# Patient Record
Sex: Female | Born: 1960
Health system: Southern US, Community
[De-identification: ages and names within clinical notes are randomized; demographics above are authoritative.]

## PROBLEM LIST (undated history)

## (undated) DIAGNOSIS — K219 Gastro-esophageal reflux disease without esophagitis: Secondary | ICD-10-CM

## (undated) DIAGNOSIS — F1410 Cocaine abuse, uncomplicated: Secondary | ICD-10-CM

## (undated) DIAGNOSIS — Z7289 Other problems related to lifestyle: Secondary | ICD-10-CM

## (undated) DIAGNOSIS — G629 Polyneuropathy, unspecified: Secondary | ICD-10-CM

## (undated) DIAGNOSIS — F32A Depression, unspecified: Secondary | ICD-10-CM

## (undated) DIAGNOSIS — Z720 Tobacco use: Secondary | ICD-10-CM

## (undated) DIAGNOSIS — F410 Panic disorder [episodic paroxysmal anxiety] without agoraphobia: Secondary | ICD-10-CM

## (undated) DIAGNOSIS — I499 Cardiac arrhythmia, unspecified: Secondary | ICD-10-CM

## (undated) DIAGNOSIS — M1990 Unspecified osteoarthritis, unspecified site: Secondary | ICD-10-CM

## (undated) DIAGNOSIS — F419 Anxiety disorder, unspecified: Secondary | ICD-10-CM

## (undated) DIAGNOSIS — J449 Chronic obstructive pulmonary disease, unspecified: Secondary | ICD-10-CM

## (undated) DIAGNOSIS — I480 Paroxysmal atrial fibrillation: Secondary | ICD-10-CM

## (undated) DIAGNOSIS — F1090 Alcohol use, unspecified, uncomplicated: Secondary | ICD-10-CM

## (undated) DIAGNOSIS — K5900 Constipation, unspecified: Secondary | ICD-10-CM

## (undated) DIAGNOSIS — E559 Vitamin D deficiency, unspecified: Secondary | ICD-10-CM

## (undated) DIAGNOSIS — F329 Major depressive disorder, single episode, unspecified: Secondary | ICD-10-CM

## (undated) DIAGNOSIS — Z789 Other specified health status: Secondary | ICD-10-CM

## (undated) DIAGNOSIS — I10 Essential (primary) hypertension: Secondary | ICD-10-CM

## (undated) DIAGNOSIS — J189 Pneumonia, unspecified organism: Secondary | ICD-10-CM

## (undated) DIAGNOSIS — E119 Type 2 diabetes mellitus without complications: Secondary | ICD-10-CM

## (undated) HISTORY — DX: Vitamin D deficiency, unspecified: E55.9

## (undated) HISTORY — DX: Cardiac arrhythmia, unspecified: I49.9

## (undated) HISTORY — PX: NO PAST SURGERIES: SHX2092

---

## 2004-05-19 ENCOUNTER — Emergency Department: Payer: Self-pay | Admitting: Emergency Medicine

## 2004-10-17 ENCOUNTER — Emergency Department: Payer: Self-pay | Admitting: Unknown Physician Specialty

## 2004-10-21 ENCOUNTER — Emergency Department: Payer: Self-pay | Admitting: Emergency Medicine

## 2004-10-23 ENCOUNTER — Emergency Department: Payer: Self-pay | Admitting: Emergency Medicine

## 2005-08-05 ENCOUNTER — Emergency Department: Payer: Self-pay | Admitting: Internal Medicine

## 2006-10-05 ENCOUNTER — Ambulatory Visit: Payer: Self-pay | Admitting: Nurse Practitioner

## 2006-10-07 ENCOUNTER — Ambulatory Visit: Payer: Self-pay | Admitting: *Deleted

## 2008-03-16 ENCOUNTER — Inpatient Hospital Stay: Payer: Self-pay | Admitting: Internal Medicine

## 2008-03-16 ENCOUNTER — Other Ambulatory Visit: Payer: Self-pay

## 2010-03-18 ENCOUNTER — Inpatient Hospital Stay: Payer: Self-pay | Admitting: Internal Medicine

## 2011-10-28 ENCOUNTER — Ambulatory Visit: Payer: Self-pay | Admitting: Primary Care

## 2012-09-21 ENCOUNTER — Emergency Department: Payer: Self-pay | Admitting: Emergency Medicine

## 2015-06-21 LAB — HM PAP SMEAR

## 2015-08-15 ENCOUNTER — Ambulatory Visit: Payer: Self-pay | Attending: Oncology

## 2016-04-07 ENCOUNTER — Ambulatory Visit
Admission: RE | Admit: 2016-04-07 | Discharge: 2016-04-07 | Disposition: A | Discharge status: Home / Self Care | Payer: Self-pay | Source: Ambulatory Visit | Attending: Oncology | Admitting: Oncology

## 2016-04-07 ENCOUNTER — Encounter (INDEPENDENT_AMBULATORY_CARE_PROVIDER_SITE_OTHER): Payer: Self-pay

## 2016-04-07 ENCOUNTER — Ambulatory Visit: Payer: Self-pay | Attending: Oncology

## 2016-04-07 VITALS — BP 138/77 | HR 97 | Temp 98.2°F | Ht 62.6 in | Wt 126.8 lb

## 2016-04-07 DIAGNOSIS — Z0000 Encounter for general adult medical examination without abnormal findings: Secondary | ICD-10-CM

## 2016-04-07 NOTE — Progress Notes (Signed)
Letter mailed from [facility] to notify of normal mammogram results.  Patient to return in one year for annual screening.  Copy to [facility]. 

## 2016-04-07 NOTE — Progress Notes (Signed)
Subjective:     Patient ID: Michaela Johnson, female   DOB: 1960/09/08, 55 y.o.   MRN: [id number]  HPI   Review of Systems     Objective:   Physical Exam  Pulmonary/Chest: Right breast exhibits no inverted nipple, no mass, no nipple discharge, no skin change and no tenderness. Left breast exhibits no inverted nipple, no mass, no nipple discharge, no skin change and no tenderness. Breasts are symmetrical.    Bilateral areolar indentation 6 o'clock when arms raised       Assessment:     55 year old patient with no previous mammogram. Patient screened, and meets BCCCP eligibility.  Patient does not have insurance, Medicare or Medicaid.  Handout given on Affordable Care Act.  Instructed patient on breast self-exam using teach back method.  CBE unremarkable.  No mass or lump palpated.  Noted lower areolar inversion bilateral when arms raised.  Patient states "has always done that."    Plan:     Sent for bilateral screening mammogram.

## 2016-05-05 DIAGNOSIS — I152 Hypertension secondary to endocrine disorders: Secondary | ICD-10-CM | POA: Diagnosis present

## 2016-05-05 DIAGNOSIS — I10 Essential (primary) hypertension: Secondary | ICD-10-CM | POA: Diagnosis present

## 2016-05-05 DIAGNOSIS — E1159 Type 2 diabetes mellitus with other circulatory complications: Secondary | ICD-10-CM | POA: Diagnosis present

## 2016-05-14 ENCOUNTER — Other Ambulatory Visit: Payer: Self-pay | Admitting: Internal Medicine

## 2016-05-14 DIAGNOSIS — R748 Abnormal levels of other serum enzymes: Secondary | ICD-10-CM

## 2016-06-07 ENCOUNTER — Encounter: Payer: Self-pay | Admitting: *Deleted

## 2016-06-07 ENCOUNTER — Inpatient Hospital Stay: Payer: Self-pay

## 2016-06-07 ENCOUNTER — Inpatient Hospital Stay
Admission: EM | Admit: 2016-06-07 | Discharge: 2016-06-10 | DRG: 637 | Disposition: A | Discharge status: Home / Self Care | Payer: Self-pay | Attending: Internal Medicine | Admitting: Internal Medicine

## 2016-06-07 DIAGNOSIS — Z794 Long term (current) use of insulin: Secondary | ICD-10-CM

## 2016-06-07 DIAGNOSIS — I10 Essential (primary) hypertension: Secondary | ICD-10-CM | POA: Diagnosis present

## 2016-06-07 DIAGNOSIS — K852 Alcohol induced acute pancreatitis without necrosis or infection: Secondary | ICD-10-CM | POA: Diagnosis present

## 2016-06-07 DIAGNOSIS — Z91128 Patient's intentional underdosing of medication regimen for other reason: Secondary | ICD-10-CM

## 2016-06-07 DIAGNOSIS — N179 Acute kidney failure, unspecified: Secondary | ICD-10-CM | POA: Diagnosis present

## 2016-06-07 DIAGNOSIS — R000 Tachycardia, unspecified: Secondary | ICD-10-CM | POA: Diagnosis not present

## 2016-06-07 DIAGNOSIS — Y92019 Unspecified place in single-family (private) house as the place of occurrence of the external cause: Secondary | ICD-10-CM

## 2016-06-07 DIAGNOSIS — F10288 Alcohol dependence with other alcohol-induced disorder: Secondary | ICD-10-CM | POA: Diagnosis present

## 2016-06-07 DIAGNOSIS — Z8249 Family history of ischemic heart disease and other diseases of the circulatory system: Secondary | ICD-10-CM

## 2016-06-07 DIAGNOSIS — I447 Left bundle-branch block, unspecified: Secondary | ICD-10-CM | POA: Diagnosis present

## 2016-06-07 DIAGNOSIS — E875 Hyperkalemia: Secondary | ICD-10-CM | POA: Diagnosis present

## 2016-06-07 DIAGNOSIS — F329 Major depressive disorder, single episode, unspecified: Secondary | ICD-10-CM | POA: Diagnosis present

## 2016-06-07 DIAGNOSIS — Z79899 Other long term (current) drug therapy: Secondary | ICD-10-CM

## 2016-06-07 DIAGNOSIS — R110 Nausea: Secondary | ICD-10-CM

## 2016-06-07 DIAGNOSIS — Z23 Encounter for immunization: Secondary | ICD-10-CM

## 2016-06-07 DIAGNOSIS — Z716 Tobacco abuse counseling: Secondary | ICD-10-CM

## 2016-06-07 DIAGNOSIS — F17200 Nicotine dependence, unspecified, uncomplicated: Secondary | ICD-10-CM | POA: Diagnosis present

## 2016-06-07 DIAGNOSIS — R1011 Right upper quadrant pain: Secondary | ICD-10-CM

## 2016-06-07 DIAGNOSIS — E1110 Type 2 diabetes mellitus with ketoacidosis without coma: Secondary | ICD-10-CM | POA: Diagnosis present

## 2016-06-07 DIAGNOSIS — K219 Gastro-esophageal reflux disease without esophagitis: Secondary | ICD-10-CM | POA: Diagnosis present

## 2016-06-07 DIAGNOSIS — E1010 Type 1 diabetes mellitus with ketoacidosis without coma: Principal | ICD-10-CM | POA: Diagnosis present

## 2016-06-07 DIAGNOSIS — R109 Unspecified abdominal pain: Secondary | ICD-10-CM

## 2016-06-07 DIAGNOSIS — T383X6A Underdosing of insulin and oral hypoglycemic [antidiabetic] drugs, initial encounter: Secondary | ICD-10-CM | POA: Diagnosis present

## 2016-06-07 HISTORY — DX: Type 2 diabetes mellitus without complications: E11.9

## 2016-06-07 HISTORY — DX: Essential (primary) hypertension: I10

## 2016-06-07 LAB — CBC
HCT: 36.6 % (ref 35.0–47.0)
HEMATOCRIT: 37.7 % (ref 35.0–47.0)
HEMOGLOBIN: 11.2 g/dL — AB (ref 12.0–16.0)
Hemoglobin: 10.8 g/dL — ABNORMAL LOW (ref 12.0–16.0)
MCH: 35.7 pg — AB (ref 26.0–34.0)
MCH: 35.3 pg — ABNORMAL HIGH (ref 26.0–34.0)
MCHC: 29.8 g/dL — AB (ref 32.0–36.0)
MCHC: 29.5 g/dL — AB (ref 32.0–36.0)
MCV: 119.7 fL — ABNORMAL HIGH (ref 80.0–100.0)
MCV: 119.9 fL — ABNORMAL HIGH (ref 80.0–100.0)
PLATELETS: 233 10*3/uL (ref 150–440)
Platelets: 247 10*3/uL (ref 150–440)
RBC: 3.15 MIL/uL — ABNORMAL LOW (ref 3.80–5.20)
RBC: 3.06 MIL/uL — ABNORMAL LOW (ref 3.80–5.20)
RDW: 14.7 % — ABNORMAL HIGH (ref 11.5–14.5)
RDW: 14.7 % — AB (ref 11.5–14.5)
WBC: 14.8 10*3/uL — ABNORMAL HIGH (ref 3.6–11.0)
WBC: 14.3 10*3/uL — AB (ref 3.6–11.0)

## 2016-06-07 LAB — TROPONIN I: Troponin I: <0.03 ng/mL (ref <0.03)

## 2016-06-07 LAB — COMPREHENSIVE METABOLIC PANEL
ALK PHOS: 122 U/L (ref 38–126)
ALT: 34 U/L (ref 14–54)
ANION GAP: UNDETERMINED (ref 5–15)
AST: 48 U/L — ABNORMAL HIGH (ref 15–41)
Albumin: 3.9 g/dL (ref 3.5–5.0)
BILIRUBIN TOTAL: 2.1 mg/dL — AB (ref 0.3–1.2)
BUN: 28 mg/dL — ABNORMAL HIGH (ref 6–20)
CALCIUM: 8.6 mg/dL — AB (ref 8.9–10.3)
CO2: <7 mmol/L — ABNORMAL LOW (ref 22–32)
Chloride: 82 mmol/L — ABNORMAL LOW (ref 101–111)
Creatinine, Ser: 1.5 mg/dL — ABNORMAL HIGH (ref 0.44–1.00)
GFR, EST AFRICAN AMERICAN: 44 mL/min — AB (ref >60)
GFR, EST NON AFRICAN AMERICAN: 38 mL/min — AB (ref >60)
GLUCOSE: 893 mg/dL — AB (ref 65–99)
Potassium: 6.1 mmol/L — ABNORMAL HIGH (ref 3.5–5.1)
Sodium: 118 mmol/L — CL (ref 135–145)
TOTAL PROTEIN: 7.9 g/dL (ref 6.5–8.1)

## 2016-06-07 LAB — BASIC METABOLIC PANEL
ANION GAP: 23 — AB (ref 5–15)
BUN: 28 mg/dL — ABNORMAL HIGH (ref 6–20)
BUN: 29 mg/dL — ABNORMAL HIGH (ref 6–20)
CALCIUM: 7.7 mg/dL — AB (ref 8.9–10.3)
CALCIUM: 7.3 mg/dL — AB (ref 8.9–10.3)
CO2: 8 mmol/L — AB (ref 22–32)
CREATININE: 1.41 mg/dL — AB (ref 0.44–1.00)
CREATININE: 1.57 mg/dL — AB (ref 0.44–1.00)
Chloride: 89 mmol/L — ABNORMAL LOW (ref 101–111)
Chloride: 96 mmol/L — ABNORMAL LOW (ref 101–111)
GFR, EST AFRICAN AMERICAN: 47 mL/min — AB (ref >60)
GFR, EST AFRICAN AMERICAN: 41 mL/min — AB (ref >60)
GFR, EST NON AFRICAN AMERICAN: 40 mL/min — AB (ref >60)
GFR, EST NON AFRICAN AMERICAN: 36 mL/min — AB (ref >60)
Glucose, Bld: 882 mg/dL (ref 65–99)
Glucose, Bld: 351 mg/dL — ABNORMAL HIGH (ref 65–99)
Potassium: 5.9 mmol/L — ABNORMAL HIGH (ref 3.5–5.1)
Potassium: 3.3 mmol/L — ABNORMAL LOW (ref 3.5–5.1)
SODIUM: 122 mmol/L — AB (ref 135–145)
SODIUM: 127 mmol/L — AB (ref 135–145)

## 2016-06-07 LAB — GLUCOSE, CAPILLARY
GLUCOSE-CAPILLARY: 259 mg/dL — AB (ref 65–99)
Glucose-Capillary: 195 mg/dL — ABNORMAL HIGH (ref 65–99)
Glucose-Capillary: 405 mg/dL — ABNORMAL HIGH (ref 65–99)
Glucose-Capillary: 480 mg/dL — ABNORMAL HIGH (ref 65–99)
Glucose-Capillary: 508 mg/dL (ref 65–99)
Glucose-Capillary: >600 mg/dL (ref 65–99)
Glucose-Capillary: >600 mg/dL (ref 65–99)
Glucose-Capillary: >600 mg/dL (ref 65–99)
Glucose-Capillary: >600 mg/dL (ref 65–99)

## 2016-06-07 LAB — LIPASE, BLOOD: LIPASE: 639 U/L — AB (ref 11–51)

## 2016-06-07 LAB — MRSA PCR SCREENING: MRSA by PCR: NEGATIVE

## 2016-06-07 MED ORDER — PANTOPRAZOLE SODIUM 40 MG PO TBEC
40.0000 mg | DELAYED_RELEASE_TABLET | Freq: Every day | ORAL | Status: DC
  Administered 2016-06-08 – 2016-06-10 (×3): 40 mg via ORAL
  Filled 2016-06-07 (×3): qty 1

## 2016-06-07 MED ORDER — MORPHINE SULFATE (PF) 4 MG/ML IV SOLN: 1.0000 mg | INTRAVENOUS | Status: DC | PRN

## 2016-06-07 MED ORDER — CITALOPRAM HYDROBROMIDE 20 MG PO TABS
20.0000 mg | ORAL_TABLET | Freq: Every day | ORAL | Status: DC
  Administered 2016-06-08 – 2016-06-10 (×3): 20 mg via ORAL
  Filled 2016-06-07 (×3): qty 1

## 2016-06-07 MED ORDER — DILTIAZEM LOAD VIA INFUSION
10.0000 mg | Freq: Once | INTRAVENOUS | Status: AC
  Administered 2016-06-07: 10 mg via INTRAVENOUS
  Filled 2016-06-07: qty 10

## 2016-06-07 MED ORDER — LORAZEPAM 0.5 MG PO TABS: 1.0000 mg | ORAL_TABLET | Freq: Four times a day (QID) | ORAL | Status: DC | PRN

## 2016-06-07 MED ORDER — SODIUM CHLORIDE 0.9% FLUSH
3.0000 mL | Freq: Two times a day (BID) | INTRAVENOUS | Status: DC
  Administered 2016-06-07 – 2016-06-10 (×6): 3 mL via INTRAVENOUS

## 2016-06-07 MED ORDER — LORAZEPAM 2 MG PO TABS: 0.0000 mg | ORAL_TABLET | Freq: Two times a day (BID) | ORAL | Status: DC

## 2016-06-07 MED ORDER — DILTIAZEM HCL 100 MG IV SOLR
5.0000 mg/h | INTRAVENOUS | Status: DC
  Administered 2016-06-07 – 2016-06-09 (×2): 5 mg/h via INTRAVENOUS
  Filled 2016-06-07 (×3): qty 100

## 2016-06-07 MED ORDER — ADULT MULTIVITAMIN W/MINERALS CH
1.0000 | ORAL_TABLET | Freq: Every day | ORAL | Status: DC
  Administered 2016-06-08 – 2016-06-10 (×3): 1 via ORAL
  Filled 2016-06-07 (×3): qty 1

## 2016-06-07 MED ORDER — SODIUM CHLORIDE 0.9 % IV SOLN
INTRAVENOUS | Status: DC
Start: 2016-06-07 — End: 2016-06-08
  Administered 2016-06-07 (×2): via INTRAVENOUS

## 2016-06-07 MED ORDER — SODIUM CHLORIDE 0.9 % IV BOLUS (SEPSIS)
1000.0000 mL | Freq: Once | INTRAVENOUS | Status: AC
  Administered 2016-06-07: 1000 mL via INTRAVENOUS

## 2016-06-07 MED ORDER — ENOXAPARIN SODIUM 40 MG/0.4ML ~~LOC~~ SOLN
40.0000 mg | SUBCUTANEOUS | Status: DC
  Administered 2016-06-07 – 2016-06-09 (×3): 40 mg via SUBCUTANEOUS
  Filled 2016-06-07 (×3): qty 0.4

## 2016-06-07 MED ORDER — IOPAMIDOL (ISOVUE-300) INJECTION 61%
75.0000 mL | Freq: Once | INTRAVENOUS | Status: AC | PRN
  Administered 2016-06-07: 75 mL via INTRAVENOUS

## 2016-06-07 MED ORDER — SODIUM CHLORIDE 0.9 % IV SOLN: INTRAVENOUS | Status: AC

## 2016-06-07 MED ORDER — ONDANSETRON HCL 4 MG/2ML IJ SOLN
4.0000 mg | Freq: Four times a day (QID) | INTRAMUSCULAR | Status: DC | PRN
  Administered 2016-06-07: 4 mg via INTRAVENOUS
  Filled 2016-06-07: qty 2

## 2016-06-07 MED ORDER — STERILE WATER FOR INJECTION IV SOLN
INTRAVENOUS | Status: DC
  Administered 2016-06-07 (×2): via INTRAVENOUS
  Filled 2016-06-07 (×6): qty 850

## 2016-06-07 MED ORDER — LORAZEPAM 2 MG/ML IJ SOLN
1.0000 mg | Freq: Four times a day (QID) | INTRAMUSCULAR | Status: DC | PRN
  Administered 2016-06-08: 1 mg via INTRAVENOUS
  Filled 2016-06-07: qty 1

## 2016-06-07 MED ORDER — SODIUM CHLORIDE 0.9 % IV SOLN
INTRAVENOUS | Status: DC
  Administered 2016-06-07: 5.4 [IU]/h via INTRAVENOUS
  Filled 2016-06-07: qty 2.5

## 2016-06-07 MED ORDER — SODIUM CHLORIDE 0.9 % IV SOLN
INTRAVENOUS | Status: DC
  Administered 2016-06-07: 16.2 [IU]/h via INTRAVENOUS
  Administered 2016-06-08: 3.9 [IU]/h via INTRAVENOUS
  Filled 2016-06-07: qty 2.5

## 2016-06-07 MED ORDER — VITAMIN B-1 100 MG PO TABS
100.0000 mg | ORAL_TABLET | Freq: Every day | ORAL | Status: DC
  Administered 2016-06-08 – 2016-06-10 (×3): 100 mg via ORAL
  Filled 2016-06-07 (×3): qty 1

## 2016-06-07 MED ORDER — ACETAMINOPHEN 325 MG PO TABS: 650.0000 mg | ORAL_TABLET | Freq: Four times a day (QID) | ORAL | Status: DC | PRN

## 2016-06-07 MED ORDER — ACETAMINOPHEN 650 MG RE SUPP: 650.0000 mg | Freq: Four times a day (QID) | RECTAL | Status: DC | PRN

## 2016-06-07 MED ORDER — THIAMINE HCL 100 MG/ML IJ SOLN
100.0000 mg | Freq: Every day | INTRAMUSCULAR | Status: DC
  Filled 2016-06-07 (×3): qty 2

## 2016-06-07 MED ORDER — PIPERACILLIN-TAZOBACTAM 3.375 G IVPB
3.3750 g | Freq: Three times a day (TID) | INTRAVENOUS | Status: DC
  Administered 2016-06-07 – 2016-06-09 (×6): 3.375 g via INTRAVENOUS
  Filled 2016-06-07 (×6): qty 50

## 2016-06-07 MED ORDER — SODIUM BICARBONATE 8.4 % IV SOLN
INTRAVENOUS | Status: AC
  Administered 2016-06-07: 50 meq via INTRAVENOUS
  Filled 2016-06-07: qty 50

## 2016-06-07 MED ORDER — SODIUM CHLORIDE 0.9 % IV BOLUS (SEPSIS)
1000.0000 mL | Freq: Once | INTRAVENOUS | Status: AC
Start: 2016-06-07 — End: 2016-06-07
  Administered 2016-06-07: 1000 mL via INTRAVENOUS

## 2016-06-07 MED ORDER — DIGOXIN 0.25 MG/ML IJ SOLN
0.2500 mg | Freq: Once | INTRAMUSCULAR | Status: AC
Start: 2016-06-07 — End: 2016-06-07
  Administered 2016-06-07: 0.25 mg via INTRAVENOUS
  Filled 2016-06-07: qty 2

## 2016-06-07 MED ORDER — ENOXAPARIN SODIUM 40 MG/0.4ML ~~LOC~~ SOLN: 40.0000 mg | SUBCUTANEOUS | Status: DC

## 2016-06-07 MED ORDER — FOLIC ACID 1 MG PO TABS
1.0000 mg | ORAL_TABLET | Freq: Every day | ORAL | Status: DC
  Administered 2016-06-08 – 2016-06-10 (×3): 1 mg via ORAL
  Filled 2016-06-07 (×3): qty 1

## 2016-06-07 MED ORDER — DEXTROSE-NACL 5-0.45 % IV SOLN
INTRAVENOUS | Status: DC
Start: 2016-06-07 — End: 2016-06-07

## 2016-06-07 MED ORDER — DEXTROSE-NACL 5-0.45 % IV SOLN
INTRAVENOUS | Status: DC
  Administered 2016-06-08: 03:00:00 via INTRAVENOUS

## 2016-06-07 MED ORDER — TRAMADOL HCL 50 MG PO TABS: 50.0000 mg | ORAL_TABLET | Freq: Four times a day (QID) | ORAL | Status: DC | PRN

## 2016-06-07 MED ORDER — LORAZEPAM 2 MG PO TABS
0.0000 mg | ORAL_TABLET | Freq: Four times a day (QID) | ORAL | Status: AC
  Administered 2016-06-07: 2 mg via ORAL
  Filled 2016-06-07: qty 1

## 2016-06-07 MED ORDER — SODIUM BICARBONATE 8.4 % IV SOLN
50.0000 meq | Freq: Once | INTRAVENOUS | Status: AC
  Administered 2016-06-07: 50 meq via INTRAVENOUS

## 2016-06-07 MED ORDER — SODIUM CHLORIDE 0.9 % IV BOLUS (SEPSIS)
500.0000 mL | Freq: Once | INTRAVENOUS | Status: AC
  Administered 2016-06-07: 500 mL via INTRAVENOUS

## 2016-06-07 NOTE — Progress Notes (Signed)
CT SHOWS probable pancreatitis ???acute choley.  Will order RUQ u/s Empiric tx for acute choley until RUQ ABD [place] results back

## 2016-06-07 NOTE — ED Triage Notes (Signed)
Pt arrived to ED via EMS from home reporting NVD x 3 days. Pt is diabetic who repots not having taken insulin because she has not been eating due to vomiting. CBG per EMS was 589. 300 mL NS given in route. PT also given 4 mg Zofran. Pt presents to ED not following commands and confused.  Hx of HTN and diabetes.

## 2016-06-07 NOTE — Progress Notes (Addendum)
Inpatient Diabetes Program Recommendations  AACE/ADA: New Consensus Statement on Inpatient Glycemic Control (2015)  Target Ranges:  Prepandial:   less than 140 mg/dL      Peak postprandial:   less than 180 mg/dL (1-2 hours)      Critically ill patients:  140 - 180 mg/dL   Results for Michaela Johnson, Michaela Johnson (MRN [id number]) as of 06/07/2016 14:03  Ref. Range 06/07/2016 11:39  Sodium Latest Ref Range: 135 - 145 mmol/L 118 (LL)  Potassium Latest Ref Range: 3.5 - 5.1 mmol/L 6.1 (H)  Chloride Latest Ref Range: 101 - 111 mmol/L 82 (L)  CO2 Latest Ref Range: 22 - 32 mmol/L <7 (L)  BUN Latest Ref Range: 6 - 20 mg/dL 28 (H)  Creatinine Latest Ref Range: 0.44 - 1.00 mg/dL 1.50 (H)  Calcium Latest Ref Range: 8.9 - 10.3 mg/dL 8.6 (L)  EGFR (Non-African Amer.) Latest Ref Range: >60 mL/min 38 (L)  EGFR (African American) Latest Ref Range: >60 mL/min 44 (L)  Glucose Latest Ref Range: 65 - 99 mg/dL 893 (HH)  Anion gap Latest Ref Range: 5 - 15  UNABLE TO CALCULATE.   Review of Glycemic Control  Diabetes history: DM 2 Outpatient Diabetes medications: Levemir 40 units per chart review, Novolog 5 units TID meal coverage Current orders for Inpatient glycemic control: IV insulin  Inpatient Diabetes Program Recommendations:   Patient in DKA, experienced sickness and did not take insulin. Sick day guidelines attached to discharge paperwork for patient review.   When acidosis clears, please consider placing patient on at least Levemir 20 units Q 24hours (half of patients home dose) and Novolog Sensitive Correction (0-9 units) TID + Novolog HS coverage (0-5 units).  Thanks,  Tama Headings RN, MSN, [facility] Inpatient Diabetes Coordinator Team Pager [phone] (8a-5p)

## 2016-06-07 NOTE — Progress Notes (Signed)
Pharmacy Antibiotic Note  Michaela Johnson is a 54 y.o. female admitted on 06/07/2016 with DKA and intra-abdominal infection.  Pharmacy has been consulted for piperacillin/tazobactam dosing. Per MD note, CT shows possible pancreatitis ?acute cholecystitis   Plan: Piperacillin/tazobactam 3.375 IV q 8 hours  Weight: 128 lb (58.1 kg)  Temp (24hrs), Avg:97.5 F (36.4 C), Min:97.5 F (36.4 C), Max:97.5 F (36.4 C)   Recent Labs Lab 06/07/16 1139 06/07/16 1436  WBC 14.8* 14.3*  CREATININE 1.50* 1.41*    Estimated Creatinine Clearance: 35.8 mL/min (by C-G formula based on SCr of 1.41 mg/dL (H)).    No Known Allergies  Antimicrobials this admission: Piperacillin/tazobactam 11/18 >>   Dose adjustments this admission:   Microbiology results:  BCx:   UCx:    Sputum:    MRSA PCR:   Thank you for allowing pharmacy to be a part of this patient's care.  Darrow Bussing, PharmD Pharmacy Resident 06/07/2016 5:02 PM

## 2016-06-07 NOTE — Progress Notes (Signed)
Cardizem 10 mg IVP given as ordered

## 2016-06-07 NOTE — Progress Notes (Signed)
Cardizem Drip started

## 2016-06-07 NOTE — Progress Notes (Signed)
Pt's heart rate maintaining in upper 130's and low 140's.  Dr. Doy Hutching called, and informed of patients rate and a-Fib.  Order for 500cc NS, will revaluate, after bolus.

## 2016-06-07 NOTE — ED Provider Notes (Signed)
[facility] Emergency Department Provider Note  Time seen: 1:35 PM  I have reviewed the triage vital signs and the nursing notes.   HISTORY  Chief Complaint Hyperglycemia and Emesis    HPI Michaela Johnson is a 55 y.o. female with a past medical history of diabetes and hypertension who presents to the emergency department with nausea, vomiting, altered mental status. According to the patient over the past 3 days she has been nauseated and vomiting, has not been taking her insulin because she is not eating anything. Patient appears somewhat confused, drowsy, difficult to answer questions but she is oriented 3. Patient states mild diffuse abdominal cramping, significant vomiting. Patient looks extremely dry on examination with very dry mucous membranes.  Past Medical History:  Diagnosis Date  . Diabetes mellitus without complication ([place])   . Hypertension     There are no active problems to display for this patient.   No past surgical history on file.  Prior to Admission medications   Medication Sig Start Date End Date Taking? Authorizing Provider  citalopram (CELEXA) 20 MG tablet Take 1 tablet by mouth daily. 05/13/16 05/13/17 Yes Historical Provider, MD  enalapril (VASOTEC) 20 MG tablet Take 1 tablet by mouth daily.   Yes Historical Provider, MD  insulin aspart (NOVOLOG) 100 UNIT/ML FlexPen Inject 5 Units into the skin 3 (three) times daily with meals.   Yes Historical Provider, MD  Insulin Detemir (LEVEMIR FLEXPEN [place]) Inject into the skin.   Yes Historical Provider, MD  Multiple Vitamin (MULTI-VITAMINS) TABS Take 1 tablet by mouth daily.   Yes Historical Provider, MD  Omega-3 Fatty Acids (FISH OIL PO) Take 1 capsule by mouth daily.   Yes Historical Provider, MD  omeprazole (PRILOSEC) 20 MG capsule Take 1 capsule by mouth daily.   Yes Historical Provider, MD  Vitamin D, Ergocalciferol, (DRISDOL) 50000 units CAPS capsule Take 1 capsule by mouth once a week.  05/13/16 06/12/16 Yes Historical Provider, MD    Not on File  No family history on file.  Social History Social History  Substance Use Topics  . Smoking status: Not on file  . Smokeless tobacco: Not on file  . Alcohol use Not on file    Review of Systems Constitutional: Negative for fever. Cardiovascular: Negative for chest pain. Respiratory: Negative for shortness of breath. Gastrointestinal: Mild diffuse abdominal pain. Other for nausea and vomiting. Genitourinary: Negative for dysuria. Neurological: Negative for headache 10-point ROS otherwise negative.  ____________________________________________   PHYSICAL EXAM:  VITAL SIGNS: ED Triage Vitals  Enc Vitals Group     BP 06/07/16 1137 121/67     Pulse Rate 06/07/16 1137 (!) 107     Resp 06/07/16 1137 (!) 22     Temp --      Temp src --      SpO2 06/07/16 1137 100 %     Weight 06/07/16 1141 128 lb (58.1 kg)     Height --      Head Circumference --      Peak Flow --      Pain Score --      Pain Loc --      Pain Edu? --      Excl. in [place]? --     Constitutional: Alert and oriented. Somewhat somnolent, confused at times. Eyes: Normal exam ENT   Head: Normocephalic and atraumatic.   Mouth/Throat: Very dry mucous membranes. Cardiovascular: Normal rate, regular rhythm. No murmur Respiratory: Normal respiratory effort without tachypnea nor retractions.  Breath sounds are clear  Gastrointestinal: Soft and nontender. No distention.   Musculoskeletal: Nontender with normal range of motion in all extremities.  Neurologic:  Normal speech and language. No gross focal neurologic deficits  Skin:  Skin is warm, dry and intact.  Psychiatric: Patient is confused acting, somewhat somnolent.  ____________________________________________    EKG  EKG reviewed and interpreted by myself shows sinus tachycardia at 107 bpm, widened QRS, normal axis, prolonged QTC, nonspecific ST changes, most consistent left bundle branch  block.  EKG #2 reviewed and interpreted by myself 12:02:02 shows sinus tachycardia 106 bpm, one QRS, normal axis, continued prolonged QTC nonspecific ST changes.  ____________________________________________   INITIAL IMPRESSION / ASSESSMENT AND PLAN / ED COURSE  Pertinent labs & imaging results that were available during my care of the patient were reviewed by me and considered in my medical decision making (see chart for details).  Patient presents the emergency department with significant dry mucous membranes, nausea, vomiting, altered at times but is oriented 3. Patient's labs are consistent with severe DKA with a pH less than 6.9 glucose of 893. Potassium 6.1. Patient will be started on an insulin drip. Patient received 1 amp of bicarbonate. Patient receiving IV fluids. Patient will likely require ICU/stepdown admission.   CRITICAL CARE Performed by: Harvest Dark   Total critical care time: 60 minutes  Critical care time was exclusive of separately billable procedures and treating other patients.  Critical care was necessary to treat or prevent imminent or life-threatening deterioration.  Critical care was time spent personally by me on the following activities: development of treatment plan with patient and/or surrogate as well as nursing, discussions with consultants, evaluation of patient's response to treatment, examination of patient, obtaining history from patient or surrogate, ordering and performing treatments and interventions, ordering and review of laboratory studies, ordering and review of radiographic studies, pulse oximetry and re-evaluation of patient's condition.   ____________________________________________   FINAL CLINICAL IMPRESSION(S) / ED DIAGNOSES  DKA    Harvest Dark, MD 06/07/16 [phone]

## 2016-06-07 NOTE — H&P (Addendum)
[name] [place] NAME: Michaela Johnson    MR#:  [id number]  DATE OF BIRTH:  12/17/1960  DATE OF ADMISSION:  06/07/2016  PRIMARY CARE PHYSICIAN: [name], NP   REQUESTING/REFERRING PHYSICIAN:  Dr Kerman Passey  CHIEF COMPLAINT:   Abdominal pain with nausea and vomiting  HISTORY OF PRESENT ILLNESS:  Michaela Johnson  is a 55 y.o. female with a known history of Insulin requiring diabetes and hypertension who presents with above complaint. Over the past several days patient has had diarrhea, nausea and vomiting. She is unable to take her insulin. She is somewhat confused and drowsy. She is complaining of abdominal pain. She has not had any vomiting in the emergency room. Laboratory findings show severe DKA with a pH less than 7 and bicarbonate less than 7.  PAST MEDICAL HISTORY:   Past Medical History:  Diagnosis Date  . Diabetes mellitus without complication ([place])   . Hypertension     PAST SURGICAL HISTORY:  None reported  SOCIAL HISTORY:   She reports she smokes one pack a day and drinks 4-5 cans of beer a day  FAMILY HISTORY:  Father with heart failure mother with no known problems  DRUG ALLERGIES:  No known drug allergies  REVIEW OF SYSTEMS:   Review of Systems  Constitutional: Positive for malaise/fatigue. Negative for chills and fever.  HENT: Negative.  Negative for ear discharge, ear pain, hearing loss, nosebleeds and sore throat.   Eyes: Negative.  Negative for blurred vision and pain.  Respiratory: Negative.  Negative for cough, hemoptysis, shortness of breath and wheezing.   Cardiovascular: Negative.  Negative for chest pain, palpitations and leg swelling.  Gastrointestinal: Positive for abdominal pain, diarrhea, nausea and vomiting. Negative for blood in stool.  Genitourinary: Negative.  Negative for dysuria.  Musculoskeletal: Negative.  Negative for back pain.  Skin: Negative.   Neurological: Positive for  weakness. Negative for dizziness, tremors, speech change, focal weakness, seizures and headaches.  Endo/Heme/Allergies: Negative.  Does not bruise/bleed easily.  Psychiatric/Behavioral: Positive for depression, memory loss and substance abuse. Negative for hallucinations and suicidal ideas.    MEDICATIONS AT HOME:   Prior to Admission medications   Medication Sig Start Date End Date Taking? Authorizing Provider  citalopram (CELEXA) 20 MG tablet Take 1 tablet by mouth daily. 05/13/16 05/13/17 Yes Historical Provider, MD  enalapril (VASOTEC) 20 MG tablet Take 1 tablet by mouth daily.   Yes Historical Provider, MD  insulin aspart (NOVOLOG) 100 UNIT/ML FlexPen Inject 5 Units into the skin 3 (three) times daily with meals.   Yes Historical Provider, MD  Insulin Detemir (LEVEMIR FLEXPEN [place]) Inject into the skin.   Yes Historical Provider, MD  Multiple Vitamin (MULTI-VITAMINS) TABS Take 1 tablet by mouth daily.   Yes Historical Provider, MD  Omega-3 Fatty Acids (FISH OIL PO) Take 1 capsule by mouth daily.   Yes Historical Provider, MD  omeprazole (PRILOSEC) 20 MG capsule Take 1 capsule by mouth daily.   Yes Historical Provider, MD  Vitamin D, Ergocalciferol, (DRISDOL) 50000 units CAPS capsule Take 1 capsule by mouth once a week. 05/13/16 06/12/16 Yes Historical Provider, MD      VITAL SIGNS:  Blood pressure 121/67, pulse (!) 107, resp. rate (!) 22, weight 58.1 kg (128 lb), SpO2 100 %.  PHYSICAL EXAMINATION:   Physical Exam  Constitutional: She is oriented to person, place, and time. She appears distressed.  uncomfortable  HENT:  Head: Normocephalic.  Dry mucus membranes  Eyes: No scleral icterus.  Neck: Normal range of motion. Neck supple. No JVD present. No tracheal deviation present.  Cardiovascular: Regular rhythm and normal heart sounds.  Exam reveals no gallop and no friction rub.   No murmur heard. tachy  Pulmonary/Chest: Effort normal and breath sounds normal. No respiratory  distress. She has no wheezes. She has no rales. She exhibits no tenderness.  Abdominal: Soft. Bowel sounds are normal. She exhibits no distension and no mass. There is tenderness. There is no rebound and no guarding.  Musculoskeletal: Normal range of motion. She exhibits tenderness and deformity. She exhibits no edema.  Lymphadenopathy:    She has no cervical adenopathy.  Neurological: She is alert and oriented to person, place, and time.  Skin: Skin is warm. No rash noted. No erythema.  Psychiatric:  Drowsy but answers all questions      LABORATORY PANEL:   CBC  Recent Labs Lab 06/07/16 1139  WBC 14.8*  HGB 11.2*  HCT 37.7  PLT 247   ------------------------------------------------------------------------------------------------------------------  Chemistries   Recent Labs Lab 06/07/16 1139  NA 118*  K 6.1*  CL 82*  CO2 <7*  GLUCOSE 893*  BUN 28*  CREATININE 1.50*  CALCIUM 8.6*  AST 48*  ALT 34  ALKPHOS 122  BILITOT 2.1*   ------------------------------------------------------------------------------------------------------------------  Cardiac Enzymes  Recent Labs Lab 06/07/16 1139  TROPONINI <0.03   ------------------------------------------------------------------------------------------------------------------  RADIOLOGY:  No results found.  EKG:  Sinus tachycardia LBBB  IMPRESSION AND PLAN:   55 y/o female with Insulin-dependent diabetes and essential hypertension who presents in DKA.  1. Severe DKA with bicarbonate less than 7 and pH is less than 7.0 Patient will need HCO3 drip in addition to DKA protocol due to low pH and bicarbonate Patient will need aggressive IV fluid and monitoring of blood sugars. BMP every 6 hours  2. Abdominal pain with nausea and vomiting: KUB ordered and lipase. CT scan to evaluate for pancreatitis.   3. Diarrhea: This has resolved  4. Nausea and vomiting most likely due to DKA  5. EtOH abuse: CIWA  protocol  6. Hyperkalemia: Treated in the emergency room with insulin, dextrose Repeat potassium in 2 hours 7. Acute kidney injury in the setting of DKA: Continue IV fluids and recheck in a.m.  8. Tobacco depends: Patient is highly encouraged to stop smoking. Patient counseled for 3 minutes. Patient reports she will not quit smoking this time.  All the records are reviewed and case discussed with ED provider. Management plans discussed with the patient and she is in agreement  CODE STATUS: FULL  Critical care TOTAL TIME TAKING CARE OF THIS PATIENT: 55 minutes.    Patient is critically ill with severe acidosis due to severe DKA and hyperkalemia  Kyrstin Campillo M.D on 06/07/2016 at 1:49 PM  Between 7am to 6pm - Pager - [phone]  After 6pm go to [email or web address] - password [email or web address] Michaela Johnson  Office  [phone]  CC: Primary care physician; [name], NP

## 2016-06-08 ENCOUNTER — Inpatient Hospital Stay: Payer: Self-pay

## 2016-06-08 LAB — URINALYSIS COMPLETE WITH MICROSCOPIC (ARMC ONLY)
BACTERIA UA: NONE SEEN
Bilirubin Urine: NEGATIVE
Glucose, UA: >500 mg/dL — AB
LEUKOCYTES UA: NEGATIVE
NITRITE: NEGATIVE
PH: 5 (ref 5.0–8.0)
PROTEIN: 100 mg/dL — AB
SPECIFIC GRAVITY, URINE: 1.022 (ref 1.005–1.030)

## 2016-06-08 LAB — BASIC METABOLIC PANEL
ANION GAP: 9 (ref 5–15)
Anion gap: 13 (ref 5–15)
Anion gap: 7 (ref 5–15)
BUN: 27 mg/dL — AB (ref 6–20)
BUN: 22 mg/dL — ABNORMAL HIGH (ref 6–20)
BUN: 18 mg/dL (ref 6–20)
CALCIUM: 7.4 mg/dL — AB (ref 8.9–10.3)
CALCIUM: 7.6 mg/dL — AB (ref 8.9–10.3)
CO2: 22 mmol/L (ref 22–32)
CO2: 27 mmol/L (ref 22–32)
CO2: 29 mmol/L (ref 22–32)
CREATININE: 1.26 mg/dL — AB (ref 0.44–1.00)
Calcium: 7.4 mg/dL — ABNORMAL LOW (ref 8.9–10.3)
Chloride: 96 mmol/L — ABNORMAL LOW (ref 101–111)
Chloride: 97 mmol/L — ABNORMAL LOW (ref 101–111)
Chloride: 99 mmol/L — ABNORMAL LOW (ref 101–111)
Creatinine, Ser: 0.93 mg/dL (ref 0.44–1.00)
Creatinine, Ser: 0.83 mg/dL (ref 0.44–1.00)
GFR calc Af Amer: 54 mL/min — ABNORMAL LOW (ref >60)
GFR calc Af Amer: >60 mL/min (ref >60)
GFR calc non Af Amer: >60 mL/min (ref >60)
GFR, EST NON AFRICAN AMERICAN: 46 mL/min — AB (ref >60)
GLUCOSE: 127 mg/dL — AB (ref 65–99)
Glucose, Bld: 128 mg/dL — ABNORMAL HIGH (ref 65–99)
Glucose, Bld: 94 mg/dL (ref 65–99)
POTASSIUM: 2.9 mmol/L — AB (ref 3.5–5.1)
Potassium: 3.4 mmol/L — ABNORMAL LOW (ref 3.5–5.1)
Potassium: 3.1 mmol/L — ABNORMAL LOW (ref 3.5–5.1)
SODIUM: 131 mmol/L — AB (ref 135–145)
SODIUM: 133 mmol/L — AB (ref 135–145)
Sodium: 135 mmol/L (ref 135–145)

## 2016-06-08 LAB — HEMOGLOBIN A1C
HEMOGLOBIN A1C: 9.4 % — AB (ref 4.8–5.6)
Mean Plasma Glucose: 223 mg/dL

## 2016-06-08 LAB — BASIC METABOLIC PANEL WITH GFR
Anion gap: 11 (ref 5–15)
BUN: 24 mg/dL — ABNORMAL HIGH (ref 6–20)
CO2: 24 mmol/L (ref 22–32)
Calcium: 7.6 mg/dL — ABNORMAL LOW (ref 8.9–10.3)
Chloride: 97 mmol/L — ABNORMAL LOW (ref 101–111)
Creatinine, Ser: 1.08 mg/dL — ABNORMAL HIGH (ref 0.44–1.00)
GFR calc Af Amer: >60 mL/min
GFR calc non Af Amer: 56 mL/min — ABNORMAL LOW
Glucose, Bld: 135 mg/dL — ABNORMAL HIGH (ref 65–99)
Potassium: 3.4 mmol/L — ABNORMAL LOW (ref 3.5–5.1)
Sodium: 132 mmol/L — ABNORMAL LOW (ref 135–145)

## 2016-06-08 LAB — CBC
HCT: 29.1 % — ABNORMAL LOW (ref 35.0–47.0)
Hemoglobin: 10.8 g/dL — ABNORMAL LOW (ref 12.0–16.0)
MCH: 36.9 pg — ABNORMAL HIGH (ref 26.0–34.0)
MCHC: 36.9 g/dL — ABNORMAL HIGH (ref 32.0–36.0)
MCV: 99.9 fL (ref 80.0–100.0)
Platelets: 187 10*3/uL (ref 150–440)
RBC: 2.91 MIL/uL — ABNORMAL LOW (ref 3.80–5.20)
RDW: 11.9 % (ref 11.5–14.5)
WBC: 15.9 10*3/uL — ABNORMAL HIGH (ref 3.6–11.0)

## 2016-06-08 LAB — GLUCOSE, CAPILLARY
GLUCOSE-CAPILLARY: 148 mg/dL — AB (ref 65–99)
GLUCOSE-CAPILLARY: 127 mg/dL — AB (ref 65–99)
GLUCOSE-CAPILLARY: 140 mg/dL — AB (ref 65–99)
GLUCOSE-CAPILLARY: 136 mg/dL — AB (ref 65–99)
Glucose-Capillary: 134 mg/dL — ABNORMAL HIGH (ref 65–99)
Glucose-Capillary: 143 mg/dL — ABNORMAL HIGH (ref 65–99)
Glucose-Capillary: 160 mg/dL — ABNORMAL HIGH (ref 65–99)
Glucose-Capillary: 146 mg/dL — ABNORMAL HIGH (ref 65–99)
Glucose-Capillary: 125 mg/dL — ABNORMAL HIGH (ref 65–99)
Glucose-Capillary: 117 mg/dL — ABNORMAL HIGH (ref 65–99)
Glucose-Capillary: 133 mg/dL — ABNORMAL HIGH (ref 65–99)
Glucose-Capillary: 110 mg/dL — ABNORMAL HIGH (ref 65–99)
Glucose-Capillary: 74 mg/dL (ref 65–99)
Glucose-Capillary: 82 mg/dL (ref 65–99)
Glucose-Capillary: 144 mg/dL — ABNORMAL HIGH (ref 65–99)

## 2016-06-08 LAB — BLOOD GAS, VENOUS
PCO2 VEN: 19 mmHg — AB (ref 44.0–60.0)
Patient temperature: 37

## 2016-06-08 LAB — TRIGLYCERIDES: Triglycerides: 144 mg/dL (ref <150)

## 2016-06-08 MED ORDER — SODIUM CHLORIDE 0.9 % IV SOLN
30.0000 meq | Freq: Once | INTRAVENOUS | Status: AC
  Administered 2016-06-08: 30 meq via INTRAVENOUS
  Filled 2016-06-08: qty 15

## 2016-06-08 MED ORDER — INSULIN ASPART 100 UNIT/ML ~~LOC~~ SOLN: 0.0000 [IU] | Freq: Every day | SUBCUTANEOUS | Status: DC

## 2016-06-08 MED ORDER — PNEUMOCOCCAL VAC POLYVALENT 25 MCG/0.5ML IJ INJ: 0.5000 mL | INJECTION | INTRAMUSCULAR | Status: DC

## 2016-06-08 MED ORDER — SODIUM CHLORIDE 0.9 % IV SOLN
1.0000 g | Freq: Once | INTRAVENOUS | Status: AC
  Administered 2016-06-08: 1 g via INTRAVENOUS
  Filled 2016-06-08: qty 10

## 2016-06-08 MED ORDER — KCL IN DEXTROSE-NACL 20-5-0.9 MEQ/L-%-% IV SOLN
INTRAVENOUS | Status: DC
  Administered 2016-06-08: 100 mL/h via INTRAVENOUS
  Filled 2016-06-08 (×2): qty 1000

## 2016-06-08 MED ORDER — INSULIN ASPART 100 UNIT/ML ~~LOC~~ SOLN
0.0000 [IU] | Freq: Three times a day (TID) | SUBCUTANEOUS | Status: DC
  Administered 2016-06-09: 5 [IU] via SUBCUTANEOUS
  Administered 2016-06-09: 9 [IU] via SUBCUTANEOUS
  Administered 2016-06-09: 2 [IU] via SUBCUTANEOUS
  Administered 2016-06-10: 5 [IU] via SUBCUTANEOUS
  Filled 2016-06-08: qty 2
  Filled 2016-06-08: qty 5
  Filled 2016-06-08: qty 9
  Filled 2016-06-08: qty 5

## 2016-06-08 MED ORDER — INSULIN DETEMIR 100 UNIT/ML ~~LOC~~ SOLN
10.0000 [IU] | Freq: Every day | SUBCUTANEOUS | Status: DC
  Administered 2016-06-08: 10 [IU] via SUBCUTANEOUS
  Filled 2016-06-08 (×2): qty 0.1

## 2016-06-08 MED ORDER — INFLUENZA VAC SPLIT QUAD 0.5 ML IM SUSY: 0.5000 mL | PREFILLED_SYRINGE | INTRAMUSCULAR | Status: DC

## 2016-06-08 MED ORDER — POTASSIUM CHLORIDE IN NACL 20-0.9 MEQ/L-% IV SOLN
INTRAVENOUS | Status: DC
  Administered 2016-06-08: 75 mL/h via INTRAVENOUS
  Administered 2016-06-08 – 2016-06-10 (×3): via INTRAVENOUS
  Filled 2016-06-08 (×6): qty 1000

## 2016-06-08 MED ORDER — ORAL CARE MOUTH RINSE
15.0000 mL | Freq: Two times a day (BID) | OROMUCOSAL | Status: DC
  Administered 2016-06-08 – 2016-06-09 (×3): 15 mL via OROMUCOSAL

## 2016-06-08 NOTE — Progress Notes (Signed)
Pharmacy Antibiotic Note  Michaela Johnson is a 55 y.o. female admitted on 06/07/2016 with DKA and intra-abdominal infection.  Pharmacy has been consulted for piperacillin/tazobactam dosing. Per MD note, CT shows possible pancreatitis ?acute cholecystitis   Plan: Piperacillin/tazobactam 3.375 IV q 8 hours  Weight: 128 lb (58.1 kg)  Temp (24hrs), Avg:97.9 F (36.6 C), Min:93.6 F (34.2 C), Max:99.3 F (37.4 C)   Recent Labs Lab 06/07/16 1139 06/07/16 1436 06/07/16 2130 06/08/16 0213 06/08/16 0557 06/08/16 1020  WBC 14.8* 14.3*  --   --  15.9*  --   CREATININE 1.50* 1.41* 1.57* 1.26* 1.08* 0.93    Estimated Creatinine Clearance: 54.3 mL/min (by C-G formula based on SCr of 0.93 mg/dL).    No Known Allergies  Antimicrobials this admission: Piperacillin/tazobactam 11/18 >>   Dose adjustments this admission:   Microbiology results:  BCx:   UCx:    Sputum:    MRSA PCR:   Thank you for allowing pharmacy to be a part of this patient's care.  Larene Beach, PharmD  06/08/2016 11:17 AM

## 2016-06-08 NOTE — Progress Notes (Signed)
Patient ID: Michaela Johnson, female   DOB: [birth year], 55 y.o.   MRN: [id number]  [facility] PROGRESS NOTE  Michaela Johnson [id number] DOB: [birth year] DOA: 06/07/2016 PCP: [name], NP  HPI/Subjective: Patient asking for something to drink. She states she drinks a sixpack a day. Some abdominal soreness. Nursing staff states that she is having trouble urinating. Placed on Cardizem drip last night for tachycardia.  Objective: Vitals:   06/08/16 1200 06/08/16 1300  BP: 130/69 123/64  Pulse: 95 89  Resp:  19  Temp:      Filed Weights   06/07/16 1141  Weight: 58.1 kg (128 lb)    ROS: Review of Systems  Constitutional: Negative for chills and fever.  Eyes: Negative for blurred vision.  Respiratory: Negative for cough and shortness of breath.   Cardiovascular: Negative for chest pain.  Gastrointestinal: Positive for abdominal pain, nausea and vomiting. Negative for constipation and diarrhea.  Genitourinary: Negative for dysuria.  Musculoskeletal: Negative for joint pain.  Neurological: Negative for dizziness and headaches.   Exam: Physical Exam  HENT:  Nose: No mucosal edema.  Mouth/Throat: No oropharyngeal exudate or posterior oropharyngeal edema.  Brownish residue around the lips.  Eyes: Conjunctivae, EOM and lids are normal. Pupils are equal, round, and reactive to light.  Neck: No JVD present. Carotid bruit is not present. No edema present. No thyroid mass and no thyromegaly present.  Cardiovascular: S1 normal and S2 normal.  Exam reveals no gallop.   Murmur heard.  Systolic murmur is present with a grade of 2/6  Pulses:      Dorsalis pedis pulses are 2+ on the right side, and 2+ on the left side.  Respiratory: No respiratory distress. She has decreased breath sounds in the right lower field and the left lower field. She has no wheezes. She has no rhonchi. She has no rales.  GI: Soft. Bowel sounds are normal. There is tenderness in the right upper quadrant,  epigastric area and left upper quadrant.  Musculoskeletal:       Right ankle: She exhibits no swelling.       Left ankle: She exhibits no swelling.  Lymphadenopathy:    She has no cervical adenopathy.  Neurological: She is alert. No cranial nerve deficit.  Skin: Skin is warm. No rash noted. Nails show no clubbing.  Psychiatric: She has a normal mood and affect.      Data Reviewed: Basic Metabolic Panel:  Recent Labs Lab 06/07/16 1436 06/07/16 2130 06/08/16 0213 06/08/16 0557 06/08/16 1020  NA 122* 127* 131* 132* 133*  K 5.9* 3.3* 2.9* 3.4* 3.4*  CL 89* 96* 96* 97* 97*  CO2 <7* 8* 22 24 27   GLUCOSE 882* 351* 127* 135* 128*  BUN 28* 29* 27* 24* 22*  CREATININE 1.41* 1.57* 1.26* 1.08* 0.93  CALCIUM 7.7* 7.3* 7.4* 7.6* 7.6*   Liver Function Tests:  Recent Labs Lab 06/07/16 1139  AST 48*  ALT 34  ALKPHOS 122  BILITOT 2.1*  PROT 7.9  ALBUMIN 3.9    Recent Labs Lab 06/07/16 2130  LIPASE 639*   CBC:  Recent Labs Lab 06/07/16 1139 06/07/16 1436 06/08/16 0557  WBC 14.8* 14.3* 15.9*  HGB 11.2* 10.8* 10.8*  HCT 37.7 36.6 29.1*  MCV 119.7* 119.9* 99.9  PLT 247 233 187   Cardiac Enzymes:  Recent Labs Lab 06/07/16 1139  TROPONINI <0.03    CBG:  Recent Labs Lab 06/08/16 0822 06/08/16 0845 06/08/16 0932 06/08/16 1034 06/08/16 1155  GLUCAP 136* 125*  117* 133* 110*    Recent Results (from the past 240 hour(s))  MRSA PCR Screening     Status: None   Collection Time: 06/07/16  6:45 PM  Result Value Ref Range Status   MRSA by PCR NEGATIVE NEGATIVE Final    Comment:        The GeneXpert MRSA Assay (FDA approved for NASAL specimens only), is one component of a comprehensive MRSA colonization surveillance program. It is not intended to diagnose MRSA infection nor to guide or monitor treatment for MRSA infections.      Studies: Dg Abd 1 View  Result Date: 06/07/2016 CLINICAL DATA:  Abdominal pain for 4 days EXAM: ABDOMEN - 1 VIEW  COMPARISON:  None. FINDINGS: There is no bowel dilatation or air-fluid level suggesting bowel obstruction. No free air. There is a sclerotic focus in the inferior right iliac bone which is stable and most likely represents a bone island. IMPRESSION: No bowel obstruction or free air evident. Electronically Signed   By: Lowella Grip III M.D.   On: 06/07/2016 14:21   Ct Abdomen Pelvis W Contrast  Result Date: 06/07/2016 CLINICAL DATA:  55 year old female with abdominal pain, nausea, vomiting and diarrhea for 3 days. EXAM: CT ABDOMEN AND PELVIS WITH CONTRAST TECHNIQUE: Multidetector CT imaging of the abdomen and pelvis was performed using the standard protocol following bolus administration of intravenous contrast. CONTRAST:  22mL ISOVUE-300 IOPAMIDOL (ISOVUE-300) INJECTION 61% COMPARISON:  03/17/2010 FINDINGS: Lower chest: No acute abnormality. Hepatobiliary: The liver is unremarkable. Mild enhancement and possible thickening of the gallbladder wall noted. There is no evidence of pericholecystic inflammation or biliary dilatation. Pancreas: There is equivocal stranding along pancreatic head. Correlate with labs as mild pancreatitis is not excluded. No other pancreatic abnormalities are identified. Spleen: Unremarkable Adrenals/Urinary Tract: The kidneys, adrenal glands are unremarkable. Bladder distention noted. Stomach/Bowel: There is no evidence of bowel obstruction or definite bowel wall thickening. Vascular/Lymphatic: Abdominal aortic atherosclerotic calcifications noted without aneurysm. Reproductive: Uterus and bilateral adnexa are unremarkable. Other: No free fluid or abscess. Musculoskeletal: Mild compression of the L1 superior endplate is new since 624THL but age indeterminate. IMPRESSION: Equivocal peripancreatic inflammation. Pancreatitis not excluded and correlate with labs. Mild enhancement and possible gallbladder wall thickening. Consider ultrasound evaluation if there is clinical suspicion for  acute cholecystitis. Mild L1 superior endplate compression fracture -age indeterminate. Aortic atherosclerotic calcifications. Electronically Signed   By: Margarette Canada M.D.   On: 06/07/2016 15:24   US Abdomen Limited Ruq  Result Date: 06/08/2016 CLINICAL DATA:  Right upper quadrant abdominal pain. EXAM: US ABDOMEN LIMITED - RIGHT UPPER QUADRANT COMPARISON:  06/07/2016 CT FINDINGS: Gallbladder: Gallbladder wall thickening is is identified measuring up to 4 mm. There is no evidence of cholelithiasis, pericholecystic fluid or sonographic Murphy sign. Common bile duct: Diameter: 5.8 mm. There is no evidence of intrahepatic or extrahepatic biliary dilatation. Liver: No focal lesion identified. Within normal limits in parenchymal echogenicity. IMPRESSION: Gallbladder wall thickening without cholelithiasis or other evidence of acute cholecystitis. Although acute cholecystitis is felt to be much less likely, consider nuclear medicine study if there is strong clinical suspicion for acute cholecystitis. Electronically Signed   By: Margarette Canada M.D.   On: 06/08/2016 11:25    Scheduled Meds: . citalopram  20 mg Oral Daily  . enoxaparin (LOVENOX) injection  40 mg Subcutaneous Q24H  . folic acid  1 mg Oral Daily  . insulin aspart  0-5 Units Subcutaneous QHS  . insulin aspart  0-9 Units Subcutaneous TID WC  .  insulin detemir  10 Units Subcutaneous Daily  . LORazepam  0-4 mg Oral Q6H   Followed by  . [START ON 06/09/2016] LORazepam  0-4 mg Oral Q12H  . mouth rinse  15 mL Mouth Rinse BID  . multivitamin with minerals  1 tablet Oral Daily  . pantoprazole  40 mg Oral Daily  . piperacillin-tazobactam (ZOSYN)  IV  3.375 g Intravenous Q8H  . sodium chloride flush  3 mL Intravenous Q12H  . thiamine  100 mg Oral Daily   Or  . thiamine  100 mg Intravenous Daily   Continuous Infusions: . 0.9 % NaCl with KCl 20 mEq / L 75 mL/hr at 06/08/16 1200  . diltiazem (CARDIZEM) infusion 5 mg/hr (06/08/16 1200)     Assessment/Plan:  1. Acute severe pancreatitis. Empiric antibiotic. IV fluids. Nothing by mouth except for ice chips. Recheck a lipase tomorrow morning. Likely alcohol as the cause. When necessary pain medications. Triglycerides normal range. 2. Diabetic ketoacidosis. This has resolved. Half dose Levemir and sliding scale while nothing by mouth. 3. Alcohol abuse patient placed on Ciwa protocol. Patient must stop drinking alcohol. 4. Tachycardia. Patient placed on Cardizem drip which has controlled the heart rate. 5. Send urine analysis. Urine analysis was not sent on admission 6. Depression on Celexa 7. GERD on Protonix  Code Status:     Code Status Orders        Start     Ordered   06/07/16 1347  Full code  Continuous     06/07/16 1349    Code Status History    Date Active Date Inactive Code Status Order ID Comments User Context   06/07/2016  1:49 PM  Full Code [id number]  Bettey Costa, MD ED      Disposition Plan: To be determined  Antibiotics:  Empiric Zosyn  Time spent: 25 minutes  Loletha Grayer  [employer]

## 2016-06-08 NOTE — Progress Notes (Signed)
2000~ Patient having sustained elevated heart rate into 140's-150's. Vital signs WNL, patient tolerating rate at neurologically and hemodynamically. Paged hospitalist and notified of the continued elevated heart rate. New order received. See MAR, followed through with new order.   2030~ After intervention, no improvement with heart rate, patient tolerating the rate still with vital signs WNL. Paged hospitalist 2nd time, notified of the elevated heart rate. Received new orders, followed through with orders received. Will continue to monitor and assess patient.

## 2016-06-09 LAB — GLUCOSE, CAPILLARY
GLUCOSE-CAPILLARY: 194 mg/dL — AB (ref 65–99)
Glucose-Capillary: >600 mg/dL (ref 65–99)
Glucose-Capillary: 354 mg/dL — ABNORMAL HIGH (ref 65–99)
Glucose-Capillary: 286 mg/dL — ABNORMAL HIGH (ref 65–99)
Glucose-Capillary: 131 mg/dL — ABNORMAL HIGH (ref 65–99)

## 2016-06-09 LAB — COMPREHENSIVE METABOLIC PANEL
ALBUMIN: 3 g/dL — AB (ref 3.5–5.0)
ALK PHOS: 95 U/L (ref 38–126)
ALT: 85 U/L — AB (ref 14–54)
AST: 205 U/L — ABNORMAL HIGH (ref 15–41)
Anion gap: 17 — ABNORMAL HIGH (ref 5–15)
BUN: 13 mg/dL (ref 6–20)
CALCIUM: 7.5 mg/dL — AB (ref 8.9–10.3)
CO2: 19 mmol/L — AB (ref 22–32)
CREATININE: 0.75 mg/dL (ref 0.44–1.00)
Chloride: 101 mmol/L (ref 101–111)
GFR calc Af Amer: >60 mL/min (ref >60)
GFR calc non Af Amer: >60 mL/min (ref >60)
GLUCOSE: 364 mg/dL — AB (ref 65–99)
Potassium: 3.5 mmol/L (ref 3.5–5.1)
SODIUM: 137 mmol/L (ref 135–145)
Total Bilirubin: 1.4 mg/dL — ABNORMAL HIGH (ref 0.3–1.2)
Total Protein: 6.5 g/dL (ref 6.5–8.1)

## 2016-06-09 LAB — CBC
HCT: 32.9 % — ABNORMAL LOW (ref 35.0–47.0)
Hemoglobin: 11.1 g/dL — ABNORMAL LOW (ref 12.0–16.0)
MCH: 34.7 pg — AB (ref 26.0–34.0)
MCHC: 33.7 g/dL (ref 32.0–36.0)
MCV: 102.9 fL — AB (ref 80.0–100.0)
PLATELETS: 151 10*3/uL (ref 150–440)
RBC: 3.2 MIL/uL — ABNORMAL LOW (ref 3.80–5.20)
RDW: 12.8 % (ref 11.5–14.5)
WBC: 10.9 10*3/uL (ref 3.6–11.0)

## 2016-06-09 LAB — LIPASE, BLOOD: Lipase: 116 U/L — ABNORMAL HIGH (ref 11–51)

## 2016-06-09 MED ORDER — GUAIFENESIN-DM 100-10 MG/5ML PO SYRP
5.0000 mL | ORAL_SOLUTION | ORAL | Status: DC | PRN
  Administered 2016-06-09: 5 mL via ORAL
  Filled 2016-06-09: qty 5

## 2016-06-09 MED ORDER — DILTIAZEM HCL ER COATED BEADS 120 MG PO CP24
120.0000 mg | ORAL_CAPSULE | Freq: Every day | ORAL | Status: DC
  Administered 2016-06-09 – 2016-06-10 (×2): 120 mg via ORAL
  Filled 2016-06-09 (×2): qty 1

## 2016-06-09 MED ORDER — INSULIN DETEMIR 100 UNIT/ML ~~LOC~~ SOLN
15.0000 [IU] | Freq: Every day | SUBCUTANEOUS | Status: DC
Start: 2016-06-09 — End: 2016-06-10
  Administered 2016-06-09 – 2016-06-10 (×2): 15 [IU] via SUBCUTANEOUS
  Filled 2016-06-09 (×2): qty 0.15

## 2016-06-09 MED ORDER — INFLUENZA VAC SPLIT QUAD 0.5 ML IM SUSY
0.5000 mL | PREFILLED_SYRINGE | INTRAMUSCULAR | Status: AC
  Administered 2016-06-10: 0.5 mL via INTRAMUSCULAR
  Filled 2016-06-09: qty 0.5

## 2016-06-09 MED ORDER — PNEUMOCOCCAL VAC POLYVALENT 25 MCG/0.5ML IJ INJ
0.5000 mL | INJECTION | INTRAMUSCULAR | Status: AC
  Administered 2016-06-10: 0.5 mL via INTRAMUSCULAR
  Filled 2016-06-09: qty 0.5

## 2016-06-09 NOTE — Progress Notes (Signed)
Patient ID: Michaela Johnson, female   DOB: [birth year], 55 y.o.   MRN: [id number]  [facility] PROGRESS NOTE  Michaela Johnson [id number] DOB: [birth year] DOA: 06/07/2016 PCP: [name], NP  HPI/Subjective: Patient feeling better denies abdominal pain. In normal sinus rhythm now denies any chest pains no nausea vomiting once to eat  Objective: Vitals:   06/09/16 1200 06/09/16 1300  BP: 134/75 (!) 153/77  Pulse: 89 85  Resp: 18 (!) 27  Temp:      Filed Weights   06/07/16 1141 06/08/16 1500  Weight: 128 lb (58.1 kg) 147 lb 0.8 oz (66.7 kg)    ROS: Review of Systems  Constitutional: Negative for chills and fever.  Eyes: Negative for blurred vision.  Respiratory: Negative for cough and shortness of breath.   Cardiovascular: Negative for chest pain.  Gastrointestinal: Negative for abdominal pain, constipation, diarrhea, nausea and vomiting.  Genitourinary: Negative for dysuria.  Musculoskeletal: Negative for joint pain.  Neurological: Negative for dizziness and headaches.   Exam: Physical Exam  HENT:  Nose: No mucosal edema.  Mouth/Throat: No oropharyngeal exudate or posterior oropharyngeal edema.  Brownish residue around the lips.  Eyes: Conjunctivae, EOM and lids are normal. Pupils are equal, round, and reactive to light.  Neck: No JVD present. Carotid bruit is not present. No edema present. No thyroid mass and no thyromegaly present.  Cardiovascular: S1 normal and S2 normal.  Exam reveals no gallop.   Murmur heard.  Systolic murmur is present with a grade of 2/6  Pulses:      Dorsalis pedis pulses are 2+ on the right side, and 2+ on the left side.  Respiratory: No respiratory distress. She has decreased breath sounds in the right lower field and the left lower field. She has no wheezes. She has no rhonchi. She has no rales.  GI: Soft. Bowel sounds are normal. There is no tenderness.  Musculoskeletal:       Right ankle: She exhibits no swelling.       Left ankle:  She exhibits no swelling.  Lymphadenopathy:    She has no cervical adenopathy.  Neurological: She is alert. No cranial nerve deficit.  Skin: Skin is warm. No rash noted. Nails show no clubbing.  Psychiatric: She has a normal mood and affect.      Data Reviewed: Basic Metabolic Panel:  Recent Labs Lab 06/08/16 0213 06/08/16 0557 06/08/16 1020 06/08/16 1405 06/09/16 0553  NA 131* 132* 133* 135 137  K 2.9* 3.4* 3.4* 3.1* 3.5  CL 96* 97* 97* 99* 101  CO2 22 24 27 29  19*  GLUCOSE 127* 135* 128* 94 364*  BUN 27* 24* 22* 18 13  CREATININE 1.26* 1.08* 0.93 0.83 0.75  CALCIUM 7.4* 7.6* 7.6* 7.4* 7.5*   Liver Function Tests:  Recent Labs Lab 06/07/16 1139 06/09/16 0553  AST 48* 205*  ALT 34 85*  ALKPHOS 122 95  BILITOT 2.1* 1.4*  PROT 7.9 6.5  ALBUMIN 3.9 3.0*    Recent Labs Lab 06/07/16 2130 06/09/16 0553  LIPASE 639* 116*   CBC:  Recent Labs Lab 06/07/16 1139 06/07/16 1436 06/08/16 0557 06/09/16 0553  WBC 14.8* 14.3* 15.9* 10.9  HGB 11.2* 10.8* 10.8* 11.1*  HCT 37.7 36.6 29.1* 32.9*  MCV 119.7* 119.9* 99.9 102.9*  PLT 247 233 187 151   Cardiac Enzymes:  Recent Labs Lab 06/07/16 1139  TROPONINI <0.03    CBG:  Recent Labs Lab 06/08/16 1546 06/08/16 1625 06/08/16 2125 06/09/16 0729 06/09/16 [place]  3 74 144* 354* 286*    Recent Results (from the past 240 hour(s))  MRSA PCR Screening     Status: None   Collection Time: 06/07/16  6:45 PM  Result Value Ref Range Status   MRSA by PCR NEGATIVE NEGATIVE Final    Comment:        The GeneXpert MRSA Assay (FDA approved for NASAL specimens only), is one component of a comprehensive MRSA colonization surveillance program. It is not intended to diagnose MRSA infection nor to guide or monitor treatment for MRSA infections.      Studies: Ct Abdomen Pelvis W Contrast  Result Date: 06/07/2016 CLINICAL DATA:  55 year old female with abdominal pain, nausea, vomiting and diarrhea for 3  days. EXAM: CT ABDOMEN AND PELVIS WITH CONTRAST TECHNIQUE: Multidetector CT imaging of the abdomen and pelvis was performed using the standard protocol following bolus administration of intravenous contrast. CONTRAST:  36mL ISOVUE-300 IOPAMIDOL (ISOVUE-300) INJECTION 61% COMPARISON:  03/17/2010 FINDINGS: Lower chest: No acute abnormality. Hepatobiliary: The liver is unremarkable. Mild enhancement and possible thickening of the gallbladder wall noted. There is no evidence of pericholecystic inflammation or biliary dilatation. Pancreas: There is equivocal stranding along pancreatic head. Correlate with labs as mild pancreatitis is not excluded. No other pancreatic abnormalities are identified. Spleen: Unremarkable Adrenals/Urinary Tract: The kidneys, adrenal glands are unremarkable. Bladder distention noted. Stomach/Bowel: There is no evidence of bowel obstruction or definite bowel wall thickening. Vascular/Lymphatic: Abdominal aortic atherosclerotic calcifications noted without aneurysm. Reproductive: Uterus and bilateral adnexa are unremarkable. Other: No free fluid or abscess. Musculoskeletal: Mild compression of the L1 superior endplate is new since 624THL but age indeterminate. IMPRESSION: Equivocal peripancreatic inflammation. Pancreatitis not excluded and correlate with labs. Mild enhancement and possible gallbladder wall thickening. Consider ultrasound evaluation if there is clinical suspicion for acute cholecystitis. Mild L1 superior endplate compression fracture -age indeterminate. Aortic atherosclerotic calcifications. Electronically Signed   By: Margarette Canada M.D.   On: 06/07/2016 15:24   US Abdomen Limited Ruq  Result Date: 06/08/2016 CLINICAL DATA:  Right upper quadrant abdominal pain. EXAM: US ABDOMEN LIMITED - RIGHT UPPER QUADRANT COMPARISON:  06/07/2016 CT FINDINGS: Gallbladder: Gallbladder wall thickening is is identified measuring up to 4 mm. There is no evidence of cholelithiasis, pericholecystic  fluid or sonographic Murphy sign. Common bile duct: Diameter: 5.8 mm. There is no evidence of intrahepatic or extrahepatic biliary dilatation. Liver: No focal lesion identified. Within normal limits in parenchymal echogenicity. IMPRESSION: Gallbladder wall thickening without cholelithiasis or other evidence of acute cholecystitis. Although acute cholecystitis is felt to be much less likely, consider nuclear medicine study if there is strong clinical suspicion for acute cholecystitis. Electronically Signed   By: Margarette Canada M.D.   On: 06/08/2016 11:25    Scheduled Meds: . citalopram  20 mg Oral Daily  . diltiazem  120 mg Oral Daily  . enoxaparin (LOVENOX) injection  40 mg Subcutaneous Q24H  . folic acid  1 mg Oral Daily  . [START ON 06/10/2016] Influenza vac split quadrivalent PF  0.5 mL Intramuscular Tomorrow-1000  . insulin aspart  0-5 Units Subcutaneous QHS  . insulin aspart  0-9 Units Subcutaneous TID WC  . insulin detemir  15 Units Subcutaneous Daily  . LORazepam  0-4 mg Oral Q12H  . mouth rinse  15 mL Mouth Rinse BID  . multivitamin with minerals  1 tablet Oral Daily  . pantoprazole  40 mg Oral Daily  . [START ON 06/10/2016] pneumococcal 23 valent vaccine  0.5 mL Intramuscular Tomorrow-1000  .  sodium chloride flush  3 mL Intravenous Q12H  . thiamine  100 mg Oral Daily   Or  . thiamine  100 mg Intravenous Daily   Continuous Infusions: . 0.9 % NaCl with KCl 20 mEq / L 75 mL/hr at 06/09/16 1340    Assessment/Plan:  1. Acute pancreatitis.Discontinue antibiotic due to alcohol , no evidence of gallstone pancreatitis I will start her on clear liquid diet advance as tolerated 2. Diabetic ketoacidosis. This has resolved. Increase her Levemir  3. Alcohol abuse continue on Ciwa protocol. Patient must stop drinking alcohol. 4. Tachycardia. Stop IV Cardizem drip started on oral Cardizem 5. Depression on Celexa 6. GERD on Protonix  Code Status:     Code Status Orders        Start      Ordered   06/07/16 1347  Full code  Continuous     06/07/16 1349    Code Status History    Date Active Date Inactive Code Status Order ID Comments User Context   06/07/2016  1:49 PM  Full Code [id number]  Bettey Costa, MD ED      Disposition Plan: To be determined  Antibiotics:  Empiric Zosyn  Time spent35 minutes  Jaiquan Temme, [place]

## 2016-06-09 NOTE — Progress Notes (Signed)
Inpatient Diabetes Program Recommendations  AACE/ADA: New Consensus Statement on Inpatient Glycemic Control (2015)  Target Ranges:  Prepandial:   less than 140 mg/dL      Peak postprandial:   less than 180 mg/dL (1-2 hours)      Critically ill patients:  140 - 180 mg/dL   Lab Results  Component Value Date   GLUCAP 354 (H) 06/09/2016   HGBA1C 9.4 (H) 06/07/2016    Review of Glycemic ControlResults for ZAIDYN, BARTUNEK (MRN [id number]) as of 06/09/2016 08:33  Ref. Range 06/08/2016 08:45 06/08/2016 09:32 06/08/2016 10:34 06/08/2016 11:55 06/08/2016 15:46 06/08/2016 16:25 06/08/2016 21:25 06/09/2016 07:29  Glucose-Capillary Latest Ref Range: 65 - 99 mg/dL 125 (H) 117 (H) 133 (H) 110 (H) 82 74 144 (H) 354 (H)   Inpatient Diabetes Program Recommendations:    Please increase Levemir to 20 units daily.  Also consider increasing Novolog to q4 hours while patient is NPO.    Thanks, Adah Perl, RN, BC-ADM Inpatient Diabetes Coordinator Pager [phone] (8a-5p)

## 2016-06-09 NOTE — Progress Notes (Signed)
Chaplain rounded the unit to provide a compassionate presence and support to the patient. Patient appeared to be sleeping.  No visitors were at the bedside. Chaplain provided silent prayer. Minerva Fester [phone]

## 2016-06-10 LAB — GLUCOSE, CAPILLARY
Glucose-Capillary: 251 mg/dL — ABNORMAL HIGH (ref 65–99)
Glucose-Capillary: 142 mg/dL — ABNORMAL HIGH (ref 65–99)

## 2016-06-10 LAB — COMPREHENSIVE METABOLIC PANEL
ALBUMIN: 2.8 g/dL — AB (ref 3.5–5.0)
ALK PHOS: 127 U/L — AB (ref 38–126)
ALT: 108 U/L — AB (ref 14–54)
AST: 257 U/L — AB (ref 15–41)
Anion gap: 10 (ref 5–15)
BILIRUBIN TOTAL: 1 mg/dL (ref 0.3–1.2)
BUN: 6 mg/dL (ref 6–20)
CO2: 22 mmol/L (ref 22–32)
Calcium: 7.5 mg/dL — ABNORMAL LOW (ref 8.9–10.3)
Chloride: 102 mmol/L (ref 101–111)
Creatinine, Ser: 0.51 mg/dL (ref 0.44–1.00)
GFR calc Af Amer: >60 mL/min (ref >60)
GFR calc non Af Amer: >60 mL/min (ref >60)
GLUCOSE: 243 mg/dL — AB (ref 65–99)
POTASSIUM: 3.7 mmol/L (ref 3.5–5.1)
Sodium: 134 mmol/L — ABNORMAL LOW (ref 135–145)
TOTAL PROTEIN: 6 g/dL — AB (ref 6.5–8.1)

## 2016-06-10 LAB — LIPASE, BLOOD: Lipase: 54 U/L — ABNORMAL HIGH (ref 11–51)

## 2016-06-10 MED ORDER — ENALAPRIL MALEATE 20 MG PO TABS: 10.0000 mg | ORAL_TABLET | Freq: Every day | ORAL | Status: DC

## 2016-06-10 MED ORDER — DILTIAZEM HCL ER COATED BEADS 120 MG PO CP24: 120.0000 mg | ORAL_CAPSULE | Freq: Every day | ORAL | 0 refills | Status: DC

## 2016-06-10 NOTE — Progress Notes (Signed)
Inpatient Diabetes Program Recommendations  AACE/ADA: New Consensus Statement on Inpatient Glycemic Control (2015)  Target Ranges:  Prepandial:   less than 140 mg/dL      Peak postprandial:   less than 180 mg/dL (1-2 hours)      Critically ill patients:  140 - 180 mg/dL   Lab Results  Component Value Date   GLUCAP 251 (H) 06/10/2016   HGBA1C 9.4 (H) 06/07/2016    Review of Glycemic ControlResults for [name], [name] (MRN [id number]) as of 06/10/2016 10:49  Ref. Range 06/09/2016 12:08 06/09/2016 16:56 06/09/2016 21:33 06/10/2016 05:50 06/10/2016 07:24  Glucose-Capillary Latest Ref Range: 65 - 99 mg/dL 286 (H) 194 (H) 131 (H)  251 (H)    Diabetes history: Diabetes mellitus- insulin requiring Outpatient Diabetes medications: Levemir 40 units daily and Novolog 5 units tid with meals  Current orders for Inpatient glycemic control:  Novolog sensitve tid with meals and HS, Levemir 15 units daily  Inpatient Diabetes Program Recommendations:    Consider increasing Levemir to 25 units daily.  Spoke with patient regarding A1C and diabetes management.  She states that A1C of 9.4% is actually an improvement from last visit.  She see's Dr. Elsworth Soho at [facility].  Currently she states she has medication and no needs regarding her diabetes.  Gave her handout on normal blood sugar levels and goal A1C.  Thanks, Michaela Perl, RN, BC-ADM Inpatient Diabetes Coordinator Pager [phone] (8a-5p)

## 2016-06-10 NOTE — Discharge Summary (Signed)
[facility] [place], 55 y.o., DOB 1961/03/20, MRN [id number]. Admission date: 06/07/2016 Discharge Date 06/10/2016 Primary MD [name], NP Admitting Physician Bettey Costa, MD  Admission Diagnosis  Nausea [R11.0] Abdominal pain [R10.9] Diabetic ketoacidosis without coma associated with type 1 diabetes mellitus ([place]) [E10.10]  Discharge Diagnosis   Active Problems:   DKA (diabetic ketoacidoses) ([place])    Acute pancreatitis   Diabetes type 1  Alcohol abuse Tachycardia Depression GERD        Hospital CourseSusan Johnson  is a 55 y.o. female with a known history of Insulin requiring diabetes and hypertension who presents with above complaint. Over the past several days patient has had diarrhea, nausea and vomiting. She is unable to take her insulin. Patient was seen in the ER and was noted to be in diabetic ketoacidosis. She was started on therapy with IV insulin and IV fluids. Her anion gap closed she was restarted back on her Levemir. Patient also had significant abdominal pain therefore had a CT of abdomen which showed acute pancreatitis. She also had ultrasound of the abdomen which failed to show gallbladder as a cause of her acute pancreatitis. She does drink heavily and alcohol was thought to be the cause for acute pancreatitis. She was kept nothing by mouth and pain control. Now her symptoms have resolved she strongly recommended not to drink. Her diet is advanced. She is doing much better and stable for discharge.             Consults  GI  Significant Tests:  See full reports for all details     Dg Abd 1 View  Result Date: 06/07/2016 CLINICAL DATA:  Abdominal pain for 4 days EXAM: ABDOMEN - 1 VIEW COMPARISON:  None. FINDINGS: There is no bowel dilatation or air-fluid level suggesting bowel obstruction. No free air. There is a sclerotic focus in the inferior right iliac bone which is stable and most likely represents a  bone island. IMPRESSION: No bowel obstruction or free air evident. Electronically Signed   By: Lowella Grip III M.D.   On: 06/07/2016 14:21   Ct Abdomen Pelvis W Contrast  Result Date: 06/07/2016 CLINICAL DATA:  55 year old female with abdominal pain, nausea, vomiting and diarrhea for 3 days. EXAM: CT ABDOMEN AND PELVIS WITH CONTRAST TECHNIQUE: Multidetector CT imaging of the abdomen and pelvis was performed using the standard protocol following bolus administration of intravenous contrast. CONTRAST:  57mL ISOVUE-300 IOPAMIDOL (ISOVUE-300) INJECTION 61% COMPARISON:  03/17/2010 FINDINGS: Lower chest: No acute abnormality. Hepatobiliary: The liver is unremarkable. Mild enhancement and possible thickening of the gallbladder wall noted. There is no evidence of pericholecystic inflammation or biliary dilatation. Pancreas: There is equivocal stranding along pancreatic head. Correlate with labs as mild pancreatitis is not excluded. No other pancreatic abnormalities are identified. Spleen: Unremarkable Adrenals/Urinary Tract: The kidneys, adrenal glands are unremarkable. Bladder distention noted. Stomach/Bowel: There is no evidence of bowel obstruction or definite bowel wall thickening. Vascular/Lymphatic: Abdominal aortic atherosclerotic calcifications noted without aneurysm. Reproductive: Uterus and bilateral adnexa are unremarkable. Other: No free fluid or abscess. Musculoskeletal: Mild compression of the L1 superior endplate is new since 624THL but age indeterminate. IMPRESSION: Equivocal peripancreatic inflammation. Pancreatitis not excluded and correlate with labs. Mild enhancement and possible gallbladder wall thickening. Consider ultrasound evaluation if there is clinical suspicion for acute cholecystitis. Mild L1 superior endplate compression fracture -age indeterminate. Aortic atherosclerotic calcifications. Electronically Signed   By: Margarette Canada M.D.   On: 06/07/2016 15:24  US Abdomen Limited  Ruq  Result Date: 06/08/2016 CLINICAL DATA:  Right upper quadrant abdominal pain. EXAM: US ABDOMEN LIMITED - RIGHT UPPER QUADRANT COMPARISON:  06/07/2016 CT FINDINGS: Gallbladder: Gallbladder wall thickening is is identified measuring up to 4 mm. There is no evidence of cholelithiasis, pericholecystic fluid or sonographic Murphy sign. Common bile duct: Diameter: 5.8 mm. There is no evidence of intrahepatic or extrahepatic biliary dilatation. Liver: No focal lesion identified. Within normal limits in parenchymal echogenicity. IMPRESSION: Gallbladder wall thickening without cholelithiasis or other evidence of acute cholecystitis. Although acute cholecystitis is felt to be much less likely, consider nuclear medicine study if there is strong clinical suspicion for acute cholecystitis. Electronically Signed   By: Margarette Canada M.D.   On: 06/08/2016 11:25       Today   Subjective:   Michaela Johnson well denies any chest pain or shortness of breath  Objective:   Blood pressure (!) 166/85, pulse 79, temperature 98.7 F (37.1 C), temperature source Oral, resp. rate 18, height 5\' 2"  (1.575 m), weight 147 lb 0.8 oz (66.7 kg), SpO2 91 %.  .  Intake/Output Summary (Last 24 hours) at 06/10/16 1531 Last data filed at 06/10/16 0926  Gross per 24 hour  Intake          1509.25 ml  Output                0 ml  Net          1509.25 ml    Exam VITAL SIGNS: Blood pressure (!) 166/85, pulse 79, temperature 98.7 F (37.1 C), temperature source Oral, resp. rate 18, height 5\' 2"  (1.575 m), weight 147 lb 0.8 oz (66.7 kg), SpO2 91 %.  GENERAL:  55 y.o.-year-old patient lying in the bed with no acute distress.  EYES: Pupils equal, round, reactive to light and accommodation. No scleral icterus. Extraocular muscles intact.  HEENT: Head atraumatic, normocephalic. Oropharynx and nasopharynx clear.  NECK:  Supple, no jugular venous distention. No thyroid enlargement, no tenderness.  LUNGS: Normal breath sounds  bilaterally, no wheezing, rales,rhonchi or crepitation. No use of accessory muscles of respiration.  CARDIOVASCULAR: S1, S2 normal. No murmurs, rubs, or gallops.  ABDOMEN: Soft, nontender, nondistended. Bowel sounds present. No organomegaly or mass.  EXTREMITIES: No pedal edema, cyanosis, or clubbing.  NEUROLOGIC: Cranial nerves II through XII are intact. Muscle strength 5/5 in all extremities. Sensation intact. Gait not checked.  PSYCHIATRIC: The patient is alert and oriented x 3.  SKIN: No obvious rash, lesion, or ulcer.   Data Review     CBC w Diff: Lab Results  Component Value Date   WBC 10.9 06/09/2016   HGB 11.1 (L) 06/09/2016   HCT 32.9 (L) 06/09/2016   PLT 151 06/09/2016   CMP: Lab Results  Component Value Date   NA 134 (L) 06/10/2016   K 3.7 06/10/2016   CL 102 06/10/2016   CO2 22 06/10/2016   BUN 6 06/10/2016   CREATININE 0.51 06/10/2016   PROT 6.0 (L) 06/10/2016   ALBUMIN 2.8 (L) 06/10/2016   BILITOT 1.0 06/10/2016   ALKPHOS 127 (H) 06/10/2016   AST 257 (H) 06/10/2016   ALT 108 (H) 06/10/2016  .  Micro Results Recent Results (from the past 240 hour(s))  MRSA PCR Screening     Status: None   Collection Time: 06/07/16  6:45 PM  Result Value Ref Range Status   MRSA by PCR NEGATIVE NEGATIVE Final    Comment:  The GeneXpert MRSA Assay (FDA approved for NASAL specimens only), is one component of a comprehensive MRSA colonization surveillance program. It is not intended to diagnose MRSA infection nor to guide or monitor treatment for MRSA infections.      Code Status History    Date Active Date Inactive Code Status Order ID Comments User Context   06/07/2016  1:49 PM 06/07/2016  1:49 PM Full Code [id number]  Bettey Costa, MD ED   06/07/2016  1:49 PM 06/10/2016  3:16 PM Full Code [id number]  Bettey Costa, MD ED          Follow-up Information    [name], NP Follow up in 7 day(s).   Specialty:  Nurse Practitioner Contact information: [address]  [id number] [phone]           Discharge Medications     Medication List    STOP taking these medications   Vitamin D (Ergocalciferol) 50000 units Caps capsule Commonly known as:  DRISDOL     TAKE these medications   citalopram 20 MG tablet Commonly known as:  CELEXA Take 1 tablet by mouth daily.   diltiazem 120 MG 24 hr capsule Commonly known as:  CARDIZEM CD Take 1 capsule (120 mg total) by mouth daily. Start taking on:  06/11/2016   enalapril 20 MG tablet Commonly known as:  VASOTEC Take 0.5 tablets (10 mg total) by mouth daily. What changed:  how much to take   FISH OIL PO Take 1 capsule by mouth daily.   insulin aspart 100 UNIT/ML FlexPen Commonly known as:  NOVOLOG Inject 5 Units into the skin 3 (three) times daily with meals.   LEVEMIR FLEXPEN [place] Inject into the skin.   MULTI-VITAMINS Tabs Take 1 tablet by mouth daily.   omeprazole 20 MG capsule Commonly known as:  PRILOSEC Take 1 capsule by mouth daily.          Total Time in preparing paper work, data evaluation and todays exam - 35 minutes  Dustin Flock M.D on 06/10/2016 at 3:31 PM  [facility]   Office  [phone]

## 2016-06-10 NOTE — Care Management (Signed)
Patient is for discharge home today.  She has no insurance.  Says she is followed by Dr Elsworth Soho at [facility].  She denies having any problems paying for her medications- especially her insulin.  She lives at home with her husband who receives disability.  Her cell phone is at home so she says she does not have her husband's phone number.  There is only one phone number contact in the chart- which appears to be the residence.  Left a VM message. Patient does not have her cell phone so she does not have any phone numbers to call for transportation home. Contacted [facility] for other contact numbers and only has the same one that is in [place].  Patient says that her husband has not called her since admitted.

## 2016-07-24 ENCOUNTER — Ambulatory Visit: Payer: Self-pay

## 2017-04-15 ENCOUNTER — Inpatient Hospital Stay: Payer: Self-pay

## 2017-04-15 ENCOUNTER — Emergency Department: Payer: Self-pay

## 2017-04-15 ENCOUNTER — Inpatient Hospital Stay
Admission: EM | Admit: 2017-04-15 | Discharge: 2017-04-17 | DRG: 637 | Disposition: A | Discharge status: Home / Self Care | Payer: Self-pay | Attending: Internal Medicine | Admitting: Internal Medicine

## 2017-04-15 DIAGNOSIS — R000 Tachycardia, unspecified: Secondary | ICD-10-CM | POA: Diagnosis present

## 2017-04-15 DIAGNOSIS — E1310 Other specified diabetes mellitus with ketoacidosis without coma: Secondary | ICD-10-CM

## 2017-04-15 DIAGNOSIS — I447 Left bundle-branch block, unspecified: Secondary | ICD-10-CM | POA: Diagnosis present

## 2017-04-15 DIAGNOSIS — I959 Hypotension, unspecified: Secondary | ICD-10-CM | POA: Diagnosis present

## 2017-04-15 DIAGNOSIS — Z79899 Other long term (current) drug therapy: Secondary | ICD-10-CM

## 2017-04-15 DIAGNOSIS — I10 Essential (primary) hypertension: Secondary | ICD-10-CM | POA: Diagnosis present

## 2017-04-15 DIAGNOSIS — R740 Nonspecific elevation of levels of transaminase and lactic acid dehydrogenase [LDH]: Secondary | ICD-10-CM | POA: Diagnosis present

## 2017-04-15 DIAGNOSIS — G8929 Other chronic pain: Secondary | ICD-10-CM | POA: Diagnosis present

## 2017-04-15 DIAGNOSIS — K219 Gastro-esophageal reflux disease without esophagitis: Secondary | ICD-10-CM | POA: Diagnosis present

## 2017-04-15 DIAGNOSIS — R748 Abnormal levels of other serum enzymes: Secondary | ICD-10-CM | POA: Diagnosis present

## 2017-04-15 DIAGNOSIS — E861 Hypovolemia: Secondary | ICD-10-CM | POA: Diagnosis present

## 2017-04-15 DIAGNOSIS — F17200 Nicotine dependence, unspecified, uncomplicated: Secondary | ICD-10-CM | POA: Diagnosis present

## 2017-04-15 DIAGNOSIS — E871 Hypo-osmolality and hyponatremia: Secondary | ICD-10-CM | POA: Diagnosis present

## 2017-04-15 DIAGNOSIS — E860 Dehydration: Secondary | ICD-10-CM | POA: Diagnosis present

## 2017-04-15 DIAGNOSIS — A419 Sepsis, unspecified organism: Secondary | ICD-10-CM | POA: Diagnosis present

## 2017-04-15 DIAGNOSIS — R945 Abnormal results of liver function studies: Secondary | ICD-10-CM | POA: Diagnosis present

## 2017-04-15 DIAGNOSIS — E1140 Type 2 diabetes mellitus with diabetic neuropathy, unspecified: Secondary | ICD-10-CM | POA: Diagnosis present

## 2017-04-15 DIAGNOSIS — R569 Unspecified convulsions: Secondary | ICD-10-CM

## 2017-04-15 DIAGNOSIS — F1010 Alcohol abuse, uncomplicated: Secondary | ICD-10-CM | POA: Diagnosis present

## 2017-04-15 DIAGNOSIS — F329 Major depressive disorder, single episode, unspecified: Secondary | ICD-10-CM | POA: Diagnosis present

## 2017-04-15 DIAGNOSIS — M549 Dorsalgia, unspecified: Secondary | ICD-10-CM | POA: Diagnosis present

## 2017-04-15 DIAGNOSIS — E876 Hypokalemia: Secondary | ICD-10-CM | POA: Diagnosis present

## 2017-04-15 DIAGNOSIS — F419 Anxiety disorder, unspecified: Secondary | ICD-10-CM | POA: Diagnosis present

## 2017-04-15 DIAGNOSIS — E875 Hyperkalemia: Secondary | ICD-10-CM | POA: Diagnosis present

## 2017-04-15 DIAGNOSIS — E1110 Type 2 diabetes mellitus with ketoacidosis without coma: Principal | ICD-10-CM | POA: Diagnosis present

## 2017-04-15 DIAGNOSIS — N179 Acute kidney failure, unspecified: Secondary | ICD-10-CM | POA: Diagnosis present

## 2017-04-15 DIAGNOSIS — Z794 Long term (current) use of insulin: Secondary | ICD-10-CM

## 2017-04-15 DIAGNOSIS — E1311 Other specified diabetes mellitus with ketoacidosis with coma: Secondary | ICD-10-CM

## 2017-04-15 DIAGNOSIS — G319 Degenerative disease of nervous system, unspecified: Secondary | ICD-10-CM | POA: Diagnosis present

## 2017-04-15 DIAGNOSIS — Z716 Tobacco abuse counseling: Secondary | ICD-10-CM

## 2017-04-15 DIAGNOSIS — G4089 Other seizures: Secondary | ICD-10-CM | POA: Diagnosis present

## 2017-04-15 DIAGNOSIS — G9349 Other encephalopathy: Secondary | ICD-10-CM | POA: Diagnosis present

## 2017-04-15 DIAGNOSIS — Z7141 Alcohol abuse counseling and surveillance of alcoholic: Secondary | ICD-10-CM

## 2017-04-15 HISTORY — DX: Other problems related to lifestyle: Z72.89

## 2017-04-15 HISTORY — DX: Polyneuropathy, unspecified: G62.9

## 2017-04-15 HISTORY — DX: Major depressive disorder, single episode, unspecified: F32.9

## 2017-04-15 HISTORY — DX: Other specified health status: Z78.9

## 2017-04-15 HISTORY — DX: Alcohol use, unspecified, uncomplicated: F10.90

## 2017-04-15 HISTORY — DX: Tobacco use: Z72.0

## 2017-04-15 HISTORY — DX: Depression, unspecified: F32.A

## 2017-04-15 HISTORY — DX: Gastro-esophageal reflux disease without esophagitis: K21.9

## 2017-04-15 LAB — CBC WITH DIFFERENTIAL/PLATELET
BASOS ABS: 0.1 10*3/uL (ref 0–0.1)
Basophils Relative: 0 %
EOS ABS: 0 10*3/uL (ref 0–0.7)
EOS PCT: 0 %
HCT: 39.2 % (ref 35.0–47.0)
Hemoglobin: 11.2 g/dL — ABNORMAL LOW (ref 12.0–16.0)
Lymphocytes Relative: 6 %
Lymphs Abs: 1 10*3/uL (ref 1.0–3.6)
MCH: 35 pg — ABNORMAL HIGH (ref 26.0–34.0)
MCHC: 28.5 g/dL — ABNORMAL LOW (ref 32.0–36.0)
MCV: 123 fL — AB (ref 80.0–100.0)
MONO ABS: 0.5 10*3/uL (ref 0.2–0.9)
Monocytes Relative: 3 %
Neutro Abs: 16.1 10*3/uL — ABNORMAL HIGH (ref 1.4–6.5)
Neutrophils Relative %: 91 %
PLATELETS: 312 10*3/uL (ref 150–440)
RBC: 3.19 MIL/uL — AB (ref 3.80–5.20)
RDW: 15.6 % — AB (ref 11.5–14.5)
WBC: 17.7 10*3/uL — AB (ref 3.6–11.0)

## 2017-04-15 LAB — GLUCOSE, CAPILLARY
GLUCOSE-CAPILLARY: 599 mg/dL — AB (ref 65–99)
GLUCOSE-CAPILLARY: 524 mg/dL — AB (ref 65–99)
GLUCOSE-CAPILLARY: 403 mg/dL — AB (ref 65–99)
GLUCOSE-CAPILLARY: 207 mg/dL — AB (ref 65–99)
GLUCOSE-CAPILLARY: 211 mg/dL — AB (ref 65–99)
Glucose-Capillary: >600 mg/dL (ref 65–99)
Glucose-Capillary: >600 mg/dL (ref 65–99)
Glucose-Capillary: 320 mg/dL — ABNORMAL HIGH (ref 65–99)
Glucose-Capillary: 272 mg/dL — ABNORMAL HIGH (ref 65–99)

## 2017-04-15 LAB — BLOOD GAS, VENOUS
Acid-Base Excess: UNDETERMINED mmol/L (ref 0.0–2.0)
Bicarbonate: UNDETERMINED mmol/L (ref 20.0–28.0)
O2 Saturation: 80 %
PCO2 VEN: 20 mmHg — AB (ref 44.0–60.0)
PO2 VEN: 54 mmHg — AB (ref 32.0–45.0)
Patient temperature: 37
pH, Ven: <6.9 — CL (ref 7.250–7.430)

## 2017-04-15 LAB — URINE DRUG SCREEN, QUALITATIVE (ARMC ONLY)
Amphetamines, Ur Screen: NOT DETECTED
BARBITURATES, UR SCREEN: NOT DETECTED
Benzodiazepine, Ur Scrn: NOT DETECTED
CANNABINOID 50 NG, UR ~~LOC~~: NOT DETECTED
COCAINE METABOLITE, UR ~~LOC~~: NOT DETECTED
MDMA (ECSTASY) UR SCREEN: NOT DETECTED
Methadone Scn, Ur: NOT DETECTED
OPIATE, UR SCREEN: NOT DETECTED
Phencyclidine (PCP) Ur S: NOT DETECTED
Tricyclic, Ur Screen: NOT DETECTED

## 2017-04-15 LAB — COMPREHENSIVE METABOLIC PANEL
ALBUMIN: 3.9 g/dL (ref 3.5–5.0)
ALK PHOS: 175 U/L — AB (ref 38–126)
ALT: 84 U/L — AB (ref 14–54)
AST: 204 U/L — AB (ref 15–41)
BILIRUBIN TOTAL: 1 mg/dL (ref 0.3–1.2)
BUN: 34 mg/dL — AB (ref 6–20)
CALCIUM: 9.2 mg/dL (ref 8.9–10.3)
CO2: <7 mmol/L — ABNORMAL LOW (ref 22–32)
CREATININE: 1.64 mg/dL — AB (ref 0.44–1.00)
Chloride: 84 mmol/L — ABNORMAL LOW (ref 101–111)
GFR calc Af Amer: 39 mL/min — ABNORMAL LOW (ref >60)
GFR calc non Af Amer: 34 mL/min — ABNORMAL LOW (ref >60)
GLUCOSE: 1085 mg/dL — AB (ref 65–99)
Potassium: 6.3 mmol/L (ref 3.5–5.1)
Sodium: 124 mmol/L — ABNORMAL LOW (ref 135–145)
TOTAL PROTEIN: 7.7 g/dL (ref 6.5–8.1)

## 2017-04-15 LAB — PHOSPHORUS: Phosphorus: 5.2 mg/dL — ABNORMAL HIGH (ref 2.5–4.6)

## 2017-04-15 LAB — URINALYSIS, COMPLETE (UACMP) WITH MICROSCOPIC
BILIRUBIN URINE: NEGATIVE
KETONES UR: 80 mg/dL — AB
LEUKOCYTES UA: NEGATIVE
NITRITE: NEGATIVE
PH: 5 (ref 5.0–8.0)
Protein, ur: 30 mg/dL — AB
RBC / HPF: NONE SEEN RBC/hpf (ref 0–5)
Specific Gravity, Urine: 1.018 (ref 1.005–1.030)
Squamous Epithelial / LPF: NONE SEEN
WBC, UA: NONE SEEN WBC/hpf (ref 0–5)

## 2017-04-15 LAB — LIPASE, BLOOD: Lipase: 18 U/L (ref 11–51)

## 2017-04-15 LAB — LACTIC ACID, PLASMA
LACTIC ACID, VENOUS: 3.7 mmol/L — AB (ref 0.5–1.9)
Lactic Acid, Venous: 4.2 mmol/L (ref 0.5–1.9)
Lactic Acid, Venous: 5 mmol/L (ref 0.5–1.9)
Lactic Acid, Venous: 4.4 mmol/L (ref 0.5–1.9)

## 2017-04-15 LAB — BETA-HYDROXYBUTYRIC ACID

## 2017-04-15 LAB — ETHANOL: Alcohol, Ethyl (B): <10 mg/dL (ref <10)

## 2017-04-15 LAB — TROPONIN I: Troponin I: <0.03 ng/mL (ref <0.03)

## 2017-04-15 LAB — PROCALCITONIN: Procalcitonin: 1.49 ng/mL

## 2017-04-15 LAB — MRSA PCR SCREENING: MRSA BY PCR: NEGATIVE

## 2017-04-15 MED ORDER — INSULIN ASPART 100 UNIT/ML ~~LOC~~ SOLN
10.0000 [IU] | Freq: Once | SUBCUTANEOUS | Status: AC
Start: 2017-04-15 — End: 2017-04-15
  Administered 2017-04-15: 10 [IU] via INTRAVENOUS

## 2017-04-15 MED ORDER — LORAZEPAM 2 MG/ML IJ SOLN
1.0000 mg | Freq: Once | INTRAMUSCULAR | Status: AC
  Administered 2017-04-15: 1 mg via INTRAVENOUS

## 2017-04-15 MED ORDER — CALCIUM GLUCONATE 10 % IV SOLN
INTRAVENOUS | Status: AC
  Administered 2017-04-15: 1000 mg
  Filled 2017-04-15: qty 10

## 2017-04-15 MED ORDER — SODIUM CHLORIDE 0.9 % IV BOLUS (SEPSIS)
1000.0000 mL | Freq: Once | INTRAVENOUS | Status: AC
  Administered 2017-04-15: 1000 mL via INTRAVENOUS

## 2017-04-15 MED ORDER — ONDANSETRON HCL 4 MG/2ML IJ SOLN: 4.0000 mg | Freq: Four times a day (QID) | INTRAMUSCULAR | Status: DC | PRN

## 2017-04-15 MED ORDER — SODIUM BICARBONATE 8.4 % IV SOLN
100.0000 meq | Freq: Once | INTRAVENOUS | Status: AC
  Administered 2017-04-15: 100 meq via INTRAVENOUS

## 2017-04-15 MED ORDER — INSULIN REGULAR BOLUS VIA INFUSION
0.0000 [IU] | Freq: Three times a day (TID) | INTRAVENOUS | Status: DC
  Filled 2017-04-15: qty 10

## 2017-04-15 MED ORDER — ORAL CARE MOUTH RINSE: 15.0000 mL | Freq: Two times a day (BID) | OROMUCOSAL | Status: DC

## 2017-04-15 MED ORDER — ACETAMINOPHEN 325 MG PO TABS
650.0000 mg | ORAL_TABLET | Freq: Four times a day (QID) | ORAL | Status: DC | PRN
Start: 2017-04-15 — End: 2017-04-17

## 2017-04-15 MED ORDER — DEXTROSE-NACL 5-0.45 % IV SOLN: INTRAVENOUS | Status: DC

## 2017-04-15 MED ORDER — CHLORHEXIDINE GLUCONATE 0.12 % MT SOLN: 15.0000 mL | Freq: Two times a day (BID) | OROMUCOSAL | Status: DC

## 2017-04-15 MED ORDER — PIPERACILLIN-TAZOBACTAM 3.375 G IVPB 30 MIN
3.3750 g | Freq: Once | INTRAVENOUS | Status: AC
  Administered 2017-04-15: 3.375 g via INTRAVENOUS
  Filled 2017-04-15: qty 50

## 2017-04-15 MED ORDER — PIPERACILLIN-TAZOBACTAM 3.375 G IVPB
3.3750 g | Freq: Three times a day (TID) | INTRAVENOUS | Status: DC
  Administered 2017-04-15 – 2017-04-17 (×5): 3.375 g via INTRAVENOUS
  Filled 2017-04-15 (×6): qty 50

## 2017-04-15 MED ORDER — SODIUM CHLORIDE 0.9 % IV SOLN
INTRAVENOUS | Status: DC
  Administered 2017-04-15: 22:00:00 via INTRAVENOUS

## 2017-04-15 MED ORDER — LORAZEPAM 2 MG/ML IJ SOLN: 1.0000 mg | INTRAMUSCULAR | Status: DC | PRN

## 2017-04-15 MED ORDER — VANCOMYCIN HCL IN DEXTROSE 1-5 GM/200ML-% IV SOLN
1000.0000 mg | Freq: Once | INTRAVENOUS | Status: DC
  Administered 2017-04-15: 1000 mg via INTRAVENOUS
  Filled 2017-04-15: qty 200

## 2017-04-15 MED ORDER — SODIUM CHLORIDE 0.9 % IV SOLN: INTRAVENOUS | Status: DC

## 2017-04-15 MED ORDER — INSULIN REGULAR HUMAN 100 UNIT/ML IJ SOLN
INTRAMUSCULAR | Status: DC
  Administered 2017-04-15: 10.8 [IU]/h via INTRAVENOUS
  Administered 2017-04-15: 16.2 [IU]/h via INTRAVENOUS
  Administered 2017-04-15: 5.4 [IU]/h via INTRAVENOUS
  Filled 2017-04-15: qty 1

## 2017-04-15 MED ORDER — SODIUM CHLORIDE 0.9 % IV BOLUS (SEPSIS): 500.0000 mL | Freq: Once | INTRAVENOUS | Status: DC

## 2017-04-15 MED ORDER — DEXTROSE 50 % IV SOLN: 25.0000 mL | INTRAVENOUS | Status: DC | PRN

## 2017-04-15 MED ORDER — SODIUM BICARBONATE 8.4 % IV SOLN
INTRAVENOUS | Status: DC
  Filled 2017-04-15 (×2): qty 150

## 2017-04-15 MED ORDER — DEXTROSE-NACL 5-0.45 % IV SOLN
INTRAVENOUS | Status: DC
  Administered 2017-04-15 – 2017-04-16 (×3): via INTRAVENOUS

## 2017-04-15 MED ORDER — ACETAMINOPHEN 650 MG RE SUPP: 650.0000 mg | Freq: Four times a day (QID) | RECTAL | Status: DC | PRN

## 2017-04-15 MED ORDER — SODIUM BICARBONATE 8.4 % IV SOLN
INTRAVENOUS | Status: AC
  Administered 2017-04-15: 100 meq via INTRAVENOUS
  Filled 2017-04-15: qty 100

## 2017-04-15 MED ORDER — ONDANSETRON HCL 4 MG PO TABS: 4.0000 mg | ORAL_TABLET | Freq: Four times a day (QID) | ORAL | Status: DC | PRN

## 2017-04-15 MED ORDER — INSULIN REGULAR HUMAN 100 UNIT/ML IJ SOLN
INTRAMUSCULAR | Status: DC
  Administered 2017-04-15: 5.4 [IU]/h via INTRAVENOUS
  Administered 2017-04-15: 6.9 [IU]/h via INTRAVENOUS
  Filled 2017-04-15: qty 1

## 2017-04-15 MED ORDER — SODIUM CHLORIDE 0.9 % IV SOLN: INTRAVENOUS | Status: AC

## 2017-04-15 MED ORDER — SODIUM CHLORIDE 0.9 % IV SOLN
1.0000 g | Freq: Once | INTRAVENOUS | Status: AC
  Administered 2017-04-15: 1 g via INTRAVENOUS
  Filled 2017-04-15: qty 10

## 2017-04-15 MED ORDER — LORAZEPAM 2 MG/ML IJ SOLN
INTRAMUSCULAR | Status: AC
  Filled 2017-04-15: qty 1

## 2017-04-15 MED ORDER — LORAZEPAM 2 MG/ML IJ SOLN: 1.0000 mg | Freq: Once | INTRAMUSCULAR | Status: DC

## 2017-04-15 MED ORDER — PROMETHAZINE HCL 25 MG/ML IJ SOLN
12.5000 mg | Freq: Once | INTRAMUSCULAR | Status: AC
  Administered 2017-04-15: 12.5 mg via INTRAVENOUS
  Filled 2017-04-15: qty 1

## 2017-04-15 MED ORDER — ENOXAPARIN SODIUM 40 MG/0.4ML ~~LOC~~ SOLN
40.0000 mg | SUBCUTANEOUS | Status: DC
  Administered 2017-04-15 – 2017-04-16 (×2): 40 mg via SUBCUTANEOUS
  Filled 2017-04-15 (×2): qty 0.4

## 2017-04-15 NOTE — ED Notes (Signed)
Pt returned from CT °

## 2017-04-15 NOTE — Progress Notes (Signed)
New ICU patient eval from [facility] by eMD    Active Problems:   DKA (diabetic ketoacidoses) ([place])    Exam Vitals:   04/15/17 1530 04/15/17 1552 04/15/17 1553 04/15/17 1600  BP: 92/61 104/73 (!) 94/56 94/60  Pulse: (!) 107  88 (!) 109  Resp: 20 17 (!) 24 (!) 27  Temp: (!) 94.1 F (34.5 C)  (!) 94.6 F (34.8 C) (!) 95 F (35 C)  TempSrc:   Other (Comment)   SpO2: 100% 100% 100% 100%  Weight:  137 lb 5.6 oz (62.3 kg)    Height:  5\' 2"  (1.575 m)      Camera Exam Resting quietly Tachy MAP 71 HR 111   Important labs  Recent Labs Lab 04/15/17 1211  HGB 11.2*  HCT 39.2  WBC 17.7*  PLT 312     Recent Labs Lab 04/15/17 1211  NA 124*  K 6.3*  CL 84*  CO2 <7*  GLUCOSE 1,085*  BUN 34*  CREATININE 1.64*  CALCIUM 9.2       Recent Labs Lab 04/15/17 1211  TROPONINI <0.03      Recent Labs Lab 04/15/17 1154  LATICACIDVEN 4.2*     Recent Labs Lab 04/15/17 1211  AST 204*  ALT 84*  ALKPHOS 175*  BILITOT 1.0  PROT 7.7  ALBUMIN 3.9    No results for input(s): INR in the last 168 hours.   Recent Labs Lab 04/15/17 1211  HCO3 UNABLE TO CALCULATE.  O2SAT 80.0     Ct Head Wo Contrast  Result Date: 04/15/2017 CLINICAL DATA:  Seizure. EXAM: CT HEAD WITHOUT CONTRAST TECHNIQUE: Contiguous axial images were obtained from the base of the skull through the vertex without intravenous contrast. COMPARISON:  None. FINDINGS: Brain: Mild diffuse cortical atrophy is noted. No mass effect or midline shift is noted. Ventricular size is within normal limits. There is no evidence of mass lesion, hemorrhage or acute infarction. Vascular: No hyperdense vessel or unexpected calcification. Skull: Normal. Negative for fracture or focal lesion. Sinuses/Orbits: No acute finding. Other: None. IMPRESSION: Mild diffuse cortical atrophy. No acute intracranial abnormality seen. Electronically Signed   By: Marijo Conception, M.D.   On: 04/15/2017 15:28   Dg Chest Port 1  View  Result Date: 04/15/2017 CLINICAL DATA:  Initial evaluation for acute CTA, abdominal pain. EXAM: PORTABLE CHEST 1 VIEW COMPARISON:  Prior radiograph from 03/09/2010. FINDINGS: Mild cardiomegaly, stable. Mediastinal silhouette within normal limits. Aortic atherosclerosis. Lungs mildly hypoinflated with elevation of the left hemidiaphragm. No focal infiltrates. No pulmonary edema or pleural effusion. No pneumothorax. No acute osseus abnormality.  Bones mildly osteopenic. IMPRESSION: 1. No active cardiopulmonary disease. 2. Aortic atherosclerosis. Electronically Signed   By: Jeannine Boga M.D.   On: 04/15/2017 13:06       ASSESSMENT dka Tachy Lactic acid   PLAN Fluid bolus and recheck lactate and check mag and phos as elink intervention   Dr. Brand Males, M.D., [facility].C.P Pulmonary and Critical Care Medicine Staff Physician [facility] Pager: [phone], If no answer or between  15:00h - 7:00h: call [phone]  04/15/2017 4:43 PM

## 2017-04-15 NOTE — ED Triage Notes (Signed)
Per EMS pt comes from home and called out with a c/o of abd pain.  When EMS took her blood sugar it showed >600.  Pt said she didn't feel well enough to take her insulin this morning.  Pt is tachycardic at 105 and b/p 91/51.

## 2017-04-15 NOTE — ED Notes (Signed)
Bear hugger applied to pt due to temp 91.8.. EDP aware.Michaela Johnson

## 2017-04-15 NOTE — ED Provider Notes (Addendum)
[name] Emergency Department Provider Note  ____________________________________________   I have reviewed the triage vital signs and the nursing notes.   HISTORY  Chief Complaint Hyperglycemia    HPI Michaela Johnson is a 56 y.o. female Presents today complaining of vomiting and feeling weak. Patient does have a history of EtOH abuse as well as diabetes mellitus and DKA, last visit was for DKA she was significantly acidotic at that time. She states she has not been taking her insulin. She has not had anything to eat in 2 days. She denies fever or chills. She has chronic back pain, she states is been no change in that. No focal numbness or weakness. Denies chest pain or shortness of breath. Denies cough.  Pt states she feels generally weak and dehydrated. Symptoms for 2-3 days, worse since yesterday, nothing makes it better and nothing makes it worse, no other complaints   Past Medical History:  Diagnosis Date  . Diabetes mellitus without complication ([place])   . Hypertension     Patient Active Problem List   Diagnosis Date Noted  . DKA (diabetic ketoacidoses) ([place]) 06/07/2016    History reviewed. No pertinent surgical history.  Prior to Admission medications   Medication Sig Start Date End Date Taking? Authorizing Provider  fluticasone (FLONASE) 50 MCG/ACT nasal spray Place 2 sprays into both nostrils daily.   Yes [provider]  Multiple Vitamin (MULTI-VITAMINS) TABS Take 1 tablet by mouth daily.   Yes [provider]  Omega-3 Fatty Acids (FISH OIL PO) Take 1 capsule by mouth daily.   Yes [provider]  citalopram (CELEXA) 20 MG tablet Take 1 tablet by mouth daily. 05/13/16 05/13/17  [provider]  diltiazem (CARDIZEM CD) 120 MG 24 hr capsule Take 1 capsule (120 mg total) by mouth daily. Patient not taking: Reported on 04/15/2017 06/11/16   Dustin Flock, MD  enalapril (VASOTEC) 20 MG tablet Take 0.5 tablets  (10 mg total) by mouth daily. Patient not taking: Reported on 04/15/2017 06/10/16   Dustin Flock, MD  insulin aspart (NOVOLOG) 100 UNIT/ML FlexPen Inject 5 Units into the skin 3 (three) times daily with meals.    [provider]  Insulin Detemir (LEVEMIR FLEXPEN [place]) Inject into the skin.    [provider]  omeprazole (PRILOSEC) 20 MG capsule Take 1 capsule by mouth daily.    [provider]    Allergies Patient has no known allergies.  History reviewed. No pertinent family history.  Social History Social History  Substance Use Topics  . Smoking status: Current Some Day Smoker  . Smokeless tobacco: Never Used  . Alcohol use Not on file    Review of Systems Constitutional: No fever/chills Eyes: No visual changes. ENT: No sore throat. No stiff neck no neck pain Cardiovascular: Denies chest pain. Respiratory: Denies shortness of breath. Gastrointestinal:   positivevomiting.  No diarrhea.  No constipation. Genitourinary: Negative for dysuria. Musculoskeletal: Negative lower extremity swelling Skin: Negative for rash. Neurological: Negative for severe headaches, focal weakness or numbness.   ____________________________________________   PHYSICAL EXAM:  VITAL SIGNS: ED Triage Vitals  Enc Vitals Group     BP 04/15/17 1155 (!) 91/51     Pulse Rate 04/15/17 1155 (!) 105     Resp 04/15/17 1155 (!) 24     Temp --      Temp src --      SpO2 04/15/17 1155 100 %     Weight 04/15/17 1200 125 lb (56.7 kg)  Height 04/15/17 1200 5\' 2"  (1.575 m)     Head Circumference --      Peak Flow --      Pain Score 04/15/17 1150 8     Pain Loc --      Pain Edu? --      Excl. in [place]? --     Constitutional: patient alert and oriented initially, with some confusion she is angry that I'm not immediately giving her food and drink upon arrival however she also complains of nausea. Eyes: Conjunctivae are normal Head: Atraumatic HEENT: No congestion/rhinnorhea.  Mucous membranes are dry.  Oropharynx non-erythematous Neck:   Nontender with no meningismus, no masses, no stridor Cardiovascular: tachycardia,, regular rhythm. Grossly normal heart sounds.  Good peripheral circulation. Respiratory: Normal respiratory effort.  No retractions. Lungs CTAB. Abdominal: Soft and nontender. No distention. No guarding no rebound Back:  There is no focal tenderness or step off.  there is no midline tenderness there are no lesions noted. there is no CVA tenderness Musculoskeletal: No lower extremity tenderness, no upper extremity tenderness. No joint effusions, no DVT signs strong distal pulses no edema Neurologic:  Normal speech and language. No gross focal neurologic deficits are appreciated.  Skin:  Skin is warm, dry and intact. No rash noted. Psychiatric: Mood and affect are irritated. Speech and behavior are normal.  ____________________________________________   LABS (all labs ordered are listed, but only abnormal results are displayed)  Labs Reviewed  BLOOD GAS, VENOUS - Abnormal; Notable for the following:       Result Value   pCO2, Ven 20 (*)    All other components within normal limits  GLUCOSE, CAPILLARY - Abnormal; Notable for the following:    Glucose-Capillary >600 (*)    All other components within normal limits  URINE CULTURE  ETHANOL  COMPREHENSIVE METABOLIC PANEL  CBC WITH DIFFERENTIAL/PLATELET  BETA-HYDROXYBUTYRIC ACID  URINALYSIS, COMPLETE (UACMP) WITH MICROSCOPIC  TROPONIN I  URINE DRUG SCREEN, QUALITATIVE ([facility] ONLY)  LIPASE, BLOOD  LACTIC ACID, PLASMA  LACTIC ACID, PLASMA    Pertinent labs  results that were available during my care of the patient were reviewed by me and considered in my medical decision making (see chart for details). ____________________________________________  EKG  I personally interpreted any EKGs ordered by me or triage patient is a known left bundle branch block, rate 57  bpm ____________________________________________  RADIOLOGY  Pertinent labs & imaging results that were available during my care of the patient were reviewed by me and considered in my medical decision making (see chart for details). If possible, patient and/or family made aware of any abnormal findings. ____________________________________________    PROCEDURES  Procedure(s) performed: None  Procedures  Critical Care performed: CRITICAL CARE Performed by: Schuyler Amor   Total critical care time: 90 minutes  Critical care time was exclusive of separately billable procedures and treating other patients.  Critical care was necessary to treat or prevent imminent or life-threatening deterioration.  Critical care was time spent personally by me on the following activities: development of treatment plan with patient and/or surrogate as well as nursing, discussions with consultants, evaluation of patient's response to treatment, examination of patient, obtaining history from patient or surrogate, ordering and performing treatments and interventions, ordering and review of laboratory studies, ordering and review of radiographic studies, pulse oximetry and re-evaluation of patient's condition.   ____________________________________________   INITIAL IMPRESSION / ASSESSMENT AND PLAN / ED COURSE  Pertinent labs & imaging results that were available during my  care of the patient were reviewed by me and considered in my medical decision making (see chart for details).  patient here with what looks to be likely DKA again, blood glucose is over 6 in for EMS, focal for last just potassium 6.3. Difficult to tell but having an impact on her EKG given baseline left bundle branch block. We are giving her calcium gluconate, bicarbonate insulin, and obviously will avoid Lasix given significant dehydration. We have ordered several liters of fluid, insulin stabilizer, patient will require admission to  the hospital. Marya Amsler also looking for possible signs of infection that could've caused this. She will require rectal temp  ----------------------------------------- 1:40 PM on 04/15/2017 -----------------------------------------  Called to bedside patient apparently had nurse-witnessed seizure, I  was concerned that this may happen but iI was reluctant to give Ativan because the patient questionable pressure in the low 90s. We have given her Ativan. She was not tachycardic prior to this. She is appropriately postictal and don't think he needs to be intubated at this moment. Clinical Course as of Apr 15 2054  Wed Apr 15, 2017  1520 patient waking up now without painful stimuli, wakes up to voice, blood pressure trending up. I do not think she requires intubation or pressors at this time we are continuing to give her copious amount of IV fluid she is admitted to the ICU.  [JM]    Clinical Course User Index [JM] Schuyler Amor, MD   ____________________________________________   FINAL CLINICAL IMPRESSION(S) / ED DIAGNOSES  Final diagnoses:  DKA (diabetic ketoacidoses) ([place])      This chart was dictated using voice recognition software.  Despite best efforts to proofread,  errors can occur which can change meaning.       Schuyler Amor, MD 04/15/17 1308    Schuyler Amor, MD 04/15/17 1341    Schuyler Amor, MD 04/15/17 [phone]

## 2017-04-15 NOTE — Progress Notes (Signed)
Sepsis Note:   Code sepsis called at 90. Page received by pharmacy at 1528. Vancomycin infusing. Pharmacist called nurse at 1529 to start Zosyn immediately. Nurse informed pharmacist that she did have Zosyn in [place]. Zosyn sent to ED at 1535. As of 1603 Zosyn currently not documented as infusing.  MLS  [id number].

## 2017-04-15 NOTE — ED Notes (Signed)
The pt has been more responsive in the past 39min, pt responds to verbal stimili. Is not oriented at this time but can follow simple commands.

## 2017-04-15 NOTE — ED Notes (Addendum)
Pt noted having a seizure, stiffened eye gazed to the right. EDP called to the bedside and ativan order initiated. Pt placed on a NRB.

## 2017-04-15 NOTE — H&P (Signed)
[facility] NAME: Michaela Johnson    MR#:  [id number]  DATE OF BIRTH:  24-Mar-1961  DATE OF ADMISSION:  04/15/2017  PRIMARY CARE PHYSICIAN: [name], NP   REQUESTING/REFERRING PHYSICIAN: Dr. Charlotte Crumb  CHIEF COMPLAINT:   Chief Complaint  Patient presents with  . Hyperglycemia    HISTORY OF PRESENT ILLNESS:  Michaela Johnson  is a 56 y.o. female with a known history of Hypertension, type 2 diabetes mellitus, alcohol abuse, smoking, depression and anxiety, neuropathy presents to the hospital secondary to altered mental status and abdominal pain. Patient is obtunded at this time, unable to provide any history. Most of the history is obtained from ER physician and also old records. No other family is available at this time. Patient is supposed to be on Levemir for her diabetes along with NovoLog insulin, not sure if she has been compliant. She called EMS for abdominal pain and was noted to have elevated sugars. Here she has undetectably low bicarbonate level, severely acidotic with elevated potassium, blood sugar of greater than thousand. She was started on fluids and insulin drip. Patient was alert enough to communicate when she initially presented. She just had a tonic-clonic seizure and is in postictal state. Blood pressure dropped and she is receiving IV fluids.   PAST MEDICAL HISTORY:   Past Medical History:  Diagnosis Date  . Alcohol use   . Depression   . Diabetes mellitus without complication ([place])   . GERD (gastroesophageal reflux disease)   . Hypertension   . Neuropathy   . Tobacco use     PAST SURGICAL HISTORY:  History reviewed. No pertinent surgical history.  SOCIAL HISTORY:   Social History  Substance Use Topics  . Smoking status: Current Some Day Smoker  . Smokeless tobacco: Never Used  . Alcohol use Yes    FAMILY HISTORY:   Family History  Problem Relation Age of Onset  . Family history unknown:  Yes  As patient is obtunded  DRUG ALLERGIES:  No Known Allergies  REVIEW OF SYSTEMS:   Review of Systems  Unable to perform ROS: Critical illness    MEDICATIONS AT HOME:   Prior to Admission medications   Medication Sig Start Date End Date Taking? Authorizing Provider  citalopram (CELEXA) 20 MG tablet Take 1 tablet by mouth daily. 05/13/16 05/13/17  [provider]  diltiazem (CARDIZEM CD) 120 MG 24 hr capsule Take 1 capsule (120 mg total) by mouth daily. Patient not taking: Reported on 04/15/2017 06/11/16   Dustin Flock, MD  enalapril (VASOTEC) 20 MG tablet Take 0.5 tablets (10 mg total) by mouth daily. 06/10/16   Dustin Flock, MD  fluticasone (FLONASE) 50 MCG/ACT nasal spray Place 2 sprays into both nostrils daily.    [provider]  insulin aspart (NOVOLOG) 100 UNIT/ML FlexPen Inject 5 Units into the skin 3 (three) times daily with meals.    [provider]  Insulin Detemir (LEVEMIR FLEXPEN ) Inject 40 Units into the skin at bedtime.     [provider]  Multiple Vitamin (MULTI-VITAMINS) TABS Take 1 tablet by mouth daily.    [provider]  Omega-3 Fatty Acids (FISH OIL PO) Take 1 capsule by mouth daily.    [provider]  omeprazole (PRILOSEC) 20 MG capsule Take 1 capsule by mouth daily.    [provider]      VITAL SIGNS:  Blood pressure (!) 81/50, pulse 96, temperature (!) 92  F (33.3 C), resp. rate 17, height 5\' 2"  (1.575 m), weight 56.7 kg (125 lb), SpO2 100 %.  PHYSICAL EXAMINATION:   Physical Exam  GENERAL:  56 y.o.-year-old patient lying in the bed, confused, critically ill appearing.  EYES: Pupils equal, round, reactive to light and accommodation. No scleral icterus. Extraocular muscles intact.  HEENT: Head atraumatic, normocephalic. Oropharynx and nasopharynx clear.  NECK:  Supple, no jugular venous distention. No thyroid enlargement, no tenderness.  LUNGS: Normal breath sounds bilaterally,  no wheezing, rales,rhonchi or crepitation. No use of accessory muscles of respiration. Decreased bibasilar breath sounds CARDIOVASCULAR: S1, S2 normal. No murmurs, rubs, or gallops.  ABDOMEN: Soft, minimally tender in mid abdomen, nondistended. Bowel sounds present. No organomegaly or mass.  EXTREMITIES: No pedal edema, cyanosis, or clubbing.  NEUROLOGIC: post-ictal and lethargic. Not following commands. Able to protect airway  PSYCHIATRIC: The patient is lethargic.  SKIN: No obvious rash, lesion, or ulcer.   LABORATORY PANEL:   CBC  Recent Labs Lab 04/15/17 1211  WBC 17.7*  HGB 11.2*  HCT 39.2  PLT 312   ------------------------------------------------------------------------------------------------------------------  Chemistries   Recent Labs Lab 04/15/17 1211  NA 124*  K 6.3*  CL 84*  CO2 <7*  GLUCOSE 1,085*  BUN 34*  CREATININE 1.64*  CALCIUM 9.2  AST 204*  ALT 84*  ALKPHOS 175*  BILITOT 1.0   ------------------------------------------------------------------------------------------------------------------  Cardiac Enzymes  Recent Labs Lab 04/15/17 1211  TROPONINI <0.03   ------------------------------------------------------------------------------------------------------------------  RADIOLOGY:  Dg Chest Port 1 View  Result Date: 04/15/2017 CLINICAL DATA:  Initial evaluation for acute CTA, abdominal pain. EXAM: PORTABLE CHEST 1 VIEW COMPARISON:  Prior radiograph from 03/09/2010. FINDINGS: Mild cardiomegaly, stable. Mediastinal silhouette within normal limits. Aortic atherosclerosis. Lungs mildly hypoinflated with elevation of the left hemidiaphragm. No focal infiltrates. No pulmonary edema or pleural effusion. No pneumothorax. No acute osseus abnormality.  Bones mildly osteopenic. IMPRESSION: 1. No active cardiopulmonary disease. 2. Aortic atherosclerosis. Electronically Signed   By: Jeannine Boga M.D.   On: 04/15/2017 13:06    EKG:   Orders  placed or performed during the hospital encounter of 04/15/17  . ED EKG  . ED EKG    IMPRESSION AND PLAN:   Michaela Johnson  is a 56 y.o. female with a known history of Hypertension, type 2 diabetes mellitus, alcohol abuse, smoking, depression and anxiety, neuropathy presents to the hospital secondary to altered mental status and abdominal pain.  #1 DKA- uncontrolled type 2 DM- severely acidotic, sugars >1000 - Admit to ICU, started on insulin drip -On fluids, bicarbonate drip. Follow-up Y3K  #2 metabolic acidosis-on bicarbonate drip. -Hyperkalemia has been corrected. Repeat BMP in 2 hours  #3 sepsis-elevated lactic acid and leukocytosis. -Came in with abdominal pain, when stable will need CT abdomen. -Prior history of acute pancreatitis from alcohol abuse. Continue to keep nothing by mouth, IV fluids -Empirically on Zosyn to cover for possible aspiration during her seizure episode and also intra-abdominal coverage at this time  #4 seizure-could be alcohol withdrawal. Not sure how much she drinks at baseline. -Alcohol level is negative. Urine drug screen is pending -CT of the head ordered -Continue Ativan when necessary. Still protecting her airway at this time.  #5  hypertension-hold enalapril and Cardizem due to hypotension currently  #6 hyponatremia-pseudohyponatremia from elevated sugars. Also dehydration, hypovolemia -IV fluids and monitor  #7 acute renal failure-secondary to underlying sepsis. IV fluids and monitor -Avoid nephrotoxins  #8 alcohol abuse-alcohol level is negative, needs counseling. -Drug screen ordered. Currently on  Ativan  #9 tobacco use disorder- we'll place a nicotine patch  #10 DVT prophylaxis-on Lovenox   Patient is critically ill at this time Plan to admit to ICU. Discussed with Dr. Juanell Fairly   All the records are reviewed and case discussed with ED provider. Management plans discussed with the patient, family and they are in agreement.  CODE  STATUS: Full Code  TOTAL CRITICAL CARE  TIME SPENT IN TAKING CARE OF THIS PATIENT: 60 minutes.    Gladstone Lighter M.D on 04/15/2017 at 2:33 PM  Between 7am to 6pm - Pager - [phone]  After 6pm go to [email or web address] - password [email or web address] [facility]  Office  [phone]  CC: Primary care physician; [name], NP

## 2017-04-15 NOTE — ED Notes (Signed)
Attempted to call report, staffing issue they are to call back.

## 2017-04-15 NOTE — ED Notes (Signed)
Pt noted having visual and auditory hallucinations. Pt is lethargic, mumbling.Marland Kitchen

## 2017-04-15 NOTE — Consult Note (Signed)
Name: Michaela Johnson MRN: [id number] DOB: [birth year]    ADMISSION DATE:  04/15/2017 CONSULTATION DATE: 04/15/2017  REFERRING MD : Dr. Tressia Miners  CHIEF COMPLAINT: Hyperglycemia   BRIEF PATIENT DESCRIPTION:  56 yo female admitted 09/26 with acute encephalopathy, hypothermia DKA, lactic acidosis, acute renal failure, hyponatremia, seizure activity, and  SIGNIFICANT EVENTS  09/26-Pt admitted to stepdown unit  STUDIES:  CT Head 09/26>>Mild diffuse cortical atrophy. No acute intracranial abnormality seen.  HISTORY OF PRESENT ILLNESS:   This is a 56 yo female with a PMH of HTN, ETOH Abuse, Diabetes Mellitus, DKA, Tobacco Abuse, Neuropathy, GERD, HTN, and Depression.  She presented to [facility] ER 09/26 with c/o vomiting, feeling weak, abdominal pain, and dehydrated onset of symptoms 2-3 days prior to presentation to the ER.  She has not had anything to eat in 2 days and has not been taking her insulin. She notified EMS due to symptoms and was found to be hyperglycemic. In the ER she had a witnessed seizure and received ativan and placed on NRB, she was postictal post seizure.  Lab results revealed serum glucose 1,085, Na+ 124, K+ 6.3, CO2 <7, creatinine 1.64, lactic acid 4.2, wbc 17.7, and temp 91.8 F  ruling pt in for DKA and possible infection although source unknown.  She was subsequently started on an insulin gtt and admitted to the stepdown unit for further workup and treatment PCCM consulted.    PAST MEDICAL HISTORY :   has a past medical history of Diabetes mellitus without complication ([place]) and Hypertension.  has no past surgical history on file. Prior to Admission medications   Medication Sig Start Date End Date Taking? Authorizing Provider  citalopram (CELEXA) 20 MG tablet Take 1 tablet by mouth daily. 05/13/16 05/13/17  [provider]  diltiazem (CARDIZEM CD) 120 MG 24 hr capsule Take 1 capsule (120 mg total) by mouth daily. Patient not taking: Reported on 04/15/2017 06/11/16    Dustin Flock, MD  enalapril (VASOTEC) 20 MG tablet Take 0.5 tablets (10 mg total) by mouth daily. 06/10/16   Dustin Flock, MD  fluticasone (FLONASE) 50 MCG/ACT nasal spray Place 2 sprays into both nostrils daily.    [provider]  insulin aspart (NOVOLOG) 100 UNIT/ML FlexPen Inject 5 Units into the skin 3 (three) times daily with meals.    [provider]  Insulin Detemir (LEVEMIR FLEXPEN [place]) Inject 40 Units into the skin at bedtime.     [provider]  Multiple Vitamin (MULTI-VITAMINS) TABS Take 1 tablet by mouth daily.    [provider]  Omega-3 Fatty Acids (FISH OIL PO) Take 1 capsule by mouth daily.    [provider]  omeprazole (PRILOSEC) 20 MG capsule Take 1 capsule by mouth daily.    [provider]   No Known Allergies  FAMILY HISTORY:  family history is not on file. SOCIAL HISTORY:  reports that she has been smoking.  She has never used smokeless tobacco.  REVIEW OF SYSTEMS:  Unable to assess pt remains postictal.  SUBJECTIVE:  Pt remains postictal   VITAL SIGNS: Temp:  [91.6 F (33.1 C)-91.8 F (33.2 C)] 91.8 F (33.2 C) (09/26 1400) Pulse Rate:  [85-118] 118 (09/26 1400) Resp:  [16-32] 19 (09/26 1400) BP: (68-91)/(37-66) 68/46 (09/26 1354) SpO2:  [84 %-100 %] 100 % (09/26 1400) Weight:  [56.7 kg (125 lb)] 56.7 kg (125 lb) (09/26 1200)  PHYSICAL EXAMINATION: General: acutely ill appearing Caucasian female, NAD Neuro: lethargic, attempting to follow simple commands, withdraws  from painful stimulation, PERRL HEENT: supple, no JVD Cardiovascular: s1s2, RRR, no M/R/G Lungs: clear throughout, even, non labored Abdomen: hypoactive BS x4, soft, non tender, non distended  Musculoskeletal: normal bulk and tone, no edema  Skin: intact no rashes or lesions    Recent Labs Lab 04/15/17 1211  NA 124*  K 6.3*  CL 84*  CO2 <7*  BUN 34*  CREATININE 1.64*  GLUCOSE 1,085*    Recent Labs Lab 04/15/17 1211   HGB 11.2*  HCT 39.2  WBC 17.7*  PLT 312   Dg Chest Port 1 View  Result Date: 04/15/2017 CLINICAL DATA:  Initial evaluation for acute CTA, abdominal pain. EXAM: PORTABLE CHEST 1 VIEW COMPARISON:  Prior radiograph from 03/09/2010. FINDINGS: Mild cardiomegaly, stable. Mediastinal silhouette within normal limits. Aortic atherosclerosis. Lungs mildly hypoinflated with elevation of the left hemidiaphragm. No focal infiltrates. No pulmonary edema or pleural effusion. No pneumothorax. No acute osseus abnormality.  Bones mildly osteopenic. IMPRESSION: 1. No active cardiopulmonary disease. 2. Aortic atherosclerosis. Electronically Signed   By: Jeannine Boga M.D.   On: 04/15/2017 13:06    ASSESSMENT / PLAN: Diabetic Ketoacidosis with metabolic acidosis  Possible sepsis although source unclear  Pseudohyperkalemia and Pseudohyponatremia secondary to DKA Seizure Activity may be secondary to ETOH withdrawal Acute Encephalopathy secondary to seizure  Leukocytosis and Lactic Acidosis  Acute renal failure secondary to volume depletion Transaminitis with elevated alk phosphatase  Hx: ETOH Abuse  P: Continue insulin gtt until anion gap closed and serum CO2 >20 BMP's q4hrs and CBG's q1hr while on insulin gtt Continue NS '@125'  ml/hr will transition to D5W1/2NS '@75'  ml/hr once CBG's <250 mg/dL  Will discontinue Sodium Bicarb gtt  Diabetes Coordinator consulted appreciate input Follow hypoglycemic protocol  Prn ativan for seizure activity Seizure precautions  Continuous telemetry monitoring  Trend BMP Replace electrolytes as indicated  Monitor UOP Repeat CMP in am  Hepatic Panel pending  Continue abx  Follow cultures Trend WBC and monitor fever curve Trend PCT and lactic acid  Trend CBC Lovenox for VTE prophylaxis Monitor for s/sx of bleeding  Keep NPO for now  Urine drug screen pending  Will need ETOH and tobacco abuse cessation counseling once mentation improves  Marda Stalker, [place] Pager [phone] (please enter 7 digits) PCCM Consult Pager [phone] (please enter 7 digits)  STAFF NOTE: I. Dr. Ashby [facility], have personally reviewed the patient's available data including medical history , events of notes, physican examination and test results as part of my evaluation. I have discussed with the  Care with the NP and other care providers including  pharmacist, ICU RN, RRT, dietary.  Physical Exam Lungs - Decreased air entry bilaterally.   Pt presented with unresponsiveness, s/p seizure. Glucose found to be very elevated with AKI, severe dehydration, multiple electroyte abn c.w severe DKA. Will continue IVF, insulin infusion. Pt also has history of etoh abuse, will monitor for signs of withdrawal.   Marda Stalker, MD.   Board Certified in Internal Medicine, Pulmonary Medicine, [place], and Sleep Medicine.  [facility] Office Number: [phone] Pager: [phone]  Patricia Pesa, M.D.  Merton Border, M.D 04/15/2017

## 2017-04-15 NOTE — Progress Notes (Signed)
Pharmacy Antibiotic Note  Michaela Johnson is a 56 y.o. female admitted on 04/15/2017 with sepsis.  Pharmacy has been consulted for Zosyn dosing.  Plan: Zosyn 3.375g IV q8h (4 hour infusion). CrCL > 20 mL/min so no dose adjustment at this point  Height: 5\' 2"  (157.5 cm) Weight: 125 lb (56.7 kg) IBW/kg (Calculated) : 50.1  Temp (24hrs), Avg:91.8 F (33.2 C), Min:91.6 F (33.1 C), Max:92 F (33.3 C)   Recent Labs Lab 04/15/17 1154 04/15/17 1211  WBC  --  17.7*  CREATININE  --  1.64*  LATICACIDVEN 4.2*  --     Estimated Creatinine Clearance: 29.9 mL/min (A) (by C-G formula based on SCr of 1.64 mg/dL (H)).    No Known Allergies  Thank you for allowing pharmacy to be a part of this patient's care.  Laural Benes, Pharm.D., BCPS Clinical Pharmacist 04/15/2017 2:31 PM

## 2017-04-15 NOTE — ED Notes (Signed)
Placed warming blanket on patient d/t low body temp as measured from foley temperature

## 2017-04-16 LAB — BASIC METABOLIC PANEL
ANION GAP: 27 — AB (ref 5–15)
ANION GAP: 9 (ref 5–15)
BUN: 32 mg/dL — ABNORMAL HIGH (ref 6–20)
BUN: 27 mg/dL — ABNORMAL HIGH (ref 6–20)
CALCIUM: 8.4 mg/dL — AB (ref 8.9–10.3)
CHLORIDE: 97 mmol/L — AB (ref 101–111)
CO2: 8 mmol/L — AB (ref 22–32)
CO2: 25 mmol/L (ref 22–32)
Calcium: 8 mg/dL — ABNORMAL LOW (ref 8.9–10.3)
Chloride: 106 mmol/L (ref 101–111)
Creatinine, Ser: 1.5 mg/dL — ABNORMAL HIGH (ref 0.44–1.00)
Creatinine, Ser: 0.94 mg/dL (ref 0.44–1.00)
GFR calc non Af Amer: 38 mL/min — ABNORMAL LOW (ref >60)
GFR, EST AFRICAN AMERICAN: 44 mL/min — AB (ref >60)
GLUCOSE: 145 mg/dL — AB (ref 65–99)
Glucose, Bld: 728 mg/dL (ref 65–99)
POTASSIUM: 4.1 mmol/L (ref 3.5–5.1)
POTASSIUM: 3.6 mmol/L (ref 3.5–5.1)
SODIUM: 132 mmol/L — AB (ref 135–145)
SODIUM: 140 mmol/L (ref 135–145)

## 2017-04-16 LAB — CBC
HCT: 30.8 % — ABNORMAL LOW (ref 35.0–47.0)
HEMOGLOBIN: 11 g/dL — AB (ref 12.0–16.0)
MCH: 35.5 pg — ABNORMAL HIGH (ref 26.0–34.0)
MCHC: 35.6 g/dL (ref 32.0–36.0)
MCV: 99.8 fL (ref 80.0–100.0)
Platelets: 231 10*3/uL (ref 150–440)
RBC: 3.09 MIL/uL — ABNORMAL LOW (ref 3.80–5.20)
RDW: 13.6 % (ref 11.5–14.5)
WBC: 14.8 10*3/uL — AB (ref 3.6–11.0)

## 2017-04-16 LAB — GLUCOSE, CAPILLARY
GLUCOSE-CAPILLARY: 123 mg/dL — AB (ref 65–99)
GLUCOSE-CAPILLARY: 133 mg/dL — AB (ref 65–99)
GLUCOSE-CAPILLARY: 156 mg/dL — AB (ref 65–99)
GLUCOSE-CAPILLARY: 161 mg/dL — AB (ref 65–99)
GLUCOSE-CAPILLARY: 169 mg/dL — AB (ref 65–99)
Glucose-Capillary: 171 mg/dL — ABNORMAL HIGH (ref 65–99)
Glucose-Capillary: 135 mg/dL — ABNORMAL HIGH (ref 65–99)
Glucose-Capillary: 141 mg/dL — ABNORMAL HIGH (ref 65–99)
Glucose-Capillary: 168 mg/dL — ABNORMAL HIGH (ref 65–99)
Glucose-Capillary: 142 mg/dL — ABNORMAL HIGH (ref 65–99)
Glucose-Capillary: 156 mg/dL — ABNORMAL HIGH (ref 65–99)
Glucose-Capillary: 159 mg/dL — ABNORMAL HIGH (ref 65–99)
Glucose-Capillary: 126 mg/dL — ABNORMAL HIGH (ref 65–99)
Glucose-Capillary: 95 mg/dL (ref 65–99)
Glucose-Capillary: 110 mg/dL — ABNORMAL HIGH (ref 65–99)

## 2017-04-16 LAB — URINE CULTURE: CULTURE: NO GROWTH

## 2017-04-16 LAB — HEPATITIS PANEL, ACUTE
HEP A IGM: NEGATIVE
HEP B C IGM: NEGATIVE
Hepatitis B Surface Ag: NEGATIVE

## 2017-04-16 LAB — LACTIC ACID, PLASMA: LACTIC ACID, VENOUS: 1.9 mmol/L (ref 0.5–1.9)

## 2017-04-16 LAB — HIV ANTIBODY (ROUTINE TESTING W REFLEX): HIV SCREEN 4TH GENERATION: NONREACTIVE

## 2017-04-16 LAB — MAGNESIUM: MAGNESIUM: 2.4 mg/dL (ref 1.7–2.4)

## 2017-04-16 LAB — PROCALCITONIN: PROCALCITONIN: 1.88 ng/mL

## 2017-04-16 MED ORDER — CITALOPRAM HYDROBROMIDE 20 MG PO TABS
20.0000 mg | ORAL_TABLET | Freq: Every day | ORAL | Status: DC
  Administered 2017-04-16 – 2017-04-17 (×2): 20 mg via ORAL
  Filled 2017-04-16 (×2): qty 1

## 2017-04-16 MED ORDER — INSULIN ASPART 100 UNIT/ML ~~LOC~~ SOLN
0.0000 [IU] | Freq: Three times a day (TID) | SUBCUTANEOUS | Status: DC
  Administered 2017-04-16: 3 [IU] via SUBCUTANEOUS
  Administered 2017-04-17: 2 [IU] via SUBCUTANEOUS
  Filled 2017-04-16 (×2): qty 1

## 2017-04-16 MED ORDER — FLUTICASONE PROPIONATE 50 MCG/ACT NA SUSP
2.0000 | Freq: Every day | NASAL | Status: DC
  Administered 2017-04-16 – 2017-04-17 (×2): 2 via NASAL
  Filled 2017-04-16: qty 16

## 2017-04-16 MED ORDER — INSULIN DETEMIR 100 UNIT/ML ~~LOC~~ SOLN
40.0000 [IU] | Freq: Every day | SUBCUTANEOUS | Status: DC
  Administered 2017-04-16 – 2017-04-17 (×2): 40 [IU] via SUBCUTANEOUS
  Filled 2017-04-16 (×3): qty 0.4

## 2017-04-16 MED ORDER — PANTOPRAZOLE SODIUM 40 MG PO TBEC
40.0000 mg | DELAYED_RELEASE_TABLET | Freq: Every day | ORAL | Status: DC
  Administered 2017-04-16 – 2017-04-17 (×2): 40 mg via ORAL
  Filled 2017-04-16 (×2): qty 1

## 2017-04-16 MED ORDER — ENALAPRIL MALEATE 10 MG PO TABS
10.0000 mg | ORAL_TABLET | Freq: Every day | ORAL | Status: DC
  Administered 2017-04-16 – 2017-04-17 (×2): 10 mg via ORAL
  Filled 2017-04-16 (×2): qty 1

## 2017-04-16 MED ORDER — INSULIN ASPART 100 UNIT/ML ~~LOC~~ SOLN
5.0000 [IU] | Freq: Three times a day (TID) | SUBCUTANEOUS | Status: DC
  Administered 2017-04-17 (×2): 5 [IU] via SUBCUTANEOUS
  Filled 2017-04-16 (×2): qty 1

## 2017-04-16 NOTE — Progress Notes (Signed)
Patient transferred to room 214. Report called to Brock, RN.

## 2017-04-16 NOTE — Progress Notes (Signed)
AM labs completed.

## 2017-04-16 NOTE — Care Management (Signed)
Patient is followed by [facility] and they provide medications also. Patient is listed as Self-pay.

## 2017-04-16 NOTE — Progress Notes (Signed)
Notified [place], NP at 2330 of lactic acid level of 4.4.  Marguarite Arbour, RN

## 2017-04-16 NOTE — Plan of Care (Signed)
Problem: Physical Regulation: Goal: Will not experience complications related to bowel motility Outcome: Progressing Pt with incontinent loose stools overnight.  Comments: Pt with loose stools overnight.  Pt continues to be on insulin gtt with blood sugars every hour.

## 2017-04-16 NOTE — Progress Notes (Addendum)
Inpatient Diabetes Program Recommendations  AACE/ADA: New Consensus Statement on Inpatient Glycemic Control (2015)  Target Ranges:  Prepandial:   less than 140 mg/dL      Peak postprandial:   less than 180 mg/dL (1-2 hours)      Critically ill patients:  140 - 180 mg/dL   Lab Results  Component Value Date   GLUCAP 168 (H) 04/16/2017   HGBA1C 9.4 (H) 06/07/2016    Review of Glycemic Control  Results for HAJER, DWYER (MRN [id number]) as of 04/16/2017 07:34  Ref. Range 04/16/2017 02:38 04/16/2017 03:44 04/16/2017 04:42 04/16/2017 05:46 04/16/2017 06:44  Glucose-Capillary Latest Ref Range: 65 - 99 mg/dL 133 (H) 123 (H) 135 (H) 141 (H) 168 (H)    Diabetes history: Type 2 Outpatient Diabetes medications: Levemir 40 qhs, Novolog 5 units tid (orders) Current orders for Inpatient glycemic control: Levemir 40 units qday, Novolog 0-15 units tid  Inpatient Diabetes Program Recommendations:  Anion gap 9, CO2  25- with MD order, please give Levemir now and continue IV insulin for 2 additional hours.  Then when IV insulin is d/c'd, give correction Novolog insulin per orders.   Consider adding 3 units tid with meals and decrease Novolog correction to sensitive correction 0-9 units tid.  Met with patient- she tells me she takes her insulin at home, Levemir 40 units qam and Novolog 5 units tid with meals using an insulin pen.  Rarely misses it (sometimes takes it after supper when she forgets to take it before).  Rare low blood sugars 2x/month. None through the night- during the day if they happen.   Gentry Fitz, RN, BA, MHA, CDE Diabetes Coordinator Inpatient Diabetes Program  [phone] (Team Pager) [phone] ([place]) 04/16/2017 7:39 AM

## 2017-04-16 NOTE — Care Management Note (Signed)
Case Management Note  Patient Details  Name: Lorraine Cimmino MRN: [id number] Date of Birth: May 23, 1961  Subjective/Objective:                    Action/Plan: Met with patient to discuss discharge planning. She is followed at [facility] but medication management can assist with medication needs also. Applications to [facility] shared with patient. She is not on supplemental O2 at home. She lives with her husband and depends on him for transportation. She states she is independent at home at baseline. She has no DME. She has been made aware of her appointment Oct 3 at [facility] and agrees.     Expected Discharge Date:                  Expected Discharge Plan:     In-House Referral:     Discharge planning Services  CM Consult, Medication Assistance, [facility]  Post Acute Care Choice:  Durable Medical Equipment Choice offered to:  Patient  DME Arranged:    DME Agency:     HH Arranged:    [place] Agency:     Status of Service:  In process, will continue to follow  If discussed at Long Length of Stay Meetings, dates discussed:    Additional Comments:  Marshell Garfinkel, RN 04/16/2017, 11:51 AM

## 2017-04-16 NOTE — Progress Notes (Signed)
[facility] NAME: Michaela Johnson    MR#:  [id number]  DATE OF BIRTH:  Apr 30, 1961  SUBJECTIVE:  CHIEF COMPLAINT:   Chief Complaint  Patient presents with  . Hyperglycemia   Fatigue. No abdominal pain, nausea or vomiting. Off insulin drip. REVIEW OF SYSTEMS:  Review of Systems  Constitutional: Positive for malaise/fatigue. Negative for chills and fever.  HENT: Negative for sore throat.   Eyes: Negative for blurred vision and double vision.  Respiratory: Negative for cough, hemoptysis, shortness of breath, wheezing and stridor.   Cardiovascular: Negative for chest pain, palpitations, orthopnea and leg swelling.  Gastrointestinal: Negative for abdominal pain, blood in stool, diarrhea, melena, nausea and vomiting.  Genitourinary: Negative for dysuria, flank pain and hematuria.  Musculoskeletal: Negative for back pain and joint pain.  Skin: Negative for rash.  Neurological: Negative for dizziness, sensory change, focal weakness, seizures, loss of consciousness, weakness and headaches.  Endo/Heme/Allergies: Negative for polydipsia.  Psychiatric/Behavioral: Negative for depression. The patient is not nervous/anxious.     DRUG ALLERGIES:  No Known Allergies VITALS:  Blood pressure 134/73, pulse 89, temperature 99.9 F (37.7 C), resp. rate 18, height 5\' 2"  (1.575 m), weight 137 lb 5.6 oz (62.3 kg), SpO2 96 %. PHYSICAL EXAMINATION:  Physical Exam  Constitutional: She is oriented to person, place, and time and well-developed, well-nourished, and in no distress.  HENT:  Head: Normocephalic.  Mouth/Throat: Oropharynx is clear and moist.  Eyes: Pupils are equal, round, and reactive to light. Conjunctivae and EOM are normal. No scleral icterus.  Neck: Normal range of motion. Neck supple. No JVD present. No tracheal deviation present.  Cardiovascular: Normal rate, regular rhythm and normal heart sounds.  Exam reveals no gallop.   No  murmur heard. Pulmonary/Chest: Effort normal and breath sounds normal. No respiratory distress. She has no wheezes. She has no rales.  Abdominal: Soft. Bowel sounds are normal. She exhibits no distension. There is no tenderness. There is no rebound.  Musculoskeletal: Normal range of motion. She exhibits no edema or tenderness.  Neurological: She is alert and oriented to person, place, and time. No cranial nerve deficit.  Skin: No rash noted. No erythema.  Psychiatric: Affect normal.   LABORATORY PANEL:  Female CBC  Recent Labs Lab 04/16/17 0606  WBC 14.8*  HGB 11.0*  HCT 30.8*  PLT 231   ------------------------------------------------------------------------------------------------------------------ Chemistries   Recent Labs Lab 04/15/17 1211  04/16/17 0606  NA 124*  < > 140  K 6.3*  < > 3.6  CL 84*  < > 106  CO2 <7*  < > 25  GLUCOSE 1,085*  < > 145*  BUN 34*  < > 27*  CREATININE 1.64*  < > 0.94  CALCIUM 9.2  < > 8.4*  MG  --   --  2.4  AST 204*  --   --   ALT 84*  --   --   ALKPHOS 175*  --   --   BILITOT 1.0  --   --   < > = values in this interval not displayed. RADIOLOGY:  Ct Head Wo Contrast  Result Date: 04/15/2017 CLINICAL DATA:  Seizure. EXAM: CT HEAD WITHOUT CONTRAST TECHNIQUE: Contiguous axial images were obtained from the base of the skull through the vertex without intravenous contrast. COMPARISON:  None. FINDINGS: Brain: Mild diffuse cortical atrophy is noted. No mass effect or midline shift is noted. Ventricular size is within normal limits. There is no evidence  of mass lesion, hemorrhage or acute infarction. Vascular: No hyperdense vessel or unexpected calcification. Skull: Normal. Negative for fracture or focal lesion. Sinuses/Orbits: No acute finding. Other: None. IMPRESSION: Mild diffuse cortical atrophy. No acute intracranial abnormality seen. Electronically Signed   By: Marijo Conception, M.D.   On: 04/15/2017 15:28   ASSESSMENT AND PLAN:   Michaela Johnson  is a 56 y.o. female with a known history of Hypertension, type 2 diabetes mellitus, alcohol abuse, smoking, depression and anxiety, neuropathy presents to the hospital secondary to altered mental status and abdominal pain.  #1 DKA- uncontrolled type 2 DM- severely acidotic, sugars >1000 Improved with insulin drip, IV fluids, bicarbonate drip. Started levemir and sliding scale.  #2 Lactic acidosis-on bicarbonate drip. Improved. -Hyperkalemia. Improved.  #3 sepsis-elevated lactic acid and leukocytosis. Due to reaction to DKA. Improved. The patient was treated with Zosyn and vancomycin. She has no complaints of abdominal pain. Lipase is normal.  #4 seizure-could be alcohol withdrawal. Not sure how much she drinks at baseline. -Alcohol level is negative. Urine drug screen is unremarkable. -CT of the head is unremarkable -Continue Ativan when necessary.  #5  hypertension- enalapril and Cardizem are hold due to hypotension.  #6 hyponatremia-pseudohyponatremia from elevated sugars. Also dehydration,  Improved.  #7 acute renal failure-secondary to dehydration, improved with V fluid.  #8 alcohol abuse-alcohol level is negative, needs counseling. -Drug screen is normal.  on Ativan prn.  Abnormal liver function test. Looks like chronic, stable.  #9 tobacco use disorder-  Smoking cessation was counseled for 3-4 minutes, nicotine patch   All the records are reviewed and case discussed with Care Management/Social Worker. Management plans discussed with the patient, family and they are in agreement.  CODE STATUS: Full Code  TOTAL TIME TAKING CARE OF THIS PATIENT: 41 minutes.   More than 50% of the time was spent in counseling/coordination of care: YES  POSSIBLE D/C IN 2 DAYS, DEPENDING ON CLINICAL CONDITION.   Demetrios Loll M.D on 04/16/2017 at 1:44 PM  Between 7am to 6pm - Pager - [phone]  After 6pm go to [email or web address] - Research scientist (physical sciences) Hospitalists

## 2017-04-16 NOTE — Progress Notes (Signed)
Pharmacy Antibiotic Note  Michaela Johnson is a 57 y.o. female admitted on 04/15/2017 with sepsis secondary to HCAP.  Pharmacy has been consulted for Zosyn dosing.  Plan: Zosyn 3.375g IV q8h (4 hour infusion).  Height: 5\' 2"  (157.5 cm) Weight: 137 lb 5.6 oz (62.3 kg) IBW/kg (Calculated) : 50.1  Temp (24hrs), Avg:96.2 F (35.7 C), Min:91.6 F (33.1 C), Max:100 F (37.8 C)   Recent Labs Lab 04/15/17 1154 04/15/17 1211 04/15/17 1618 04/15/17 1628 04/15/17 1916 04/15/17 2229 04/16/17 0606  WBC  --  17.7*  --   --   --   --  14.8*  CREATININE  --  1.64* 1.50*  --   --   --  0.94  LATICACIDVEN 4.2*  --   --  3.7* 5.0* 4.4* 1.9    Estimated Creatinine Clearance: 57.3 mL/min (by C-G formula based on SCr of 0.94 mg/dL).    No Known Allergies  Antimicrobials this admission: 9/26 Zosyn >>  9/26 Vancomycin x 1   Dose adjustments this admission: N/A  Microbiology results: 9/26 BCx: no growth < 24 hours 9/26 MRSA PCR: negative  Thank you for allowing pharmacy to be a part of this patient's care.  Durwin Reges, PharmD Student  04/16/2017 11:51 AM

## 2017-04-17 ENCOUNTER — Inpatient Hospital Stay (HOSPITAL_COMMUNITY): Payer: Self-pay

## 2017-04-17 DIAGNOSIS — R569 Unspecified convulsions: Secondary | ICD-10-CM

## 2017-04-17 LAB — BASIC METABOLIC PANEL
Anion gap: 7 (ref 5–15)
BUN: 10 mg/dL (ref 6–20)
CO2: 26 mmol/L (ref 22–32)
CREATININE: 0.58 mg/dL (ref 0.44–1.00)
Calcium: 8.2 mg/dL — ABNORMAL LOW (ref 8.9–10.3)
Chloride: 109 mmol/L (ref 101–111)
Glucose, Bld: 108 mg/dL — ABNORMAL HIGH (ref 65–99)
Potassium: 3 mmol/L — ABNORMAL LOW (ref 3.5–5.1)
Sodium: 142 mmol/L (ref 135–145)

## 2017-04-17 LAB — GLUCOSE, CAPILLARY
Glucose-Capillary: 130 mg/dL — ABNORMAL HIGH (ref 65–99)
Glucose-Capillary: 107 mg/dL — ABNORMAL HIGH (ref 65–99)

## 2017-04-17 LAB — PROCALCITONIN: PROCALCITONIN: 0.74 ng/mL

## 2017-04-17 LAB — C DIFFICILE QUICK SCREEN W PCR REFLEX
C DIFFICILE (CDIFF) TOXIN: NEGATIVE
C DIFFICLE (CDIFF) ANTIGEN: NEGATIVE
C Diff interpretation: NOT DETECTED

## 2017-04-17 MED ORDER — INFLUENZA VAC SPLIT QUAD 0.5 ML IM SUSY: 0.5000 mL | PREFILLED_SYRINGE | INTRAMUSCULAR | Status: DC

## 2017-04-17 MED ORDER — POTASSIUM CHLORIDE CRYS ER 20 MEQ PO TBCR
60.0000 meq | EXTENDED_RELEASE_TABLET | Freq: Once | ORAL | Status: AC
Start: 2017-04-17 — End: 2017-04-17
  Administered 2017-04-17: 60 meq via ORAL
  Filled 2017-04-17: qty 3

## 2017-04-17 NOTE — Progress Notes (Signed)
Niota visited with patient per order requests. Patient was feeling isolated as she was on precautions. Patient stated she has been sick with some kind of stomach bug. CH provided prayer, emotional support, and spent time with the patient.    04/17/17 1100  Clinical Encounter Type  Visited With Patient  Visit Type Initial;Spiritual support  Referral From Nurse

## 2017-04-17 NOTE — Consult Note (Signed)
Reason for Consult:Seizure Referring Physician: Bridgett Larsson  CC: Seizure  HPI: Michaela Johnson is an 56 y.o. female with a history of DM and ETOH use who presented on 9/26 in DKA with vomiting, weakness.  Reported not taking her insulin in 2 days.  CBG>1000.  Patient acidotic and hyponatremic as well.  Noted to have a seizure in the ED.  Was admitted for further treatment of DKA.  Has had no further seizures.    Past Medical History:  Diagnosis Date  . Alcohol use   . Depression   . Diabetes mellitus without complication ([place])   . GERD (gastroesophageal reflux disease)   . Hypertension   . Neuropathy   . Tobacco use     History reviewed. No pertinent surgical history.  Family History  Problem Relation Age of Onset  . Family history unknown: Yes    Social History:  reports that she has been smoking.  She has never used smokeless tobacco. She reports that she drinks alcohol. Her drug history is not on file.  No Known Allergies  Medications:  I have reviewed the patient's current medications. Prior to Admission:  Prescriptions Prior to Admission  Medication Sig Dispense Refill Last Dose  . citalopram (CELEXA) 20 MG tablet Take 1 tablet by mouth daily.   unknown at unknown  . diltiazem (CARDIZEM CD) 120 MG 24 hr capsule Take 1 capsule (120 mg total) by mouth daily. (Patient not taking: Reported on 04/15/2017) 30 capsule 0 Not Taking at Unknown time  . enalapril (VASOTEC) 20 MG tablet Take 0.5 tablets (10 mg total) by mouth daily.   unknown at unknown  . fluticasone (FLONASE) 50 MCG/ACT nasal spray Place 2 sprays into both nostrils daily.   unknown at unknown  . insulin aspart (NOVOLOG) 100 UNIT/ML FlexPen Inject 5 Units into the skin 3 (three) times daily with meals.   unknown at unknown  . Insulin Detemir (LEVEMIR FLEXPEN [place]) Inject 40 Units into the skin at bedtime.    unknown at unknown  . Multiple Vitamin (MULTI-VITAMINS) TABS Take 1 tablet by mouth daily.   unknown at unknown  .  Omega-3 Fatty Acids (FISH OIL PO) Take 1 capsule by mouth daily.   unknown at unknown  . omeprazole (PRILOSEC) 20 MG capsule Take 1 capsule by mouth daily.   unknown at unknown   Scheduled: . citalopram  20 mg Oral Daily  . enalapril  10 mg Oral Daily  . enoxaparin (LOVENOX) injection  40 mg Subcutaneous Q24H  . fluticasone  2 spray Each Nare Daily  . [START ON 04/18/2017] Influenza vac split quadrivalent PF  0.5 mL Intramuscular Tomorrow-1000  . insulin aspart  0-15 Units Subcutaneous TID WC  . insulin aspart  5 Units Subcutaneous TID WC  . insulin detemir  40 Units Subcutaneous Daily  . pantoprazole  40 mg Oral Daily    ROS: History obtained from the patient  General ROS: negative for - chills, fatigue, fever, night sweats, weight gain or weight loss Psychological ROS: negative for - behavioral disorder, hallucinations, memory difficulties, mood swings or suicidal ideation Ophthalmic ROS: negative for - blurry vision, double vision, eye pain or loss of vision ENT ROS: negative for - epistaxis, nasal discharge, oral lesions, sore throat, tinnitus or vertigo Allergy and Immunology ROS: negative for - hives or itchy/watery eyes Hematological and Lymphatic ROS: negative for - bleeding problems, bruising or swollen lymph nodes Endocrine ROS: negative for - galactorrhea, hair pattern changes, polydipsia/polyuria or temperature intolerance Respiratory ROS: negative  for - cough, hemoptysis, shortness of breath or wheezing Cardiovascular ROS: negative for - chest pain, dyspnea on exertion, edema or irregular heartbeat Gastrointestinal ROS: abdominal pain, diarrhea, nausea/vomiting Genito-Urinary ROS: negative for - dysuria, hematuria, incontinence or urinary frequency/urgency Musculoskeletal ROS: generalized weakness Neurological ROS: as noted in HPI Dermatological ROS: negative for rash and skin lesion changes  Physical Examination: Blood pressure (!) 163/98, pulse 72, temperature 97.8 F  (36.6 C), temperature source Oral, resp. rate (!) 21, height 5\' 2"  (1.575 m), weight 62.3 kg (137 lb 5.6 oz), SpO2 94 %.  HEENT-  Normocephalic, no lesions, without obvious abnormality.  Normal external eye and conjunctiva.  Normal TM's bilaterally.  Normal auditory canals and external ears. Normal external nose, mucus membranes and septum.  Normal pharynx. Cardiovascular- S1, S2 normal, pulses palpable throughout   Lungs- chest clear, no wheezing, rales, normal symmetric air entry Abdomen- soft, non-tender; bowel sounds normal; no masses,  no organomegaly Extremities- no edema Lymph-no adenopathy palpable Musculoskeletal-no joint tenderness, deformity or swelling Skin-warm and dry, no hyperpigmentation, vitiligo, or suspicious lesions  Neurological Examination   Mental Status: Alert, oriented, thought content appropriate.  Speech fluent without evidence of aphasia.  Able to follow 3 step commands without difficulty. Cranial Nerves: II: Discs flat bilaterally; Visual fields grossly normal, pupils equal, round, reactive to light and accommodation III,IV, VI: ptosis not present, extra-ocular motions intact bilaterally V,VII: smile symmetric, facial light touch sensation normal bilaterally VIII: hearing normal bilaterally IX,X: gag reflex present XI: bilateral shoulder shrug XII: midline tongue extension Motor: Right : Upper extremity   5/5    Left:     Upper extremity   5/5  Lower extremity   5/5     Lower extremity   5/5 Tone and bulk:normal tone throughout; no atrophy noted Sensory: Pinprick and light touch intact throughout, bilaterally Deep Tendon Reflexes: 2+ in the upper extremities and absent in the lower extremities Plantars: Right: mute   Left: mute Cerebellar: Normal finger-to-nose and normal heel-to-shin testing bilaterally Gait: normal gait and station    Laboratory Studies:   Basic Metabolic Panel:  Recent Labs Lab 04/15/17 1211 04/15/17 1618 04/16/17 0606  04/17/17 0405  NA 124* 132* 140 142  K 6.3* 4.1 3.6 3.0*  CL 84* 97* 106 109  CO2 <7* 8* 25 26  GLUCOSE 1,085* 728* 145* 108*  BUN 34* 32* 27* 10  CREATININE 1.64* 1.50* 0.94 0.58  CALCIUM 9.2 8.0* 8.4* 8.2*  MG  --   --  2.4  --   PHOS  --  5.2*  --   --     Liver Function Tests:  Recent Labs Lab 04/15/17 1211  AST 204*  ALT 84*  ALKPHOS 175*  BILITOT 1.0  PROT 7.7  ALBUMIN 3.9    Recent Labs Lab 04/15/17 1211  LIPASE 18   No results for input(s): AMMONIA in the last 168 hours.  CBC:  Recent Labs Lab 04/15/17 1211 04/16/17 0606  WBC 17.7* 14.8*  NEUTROABS 16.1*  --   HGB 11.2* 11.0*  HCT 39.2 30.8*  MCV 123.0* 99.8  PLT 312 231    Cardiac Enzymes:  Recent Labs Lab 04/15/17 1211  TROPONINI <0.03    BNP: Invalid input(s): POCBNP  CBG:  Recent Labs Lab 04/16/17 1143 04/16/17 1542 04/16/17 1732 04/16/17 2259 04/17/17 0748  GLUCAP 156* 126* 95 110* 130*    Microbiology: Results for orders placed or performed during the hospital encounter of 04/15/17  Urine culture  Status: None   Collection Time: 04/15/17  1:45 PM  Result Value Ref Range Status   Specimen Description URINE, RANDOM  Final   Special Requests NONE  Final   Culture   Final    NO GROWTH Performed at [facility], [place] [address] [place], [place] [address]    Report Status 04/16/2017 FINAL  Final  Culture, blood (routine x 2)     Status: None (Preliminary result)   Collection Time: 04/15/17  2:17 PM  Result Value Ref Range Status   Specimen Description BLOOD BLOOD LEFT HAND  Final   Special Requests   Final    BOTTLES DRAWN AEROBIC AND ANAEROBIC Blood Culture adequate volume   Culture NO GROWTH 2 DAYS  Final   Report Status PENDING  Incomplete  Culture, blood (routine x 2)     Status: None (Preliminary result)   Collection Time: 04/15/17  2:17 PM  Result Value Ref Range Status   Specimen Description BLOOD A-LINE DRAW  Final   Special Requests   Final     BOTTLES DRAWN AEROBIC AND ANAEROBIC Blood Culture adequate volume   Culture NO GROWTH 2 DAYS  Final   Report Status PENDING  Incomplete  MRSA PCR Screening     Status: None   Collection Time: 04/15/17  4:01 PM  Result Value Ref Range Status   MRSA by PCR NEGATIVE NEGATIVE Final    Comment:        The GeneXpert MRSA Assay (FDA approved for NASAL specimens only), is one component of a comprehensive MRSA colonization surveillance program. It is not intended to diagnose MRSA infection nor to guide or monitor treatment for MRSA infections.     Coagulation Studies: No results for input(s): LABPROT, INR in the last 72 hours.  Urinalysis:  Recent Labs Lab 04/15/17 1345  COLORURINE STRAW*  LABSPEC 1.018  PHURINE 5.0  GLUCOSEU >=500*  HGBUR SMALL*  BILIRUBINUR NEGATIVE  KETONESUR 80*  PROTEINUR 30*  NITRITE NEGATIVE  LEUKOCYTESUR NEGATIVE    Lipid Panel:     Component Value Date/Time   TRIG 144 06/08/2016 0557    HgbA1C:  Lab Results  Component Value Date   HGBA1C 9.4 (H) 06/07/2016    Urine Drug Screen:     Component Value Date/Time   LABOPIA NONE DETECTED 04/15/2017 1345   COCAINSCRNUR NONE DETECTED 04/15/2017 1345   LABBENZ NONE DETECTED 04/15/2017 1345   AMPHETMU NONE DETECTED 04/15/2017 1345   THCU NONE DETECTED 04/15/2017 1345   LABBARB NONE DETECTED 04/15/2017 1345    Alcohol Level:  Recent Labs Lab 04/15/17 1211  ETH <10     Imaging: Ct Head Wo Contrast  Result Date: 04/15/2017 CLINICAL DATA:  Seizure. EXAM: CT HEAD WITHOUT CONTRAST TECHNIQUE: Contiguous axial images were obtained from the base of the skull through the vertex without intravenous contrast. COMPARISON:  None. FINDINGS: Brain: Mild diffuse cortical atrophy is noted. No mass effect or midline shift is noted. Ventricular size is within normal limits. There is no evidence of mass lesion, hemorrhage or acute infarction. Vascular: No hyperdense vessel or unexpected calcification. Skull:  Normal. Negative for fracture or focal lesion. Sinuses/Orbits: No acute finding. Other: None. IMPRESSION: Mild diffuse cortical atrophy. No acute intracranial abnormality seen. Electronically Signed   By: Marijo Conception, M.D.   On: 04/15/2017 15:28   Dg Chest Port 1 View  Result Date: 04/15/2017 CLINICAL DATA:  Initial evaluation for acute CTA, abdominal pain. EXAM: PORTABLE CHEST 1 VIEW COMPARISON:  Prior radiograph from 03/09/2010. FINDINGS: Mild cardiomegaly, stable. Mediastinal silhouette within normal limits. Aortic atherosclerosis. Lungs mildly hypoinflated with elevation of the left hemidiaphragm. No focal infiltrates. No pulmonary edema or pleural effusion. No pneumothorax. No acute osseus abnormality.  Bones mildly osteopenic. IMPRESSION: 1. No active cardiopulmonary disease. 2. Aortic atherosclerosis. Electronically Signed   By: Jeannine Boga M.D.   On: 04/15/2017 13:06     Assessment/Plan: 56 y.o. female with a history of DM and ETOH use who presented on 9/26 in DKA with vomiting, weakness.  Reported not taking her insulin in 2 days.  CBG>1000.  Patient acidotic and hyponatremic as well.  Noted to have a seizure in the ED.  Was admitted for further treatment of DKA.  Has had no further seizures.  Head CT reviewed and shows no acute changes.  Seizure likely provoked as a consequence of DKA and/or ETOH withdrawal.    Recommendations: 1. EEG  Alexis Goodell, MD Neurology [phone] 04/17/2017, 9:52 AM   Addendum: EEG shows no epileptiform activity.  With seizure likely being provoked anticonvulsant therapy not indicated at this time.   No further neurologic intervention is recommended at this time.  If further questions arise, please call or page at that time.  Thank you for allowing neurology to participate in the care of this patient.  Alexis Goodell, MD Neurology [phone] 04/17/2017  1:38 PM

## 2017-04-17 NOTE — Discharge Instructions (Signed)
Heart healthy and ADA diet. Smoking and alcohol cessation

## 2017-04-17 NOTE — Progress Notes (Signed)
Pharmacy Antibiotic Note  Michaela Johnson is a 56 y.o. female admitted on 04/15/2017 with sepsis. Pharmacy has been consulted for piperacillin/tazobactam dosing.  This is day #3 of antibiotics.  Plan: Continue piperacillin/tazobactam 3.375 g IV q8h EI  Height: 5\' 2"  (157.5 cm) Weight: 137 lb 5.6 oz (62.3 kg) IBW/kg (Calculated) : 50.1  Temp (24hrs), Avg:99.6 F (37.6 C), Min:97.8 F (36.6 C), Max:100.2 F (37.9 C)   Recent Labs Lab 04/15/17 1154 04/15/17 1211 04/15/17 1618 04/15/17 1628 04/15/17 1916 04/15/17 2229 04/16/17 0606 04/17/17 0405  WBC  --  17.7*  --   --   --   --  14.8*  --   CREATININE  --  1.64* 1.50*  --   --   --  0.94 0.58  LATICACIDVEN 4.2*  --   --  3.7* 5.0* 4.4* 1.9  --     Estimated Creatinine Clearance: 67.4 mL/min (by C-G formula based on SCr of 0.58 mg/dL).    No Known Allergies  Antimicrobials this admission: Piperacillin/tazobactam 9/26 >>  Microbiology results: 9/26 BCx: no growth 2 days 9/26 MRSA PCR: negative 9/26 Urine: negative, final  Thank you for allowing pharmacy to be a part of this patient's care.  Lenis Noon, PharmD, BCPS Clinical Pharmacist 04/17/2017 10:03 AM

## 2017-04-17 NOTE — Progress Notes (Signed)
Patient discharge teaching given, including activity, diet, follow-up appoints, and medications. Patient verbalized understanding of all discharge instructions. IV access was d/c'd. Vitals are stable. Skin is intact except as charted in most recent assessments. Pt to be escorted out by volunteer, to be driven home by family.  Michaela Johnson  
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
## 2017-04-17 NOTE — Care Management (Signed)
Patient not discharging on any medications.  Patient has follow up appointment in place.  RNCM signing off.

## 2017-04-17 NOTE — Discharge Summary (Signed)
[facility] NAME: Michaela Johnson    MR#:  [id number]  DATE OF BIRTH:  [birth year]  DATE OF ADMISSION:  04/15/2017   ADMITTING PHYSICIAN: Gladstone Lighter, MD  DATE OF DISCHARGE: 04/17/2017 PRIMARY CARE PHYSICIAN: [name], NP   ADMISSION DIAGNOSIS:  DKA (diabetic ketoacidoses) ([place]) [E13.10] Diabetic ketoacidosis without coma associated with other specified diabetes mellitus ([place]) [E13.10] DISCHARGE DIAGNOSIS:  Active Problems:   DKA (diabetic ketoacidoses) ([place])   Seizure ([place])  SECONDARY DIAGNOSIS:   Past Medical History:  Diagnosis Date  . Alcohol use   . Depression   . Diabetes mellitus without complication ([place])   . GERD (gastroesophageal reflux disease)   . Hypertension   . Neuropathy   . Tobacco use    HOSPITAL COURSE:   Michaela Johnson a 56 y.o. femalewith a known history of Hypertension, type 2 diabetes mellitus, alcohol abuse, smoking, depression and anxiety, neuropathy presents to the hospital secondary to altered mental status and abdominal pain.  #1 DKA- uncontrolled type 2 DM- severely acidotic, sugars >1000 Improved with insulin drip, IV fluids, bicarbonate drip. Started levemir and sliding scale. BS is well controlled.  #2 Lactic acidosis-on bicarbonate drip. Improved. -Hyperkalemia. Improved. Hypokalemia. Give potassium supplement. Follow-up level as outpatient.  #3 sepsis-elevated lactic acid and leukocytosis. Due to reaction to DKA. Improved. The patient was treated with Zosyn and vancomycin. She has no complaints of abdominal pain. Lipase is normal.  #4 seizure-could be alcohol withdrawal. Not sure how much she drinks at baseline.  -Alcohol level is negative. Urine drug screen is unremarkable. -CT of the head is unremarkable -Continue Ativan when necessary. EEG shows no epileptiform activity.  With seizure likely being provoked anticonvulsant therapy not indicated at this time.     No further neurologic intervention is recommended at this time per Dr. Doy Mince.  #5 hypertension- enalapril and Cardizem are hold due to hypotension.  #6 hyponatremia-pseudohyponatremia from elevated sugars. Also dehydration,  Improved.  #7 acute renal failure-secondary to dehydration, improved with V fluid.  #8 alcohol abuse-alcohol level is negative, counselled -Drug screen is normal.  on Ativan prn.  Abnormal liver function test. Looks like chronic, stable.  #9 tobacco use disorder-  Smoking cessation was counseled for 3-4 minutes, nicotine patch  DISCHARGE CONDITIONS:  Stable, discharge to home today. CONSULTS OBTAINED:  Treatment Team:  Alexis Goodell, MD DRUG ALLERGIES:  No Known Allergies DISCHARGE MEDICATIONS:   Allergies as of 04/17/2017   No Known Allergies     Medication List    TAKE these medications   citalopram 20 MG tablet Commonly known as:  CELEXA Take 1 tablet by mouth daily.   diltiazem 120 MG 24 hr capsule Commonly known as:  CARDIZEM CD Take 1 capsule (120 mg total) by mouth daily.   enalapril 20 MG tablet Commonly known as:  VASOTEC Take 0.5 tablets (10 mg total) by mouth daily.   FISH OIL PO Take 1 capsule by mouth daily.   fluticasone 50 MCG/ACT nasal spray Commonly known as:  FLONASE Place 2 sprays into both nostrils daily.   insulin aspart 100 UNIT/ML FlexPen Commonly known as:  NOVOLOG Inject 5 Units into the skin 3 (three) times daily with meals.   LEVEMIR FLEXPEN [place] Inject 40 Units into the skin at bedtime.   MULTI-VITAMINS Tabs Take 1 tablet by mouth daily.   omeprazole 20 MG capsule Commonly known as:  PRILOSEC Take 1 capsule by mouth daily.  Discharge Care Instructions        Start     Ordered   04/17/17 0000  Increase activity slowly     04/17/17 1100   04/17/17 0000  Diet - low sodium heart healthy     04/17/17 1100       DISCHARGE INSTRUCTIONS:  See AVS.  If you experience  worsening of your admission symptoms, develop shortness of breath, life threatening emergency, suicidal or homicidal thoughts you must seek medical attention immediately by calling 911 or calling your MD immediately  if symptoms less severe.  You Must read complete instructions/literature along with all the possible adverse reactions/side effects for all the Medicines you take and that have been prescribed to you. Take any new Medicines after you have completely understood and accpet all the possible adverse reactions/side effects.   Please note  You were cared for by a hospitalist during your hospital stay. If you have any questions about your discharge medications or the care you received while you were in the hospital after you are discharged, you can call the unit and asked to speak with the hospitalist on call if the hospitalist that took care of you is not available. Once you are discharged, your primary care physician will handle any further medical issues. Please note that NO REFILLS for any discharge medications will be authorized once you are discharged, as it is imperative that you return to your primary care physician (or establish a relationship with a primary care physician if you do not have one) for your aftercare needs so that they can reassess your need for medications and monitor your lab values.    On the day of Discharge:  VITAL SIGNS:  Blood pressure (!) 163/98, pulse 72, temperature 97.8 F (36.6 C), temperature source Oral, resp. rate (!) 21, height 5\' 2"  (1.575 m), weight 137 lb 5.6 oz (62.3 kg), SpO2 94 %. PHYSICAL EXAMINATION:  GENERAL:  56 y.o.-year-old patient lying in the bed with no acute distress.  EYES: Pupils equal, round, reactive to light and accommodation. No scleral icterus. Extraocular muscles intact.  HEENT: Head atraumatic, normocephalic. Oropharynx and nasopharynx clear.  NECK:  Supple, no jugular venous distention. No thyroid enlargement, no tenderness.    LUNGS: Normal breath sounds bilaterally, no wheezing, rales,rhonchi or crepitation. No use of accessory muscles of respiration.  CARDIOVASCULAR: S1, S2 normal. No murmurs, rubs, or gallops.  ABDOMEN: Soft, non-tender, non-distended. Bowel sounds present. No organomegaly or mass.  EXTREMITIES: No pedal edema, cyanosis, or clubbing.  NEUROLOGIC: Cranial nerves II through XII are intact. Muscle strength 5/5 in all extremities. Sensation intact. Gait not checked.  PSYCHIATRIC: The patient is alert and oriented x 3.  SKIN: No obvious rash, lesion, or ulcer.  DATA REVIEW:   CBC  Recent Labs Lab 04/16/17 0606  WBC 14.8*  HGB 11.0*  HCT 30.8*  PLT 231    Chemistries   Recent Labs Lab 04/15/17 1211  04/16/17 0606 04/17/17 0405  NA 124*  < > 140 142  K 6.3*  < > 3.6 3.0*  CL 84*  < > 106 109  CO2 <7*  < > 25 26  GLUCOSE 1,085*  < > 145* 108*  BUN 34*  < > 27* 10  CREATININE 1.64*  < > 0.94 0.58  CALCIUM 9.2  < > 8.4* 8.2*  MG  --   --  2.4  --   AST 204*  --   --   --   ALT 84*  --   --   --  ALKPHOS 175*  --   --   --   BILITOT 1.0  --   --   --   < > = values in this interval not displayed.   Microbiology Results  Results for orders placed or performed during the hospital encounter of 04/15/17  Urine culture     Status: None   Collection Time: 04/15/17  1:45 PM  Result Value Ref Range Status   Specimen Description URINE, RANDOM  Final   Special Requests NONE  Final   Culture   Final    NO GROWTH Performed at [facility], [place] [address] [place], [place] [address]    Report Status 04/16/2017 FINAL  Final  Culture, blood (routine x 2)     Status: None (Preliminary result)   Collection Time: 04/15/17  2:17 PM  Result Value Ref Range Status   Specimen Description BLOOD BLOOD LEFT HAND  Final   Special Requests   Final    BOTTLES DRAWN AEROBIC AND ANAEROBIC Blood Culture adequate volume   Culture NO GROWTH 2 DAYS  Final   Report Status PENDING  Incomplete   Culture, blood (routine x 2)     Status: None (Preliminary result)   Collection Time: 04/15/17  2:17 PM  Result Value Ref Range Status   Specimen Description BLOOD A-LINE DRAW  Final   Special Requests   Final    BOTTLES DRAWN AEROBIC AND ANAEROBIC Blood Culture adequate volume   Culture NO GROWTH 2 DAYS  Final   Report Status PENDING  Incomplete  MRSA PCR Screening     Status: None   Collection Time: 04/15/17  4:01 PM  Result Value Ref Range Status   MRSA by PCR NEGATIVE NEGATIVE Final    Comment:        The GeneXpert MRSA Assay (FDA approved for NASAL specimens only), is one component of a comprehensive MRSA colonization surveillance program. It is not intended to diagnose MRSA infection nor to guide or monitor treatment for MRSA infections.   C difficile quick scan w PCR reflex     Status: None   Collection Time: 04/17/17  9:45 AM  Result Value Ref Range Status   C Diff antigen NEGATIVE NEGATIVE Final   C Diff toxin NEGATIVE NEGATIVE Final   C Diff interpretation No C. difficile detected.  Final    RADIOLOGY:  No results found.   Management plans discussed with the patient, family and they are in agreement.  CODE STATUS: Full Code   TOTAL TIME TAKING CARE OF THIS PATIENT: 32 minutes.    Demetrios Loll M.D on 04/17/2017 at 3:19 PM  Between 7am to 6pm - Pager - [phone]  After 6pm go to [email or web address] - Proofreader  [facility]  Hospitalists  Office  [phone]  CC: Primary care physician; [name], NP   Note: This dictation was prepared with Dragon dictation along with smaller phrase technology. Any transcriptional errors that result from this process are unintentional.

## 2017-04-20 LAB — CULTURE, BLOOD (ROUTINE X 2)
CULTURE: NO GROWTH
Culture: NO GROWTH
SPECIAL REQUESTS: ADEQUATE
Special Requests: ADEQUATE

## 2018-01-29 ENCOUNTER — Inpatient Hospital Stay
Admission: EM | Admit: 2018-01-29 | Discharge: 2018-01-31 | DRG: 638 | Disposition: A | Discharge status: Home / Self Care | Payer: Self-pay | Attending: Internal Medicine | Admitting: Internal Medicine

## 2018-01-29 ENCOUNTER — Other Ambulatory Visit: Payer: Self-pay

## 2018-01-29 DIAGNOSIS — E872 Acidosis: Secondary | ICD-10-CM

## 2018-01-29 DIAGNOSIS — K219 Gastro-esophageal reflux disease without esophagitis: Secondary | ICD-10-CM | POA: Diagnosis present

## 2018-01-29 DIAGNOSIS — Z7951 Long term (current) use of inhaled steroids: Secondary | ICD-10-CM

## 2018-01-29 DIAGNOSIS — E1040 Type 1 diabetes mellitus with diabetic neuropathy, unspecified: Secondary | ICD-10-CM | POA: Diagnosis present

## 2018-01-29 DIAGNOSIS — E871 Hypo-osmolality and hyponatremia: Secondary | ICD-10-CM | POA: Diagnosis present

## 2018-01-29 DIAGNOSIS — E1010 Type 1 diabetes mellitus with ketoacidosis without coma: Principal | ICD-10-CM | POA: Diagnosis present

## 2018-01-29 DIAGNOSIS — E875 Hyperkalemia: Secondary | ICD-10-CM | POA: Diagnosis present

## 2018-01-29 DIAGNOSIS — F17200 Nicotine dependence, unspecified, uncomplicated: Secondary | ICD-10-CM | POA: Diagnosis present

## 2018-01-29 DIAGNOSIS — Z794 Long term (current) use of insulin: Secondary | ICD-10-CM

## 2018-01-29 DIAGNOSIS — E860 Dehydration: Secondary | ICD-10-CM | POA: Diagnosis present

## 2018-01-29 DIAGNOSIS — I4891 Unspecified atrial fibrillation: Secondary | ICD-10-CM | POA: Diagnosis present

## 2018-01-29 DIAGNOSIS — R112 Nausea with vomiting, unspecified: Secondary | ICD-10-CM

## 2018-01-29 DIAGNOSIS — N390 Urinary tract infection, site not specified: Secondary | ICD-10-CM | POA: Diagnosis present

## 2018-01-29 DIAGNOSIS — Z8249 Family history of ischemic heart disease and other diseases of the circulatory system: Secondary | ICD-10-CM

## 2018-01-29 DIAGNOSIS — I447 Left bundle-branch block, unspecified: Secondary | ICD-10-CM | POA: Diagnosis present

## 2018-01-29 DIAGNOSIS — E1169 Type 2 diabetes mellitus with other specified complication: Secondary | ICD-10-CM

## 2018-01-29 DIAGNOSIS — E876 Hypokalemia: Secondary | ICD-10-CM | POA: Diagnosis present

## 2018-01-29 DIAGNOSIS — Z833 Family history of diabetes mellitus: Secondary | ICD-10-CM

## 2018-01-29 DIAGNOSIS — I700 Atherosclerosis of aorta: Secondary | ICD-10-CM | POA: Diagnosis present

## 2018-01-29 DIAGNOSIS — N179 Acute kidney failure, unspecified: Secondary | ICD-10-CM | POA: Diagnosis present

## 2018-01-29 DIAGNOSIS — I10 Essential (primary) hypertension: Secondary | ICD-10-CM | POA: Diagnosis present

## 2018-01-29 DIAGNOSIS — I959 Hypotension, unspecified: Secondary | ICD-10-CM | POA: Diagnosis present

## 2018-01-29 DIAGNOSIS — F1290 Cannabis use, unspecified, uncomplicated: Secondary | ICD-10-CM | POA: Diagnosis present

## 2018-01-29 DIAGNOSIS — I480 Paroxysmal atrial fibrillation: Secondary | ICD-10-CM | POA: Diagnosis present

## 2018-01-29 DIAGNOSIS — E1059 Type 1 diabetes mellitus with other circulatory complications: Secondary | ICD-10-CM | POA: Diagnosis present

## 2018-01-29 DIAGNOSIS — F329 Major depressive disorder, single episode, unspecified: Secondary | ICD-10-CM | POA: Diagnosis present

## 2018-01-29 DIAGNOSIS — T25021A Burn of unspecified degree of right foot, initial encounter: Secondary | ICD-10-CM | POA: Diagnosis present

## 2018-01-29 DIAGNOSIS — Z79899 Other long term (current) drug therapy: Secondary | ICD-10-CM

## 2018-01-29 DIAGNOSIS — F17210 Nicotine dependence, cigarettes, uncomplicated: Secondary | ICD-10-CM | POA: Diagnosis present

## 2018-01-29 DIAGNOSIS — F1410 Cocaine abuse, uncomplicated: Secondary | ICD-10-CM | POA: Diagnosis present

## 2018-01-29 DIAGNOSIS — E1110 Type 2 diabetes mellitus with ketoacidosis without coma: Secondary | ICD-10-CM | POA: Diagnosis present

## 2018-01-29 DIAGNOSIS — T25022A Burn of unspecified degree of left foot, initial encounter: Secondary | ICD-10-CM | POA: Diagnosis present

## 2018-01-29 DIAGNOSIS — F1010 Alcohol abuse, uncomplicated: Secondary | ICD-10-CM | POA: Diagnosis present

## 2018-01-29 LAB — GLUCOSE, CAPILLARY
GLUCOSE-CAPILLARY: 165 mg/dL — AB (ref 70–99)
GLUCOSE-CAPILLARY: 265 mg/dL — AB (ref 70–99)
GLUCOSE-CAPILLARY: 91 mg/dL (ref 70–99)
Glucose-Capillary: >600 mg/dL (ref 70–99)
Glucose-Capillary: >600 mg/dL (ref 70–99)
Glucose-Capillary: >600 mg/dL (ref 70–99)
Glucose-Capillary: 591 mg/dL (ref 70–99)
Glucose-Capillary: 488 mg/dL — ABNORMAL HIGH (ref 70–99)
Glucose-Capillary: 471 mg/dL — ABNORMAL HIGH (ref 70–99)
Glucose-Capillary: 372 mg/dL — ABNORMAL HIGH (ref 70–99)
Glucose-Capillary: 340 mg/dL — ABNORMAL HIGH (ref 70–99)
Glucose-Capillary: 202 mg/dL — ABNORMAL HIGH (ref 70–99)
Glucose-Capillary: 194 mg/dL — ABNORMAL HIGH (ref 70–99)
Glucose-Capillary: 137 mg/dL — ABNORMAL HIGH (ref 70–99)
Glucose-Capillary: 111 mg/dL — ABNORMAL HIGH (ref 70–99)
Glucose-Capillary: 85 mg/dL (ref 70–99)
Glucose-Capillary: 76 mg/dL (ref 70–99)

## 2018-01-29 LAB — COMPREHENSIVE METABOLIC PANEL
ALK PHOS: 113 U/L (ref 38–126)
ALT: 36 U/L (ref 0–44)
AST: 37 U/L (ref 15–41)
Albumin: 3.5 g/dL (ref 3.5–5.0)
Anion gap: 28 — ABNORMAL HIGH (ref 5–15)
BUN: 46 mg/dL — ABNORMAL HIGH (ref 6–20)
CALCIUM: 9 mg/dL (ref 8.9–10.3)
CHLORIDE: 87 mmol/L — AB (ref 98–111)
CO2: 8 mmol/L — AB (ref 22–32)
CREATININE: 1.54 mg/dL — AB (ref 0.44–1.00)
GFR calc non Af Amer: 36 mL/min — ABNORMAL LOW (ref >60)
GFR, EST AFRICAN AMERICAN: 42 mL/min — AB (ref >60)
GLUCOSE: 835 mg/dL — AB (ref 70–99)
Potassium: 7.1 mmol/L (ref 3.5–5.1)
SODIUM: 123 mmol/L — AB (ref 135–145)
Total Bilirubin: 1.5 mg/dL — ABNORMAL HIGH (ref 0.3–1.2)
Total Protein: 7.5 g/dL (ref 6.5–8.1)

## 2018-01-29 LAB — URINALYSIS, COMPLETE (UACMP) WITH MICROSCOPIC
BILIRUBIN URINE: NEGATIVE
Glucose, UA: >=500 mg/dL — AB
KETONES UR: 80 mg/dL — AB
Leukocytes, UA: NEGATIVE
NITRITE: POSITIVE — AB
PH: 5 (ref 5.0–8.0)
PROTEIN: NEGATIVE mg/dL
Specific Gravity, Urine: 1.017 (ref 1.005–1.030)

## 2018-01-29 LAB — BASIC METABOLIC PANEL
ANION GAP: 28 — AB (ref 5–15)
Anion gap: 25 — ABNORMAL HIGH (ref 5–15)
Anion gap: 13 (ref 5–15)
Anion gap: 9 (ref 5–15)
BUN: 48 mg/dL — ABNORMAL HIGH (ref 6–20)
BUN: 46 mg/dL — ABNORMAL HIGH (ref 6–20)
BUN: 43 mg/dL — ABNORMAL HIGH (ref 6–20)
BUN: 43 mg/dL — AB (ref 6–20)
CALCIUM: 8.7 mg/dL — AB (ref 8.9–10.3)
CALCIUM: 8.3 mg/dL — AB (ref 8.9–10.3)
CALCIUM: 8.2 mg/dL — AB (ref 8.9–10.3)
CALCIUM: 8 mg/dL — AB (ref 8.9–10.3)
CO2: 8 mmol/L — AB (ref 22–32)
CO2: 10 mmol/L — AB (ref 22–32)
CO2: 20 mmol/L — AB (ref 22–32)
CO2: 21 mmol/L — AB (ref 22–32)
CREATININE: 1.56 mg/dL — AB (ref 0.44–1.00)
CREATININE: 1.56 mg/dL — AB (ref 0.44–1.00)
CREATININE: 1.18 mg/dL — AB (ref 0.44–1.00)
Chloride: 95 mmol/L — ABNORMAL LOW (ref 98–111)
Chloride: 95 mmol/L — ABNORMAL LOW (ref 98–111)
Chloride: 99 mmol/L (ref 98–111)
Chloride: 103 mmol/L (ref 98–111)
Creatinine, Ser: 1.67 mg/dL — ABNORMAL HIGH (ref 0.44–1.00)
GFR calc Af Amer: 58 mL/min — ABNORMAL LOW (ref >60)
GFR calc non Af Amer: 33 mL/min — ABNORMAL LOW (ref >60)
GFR calc non Af Amer: 36 mL/min — ABNORMAL LOW (ref >60)
GFR calc non Af Amer: 36 mL/min — ABNORMAL LOW (ref >60)
GFR calc non Af Amer: 50 mL/min — ABNORMAL LOW (ref >60)
GFR, EST AFRICAN AMERICAN: 38 mL/min — AB (ref >60)
GFR, EST AFRICAN AMERICAN: 41 mL/min — AB (ref >60)
GFR, EST AFRICAN AMERICAN: 41 mL/min — AB (ref >60)
GLUCOSE: 568 mg/dL — AB (ref 70–99)
GLUCOSE: 301 mg/dL — AB (ref 70–99)
GLUCOSE: 152 mg/dL — AB (ref 70–99)
Glucose, Bld: 679 mg/dL (ref 70–99)
POTASSIUM: 5 mmol/L (ref 3.5–5.1)
Potassium: 4 mmol/L (ref 3.5–5.1)
Potassium: 3.4 mmol/L — ABNORMAL LOW (ref 3.5–5.1)
Potassium: 4.2 mmol/L (ref 3.5–5.1)
Sodium: 131 mmol/L — ABNORMAL LOW (ref 135–145)
Sodium: 130 mmol/L — ABNORMAL LOW (ref 135–145)
Sodium: 132 mmol/L — ABNORMAL LOW (ref 135–145)
Sodium: 133 mmol/L — ABNORMAL LOW (ref 135–145)

## 2018-01-29 LAB — HEMOGLOBIN A1C
HEMOGLOBIN A1C: 7.3 % — AB (ref 4.8–5.6)
Mean Plasma Glucose: 162.81 mg/dL

## 2018-01-29 LAB — TSH: TSH: 0.646 u[IU]/mL (ref 0.350–4.500)

## 2018-01-29 LAB — CBC
HEMATOCRIT: 34.8 % — AB (ref 35.0–47.0)
Hemoglobin: 10.8 g/dL — ABNORMAL LOW (ref 12.0–16.0)
MCH: 34.6 pg — AB (ref 26.0–34.0)
MCHC: 31.1 g/dL — AB (ref 32.0–36.0)
MCV: 111.3 fL — ABNORMAL HIGH (ref 80.0–100.0)
Platelets: 319 10*3/uL (ref 150–440)
RBC: 3.13 MIL/uL — ABNORMAL LOW (ref 3.80–5.20)
RDW: 14.8 % — ABNORMAL HIGH (ref 11.5–14.5)
WBC: 14.1 10*3/uL — ABNORMAL HIGH (ref 3.6–11.0)

## 2018-01-29 LAB — MRSA PCR SCREENING: MRSA BY PCR: NEGATIVE

## 2018-01-29 LAB — MAGNESIUM: MAGNESIUM: 1.9 mg/dL (ref 1.7–2.4)

## 2018-01-29 LAB — BLOOD GAS, VENOUS
ACID-BASE DEFICIT: 23.9 mmol/L — AB (ref 0.0–2.0)
Bicarbonate: 5.3 mmol/L — ABNORMAL LOW (ref 20.0–28.0)
O2 Saturation: 79 %
PATIENT TEMPERATURE: 37
pCO2, Ven: 20 mmHg — ABNORMAL LOW (ref 44.0–60.0)
pH, Ven: 7.03 — CL (ref 7.250–7.430)
pO2, Ven: 65 mmHg — ABNORMAL HIGH (ref 32.0–45.0)

## 2018-01-29 LAB — BETA-HYDROXYBUTYRIC ACID: Beta-Hydroxybutyric Acid: >8 mmol/L — ABNORMAL HIGH (ref 0.05–0.27)

## 2018-01-29 LAB — TROPONIN I: Troponin I: <0.03 ng/mL (ref <0.03)

## 2018-01-29 LAB — LACTIC ACID, PLASMA
Lactic Acid, Venous: 3.7 mmol/L (ref 0.5–1.9)
Lactic Acid, Venous: 4.5 mmol/L (ref 0.5–1.9)

## 2018-01-29 LAB — ETHANOL: Alcohol, Ethyl (B): <10 mg/dL (ref <10)

## 2018-01-29 LAB — LIPASE, BLOOD: LIPASE: 24 U/L (ref 11–51)

## 2018-01-29 MED ORDER — OXYCODONE HCL 5 MG PO TABS
5.0000 mg | ORAL_TABLET | ORAL | Status: DC | PRN
  Administered 2018-01-29 – 2018-01-30 (×2): 5 mg via ORAL
  Filled 2018-01-29 (×2): qty 1

## 2018-01-29 MED ORDER — ONDANSETRON HCL 4 MG/2ML IJ SOLN
4.0000 mg | Freq: Four times a day (QID) | INTRAMUSCULAR | Status: DC | PRN
  Administered 2018-01-29 – 2018-01-30 (×3): 4 mg via INTRAVENOUS
  Filled 2018-01-29 (×3): qty 2

## 2018-01-29 MED ORDER — ENOXAPARIN SODIUM 40 MG/0.4ML ~~LOC~~ SOLN
40.0000 mg | SUBCUTANEOUS | Status: DC
  Administered 2018-01-29 – 2018-01-31 (×3): 40 mg via SUBCUTANEOUS
  Filled 2018-01-29 (×3): qty 0.4

## 2018-01-29 MED ORDER — SODIUM CHLORIDE 0.9 % IV SOLN
INTRAVENOUS | Status: DC
  Administered 2018-01-29: 08:00:00 via INTRAVENOUS

## 2018-01-29 MED ORDER — DILTIAZEM HCL-DEXTROSE 100-5 MG/100ML-% IV SOLN (PREMIX)
5.0000 mg/h | INTRAVENOUS | Status: DC
  Filled 2018-01-29: qty 100

## 2018-01-29 MED ORDER — FOLIC ACID 5 MG/ML IJ SOLN
1.0000 mg | Freq: Every day | INTRAMUSCULAR | Status: DC
  Filled 2018-01-29: qty 0.2

## 2018-01-29 MED ORDER — POTASSIUM CHLORIDE CRYS ER 20 MEQ PO TBCR
40.0000 meq | EXTENDED_RELEASE_TABLET | Freq: Once | ORAL | Status: AC
  Administered 2018-01-29: 40 meq via ORAL
  Filled 2018-01-29: qty 2

## 2018-01-29 MED ORDER — LORAZEPAM 2 MG/ML IJ SOLN: 0.0000 mg | Freq: Two times a day (BID) | INTRAMUSCULAR | Status: DC

## 2018-01-29 MED ORDER — SODIUM CHLORIDE 0.9 % IV SOLN
INTRAVENOUS | Status: DC
  Administered 2018-01-29: 18:00:00 via INTRAVENOUS

## 2018-01-29 MED ORDER — SODIUM CHLORIDE 0.9 % IV BOLUS
1000.0000 mL | Freq: Once | INTRAVENOUS | Status: AC
  Administered 2018-01-29: 1000 mL via INTRAVENOUS

## 2018-01-29 MED ORDER — ACETAMINOPHEN 650 MG RE SUPP: 650.0000 mg | Freq: Four times a day (QID) | RECTAL | Status: DC | PRN

## 2018-01-29 MED ORDER — THIAMINE HCL 100 MG/ML IJ SOLN: 100.0000 mg | Freq: Every day | INTRAMUSCULAR | Status: DC

## 2018-01-29 MED ORDER — SODIUM CHLORIDE 0.9 % IV SOLN
INTRAVENOUS | Status: DC
  Administered 2018-01-29: 15.6 [IU]/h via INTRAVENOUS
  Administered 2018-01-29: 5.4 [IU]/h via INTRAVENOUS
  Filled 2018-01-29 (×3): qty 1

## 2018-01-29 MED ORDER — SODIUM CHLORIDE 0.9 % IV BOLUS
500.0000 mL | Freq: Once | INTRAVENOUS | Status: AC
  Administered 2018-01-29: 500 mL via INTRAVENOUS

## 2018-01-29 MED ORDER — INSULIN ASPART 100 UNIT/ML ~~LOC~~ SOLN
0.0000 [IU] | SUBCUTANEOUS | Status: DC
  Administered 2018-01-30 (×2): 7 [IU] via SUBCUTANEOUS
  Filled 2018-01-29 (×2): qty 1

## 2018-01-29 MED ORDER — SODIUM CHLORIDE 0.9 % IV SOLN
INTRAVENOUS | Status: DC
  Administered 2018-01-29: 12:00:00 via INTRAVENOUS

## 2018-01-29 MED ORDER — ADULT MULTIVITAMIN W/MINERALS CH: 1.0000 | ORAL_TABLET | Freq: Every day | ORAL | Status: DC

## 2018-01-29 MED ORDER — PANTOPRAZOLE SODIUM 40 MG PO TBEC
40.0000 mg | DELAYED_RELEASE_TABLET | Freq: Every day | ORAL | Status: DC
  Administered 2018-01-29 – 2018-01-31 (×3): 40 mg via ORAL
  Filled 2018-01-29 (×3): qty 1

## 2018-01-29 MED ORDER — SODIUM CHLORIDE 0.9 % IV SOLN
1.0000 g | Freq: Once | INTRAVENOUS | Status: AC
  Administered 2018-01-29: 1 g via INTRAVENOUS
  Filled 2018-01-29: qty 10

## 2018-01-29 MED ORDER — METOPROLOL TARTRATE 5 MG/5ML IV SOLN
2.5000 mg | INTRAVENOUS | Status: DC | PRN
  Administered 2018-01-29: 2.5 mg via INTRAVENOUS
  Filled 2018-01-29: qty 5

## 2018-01-29 MED ORDER — DEXTROSE-NACL 5-0.45 % IV SOLN
INTRAVENOUS | Status: DC
  Administered 2018-01-29: 17:00:00 via INTRAVENOUS

## 2018-01-29 MED ORDER — SODIUM CHLORIDE 0.9 % IV SOLN: 1.0000 g | Freq: Once | INTRAVENOUS | Status: DC

## 2018-01-29 MED ORDER — SODIUM CHLORIDE 0.9 % IV SOLN
1.0000 g | INTRAVENOUS | Status: DC
  Administered 2018-01-30 – 2018-01-31 (×2): 1 g via INTRAVENOUS
  Filled 2018-01-29 (×2): qty 1

## 2018-01-29 MED ORDER — FAMOTIDINE IN NACL 20-0.9 MG/50ML-% IV SOLN
20.0000 mg | Freq: Once | INTRAVENOUS | Status: AC
  Administered 2018-01-29: 20 mg via INTRAVENOUS
  Filled 2018-01-29: qty 50

## 2018-01-29 MED ORDER — LORAZEPAM 2 MG/ML IJ SOLN: 2.0000 mg | INTRAMUSCULAR | Status: DC | PRN

## 2018-01-29 MED ORDER — LORAZEPAM 2 MG/ML IJ SOLN: 0.0000 mg | Freq: Four times a day (QID) | INTRAMUSCULAR | Status: DC

## 2018-01-29 MED ORDER — SODIUM POLYSTYRENE SULFONATE 15 GM/60ML PO SUSP
30.0000 g | Freq: Once | ORAL | Status: AC
  Administered 2018-01-29: 30 g via ORAL
  Filled 2018-01-29: qty 120

## 2018-01-29 MED ORDER — DILTIAZEM LOAD VIA INFUSION
10.0000 mg | Freq: Once | INTRAVENOUS | Status: DC | PRN
  Filled 2018-01-29: qty 10

## 2018-01-29 MED ORDER — SILVER SULFADIAZINE 1 % EX CREA
TOPICAL_CREAM | Freq: Two times a day (BID) | CUTANEOUS | Status: DC
Start: 2018-01-29 — End: 2018-01-31
  Administered 2018-01-29: 1 via TOPICAL
  Administered 2018-01-30 (×2): via TOPICAL
  Administered 2018-01-31: 1 via TOPICAL
  Filled 2018-01-29: qty 85

## 2018-01-29 MED ORDER — ONDANSETRON HCL 4 MG/2ML IJ SOLN: 4.0000 mg | Freq: Once | INTRAMUSCULAR | Status: DC

## 2018-01-29 MED ORDER — LORAZEPAM 2 MG/ML IJ SOLN: 1.0000 mg | Freq: Four times a day (QID) | INTRAMUSCULAR | Status: DC | PRN

## 2018-01-29 MED ORDER — ACETAMINOPHEN 325 MG PO TABS
650.0000 mg | ORAL_TABLET | Freq: Four times a day (QID) | ORAL | Status: DC | PRN
  Administered 2018-01-30: 650 mg via ORAL
  Filled 2018-01-29: qty 2

## 2018-01-29 MED ORDER — SODIUM BICARBONATE 8.4 % IV SOLN
50.0000 meq | Freq: Once | INTRAVENOUS | Status: AC
  Administered 2018-01-29: 50 meq via INTRAVENOUS
  Filled 2018-01-29: qty 50

## 2018-01-29 MED ORDER — SODIUM ZIRCONIUM CYCLOSILICATE 5 G PO PACK: 5.0000 g | PACK | Freq: Once | ORAL | Status: DC

## 2018-01-29 MED ORDER — LORAZEPAM 1 MG PO TABS: 1.0000 mg | ORAL_TABLET | Freq: Four times a day (QID) | ORAL | Status: DC | PRN

## 2018-01-29 MED ORDER — INSULIN GLARGINE 100 UNIT/ML ~~LOC~~ SOLN
20.0000 [IU] | Freq: Every day | SUBCUTANEOUS | Status: DC
  Administered 2018-01-29: 20 [IU] via SUBCUTANEOUS
  Filled 2018-01-29 (×2): qty 0.2

## 2018-01-29 MED ORDER — DILTIAZEM LOAD VIA INFUSION
10.0000 mg | Freq: Once | INTRAVENOUS | Status: DC
  Filled 2018-01-29: qty 10

## 2018-01-29 MED ORDER — FLUOXETINE HCL 20 MG PO CAPS
40.0000 mg | ORAL_CAPSULE | Freq: Every day | ORAL | Status: DC
  Administered 2018-01-30 – 2018-01-31 (×2): 40 mg via ORAL
  Filled 2018-01-29 (×3): qty 2

## 2018-01-29 MED ORDER — CEFTRIAXONE SODIUM 1 G IJ SOLR
1.0000 g | Freq: Once | INTRAMUSCULAR | Status: AC
  Administered 2018-01-29: 1 g via INTRAVENOUS
  Filled 2018-01-29: qty 1
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
## 2018-01-29 NOTE — Progress Notes (Signed)
Inpatient Diabetes Program Recommendations  AACE/ADA: New Consensus Statement on Inpatient Glycemic Control (2019)  Target Ranges:  Prepandial:   less than 140 mg/dL      Peak postprandial:   less than 180 mg/dL (1-2 hours)      Critically ill patients:  140 - 180 mg/dL   Results for Michaela Johnson, Michaela Johnson (MRN [id number]) as of 01/29/2018 14:17  Ref. Range 01/29/2018 05:31 01/29/2018 07:42 01/29/2018 09:05 01/29/2018 10:07 01/29/2018 11:04 01/29/2018 12:06 01/29/2018 13:02  Glucose-Capillary Latest Ref Range: 70 - 99 mg/dL >600 (HH) >600 (HH) >600 (HH) >600 (HH) 591 (HH) 488 (H) 471 (H)  Results for Michaela Johnson, Michaela Johnson (MRN [id number]) as of 01/29/2018 14:17  Ref. Range 01/29/2018 06:17  Hemoglobin A1C Latest Ref Range: 4.8 - 5.6 % 7.3 (H)   Review of Glycemic Control  Outpatient Diabetes medications: Lantus 50 units QHS Current orders for Inpatient glycemic control: IV insulin drip  NOTE: Patient is currently still on IV insulin drip and acidosis is not cleared yet according to labs. Spoke with patient about diabetes and home regimen for diabetes control. Patient reports that she is followed by [facility] for diabetes management and she gets insulin and testing supplies at the clinic pharmacy. Patient reports that she has everything at home she needs for DM management.  Patient reports that she is only taking Lantus 50 units QHS as an outpatient for diabetes control. Patient reports that when she went to the clinic about 3 weeks ago she was told she could stop taking the Novolog insulin since her glucose was doing so well. Patient does not recall A1C done 3 weeks ago but was told that it was "good". Patient reports that she checks glucose at home and it had been trending well until the last few days. Patient states that she was still taking insulin at home despite N/V.  Reviewed sick day rules and explained importance of still taking insulin even when sick but encouraged patient to talk with MD about any  adjustments that she needed to make when not able to eat due to nausea.  Discussed glucose and A1C goals. Discussed importance of checking CBGs several times a day and maintaining good CBG control to prevent long-term and short-term complications. Explained how hyperglycemia leads to damage within blood vessels which lead to the common complications seen with uncontrolled diabetes. Explained that if glucose is consistently staying over 180 mg/dl at home, she needs to talk with the doctor at the clinic about whether she needs to resume Novolog with meals. Discussed impact of nutrition, exercise, stress, sickness, and medications on diabetes control. Encouraged patient to check her glucose 3-4 times per day (before meals and at bedtime) and to keep a log book of glucose readings and insulin taken which she will need to take to doctor appointments. Patient verbalized understanding of information discussed and he states that he has no further questions at this time related to diabetes. MD, may want to consider having patient resume Novolog with meals as an outpatient.  Thanks, Barnie Alderman, RN, MSN, CDE Diabetes Coordinator Inpatient Diabetes Program [phone] (Team Pager)

## 2018-01-29 NOTE — ED Notes (Signed)
Cassie, RN attempting to draw blood cultures at this time.

## 2018-01-29 NOTE — ED Triage Notes (Signed)
Patient coming [facility] for N/V X 2 days. Patient has hx hypertension and diabetes. Patient's CBG for EMS 442. Patients HR 115, BP 142/58  Patient now also reporting upper abdominal pain.

## 2018-01-29 NOTE — ED Notes (Addendum)
Cassie, RN attempted to obtain blood cultures x2.   Lab called by this RN to request tech for draw when pt gets to ICU.

## 2018-01-29 NOTE — ED Provider Notes (Signed)
[facility] Emergency Department Provider Note   ____________________________________________   First MD Initiated Contact with Patient 01/29/18 0535     (approximate)  I have reviewed the triage vital signs and the nursing notes.   HISTORY  Chief Complaint Emesis    HPI Michaela Johnson is a 57 y.o. female brought to the ED from home via EMS with a chief complaint of hyperglycemia, nausea/vomiting, epigastric pain.  Patient has a history of type 1 diabetes with prior hospitalizations for DKA.  Reports a 1 day history of nausea/vomiting associated with abdominal pain.  Denies associated fever, chills, chest pain, shortness of breath, dysuria, diarrhea.  Denies recent travel or trauma.  She was given 8 mg Zofran per EMS during transport.   Past Medical History:  Diagnosis Date  . Alcohol use   . Depression   . Diabetes mellitus without complication ([place])   . GERD (gastroesophageal reflux disease)   . Hypertension   . Neuropathy   . Tobacco use     Patient Active Problem List   Diagnosis Date Noted  . Seizure ([place])   . DKA (diabetic ketoacidoses) ([place]) 06/07/2016    History reviewed. No pertinent surgical history.  Prior to Admission medications   Medication Sig Start Date End Date Taking? Authorizing Provider  citalopram (CELEXA) 20 MG tablet Take 1 tablet by mouth daily. 05/13/16 05/13/17  [provider]  diltiazem (CARDIZEM CD) 120 MG 24 hr capsule Take 1 capsule (120 mg total) by mouth daily. Patient not taking: Reported on 04/15/2017 06/11/16   Dustin Flock, MD  enalapril (VASOTEC) 20 MG tablet Take 0.5 tablets (10 mg total) by mouth daily. 06/10/16   Dustin Flock, MD  fluticasone (FLONASE) 50 MCG/ACT nasal spray Place 2 sprays into both nostrils daily.    [provider]  insulin aspart (NOVOLOG) 100 UNIT/ML FlexPen Inject 5 Units into the skin 3 (three) times daily with meals.    [provider]  Insulin  Detemir (LEVEMIR FLEXPEN [place]) Inject 40 Units into the skin at bedtime.     [provider]  Multiple Vitamin (MULTI-VITAMINS) TABS Take 1 tablet by mouth daily.    [provider]  Omega-3 Fatty Acids (FISH OIL PO) Take 1 capsule by mouth daily.    [provider]  omeprazole (PRILOSEC) 20 MG capsule Take 1 capsule by mouth daily.    [provider]    Allergies Patient has no known allergies.  Family History  Family history unknown: Yes    Social History Social History   Tobacco Use  . Smoking status: Current Some Day Smoker  . Smokeless tobacco: Never Used  Substance Use Topics  . Alcohol use: Yes  . Drug use: Not on file    Review of Systems  Constitutional: No fever/chills Eyes: No visual changes. ENT: No sore throat. Cardiovascular: Denies chest pain. Respiratory: Denies shortness of breath. Gastrointestinal: Positive for epigastric abdominal pain, nausea and vomiting.  No diarrhea.  No constipation. Genitourinary: Negative for dysuria. Musculoskeletal: Negative for back pain. Skin: Negative for rash. Neurological: Negative for headaches, focal weakness or numbness.   ____________________________________________   PHYSICAL EXAM:  VITAL SIGNS: ED Triage Vitals  Enc Vitals Group     BP 01/29/18 0532 127/62     Pulse Rate 01/29/18 0532 (!) 115     Resp 01/29/18 0532 20     Temp 01/29/18 0532 97.6 F (36.4 C)     Temp Source 01/29/18 0532 Oral  SpO2 01/29/18 0532 100 %     Weight 01/29/18 0529 134 lb (60.8 kg)     Height 01/29/18 0529 5\' 2"  (1.575 m)     Head Circumference --      Peak Flow --      Pain Score 01/29/18 0529 6     Pain Loc --      Pain Edu? --      Excl. in [place]? --     Constitutional: Alert and oriented.  Ill appearing and in moderate acute distress. Eyes: Conjunctivae are normal. PERRL. EOMI. Head: Atraumatic. Nose: No congestion/rhinnorhea. Mouth/Throat: Mucous membranes are moist.  Oropharynx  non-erythematous. Neck: No stridor.   Cardiovascular: Tachycardic rate, regular rhythm. Grossly normal heart sounds.  Good peripheral circulation. Respiratory: Normal respiratory effort.  No retractions. Lungs CTAB. Gastrointestinal: Soft and mildly tender to palpation epigastrium without rebound or guarding. No distention. No abdominal bruits. No CVA tenderness. Musculoskeletal: No lower extremity tenderness nor edema.  No joint effusions. Neurologic:  Normal speech and language. No gross focal neurologic deficits are appreciated. No gait instability. Skin:  Skin is warm, dry and intact. No rash noted. Psychiatric: Mood and affect are normal. Speech and behavior are normal.  ____________________________________________   LABS (all labs ordered are listed, but only abnormal results are displayed)  Labs Reviewed  COMPREHENSIVE METABOLIC PANEL - Abnormal; Notable for the following components:      Result Value   Sodium 123 (*)    Potassium 7.1 (*)    Chloride 87 (*)    CO2 8 (*)    Glucose, Bld 835 (*)    BUN 46 (*)    Creatinine, Ser 1.54 (*)    Total Bilirubin 1.5 (*)    GFR calc non Af Amer 36 (*)    GFR calc Af Amer 42 (*)    Anion gap 28 (*)    All other components within normal limits  CBC - Abnormal; Notable for the following components:   WBC 14.1 (*)    RBC 3.13 (*)    Hemoglobin 10.8 (*)    HCT 34.8 (*)    MCV 111.3 (*)    MCH 34.6 (*)    MCHC 31.1 (*)    RDW 14.8 (*)    All other components within normal limits  URINALYSIS, COMPLETE (UACMP) WITH MICROSCOPIC - Abnormal; Notable for the following components:   Color, Urine YELLOW (*)    APPearance HAZY (*)    Glucose, UA >=500 (*)    Hgb urine dipstick SMALL (*)    Ketones, ur 80 (*)    Nitrite POSITIVE (*)    Bacteria, UA MANY (*)    All other components within normal limits  GLUCOSE, CAPILLARY - Abnormal; Notable for the following components:   Glucose-Capillary >600 (*)    All other components within  normal limits  BLOOD GAS, VENOUS - Abnormal; Notable for the following components:   pH, Ven 7.03 (*)    pCO2, Ven 20 (*)    pO2, Ven 65.0 (*)    Bicarbonate 5.3 (*)    Acid-base deficit 23.9 (*)    All other components within normal limits  CULTURE, BLOOD (ROUTINE X 2)  CULTURE, BLOOD (ROUTINE X 2)  URINE CULTURE  LIPASE, BLOOD  TROPONIN I  ETHANOL  BETA-HYDROXYBUTYRIC ACID  LACTIC ACID, PLASMA  LACTIC ACID, PLASMA  BASIC METABOLIC PANEL   ____________________________________________  EKG  ED ECG REPORT I, SUNG,JADE J, the attending physician, personally viewed and interpreted this  ECG.   Date: 01/29/2018  EKG Time: 0529  Rate: 115  Rhythm: sinus tachycardia  Axis: LAD  Intervals:left bundle branch block  ST&T Change: Nonspecific No significant change from 05/2016 ____________________________________________  RADIOLOGY  ED MD interpretation: None  Official radiology report(s): No results found.  ____________________________________________   PROCEDURES  Procedure(s) performed: None  Procedures  Critical Care performed: Yes, see critical care note(s)   CRITICAL CARE Performed by: [name]   Total critical care time: 60 minutes  Critical care time was exclusive of separately billable procedures and treating other patients.  Critical care was necessary to treat or prevent imminent or life-threatening deterioration.  Critical care was time spent personally by me on the following activities: development of treatment plan with patient and/or surrogate as well as nursing, discussions with consultants, evaluation of patient's response to treatment, examination of patient, obtaining history from patient or surrogate, ordering and performing treatments and interventions, ordering and review of laboratory studies, ordering and review of radiographic studies, pulse oximetry and re-evaluation of patient's  condition.  ____________________________________________   INITIAL IMPRESSION / ASSESSMENT AND PLAN / ED COURSE  As part of my medical decision making, I reviewed the following data within the [place] notes reviewed and incorporated, Labs reviewed, EKG interpreted, Old chart reviewed, Radiograph reviewed, Discussed with admitting physician and Notes from prior ED visits   57 year old female with type 1 diabetes with a history of DKA who presents with epigastric pain, nausea and vomiting.  Differential diagnosis includes but is not limited to hyperglycemia, DKA, gastroenteritis, UTI, etc.  Will initiate IV fluid resuscitation with 2 L IV normal saline.  20 mg IV Pepcid was given with relief of epigastric discomfort.  Nausea improved after 8 mg Zofran given by EMS.  Awaiting lab work and urinalysis.  Anticipate hospitalization.  Clinical Course as of Jan 29 729  Fri Jan 29, 2018  0715 I have personally reviewed patient's laboratory and urinalysis results.  Most notable abnormalities include hyperglycemia, hyperkalemia, pseudohyponatremia, nitrate positive UTI.  Hyperkalemia treatment started, IV insulin drip as well as 1 g IV Rocephin ordered.  Discussed with hospitalist service to evaluate patient in the emergency department for admission to the ICU.   [JS]    Clinical Course User Index [JS] [name], MD     ____________________________________________   FINAL CLINICAL IMPRESSION(S) / ED DIAGNOSES  Final diagnoses:  Diabetic ketoacidosis without coma associated with type 1 diabetes mellitus (HCC)  Hyperkalemia  Metabolic acidosis due to diabetes mellitus (HCC)  Non-intractable vomiting with nausea, unspecified vomiting type     ED Discharge Orders    None       Note:  This document was prepared using Dragon voice recognition software and may include unintentional dictation errors.    [name], MD 01/29/18 [phone]

## 2018-01-29 NOTE — ED Notes (Signed)
Per lab, they will add-on urine culture to urine already sent

## 2018-01-29 NOTE — Progress Notes (Signed)
Pt.'s last 2 sets of lab work showed closed anion gap and CO2 >20 Current CBG: 137 for 2000, insulin gtt at 4.9  Alerted Ms. Patria Mane, NP to changes.  NP stated she will look over information and place transition orders.  Will continue to monitor pt. Closely.

## 2018-01-29 NOTE — ED Notes (Signed)
This RN made 2 unsuccessful attempts at PIV insertion. EMT student made 1 unsuccessful attempt at PIV insertion. Butch RN to bedside to attempt IV insertion

## 2018-01-29 NOTE — ED Notes (Signed)
This RN attempted to obtain blood cultures x2. Unsuccessful.

## 2018-01-29 NOTE — ED Notes (Signed)
Hiral, RN updated with new rate of insulin drip. Also aware pt is en route to ICU at this time. Adrian Blackwater, RN to transport pt to [place].

## 2018-01-29 NOTE — ED Triage Notes (Signed)
Patient given 8 mg Zofran during EMS transport

## 2018-01-29 NOTE — H&P (Signed)
[place] [facility] [place] NAME: Michaela Johnson    MR#:  [id number]  DATE OF BIRTH:  [birth year]  DATE OF ADMISSION:  01/29/2018  PRIMARY CARE PHYSICIAN: [name], NP   REQUESTING/REFERRING PHYSICIAN: Dr. Lurline Hare  CHIEF COMPLAINT:   Chief Complaint  Patient presents with  . Emesis    HISTORY OF PRESENT ILLNESS:  Michaela Johnson  is a 57 y.o. female with a known history of diabetes presents with nausea vomiting and unable to keep anything down for the past 2 days.  She has been having pain all over.  She has abdominal pain from the vomiting.  Not to severe in nature.  She has a sore throat from all the vomiting.  On July 4 she was walking on hot pavement and has blistering on the skin.  Her entire body is hurting.  In the ER, she was found to be in diabetic ketoacidosis with hyperkalemia and hyponatremia.  Hospitalist services were contacted for further evaluation.  Of note she drinks 6 beers a day and her last drink was 3 days ago.  PAST MEDICAL HISTORY:   Past Medical History:  Diagnosis Date  . Alcohol use   . Depression   . Diabetes mellitus without complication ([place])   . GERD (gastroesophageal reflux disease)   . Hypertension   . Neuropathy   . Tobacco use     PAST SURGICAL HISTORY:   Past Surgical History:  Procedure Laterality Date  . NO PAST SURGERIES      SOCIAL HISTORY:   Social History   Tobacco Use  . Smoking status: Current Some Day Smoker    Packs/day: 1.00    Years: 41.00    Pack years: 41.00  . Smokeless tobacco: Never Used  Substance Use Topics  . Alcohol use: Yes    Comment: 42 per week    FAMILY HISTORY:   Family History  Problem Relation Age of Onset  . Diabetes Mother   . CAD Father     DRUG ALLERGIES:  No Known Allergies  REVIEW OF SYSTEMS:  CONSTITUTIONAL: No fever, chills or sweats.  Positive for fatigue.  EYES: No blurred or double vision.  EARS, NOSE, AND THROAT: No  tinnitus or ear pain. No sore throat RESPIRATORY: No cough.  Some shortness of breath, no wheezing or hemoptysis.  CARDIOVASCULAR: No chest pain, orthopnea, edema.  GASTROINTESTINAL: Positive for nausea, vomiting, and abdominal pain. No blood in bowel movements GENITOURINARY: No dysuria, hematuria.  ENDOCRINE: No polyuria, nocturia,  HEMATOLOGY: No anemia, easy bruising or bleeding SKIN: No rash or lesion. MUSCULOSKELETAL: Positive for pain all over NEUROLOGIC: No tingling, numbness, weakness.  PSYCHIATRY: Positive for depression.   MEDICATIONS AT HOME:   Prior to Admission medications   Medication Sig Start Date End Date Taking? Authorizing Provider  citalopram (CELEXA) 20 MG tablet Take 1 tablet by mouth daily. 05/13/16 05/13/17  [provider]  omeprazole (PRILOSEC) 20 MG capsule Take 1 capsule by mouth daily.    [provider]   Med rec still undergoing.  Patient states she takes Lantus 50 units daily, lisinopril 40 mg daily, omeprazole 20 mg daily and fluoxetine 40 mg daily  VITAL SIGNS:  Blood pressure (!) 117/52, pulse (!) 111, temperature 97.6 F (36.4 C), temperature source Oral, resp. rate (!) 24, height 5\' 2"  (1.575 m), weight 60.8 kg (134 lb), SpO2 100 %.  PHYSICAL EXAMINATION:  GENERAL:  57 y.o.-year-old patient lying in the bed with no  acute distress.  EYES: Pupils equal, round, reactive to light and accommodation. No scleral icterus. Extraocular muscles intact.  HEENT: Head atraumatic, normocephalic. Oropharynx and nasopharynx clear.  NECK:  Supple, no jugular venous distention. No thyroid enlargement, no tenderness.  LUNGS: Normal breath sounds bilaterally, no wheezing, rales,rhonchi or crepitation. No use of accessory muscles of respiration.  CARDIOVASCULAR: S1, S2 tachycardic. No murmurs, rubs, or gallops.  ABDOMEN: Soft, nontender, distended. Bowel sounds present. No organomegaly or mass.  EXTREMITIES: No pedal edema, cyanosis, or clubbing.   NEUROLOGIC: Cranial nerves II through XII are intact. Muscle strength 5/5 in all extremities. Sensation intact. Gait not checked.  PSYCHIATRIC: The patient is alert and oriented x 3.  SKIN: Bilateral bottom of the feet blister that popped on the right foot and another spot that has some hardened skin on the right foot.  Another spot on the left foot that has some hardening of the skin likely from the hot pavement that she walked on.  LABORATORY PANEL:   CBC Recent Labs  Lab 01/29/18 0616  WBC 14.1*  HGB 10.8*  HCT 34.8*  PLT 319   ------------------------------------------------------------------------------------------------------------------  Chemistries  Recent Labs  Lab 01/29/18 0616  NA 123*  K 7.1*  CL 87*  CO2 8*  GLUCOSE 835*  BUN 46*  CREATININE 1.54*  CALCIUM 9.0  AST 37  ALT 36  ALKPHOS 113  BILITOT 1.5*   ------------------------------------------------------------------------------------------------------------------  Cardiac Enzymes Recent Labs  Lab 01/29/18 0616  TROPONINI <0.03   ------------------------------------------------------------------------------------------------------------------   EKG:   Sinus tachycardia with left bundle branch block.  Left bundle branch block present on prior EKGs back in 2017  IMPRESSION AND PLAN:   1.  Diabetic ketoacidosis.  Hyperglycemia protocol ordered with insulin drip by ER physician.  Check fingersticks q. one hour.  Add on hemoglobin A1c.  IV fluid hydration. 2.  Hyperkalemia.  This should going down with starting IV fluids and insulin drip.  ER physician already gave Kayexalate and bicarb and calcium. 3.  Hyponatremia secondary to diabetic ketoacidosis this should improve with IV fluid hydration. 4.  Acute kidney injury.  Hold ACE inhibitor.  IV fluid hydration 5.  Alcohol abuse put on alcohol withdrawal protocol.  Patient advised to cut back on her drinking alcohol.  Drinks 6 beers a day. 6.   Tobacco abuse.  Smoking cessation counseling done 4 minutes by me.  Nicotine patch refused. 7.  Marijuana use on a daily basis.  I mentioned that this could cause cannabis hyperemesis syndrome.  Advised to stop smoking marijuana. 8.  Burns on bilateral feet secondary to walking on hot pavement.  Will have wound care nurse evaluate.  Nonstick dressing daily. 9.  ER physician started Rocephin for possibility of UTI.  Follow-up cultures. 10.  GERD on PPI 11.  Essential hypertension hold lisinopril at this time 12.  Depression on fluoxetine 40 mg daily    All the records are reviewed and case discussed with ED provider. Management plans discussed with the patient, family and they are in agreement.  CODE STATUS: full code  TOTAL TIME TAKING CARE OF THIS PATIENT: 50 minutes.    Loletha Grayer M.D on 01/29/2018 at 7:45 AM  Between 7am to 6pm - Pager - [phone]  After 6pm call admission pager [phone]  [facility]  [phone]  CC: Primary care physician; [name], NP

## 2018-01-30 ENCOUNTER — Inpatient Hospital Stay
Admit: 2018-01-30 | Discharge: 2018-01-30 | Disposition: A | Discharge status: Home / Self Care | Payer: Self-pay | Attending: Adult Health | Admitting: Adult Health

## 2018-01-30 DIAGNOSIS — I4891 Unspecified atrial fibrillation: Secondary | ICD-10-CM | POA: Diagnosis present

## 2018-01-30 DIAGNOSIS — I480 Paroxysmal atrial fibrillation: Secondary | ICD-10-CM | POA: Diagnosis present

## 2018-01-30 DIAGNOSIS — E1310 Other specified diabetes mellitus with ketoacidosis without coma: Secondary | ICD-10-CM

## 2018-01-30 LAB — GLUCOSE, CAPILLARY
GLUCOSE-CAPILLARY: 209 mg/dL — AB (ref 70–99)
GLUCOSE-CAPILLARY: 233 mg/dL — AB (ref 70–99)
GLUCOSE-CAPILLARY: 177 mg/dL — AB (ref 70–99)
GLUCOSE-CAPILLARY: 216 mg/dL — AB (ref 70–99)
GLUCOSE-CAPILLARY: 275 mg/dL — AB (ref 70–99)
Glucose-Capillary: 239 mg/dL — ABNORMAL HIGH (ref 70–99)
Glucose-Capillary: 201 mg/dL — ABNORMAL HIGH (ref 70–99)

## 2018-01-30 LAB — CBC
HEMATOCRIT: 33 % — AB (ref 35.0–47.0)
HEMOGLOBIN: 11.3 g/dL — AB (ref 12.0–16.0)
MCH: 34.5 pg — ABNORMAL HIGH (ref 26.0–34.0)
MCHC: 34.4 g/dL (ref 32.0–36.0)
MCV: 100.5 fL — ABNORMAL HIGH (ref 80.0–100.0)
Platelets: 277 10*3/uL (ref 150–440)
RBC: 3.28 MIL/uL — ABNORMAL LOW (ref 3.80–5.20)
RDW: 13.6 % (ref 11.5–14.5)
WBC: 13.9 10*3/uL — ABNORMAL HIGH (ref 3.6–11.0)

## 2018-01-30 LAB — PHOSPHORUS: PHOSPHORUS: 2.1 mg/dL — AB (ref 2.5–4.6)

## 2018-01-30 LAB — BASIC METABOLIC PANEL
ANION GAP: 15 (ref 5–15)
BUN: 39 mg/dL — ABNORMAL HIGH (ref 6–20)
CO2: 16 mmol/L — ABNORMAL LOW (ref 22–32)
Calcium: 7.9 mg/dL — ABNORMAL LOW (ref 8.9–10.3)
Chloride: 104 mmol/L (ref 98–111)
Creatinine, Ser: 1.19 mg/dL — ABNORMAL HIGH (ref 0.44–1.00)
GFR, EST AFRICAN AMERICAN: 57 mL/min — AB (ref >60)
GFR, EST NON AFRICAN AMERICAN: 49 mL/min — AB (ref >60)
GLUCOSE: 253 mg/dL — AB (ref 70–99)
POTASSIUM: 4.6 mmol/L (ref 3.5–5.1)
SODIUM: 135 mmol/L (ref 135–145)

## 2018-01-30 LAB — LIPID PANEL
CHOL/HDL RATIO: 1.9 ratio
CHOLESTEROL: 136 mg/dL (ref 0–200)
HDL: 73 mg/dL (ref >40)
LDL CALC: 42 mg/dL (ref 0–99)
TRIGLYCERIDES: 106 mg/dL (ref <150)
VLDL: 21 mg/dL (ref 0–40)

## 2018-01-30 LAB — MAGNESIUM: MAGNESIUM: 2.3 mg/dL (ref 1.7–2.4)

## 2018-01-30 MED ORDER — FOLIC ACID 1 MG PO TABS
1.0000 mg | ORAL_TABLET | Freq: Every day | ORAL | Status: DC
  Administered 2018-01-30 – 2018-01-31 (×2): 1 mg via ORAL
  Filled 2018-01-30 (×2): qty 1

## 2018-01-30 MED ORDER — DILTIAZEM HCL 90 MG PO TABS
90.0000 mg | ORAL_TABLET | Freq: Two times a day (BID) | ORAL | Status: DC
  Administered 2018-01-30 – 2018-01-31 (×2): 90 mg via ORAL
  Filled 2018-01-30: qty 3
  Filled 2018-01-30 (×3): qty 1
  Filled 2018-01-30: qty 3

## 2018-01-30 MED ORDER — LORAZEPAM 1 MG PO TABS: 1.0000 mg | ORAL_TABLET | Freq: Four times a day (QID) | ORAL | Status: DC | PRN

## 2018-01-30 MED ORDER — VITAMIN B-1 100 MG PO TABS
100.0000 mg | ORAL_TABLET | Freq: Every day | ORAL | Status: DC
  Administered 2018-01-30 – 2018-01-31 (×2): 100 mg via ORAL
  Filled 2018-01-30 (×2): qty 1

## 2018-01-30 MED ORDER — SODIUM CHLORIDE 0.9 % IV SOLN
INTRAVENOUS | Status: DC
  Administered 2018-01-30: 04:00:00 via INTRAVENOUS

## 2018-01-30 MED ORDER — LORAZEPAM 2 MG/ML IJ SOLN: 1.0000 mg | Freq: Four times a day (QID) | INTRAMUSCULAR | Status: DC | PRN

## 2018-01-30 MED ORDER — SODIUM CHLORIDE 0.9 % IV BOLUS
500.0000 mL | Freq: Once | INTRAVENOUS | Status: AC
  Administered 2018-01-30: 500 mL via INTRAVENOUS

## 2018-01-30 MED ORDER — SODIUM CHLORIDE 0.9 % IV BOLUS: 500.0000 mL | Freq: Once | INTRAVENOUS | Status: DC

## 2018-01-30 MED ORDER — THIAMINE HCL 100 MG/ML IJ SOLN
100.0000 mg | Freq: Every day | INTRAMUSCULAR | Status: DC
  Filled 2018-01-30: qty 1

## 2018-01-30 MED ORDER — INSULIN ASPART 100 UNIT/ML ~~LOC~~ SOLN
0.0000 [IU] | Freq: Three times a day (TID) | SUBCUTANEOUS | Status: DC
  Administered 2018-01-30: 3 [IU] via SUBCUTANEOUS
  Administered 2018-01-30: 18:00:00 5 [IU] via SUBCUTANEOUS
  Administered 2018-01-31 (×2): 3 [IU] via SUBCUTANEOUS
  Filled 2018-01-30 (×4): qty 1

## 2018-01-30 MED ORDER — INSULIN GLARGINE 100 UNIT/ML ~~LOC~~ SOLN
40.0000 [IU] | Freq: Every day | SUBCUTANEOUS | Status: DC
  Administered 2018-01-30: 40 [IU] via SUBCUTANEOUS
  Filled 2018-01-30 (×2): qty 0.4

## 2018-01-30 MED ORDER — LISINOPRIL 20 MG PO TABS
40.0000 mg | ORAL_TABLET | Freq: Every day | ORAL | Status: DC
  Administered 2018-01-30 – 2018-01-31 (×2): 40 mg via ORAL
  Filled 2018-01-30 (×2): qty 2

## 2018-01-30 MED ORDER — ADULT MULTIVITAMIN W/MINERALS CH
1.0000 | ORAL_TABLET | Freq: Every day | ORAL | Status: DC
  Administered 2018-01-30 – 2018-01-31 (×2): 1 via ORAL
  Filled 2018-01-30 (×2): qty 1

## 2018-01-30 MED ORDER — INSULIN ASPART 100 UNIT/ML ~~LOC~~ SOLN
0.0000 [IU] | Freq: Every day | SUBCUTANEOUS | Status: DC
  Administered 2018-01-30: 4 [IU] via SUBCUTANEOUS
  Filled 2018-01-30: qty 1

## 2018-01-30 NOTE — Progress Notes (Signed)
Pt. UOP remains low: 127 mL total for shift Pt. Remains on IVF @ 100 mL/h Discussed with MS Tukov, NP NP ordered 500 mL bolus, will continue to monitor pt. Closely.

## 2018-01-30 NOTE — Progress Notes (Signed)
Patient admitted with DKA, nausea, vomiting, hypokalemia, and hyponatremia. She has a h/o ETOH abuse.  She was on an insulin gtte and now transitioned off. No acute issues overnight She denies chest pain, palpitations, nausea and vomiting  Exam Constitutional: NAD HENT: PERRLA, neck is supple; trachea in midline Cardiovascular: Normal rate and regular rhythm, no murmur, gallop and no friction rub.  Pulmonary/Chest: CTAB Abdominal: Soft, non-distended, epigastric tenderness on palpation. No no guarding, rebound or referred rebound tenderness.  Musculoskeletal: She exhibits no edema or tenderness.  Neurological: She is alert and oriented to person, place, and time.  Skin: Skin is warm and dry. She is not diaphoretic.   Assessment and plan -Continue blood glucose levels with SSI coverage -Monitor and correct electrolytes Patient is stable for transfer out of the ICU   Nahom Carfagno S. [facility] ANP-BC Pulmonary and Critical Care Medicine [facility] Pager [phone] or [phone]  NB: This document was prepared using Dragon voice recognition software and may include unintentional dictation errors.

## 2018-01-30 NOTE — Consult Note (Signed)
Progress Note  Patient Name: Michaela Johnson Date of Encounter: 01/30/2018  Primary Cardiologist: No primary care provider on file.   Subjective   Nausea /vomiting better. Converted from IV insulin drip to intermittent dosing. IV hydration and supplementation of electrolyte abnormalities. Transferred from ICU to telemetry room today.  Inpatient Medications    Scheduled Meds: . diltiazem  90 mg Oral Q12H  . enoxaparin (LOVENOX) injection  40 mg Subcutaneous Q24H  . FLUoxetine  40 mg Oral Daily  . folic acid  1 mg Oral Daily  . insulin aspart  0-15 Units Subcutaneous TID WC  . insulin aspart  0-5 Units Subcutaneous QHS  . insulin glargine  40 Units Subcutaneous QHS  . lisinopril  40 mg Oral Daily  . multivitamin with minerals  1 tablet Oral Daily  . pantoprazole  40 mg Oral Daily  . silver sulfADIAZINE   Topical BID  . thiamine  100 mg Oral Daily   Or  . thiamine  100 mg Intravenous Daily   Continuous Infusions: . sodium chloride 5 mL/hr at 01/30/18 1000  . cefTRIAXone (ROCEPHIN)  IV Stopped (01/30/18 1105)   PRN Meds: acetaminophen **OR** acetaminophen, LORazepam **OR** LORazepam, metoprolol tartrate, ondansetron (ZOFRAN) IV, oxyCODONE   Vital Signs    Vitals:   01/30/18 1000 01/30/18 1100 01/30/18 1200 01/30/18 1426  BP: (!) 158/72 (!) 146/75 (!) 151/74 139/66  Pulse: 83 82 88 83  Resp: (!) 22 14 19 20   Temp:   98.6 F (37 C) 98.3 F (36.8 C)  TempSrc:   Oral Oral  SpO2: 100% 100% 98% 94%  Weight:      Height:        Intake/Output Summary (Last 24 hours) at 01/30/2018 1511 Last data filed at 01/30/2018 1105 Gross per 24 hour  Intake 2967.05 ml  Output 1692 ml  Net 1275.05 ml   Filed Weights   01/29/18 0529 01/29/18 1010  Weight: 134 lb (60.8 kg) 153 lb 14.1 oz (69.8 kg)    Telemetry    Sinus rhythm- Personally Reviewed  ECG    Follow-up EKG after diltiazem drip shows missed restoration of sinus rhythm with left bundle branch block.- Personally  Reviewed  Physical Exam   GEN: No acute distress.  A&Ox3 Neck: No JVD Cardiac: RRR, normal S1 & S2 no murmurs, rubs, or gallops.  Respiratory: Clear to auscultation bilaterally.  Nonlabored, good air movement GI: Soft, nontender, non-distended; NABS MS: No edema; No deformity. Neuro:  Nonfocal  Psych: Normal affect   Labs    Chemistry Recent Labs  Lab 01/29/18 0616  01/29/18 1547 01/29/18 1927 01/30/18 0531  NA 123*   < > 132* 133* 135  K 7.1*   < > 3.4* 4.2 4.6  CL 87*   < > 99 103 104  CO2 8*   < > 20* 21* 16*  GLUCOSE 835*   < > 301* 152* 253*  BUN 46*   < > 43* 43* 39*  CREATININE 1.54*   < > 1.56* 1.18* 1.19*  CALCIUM 9.0   < > 8.2* 8.0* 7.9*  PROT 7.5  --   --   --   --   ALBUMIN 3.5  --   --   --   --   AST 37  --   --   --   --   ALT 36  --   --   --   --   ALKPHOS 113  --   --   --   --  BILITOT 1.5*  --   --   --   --   GFRNONAA 36*   < > 36* 50* 49*  GFRAA 42*   < > 41* 58* 57*  ANIONGAP 28*   < > 13 9 15    < > = values in this interval not displayed.     Hematology Recent Labs  Lab 01/29/18 0616 01/30/18 0722  WBC 14.1* 13.9*  RBC 3.13* 3.28*  HGB 10.8* 11.3*  HCT 34.8* 33.0*  MCV 111.3* 100.5*  MCH 34.6* 34.5*  MCHC 31.1* 34.4  RDW 14.8* 13.6  PLT 319 277    Cardiac Enzymes Recent Labs  Lab 01/29/18 0616  TROPONINI <0.03   No results for input(s): TROPIPOC in the last 168 hours.   BNPNo results for input(s): BNP, PROBNP in the last 168 hours.   DDimer No results for input(s): DDIMER in the last 168 hours.   Radiology    No results found.  Cardiac Studies   2 D Echo ordered, but not yet done  Patient Profile     57 y.o. female with reportedly long-standing history of proximal atrial fibrillation dating back 2011, ongoing alcohol and polysubstance abuse, hypertension  who has insulin-dependent diabetes and history of DKA who presented to the [facility] emergency room on 01/29/2018  with signs and symptoms concerning for DKA. She is  noted to be in A. fib with RVR on admission.  This converted to sinus rhythm with IV diltiazem.  Maintaining sinus rhythm now.  Questionable candidate for antiregulation given history of non-adherence/compliance.  Assessment & Plan     Active Problems:   Atrial fibrillation with rapid ventricular response (HCC)   PAF (paroxysmal atrial fibrillation) (HCC)   DKA (diabetic ketoacidoses) (HCC)  This patients CHA2DS2-VASc Score and unadjusted Ischemic Stroke Rate (% per year) is equal to 3.2 % stroke rate/year from a score of 3  Above score calculated as 1 point each if present [CHF, HTN, DM, Vascular=MI/PAD/Aortic Plaque, Age if 19-74, or Female]; 2 points each if present [Age > 75, or Stroke/TIA/TE] is  DKA being managed by primary team.  Converted to long-acting insulin etc.  On prophylactic antibiotics.  A. fib RVR is resolved after short-term diltiazem infusion.  Now in sinus rhythm.  Will start low-dose diltiazem for rate control.  (Blood pressure will now likely tolerate).  --Diltiazem chosen> 2 avoid potentially masking symptoms of hypo-or hyperglycemia with beta-blocker. ACE inhibitor initially held, but n restarted.    Initiating anticoagulation at this point is probably not warranted, as there has been a lot of question about medical adherence - would need some additional education in the outpatient setting to ensure that she will take it.     [facility] will sign off.  (but will be available for ?s) Medication Recommendations:  Started diltiazem; can discuss anticoagulation in OP f/u - ? Regarding compliance Other recommendations (labs, testing, etc):  n/a Follow up as an outpatient:  Will arrange OP f/u with Dr.Gollan.  For questions or updates, please contact [place] Please consult [email or web address] for contact info under Cardiology/STEMI.      Signed, Glenetta Hew, MD  01/30/2018, 3:11 PM

## 2018-01-30 NOTE — Progress Notes (Signed)
Pt.'s CBG @ 0333: 48 Alerted Ms. Patria Mane, NP suggesting changing IVF from D5 0.45 to NS @ 100 mL  Verbal order given to that effect, instructed to recheck CBG closer to 0500 before treating.
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
## 2018-01-30 NOTE — Progress Notes (Signed)
*  PRELIMINARY RESULTS* Echocardiogram 2D Echocardiogram has been performed.  Michaela Johnson 01/30/2018, 5:25 PM

## 2018-01-30 NOTE — Plan of Care (Signed)
Pt.'s VSS O/N, A&O x 4, CIWA: 0, 2 episodes of nausea controlled by zofran PRN's, no reports of pain.   UOP dropped to < 30 mL/h, pt. Received a 500 mL bolus. Insulin gtt off at 0000. Pt. Placed on 2L [place] while sleeping, RA when awake. Report given to Hiral

## 2018-01-31 LAB — CBC
HEMATOCRIT: 34.2 % — AB (ref 35.0–47.0)
HEMOGLOBIN: 12 g/dL (ref 12.0–16.0)
MCH: 34.8 pg — ABNORMAL HIGH (ref 26.0–34.0)
MCHC: 35.1 g/dL (ref 32.0–36.0)
MCV: 99.1 fL (ref 80.0–100.0)
PLATELETS: 254 10*3/uL (ref 150–440)
RBC: 3.45 MIL/uL — AB (ref 3.80–5.20)
RDW: 14 % (ref 11.5–14.5)
WBC: 6.9 10*3/uL (ref 3.6–11.0)

## 2018-01-31 LAB — GLUCOSE, CAPILLARY
GLUCOSE-CAPILLARY: 173 mg/dL — AB (ref 70–99)
Glucose-Capillary: 168 mg/dL — ABNORMAL HIGH (ref 70–99)

## 2018-01-31 LAB — BASIC METABOLIC PANEL
Anion gap: 7 (ref 5–15)
BUN: 13 mg/dL (ref 6–20)
CHLORIDE: 102 mmol/L (ref 98–111)
CO2: 29 mmol/L (ref 22–32)
CREATININE: 0.67 mg/dL (ref 0.44–1.00)
Calcium: 8.3 mg/dL — ABNORMAL LOW (ref 8.9–10.3)
GFR calc non Af Amer: >60 mL/min (ref >60)
Glucose, Bld: 192 mg/dL — ABNORMAL HIGH (ref 70–99)
Potassium: 3.5 mmol/L (ref 3.5–5.1)
SODIUM: 138 mmol/L (ref 135–145)

## 2018-01-31 LAB — ECHOCARDIOGRAM COMPLETE
Height: 62 in
Weight: 2462.1 oz

## 2018-01-31 LAB — URINE CULTURE: Culture: >=100000 — AB

## 2018-01-31 LAB — LACTIC ACID, PLASMA: Lactic Acid, Venous: 1 mmol/L (ref 0.5–1.9)

## 2018-01-31 MED ORDER — DILTIAZEM HCL 90 MG PO TABS: 90.0000 mg | ORAL_TABLET | Freq: Two times a day (BID) | ORAL | 0 refills | Status: DC

## 2018-01-31 MED ORDER — SODIUM BICARBONATE 8.4 % IV SOLN
50.0000 meq | Freq: Once | INTRAVENOUS | Status: AC
  Administered 2018-01-31: 01:00:00 50 meq via INTRAVENOUS
  Filled 2018-01-31: qty 50

## 2018-01-31 MED ORDER — CEPHALEXIN 500 MG PO CAPS: 500.0000 mg | ORAL_CAPSULE | Freq: Two times a day (BID) | ORAL | Status: DC

## 2018-01-31 MED ORDER — ASPIRIN 81 MG PO CHEW: 81.0000 mg | CHEWABLE_TABLET | Freq: Every day | ORAL | 1 refills | Status: DC

## 2018-01-31 MED ORDER — CEPHALEXIN 500 MG PO CAPS: 500.0000 mg | ORAL_CAPSULE | Freq: Two times a day (BID) | ORAL | 0 refills | Status: DC

## 2018-01-31 MED ORDER — ASPIRIN 81 MG PO CHEW
81.0000 mg | CHEWABLE_TABLET | Freq: Every day | ORAL | Status: DC
Start: 2018-01-31 — End: 2018-01-31
  Administered 2018-01-31: 81 mg via ORAL
  Filled 2018-01-31: qty 1

## 2018-01-31 NOTE — Progress Notes (Signed)
MD paged d/t run of SVT, then converted to a-fib with heart rate in the upper 130's to upper 140's. Pt converted back to SR. HR now in 80's. MD to put in orders.
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
## 2018-02-01 ENCOUNTER — Telehealth: Payer: Self-pay

## 2018-02-01 NOTE — Telephone Encounter (Signed)
Lmov to schedule TCM appointment

## 2018-02-01 NOTE — Telephone Encounter (Signed)
Attempted to call patient/cell phone lmov/home phone was busy/ Will try again at a later time

## 2018-02-01 NOTE — Telephone Encounter (Signed)
-----   Message from Leonie Man, MD sent at 01/31/2018  6:02 PM EDT ----- Regarding: f/u appt with Gollan Pt admitted with DKA - had Afib RVR -- needs cards f/u.  Glenetta Hew, MD

## 2018-02-02 NOTE — Telephone Encounter (Signed)
TCM....  Patient is being discharged   They saw Dr Ellyn Hack   They are scheduled to see Dr Rockey Situ on 02/10/18  They were seen for DKA   They need to be seen within 7-10 days     Please call

## 2018-02-02 NOTE — Telephone Encounter (Signed)
Patient contacted regarding discharge from [facility] on 01/31/18.   Patient understands to follow up with provider ? On 02/10/18 at 10:20 at [facility].  Patient understands discharge instructions? Yes  Patient understands medications and regiment? Yes  Patient understands to bring all medications to this visit? Yes

## 2018-02-03 ENCOUNTER — Encounter: Payer: Self-pay | Admitting: Emergency Medicine

## 2018-02-03 ENCOUNTER — Inpatient Hospital Stay
Admission: EM | Admit: 2018-02-03 | Discharge: 2018-02-14 | DRG: 637 | Disposition: A | Discharge status: Home / Self Care | Payer: Self-pay | Attending: Internal Medicine | Admitting: Internal Medicine

## 2018-02-03 DIAGNOSIS — E875 Hyperkalemia: Secondary | ICD-10-CM

## 2018-02-03 DIAGNOSIS — E871 Hypo-osmolality and hyponatremia: Secondary | ICD-10-CM | POA: Diagnosis not present

## 2018-02-03 DIAGNOSIS — J690 Pneumonitis due to inhalation of food and vomit: Secondary | ICD-10-CM

## 2018-02-03 DIAGNOSIS — Z9114 Patient's other noncompliance with medication regimen: Secondary | ICD-10-CM

## 2018-02-03 DIAGNOSIS — E1010 Type 1 diabetes mellitus with ketoacidosis without coma: Principal | ICD-10-CM

## 2018-02-03 DIAGNOSIS — F1010 Alcohol abuse, uncomplicated: Secondary | ICD-10-CM | POA: Diagnosis present

## 2018-02-03 DIAGNOSIS — Z833 Family history of diabetes mellitus: Secondary | ICD-10-CM

## 2018-02-03 DIAGNOSIS — N183 Chronic kidney disease, stage 3 (moderate): Secondary | ICD-10-CM | POA: Diagnosis present

## 2018-02-03 DIAGNOSIS — I130 Hypertensive heart and chronic kidney disease with heart failure and stage 1 through stage 4 chronic kidney disease, or unspecified chronic kidney disease: Secondary | ICD-10-CM | POA: Diagnosis present

## 2018-02-03 DIAGNOSIS — E860 Dehydration: Secondary | ICD-10-CM | POA: Diagnosis present

## 2018-02-03 DIAGNOSIS — I480 Paroxysmal atrial fibrillation: Secondary | ICD-10-CM | POA: Diagnosis present

## 2018-02-03 DIAGNOSIS — F17210 Nicotine dependence, cigarettes, uncomplicated: Secondary | ICD-10-CM | POA: Diagnosis present

## 2018-02-03 DIAGNOSIS — J9690 Respiratory failure, unspecified, unspecified whether with hypoxia or hypercapnia: Secondary | ICD-10-CM

## 2018-02-03 DIAGNOSIS — J9601 Acute respiratory failure with hypoxia: Secondary | ICD-10-CM | POA: Diagnosis not present

## 2018-02-03 DIAGNOSIS — R0602 Shortness of breath: Secondary | ICD-10-CM

## 2018-02-03 DIAGNOSIS — E1110 Type 2 diabetes mellitus with ketoacidosis without coma: Secondary | ICD-10-CM | POA: Diagnosis present

## 2018-02-03 DIAGNOSIS — N179 Acute kidney failure, unspecified: Secondary | ICD-10-CM | POA: Diagnosis present

## 2018-02-03 DIAGNOSIS — K219 Gastro-esophageal reflux disease without esophagitis: Secondary | ICD-10-CM | POA: Diagnosis present

## 2018-02-03 DIAGNOSIS — G9341 Metabolic encephalopathy: Secondary | ICD-10-CM | POA: Diagnosis present

## 2018-02-03 DIAGNOSIS — J9600 Acute respiratory failure, unspecified whether with hypoxia or hypercapnia: Secondary | ICD-10-CM

## 2018-02-03 DIAGNOSIS — I248 Other forms of acute ischemic heart disease: Secondary | ICD-10-CM | POA: Diagnosis present

## 2018-02-03 DIAGNOSIS — R110 Nausea: Secondary | ICD-10-CM

## 2018-02-03 DIAGNOSIS — D72829 Elevated white blood cell count, unspecified: Secondary | ICD-10-CM

## 2018-02-03 DIAGNOSIS — Z7982 Long term (current) use of aspirin: Secondary | ICD-10-CM

## 2018-02-03 DIAGNOSIS — E1042 Type 1 diabetes mellitus with diabetic polyneuropathy: Secondary | ICD-10-CM | POA: Diagnosis present

## 2018-02-03 DIAGNOSIS — Z8249 Family history of ischemic heart disease and other diseases of the circulatory system: Secondary | ICD-10-CM

## 2018-02-03 DIAGNOSIS — Z794 Long term (current) use of insulin: Secondary | ICD-10-CM

## 2018-02-03 DIAGNOSIS — I5030 Unspecified diastolic (congestive) heart failure: Secondary | ICD-10-CM | POA: Diagnosis present

## 2018-02-03 LAB — BASIC METABOLIC PANEL
BUN: 41 mg/dL — AB (ref 6–20)
CALCIUM: 8.4 mg/dL — AB (ref 8.9–10.3)
CO2: <7 mmol/L — ABNORMAL LOW (ref 22–32)
CREATININE: 1.96 mg/dL — AB (ref 0.44–1.00)
Chloride: 76 mmol/L — ABNORMAL LOW (ref 98–111)
GFR calc Af Amer: 31 mL/min — ABNORMAL LOW (ref >60)
GFR calc non Af Amer: 27 mL/min — ABNORMAL LOW (ref >60)
GLUCOSE: 958 mg/dL — AB (ref 70–99)
POTASSIUM: 7.2 mmol/L — AB (ref 3.5–5.1)
Sodium: 117 mmol/L — CL (ref 135–145)

## 2018-02-03 LAB — URINALYSIS, COMPLETE (UACMP) WITH MICROSCOPIC
BILIRUBIN URINE: NEGATIVE
Bacteria, UA: NONE SEEN
Glucose, UA: >=500 mg/dL — AB
Hgb urine dipstick: NEGATIVE
Ketones, ur: 80 mg/dL — AB
Leukocytes, UA: NEGATIVE
Nitrite: NEGATIVE
PH: 5 (ref 5.0–8.0)
Protein, ur: 30 mg/dL — AB
SPECIFIC GRAVITY, URINE: 1.015 (ref 1.005–1.030)

## 2018-02-03 LAB — CULTURE, BLOOD (ROUTINE X 2)
CULTURE: NO GROWTH
Culture: NO GROWTH
SPECIAL REQUESTS: ADEQUATE
Special Requests: ADEQUATE

## 2018-02-03 LAB — GLUCOSE, CAPILLARY: Glucose-Capillary: >600 mg/dL (ref 70–99)

## 2018-02-03 LAB — TROPONIN I: Troponin I: 0.07 ng/mL (ref <0.03)

## 2018-02-03 MED ORDER — IPRATROPIUM-ALBUTEROL 0.5-2.5 (3) MG/3ML IN SOLN
3.0000 mL | Freq: Once | RESPIRATORY_TRACT | Status: AC
  Administered 2018-02-03: 3 mL via RESPIRATORY_TRACT
  Filled 2018-02-03: qty 3

## 2018-02-03 MED ORDER — SODIUM CHLORIDE 0.9 % IV SOLN
INTRAVENOUS | Status: DC
  Administered 2018-02-03: 4.4 [IU]/h via INTRAVENOUS
  Filled 2018-02-03: qty 1

## 2018-02-03 MED ORDER — SODIUM CHLORIDE 0.9 % IV SOLN
1.0000 g | Freq: Once | INTRAVENOUS | Status: AC
  Administered 2018-02-03: 1 g via INTRAVENOUS
  Filled 2018-02-03: qty 10

## 2018-02-03 MED ORDER — SODIUM CHLORIDE 0.9 % IV BOLUS
1000.0000 mL | Freq: Once | INTRAVENOUS | Status: AC
  Administered 2018-02-03: 1000 mL via INTRAVENOUS

## 2018-02-03 NOTE — ED Provider Notes (Signed)
[facility] Emergency Department Provider Note  ____________________________________________   I have reviewed the triage vital signs and the nursing notes.   HISTORY  Chief Complaint Weakness and Emesis   History limited by: Not Limited   HPI Michaela Johnson is a 57 y.o. female who presents to the emergency department today because of concerns for nausea and vomiting.  She states she has been nauseous and has had vomiting for the past 3 days.  This has been accompanied by some abdominal discomfort.  Patient had a recent admission to the hospital.  She was admitted for DKA.  Is unclear she has been taking her medications.  She denies any fevers.    Per medical record review patient has a history of recent admission for diabetic ketoacidosis  Past Medical History:  Diagnosis Date  . Alcohol use   . Depression   . Diabetes mellitus without complication ([place])   . GERD (gastroesophageal reflux disease)   . Hypertension   . Neuropathy   . Tobacco use     Patient Active Problem List   Diagnosis Date Noted  . PAF (paroxysmal atrial fibrillation) ([place]) 01/30/2018  . Atrial fibrillation with rapid ventricular response ([place]) 01/30/2018  . Seizure ([place])   . DKA (diabetic ketoacidoses) ([place]) 06/07/2016    Past Surgical History:  Procedure Laterality Date  . NO PAST SURGERIES      Prior to Admission medications   Medication Sig Start Date End Date Taking? Authorizing Provider  aspirin 81 MG chewable tablet Chew 1 tablet (81 mg total) by mouth daily. 01/31/18   Demetrios Loll, MD  cephALEXin (KEFLEX) 500 MG capsule Take 1 capsule (500 mg total) by mouth every 12 (twelve) hours. 02/01/18   Demetrios Loll, MD  Cholecalciferol (VITAMIN D3) 400 units CAPS Take 1 capsule by mouth daily.    [provider]  diltiazem (CARDIZEM) 90 MG tablet Take 1 tablet (90 mg total) by mouth every 12 (twelve) hours. 01/31/18   Demetrios Loll, MD  FLUoxetine (PROZAC) 40 MG capsule  Take 40 mg by mouth daily.    [provider]  insulin glargine (LANTUS) 100 UNIT/ML injection Inject 50 Units into the skin daily.    [provider]  lisinopril (PRINIVIL,ZESTRIL) 40 MG tablet Take 40 mg by mouth daily.    [provider]  Multiple Vitamin (MULTI-VITAMINS) TABS Take 1 tablet by mouth daily.    [provider]  Omega-3 Fatty Acids (FISH OIL) 1000 MG CAPS Take 1 tablet by mouth daily.    [provider]  omeprazole (PRILOSEC) 20 MG capsule Take 1 capsule by mouth daily.    [provider]    Allergies Patient has no known allergies.  Family History  Problem Relation Age of Onset  . Diabetes Mother   . CAD Father     Social History Social History   Tobacco Use  . Smoking status: Current Some Day Smoker    Packs/day: 1.00    Years: 41.00    Pack years: 41.00  . Smokeless tobacco: Never Used  Substance Use Topics  . Alcohol use: Yes    Comment: 42 per week  . Drug use: Yes    Types: Marijuana    Review of Systems Constitutional: No fever/chills Eyes: No visual changes. ENT: No sore throat. Cardiovascular: Denies chest pain. Respiratory: Denies shortness of breath. Gastrointestinal: Positive for nausea vomiting Genitourinary: Negative for dysuria. Musculoskeletal: Negative for back pain. Skin: Negative for rash. Neurological: Negative for headaches, focal  weakness or numbness.  ____________________________________________   PHYSICAL EXAM:  VITAL SIGNS: ED Triage Vitals [02/03/18 2158]  Enc Vitals Group     BP 93/56     Pulse 90     Resp 23     Temp      Temp src      SpO2      Weight 155 lb (70.3 kg)    Constitutional: Awake and alert.  Eyes: Conjunctivae are normal.  ENT      Head: Normocephalic and atraumatic.      Nose: No congestion/rhinnorhea.      Mouth/Throat: Mucous membranes are moist.      Neck: No stridor. Hematological/Lymphatic/Immunilogical: No cervical  lymphadenopathy. Cardiovascular: Normal rate, regular rhythm.  No murmurs, rubs, or gallops.  Respiratory: Normal respiratory effort without tachypnea nor retractions. Breath sounds are clear and equal bilaterally. No wheezes/rales/rhonchi. Gastrointestinal: Soft and non tender. No rebound. No guarding.  Genitourinary: Deferred Musculoskeletal: Normal range of motion in all extremities. No lower extremity edema. Neurologic:  Normal speech and language. No gross focal neurologic deficits are appreciated.  Skin:  Ulcer noted to her right foot. No surrounding erythema.  ____________________________________________    LABS (pertinent positives/negatives)  UDS negative Trop 0.07 BMP na 117, k 7.2, glu 958, cr 1.96 CBC wbc 23.6, hgb 11.7, plt 367 UA not consistent with infection VBG pH <6.9 ____________________________________________   EKG  I, Nance Pear, attending physician, personally viewed and interpreted this EKG  EKG Time: 2157 Rate: 92 Rhythm: sinus rhythm Axis: normal Intervals: qtc 497 QRS: wide ST changes: peaked t waves Impression: abnormal ekg  ____________________________________________    RADIOLOGY  None  ____________________________________________   PROCEDURES  Procedures  CRITICAL CARE Performed by: Nance Pear   Total critical care time: 40 minutes  Critical care time was exclusive of separately billable procedures and treating other patients.  Critical care was necessary to treat or prevent imminent or life-threatening deterioration.  Critical care was time spent personally by me on the following activities: development of treatment plan with patient and/or surrogate as well as nursing, discussions with consultants, evaluation of patient's response to treatment, examination of patient, obtaining history from patient or surrogate, ordering and performing treatments and interventions, ordering and review of laboratory studies, ordering  and review of radiographic studies, pulse oximetry and re-evaluation of patient's condition.  ____________________________________________   INITIAL IMPRESSION / ASSESSMENT AND PLAN / ED COURSE  Pertinent labs & imaging results that were available during my care of the patient were reviewed by me and considered in my medical decision making (see chart for details).   Patient presented to the emergency department today because of concerns for nausea and vomiting.  Patient had recent admission for diabetic ketoacidosis.  The patient work-up is consistent with diabetic ketoacidosis again.  pH is less than 6.9.  Patient's potassium was also found to be elevated which be consistent with her EKG.  Looking back she had similar hyperkalemia and EKG changes on her previous admission.  Patient was written for insulin, calcium and albuterol.  Patient will be admitted to the hospital service.   ____________________________________________   FINAL CLINICAL IMPRESSION(S) / ED DIAGNOSES  Final diagnoses:  Diabetic ketoacidosis without coma associated with type 1 diabetes mellitus ([place])  Hyperkalemia     Note: This dictation was prepared with Dragon dictation. Any transcriptional errors that result from this process are unintentional     Nance Pear, MD 02/03/18 2345

## 2018-02-03 NOTE — ED Triage Notes (Signed)
Pt arrived to ED via EMS from home where husband called out dur to pt having N/V, weakness. EMS reports pt was lethargic on arrival, CBG 595 and c/o generalized pain and weakness. Pt is A&O x3 on arrival to ED. MD at bedside.

## 2018-02-03 NOTE — H&P (Signed)
[facility] [place] NAME: Michaela Johnson    MR#:  [id number]  DATE OF BIRTH:  July 07, 1961  DATE OF ADMISSION:  02/03/2018  PRIMARY CARE PHYSICIAN: [name], NP   REQUESTING/REFERRING PHYSICIAN:   CHIEF COMPLAINT:   Chief Complaint  Patient presents with  . Weakness  . Emesis    HISTORY OF PRESENT ILLNESS: Michaela Johnson  is a 57 y.o. female with a known history of tobacco and alcohol abuse, diabetes, hypertension, GERD, peripheral neuropathy, depression disorder. Patient is currently too lethargic, unable to provide reliable history.  Most of the information was taken from reviewing the medical chart and discussion with emergency room physician. Patient was brought to emergency room for nausea, vomiting and generalized weakness and lethargy going on for the past 3 days, gradually getting worse.  Blood sugar at home, per husband was elevated at 585.  When checked in the emergency room, glucose level was noted to be at 958.  It is uncertain if patient has been compliant with taking her insulin.  The reminder of the blood test is notable for elevated potassium level is 7.2 low sodium level 117, elevated creatinine level of 1.96 troponin level is 0.07.  pH is lower than 6.9 per venous blood gas.  WBC is 23.6.  UA is negative for infection. EKG shows wide QRS and peaked T waves, likely related to hyperkalemia.  Patient was treated with calcium gluconate and albuterol treatment in the emergency room. Patient is admitted to intensive care unit for further evaluation and treatment.  PAST MEDICAL HISTORY:   Past Medical History:  Diagnosis Date  . Alcohol use   . Depression   . Diabetes mellitus without complication ([place])   . GERD (gastroesophageal reflux disease)   . Hypertension   . Neuropathy   . Tobacco use     PAST SURGICAL HISTORY:  Past Surgical History:  Procedure Laterality Date  . NO PAST SURGERIES      SOCIAL HISTORY:   Social History   Tobacco Use  . Smoking status: Current Some Day Smoker    Packs/day: 1.00    Years: 41.00    Pack years: 41.00  . Smokeless tobacco: Never Used  Substance Use Topics  . Alcohol use: Yes    Comment: 42 per week    FAMILY HISTORY:  Family History  Problem Relation Age of Onset  . Diabetes Mother   . CAD Father     DRUG ALLERGIES: No Known Allergies  REVIEW OF SYSTEMS:   Unable to obtain, due to patient being lethargic.  MEDICATIONS AT HOME:  Prior to Admission medications   Medication Sig Start Date End Date Taking? Authorizing Provider  aspirin 81 MG chewable tablet Chew 1 tablet (81 mg total) by mouth daily. 01/31/18  Yes Demetrios Loll, MD  cephALEXin (KEFLEX) 500 MG capsule Take 1 capsule (500 mg total) by mouth every 12 (twelve) hours. 02/01/18  Yes Demetrios Loll, MD  Cholecalciferol (VITAMIN D3) 400 units CAPS Take 1 capsule by mouth daily.   Yes [provider]  diltiazem (CARDIZEM) 90 MG tablet Take 1 tablet (90 mg total) by mouth every 12 (twelve) hours. 01/31/18  Yes Demetrios Loll, MD  FLUoxetine (PROZAC) 40 MG capsule Take 40 mg by mouth daily.   Yes [provider]  insulin glargine (LANTUS) 100 UNIT/ML injection Inject 50 Units into the skin daily.   Yes [provider]  lisinopril (PRINIVIL,ZESTRIL) 40 MG tablet Take 40 mg  by mouth daily.   Yes [provider]  Multiple Vitamin (MULTI-VITAMINS) TABS Take 1 tablet by mouth daily.   Yes [provider]  Omega-3 Fatty Acids (FISH OIL) 1000 MG CAPS Take 1 tablet by mouth daily.   Yes [provider]  omeprazole (PRILOSEC) 20 MG capsule Take 1 capsule by mouth daily.   Yes [provider]      PHYSICAL EXAMINATION:   VITAL SIGNS: Blood pressure (!) 98/51, pulse 90, resp. rate (!) 24, weight 70.3 kg (155 lb), SpO2 100 %.  GENERAL:  57 y.o.-year-old patient lying in the bed, very uncomfortable, tachypneic.  She looks acutely ill. EYES: Pupils  equal, round, reactive to light and accommodation. No scleral icterus.   HEENT: Head atraumatic, normocephalic. Oropharynx and nasopharynx clear.  NECK:  Supple, no jugular venous distention. No thyroid enlargement, no tenderness.  LUNGS: Tachypneic.  Normal breath sounds bilaterally, no wheezing. No use of accessory muscles of respiration.  CARDIOVASCULAR: S1, S2 normal. No S3/S4.  ABDOMEN: Soft, nontender, nondistended. Bowel sounds present. No organomegaly or mass.  EXTREMITIES: No pedal edema, cyanosis, or clubbing.  NEUROLOGIC: No focal weakness PSYCHIATRIC: The patient is alert, but lethargic.  SKIN: Abrasions are noted at the bottom of both feet.  No bleeding or drainage noted.  LABORATORY PANEL:   CBC Recent Labs  Lab 01/29/18 0616 01/30/18 0722 01/31/18 0727 02/03/18 2219  WBC 14.1* 13.9* 6.9 23.6*  HGB 10.8* 11.3* 12.0 11.7*  HCT 34.8* 33.0* 34.2* 38.7  PLT 319 277 254 367  MCV 111.3* 100.5* 99.1 117.4*  MCH 34.6* 34.5* 34.8* 35.6*  MCHC 31.1* 34.4 35.1 30.4*  RDW 14.8* 13.6 14.0 15.1*  LYMPHSABS  --   --   --  PENDING  MONOABS  --   --   --  PENDING  EOSABS  --   --   --  PENDING  BASOSABS  --   --   --  PENDING   ------------------------------------------------------------------------------------------------------------------  Chemistries  Recent Labs  Lab 01/29/18 0616  01/29/18 1547 01/29/18 1927 01/30/18 0531 01/31/18 0600 02/03/18 2219  NA 123*   < > 132* 133* 135 138 117*  K 7.1*   < > 3.4* 4.2 4.6 3.5 7.2*  CL 87*   < > 99 103 104 102 76*  CO2 8*   < > 20* 21* 16* 29 <7*  GLUCOSE 835*   < > 301* 152* 253* 192* 958*  BUN 46*   < > 43* 43* 39* 13 41*  CREATININE 1.54*   < > 1.56* 1.18* 1.19* 0.67 1.96*  CALCIUM 9.0   < > 8.2* 8.0* 7.9* 8.3* 8.4*  MG  --   --   --  1.9 2.3  --   --   AST 37  --   --   --   --   --   --   ALT 36  --   --   --   --   --   --   ALKPHOS 113  --   --   --   --   --   --   BILITOT 1.5*  --   --   --   --   --   --     < > = values in this interval not displayed.   ------------------------------------------------------------------------------------------------------------------ estimated creatinine clearance is 28.7 mL/min (A) (by C-G formula based on SCr of 1.96 mg/dL (H)). ------------------------------------------------------------------------------------------------------------------ No results for input(s): TSH, T4TOTAL, T3FREE, THYROIDAB in the  last 72 hours.  Invalid input(s): FREET3   Coagulation profile No results for input(s): INR, PROTIME in the last 168 hours. ------------------------------------------------------------------------------------------------------------------- No results for input(s): DDIMER in the last 72 hours. -------------------------------------------------------------------------------------------------------------------  Cardiac Enzymes Recent Labs  Lab 01/29/18 0616 02/03/18 2219  TROPONINI <0.03 0.07*   ------------------------------------------------------------------------------------------------------------------ Invalid input(s): POCBNP  ---------------------------------------------------------------------------------------------------------------  Urinalysis    Component Value Date/Time   COLORURINE YELLOW (A) 02/03/2018 2219   APPEARANCEUR HAZY (A) 02/03/2018 2219   LABSPEC 1.015 02/03/2018 2219   PHURINE 5.0 02/03/2018 2219   GLUCOSEU >=500 (A) 02/03/2018 2219   HGBUR NEGATIVE 02/03/2018 2219   [place] NEGATIVE 02/03/2018 2219   KETONESUR 80 (A) 02/03/2018 2219   PROTEINUR 30 (A) 02/03/2018 2219   NITRITE NEGATIVE 02/03/2018 2219   LEUKOCYTESUR NEGATIVE 02/03/2018 2219     RADIOLOGY: No results found.  EKG: Orders placed or performed during the hospital encounter of 02/03/18  . EKG 12-Lead  . EKG 12-Lead    IMPRESSION AND PLAN:  1.  DKA, likely secondary to noncompliance with insulin treatment.  No signs of infection.  Will start  IV fluids at high rate and insulin drip. 2.  Acute metabolic encephalopathy, secondary to DKA.  We will continue to monitor clinically closely while treating for DKA., 3.  Hyperkalemia and pseudohyponatremia secondary to DKA.  Continue to monitor potassium and sodium level closely while treating for DKA.  Repeat EKG to re-eval for hyperkalemia changes. 4.  Elevated troponin level secondary to demand ischemia from DKA.  Continue to monitor on telemetry and follow troponin levels to rule out ACS. 5.  Acute renal failure.  We will start IV fluids and monitor kidney function closely.  Avoid nephrotoxic medications. 6.  Acute metabolic acidosis, will start treatment with bicarbonate.  Will repeat ABG. 7.  History of alcohol abuse.  Current alcohol level is within normal limits.  Will monitor for withdrawal symptoms.  Will place patient on CIWA protocol, which includes thiamine replacement.  All the records are reviewed and case discussed with ED and ICU providers.   CODE STATUS: Full Code Status History    Date Active Date Inactive Code Status Order ID Comments User Context   01/29/2018 0738 01/31/2018 2059 Full Code [id number]  Loletha Grayer, MD ED   04/15/2017 1552 04/17/2017 1859 Full Code [id number]  Gladstone Lighter, MD Inpatient   04/15/2017 1537 04/15/2017 1552 Full Code [id number]  Awilda Bill, NP ED   06/07/2016 1349 06/10/2016 1516 Full Code [id number]  Bettey Costa, MD ED   06/07/2016 1349 06/07/2016 1349 Full Code [id number]  Bettey Costa, MD ED       TOTAL TIME TAKING CARE OF THIS PATIENT: 50 minutes.    Amelia Jo M.D on 02/03/2018 at 11:27 PM  Between 7am to 6pm - Pager - [phone]  After 6pm go to [email or web address] - password [email or web address] [facility]  [place]  Office  [phone]  CC: Primary care physician; [name], NP

## 2018-02-04 ENCOUNTER — Inpatient Hospital Stay: Payer: Self-pay

## 2018-02-04 DIAGNOSIS — E875 Hyperkalemia: Secondary | ICD-10-CM

## 2018-02-04 DIAGNOSIS — E1010 Type 1 diabetes mellitus with ketoacidosis without coma: Principal | ICD-10-CM

## 2018-02-04 DIAGNOSIS — D72825 Bandemia: Secondary | ICD-10-CM

## 2018-02-04 LAB — BASIC METABOLIC PANEL
ANION GAP: 32 — AB (ref 5–15)
ANION GAP: 23 — AB (ref 5–15)
ANION GAP: 15 (ref 5–15)
ANION GAP: 12 (ref 5–15)
BUN: 35 mg/dL — AB (ref 6–20)
BUN: 37 mg/dL — ABNORMAL HIGH (ref 6–20)
BUN: 39 mg/dL — AB (ref 6–20)
BUN: 41 mg/dL — AB (ref 6–20)
BUN: 41 mg/dL — ABNORMAL HIGH (ref 6–20)
CALCIUM: 7.1 mg/dL — AB (ref 8.9–10.3)
CALCIUM: 7.3 mg/dL — AB (ref 8.9–10.3)
CALCIUM: 7.7 mg/dL — AB (ref 8.9–10.3)
CALCIUM: 8.2 mg/dL — AB (ref 8.9–10.3)
CO2: 7 mmol/L — ABNORMAL LOW (ref 22–32)
CO2: 13 mmol/L — ABNORMAL LOW (ref 22–32)
CO2: 20 mmol/L — AB (ref 22–32)
CO2: 23 mmol/L (ref 22–32)
CREATININE: 1.95 mg/dL — AB (ref 0.44–1.00)
CREATININE: 2.02 mg/dL — AB (ref 0.44–1.00)
CREATININE: 2.03 mg/dL — AB (ref 0.44–1.00)
Calcium: 7.5 mg/dL — ABNORMAL LOW (ref 8.9–10.3)
Chloride: 86 mmol/L — ABNORMAL LOW (ref 98–111)
Chloride: 93 mmol/L — ABNORMAL LOW (ref 98–111)
Chloride: 91 mmol/L — ABNORMAL LOW (ref 98–111)
Chloride: 99 mmol/L (ref 98–111)
Chloride: 99 mmol/L (ref 98–111)
Creatinine, Ser: 1.48 mg/dL — ABNORMAL HIGH (ref 0.44–1.00)
Creatinine, Ser: 1.29 mg/dL — ABNORMAL HIGH (ref 0.44–1.00)
GFR calc Af Amer: 30 mL/min — ABNORMAL LOW (ref >60)
GFR calc Af Amer: 31 mL/min — ABNORMAL LOW (ref >60)
GFR calc Af Amer: 30 mL/min — ABNORMAL LOW (ref >60)
GFR calc Af Amer: 52 mL/min — ABNORMAL LOW (ref >60)
GFR calc non Af Amer: 26 mL/min — ABNORMAL LOW (ref >60)
GFR calc non Af Amer: 27 mL/min — ABNORMAL LOW (ref >60)
GFR calc non Af Amer: 38 mL/min — ABNORMAL LOW (ref >60)
GFR, EST AFRICAN AMERICAN: 44 mL/min — AB (ref >60)
GFR, EST NON AFRICAN AMERICAN: 26 mL/min — AB (ref >60)
GFR, EST NON AFRICAN AMERICAN: 45 mL/min — AB (ref >60)
GLUCOSE: 189 mg/dL — AB (ref 70–99)
GLUCOSE: 303 mg/dL — AB (ref 70–99)
GLUCOSE: 463 mg/dL — AB (ref 70–99)
GLUCOSE: 678 mg/dL — AB (ref 70–99)
GLUCOSE: 909 mg/dL — AB (ref 70–99)
POTASSIUM: 3.8 mmol/L (ref 3.5–5.1)
Potassium: 6.5 mmol/L (ref 3.5–5.1)
Potassium: 3.9 mmol/L (ref 3.5–5.1)
Potassium: 4.2 mmol/L (ref 3.5–5.1)
Potassium: 3.7 mmol/L (ref 3.5–5.1)
Sodium: 123 mmol/L — ABNORMAL LOW (ref 135–145)
Sodium: 129 mmol/L — ABNORMAL LOW (ref 135–145)
Sodium: 130 mmol/L — ABNORMAL LOW (ref 135–145)
Sodium: 134 mmol/L — ABNORMAL LOW (ref 135–145)
Sodium: 134 mmol/L — ABNORMAL LOW (ref 135–145)

## 2018-02-04 LAB — URINE DRUG SCREEN, QUALITATIVE (ARMC ONLY)
AMPHETAMINES, UR SCREEN: NOT DETECTED
Benzodiazepine, Ur Scrn: NOT DETECTED
COCAINE METABOLITE, UR ~~LOC~~: NOT DETECTED
Cannabinoid 50 Ng, Ur ~~LOC~~: NOT DETECTED
MDMA (Ecstasy)Ur Screen: NOT DETECTED
METHADONE SCREEN, URINE: NOT DETECTED
OPIATE, UR SCREEN: NOT DETECTED
Phencyclidine (PCP) Ur S: NOT DETECTED
Tricyclic, Ur Screen: NOT DETECTED

## 2018-02-04 LAB — BLOOD GAS, ARTERIAL
ACID-BASE DEFICIT: 21.7 mmol/L — AB (ref 0.0–2.0)
ACID-BASE EXCESS: 1.2 mmol/L (ref 0.0–2.0)
BICARBONATE: 6.4 mmol/L — AB (ref 20.0–28.0)
Bicarbonate: 25 mmol/L (ref 20.0–28.0)
FIO2: 0.21
FIO2: 0.65
O2 SAT: 77.3 %
O2 Saturation: 92.5 %
PATIENT TEMPERATURE: 37
PH ART: 7.09 — AB (ref 7.350–7.450)
PH ART: 7.45 (ref 7.350–7.450)
Patient temperature: 37
pCO2 arterial: 21 mmHg — ABNORMAL LOW (ref 32.0–48.0)
pCO2 arterial: 36 mmHg (ref 32.0–48.0)
pO2, Arterial: 59 mmHg — ABNORMAL LOW (ref 83.0–108.0)
pO2, Arterial: 62 mmHg — ABNORMAL LOW (ref 83.0–108.0)

## 2018-02-04 LAB — GLUCOSE, CAPILLARY
GLUCOSE-CAPILLARY: 586 mg/dL — AB (ref 70–99)
GLUCOSE-CAPILLARY: 497 mg/dL — AB (ref 70–99)
GLUCOSE-CAPILLARY: 446 mg/dL — AB (ref 70–99)
GLUCOSE-CAPILLARY: 369 mg/dL — AB (ref 70–99)
GLUCOSE-CAPILLARY: 196 mg/dL — AB (ref 70–99)
GLUCOSE-CAPILLARY: 128 mg/dL — AB (ref 70–99)
GLUCOSE-CAPILLARY: 127 mg/dL — AB (ref 70–99)
GLUCOSE-CAPILLARY: 123 mg/dL — AB (ref 70–99)
Glucose-Capillary: >600 mg/dL (ref 70–99)
Glucose-Capillary: 458 mg/dL — ABNORMAL HIGH (ref 70–99)
Glucose-Capillary: 316 mg/dL — ABNORMAL HIGH (ref 70–99)
Glucose-Capillary: 279 mg/dL — ABNORMAL HIGH (ref 70–99)
Glucose-Capillary: 178 mg/dL — ABNORMAL HIGH (ref 70–99)
Glucose-Capillary: 158 mg/dL — ABNORMAL HIGH (ref 70–99)
Glucose-Capillary: 119 mg/dL — ABNORMAL HIGH (ref 70–99)

## 2018-02-04 LAB — ETHANOL: Alcohol, Ethyl (B): <10 mg/dL (ref <10)

## 2018-02-04 LAB — CBC WITH DIFFERENTIAL/PLATELET
BAND NEUTROPHILS: 1 %
BASOS ABS: 0 10*3/uL (ref 0–0.1)
BASOS PCT: 0 %
Blasts: 0 %
EOS ABS: 0 10*3/uL (ref 0–0.7)
EOS PCT: 0 %
HCT: 38.7 % (ref 35.0–47.0)
Hemoglobin: 11.7 g/dL — ABNORMAL LOW (ref 12.0–16.0)
LYMPHS ABS: 2.4 10*3/uL (ref 1.0–3.6)
LYMPHS PCT: 10 %
MCH: 35.6 pg — AB (ref 26.0–34.0)
MCHC: 30.4 g/dL — AB (ref 32.0–36.0)
MCV: 117.4 fL — AB (ref 80.0–100.0)
METAMYELOCYTES PCT: 0 %
MONO ABS: 1.2 10*3/uL — AB (ref 0.2–0.9)
MONOS PCT: 5 %
Myelocytes: 0 %
NEUTROS ABS: 20 10*3/uL — AB (ref 1.4–6.5)
Neutrophils Relative %: 84 %
OTHER: 0 %
PLATELETS: 367 10*3/uL (ref 150–440)
Promyelocytes Relative: 0 %
RBC: 3.3 MIL/uL — ABNORMAL LOW (ref 3.80–5.20)
RDW: 15.1 % — AB (ref 11.5–14.5)
WBC: 23.6 10*3/uL — ABNORMAL HIGH (ref 3.6–11.0)
nRBC: 0 /100 WBC

## 2018-02-04 LAB — CBC
HCT: 33 % — ABNORMAL LOW (ref 35.0–47.0)
Hemoglobin: 10.4 g/dL — ABNORMAL LOW (ref 12.0–16.0)
MCH: 34.3 pg — ABNORMAL HIGH (ref 26.0–34.0)
MCHC: 31.4 g/dL — ABNORMAL LOW (ref 32.0–36.0)
MCV: 109.2 fL — ABNORMAL HIGH (ref 80.0–100.0)
PLATELETS: 332 10*3/uL (ref 150–440)
RBC: 3.02 MIL/uL — AB (ref 3.80–5.20)
RDW: 14.5 % (ref 11.5–14.5)
WBC: 24.4 10*3/uL — AB (ref 3.6–11.0)

## 2018-02-04 LAB — TROPONIN I
Troponin I: 0.05 ng/mL (ref <0.03)
Troponin I: 0.12 ng/mL (ref <0.03)

## 2018-02-04 LAB — MRSA PCR SCREENING: MRSA by PCR: NEGATIVE

## 2018-02-04 LAB — MAGNESIUM: Magnesium: 2.3 mg/dL (ref 1.7–2.4)

## 2018-02-04 LAB — PROCALCITONIN: PROCALCITONIN: 0.44 ng/mL

## 2018-02-04 LAB — ALBUMIN: Albumin: 3 g/dL — ABNORMAL LOW (ref 3.5–5.0)

## 2018-02-04 LAB — LIPASE, BLOOD: Lipase: 163 U/L — ABNORMAL HIGH (ref 11–51)

## 2018-02-04 MED ORDER — ASPIRIN 81 MG PO CHEW
81.0000 mg | CHEWABLE_TABLET | Freq: Every day | ORAL | Status: DC
  Administered 2018-02-05 – 2018-02-14 (×10): 81 mg via ORAL
  Filled 2018-02-04 (×10): qty 1

## 2018-02-04 MED ORDER — PROMETHAZINE HCL 25 MG/ML IJ SOLN
12.5000 mg | Freq: Once | INTRAMUSCULAR | Status: AC
Start: 2018-02-04 — End: 2018-02-04
  Administered 2018-02-04: 12.5 mg via INTRAVENOUS
  Filled 2018-02-04: qty 1

## 2018-02-04 MED ORDER — LORAZEPAM 2 MG/ML IJ SOLN
2.0000 mg | INTRAMUSCULAR | Status: DC | PRN
  Administered 2018-02-04: 2 mg via INTRAVENOUS
  Filled 2018-02-04: qty 1

## 2018-02-04 MED ORDER — PANTOPRAZOLE SODIUM 40 MG PO TBEC
40.0000 mg | DELAYED_RELEASE_TABLET | Freq: Every day | ORAL | Status: DC
Start: 2018-02-04 — End: 2018-02-08
  Administered 2018-02-05 – 2018-02-07 (×3): 40 mg via ORAL
  Filled 2018-02-04 (×3): qty 1

## 2018-02-04 MED ORDER — SODIUM CHLORIDE 0.9 % IV SOLN
3.0000 g | Freq: Four times a day (QID) | INTRAVENOUS | Status: AC
  Administered 2018-02-04 – 2018-02-10 (×26): 3 g via INTRAVENOUS
  Filled 2018-02-04 (×28): qty 3

## 2018-02-04 MED ORDER — BISACODYL 5 MG PO TBEC
5.0000 mg | DELAYED_RELEASE_TABLET | Freq: Every day | ORAL | Status: DC | PRN
  Administered 2018-02-07 – 2018-02-10 (×2): 5 mg via ORAL
  Filled 2018-02-04 (×3): qty 1

## 2018-02-04 MED ORDER — FOLIC ACID 5 MG/ML IJ SOLN
1.0000 mg | Freq: Once | INTRAMUSCULAR | Status: AC
  Administered 2018-02-04: 1 mg via INTRAVENOUS
  Filled 2018-02-04: qty 0.2

## 2018-02-04 MED ORDER — FOLIC ACID 1 MG PO TABS
1.0000 mg | ORAL_TABLET | Freq: Every day | ORAL | Status: DC
  Administered 2018-02-05 – 2018-02-14 (×10): 1 mg via ORAL
  Filled 2018-02-04 (×10): qty 1

## 2018-02-04 MED ORDER — SODIUM CHLORIDE 0.9 % IV SOLN
INTRAVENOUS | Status: AC
  Administered 2018-02-04: 999 mL/h via INTRAVENOUS

## 2018-02-04 MED ORDER — METOPROLOL TARTRATE 5 MG/5ML IV SOLN
2.5000 mg | INTRAVENOUS | Status: AC
  Administered 2018-02-04: 2.5 mg via INTRAVENOUS

## 2018-02-04 MED ORDER — POTASSIUM CHLORIDE CRYS ER 20 MEQ PO TBCR
20.0000 meq | EXTENDED_RELEASE_TABLET | ORAL | Status: AC
  Administered 2018-02-04 – 2018-02-05 (×2): 20 meq via ORAL
  Filled 2018-02-04 (×2): qty 1

## 2018-02-04 MED ORDER — SODIUM CHLORIDE 0.9 % IV SOLN
INTRAVENOUS | Status: DC
  Administered 2018-02-04: 26.3 [IU]/h via INTRAVENOUS
  Administered 2018-02-04: 17.9 [IU]/h via INTRAVENOUS
  Filled 2018-02-04 (×2): qty 1

## 2018-02-04 MED ORDER — ONDANSETRON HCL 4 MG/2ML IJ SOLN
INTRAMUSCULAR | Status: AC
  Administered 2018-02-04: 4 mg via INTRAVENOUS
  Filled 2018-02-04: qty 2

## 2018-02-04 MED ORDER — PROMETHAZINE HCL 25 MG/ML IJ SOLN
12.5000 mg | Freq: Once | INTRAMUSCULAR | Status: AC
  Administered 2018-02-04: 12.5 mg via INTRAVENOUS

## 2018-02-04 MED ORDER — MORPHINE SULFATE (PF) 2 MG/ML IV SOLN
1.0000 mg | INTRAVENOUS | Status: DC | PRN
  Administered 2018-02-04 (×2): 1 mg via INTRAVENOUS
  Filled 2018-02-04 (×2): qty 1

## 2018-02-04 MED ORDER — ADULT MULTIVITAMIN W/MINERALS CH
1.0000 | ORAL_TABLET | Freq: Every day | ORAL | Status: DC
  Administered 2018-02-05 – 2018-02-14 (×10): 1 via ORAL
  Filled 2018-02-04 (×12): qty 1

## 2018-02-04 MED ORDER — IPRATROPIUM-ALBUTEROL 0.5-2.5 (3) MG/3ML IN SOLN
3.0000 mL | Freq: Four times a day (QID) | RESPIRATORY_TRACT | Status: DC
  Administered 2018-02-04 – 2018-02-08 (×16): 3 mL via RESPIRATORY_TRACT
  Filled 2018-02-04 (×16): qty 3

## 2018-02-04 MED ORDER — SODIUM BICARBONATE 8.4 % IV SOLN
100.0000 meq | Freq: Once | INTRAVENOUS | Status: AC
  Administered 2018-02-04: 100 meq via INTRAVENOUS
  Filled 2018-02-04: qty 100

## 2018-02-04 MED ORDER — HEPARIN SODIUM (PORCINE) 5000 UNIT/ML IJ SOLN
5000.0000 [IU] | Freq: Three times a day (TID) | INTRAMUSCULAR | Status: DC
  Administered 2018-02-04 – 2018-02-10 (×18): 5000 [IU] via SUBCUTANEOUS
  Filled 2018-02-04 (×17): qty 1

## 2018-02-04 MED ORDER — HYDROCODONE-ACETAMINOPHEN 5-325 MG PO TABS
1.0000 | ORAL_TABLET | ORAL | Status: DC | PRN
  Administered 2018-02-09 – 2018-02-11 (×2): 1 via ORAL
  Filled 2018-02-04 (×2): qty 1

## 2018-02-04 MED ORDER — LORAZEPAM 1 MG PO TABS: 1.0000 mg | ORAL_TABLET | Freq: Four times a day (QID) | ORAL | Status: AC | PRN

## 2018-02-04 MED ORDER — INSULIN ASPART 100 UNIT/ML ~~LOC~~ SOLN
0.0000 [IU] | SUBCUTANEOUS | Status: DC
  Administered 2018-02-05: 3 [IU] via SUBCUTANEOUS
  Administered 2018-02-05: 5 [IU] via SUBCUTANEOUS
  Administered 2018-02-05 (×2): 2 [IU] via SUBCUTANEOUS
  Administered 2018-02-05 (×2): 5 [IU] via SUBCUTANEOUS
  Administered 2018-02-06 (×2): 3 [IU] via SUBCUTANEOUS
  Administered 2018-02-06: 5 [IU] via SUBCUTANEOUS
  Administered 2018-02-06: 2 [IU] via SUBCUTANEOUS
  Administered 2018-02-06 – 2018-02-07 (×3): 3 [IU] via SUBCUTANEOUS
  Administered 2018-02-07: 5 [IU] via SUBCUTANEOUS
  Administered 2018-02-07 – 2018-02-08 (×2): 2 [IU] via SUBCUTANEOUS
  Filled 2018-02-04 (×14): qty 1

## 2018-02-04 MED ORDER — ACETAMINOPHEN 325 MG PO TABS
650.0000 mg | ORAL_TABLET | Freq: Four times a day (QID) | ORAL | Status: DC | PRN
  Administered 2018-02-05 – 2018-02-14 (×6): 650 mg via ORAL
  Filled 2018-02-04 (×6): qty 2

## 2018-02-04 MED ORDER — SODIUM CHLORIDE 0.9 % IV BOLUS
1000.0000 mL | Freq: Once | INTRAVENOUS | Status: AC
  Administered 2018-02-04: 1000 mL via INTRAVENOUS

## 2018-02-04 MED ORDER — ACETAMINOPHEN 650 MG RE SUPP: 650.0000 mg | Freq: Four times a day (QID) | RECTAL | Status: DC | PRN

## 2018-02-04 MED ORDER — NICOTINE 14 MG/24HR TD PT24
14.0000 mg | MEDICATED_PATCH | Freq: Every day | TRANSDERMAL | Status: DC
  Administered 2018-02-04 – 2018-02-14 (×11): 14 mg via TRANSDERMAL
  Filled 2018-02-04 (×12): qty 1

## 2018-02-04 MED ORDER — INSULIN GLARGINE 100 UNIT/ML ~~LOC~~ SOLN
35.0000 [IU] | Freq: Every day | SUBCUTANEOUS | Status: DC
  Administered 2018-02-04: 35 [IU] via SUBCUTANEOUS
  Filled 2018-02-04 (×2): qty 0.35

## 2018-02-04 MED ORDER — THIAMINE HCL 100 MG/ML IJ SOLN
100.0000 mg | Freq: Once | INTRAMUSCULAR | Status: AC
Start: 2018-02-04 — End: 2018-02-04
  Administered 2018-02-04: 100 mg via INTRAVENOUS
  Filled 2018-02-04: qty 2

## 2018-02-04 MED ORDER — ONDANSETRON HCL 4 MG PO TABS
4.0000 mg | ORAL_TABLET | Freq: Four times a day (QID) | ORAL | Status: DC | PRN
  Administered 2018-02-06: 4 mg via ORAL
  Filled 2018-02-04: qty 1

## 2018-02-04 MED ORDER — LORAZEPAM 2 MG/ML IJ SOLN: 1.0000 mg | Freq: Four times a day (QID) | INTRAMUSCULAR | Status: AC | PRN

## 2018-02-04 MED ORDER — VITAMIN B-1 100 MG PO TABS
100.0000 mg | ORAL_TABLET | Freq: Every day | ORAL | Status: DC
  Administered 2018-02-05 – 2018-02-14 (×10): 100 mg via ORAL
  Filled 2018-02-04 (×11): qty 1

## 2018-02-04 MED ORDER — DOCUSATE SODIUM 100 MG PO CAPS
100.0000 mg | ORAL_CAPSULE | Freq: Two times a day (BID) | ORAL | Status: DC
  Administered 2018-02-05 – 2018-02-14 (×18): 100 mg via ORAL
  Filled 2018-02-04 (×19): qty 1

## 2018-02-04 MED ORDER — SODIUM CHLORIDE 0.9 % IV SOLN
3.0000 g | Freq: Two times a day (BID) | INTRAVENOUS | Status: DC
  Administered 2018-02-04: 3 g via INTRAVENOUS
  Filled 2018-02-04 (×2): qty 3

## 2018-02-04 MED ORDER — ONDANSETRON HCL 4 MG/2ML IJ SOLN
4.0000 mg | Freq: Four times a day (QID) | INTRAMUSCULAR | Status: DC | PRN
  Administered 2018-02-04 – 2018-02-08 (×5): 4 mg via INTRAVENOUS
  Filled 2018-02-04 (×4): qty 2

## 2018-02-04 MED ORDER — DEXTROSE-NACL 5-0.45 % IV SOLN
INTRAVENOUS | Status: DC
  Administered 2018-02-04: 14:00:00 via INTRAVENOUS

## 2018-02-04 MED ORDER — SODIUM CHLORIDE 0.9 % IV SOLN
INTRAVENOUS | Status: DC
  Administered 2018-02-04 (×2): via INTRAVENOUS

## 2018-02-04 MED ORDER — FUROSEMIDE 10 MG/ML IJ SOLN
40.0000 mg | INTRAMUSCULAR | Status: AC
  Administered 2018-02-04: 40 mg via INTRAVENOUS
  Filled 2018-02-04: qty 4

## 2018-02-04 MED ORDER — METOPROLOL TARTRATE 5 MG/5ML IV SOLN
INTRAVENOUS | Status: AC
  Administered 2018-02-04: 2.5 mg via INTRAVENOUS
  Filled 2018-02-04: qty 5

## 2018-02-04 MED ORDER — TRAZODONE HCL 50 MG PO TABS
25.0000 mg | ORAL_TABLET | Freq: Every evening | ORAL | Status: DC | PRN
  Administered 2018-02-13: 25 mg via ORAL
  Filled 2018-02-04: qty 1

## 2018-02-04 MED ORDER — FAMOTIDINE IN NACL 20-0.9 MG/50ML-% IV SOLN
20.0000 mg | Freq: Every day | INTRAVENOUS | Status: DC
  Administered 2018-02-04: 20 mg via INTRAVENOUS
  Filled 2018-02-04 (×2): qty 50

## 2018-02-04 NOTE — Consult Note (Signed)
Name: Michaela Johnson MRN: [id number] DOB: 1960/12/25    ADMISSION DATE:  02/03/2018 CONSULTATION DATE: 02/04/2018  REFERRING MD : Dr. Duane Boston   CHIEF COMPLAINT: Weakness and Emesis  BRIEF PATIENT DESCRIPTION:  57 yo female admitted with hyperkalemia and acute encephalopathy secondary to DKA requiring insulin gtt   SIGNIFICANT EVENTS/STUDIES:  07/17 Pt admitted to ICU with DKA   HISTORY OF PRESENT ILLNESS:   This is a 57 yo female with a PMH of Tobacco Abuse, Diabetes Mellitus, HTN, GERD, Diabetes Mellitus, Depression, ETOH Abuse, and Neuropathy.  She presented to [facility] ER on 07/18 via EMS from home with nausea, vomiting, weakness, and lethargy onset of symptoms 3 days prior to presentation.  Per ER notes EMS notified by pts husband upon their arrival at pts home CBG 585, and she c/o generalized pain and weakness.  In the ER she was alert and oriented.  Lab results ruled pt in for DKA, therefore DKA protocol initiated.  It is uncertain if the pt has been compliant with taking her insulin.  Pt with hyperkalemia K+ 7.2 resulting in EKG changes-wide QRS and peaked T waves, therefore she received 1g calcium gluconate and albuterol treatment.  She has had similar EKG changes in the past during previous admissions due to DKA.  She was subsequently admitted to ICU by hospitalist team for further workup and treatment.   PAST MEDICAL HISTORY :   has a past medical history of Alcohol use, Depression, Diabetes mellitus without complication ([place]), GERD (gastroesophageal reflux disease), Hypertension, Neuropathy, and Tobacco use.  has a past surgical history that includes No past surgeries. Prior to Admission medications   Medication Sig Start Date End Date Taking? Authorizing Provider  aspirin 81 MG chewable tablet Chew 1 tablet (81 mg total) by mouth daily. 01/31/18  Yes Demetrios Loll, MD  cephALEXin (KEFLEX) 500 MG capsule Take 1 capsule (500 mg total) by mouth every 12 (twelve) hours. 02/01/18  Yes Demetrios Loll, MD  Cholecalciferol (VITAMIN D3) 400 units CAPS Take 1 capsule by mouth daily.   Yes [provider]  diltiazem (CARDIZEM) 90 MG tablet Take 1 tablet (90 mg total) by mouth every 12 (twelve) hours. 01/31/18  Yes Demetrios Loll, MD  FLUoxetine (PROZAC) 40 MG capsule Take 40 mg by mouth daily.   Yes [provider]  insulin glargine (LANTUS) 100 UNIT/ML injection Inject 50 Units into the skin daily.   Yes [provider]  lisinopril (PRINIVIL,ZESTRIL) 40 MG tablet Take 40 mg by mouth daily.   Yes [provider]  Multiple Vitamin (MULTI-VITAMINS) TABS Take 1 tablet by mouth daily.   Yes [provider]  Omega-3 Fatty Acids (FISH OIL) 1000 MG CAPS Take 1 tablet by mouth daily.   Yes [provider]  omeprazole (PRILOSEC) 20 MG capsule Take 1 capsule by mouth daily.   Yes [provider]   No Known Allergies  FAMILY HISTORY:  family history includes CAD in her father; Diabetes in her mother. SOCIAL HISTORY:  reports that she has been smoking.  She has a 41.00 pack-year smoking history. She has never used smokeless tobacco. She reports that she drinks alcohol. She reports that she has current or past drug history. Drug: Marijuana.  REVIEW OF SYSTEMS: Positives in BOLD  Constitutional: Negative for fever, chills, weight loss, malaise/fatigue and diaphoresis.  HENT: Negative for hearing loss, ear pain, nosebleeds, congestion, sore throat, neck pain, tinnitus and ear discharge.   Eyes: Negative for blurred vision, double vision, photophobia, pain,  discharge and redness.  Respiratory: Negative for cough, hemoptysis, sputum production, shortness of breath, wheezing and stridor.   Cardiovascular: Negative for chest pain, palpitations, orthopnea, claudication, leg swelling and PND.  Gastrointestinal: heartburn, nausea, vomiting, abdominal pain, diarrhea, constipation, blood in stool and melena.  Genitourinary: Negative for dysuria, urgency,  frequency, hematuria and flank pain.  Musculoskeletal: generalized pain, myalgias, back pain, joint pain and falls.  Skin: Negative for itching and rash.  Neurological: lethargy, dizziness, tingling, tremors, sensory change, speech change, focal weakness, seizures, loss of consciousness, weakness and headaches.  Endo/Heme/Allergies: Negative for environmental allergies and polydipsia. Does not bruise/bleed easily.  SUBJECTIVE:  c/o thirst   VITAL SIGNS: Pulse Rate:  [87-92] 92 (07/18 0000) Resp:  [23-26] 25 (07/18 0000) BP: (93-111)/(51-62) 111/62 (07/18 0000) SpO2:  [99 %-100 %] 99 % (07/18 0000) Weight:  [70.3 kg (155 lb)] 70.3 kg (155 lb) (07/17 2158)  PHYSICAL EXAMINATION: General: well developed female resting in bed, NAD  Neuro: alert and oriented, follows commands, PERRLA HEENT: supple, no JVD Cardiovascular: nsr, rrr, no R/G  Lungs: clear throughout, even, non labored  Abdomen: +BS x4, soft, non distended, non tender  Musculoskeletal: normal bulk and tone, no edema  Skin: bilateral feet abrasions no drainage or odor present dressings dry and intact   Recent Labs  Lab 01/30/18 0531 01/31/18 0600 02/03/18 2219  NA 135 138 117*  K 4.6 3.5 7.2*  CL 104 102 76*  CO2 16* 29 <7*  BUN 39* 13 41*  CREATININE 1.19* 0.67 1.96*  GLUCOSE 253* 192* 958*   Recent Labs  Lab 01/30/18 0722 01/31/18 0727 02/03/18 2219  HGB 11.3* 12.0 11.7*  HCT 33.0* 34.2* 38.7  WBC 13.9* 6.9 23.6*  PLT 277 254 367   No results found.  ASSESSMENT / PLAN: Diabetic Ketoacidosis  Acute renal failure  Hyperkalemia with pseudohyponatremia in setting of DKA  Elevated troponin likely demand ischemia secondary to DKA  Nausea/Vomiting  Acute encephalopathy likely secondary to DKA  Hx: ETOH and Tobacco Abuse, HTN, Depression, and GERD  P: Supplemental O2 for dyspnea and/or hypoxia  Continuous telemetry monitoring Repeat EKG   Continue DKA protocol until anion gap closed and serum CO2  >20 Diabetes Coordinator consulted appreciate input  Trend BMP  Replace electrolytes as indicated  Monitor UOP  Avoid nephrotoxic medications VTE px: subq heparin  Trend CBC  Monitor for s/sx of bleeding and transfuse for hgb <7 Continue po protonix  CIWA protocol  Once mentation improves will need ETOH abuse cessation counseling  Wound Care consulted appreciate input   Marda Stalker, [place] Pager [phone] (please enter 7 digits) PCCM Consult Pager [phone] (please enter 7 digits)

## 2018-02-04 NOTE — Progress Notes (Signed)
Pharmacy Antibiotic Note  Michaela Johnson is a 57 y.o. female admitted on 02/03/2018 with DKA.  Pharmacy has been consulted for Unasyn dosing for possible aspiration pneumonia.  Plan: Continue Unasyn 3g IV Q6hr.    Height: 5\' 2"  (157.5 cm) Weight: 150 lb 5.7 oz (68.2 kg) IBW/kg (Calculated) : 50.1  Temp (24hrs), Avg:97.8 F (36.6 C), Min:97.4 F (36.3 C), Max:98.2 F (36.8 C)  Recent Labs  Lab 01/29/18 0616 01/29/18 0813 01/29/18 1018  01/30/18 0722  01/31/18 0727 02/03/18 2219 02/04/18 0037 02/04/18 0448 02/04/18 0757 02/04/18 1200  WBC 14.1*  --   --   --  13.9*  --  6.9 23.6*  --  24.4*  --   --   CREATININE 1.54*  --  1.67*   < >  --    < >  --  1.96* 2.03* 2.02* 1.95* 1.48*  LATICACIDVEN  --  3.7* 4.5*  --   --   --  1.0  --   --   --   --   --    < > = values in this interval not displayed.    Estimated Creatinine Clearance: 37.5 mL/min (A) (by C-G formula based on SCr of 1.48 mg/dL (H)).    No Known Allergies  Antimicrobials this admission: Unasyn 7/18 >>   Dose adjustments this admission: N/A  Microbiology results: 7/18 BCx: no growth < 12 hours  7/18 MRSA PCR: negative   Thank you for allowing pharmacy to be a part of this patient's care.  Simpson,Michael L 02/04/2018 1:38 PM

## 2018-02-04 NOTE — Progress Notes (Signed)
Inpatient Diabetes Program Recommendations  AACE/ADA: New Consensus Statement on Inpatient Glycemic Control (2019)  Target Ranges:  Prepandial:   less than 140 mg/dL      Peak postprandial:   less than 180 mg/dL (1-2 hours)      Critically ill patients:  140 - 180 mg/dL   Results for Michaela Johnson, Michaela Johnson (MRN [id number]) as of 02/04/2018 07:40  Ref. Range 02/04/2018 03:24 02/04/2018 04:19 02/04/2018 05:28 02/04/2018 06:22 02/04/2018 07:21  Glucose-Capillary Latest Ref Range: 70 - 99 mg/dL >600 (HH) >600 (HH) 586 (HH) 497 (H) 458 (H)  Results for [name], [name] (MRN [id number]) as of 02/04/2018 07:40  Ref. Range 02/03/2018 22:19  Glucose Latest Ref Range: 70 - 99 mg/dL [facility])  Results for [name], [name] (MRN [id number]) as of 02/04/2018 07:40  Ref. Range 01/29/2018 06:17  Hemoglobin A1C Latest Ref Range: 4.8 - 5.6 % 7.3 (H)   Review of Glycemic Control  Outpatient Diabetes medications: Lantus 50 units QHS Current orders for Inpatient glycemic control: IV insulin drip  Inpatient Diabetes Program Recommendations:  Insulin - IV drip/GlucoStabilizer: Patient remains acidotic at this time and will continue to require IV insulin drip per GlucoStabilizer.  IV insulin should be continued until acidosis is cleared (CO2 >20, AG <10-12) as determined by MD.  Insulin - Basal: Once acidosis is cleared and MD is ready to transition from IV to SQ insulin, please consider ordering Lantus 25 units Q24H. Correction (SSI): Once acidosis is cleared and MD is ready to transition from IV to SQ insulin, please consider ordering Novolog 0-9 units Q4H. Insulin - Meal Coverage: Once acidosis is cleared and MD is ready to transition from IV to SQ insulin,  once diet is ordered please consider ordering Novolog 4 units TID with meals for meal coverage if patient eats at least 50% of meals. Outpatient DM medications: At time of discharge, please consider prescribing Novolog insulin meal coverage and correction.  NOTE: Diabetes  Coordinator spoke with patient on 01/29/18 during last hospital admission. Patient reported that she is followed by [facility] and gets all needed insulin and testing supplies at the clinic pharmacy. Patient was recently told at the clinic that she could stop taking Novolog with meals. Anticipate patient will require basal insulin as well as Novolog meal coverage and correction scale for DM control. At time of discharge, please consider prescribing Novolog insulin meal coverage and correction (in addition to basal insulin). Will continue to follow while inpatient.  Thanks, Barnie Alderman, RN, MSN, CDE Diabetes Coordinator Inpatient Diabetes Program [phone] (Team Pager from 8am to 5pm)

## 2018-02-04 NOTE — Progress Notes (Signed)
Notified RRT  SPO2 in the 70's. Pt denies SOB at this time. PT on Twin 5L. RRT reported to bedside. SPO2 increased to 91%. Will continue to monitor.

## 2018-02-04 NOTE — Progress Notes (Signed)
Bladder scan completed and results showed 331ml.

## 2018-02-04 NOTE — Progress Notes (Signed)
Pharmacy Antibiotic Note  Michaela Johnson is a 57 y.o. female admitted on 02/03/2018 with aspiration pneumonia.  Pharmacy has been consulted for unasyn dosing.  Plan: Will start patient on Unasyn 3g IV q12h per CrCl 15 - 29 ml/min  Height: 5\' 2"  (157.5 cm) Weight: 150 lb 5.7 oz (68.2 kg) IBW/kg (Calculated) : 50.1  Temp (24hrs), Avg:97.8 F (36.6 C), Min:97.4 F (36.3 C), Max:98.2 F (36.8 C)  Recent Labs  Lab 01/29/18 0616 01/29/18 0813 01/29/18 1018  01/29/18 1927 01/30/18 0531 01/30/18 0722 01/31/18 0600 01/31/18 0727 02/03/18 2219 02/04/18 0037 02/04/18 0448  WBC 14.1*  --   --   --   --   --  13.9*  --  6.9 23.6*  --  24.4*  CREATININE 1.54*  --  1.67*   < > 1.18* 1.19*  --  0.67  --  1.96* 2.03*  --   LATICACIDVEN  --  3.7* 4.5*  --   --   --   --   --  1.0  --   --   --    < > = values in this interval not displayed.    Estimated Creatinine Clearance: 27.3 mL/min (A) (by C-G formula based on SCr of 2.03 mg/dL (H)).    No Known Allergies   Thank you for allowing pharmacy to be a part of this patient's care.  Tobie Lords, PharmD, BCPS Clinical Pharmacist 02/04/2018

## 2018-02-04 NOTE — Progress Notes (Signed)
[facility] [place] NAME: Michaela Johnson    MR#:  [id number]  DATE OF BIRTH:  [birth year]  SUBJECTIVE:  CHIEF COMPLAINT:   Chief Complaint  Patient presents with  . Weakness  . Emesis  Patient seen and evaluated today Still has some nausea and vomitings Abdominal pain On insulin drip for control of blood sugars  REVIEW OF SYSTEMS:    ROS  CONSTITUTIONAL: No documented fever. No fatigue, weakness. No weight gain, no weight loss.  EYES: No blurry or double vision.  ENT: No tinnitus. No postnasal drip. No redness of the oropharynx.  RESPIRATORY: No cough, no wheeze, no hemoptysis. No dyspnea.  CARDIOVASCULAR: No chest pain. No orthopnea. No palpitations. No syncope.  GASTROINTESTINAL: Has nausea, Has vomiting no diarrhea. Mild abdominal pain. No melena or hematochezia.  GENITOURINARY: No dysuria or hematuria.  ENDOCRINE: No polyuria or nocturia. No heat or cold intolerance.  HEMATOLOGY: No anemia. No bruising. No bleeding.  INTEGUMENTARY: No rashes. No lesions.  MUSCULOSKELETAL: No arthritis. No swelling. No gout.  NEUROLOGIC: No numbness, tingling, or ataxia. No seizure-type activity.  PSYCHIATRIC: No anxiety. No insomnia. No ADD.   DRUG ALLERGIES:  No Known Allergies  VITALS:  Blood pressure 102/87, pulse 87, temperature 98.1 F (36.7 C), temperature source Axillary, resp. rate (!) 26, height 5\' 2"  (1.575 m), weight 68.2 kg (150 lb 5.7 oz), SpO2 93 %.  PHYSICAL EXAMINATION:   Physical Exam  GENERAL:  57 y.o.-year-old patient lying in the bed with no acute distress.  EYES: Pupils equal, round, reactive to light and accommodation. No scleral icterus. Extraocular muscles intact.  HEENT: Head atraumatic, normocephalic. Oropharynx and nasopharynx clear.  NECK:  Supple, no jugular venous distention. No thyroid enlargement, no tenderness.  LUNGS: Normal breath sounds bilaterally, no wheezing, rales, rhonchi. No use of accessory  muscles of respiration.  CARDIOVASCULAR: S1, S2 normal. No murmurs, rubs, or gallops.  ABDOMEN: Soft, mild tenderness around umbilicus, nondistended. Bowel sounds present. No organomegaly or mass.  EXTREMITIES: No cyanosis, clubbing or edema b/l.    NEUROLOGIC: Cranial nerves II through XII are intact. No focal Motor or sensory deficits b/l.   PSYCHIATRIC: The patient is alert and oriented x 3.  SKIN: No obvious rash, lesion, or ulcer.   LABORATORY PANEL:   CBC Recent Labs  Lab 02/04/18 0448  WBC 24.4*  HGB 10.4*  HCT 33.0*  PLT 332   ------------------------------------------------------------------------------------------------------------------ Chemistries  Recent Labs  Lab 01/29/18 0616  02/04/18 1200  NA 123*   < > 134*  K 7.1*   < > 3.8  CL 87*   < > 99  CO2 8*   < > 20*  GLUCOSE 835*   < > 303*  BUN 46*   < > 37*  CREATININE 1.54*   < > 1.48*  CALCIUM 9.0   < > 7.5*  MG  --    < > 2.3  AST 37  --   --   ALT 36  --   --   ALKPHOS 113  --   --   BILITOT 1.5*  --   --    < > = values in this interval not displayed.   ------------------------------------------------------------------------------------------------------------------  Cardiac Enzymes Recent Labs  Lab 02/04/18 0757  TROPONINI 0.12*   ------------------------------------------------------------------------------------------------------------------  RADIOLOGY:  Dg Abd 1 View  Result Date: 02/04/2018 CLINICAL DATA:  Nausea, history of diabetes, depression, hypertension EXAM: ABDOMEN - 1 VIEW COMPARISON:  CT abdomen pelvis of  06/07/2016 FINDINGS: A supine view of the abdomen shows no bowel obstruction. There is a moderate of feces throughout the colon. No opaque calculi are seen. May be opacity at the right lung base and pneumonia cannot be excluded. Correlation with chest x-ray is recommended. A sclerotic area overlying the right superior acetabulum most likely represents a benign bone island.  IMPRESSION: 1. No bowel obstruction. Moderate amount of feces throughout the colon. 2. Opacity overlying the right lung base could represent pneumonia. Correlate clinically. Electronically Signed   By: Ivar Drape M.D.   On: 02/04/2018 12:58   Dg Chest Port 1 View  Result Date: 02/04/2018 CLINICAL DATA:  Leukocytosis EXAM: PORTABLE CHEST 1 VIEW COMPARISON:  Portable exam 0453 hours compared to 04/15/2017 FINDINGS: Enlargement of cardiac silhouette. Mediastinal contours and pulmonary vascularity normal. Atherosclerotic calcification aorta. Minimal chronic accentuation of pulmonary markings, stable Lungs otherwise clear. No acute infiltrate, pleural effusion or pneumothorax. Bones demineralized. IMPRESSION: Enlargement of cardiac silhouette. No acute abnormalities. Electronically Signed   By: Lavonia Dana M.D.   On: 02/04/2018 09:47     ASSESSMENT AND PLAN:   57 year old female patient with history of diabetes mellitus, alcohol abuse, GERD, hypertension, tobacco abuse currently in stepdown unit for diabetic ketoacidosis.  -Diabetic ketoacidosis resolving slowly IV insulin drip Monitor blood sugars closely IV fluids  -Metabolic acidosis secondary to diabetic ketoacidosis resolving slowly Monitor anion gap  -Tobacco abuse Tobacco cessation counseled to the patient for 6 minutes Nicotine patch offered  -Hyperkalemia improved  -Elevated troponin secondary to demand ischemia  -Acute kidney injury secondary to dehydration improved with IV fluids  All the records are reviewed and case discussed with Care Management/Social Worker. Management plans discussed with the patient, family and they are in agreement.  CODE STATUS: Full code  DVT Prophylaxis: SCDs  TOTAL TIME TAKING CARE OF THIS PATIENT: 35 minutes.   POSSIBLE D/C IN 2 to 3 DAYS, DEPENDING ON CLINICAL CONDITION.  Saundra Shelling M.D on 02/04/2018 at 1:46 PM  Between 7am to 6pm - Pager - [phone]  After 6pm go to  [email or web address] - password [email or web address] [facility]  [facility]  Office  [phone]  CC: Primary care physician; [name], NP  Note: This dictation was prepared with Dragon dictation along with smaller phrase technology. Any transcriptional errors that result from this process are unintentional.

## 2018-02-04 NOTE — Progress Notes (Signed)
RN spoke with Dr. Manuella Ghazi and made MD aware that patient was bladder scanned and result was 1000 mL and that patient continues to complain of nausea even after zofran was given.  MD gave order for in and out cath and phenergan 12.5 mg IV once.

## 2018-02-04 NOTE — Progress Notes (Signed)
Patient is anion gap has been closed.  Patient takes 50 units of Lantus at home, as patient is not able to eat much we will start the patient on Lantus 35 units, start on sliding scale coverage, start clear liquid diet, stop insulin drip, follow-up with BMP and chemistry  Omaya Nieland [facility]

## 2018-02-04 NOTE — Progress Notes (Signed)
Pt experiencing N/V, small amounts of clear emesis and tremors.Gave ativan per CIWA  protocol see MAR. Pt stated that she drinks a 6 pack or more of beer daily.

## 2018-02-04 NOTE — Consult Note (Signed)
[facility]  CONSULT NOTE    Date: 02/04/2018                  Patient Name:  Michaela Johnson  MRN: [id number]  DOB: Feb 09, 1961  Age / Sex: 57 y.o., female         PCP: [name], NP                 Service Requesting Consult: Dr. Manuella Ghazi                 Reason for Consult: Acute renal failure            History of Present Illness: Michaela Johnson is a 57 y.o. white female with diabetes mellitus type II insulin dependent, hypertension, diabetic neuropathy, atrial fibrillationGERD, depression, tobacco use, alcohol abuse who was admitted to [facility] on 02/03/2018 for Hyperkalemia [E87.5] Diabetic ketoacidosis without coma associated with type 1 diabetes mellitus ([place]) [E10.10]  Patient found to be weak with nausea and vomiting. Found to be in anion gap acidosis with hyperglycemia.   Patient was recently hospitalized from 7/12 to 7/14 for similar presentation.    Medications: Outpatient medications: Medications Prior to Admission  Medication Sig Dispense Refill Last Dose  . aspirin 81 MG chewable tablet Chew 1 tablet (81 mg total) by mouth daily. 30 tablet 1   . cephALEXin (KEFLEX) 500 MG capsule Take 1 capsule (500 mg total) by mouth every 12 (twelve) hours. 6 capsule 0   . Cholecalciferol (VITAMIN D3) 400 units CAPS Take 1 capsule by mouth daily.   Past Week at Unknown time  . diltiazem (CARDIZEM) 90 MG tablet Take 1 tablet (90 mg total) by mouth every 12 (twelve) hours. 60 tablet 0   . FLUoxetine (PROZAC) 40 MG capsule Take 40 mg by mouth daily.   Past Week at Unknown time  . insulin glargine (LANTUS) 100 UNIT/ML injection Inject 50 Units into the skin daily.   01/28/2018 at Unknown time  . lisinopril (PRINIVIL,ZESTRIL) 40 MG tablet Take 40 mg by mouth daily.   Past Week at Unknown time  . Multiple Vitamin (MULTI-VITAMINS) TABS Take 1 tablet by mouth daily.   Past Week at Unknown time  . Omega-3 Fatty Acids (FISH OIL) 1000 MG CAPS Take 1 tablet by mouth  daily.   Past Week at Unknown time  . omeprazole (PRILOSEC) 20 MG capsule Take 1 capsule by mouth daily.   Past Week at Unknown time    Current medications: Current Facility-Administered Medications  Medication Dose Route Frequency Provider Last Rate Last Dose  . 0.9 %  sodium chloride infusion   Intravenous Continuous Lahoma Rocker, MD 100 mL/hr at 02/04/18 1022    . acetaminophen (TYLENOL) tablet 650 mg  650 mg Oral Q6H PRN Amelia Jo, MD       Or  . acetaminophen (TYLENOL) suppository 650 mg  650 mg Rectal Q6H PRN Amelia Jo, MD      . Ampicillin-Sulbactam (UNASYN) 3 g in sodium chloride 0.9 % 100 mL IVPB  3 g Intravenous Q12H Awilda Bill, NP   Stopped at 02/04/18 [phone]  . aspirin chewable tablet 81 mg  81 mg Oral Daily Lahoma Rocker, MD      . bisacodyl (DULCOLAX) EC tablet 5 mg  5 mg Oral Daily PRN Amelia Jo, MD      . dextrose 5 %-0.45 % sodium chloride infusion   Intravenous Continuous Amelia Jo, MD   Stopped at 02/04/18 [phone]  .  docusate sodium (COLACE) capsule 100 mg  100 mg Oral BID Amelia Jo, MD      . folic acid (FOLVITE) tablet 1 mg  1 mg Oral Daily Awilda Bill, NP      . heparin injection 5,000 Units  5,000 Units Subcutaneous Q8H Amelia Jo, MD   5,000 Units at 02/04/18 0040  . HYDROcodone-acetaminophen (NORCO/VICODIN) 5-325 MG per tablet 1-2 tablet  1-2 tablet Oral Q4H PRN Amelia Jo, MD      . insulin regular (NOVOLIN R,HUMULIN R) 100 Units in sodium chloride 0.9 % 100 mL (1 Units/mL) infusion   Intravenous Continuous Amelia Jo, MD 17.9 mL/hr at 02/04/18 1034 17.9 Units/hr at 02/04/18 1034  . ipratropium-albuterol (DUONEB) 0.5-2.5 (3) MG/3ML nebulizer solution 3 mL  3 mL Nebulization Q6H Shah, Rutul, MD      . LORazepam (ATIVAN) injection 2-3 mg  2-3 mg Intravenous Q1H PRN Awilda Bill, NP      . morphine 2 MG/ML injection 1 mg  1 mg Intravenous Q4H PRN Awilda Bill, NP   1 mg at 02/04/18 8546  . multivitamin with minerals tablet 1 tablet   1 tablet Oral Daily Awilda Bill, NP      . nicotine (NICODERM CQ - dosed in mg/24 hours) patch 14 mg  14 mg Transdermal Daily Lahoma Rocker, MD   14 mg at 02/04/18 1100  . ondansetron (ZOFRAN) tablet 4 mg  4 mg Oral Q6H PRN Amelia Jo, MD       Or  . ondansetron [facility]) injection 4 mg  4 mg Intravenous Q6H PRN Amelia Jo, MD   4 mg at 02/04/18 0945  . pantoprazole (PROTONIX) EC tablet 40 mg  40 mg Oral Daily Amelia Jo, MD      . thiamine (VITAMIN B-1) tablet 100 mg  100 mg Oral Daily Awilda Bill, NP      . traZODone (DESYREL) tablet 25 mg  25 mg Oral QHS PRN Amelia Jo, MD          Allergies: No Known Allergies    Past Medical History: Past Medical History:  Diagnosis Date  . Alcohol use   . Depression   . Diabetes mellitus without complication ([place])   . GERD (gastroesophageal reflux disease)   . Hypertension   . Neuropathy   . Tobacco use      Past Surgical History: Past Surgical History:  Procedure Laterality Date  . NO PAST SURGERIES       Family History: Family History  Problem Relation Age of Onset  . Diabetes Mother   . CAD Father      Social History: Social History   Socioeconomic History  . Marital status: Married    Spouse name: Not on file  . Number of children: Not on file  . Years of education: Not on file  . Highest education level: Not on file  Occupational History  . Not on file  Social Needs  . Financial resource strain: Not on file  . Food insecurity:    Worry: Not on file    Inability: Not on file  . Transportation needs:    Medical: Not on file    Non-medical: Not on file  Tobacco Use  . Smoking status: Current Some Day Smoker    Packs/day: 1.00    Years: 41.00    Pack years: 41.00  . Smokeless tobacco: Never Used  Substance and Sexual Activity  . Alcohol use: Yes    Comment: 42  per week  . Drug use: Yes    Types: Marijuana  . Sexual activity: Not on file  Lifestyle  . Physical activity:    Days per  week: Not on file    Minutes per session: Not on file  . Stress: Not on file  Relationships  . Social connections:    Talks on phone: Not on file    Gets together: Not on file    Attends religious service: Not on file    Active member of club or organization: Not on file    Attends meetings of clubs or organizations: Not on file    Relationship status: Not on file  . Intimate partner violence:    Fear of current or ex partner: Not on file    Emotionally abused: Not on file    Physically abused: Not on file    Forced sexual activity: Not on file  Other Topics Concern  . Not on file  Social History Narrative  . Not on file     Review of Systems: Review of Systems  Unable to perform ROS: Medical condition    Vital Signs: Blood pressure 122/65, pulse 87, temperature (!) 97.5 F (36.4 C), temperature source Axillary, resp. rate 20, height 5\' 2"  (1.575 m), weight 68.2 kg (150 lb 5.7 oz), SpO2 92 %.  Weight trends: Filed Weights   02/04/18 0025 02/04/18 0036 02/04/18 0421  Weight: 68.2 kg (150 lb 5.7 oz) 68.2 kg (150 lb 5.7 oz) 68.2 kg (150 lb 5.7 oz)    Physical Exam: General: Laying in bed  Head: Normocephalic, atraumatic. Dry oral mucosal membranes  Eyes: Anicteric, PERRL  Neck: Supple, trachea midline  Lungs:  Clear to auscultation  Heart: Regular rate and rhythm  Abdomen:  Soft, nontender,   Extremities:  no peripheral edema.  Neurologic: Nonfocal, moving all four extremities  Skin: No lesions         Lab results: Basic Metabolic Panel: Recent Labs  Lab 01/29/18 1927 01/30/18 0531  02/04/18 0037 02/04/18 0448 02/04/18 0757  NA 133* 135   < > 123* 130* 129*  K 4.2 4.6   < > 6.5* 4.2 3.9  CL 103 104   < > 86* 91* 93*  CO2 21* 16*   < > <7* 7* 13*  GLUCOSE 152* 253*   < > 909* 678* 463*  BUN 43* 39*   < > 41* 41* 39*  CREATININE 1.18* 1.19*   < > 2.03* 2.02* 1.95*  CALCIUM 8.0* 7.9*   < > 8.2* 7.7* 7.1*  MG 1.9 2.3  --   --   --   --   PHOS  --  2.1*   --   --   --   --    < > = values in this interval not displayed.    Liver Function Tests: Recent Labs  Lab 01/29/18 0616  AST 37  ALT 36  ALKPHOS 113  BILITOT 1.5*  PROT 7.5  ALBUMIN 3.5   Recent Labs  Lab 01/29/18 0616  LIPASE 24   No results for input(s): AMMONIA in the last 168 hours.  CBC: Recent Labs  Lab 01/29/18 0616 01/30/18 0722 01/31/18 0727 02/03/18 2219 02/04/18 0448  WBC 14.1* 13.9* 6.9 23.6* 24.4*  NEUTROABS  --   --   --  20.0*  --   HGB 10.8* 11.3* 12.0 11.7* 10.4*  HCT 34.8* 33.0* 34.2* 38.7 33.0*  MCV 111.3* 100.5* 99.1 117.4* 109.2*  PLT 319 277  254 367 332    Cardiac Enzymes: Recent Labs  Lab 01/29/18 0616 02/03/18 2219 02/04/18 0448 02/04/18 0757  TROPONINI <0.03 0.07* 0.05* 0.12*    BNP: Invalid input(s): POCBNP  CBG: Recent Labs  Lab 02/04/18 0622 02/04/18 0721 02/04/18 0827 02/04/18 0937 02/04/18 1027  GLUCAP 497* 458* 446* 369* 316*    Microbiology: Results for orders placed or performed during the hospital encounter of 02/03/18  MRSA PCR Screening     Status: None   Collection Time: 02/04/18 12:32 AM  Result Value Ref Range Status   MRSA by PCR NEGATIVE NEGATIVE Final    Comment:        The GeneXpert MRSA Assay (FDA approved for NASAL specimens only), is one component of a comprehensive MRSA colonization surveillance program. It is not intended to diagnose MRSA infection nor to guide or monitor treatment for MRSA infections. Performed at [facility], [address] [place], [place] [address]   CULTURE, BLOOD (ROUTINE X 2) w Reflex to ID Panel     Status: None (Preliminary result)   Collection Time: 02/04/18  5:55 AM  Result Value Ref Range Status   Specimen Description BLOOD RIGHT ANTECUBITAL  Final   Special Requests   Final    BOTTLES DRAWN AEROBIC AND ANAEROBIC Blood Culture adequate volume   Culture   Final    NO GROWTH <12 HOURS Performed at [facility], [address]  [place], [place] [address]    Report Status PENDING  Incomplete  CULTURE, BLOOD (ROUTINE X 2) w Reflex to ID Panel     Status: None (Preliminary result)   Collection Time: 02/04/18  6:01 AM  Result Value Ref Range Status   Specimen Description BLOOD BLOOD RIGHT HAND  Final   Special Requests   Final    BOTTLES DRAWN AEROBIC AND ANAEROBIC Blood Culture results may not be optimal due to an inadequate volume of blood received in culture bottles   Culture   Final    NO GROWTH <12 HOURS Performed at [facility], [address] [place], [place] [address]    Report Status PENDING  Incomplete    Coagulation Studies: No results for input(s): LABPROT, INR in the last 72 hours.  Urinalysis: Recent Labs    02/03/18 2219  COLORURINE YELLOW*  LABSPEC 1.015  PHURINE 5.0  GLUCOSEU >=500*  HGBUR NEGATIVE  BILIRUBINUR NEGATIVE  KETONESUR 80*  PROTEINUR 30*  NITRITE NEGATIVE  LEUKOCYTESUR NEGATIVE      Imaging: Dg Chest Port 1 View  Result Date: 02/04/2018 CLINICAL DATA:  Leukocytosis EXAM: PORTABLE CHEST 1 VIEW COMPARISON:  Portable exam 0453 hours compared to 04/15/2017 FINDINGS: Enlargement of cardiac silhouette. Mediastinal contours and pulmonary vascularity normal. Atherosclerotic calcification aorta. Minimal chronic accentuation of pulmonary markings, stable Lungs otherwise clear. No acute infiltrate, pleural effusion or pneumothorax. Bones demineralized. IMPRESSION: Enlargement of cardiac silhouette. No acute abnormalities. Electronically Signed   By: Lavonia Dana M.D.   On: 02/04/2018 09:47      Assessment & Plan: Michaela Johnson is a 57 y.o. white female with diabetes mellitus type II insulin dependent, hypertension, diabetic neuropathy, atrial fibrillationGERD, depression, tobacco use, alcohol abuse who was admitted to [facility] on 02/03/2018 for Hyperkalemia [E87.5] Diabetic ketoacidosis without coma associated with type 1 diabetes mellitus ([place]) [E10.10]  1. Acute renal  failure  2. Chronic kidney disease stage III 3. Proteinuria 4. Metabolic acidosis secondary to DKA 5. Hyponatremia 6. Hyperkalemia  Plan Continue DKA protocol with insulin gtt  and IV fluids. Electroytes and kidney function improving - holding metforming, hydrochlorothiazide and lisinopril      LOS: 1 Alani Sabbagh 7/18/201911:28 AM

## 2018-02-05 ENCOUNTER — Inpatient Hospital Stay: Payer: Self-pay

## 2018-02-05 LAB — GLUCOSE, CAPILLARY
GLUCOSE-CAPILLARY: 122 mg/dL — AB (ref 70–99)
GLUCOSE-CAPILLARY: 125 mg/dL — AB (ref 70–99)
GLUCOSE-CAPILLARY: 180 mg/dL — AB (ref 70–99)
Glucose-Capillary: 176 mg/dL — ABNORMAL HIGH (ref 70–99)
Glucose-Capillary: 222 mg/dL — ABNORMAL HIGH (ref 70–99)
Glucose-Capillary: 250 mg/dL — ABNORMAL HIGH (ref 70–99)
Glucose-Capillary: 213 mg/dL — ABNORMAL HIGH (ref 70–99)
Glucose-Capillary: 240 mg/dL — ABNORMAL HIGH (ref 70–99)

## 2018-02-05 LAB — URINE CULTURE: Culture: NO GROWTH

## 2018-02-05 LAB — BLOOD GAS, ARTERIAL
ACID-BASE EXCESS: 2.4 mmol/L — AB (ref 0.0–2.0)
BICARBONATE: 26.3 mmol/L (ref 20.0–28.0)
FIO2: 0.68
O2 SAT: 94.3 %
PATIENT TEMPERATURE: 37
PCO2 ART: 37 mmHg (ref 32.0–48.0)
PO2 ART: 68 mmHg — AB (ref 83.0–108.0)
pH, Arterial: 7.46 — ABNORMAL HIGH (ref 7.350–7.450)

## 2018-02-05 LAB — CBC WITH DIFFERENTIAL/PLATELET
BASOS ABS: 0 10*3/uL (ref 0–0.1)
BASOS PCT: 0 %
EOS ABS: 0 10*3/uL (ref 0–0.7)
EOS PCT: 0 %
HEMATOCRIT: 33.5 % — AB (ref 35.0–47.0)
Hemoglobin: 11.6 g/dL — ABNORMAL LOW (ref 12.0–16.0)
Lymphocytes Relative: 6 %
Lymphs Abs: 1.2 10*3/uL (ref 1.0–3.6)
MCH: 34.3 pg — ABNORMAL HIGH (ref 26.0–34.0)
MCHC: 34.5 g/dL (ref 32.0–36.0)
MCV: 99.4 fL (ref 80.0–100.0)
MONO ABS: 1.1 10*3/uL — AB (ref 0.2–0.9)
MONOS PCT: 6 %
NEUTROS ABS: 17.4 10*3/uL — AB (ref 1.4–6.5)
Neutrophils Relative %: 88 %
PLATELETS: 310 10*3/uL (ref 150–440)
RBC: 3.37 MIL/uL — ABNORMAL LOW (ref 3.80–5.20)
RDW: 13.8 % (ref 11.5–14.5)
WBC: 19.8 10*3/uL — ABNORMAL HIGH (ref 3.6–11.0)

## 2018-02-05 LAB — COMPREHENSIVE METABOLIC PANEL
ALBUMIN: 2.8 g/dL — AB (ref 3.5–5.0)
ALT: 27 U/L (ref 0–44)
ANION GAP: 12 (ref 5–15)
AST: 40 U/L (ref 15–41)
Alkaline Phosphatase: 103 U/L (ref 38–126)
BILIRUBIN TOTAL: 0.8 mg/dL (ref 0.3–1.2)
BUN: 22 mg/dL — ABNORMAL HIGH (ref 6–20)
CHLORIDE: 101 mmol/L (ref 98–111)
CO2: 25 mmol/L (ref 22–32)
Calcium: 7.4 mg/dL — ABNORMAL LOW (ref 8.9–10.3)
Creatinine, Ser: 0.84 mg/dL (ref 0.44–1.00)
GFR calc Af Amer: >60 mL/min (ref >60)
GFR calc non Af Amer: >60 mL/min (ref >60)
GLUCOSE: 145 mg/dL — AB (ref 70–99)
POTASSIUM: 3.9 mmol/L (ref 3.5–5.1)
Sodium: 138 mmol/L (ref 135–145)
TOTAL PROTEIN: 6.4 g/dL — AB (ref 6.5–8.1)

## 2018-02-05 LAB — PHOSPHORUS: Phosphorus: 2.4 mg/dL — ABNORMAL LOW (ref 2.5–4.6)

## 2018-02-05 LAB — BRAIN NATRIURETIC PEPTIDE: B NATRIURETIC PEPTIDE 5: 339 pg/mL — AB (ref 0.0–100.0)

## 2018-02-05 LAB — LIPASE, BLOOD: LIPASE: 63 U/L — AB (ref 11–51)

## 2018-02-05 LAB — MAGNESIUM: Magnesium: 2.3 mg/dL (ref 1.7–2.4)

## 2018-02-05 MED ORDER — DILTIAZEM HCL 60 MG PO TABS
60.0000 mg | ORAL_TABLET | Freq: Two times a day (BID) | ORAL | Status: DC
  Administered 2018-02-05 – 2018-02-09 (×11): 60 mg via ORAL
  Filled 2018-02-05 (×11): qty 1

## 2018-02-05 MED ORDER — LORAZEPAM 0.5 MG PO TABS
0.5000 mg | ORAL_TABLET | Freq: Two times a day (BID) | ORAL | Status: DC
Start: 2018-02-05 — End: 2018-02-14
  Administered 2018-02-05 – 2018-02-14 (×19): 0.5 mg via ORAL
  Filled 2018-02-05 (×19): qty 1

## 2018-02-05 MED ORDER — BUDESONIDE 0.25 MG/2ML IN SUSP
0.2500 mg | Freq: Two times a day (BID) | RESPIRATORY_TRACT | Status: DC
  Administered 2018-02-05 – 2018-02-08 (×6): 0.25 mg via RESPIRATORY_TRACT
  Filled 2018-02-05 (×6): qty 2

## 2018-02-05 MED ORDER — INSULIN GLARGINE 100 UNIT/ML ~~LOC~~ SOLN
40.0000 [IU] | Freq: Every day | SUBCUTANEOUS | Status: DC
  Filled 2018-02-05: qty 0.4

## 2018-02-05 MED ORDER — FUROSEMIDE 10 MG/ML IJ SOLN
40.0000 mg | Freq: Once | INTRAMUSCULAR | Status: AC
  Administered 2018-02-05: 40 mg via INTRAVENOUS
  Filled 2018-02-05: qty 4

## 2018-02-05 MED ORDER — INSULIN GLARGINE 100 UNIT/ML ~~LOC~~ SOLN
10.0000 [IU] | Freq: Once | SUBCUTANEOUS | Status: AC
  Administered 2018-02-05: 10 [IU] via SUBCUTANEOUS
  Filled 2018-02-05: qty 0.1

## 2018-02-05 MED ORDER — FUROSEMIDE 10 MG/ML IJ SOLN
20.0000 mg | Freq: Once | INTRAMUSCULAR | Status: AC
  Administered 2018-02-05: 20 mg via INTRAVENOUS
  Filled 2018-02-05: qty 2

## 2018-02-05 MED ORDER — INSULIN GLARGINE 100 UNIT/ML ~~LOC~~ SOLN
45.0000 [IU] | Freq: Every day | SUBCUTANEOUS | Status: DC
  Administered 2018-02-05 – 2018-02-07 (×3): 45 [IU] via SUBCUTANEOUS
  Filled 2018-02-05 (×4): qty 0.45

## 2018-02-05 NOTE — Progress Notes (Signed)
Spoke with Burman Nieves, NP concerning patients O2 sats sustaining in the 80's with HFNC, FIO2 at 70% at 50 l/min. Per Burman Nieves, NP stated that is it ok for O2 sats to sustain 85% or better and clarified that patient has been ordered incentive spirometry.

## 2018-02-05 NOTE — Progress Notes (Signed)
[facility]  ROUNDING NOTE   Subjective:   Patient sitting up. Eating breakfast.   HFNC   Anion gap closed  Off insulin gtt  UOP 2800  Urine culture: kebsiella - on Unasyn  Objective:  Vital signs in last 24 hours:  Temp:  [97.7 F (36.5 C)-99.1 F (37.3 C)] 99.1 F (37.3 C) (07/19 0800) Pulse Rate:  [84-95] 89 (07/19 0800) Resp:  [20-27] 27 (07/19 0800) BP: (102-149)/(55-87) 134/71 (07/19 0800) SpO2:  [86 %-96 %] 86 % (07/19 0800) FiO2 (%):  [65 %] 65 % (07/19 0249) Weight:  [70.7 kg (155 lb 13.8 oz)] 70.7 kg (155 lb 13.8 oz) (07/19 0353)  Weight change: 0.392 kg (13.8 oz) Filed Weights   02/04/18 0036 02/04/18 0421 02/05/18 0353  Weight: 68.2 kg (150 lb 5.7 oz) 68.2 kg (150 lb 5.7 oz) 70.7 kg (155 lb 13.8 oz)    Intake/Output: I/O last 3 completed shifts: In: 5097.2 [I.V.:4793.8; IV Piggyback:303.3] Out: 2800 [Urine:2800]   Intake/Output this shift:  Total I/O In: -  Out: 115 [Urine:115]  Physical Exam: General: NAD, sitting in bed   Head: Normocephalic, atraumatic. Moist oral mucosal membranes  Eyes: Anicteric, PERRL  Neck: Supple, trachea midline  Lungs:  Coarse breath sounds, HFNC  Heart: Irregular tachycardia  Abdomen:  Soft, nontender  Extremities: No  peripheral edema.  Neurologic: Nonfocal, moving all four extremities  Skin: No lesions        Basic Metabolic Panel: Recent Labs  Lab 01/29/18 1927 01/30/18 0531  02/04/18 0448 02/04/18 0757 02/04/18 1200 02/04/18 1553 02/05/18 0317  NA 133* 135   < > 130* 129* 134* 134* 138  K 4.2 4.6   < > 4.2 3.9 3.8 3.7 3.9  CL 103 104   < > 91* 93* 99 99 101  CO2 21* 16*   < > 7* 13* 20* 23 25  GLUCOSE 152* 253*   < > 678* 463* 303* 189* 145*  BUN 43* 39*   < > 41* 39* 37* 35* 22*  CREATININE 1.18* 1.19*   < > 2.02* 1.95* 1.48* 1.29* 0.84  CALCIUM 8.0* 7.9*   < > 7.7* 7.1* 7.5* 7.3* 7.4*  MG 1.9 2.3  --   --   --  2.3  --  2.3  PHOS  --  2.1*  --   --   --   --   --  2.4*   < > =  values in this interval not displayed.    Liver Function Tests: Recent Labs  Lab 02/04/18 1200 02/05/18 0317  AST  --  40  ALT  --  27  ALKPHOS  --  103  BILITOT  --  0.8  PROT  --  6.4*  ALBUMIN 3.0* 2.8*   Recent Labs  Lab 02/04/18 1200 02/05/18 0317  LIPASE 163* 63*   No results for input(s): AMMONIA in the last 168 hours.  CBC: Recent Labs  Lab 01/30/18 0722 01/31/18 0727 02/03/18 2219 02/04/18 0448 02/05/18 0317  WBC 13.9* 6.9 23.6* 24.4* 19.8*  NEUTROABS  --   --  20.0*  --  17.4*  HGB 11.3* 12.0 11.7* 10.4* 11.6*  HCT 33.0* 34.2* 38.7 33.0* 33.5*  MCV 100.5* 99.1 117.4* 109.2* 99.4  PLT 277 254 367 332 310    Cardiac Enzymes: Recent Labs  Lab 02/03/18 2219 02/04/18 0448 02/04/18 0757  TROPONINI 0.07* 0.05* 0.12*    BNP: Invalid input(s): POCBNP  CBG: Recent Labs  Lab 02/04/18 2013  02/04/18 2115 02/05/18 0009 02/05/18 0348 02/05/18 0723  GLUCAP 123* 119* 122* 125* 176*    Microbiology: Results for orders placed or performed during the hospital encounter of 02/03/18  MRSA PCR Screening     Status: None   Collection Time: 02/04/18 12:32 AM  Result Value Ref Range Status   MRSA by PCR NEGATIVE NEGATIVE Final    Comment:        The GeneXpert MRSA Assay (FDA approved for NASAL specimens only), is one component of a comprehensive MRSA colonization surveillance program. It is not intended to diagnose MRSA infection nor to guide or monitor treatment for MRSA infections. Performed at [facility], [address] [place], [place] [address]   CULTURE, BLOOD (ROUTINE X 2) w Reflex to ID Panel     Status: None (Preliminary result)   Collection Time: 02/04/18  5:55 AM  Result Value Ref Range Status   Specimen Description BLOOD RIGHT ANTECUBITAL  Final   Special Requests   Final    BOTTLES DRAWN AEROBIC AND ANAEROBIC Blood Culture adequate volume   Culture   Final    NO GROWTH 1 DAY Performed at [facility], [address] [place], [place] [address]    Report Status PENDING  Incomplete  CULTURE, BLOOD (ROUTINE X 2) w Reflex to ID Panel     Status: None (Preliminary result)   Collection Time: 02/04/18  6:01 AM  Result Value Ref Range Status   Specimen Description BLOOD BLOOD RIGHT HAND  Final   Special Requests   Final    BOTTLES DRAWN AEROBIC AND ANAEROBIC Blood Culture results may not be optimal due to an inadequate volume of blood received in culture bottles   Culture   Final    NO GROWTH 1 DAY Performed at [facility], [address], [place], [place] [address]    Report Status PENDING  Incomplete    Coagulation Studies: No results for input(s): LABPROT, INR in the last 72 hours.  Urinalysis: Recent Labs    02/03/18 2219  COLORURINE YELLOW*  LABSPEC 1.015  PHURINE 5.0  GLUCOSEU >=500*  HGBUR NEGATIVE  BILIRUBINUR NEGATIVE  KETONESUR 80*  PROTEINUR 30*  NITRITE NEGATIVE  LEUKOCYTESUR NEGATIVE      Imaging: Dg Abd 1 View  Result Date: 02/04/2018 CLINICAL DATA:  Nausea, history of diabetes, depression, hypertension EXAM: ABDOMEN - 1 VIEW COMPARISON:  CT abdomen pelvis of 06/07/2016 FINDINGS: A supine view of the abdomen shows no bowel obstruction. There is a moderate of feces throughout the colon. No opaque calculi are seen. May be opacity at the right lung base and pneumonia cannot be excluded. Correlation with chest x-ray is recommended. A sclerotic area overlying the right superior acetabulum most likely represents a benign bone island. IMPRESSION: 1. No bowel obstruction. Moderate amount of feces throughout the colon. 2. Opacity overlying the right lung base could represent pneumonia. Correlate clinically. Electronically Signed   By: Ivar Drape M.D.   On: 02/04/2018 12:58   Dg Chest Port 1 View  Result Date: 02/04/2018 CLINICAL DATA:  Leukocytosis EXAM: PORTABLE CHEST 1 VIEW COMPARISON:  Portable exam 0453 hours compared to 04/15/2017 FINDINGS: Enlargement of  cardiac silhouette. Mediastinal contours and pulmonary vascularity normal. Atherosclerotic calcification aorta. Minimal chronic accentuation of pulmonary markings, stable Lungs otherwise clear. No acute infiltrate, pleural effusion or pneumothorax. Bones demineralized. IMPRESSION: Enlargement of cardiac silhouette. No acute abnormalities. Electronically Signed   By: Lavonia Dana M.D.   On: 02/04/2018 09:47  Medications:   . ampicillin-sulbactam (UNASYN) IV 3 g (02/05/18 0801)  . famotidine (PEPCID) IV Stopped (02/04/18 1630)   . aspirin  81 mg Oral Daily  . diltiazem  60 mg Oral Q12H  . docusate sodium  100 mg Oral BID  . folic acid  1 mg Oral Daily  . furosemide  40 mg Intravenous Once  . heparin  5,000 Units Subcutaneous Q8H  . insulin aspart  0-15 Units Subcutaneous Q4H  . insulin glargine  40 Units Subcutaneous QHS  . ipratropium-albuterol  3 mL Nebulization Q6H  . LORazepam  0.5 mg Oral BID  . multivitamin with minerals  1 tablet Oral Daily  . nicotine  14 mg Transdermal Daily  . pantoprazole  40 mg Oral Daily  . thiamine  100 mg Oral Daily   acetaminophen **OR** acetaminophen, bisacodyl, HYDROcodone-acetaminophen, LORazepam **OR** LORazepam, ondansetron **OR** ondansetron (ZOFRAN) IV, traZODone  Assessment/ Plan:  Ms. Michaela Johnson is a 57 y.o. white female  with diabetes mellitus type II insulin dependent, hypertension, diabetic neuropathy, atrial fibrillation, GERD, depression, tobacco use, alcohol abuse who was admitted to [facility] on 02/03/2018 for diabetic ketoacidosis with acute renal failure and hyperkalemia  1. Acute renal failure  2. Chronic kidney disease stage III 3. Proteinuria 4. Metabolic acidosis secondary to DKA 5. Hyponatremia 6. Hyperkalemia 7. Diabetes mellitus type II  Plan Acidosis, acute renal failure, potassium back to baseline.  - holding metforming, hydrochlorothiazide and lisinopril - Will need outpatient nephrology follow up for diabetes with  renal manifestations.    LOS: 2 Michaela Johnson 7/19/20198:42 AM

## 2018-02-05 NOTE — Progress Notes (Signed)
PT Cancellation Note  Patient Details Name: Michaela Johnson MRN: [id number] DOB: Sep 01, 1960   Cancelled Treatment:     Order received. Chart reviewed. Discussion with RN who states that PT is fine to work with pt despite elevated and upward trending troponin. RN states that MD would like pt to get up to chair. At beginning of session pts HR between 105-110 however with quick UE movement screen elevated to the 160s. PT will hold eval until pt more medically appropriate.  Yolonda Kida, SPT   Michaela Johnson 02/05/2018, 11:41 AM

## 2018-02-05 NOTE — Progress Notes (Signed)
Pharmacy Antibiotic Note  Michaela Johnson is a 57 y.o. female admitted on 02/03/2018 with DKA.  Pharmacy has been consulted for Unasyn dosing for possible aspiration pneumonia.  Plan: Continue Unasyn 3g IV Q6hr.    Height: 5\' 2"  (157.5 cm) Weight: 155 lb 13.8 oz (70.7 kg) IBW/kg (Calculated) : 50.1  Temp (24hrs), Avg:98.6 F (37 C), Min:97.7 F (36.5 C), Max:99.3 F (37.4 C)  Recent Labs  Lab 01/30/18 0722  01/31/18 0727 02/03/18 2219  02/04/18 0448 02/04/18 0757 02/04/18 1200 02/04/18 1553 02/05/18 0317  WBC 13.9*  --  6.9 23.6*  --  24.4*  --   --   --  19.8*  CREATININE  --    < >  --  1.96*   < > 2.02* 1.95* 1.48* 1.29* 0.84  LATICACIDVEN  --   --  1.0  --   --   --   --   --   --   --    < > = values in this interval not displayed.    Estimated Creatinine Clearance: 67.2 mL/min (by C-G formula based on SCr of 0.84 mg/dL).    No Known Allergies  Antimicrobials this admission: Unasyn 7/18 >>   Dose adjustments this admission: N/A  Microbiology results: 7/18 BCx: no growth x 1 day  7/18 UCx: no growth  7/18 MRSA PCR: negative   Thank you for allowing pharmacy to be a part of this patient's care.  Alexcia Schools L 02/05/2018 5:06 PM

## 2018-02-05 NOTE — Progress Notes (Signed)
OT Cancellation Note  Patient Details Name: Michaela Johnson MRN: [id number] DOB: 1961-02-11   Cancelled Treatment:    Reason Eval/Treat Not Completed: Medical issues which prohibited therapy(Troponin is trending up. Will conitnue to monitor, and initiate OT eval when appropriate.)  Harrel Carina, MS, OTR/L 02/05/2018, 10:56 AM

## 2018-02-05 NOTE — Progress Notes (Signed)
Patient was still having some hypoxia, good response to Lasix, will continue with Lasix follow, give additional 20 mg of Lasix now - Advised patient to stay up right -Use incentive spirometer and Pap therapy -Use BiPAP as needed/nighttime -Monitor in ICU  Michaela Johnson [facility]
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
## 2018-02-05 NOTE — Progress Notes (Signed)
[facility] [place] [facility] [place] NAME: Michaela Johnson    MR#:  [id number]  DATE OF BIRTH:  [birth year]  SUBJECTIVE:  CHIEF COMPLAINT:   Chief Complaint  Patient presents with  . Weakness  . Emesis  Patient seen and evaluated today Overnight patient became hypoxic and was put on high flow oxygen On insulin drip for control of blood sugars Decreased abdominal pain  REVIEW OF SYSTEMS:    ROS  CONSTITUTIONAL: No documented fever. No fatigue, weakness. No weight gain, no weight loss.  EYES: No blurry or double vision.  ENT: No tinnitus. No postnasal drip. No redness of the oropharynx.  RESPIRATORY: No cough, no wheeze, no hemoptysis. No dyspnea.  CARDIOVASCULAR: No chest pain. No orthopnea. No palpitations. No syncope.  GASTROINTESTINAL: Has nausea, Has no vomiting no diarrhea. Mild abdominal pain. No melena or hematochezia.  GENITOURINARY: No dysuria or hematuria.  ENDOCRINE: No polyuria or nocturia. No heat or cold intolerance.  HEMATOLOGY: No anemia. No bruising. No bleeding.  INTEGUMENTARY: No rashes. No lesions.  MUSCULOSKELETAL: No arthritis. No swelling. No gout.  NEUROLOGIC: No numbness, tingling, or ataxia. No seizure-type activity.  PSYCHIATRIC: No anxiety. No insomnia. No ADD.   DRUG ALLERGIES:  No Known Allergies  VITALS:  Blood pressure 132/72, pulse 84, temperature 99.3 F (37.4 C), temperature source Oral, resp. rate (!) 25, height 5\' 2"  (1.575 m), weight 70.7 kg (155 lb 13.8 oz), SpO2 (!) 86 %.  PHYSICAL EXAMINATION:   Physical Exam  GENERAL:  57 y.o.-year-old patient lying in the bed with no acute distress.  EYES: Pupils equal, round, reactive to light and accommodation. No scleral icterus. Extraocular muscles intact.  HEENT: Head atraumatic, normocephalic. Oropharynx and nasopharynx clear.  NECK:  Supple, no jugular venous distention. No thyroid enlargement, no tenderness.  LUNGS: Normal breath sounds bilaterally, no wheezing,  rales, rhonchi. No use of accessory muscles of respiration.  CARDIOVASCULAR: S1, S2 normal. No murmurs, rubs, or gallops.  ABDOMEN: Soft, mild tenderness around umbilicus, nondistended. Bowel sounds present. No organomegaly or mass.  EXTREMITIES: No cyanosis, clubbing or edema b/l.    NEUROLOGIC: Cranial nerves II through XII are intact. No focal Motor or sensory deficits b/l.   PSYCHIATRIC: The patient is alert and oriented x 3.  SKIN: No obvious rash, lesion, or ulcer.   LABORATORY PANEL:   CBC Recent Labs  Lab 02/05/18 0317  WBC 19.8*  HGB 11.6*  HCT 33.5*  PLT 310   ------------------------------------------------------------------------------------------------------------------ Chemistries  Recent Labs  Lab 02/05/18 0317  NA 138  K 3.9  CL 101  CO2 25  GLUCOSE 145*  BUN 22*  CREATININE 0.84  CALCIUM 7.4*  MG 2.3  AST 40  ALT 27  ALKPHOS 103  BILITOT 0.8   ------------------------------------------------------------------------------------------------------------------  Cardiac Enzymes Recent Labs  Lab 02/04/18 0757  TROPONINI 0.12*   ------------------------------------------------------------------------------------------------------------------  RADIOLOGY:  Dg Chest 1 View  Result Date: 02/05/2018 CLINICAL DATA:  57 year old with shortness of breath. EXAM: CHEST  1 VIEW COMPARISON:  02/04/2018 FINDINGS: Markedly increased densities throughout both lungs. Heart size is within normal limits. Mediastinum is mildly prominent but stable. Round dense structures in the left paratracheal region could be overlying the patient versus mediastinal calcifications. Negative for pneumothorax. The trachea is midline. IMPRESSION: Markedly increased parenchymal densities in both lungs. Differential diagnosis includes pulmonary edema versus infection. Based on the rapid onset, favor pulmonary edema. Electronically Signed   By: Markus Daft M.D.   On: 02/05/2018 10:25  Dg Abd 1  View  Result Date: 02/04/2018 CLINICAL DATA:  Nausea, history of diabetes, depression, hypertension EXAM: ABDOMEN - 1 VIEW COMPARISON:  CT abdomen pelvis of 06/07/2016 FINDINGS: A supine view of the abdomen shows no bowel obstruction. There is a moderate of feces throughout the colon. No opaque calculi are seen. May be opacity at the right lung base and pneumonia cannot be excluded. Correlation with chest x-ray is recommended. A sclerotic area overlying the right superior acetabulum most likely represents a benign bone island. IMPRESSION: 1. No bowel obstruction. Moderate amount of feces throughout the colon. 2. Opacity overlying the right lung base could represent pneumonia. Correlate clinically. Electronically Signed   By: Ivar Drape M.D.   On: 02/04/2018 12:58   Dg Chest Port 1 View  Result Date: 02/04/2018 CLINICAL DATA:  Leukocytosis EXAM: PORTABLE CHEST 1 VIEW COMPARISON:  Portable exam 0453 hours compared to 04/15/2017 FINDINGS: Enlargement of cardiac silhouette. Mediastinal contours and pulmonary vascularity normal. Atherosclerotic calcification aorta. Minimal chronic accentuation of pulmonary markings, stable Lungs otherwise clear. No acute infiltrate, pleural effusion or pneumothorax. Bones demineralized. IMPRESSION: Enlargement of cardiac silhouette. No acute abnormalities. Electronically Signed   By: Lavonia Dana M.D.   On: 02/04/2018 09:47     ASSESSMENT AND PLAN:   57 year old female patient with history of diabetes mellitus, alcohol abuse, GERD, hypertension, tobacco abuse currently in stepdown unit for diabetic ketoacidosis.  -Diabetic ketoacidosis improving slowly IV insulin drip on board Monitor blood sugars closely IV fluids  -Metabolic acidosis secondary to diabetic ketoacidosis resolving slowly Monitor anion gap  -Hypoxia Fluid overload versus aspiration -Oxygen Incentive spirometry  -Aspiration On IV Unasyn antibiotic Follow-up cultures  -Tobacco abuse Tobacco  cessation counseled to the patient for 6 minutes Nicotine patch offered  -Hypeerkalemia Monitor potassium levels  -Alcohol abuse ciwa protocol Thiamine and folate supplements  -Elevated troponin secondary to demand ischemia  -Acute kidney injury secondary to dehydration improved with IV fluids  All the records are reviewed and case discussed with Care Management/Social Worker. Management plans discussed with the patient, family and they are in agreement.  CODE STATUS: Full code  DVT Prophylaxis: SCDs  TOTAL TIME TAKING CARE OF THIS PATIENT: 35 minutes.   POSSIBLE D/C IN 2 to 3 DAYS, DEPENDING ON CLINICAL CONDITION.  Saundra Shelling M.D on 02/05/2018 at 1:41 PM  Between 7am to 6pm - Pager - [phone]  After 6pm go to [email or web address] - password [email or web address] [facility]  [facility] [place] [facility]  Office  [phone]  CC: Primary care physician; [name], NP  Note: This dictation was prepared with Dragon dictation along with smaller phrase technology. Any transcriptional errors that result from this process are unintentional.

## 2018-02-05 NOTE — Progress Notes (Addendum)
Pt alert and oriented x4. Denies pain and SOB at this time and resting in bed. Report given to [employer], RN.

## 2018-02-06 ENCOUNTER — Inpatient Hospital Stay: Payer: Self-pay

## 2018-02-06 DIAGNOSIS — F1010 Alcohol abuse, uncomplicated: Secondary | ICD-10-CM

## 2018-02-06 DIAGNOSIS — E1065 Type 1 diabetes mellitus with hyperglycemia: Secondary | ICD-10-CM

## 2018-02-06 DIAGNOSIS — J690 Pneumonitis due to inhalation of food and vomit: Secondary | ICD-10-CM

## 2018-02-06 LAB — GLUCOSE, CAPILLARY
GLUCOSE-CAPILLARY: 96 mg/dL (ref 70–99)
GLUCOSE-CAPILLARY: 151 mg/dL — AB (ref 70–99)
GLUCOSE-CAPILLARY: 159 mg/dL — AB (ref 70–99)
GLUCOSE-CAPILLARY: 105 mg/dL — AB (ref 70–99)
GLUCOSE-CAPILLARY: 201 mg/dL — AB (ref 70–99)
Glucose-Capillary: 150 mg/dL — ABNORMAL HIGH (ref 70–99)
Glucose-Capillary: 64 mg/dL — ABNORMAL LOW (ref 70–99)

## 2018-02-06 LAB — BLOOD GAS, ARTERIAL
Acid-Base Excess: 8.1 mmol/L — ABNORMAL HIGH (ref 0.0–2.0)
Bicarbonate: 32 mmol/L — ABNORMAL HIGH (ref 20.0–28.0)
FIO2: 0.7
O2 Saturation: 90.5 %
Patient temperature: 37
pCO2 arterial: 41 mmHg (ref 32.0–48.0)
pH, Arterial: 7.5 — ABNORMAL HIGH (ref 7.350–7.450)
pO2, Arterial: 54 mmHg — ABNORMAL LOW (ref 83.0–108.0)

## 2018-02-06 MED ORDER — SODIUM CHLORIDE 0.9 % IV SOLN
INTRAVENOUS | Status: DC | PRN
  Administered 2018-02-06 – 2018-02-07 (×2): via INTRAVENOUS

## 2018-02-06 MED ORDER — FUROSEMIDE 10 MG/ML IJ SOLN
40.0000 mg | Freq: Once | INTRAMUSCULAR | Status: AC
  Administered 2018-02-06: 40 mg via INTRAVENOUS

## 2018-02-06 NOTE — Progress Notes (Addendum)
Pts FSBS down to 63. No signs or symptoms. Pt stated that her blood sugar does drop at times and that she did eat dinner last night. Pt able to drink 4oz of OJ. FSBS on recheck was 96. Pt instructed to call if she develops signs or symptoms of hypoglycemia. Will continue to monitor pt closely.
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
## 2018-02-06 NOTE — Progress Notes (Signed)
OT Cancellation Note  Patient Details Name: Michaela Johnson MRN: [id number] DOB: 19-Mar-1961   Cancelled Treatment:    Reason Eval/Treat Not Completed: Medical issues which prohibited therapy OT spoke with RN who reported pt is on high-flow cannula and still with oxygen saturation in the 80s. Evaluation withheld at this time. Will f/u as pt becomes more appropriate.    Gerrianne Scale, MS, OTR/L ascom [phone] or [phone] 02/06/18, 8:54 AM

## 2018-02-06 NOTE — Progress Notes (Addendum)
Patient found with a considerable amount of blood with small clots noted on gown, abdomen area. No evident source of bleeding, suspect abdomen from previous heparin injection. Maggie, NP in to see patient. Verbal order received to hold tonight's dose of heparin.

## 2018-02-06 NOTE — Progress Notes (Signed)
[place] at [place] NAME: Emalea Mix    MR#:  [id number]  DATE OF BIRTH:  May 30, 1961  SUBJECTIVE:  CHIEF COMPLAINT:   Chief Complaint  Patient presents with  . Weakness  . Emesis  Patient seen and evaluated today On high flow oxygen Is off insulin drip No abdominal pain  no nausea and vomiting No fever and chills  REVIEW OF SYSTEMS:    ROS  CONSTITUTIONAL: No documented fever. No fatigue, weakness. No weight gain, no weight loss.  EYES: No blurry or double vision.  ENT: No tinnitus. No postnasal drip. No redness of the oropharynx.  RESPIRATORY: No cough, no wheeze, no hemoptysis.  Decreased dyspnea.  CARDIOVASCULAR: No chest pain. No orthopnea. No palpitations. No syncope.  GASTROINTESTINAL: Has nausea, Has no vomiting no diarrhea. Mild abdominal pain. No melena or hematochezia.  GENITOURINARY: No dysuria or hematuria.  ENDOCRINE: No polyuria or nocturia. No heat or cold intolerance.  HEMATOLOGY: No anemia. No bruising. No bleeding.  INTEGUMENTARY: No rashes. No lesions.  MUSCULOSKELETAL: No arthritis. No swelling. No gout.  NEUROLOGIC: No numbness, tingling, or ataxia. No seizure-type activity.  PSYCHIATRIC: No anxiety. No insomnia. No ADD.   DRUG ALLERGIES:  No Known Allergies  VITALS:  Blood pressure 127/61, pulse 81, temperature 99.3 F (37.4 C), temperature source Oral, resp. rate (!) 26, height 5\' 2"  (1.575 m), weight 70 kg (154 lb 5.2 oz), SpO2 (!) 89 %.  PHYSICAL EXAMINATION:   Physical Exam  GENERAL:  57 y.o.-year-old patient lying in the bed with no acute distress.  EYES: Pupils equal, round, reactive to light and accommodation. No scleral icterus. Extraocular muscles intact.  HEENT: Head atraumatic, normocephalic. Oropharynx and nasopharynx clear.  NECK:  Supple, no jugular venous distention. No thyroid enlargement, no tenderness.  LUNGS: Decreased breath sounds bilaterally, scattered Rales in both lungs. No  use of accessory muscles of respiration.  CARDIOVASCULAR: S1, S2 normal. No murmurs, rubs, or gallops.  ABDOMEN: Soft, mild tenderness around umbilicus, nondistended. Bowel sounds present. No organomegaly or mass.  EXTREMITIES: No cyanosis, clubbing or edema b/l.    NEUROLOGIC: Cranial nerves II through XII are intact. No focal Motor or sensory deficits b/l.   PSYCHIATRIC: The patient is alert and oriented x 3.  SKIN: No obvious rash, lesion, or ulcer.   LABORATORY PANEL:   CBC Recent Labs  Lab 02/05/18 0317  WBC 19.8*  HGB 11.6*  HCT 33.5*  PLT 310   ------------------------------------------------------------------------------------------------------------------ Chemistries  Recent Labs  Lab 02/05/18 0317  NA 138  K 3.9  CL 101  CO2 25  GLUCOSE 145*  BUN 22*  CREATININE 0.84  CALCIUM 7.4*  MG 2.3  AST 40  ALT 27  ALKPHOS 103  BILITOT 0.8   ------------------------------------------------------------------------------------------------------------------  Cardiac Enzymes Recent Labs  Lab 02/04/18 0757  TROPONINI 0.12*   ------------------------------------------------------------------------------------------------------------------  RADIOLOGY:  Dg Chest 1 View  Result Date: 02/06/2018 CLINICAL DATA:  Shortness of breath. EXAM: CHEST  1 VIEW COMPARISON:  Chest x-ray from yesterday. FINDINGS: Stable cardiomediastinal silhouette. Diffuse interstitial opacities are similar to prior study, with improved areas of more confluent alveolar opacity seen on prior study. No pleural effusion or pneumothorax. No acute osseous abnormality. IMPRESSION: 1. Improved alveolar edema with no significant interval change in diffuse interstitial edema. Electronically Signed   By: Titus Dubin M.D.   On: 02/06/2018 08:49   Dg Chest 1 View  Result Date: 02/05/2018 CLINICAL DATA:  57 year old with shortness of breath. EXAM:  CHEST  1 VIEW COMPARISON:  02/04/2018 FINDINGS: Markedly  increased densities throughout both lungs. Heart size is within normal limits. Mediastinum is mildly prominent but stable. Round dense structures in the left paratracheal region could be overlying the patient versus mediastinal calcifications. Negative for pneumothorax. The trachea is midline. IMPRESSION: Markedly increased parenchymal densities in both lungs. Differential diagnosis includes pulmonary edema versus infection. Based on the rapid onset, favor pulmonary edema. Electronically Signed   By: Markus Daft M.D.   On: 02/05/2018 10:25   Dg Abd 1 View  Result Date: 02/04/2018 CLINICAL DATA:  Nausea, history of diabetes, depression, hypertension EXAM: ABDOMEN - 1 VIEW COMPARISON:  CT abdomen pelvis of 06/07/2016 FINDINGS: A supine view of the abdomen shows no bowel obstruction. There is a moderate of feces throughout the colon. No opaque calculi are seen. May be opacity at the right lung base and pneumonia cannot be excluded. Correlation with chest x-ray is recommended. A sclerotic area overlying the right superior acetabulum most likely represents a benign bone island. IMPRESSION: 1. No bowel obstruction. Moderate amount of feces throughout the colon. 2. Opacity overlying the right lung base could represent pneumonia. Correlate clinically. Electronically Signed   By: Ivar Drape M.D.   On: 02/04/2018 12:58     ASSESSMENT AND PLAN:   57 year old female patient with history of diabetes mellitus, alcohol abuse, GERD, hypertension, tobacco abuse currently in stepdown unit for diabetic ketoacidosis, hypoxia  -Diabetic ketoacidosis resolved Off IV insulin drip On long-acting insulin and sliding scale coverage Monitor blood sugars closely  -Metabolic acidosis resolved  -Acute respiratory distress with hypoxia Fluid overload versus aspiration -Oxygen via high flow Incentive spirometry IV Unasyn antibiotic Follow-up cultures  -Atrial fibrillation on diltiazem for rate control  -Aspiration  pneumonitis On IV Unasyn antibiotic Follow-up cultures  -Tobacco abuse Tobacco cessation counseled to the patient for 6 minutes Nicotine patch offered  -Hyperkalemia resolved  -Alcohol abuse ciwa protocol Thiamine and folate supplements  -Elevated troponin secondary to demand ischemia  -Acute kidney injury secondary to dehydration resolved  All the records are reviewed and case discussed with Care Management/Social Worker. Management plans discussed with the patient, family and they are in agreement.  CODE STATUS: Full code  DVT Prophylaxis: SCDs  TOTAL TIME TAKING CARE OF THIS PATIENT: 34 minutes.   POSSIBLE D/C IN 2 to 3 DAYS, DEPENDING ON CLINICAL CONDITION.  Saundra Shelling M.D on 02/06/2018 at 11:07 AM  Between 7am to 6pm - Pager - [phone]  After 6pm go to [email or web address] - password [email or web address] [facility]  S[facility] [place] [facility]  Office  [phone]  CC: Primary care physician; [name], NP  Note: This dictation was prepared with Dragon dictation along with smaller phrase technology. Any transcriptional errors that result from this process are unintentional.

## 2018-02-06 NOTE — Progress Notes (Signed)
PT Cancellation Note  Patient Details Name: Michaela Johnson MRN: [id number] DOB: 06-Jul-1961   Cancelled Treatment:    Reason Eval/Treat Not Completed: Medical issues which prohibited therapy(Chart reviewed, RN consulted. Pt now on 50L via HFNC, SpO2 85% while resting. Will attempt again at later date/time as patient is better anble to participate. )  1:55 PM, 02/06/18 Etta Grandchild, PT, DPT Physical Therapist - [facility]  [phone] ([place])      Buccola,Allan C 02/06/2018, 1:55 PM

## 2018-02-07 ENCOUNTER — Other Ambulatory Visit: Payer: Self-pay

## 2018-02-07 DIAGNOSIS — Z9114 Patient's other noncompliance with medication regimen: Secondary | ICD-10-CM | POA: Insufficient documentation

## 2018-02-07 DIAGNOSIS — Z91148 Patient's other noncompliance with medication regimen for other reason: Secondary | ICD-10-CM | POA: Insufficient documentation

## 2018-02-07 LAB — GLUCOSE, CAPILLARY
GLUCOSE-CAPILLARY: 124 mg/dL — AB (ref 70–99)
GLUCOSE-CAPILLARY: 192 mg/dL — AB (ref 70–99)
GLUCOSE-CAPILLARY: 198 mg/dL — AB (ref 70–99)
GLUCOSE-CAPILLARY: 221 mg/dL — AB (ref 70–99)
Glucose-Capillary: 100 mg/dL — ABNORMAL HIGH (ref 70–99)
Glucose-Capillary: 149 mg/dL — ABNORMAL HIGH (ref 70–99)
Glucose-Capillary: 96 mg/dL (ref 70–99)

## 2018-02-07 LAB — BASIC METABOLIC PANEL
ANION GAP: 8 (ref 5–15)
BUN: 9 mg/dL (ref 6–20)
CO2: 29 mmol/L (ref 22–32)
Calcium: 8.2 mg/dL — ABNORMAL LOW (ref 8.9–10.3)
Chloride: 99 mmol/L (ref 98–111)
Creatinine, Ser: 0.64 mg/dL (ref 0.44–1.00)
Glucose, Bld: 115 mg/dL — ABNORMAL HIGH (ref 70–99)
POTASSIUM: 3.6 mmol/L (ref 3.5–5.1)
SODIUM: 136 mmol/L (ref 135–145)

## 2018-02-07 LAB — CBC
HCT: 29.9 % — ABNORMAL LOW (ref 35.0–47.0)
Hemoglobin: 10.3 g/dL — ABNORMAL LOW (ref 12.0–16.0)
MCH: 34 pg (ref 26.0–34.0)
MCHC: 34.5 g/dL (ref 32.0–36.0)
MCV: 98.5 fL (ref 80.0–100.0)
PLATELETS: 233 10*3/uL (ref 150–440)
RBC: 3.04 MIL/uL — AB (ref 3.80–5.20)
RDW: 14.3 % (ref 11.5–14.5)
WBC: 11.4 10*3/uL — ABNORMAL HIGH (ref 3.6–11.0)

## 2018-02-07 LAB — APTT: APTT: 29 s (ref 24–36)

## 2018-02-07 LAB — PROTIME-INR
INR: 0.99
Prothrombin Time: 13 seconds (ref 11.4–15.2)

## 2018-02-07 MED ORDER — POTASSIUM CHLORIDE CRYS ER 20 MEQ PO TBCR
30.0000 meq | EXTENDED_RELEASE_TABLET | Freq: Once | ORAL | Status: AC
  Administered 2018-02-07: 30 meq via ORAL
  Filled 2018-02-07: qty 2

## 2018-02-07 NOTE — Progress Notes (Signed)
Patient got up tot the chair with minimal assistance today. Patient required encouragement to stay in the chair as she was requesting to get back into the bed.  Patient stayed for a total of 2 hours in the chair.  We gave PRN Bisacodyl for constipation as last bowel movement was on 7/17.  While awake, the patient's oxygen saturation remains low to mid 90s, but when she falls asleep, her SATS drop into the mid 80s.

## 2018-02-07 NOTE — Progress Notes (Signed)
[facility] [place] NAME: Michaela Johnson    MR#:  [id number]  DATE OF BIRTH:  July 05, 1961  SUBJECTIVE:  CHIEF COMPLAINT:   Chief Complaint  Patient presents with  . Weakness  . Emesis  Patient seen and evaluated today On high flow oxygen Is off insulin drip No abdominal pain  no nausea and vomiting No fever and chills  REVIEW OF SYSTEMS:    ROS  CONSTITUTIONAL: No documented fever. No fatigue, weakness. No weight gain, no weight loss.  EYES: No blurry or double vision.  ENT: No tinnitus. No postnasal drip. No redness of the oropharynx.  RESPIRATORY: No cough, no wheeze, no hemoptysis.  Decreased dyspnea.  CARDIOVASCULAR: No chest pain. No orthopnea. No palpitations. No syncope.  GASTROINTESTINAL: Has nausea, Has no vomiting no diarrhea. Mild abdominal pain. No melena or hematochezia.  GENITOURINARY: No dysuria or hematuria.  ENDOCRINE: No polyuria or nocturia. No heat or cold intolerance.  HEMATOLOGY: No anemia. No bruising. No bleeding.  INTEGUMENTARY: No rashes. No lesions.  MUSCULOSKELETAL: No arthritis. No swelling. No gout.  NEUROLOGIC: No numbness, tingling, or ataxia. No seizure-type activity.  PSYCHIATRIC: No anxiety. No insomnia. No ADD.   DRUG ALLERGIES:  No Known Allergies  VITALS:  Blood pressure 122/76, pulse 86, temperature 99.2 F (37.3 C), temperature source Oral, resp. rate (!) 34, height 5\' 2"  (1.575 m), weight 67.4 kg (148 lb 9.4 oz), SpO2 90 %.  PHYSICAL EXAMINATION:   Physical Exam  GENERAL:  57 y.o.-year-old patient lying in the bed with no acute distress.  EYES: Pupils equal, round, reactive to light and accommodation. No scleral icterus. Extraocular muscles intact.  HEENT: Head atraumatic, normocephalic. Oropharynx and nasopharynx clear.  NECK:  Supple, no jugular venous distention. No thyroid enlargement, no tenderness.  LUNGS: Decreased breath sounds bilaterally, scattered Rales in both lungs. No  use of accessory muscles of respiration.  CARDIOVASCULAR: S1, S2 normal. No murmurs, rubs, or gallops.  ABDOMEN: Soft, mild tenderness around umbilicus, nondistended. Bowel sounds present. No organomegaly or mass.  EXTREMITIES: No cyanosis, clubbing or edema b/l.    NEUROLOGIC: Cranial nerves II through XII are intact. No focal Motor or sensory deficits b/l.   PSYCHIATRIC: The patient is alert and oriented x 3.  SKIN: No obvious rash, lesion, or ulcer.   LABORATORY PANEL:   CBC Recent Labs  Lab 02/07/18 0535  WBC 11.4*  HGB 10.3*  HCT 29.9*  PLT 233   ------------------------------------------------------------------------------------------------------------------ Chemistries  Recent Labs  Lab 02/05/18 0317 02/07/18 0535  NA 138 136  K 3.9 3.6  CL 101 99  CO2 25 29  GLUCOSE 145* 115*  BUN 22* 9  CREATININE 0.84 0.64  CALCIUM 7.4* 8.2*  MG 2.3  --   AST 40  --   ALT 27  --   ALKPHOS 103  --   BILITOT 0.8  --    ------------------------------------------------------------------------------------------------------------------  Cardiac Enzymes Recent Labs  Lab 02/04/18 0757  TROPONINI 0.12*   ------------------------------------------------------------------------------------------------------------------  RADIOLOGY:  Dg Chest 1 View  Result Date: 02/06/2018 CLINICAL DATA:  Shortness of breath. EXAM: CHEST  1 VIEW COMPARISON:  Chest x-ray from yesterday. FINDINGS: Stable cardiomediastinal silhouette. Diffuse interstitial opacities are similar to prior study, with improved areas of more confluent alveolar opacity seen on prior study. No pleural effusion or pneumothorax. No acute osseous abnormality. IMPRESSION: 1. Improved alveolar edema with no significant interval change in diffuse interstitial edema. Electronically Signed   By: Orville Govern.D.  On: 02/06/2018 08:49     ASSESSMENT AND PLAN:   57 year old female patient with history of diabetes mellitus,  alcohol abuse, GERD, hypertension, tobacco abuse currently in stepdown unit for diabetic ketoacidosis, hypoxia  -Diabetic ketoacidosis resolved Off IV insulin drip On long-acting insulin and sliding scale coverage Monitor blood sugars closely  -Metabolic acidosis resolved  -Acute respiratory distress with hypoxia Fluid overload versus aspiration -Oxygen via high flow Incentive spirometry IV Unasyn antibiotic Follow-up cultures  -Atrial fibrillation on diltiazem for rate control  -Aspiration pneumonitis On IV Unasyn antibiotic Follow-up cultures  -Tobacco abuse Tobacco cessation counseled to the patient for 6 minutes Nicotine patch offered  -Hyperkalemia resolved  -Alcohol abuse ciwa protocol Thiamine and folate supplements  -Elevated troponin secondary to demand ischemia  -Acute kidney injury secondary to dehydration resolved  All the records are reviewed and case discussed with Care Management/Social Worker. Management plans discussed with the patient, family and they are in agreement.  CODE STATUS: Full code  DVT Prophylaxis: SCDs  TOTAL TIME TAKING CARE OF THIS PATIENT: 34 minutes.   POSSIBLE D/C IN 2 to 3 DAYS, DEPENDING ON CLINICAL CONDITION.  [name] M.D on 02/07/2018 at 5:48 PM  Between 7am to 6pm - Pager - [phone]  After 6pm go to [email or web address] - password [email or web address] [facility]  [facility]  Office  [phone]  CC: Primary care physician; [name], NP  Note: This dictation was prepared with Dragon dictation along with smaller phrase technology. Any transcriptional errors that result from this process are unintentional.

## 2018-02-07 NOTE — Progress Notes (Signed)
PT Cancellation Note  Patient Details Name: Michaela Johnson MRN: [id number] DOB: 1960-08-19   Cancelled Treatment:    Reason Eval/Treat Not Completed: Medical issues which prohibited therapy(Pt still with acute decline in lung function, remains on 50L HFNC and SpO2 91%. WIll cont to follow acutely and evaluate as pt is better able to participate. )  9:19 AM, 02/07/18 Etta Grandchild, PT, DPT Physical Therapist - [place] ([place])     Buccola,Allan C 02/07/2018, 9:19 AM

## 2018-02-07 NOTE — Progress Notes (Deleted)
Cardiology Office Note  Date:  02/07/2018   ID:  Michaela Johnson, DOB 10/18/1960, MRN [id number]  PCP:  [name], NP   No chief complaint on file.   HPI:  Michaela Johnson is a 57 year old woman with prior history of  paroxysmal atrial fibrillation, insulin-dependent diabetes,  prior history of DKA,  cocaine,  neuropathy,  Hospitalization with DKA, nausea vomiting potassium 7.1 sodium level 123 Who presents for follow-up of her atrial fibrillation  In the hospital Given insulin, fluids, bicarbonate, calcium, Kayexalate Improvement of her electrolytes On transfer to the emergency room noted to have atrial fibrillation rate 130 bpm In retrospect she denies any symptoms Started on Cardizem infusion for rate control and converted to normal sinus rhythm Secondary to hypotension diltiazem weaned off   Given antibiotics IV fluids and insulin infusion Nausea vomiting dramatically improved not been taking some of her medications recently secondary to stressors (" family stress")  1) paroxysmal atrial fibrillation Long standing history Recent stressors contributing to her earlier episode today, nausea vomiting electrolyte imbalance Echocardiogram pending [facility] VASC For given hypertension diabetes vascular disease and female ---Unclear if she would be compliant with anticoagulation. Reports she has been missing her diabetes medication leading to DKA And history of polysubstance abuse ---currently low blood pressure will not allow rate and rhythm controlling medications Would watch for now  2) DKA Not taking insulin at home secondary to family stressors Sodium and potassium improved Currently on insulin infusion with IV fluids Prior history DKA    PMH:   has a past medical history of Alcohol use, Depression, Diabetes mellitus without complication ([place]), GERD (gastroesophageal reflux disease), Hypertension, Neuropathy, and Tobacco use.  PSH:    Past Surgical History:  Procedure  Laterality Date  . NO PAST SURGERIES      No current facility-administered medications for this visit.    No current outpatient medications on file.   Facility-Administered Medications Ordered in Other Visits  Medication Dose Route Frequency Provider Last Rate Last Dose  . 0.9 %  sodium chloride infusion   Intravenous PRN Tukov-Yual, Magdalene S, NP 5 mL/hr at 02/07/18 0700    . acetaminophen (TYLENOL) tablet 650 mg  650 mg Oral Q6H PRN Amelia Jo, MD   650 mg at 02/06/18 1430   Or  . acetaminophen (TYLENOL) suppository 650 mg  650 mg Rectal Q6H PRN Amelia Jo, MD      . Ampicillin-Sulbactam (UNASYN) 3 g in sodium chloride 0.9 % 100 mL IVPB  3 g Intravenous Q6H Saundra Shelling, MD   Stopped at 02/07/18 1627  . aspirin chewable tablet 81 mg  81 mg Oral Daily Lahoma Rocker, MD   81 mg at 02/07/18 1022  . bisacodyl (DULCOLAX) EC tablet 5 mg  5 mg Oral Daily PRN Amelia Jo, MD   5 mg at 02/07/18 1152  . budesonide (PULMICORT) nebulizer solution 0.25 mg  0.25 mg Nebulization BID Lahoma Rocker, MD   0.25 mg at 02/07/18 0816  . diltiazem (CARDIZEM) tablet 60 mg  60 mg Oral Q12H Awilda Bill, NP   60 mg at 02/07/18 1021  . docusate sodium (COLACE) capsule 100 mg  100 mg Oral BID Amelia Jo, MD   100 mg at 02/07/18 1022  . folic acid (FOLVITE) tablet 1 mg  1 mg Oral Daily Awilda Bill, NP   1 mg at 02/07/18 1021  . heparin injection 5,000 Units  5,000 Units Subcutaneous Q8H Amelia Jo, MD   5,000 Units at 02/07/18 1318  .  HYDROcodone-acetaminophen (NORCO/VICODIN) 5-325 MG per tablet 1-2 tablet  1-2 tablet Oral Q4H PRN Amelia Jo, MD      . insulin aspart (novoLOG) injection 0-15 Units  0-15 Units Subcutaneous Q4H Lahoma Rocker, MD   3 Units at 02/07/18 1634  . insulin glargine (LANTUS) injection 45 Units  45 Units Subcutaneous QHS Lahoma Rocker, MD   45 Units at 02/06/18 2209  . ipratropium-albuterol (DUONEB) 0.5-2.5 (3) MG/3ML nebulizer solution 3 mL  3 mL Nebulization Q6H Lahoma Rocker, MD   3 mL at 02/07/18 1416  . LORazepam (ATIVAN) tablet 1 mg  1 mg Oral Q6H PRN Awilda Bill, NP       Or  . LORazepam (ATIVAN) injection 1 mg  1 mg Intravenous Q6H PRN Awilda Bill, NP      . LORazepam (ATIVAN) tablet 0.5 mg  0.5 mg Oral BID Lahoma Rocker, MD   0.5 mg at 02/07/18 1020  . multivitamin with minerals tablet 1 tablet  1 tablet Oral Daily Awilda Bill, NP   1 tablet at 02/07/18 1021  . nicotine (NICODERM CQ - dosed in mg/24 hours) patch 14 mg  14 mg Transdermal Daily Lahoma Rocker, MD   14 mg at 02/07/18 1028  . ondansetron (ZOFRAN) tablet 4 mg  4 mg Oral Q6H PRN Amelia Jo, MD   4 mg at 02/06/18 0457   Or  . ondansetron [facility]) injection 4 mg  4 mg Intravenous Q6H PRN Amelia Jo, MD   4 mg at 02/06/18 2353  . pantoprazole (PROTONIX) EC tablet 40 mg  40 mg Oral Daily Amelia Jo, MD   40 mg at 02/07/18 1025  . thiamine (VITAMIN B-1) tablet 100 mg  100 mg Oral Daily Awilda Bill, NP   100 mg at 02/07/18 1022  . traZODone (DESYREL) tablet 25 mg  25 mg Oral QHS PRN Amelia Jo, MD         Allergies:   Patient has no known allergies.   Social History:  The patient  reports that she has been smoking.  She has a 41.00 pack-year smoking history. She has never used smokeless tobacco. She reports that she drinks alcohol. She reports that she has current or past drug history. Drug: Marijuana.   Family History:   family history includes CAD in her father; Diabetes in her mother.    Review of Systems: ROS   PHYSICAL EXAM: VS:  There were no vitals taken for this visit. , BMI There is no height or weight on file to calculate BMI. GEN: Well nourished, well developed, in no acute distress HEENT: normal Neck: no JVD, carotid bruits, or masses Cardiac: RRR; no murmurs, rubs, or gallops,no edema  Respiratory:  clear to auscultation bilaterally, normal work of breathing GI: soft, nontender, nondistended, + BS MS: no deformity or atrophy Skin: warm and dry,  no rash Neuro:  Strength and sensation are intact Psych: euthymic mood, full affect    Recent Labs: 01/29/2018: TSH 0.646 02/05/2018: ALT 27; B Natriuretic Peptide 339.0; Magnesium 2.3 02/07/2018: BUN 9; Creatinine, Ser 0.64; Hemoglobin 10.3; Platelets 233; Potassium 3.6; Sodium 136    Lipid Panel Lab Results  Component Value Date   CHOL 136 01/30/2018   HDL 73 01/30/2018   LDLCALC 42 01/30/2018   TRIG 106 01/30/2018      Wt Readings from Last 3 Encounters:  02/07/18 148 lb 9.4 oz (67.4 kg)  01/29/18 153 lb 14.1 oz (69.8 kg)  04/15/17 137 lb 5.6 oz (  62.3 kg)       ASSESSMENT AND PLAN:  No diagnosis found.   Disposition:   F/U  6 months  No orders of the defined types were placed in this encounter.    Signed, Esmond Plants, M.D., Ph.D. 02/07/2018  [place], [place]
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
## 2018-02-07 NOTE — Progress Notes (Signed)
Verified with Burman Nieves, NP and Dr. Celesta Aver, ok to give scheduled Heparin dose this morning. Hgb 10.3 and PT/INR 13/0.99

## 2018-02-08 ENCOUNTER — Inpatient Hospital Stay: Payer: Self-pay

## 2018-02-08 DIAGNOSIS — E119 Type 2 diabetes mellitus without complications: Secondary | ICD-10-CM

## 2018-02-08 DIAGNOSIS — Z794 Long term (current) use of insulin: Secondary | ICD-10-CM

## 2018-02-08 DIAGNOSIS — J9601 Acute respiratory failure with hypoxia: Secondary | ICD-10-CM

## 2018-02-08 LAB — BLOOD GAS, ARTERIAL
ACID-BASE EXCESS: 5.5 mmol/L — AB (ref 0.0–2.0)
Bicarbonate: 29.9 mmol/L — ABNORMAL HIGH (ref 20.0–28.0)
DELIVERY SYSTEMS: POSITIVE
FIO2: 0.7
Mode: POSITIVE
O2 SAT: 91.6 %
PCO2 ART: 42 mmHg (ref 32.0–48.0)
Patient temperature: 37
pH, Arterial: 7.46 — ABNORMAL HIGH (ref 7.350–7.450)
pO2, Arterial: 59 mmHg — ABNORMAL LOW (ref 83.0–108.0)

## 2018-02-08 LAB — BLOOD GAS, VENOUS
BICARBONATE: UNDETERMINED mmol/L (ref 20.0–28.0)
FIO2: 0.21
O2 SAT: UNDETERMINED %
PATIENT TEMPERATURE: 37
pCO2, Ven: 19 mmHg — CL (ref 44.0–60.0)
pO2, Ven: 73 mmHg — ABNORMAL HIGH (ref 32.0–45.0)

## 2018-02-08 LAB — BASIC METABOLIC PANEL
ANION GAP: 8 (ref 5–15)
BUN: 9 mg/dL (ref 6–20)
CALCIUM: 8.4 mg/dL — AB (ref 8.9–10.3)
CO2: 29 mmol/L (ref 22–32)
Chloride: 100 mmol/L (ref 98–111)
Creatinine, Ser: 0.66 mg/dL (ref 0.44–1.00)
GFR calc Af Amer: >60 mL/min (ref >60)
Glucose, Bld: 119 mg/dL — ABNORMAL HIGH (ref 70–99)
POTASSIUM: 4.1 mmol/L (ref 3.5–5.1)
SODIUM: 137 mmol/L (ref 135–145)

## 2018-02-08 LAB — GLUCOSE, CAPILLARY
GLUCOSE-CAPILLARY: 109 mg/dL — AB (ref 70–99)
GLUCOSE-CAPILLARY: 112 mg/dL — AB (ref 70–99)
GLUCOSE-CAPILLARY: 140 mg/dL — AB (ref 70–99)
GLUCOSE-CAPILLARY: 170 mg/dL — AB (ref 70–99)
GLUCOSE-CAPILLARY: 219 mg/dL — AB (ref 70–99)
Glucose-Capillary: 110 mg/dL — ABNORMAL HIGH (ref 70–99)
Glucose-Capillary: 142 mg/dL — ABNORMAL HIGH (ref 70–99)

## 2018-02-08 MED ORDER — INSULIN ASPART 100 UNIT/ML ~~LOC~~ SOLN
0.0000 [IU] | Freq: Three times a day (TID) | SUBCUTANEOUS | Status: DC
  Administered 2018-02-08: 3 [IU] via SUBCUTANEOUS
  Administered 2018-02-08: 5 [IU] via SUBCUTANEOUS
  Administered 2018-02-09: 3 [IU] via SUBCUTANEOUS
  Administered 2018-02-09: 8 [IU] via SUBCUTANEOUS
  Administered 2018-02-10: 5 [IU] via SUBCUTANEOUS
  Administered 2018-02-10: 11 [IU] via SUBCUTANEOUS
  Administered 2018-02-10: 5 [IU] via SUBCUTANEOUS
  Administered 2018-02-11: 8 [IU] via SUBCUTANEOUS
  Administered 2018-02-11: 5 [IU] via SUBCUTANEOUS
  Administered 2018-02-11: 15 [IU] via SUBCUTANEOUS
  Filled 2018-02-08 (×10): qty 1

## 2018-02-08 MED ORDER — IPRATROPIUM-ALBUTEROL 0.5-2.5 (3) MG/3ML IN SOLN: 3.0000 mL | Freq: Four times a day (QID) | RESPIRATORY_TRACT | Status: DC | PRN

## 2018-02-08 MED ORDER — INSULIN GLARGINE 100 UNIT/ML ~~LOC~~ SOLN
30.0000 [IU] | Freq: Every day | SUBCUTANEOUS | Status: DC
  Administered 2018-02-08 – 2018-02-09 (×2): 30 [IU] via SUBCUTANEOUS
  Filled 2018-02-08 (×3): qty 0.3

## 2018-02-08 MED ORDER — INSULIN ASPART 100 UNIT/ML ~~LOC~~ SOLN
0.0000 [IU] | Freq: Every day | SUBCUTANEOUS | Status: DC
  Administered 2018-02-09: 4 [IU] via SUBCUTANEOUS
  Administered 2018-02-10: 5 [IU] via SUBCUTANEOUS
  Filled 2018-02-08 (×2): qty 1

## 2018-02-08 NOTE — Progress Notes (Signed)
Pt stable, alert and oriented, and SR on tele. Pts o2 sats 87- 94% on high flow oxygen. Pt sat up in the chair most of the day. Pt now resting comfortably after treating HA and nausea. Will continue to monitor.

## 2018-02-08 NOTE — Progress Notes (Signed)
Pharmacy Antibiotic Note  Michaela Johnson is a 57 y.o. female admitted on 02/03/2018 with DKA.  Pharmacy has been consulted for Unasyn dosing for possible aspiration pneumonia.  Plan: Continue Unasyn 3g IV Q6hr.    Height: 5\' 2"  (157.5 cm) Weight: 144 lb 6.4 oz (65.5 kg) IBW/kg (Calculated) : 50.1  Temp (24hrs), Avg:100.1 F (37.8 C), Min:99.2 F (37.3 C), Max:100.9 F (38.3 C)  Recent Labs  Lab 02/03/18 2219  02/04/18 0448  02/04/18 1200 02/04/18 1553 02/05/18 0317 02/07/18 0535 02/08/18 0418  WBC 23.6*  --  24.4*  --   --   --  19.8* 11.4*  --   CREATININE 1.96*   < > 2.02*   < > 1.48* 1.29* 0.84 0.64 0.66   < > = values in this interval not displayed.    Estimated Creatinine Clearance: 68.1 mL/min (by C-G formula based on SCr of 0.66 mg/dL).    No Known Allergies  Antimicrobials this admission: Unasyn 7/18 >>   Dose adjustments this admission: N/A  Microbiology results: 7/18 BCx: NGTD 7/18 UCx: no growth  7/18 MRSA PCR: negative   Thank you for allowing pharmacy to be a part of this patient's care.  Ulice Dash D 02/08/2018 12:50 PM

## 2018-02-08 NOTE — Progress Notes (Signed)
No distress Cognition intact Remains on HF [place] No new complaints  Vitals:   02/08/18 0200 02/08/18 0400 02/08/18 0500 02/08/18 0727  BP: 130/66 136/70 130/67   Pulse: 84 89 89   Resp: (!) 25 (!) 25 (!) 28   Temp: 99.9 F (37.7 C)     TempSrc: Oral     SpO2: 98% 93% 95% 96%  Weight:   144 lb 6.4 oz (65.5 kg)   Height:         Gen: WDWN in NAD HEENT: NCAT, sclerae white, oropharynx normal Neck: No LAN, no JVD noted Lungs: Bilateral crackles, no wheezes Cardiovascular: Regular, normal rate, no M noted Abdomen: Soft, NT, +BS Ext: no C/C/E Neuro: PERRL, EOMI, motor/sensory grossly intact Skin: No lesions noted   BMP Latest Ref Rng & Units 02/08/2018 02/07/2018 02/05/2018  Glucose 70 - 99 mg/dL 119(H) 115(H) 145(H)  BUN 6 - 20 mg/dL 9 9 22(H)  Creatinine 0.44 - 1.00 mg/dL 0.66 0.64 0.84  Sodium 135 - 145 mmol/L 137 136 138  Potassium 3.5 - 5.1 mmol/L 4.1 3.6 3.9  Chloride 98 - 111 mmol/L 100 99 101  CO2 22 - 32 mmol/L 29 29 25   Calcium 8.9 - 10.3 mg/dL 8.4(L) 8.2(L) 7.4(L)   CBC Latest Ref Rng & Units 02/07/2018 02/05/2018 02/04/2018  WBC 3.6 - 11.0 K/uL 11.4(H) 19.8(H) 24.4(H)  Hemoglobin 12.0 - 16.0 g/dL 10.3(L) 11.6(L) 10.4(L)  Hematocrit 35.0 - 47.0 % 29.9(L) 33.5(L) 33.0(L)  Platelets 150 - 440 K/uL 233 310 332   CXR: Diffuse interstitial prominence with confluence in both bases  IMPRESSION: DKA, resolved IDDM Acute hypoxemic respiratory failure Suspect aspiration pneumonia Alcohol abuse  PLAN/REC: Continue Lantus, SSI Continue supplemental oxygen.  Wean as able Continue ampicillin-sulbactam.  Complete 7 days total Continue CIWA protocol SDU until FiO2 <50%  Merton Border, MD PCCM service Mobile [phone] Pager [phone] 02/08/2018 1:29 PM

## 2018-02-08 NOTE — Evaluation (Signed)
Occupational Therapy Evaluation Patient Details Name: Michaela Johnson MRN: [id number] DOB: January 15, 1961 Today's Date: 02/08/2018    History of Present Illness 57 year old female patient with history of diabetes mellitus, alcohol abuse, GERD, hypertension, tobacco abuse who came to ED on 7/17 and now on stepdown unit for diabetic ketoacidosis, hypoxia.   Clinical Impression   Pt seen for OT evaluation this date. Prior to hospital admission, pt was independent with mobility and ADL/IADL, except does not drive.  Pt lives with her spouse and a cat in a 1 story home with level entry.  Currently pt demonstrates impairments in cardiopulmonary status which is impairing her ability to participate in mobility tasks this date (per RN, bed level evaluation only this date) and ADL, requiring min-mod assist for LB ADL. Will continue mobility assessment/efforts next session. In bed, HR 88-89, BP 145/68, and O2 sats on HFNC ranging from 85-91% with improvement noted with education/encouragement to utilize pursed lip breathing. Pt would benefit from skilled OT services to address noted impairments and functional limitations (see below for any additional details) in order to maximize safety and independence while minimizing falls risk and caregiver burden, including AE/DME, falls prevention, chronic disease mgt, and energy conservation strategies.  Upon hospital discharge, recommend pt discharge to STR, however, anticipate with improvement in cardiopulmonary status, pt's functional status may improve. Will continue to assess for appropriateness of home with [facility] services.     Follow Up Recommendations  SNF    Equipment Recommendations  Other (comment)(TBD)    Recommendations for Other Services       Precautions / Restrictions Precautions Precautions: Fall Restrictions Weight Bearing Restrictions: No      Mobility Bed Mobility               General bed mobility comments: deferred, bed level eval this  date, per RN 2/2 HFNC use/respiratory status  Transfers                 General transfer comment: deferred, bed level eval this date, per RN 2/2 HFNC use/respiratory status    Balance                                           ADL either performed or assessed with clinical judgement   ADL Overall ADL's : Needs assistance/impaired Eating/Feeding: Bed level;Independent   Grooming: Bed level;Independent   Upper Body Bathing: Sitting;Min guard   Lower Body Bathing: Sitting/lateral leans;Moderate assistance;Minimal assistance   Upper Body Dressing : Sitting;Min guard   Lower Body Dressing: Sitting/lateral leans;Moderate assistance;Minimal assistance                       Vision Baseline Vision/History: Wears glasses Wears Glasses: At all times Patient Visual Report: No change from baseline       Perception     Praxis      Pertinent Vitals/Pain Pain Assessment: Faces Faces Pain Scale: Hurts even more Pain Location: chronic back pain, hurts with movement Pain Descriptors / Indicators: Aching;Grimacing Pain Intervention(s): Limited activity within patient's tolerance;Monitored during session;Repositioned     Hand Dominance     Extremity/Trunk Assessment Upper Extremity Assessment Upper Extremity Assessment: Generalized weakness(grossly at least 4-/5)   Lower Extremity Assessment Lower Extremity Assessment: Generalized weakness(grossly 4-/5 bilaterally)   Cervical / Trunk Assessment Cervical / Trunk Assessment: Normal   Communication Communication Communication: No difficulties   Cognition  Arousal/Alertness: Awake/alert Behavior During Therapy: WFL for tasks assessed/performed Overall Cognitive Status: Within Functional Limits for tasks assessed                                 General Comments: alert and oriented, follows all commands   General Comments       Exercises Other Exercises Other Exercises: pt  educated in principles of energy conservation, will benefit from additional training/education to support recall and carryover into daily routines   Shoulder Instructions      Home Living Family/patient expects to be discharged to:: Private residence Living Arrangements: Spouse/significant other Available Help at Discharge: Available 24 hours/day;Family Type of Home: House Home Access: Level entry     Home Layout: One level     Bathroom Shower/Tub: Tub/shower unit;Curtain   Biochemist, clinical: Standard     Home Equipment: None          Prior Functioning/Environment Level of Independence: Independent        Comments: Pt indep with mobility and ADL, does not drive        OT Problem List: Decreased strength;Decreased knowledge of use of DME or AE;Cardiopulmonary status limiting activity;Decreased activity tolerance      OT Treatment/Interventions: Self-care/ADL training;Therapeutic exercise;Therapeutic activities;Energy conservation;DME and/or AE instruction;Patient/family education    OT Goals(Current goals can be found in the care plan section) Acute Rehab OT Goals Patient Stated Goal: to get better and go home OT Goal Formulation: With patient Time For Goal Achievement: 02/22/18 Potential to Achieve Goals: Good ADL Goals Pt Will Perform Lower Body Dressing: with modified independence;sit to/from stand;with adaptive equipment Pt Will Transfer to Toilet: with supervision;ambulating(LRAD for amb, comfort height toilet) Additional ADL Goal #1: Pt will verbalize plan to implement at least 2 learned energy conservation strategies to maximize safety and independence in the home.  OT Frequency: Min 1X/week   Barriers to D/C:            Co-evaluation              AM-PAC PT "6 Clicks" Daily Activity     Outcome Measure Help from another person eating meals?: None Help from another person taking care of personal grooming?: None Help from another person toileting,  which includes using toliet, bedpan, or urinal?: A Little Help from another person bathing (including washing, rinsing, drying)?: A Little Help from another person to put on and taking off regular upper body clothing?: A Little Help from another person to put on and taking off regular lower body clothing?: A Little 6 Click Score: 20   End of Session    Activity Tolerance: Patient tolerated treatment well(mobility efforts limited 2/2 respiratory status) Patient left: in bed;with call bell/phone within reach  OT Visit Diagnosis: Other abnormalities of gait and mobility (R26.89);Muscle weakness (generalized) (M62.81)                Time: 6568-1275 OT Time Calculation (min): 14 min Charges:  OT General Charges $OT Visit: 1 Visit OT Evaluation $OT Eval Low Complexity: 1 Low  Jeni Salles, MPH, MS, OTR/L ascom [phone] 02/08/18, 12:04 PM

## 2018-02-08 NOTE — Progress Notes (Signed)
[place] at [place] NAME: Michaela Johnson    MR#:  [id number]  DATE OF BIRTH:  October 20, 1960  SUBJECTIVE:  CHIEF COMPLAINT:   Chief Complaint  Patient presents with  . Weakness  . Emesis  Patient seen and evaluated today On high flow oxygen Is off insulin drip No abdominal pain  no nausea and vomiting No fever and chills  REVIEW OF SYSTEMS:    ROS  CONSTITUTIONAL: No documented fever. No fatigue, weakness. No weight gain, no weight loss.  EYES: No blurry or double vision.  ENT: No tinnitus. No postnasal drip. No redness of the oropharynx.  RESPIRATORY: No cough, no wheeze, no hemoptysis.  Decreased dyspnea.  CARDIOVASCULAR: No chest pain. No orthopnea. No palpitations. No syncope.  GASTROINTESTINAL: Has nausea, Has no vomiting no diarrhea. Mild abdominal pain. No melena or hematochezia.  GENITOURINARY: No dysuria or hematuria.  ENDOCRINE: No polyuria or nocturia. No heat or cold intolerance.  HEMATOLOGY: No anemia. No bruising. No bleeding.  INTEGUMENTARY: No rashes. No lesions.  MUSCULOSKELETAL: No arthritis. No swelling. No gout.  NEUROLOGIC: No numbness, tingling, or ataxia. No seizure-type activity.  PSYCHIATRIC: No anxiety. No insomnia. No ADD.   DRUG ALLERGIES:  No Known Allergies  VITALS:  Blood pressure (!) 123/56, pulse 90, temperature 99.2 F (37.3 C), temperature source Axillary, resp. rate (!) 32, height 5\' 2"  (1.575 m), weight 65.5 kg (144 lb 6.4 oz), SpO2 (!) 87 %.  PHYSICAL EXAMINATION:   Physical Exam  GENERAL:  57 y.o.-year-old patient lying in the bed with no acute distress.  EYES: Pupils equal, round, reactive to light and accommodation. No scleral icterus. Extraocular muscles intact.  HEENT: Head atraumatic, normocephalic. Oropharynx and nasopharynx clear.  NECK:  Supple, no jugular venous distention. No thyroid enlargement, no tenderness.  LUNGS: Decreased breath sounds bilaterally, scattered Rales in both  lungs. No use of accessory muscles of respiration.  CARDIOVASCULAR: S1, S2 normal. No murmurs, rubs, or gallops.  ABDOMEN: Soft, mild tenderness around umbilicus, nondistended. Bowel sounds present. No organomegaly or mass.  EXTREMITIES: No cyanosis, clubbing or edema b/l.    NEUROLOGIC: Cranial nerves II through XII are intact. No focal Motor or sensory deficits b/l.   PSYCHIATRIC: The patient is alert and oriented x 3.  SKIN: No obvious rash, lesion, or ulcer.   LABORATORY PANEL:   CBC Recent Labs  Lab 02/07/18 0535  WBC 11.4*  HGB 10.3*  HCT 29.9*  PLT 233   ------------------------------------------------------------------------------------------------------------------ Chemistries  Recent Labs  Lab 02/05/18 0317  02/08/18 0418  NA 138   < > 137  K 3.9   < > 4.1  CL 101   < > 100  CO2 25   < > 29  GLUCOSE 145*   < > 119*  BUN 22*   < > 9  CREATININE 0.84   < > 0.66  CALCIUM 7.4*   < > 8.4*  MG 2.3  --   --   AST 40  --   --   ALT 27  --   --   ALKPHOS 103  --   --   BILITOT 0.8  --   --    < > = values in this interval not displayed.   ------------------------------------------------------------------------------------------------------------------  Cardiac Enzymes Recent Labs  Lab 02/04/18 0757  TROPONINI 0.12*   ------------------------------------------------------------------------------------------------------------------  RADIOLOGY:  Dg Chest Port 1 View  Result Date: 02/08/2018 CLINICAL DATA:  Aspiration pneumonia EXAM: PORTABLE CHEST 1 VIEW COMPARISON:  02/06/2018 FINDINGS: Cardiac shadow is within normal limits. The lungs are well aerated bilaterally. Diffuse interstitial changes are again identified bilaterally with persistent alveolar infiltrates stable from the previous exam. No new focal abnormality is noted. IMPRESSION: No change in mild alveolar edema and interstitial changes. Electronically Signed   By: Inez Catalina M.D.   On: 02/08/2018 07:03      ASSESSMENT AND PLAN:   57 year old female patient with history of diabetes mellitus, alcohol abuse, GERD, hypertension, tobacco abuse currently in stepdown unit for diabetic ketoacidosis, hypoxia  -Diabetic ketoacidosis resolved Off IV insulin drip On long-acting insulin and sliding scale coverage Monitor blood sugars closely  -Metabolic acidosis resolved  -Acute respiratory distress with hypoxia Fluid overload and aspiration pneumonia. -Oxygen via high flow Incentive spirometry IV Unasyn antibiotic Follow-up cultures  -Atrial fibrillation on diltiazem for rate control  -Aspiration pneumonitis On IV Unasyn antibiotic Follow-up cultures  -Tobacco abuse Tobacco cessation counseled to the patient for 6 minutes Nicotine patch offered  -Hyperkalemia resolved  -Alcohol abuse ciwa protocol Thiamine and folate supplements  -Elevated troponin secondary to demand ischemia  -Acute kidney injury secondary to dehydration resolved  All the records are reviewed and case discussed with Care Management/Social Worker. Management plans discussed with the patient, family and they are in agreement.  CODE STATUS: Full code  DVT Prophylaxis: SCDs  TOTAL TIME TAKING CARE OF THIS PATIENT: 34 minutes.   POSSIBLE D/C IN 2 to 3 DAYS, DEPENDING ON CLINICAL CONDITION.  [name] M.D on 02/08/2018 at 9:43 PM  Between 7am to 6pm - Pager - [phone]  After 6pm go to [email or web address] - password [email or web address] [facility]  [facility] [place]  Office  [phone]  CC: Primary care physician; [name], NP  Note: This dictation was prepared with Dragon dictation along with smaller phrase technology. Any transcriptional errors that result from this process are unintentional.

## 2018-02-08 NOTE — Progress Notes (Signed)
PT Cancellation Note  Patient Details Name: Michaela Johnson MRN: [id number] DOB: Dec 05, 1960   Cancelled Treatment:    Reason Eval/Treat Not Completed: Medical issues which prohibited therapy.  Pt continues to have issues related to HFNC and low blood O2 concentrations this morning.  Will reassess later for her readiness to attempt therapy.  Nursing is following this closely.   Ramond Dial 02/08/2018, 8:24 AM   Mee Hives, PT MS Acute Rehab Dept. Number: [place] and [place]

## 2018-02-09 DIAGNOSIS — J189 Pneumonia, unspecified organism: Secondary | ICD-10-CM

## 2018-02-09 LAB — GLUCOSE, CAPILLARY
GLUCOSE-CAPILLARY: 85 mg/dL (ref 70–99)
Glucose-Capillary: 78 mg/dL (ref 70–99)
Glucose-Capillary: 187 mg/dL — ABNORMAL HIGH (ref 70–99)
Glucose-Capillary: 259 mg/dL — ABNORMAL HIGH (ref 70–99)
Glucose-Capillary: 309 mg/dL — ABNORMAL HIGH (ref 70–99)

## 2018-02-09 LAB — CULTURE, BLOOD (ROUTINE X 2)
CULTURE: NO GROWTH
Culture: NO GROWTH
Special Requests: ADEQUATE

## 2018-02-09 MED ORDER — LORAZEPAM 0.5 MG PO TABS
0.5000 mg | ORAL_TABLET | ORAL | Status: DC | PRN
Start: 2018-02-09 — End: 2018-02-14

## 2018-02-09 MED ORDER — METHYLPREDNISOLONE SODIUM SUCC 40 MG IJ SOLR
40.0000 mg | Freq: Two times a day (BID) | INTRAMUSCULAR | Status: DC
  Administered 2018-02-09 – 2018-02-11 (×5): 40 mg via INTRAVENOUS
  Filled 2018-02-09 (×5): qty 1

## 2018-02-09 MED ORDER — POLYETHYLENE GLYCOL 3350 17 G PO PACK
17.0000 g | PACK | Freq: Every day | ORAL | Status: DC
  Administered 2018-02-09 – 2018-02-14 (×5): 17 g via ORAL
  Filled 2018-02-09 (×6): qty 1

## 2018-02-09 NOTE — Progress Notes (Signed)
[place] at [place] NAME: Michaela Johnson    MR#:  [id number]  DATE OF BIRTH:  December 21, 1960  SUBJECTIVE:  CHIEF COMPLAINT:   Chief Complaint  Patient presents with  . Weakness  . Emesis  Patient seen and evaluated today On high flow oxygen Is off insulin drip No abdominal pain  no nausea and vomiting No fever and chills  REVIEW OF SYSTEMS:    ROS  CONSTITUTIONAL: No documented fever. No fatigue, weakness. No weight gain, no weight loss.  EYES: No blurry or double vision.  ENT: No tinnitus. No postnasal drip. No redness of the oropharynx.  RESPIRATORY: No cough, no wheeze, no hemoptysis.  Decreased dyspnea.  CARDIOVASCULAR: No chest pain. No orthopnea. No palpitations. No syncope.  GASTROINTESTINAL: Has nausea, Has no vomiting no diarrhea. Mild abdominal pain. No melena or hematochezia.  GENITOURINARY: No dysuria or hematuria.  ENDOCRINE: No polyuria or nocturia. No heat or cold intolerance.  HEMATOLOGY: No anemia. No bruising. No bleeding.  INTEGUMENTARY: No rashes. No lesions.  MUSCULOSKELETAL: No arthritis. No swelling. No gout.  NEUROLOGIC: No numbness, tingling, or ataxia. No seizure-type activity.  PSYCHIATRIC: No anxiety. No insomnia. No ADD.   DRUG ALLERGIES:  No Known Allergies  VITALS:  Blood pressure 134/69, pulse 83, temperature 99.4 F (37.4 C), temperature source Oral, resp. rate (!) 26, height 5\' 2"  (1.575 m), weight 66.4 kg (146 lb 6.2 oz), SpO2 97 %.  PHYSICAL EXAMINATION:   Physical Exam  GENERAL:  57 y.o.-year-old patient lying in the bed with no acute distress.  EYES: Pupils equal, round, reactive to light and accommodation. No scleral icterus. Extraocular muscles intact.  HEENT: Head atraumatic, normocephalic. Oropharynx and nasopharynx clear.  NECK:  Supple, no jugular venous distention. No thyroid enlargement, no tenderness.  LUNGS: Decreased breath sounds bilaterally, scattered Rales in both lungs. No  use of accessory muscles of respiration.  CARDIOVASCULAR: S1, S2 normal. No murmurs, rubs, or gallops.  ABDOMEN: Soft, mild tenderness around umbilicus, nondistended. Bowel sounds present. No organomegaly or mass.  EXTREMITIES: No cyanosis, clubbing or edema b/l.    NEUROLOGIC: Cranial nerves II through XII are intact. No focal Motor or sensory deficits b/l.   PSYCHIATRIC: The patient is alert and oriented x 3.  SKIN: No obvious rash, lesion, or ulcer.   LABORATORY PANEL:   CBC Recent Labs  Lab 02/07/18 0535  WBC 11.4*  HGB 10.3*  HCT 29.9*  PLT 233   ------------------------------------------------------------------------------------------------------------------ Chemistries  Recent Labs  Lab 02/05/18 0317  02/08/18 0418  NA 138   < > 137  K 3.9   < > 4.1  CL 101   < > 100  CO2 25   < > 29  GLUCOSE 145*   < > 119*  BUN 22*   < > 9  CREATININE 0.84   < > 0.66  CALCIUM 7.4*   < > 8.4*  MG 2.3  --   --   AST 40  --   --   ALT 27  --   --   ALKPHOS 103  --   --   BILITOT 0.8  --   --    < > = values in this interval not displayed.   ------------------------------------------------------------------------------------------------------------------  Cardiac Enzymes Recent Labs  Lab 02/04/18 0757  TROPONINI 0.12*   ------------------------------------------------------------------------------------------------------------------  RADIOLOGY:  Dg Chest Port 1 View  Result Date: 02/08/2018 CLINICAL DATA:  Aspiration pneumonia EXAM: PORTABLE CHEST 1 VIEW COMPARISON:  02/06/2018  FINDINGS: Cardiac shadow is within normal limits. The lungs are well aerated bilaterally. Diffuse interstitial changes are again identified bilaterally with persistent alveolar infiltrates stable from the previous exam. No new focal abnormality is noted. IMPRESSION: No change in mild alveolar edema and interstitial changes. Electronically Signed   By: Inez Catalina M.D.   On: 02/08/2018 07:03      ASSESSMENT AND PLAN:   57 year old female patient with history of diabetes mellitus, alcohol abuse, GERD, hypertension, tobacco abuse currently in stepdown unit for diabetic ketoacidosis, hypoxia  -Diabetic ketoacidosis resolved Off IV insulin drip On long-acting insulin and sliding scale coverage Monitor blood sugars closely  -Metabolic acidosis resolved  -Acute respiratory distress with hypoxia Fluid overload and aspiration pneumonia. -Oxygen via high flow Incentive spirometry IV Unasyn antibiotic Follow-up cultures Pulmonary suggested to start on IV steroids.  -Atrial fibrillation on diltiazem for rate control  -Aspiration pneumonitis On IV Unasyn antibiotic Follow-up cultures  -Tobacco abuse Tobacco cessation counseled to the patient for 6 minutes Nicotine patch offered  -Hyperkalemia resolved  -Alcohol abuse ciwa protocol Thiamine and folate supplements  -Elevated troponin secondary to demand ischemia  -Acute kidney injury secondary to dehydration resolved  All the records are reviewed and case discussed with Care Management/Social Worker. Management plans discussed with the patient, family and they are in agreement.  CODE STATUS: Full code  DVT Prophylaxis: SCDs  TOTAL TIME TAKING CARE OF THIS PATIENT: 34 minutes.   POSSIBLE D/C IN 2 to 3 DAYS, DEPENDING ON CLINICAL CONDITION.  [name] M.D on 02/09/2018 at 6:10 PM  Between 7am to 6pm - Pager - [phone]  After 6pm go to [email or web address] - password [email or web address] [facility]  [facility] [place] [facility]  Office  [phone]  CC: Primary care physician; [name], NP  Note: This dictation was prepared with Dragon dictation along with smaller phrase technology. Any transcriptional errors that result from this process are unintentional.

## 2018-02-09 NOTE — Progress Notes (Signed)
Patient slept with bipap and on HFNC while awake. Denied pain and nausea this shift. No concerns at this time other than being unable to wean off oxygen since patient is not on oxygen at home.

## 2018-02-09 NOTE — Progress Notes (Signed)
Pt sat up in chair all day with no complaints, pt states she has been doing her incentive spirometer throughout the day as well. Pt demonstrated to RN twice. Pt tolerated 60% high flow o2 for an hour this afternoon. Pt now on 70% high flow o2, maintaining 94% and above. Pt now resting comfortably in bed, will continue to monitor.

## 2018-02-09 NOTE — Progress Notes (Signed)
Remains in no distress Cognition remains intact Still requiring HF [place] at 75% No new complaints  Vitals:   02/09/18 0700 02/09/18 0800 02/09/18 0900 02/09/18 1000  BP: (!) 125/95 134/70 137/68   Pulse: (!) 123 92 92 87  Resp: (!) 29 15 (!) 31 (!) 21  Temp:  99 F (37.2 C)    TempSrc:  Oral    SpO2: (!) 85%  (!) 83% 97%  Weight:      Height:      FiO2 75% by HF [place]  Gen: Cognition intact, NAD HEENT: NCAT, sclerae white, oropharynx normal Neck: No LAN, no JVD noted Lungs: No wheezes, bilateral crackles, bibasilar inspiratory squeaks Cardiovascular: Regular, no M noted Abdomen: Soft, NT, +BS Ext: no C/C/E Neuro: No focal deficit Skin: No lesions noted   BMP Latest Ref Rng & Units 02/08/2018 02/07/2018 02/05/2018  Glucose 70 - 99 mg/dL 119(H) 115(H) 145(H)  BUN 6 - 20 mg/dL 9 9 22(H)  Creatinine 0.44 - 1.00 mg/dL 0.66 0.64 0.84  Sodium 135 - 145 mmol/L 137 136 138  Potassium 3.5 - 5.1 mmol/L 4.1 3.6 3.9  Chloride 98 - 111 mmol/L 100 99 101  CO2 22 - 32 mmol/L 29 29 25   Calcium 8.9 - 10.3 mg/dL 8.4(L) 8.2(L) 7.4(L)   CBC Latest Ref Rng & Units 02/07/2018 02/05/2018 02/04/2018  WBC 3.6 - 11.0 K/uL 11.4(H) 19.8(H) 24.4(H)  Hemoglobin 12.0 - 16.0 g/dL 10.3(L) 11.6(L) 10.4(L)  Hematocrit 35.0 - 47.0 % 29.9(L) 33.5(L) 33.0(L)  Platelets 150 - 440 K/uL 233 310 332   CXR: No new film  IMPRESSION: DKA, resolved IDDM Severe acute hypoxemic respiratory failure Suspect aspiration pneumonia Diffuse pneumonitis pattern on CXR History of alcohol abuse, no evidence of withdrawal  PLAN/REC: Continue Lantus, SSI Continue supplemental oxygen.  Wean as able Continue ampicillin-sulbactam.  Complete 7 days total discontinue CIWA protocol Low-dose lorazepam as needed Initiate moderate dose systemic steroids 7/23 for pneumonitis Recheck CXR 7/24 Needs to remain in SDU until FiO2 requirements improve  Merton Border, MD PCCM service Mobile [phone] Pager [phone] 02/09/2018  11:50 AM

## 2018-02-09 NOTE — Progress Notes (Signed)
Pts o2 sats maintaining 98%. Respiratory turned down high flow o2 to 60%. Pt now maintaining 91%. Will continue to monitor.

## 2018-02-09 NOTE — Progress Notes (Signed)
Pt has not had a BM since 7/17, verbal order received for miralax daily. Pt given miralax, will continue to monitor.

## 2018-02-09 NOTE — Evaluation (Signed)
Physical Therapy Evaluation Patient Details Name: Michaela Johnson MRN: [id number] DOB: 01/11/1961 Today's Date: 02/09/2018   History of Present Illness  Pt is a 57 y.o. female presenting to hospital 02/03/18 with DKA, acute metabolic encephalopathy, hyperkalemia, pseudohyponatremia, elevated troponin secondary demand ischemia from DKA, acute renal failure, acute metabolic acidosis.  Pt with recent hospital admission for DKA 7/12-7/14.  PMH includes DM, alcohol use, depression, htn, paroxysmal a-fib, neuropathy, seizure, and tobacco use.  Clinical Impression  Prior to hospital admission, pt was independent with functional mobility/ambulation; no home O2 use.  Pt lives with her husband in 1 level home (level entry).  Currently pt is CGA to min assist with transfers and min assist marching in place x10 reps B LE's (x2 sets with sitting rest break between).  O2 sats 89% or greater during session's activities and O2 sats mid to upper 90's end of session on HFNC (50 L/min on 75% FiO2).  Pt demonstrating generalized weakness and requiring assist for balance with standing activities.  Pt would benefit from skilled PT to address noted impairments and functional limitations (see below for any additional details).  Upon hospital discharge, anticipate with improved respiratory status pt will be able to discharge home with HHPT and support of family.    Follow Up Recommendations Home health PT    Equipment Recommendations  Rolling walker with 5" wheels    Recommendations for Other Services       Precautions / Restrictions Precautions Precautions: Fall Restrictions Weight Bearing Restrictions: No      Mobility  Bed Mobility               General bed mobility comments: Deferred (pt sitting up in chair beginning and end of PT session)  Transfers Overall transfer level: Needs assistance Equipment used: None Transfers: Sit to/from Stand Sit to Stand: Min guard;Min assist         General  transfer comment: mild increased effort to stand; min assist to steady upon standing; assist for lines  Ambulation/Gait Ambulation/Gait assistance: Min assist Gait Distance (Feet): (pt marched in place 10 reps B LE's (x2 sets with sitting rest break between)) Assistive device: 1 person hand held assist   Gait velocity: decreased   General Gait Details: pt initially unsteady with posterior loss of balance requiring assist for balance (pt holding onto therapists arms for balance after that); decreased B foot clearance  Stairs            Wheelchair Mobility    Modified Rankin (Stroke Patients Only)       Balance Overall balance assessment: Needs assistance Sitting-balance support: No upper extremity supported;Feet supported Sitting balance-Leahy Scale: Normal Sitting balance - Comments: steady sitting reaching outside BOS   Standing balance support: No upper extremity supported;During functional activity Standing balance-Leahy Scale: Poor Standing balance comment: pt requires B UE support for balance with taking steps in place                             Pertinent Vitals/Pain Pain Assessment: 0-10 Pain Score: 0-No pain Pain Intervention(s): Limited activity within patient's tolerance;Monitored during session;Premedicated before session;Repositioned  HR WFL during session.    Home Living Family/patient expects to be discharged to:: Private residence Living Arrangements: Spouse/significant other Available Help at Discharge: Available 24 hours/day;Family Type of Home: House Home Access: Level entry     Home Layout: One level Home Equipment: None      Prior Function Level of Independence:  Independent         Comments: Pt independent with functional mobility and ADL's.  (-) driving.     Hand Dominance        Extremity/Trunk Assessment   Upper Extremity Assessment Upper Extremity Assessment: Generalized weakness    Lower Extremity  Assessment Lower Extremity Assessment: Generalized weakness    Cervical / Trunk Assessment Cervical / Trunk Assessment: Normal  Communication   Communication: No difficulties  Cognition Arousal/Alertness: Awake/alert Behavior During Therapy: WFL for tasks assessed/performed Overall Cognitive Status: Within Functional Limits for tasks assessed                                        General Comments General comments (skin integrity, edema, etc.): Pt resting in chair upon PT entering room.  Nursing cleared pt for participation in physical therapy.  Pt agreeable to PT session.    Exercises General Exercises - Lower Extremity Ankle Circles/Pumps: AROM;Strengthening;Both;10 reps;Seated Long Arc Quad: AROM;Strengthening;Both;10 reps;Seated Hip Flexion/Marching: AROM;Strengthening;Both;10 reps;Seated   Assessment/Plan    PT Assessment Patient needs continued PT services  PT Problem List Decreased strength;Decreased activity tolerance;Decreased balance;Decreased mobility;Decreased knowledge of use of DME;Decreased knowledge of precautions;Cardiopulmonary status limiting activity;Pain       PT Treatment Interventions DME instruction;Gait training;Functional mobility training;Therapeutic activities;Therapeutic exercise;Balance training;Patient/family education    PT Goals (Current goals can be found in the Care Plan section)  Acute Rehab PT Goals Patient Stated Goal: to get better and go home PT Goal Formulation: With patient Time For Goal Achievement: 02/23/18 Potential to Achieve Goals: Fair    Frequency Min 2X/week   Barriers to discharge        Co-evaluation               AM-PAC PT "6 Clicks" Daily Activity  Outcome Measure Difficulty turning over in bed (including adjusting bedclothes, sheets and blankets)?: A Little Difficulty moving from lying on back to sitting on the side of the bed? : A Lot Difficulty sitting down on and standing up from a chair  with arms (e.g., wheelchair, bedside commode, etc,.)?: Unable Help needed moving to and from a bed to chair (including a wheelchair)?: A Little Help needed walking in hospital room?: A Little Help needed climbing 3-5 steps with a railing? : A Lot 6 Click Score: 14    End of Session Equipment Utilized During Treatment: Other (comment)(HFNC) Activity Tolerance: Patient tolerated treatment well Patient left: in chair;with call bell/phone within reach Nurse Communication: Mobility status;Precautions;Other (comment)(Pt's O2 status during session) PT Visit Diagnosis: Unsteadiness on feet (R26.81);Other abnormalities of gait and mobility (R26.89);Muscle weakness (generalized) (M62.81)    Time: 0931-1216 PT Time Calculation (min) (ACUTE ONLY): 24 min   Charges:   PT Evaluation $PT Eval Low Complexity: 1 Low PT Treatments $Therapeutic Exercise: 8-22 mins   PT G CodesLeitha Bleak, PT 02/09/18, 1:42 PM [phone]

## 2018-02-10 ENCOUNTER — Inpatient Hospital Stay: Payer: Self-pay

## 2018-02-10 ENCOUNTER — Ambulatory Visit: Payer: Self-pay | Admitting: Cardiovascular Disease

## 2018-02-10 LAB — BASIC METABOLIC PANEL WITH GFR
Anion gap: 8 (ref 5–15)
BUN: 17 mg/dL (ref 6–20)
CO2: 30 mmol/L (ref 22–32)
Calcium: 8.7 mg/dL — ABNORMAL LOW (ref 8.9–10.3)
Chloride: 97 mmol/L — ABNORMAL LOW (ref 98–111)
Creatinine, Ser: 0.58 mg/dL (ref 0.44–1.00)
GFR calc Af Amer: >60 mL/min (ref >60)
GFR calc non Af Amer: >60 mL/min (ref >60)
Glucose, Bld: 273 mg/dL — ABNORMAL HIGH (ref 70–99)
Potassium: 4.5 mmol/L (ref 3.5–5.1)
Sodium: 135 mmol/L (ref 135–145)

## 2018-02-10 LAB — GLUCOSE, CAPILLARY
GLUCOSE-CAPILLARY: 282 mg/dL — AB (ref 70–99)
GLUCOSE-CAPILLARY: 324 mg/dL — AB (ref 70–99)
Glucose-Capillary: 231 mg/dL — ABNORMAL HIGH (ref 70–99)
Glucose-Capillary: 359 mg/dL — ABNORMAL HIGH (ref 70–99)

## 2018-02-10 LAB — CBC WITH DIFFERENTIAL/PLATELET
Basophils Absolute: 0.1 K/uL (ref 0–0.1)
Basophils Relative: 1 %
Eosinophils Absolute: 0 K/uL (ref 0–0.7)
Eosinophils Relative: 0 %
HCT: 31.2 % — ABNORMAL LOW (ref 35.0–47.0)
Hemoglobin: 10.6 g/dL — ABNORMAL LOW (ref 12.0–16.0)
Lymphocytes Relative: 4 %
Lymphs Abs: 0.5 K/uL — ABNORMAL LOW (ref 1.0–3.6)
MCH: 33.8 pg (ref 26.0–34.0)
MCHC: 34.1 g/dL (ref 32.0–36.0)
MCV: 99.3 fL (ref 80.0–100.0)
Monocytes Absolute: 0.4 K/uL (ref 0.2–0.9)
Monocytes Relative: 3 %
Neutro Abs: 11.3 K/uL — ABNORMAL HIGH (ref 1.4–6.5)
Neutrophils Relative %: 92 %
Platelets: 421 K/uL (ref 150–440)
RBC: 3.14 MIL/uL — ABNORMAL LOW (ref 3.80–5.20)
RDW: 14.1 % (ref 11.5–14.5)
WBC: 12.3 K/uL — ABNORMAL HIGH (ref 3.6–11.0)

## 2018-02-10 MED ORDER — LISINOPRIL 20 MG PO TABS
40.0000 mg | ORAL_TABLET | Freq: Every day | ORAL | Status: DC
  Administered 2018-02-10 – 2018-02-14 (×5): 40 mg via ORAL
  Filled 2018-02-10 (×6): qty 2

## 2018-02-10 MED ORDER — ENOXAPARIN SODIUM 40 MG/0.4ML ~~LOC~~ SOLN
40.0000 mg | SUBCUTANEOUS | Status: DC
  Administered 2018-02-10 – 2018-02-11 (×2): 40 mg via SUBCUTANEOUS
  Filled 2018-02-10 (×2): qty 0.4

## 2018-02-10 MED ORDER — INSULIN GLARGINE 100 UNIT/ML ~~LOC~~ SOLN
45.0000 [IU] | Freq: Every day | SUBCUTANEOUS | Status: DC
  Administered 2018-02-10: 45 [IU] via SUBCUTANEOUS
  Filled 2018-02-10 (×2): qty 0.45

## 2018-02-10 MED ORDER — HYDROCHLOROTHIAZIDE 12.5 MG PO CAPS
12.5000 mg | ORAL_CAPSULE | Freq: Every day | ORAL | Status: DC
  Administered 2018-02-10 – 2018-02-14 (×5): 12.5 mg via ORAL
  Filled 2018-02-10 (×5): qty 1

## 2018-02-10 NOTE — Progress Notes (Addendum)
Occupational Therapy Treatment Patient Details Name: Michaela Johnson MRN: [id number] DOB: [birth year] Today's Date: 02/10/2018    History of present illness Pt is a 57 y.o. female presenting to hospital 02/03/18 with DKA, acute metabolic encephalopathy, hyperkalemia, pseudohyponatremia, elevated troponin secondary demand ischemia from DKA, acute renal failure, acute metabolic acidosis.  Pt with recent hospital admission for DKA 7/12-7/14.  PMH includes DM, alcohol use, depression, htn, paroxysmal a-fib, neuropathy, seizure, and tobacco use.   OT comments  Pt seen for OT tx this date. Pt noting headache and chronic back pain at start of session. RN notified of pt's request for a ice pack for her back at end of session. Pt agreeable to therapy, requests assist for brief change after having urinated. From the recliner, pt was able to doff the wet brief by lifting hips off the recliner seat and performed thorough hygiene while seated with set up of moist wipes from OT. Pt educated in energy conservation strategies including AE/DME, work simplification, home/routines modifications, activity pacing and pursed lip breathing to support independence/safety with mobility and ADL while minimizing over exertion/SOB. Handout provided. Pt discussed her love of gardening and OT facilitated problem solving and incorporation of energy conservation strategies into the occupation of gardening to improve pt's ability to participate while maximizing safety. Pt verbalized understanding of all education/training provided this date and would benefit from continued skilled OT services to support implementation and carryover of learned techniques into daily ADL and mobility tasks to support improved independence and safety in order to return home. Pt became tearful near end of session, stating that she was upset because her spouse hasn't been by to visit much. OT provided emotional support, active listening, and encouraged deep  breathing to minimize SOB and support her in stress mgt. Pt was very appreciative and able to return demo learned deep breathing techniques. Pt requested OT ask RN to invite chaplain services to see pt. RN notified.  Based on pt's progress towards goals and improved cardiopulmonary status supporting improved mobility and ADL performance, discharge recommendation has been updated to home with [facility] OT services.    Follow Up Recommendations  Home health OT    Equipment Recommendations  Other (comment);3 in 1 bedside commode(reacher, LH shoe horn)    Recommendations for Other Services      Precautions / Restrictions Precautions Precautions: Fall Restrictions Weight Bearing Restrictions: No       Mobility Bed Mobility               General bed mobility comments: deferred, pt up in recliner at start and end of OT session  Transfers Overall transfer level: Needs assistance Equipment used: Rolling walker (2 wheeled) Transfers: Sit to/from Stand Sit to Stand: Min guard         General transfer comment: RW there for support as needed but pt able to perform with CGA without overt LOB or difficulty    Balance Overall balance assessment: Needs assistance Sitting-balance support: No upper extremity supported;Feet supported Sitting balance-Leahy Scale: Normal     Standing balance support: No upper extremity supported;During functional activity Standing balance-Leahy Scale: Fair                             ADL either performed or assessed with clinical judgement   ADL Overall ADL's : Needs assistance/impaired                     Lower Body  Dressing: Sit to/from stand;Minimal assistance;Min guard Lower Body Dressing Details (indicate cue type and reason): pt instructed in AE for LB dressing to minimize energy expenditure and SOB; min assist to initiate donning of tab style brief over feet to minimize need to bend over with pt able to complete donning over  hips in standing with CGA     Toileting- Clothing Manipulation and Hygiene: Set up;Sitting/lateral lean Toileting - Clothing Manipulation Details (indicate cue type and reason): While seated in recliner, OT provided pt with moist wipes and she was able to perform thorough hygiene from a seated position without assist             Vision Baseline Vision/History: Wears glasses Wears Glasses: At all times(does not have them with her) Patient Visual Report: No change from baseline     Perception     Praxis      Cognition Arousal/Alertness: Awake/alert Behavior During Therapy: WFL for tasks assessed/performed Overall Cognitive Status: Within Functional Limits for tasks assessed                                          Exercises Other Exercises Other Exercises: pt educated in energy conservation strategies including AE/DME, work simplification, home/routines modifications, and activity pacing as well as pursed lip breathing to support independence/safety with mobility and ADL while minimizing over exertion/SOB.    Shoulder Instructions       General Comments      Pertinent Vitals/ Pain       Pain Assessment: Faces Faces Pain Scale: Hurts even more Pain Location: chronic back pain Pain Descriptors / Indicators: Aching;Grimacing Pain Intervention(s): Limited activity within patient's tolerance;Other (comment);Repositioned;Monitored during session(pt requesting ice pack for back, RN notified)  Home Living                                          Prior Functioning/Environment              Frequency  Min 1X/week        Progress Toward Goals  OT Goals(current goals can now be found in the care plan section)  Progress towards OT goals: Progressing toward goals  Acute Rehab OT Goals Patient Stated Goal: to get better and go home OT Goal Formulation: With patient Time For Goal Achievement: 02/22/18 Potential to Achieve Goals:  Good  Plan Discharge plan needs to be updated;Frequency remains appropriate    Co-evaluation                 AM-PAC PT "6 Clicks" Daily Activity     Outcome Measure   Help from another person eating meals?: None Help from another person taking care of personal grooming?: None Help from another person toileting, which includes using toliet, bedpan, or urinal?: A Little Help from another person bathing (including washing, rinsing, drying)?: A Little Help from another person to put on and taking off regular upper body clothing?: None Help from another person to put on and taking off regular lower body clothing?: A Little 6 Click Score: 21    End of Session Equipment Utilized During Treatment: Gait belt;Rolling walker;Oxygen  OT Visit Diagnosis: Other abnormalities of gait and mobility (R26.89);Muscle weakness (generalized) (M62.81)   Activity Tolerance Patient tolerated treatment well   Patient Left in chair;with call  bell/phone within reach   Nurse Communication Other (comment)(ice pack and wants to speak with a Chaplain)        Time: 3953-2023 OT Time Calculation (min): 31 min  Charges: OT General Charges $OT Visit: 1 Visit OT Treatments $Self Care/Home Management : 23-37 mins  Jeni Salles, MPH, MS, OTR/L ascom [phone] 02/10/18, 3:02 PM

## 2018-02-10 NOTE — Progress Notes (Signed)
Overall, no changes  remains in no distress Cognition remains intact Still requiring HF [place] now at 55 % No new complaints  Vitals:   02/10/18 1200 02/10/18 1300 02/10/18 1400 02/10/18 1500  BP: (!) 153/80 135/70 (!) 146/125   Pulse: 97 94 92 94  Resp: (!) 23 16 18 12   Temp:   98.7 F (37.1 C)   TempSrc:   Oral   SpO2: 96% (!) 87% 92% 98%  Weight:      Height:      FiO2 75% by HF [place]  Gen: Cognition intact, NAD HEENT: NCAT, sclerae white, oropharynx normal Neck: No LAN, no JVD noted Lungs: No wheezes, bilateral crackles, bibasilar inspiratory squeaks Cardiovascular: Regular, no M noted Abdomen: Soft, NT, +BS Ext: no C/C/E Neuro: No focal deficit Skin: No lesions noted   BMP Latest Ref Rng & Units 02/10/2018 02/08/2018 02/07/2018  Glucose 70 - 99 mg/dL 273(H) 119(H) 115(H)  BUN 6 - 20 mg/dL 17 9 9   Creatinine 0.44 - 1.00 mg/dL 0.58 0.66 0.64  Sodium 135 - 145 mmol/L 135 137 136  Potassium 3.5 - 5.1 mmol/L 4.5 4.1 3.6  Chloride 98 - 111 mmol/L 97(L) 100 99  CO2 22 - 32 mmol/L 30 29 29   Calcium 8.9 - 10.3 mg/dL 8.7(L) 8.4(L) 8.2(L)   CBC Latest Ref Rng & Units 02/10/2018 02/07/2018 02/05/2018  WBC 3.6 - 11.0 K/uL 12.3(H) 11.4(H) 19.8(H)  Hemoglobin 12.0 - 16.0 g/dL 10.6(L) 10.3(L) 11.6(L)  Hematocrit 35.0 - 47.0 % 31.2(L) 29.9(L) 33.5(L)  Platelets 150 - 440 K/uL 421 233 310   CXR: Diffuse interstitial prominence.  Improved aeration in both bases.  IMPRESSION: DKA, resolved IDDM, worsened control on systemic steroids Severe acute hypoxemic respiratory failure Suspect aspiration pneumonia Diffuse pneumonitis pattern on CXR History of alcohol abuse, no evidence of withdrawal  PLAN/REC: Continue Lantus, SSI.  Dose of Lantus increased to 7/24 Continue supplemental oxygen to maintain SPO2 >88%.  Wean as able Continue ampicillin-sulbactam.  Complete 7 days total.  Last day today Continue low-dose lorazepam as needed Continue moderate dose systemic steroids -initiated 7/23  for pneumonitis Needs to remain in SDU until FiO2 requirements improve  Merton Border, MD PCCM service Mobile [phone] Pager [phone] 02/10/2018 4:03 PM

## 2018-02-10 NOTE — Progress Notes (Signed)
Inpatient Diabetes Program Recommendations  AACE/ADA: New Consensus Statement on Inpatient Glycemic Control (2019)  Target Ranges:  Prepandial:   less than 140 mg/dL      Peak postprandial:   less than 180 mg/dL (1-2 hours)      Critically ill patients:  140 - 180 mg/dL   Results for Michaela Johnson, Michaela Johnson (MRN [id number]) as of 02/10/2018 08:09  Ref. Range 02/09/2018 07:45 02/09/2018 11:57 02/09/2018 16:06 02/09/2018 22:01 02/10/2018 07:39  Glucose-Capillary Latest Ref Range: 70 - 99 mg/dL 85 187 (H) 259 (H) 309 (H) 231 (H)   Review of Glycemic Control  Diabetes history: DM2 Outpatient Diabetes medications: Lantus 50 units QHS Current orders for Inpatient glycemic control: Lantus 30 units QHS, Novolog 15 units TID with meals, Novolog 0-5 units QHS; Solumedrol 40 mg Q12H  Inpatient Diabetes Program Recommendations:  Insulin - Basal: If steroids are continued, please consider increasing Lantus to 35 units QHS. Insulin - Meal Coverage: Please consider ordering Novolog 3 units TID with meals for meal coverage if patient eats at least 50% of meals.  Thanks, Barnie Alderman, RN, MSN, CDE Diabetes Coordinator Inpatient Diabetes Program [phone] (Team Pager from 8am to 5pm)

## 2018-02-10 NOTE — Progress Notes (Signed)
Pt c/o constipation with no BM since 7/17, denies pain but request laxative. PRN Dulcolax tablet given per PRN order and pt request. Will continue to monitor.

## 2018-02-10 NOTE — Progress Notes (Signed)
[place] at [place] NAME: Michaela Johnson    MR#:  [id number]  DATE OF BIRTH:  01-15-1961  SUBJECTIVE:  CHIEF COMPLAINT:   Chief Complaint  Patient presents with  . Weakness  . Emesis  Patient seen and evaluated today On high flow oxygen Is off insulin drip No abdominal pain  no nausea and vomiting No fever and chills.  feels better.  REVIEW OF SYSTEMS:    ROS  CONSTITUTIONAL: No documented fever. No fatigue, weakness. No weight gain, no weight loss.  EYES: No blurry or double vision.  ENT: No tinnitus. No postnasal drip. No redness of the oropharynx.  RESPIRATORY: No cough, no wheeze, no hemoptysis.  Decreased dyspnea.  CARDIOVASCULAR: No chest pain. No orthopnea. No palpitations. No syncope.  GASTROINTESTINAL: Has nausea, Has no vomiting no diarrhea. Mild abdominal pain. No melena or hematochezia.  GENITOURINARY: No dysuria or hematuria.  ENDOCRINE: No polyuria or nocturia. No heat or cold intolerance.  HEMATOLOGY: No anemia. No bruising. No bleeding.  INTEGUMENTARY: No rashes. No lesions.  MUSCULOSKELETAL: No arthritis. No swelling. No gout.  NEUROLOGIC: No numbness, tingling, or ataxia. No seizure-type activity.  PSYCHIATRIC: No anxiety. No insomnia. No ADD.   DRUG ALLERGIES:  No Known Allergies  VITALS:  Blood pressure (!) 146/125, pulse 94, temperature 98.7 F (37.1 C), temperature source Oral, resp. rate 12, height 5\' 2"  (1.575 m), weight 67.2 kg (148 lb 2.4 oz), SpO2 98 %.  PHYSICAL EXAMINATION:   Physical Exam  GENERAL:  57 y.o.-year-old patient lying in the bed with no acute distress.  EYES: Pupils equal, round, reactive to light and accommodation. No scleral icterus. Extraocular muscles intact.  HEENT: Head atraumatic, normocephalic. Oropharynx and nasopharynx clear.  NECK:  Supple, no jugular venous distention. No thyroid enlargement, no tenderness.  LUNGS: Decreased breath sounds bilaterally, scattered Rales in  both lungs. No use of accessory muscles of respiration. On HFNC. CARDIOVASCULAR: S1, S2 normal. No murmurs, rubs, or gallops.  ABDOMEN: Soft, mild tenderness around umbilicus, nondistended. Bowel sounds present. No organomegaly or mass.  EXTREMITIES: No cyanosis, clubbing or edema b/l.    NEUROLOGIC: Cranial nerves II through XII are intact. No focal Motor or sensory deficits b/l.   PSYCHIATRIC: The patient is alert and oriented x 3.  SKIN: No obvious rash, lesion, or ulcer.   LABORATORY PANEL:   CBC Recent Labs  Lab 02/10/18 0609  WBC 12.3*  HGB 10.6*  HCT 31.2*  PLT 421   ------------------------------------------------------------------------------------------------------------------ Chemistries  Recent Labs  Lab 02/05/18 0317  02/10/18 0609  NA 138   < > 135  K 3.9   < > 4.5  CL 101   < > 97*  CO2 25   < > 30  GLUCOSE 145*   < > 273*  BUN 22*   < > 17  CREATININE 0.84   < > 0.58  CALCIUM 7.4*   < > 8.7*  MG 2.3  --   --   AST 40  --   --   ALT 27  --   --   ALKPHOS 103  --   --   BILITOT 0.8  --   --    < > = values in this interval not displayed.   ------------------------------------------------------------------------------------------------------------------  Cardiac Enzymes Recent Labs  Lab 02/04/18 0757  TROPONINI 0.12*   ------------------------------------------------------------------------------------------------------------------  RADIOLOGY:  Dg Chest Port 1 View  Result Date: 02/10/2018 CLINICAL DATA:  Respiratory failure EXAM: PORTABLE CHEST 1  VIEW COMPARISON:  February 08, 2018 and April 15, 2017 FINDINGS: Diffuse interstitial edema remains. There is less consolidation in the left lower lobe compared to recent study. Patchy alveolar opacity on the right inferiorly is stable. Heart is upper normal in size with pulmonary vascularity normal. No adenopathy. No bone lesions. IMPRESSION: Widespread interstitial edema. Patchy alveolar opacity in the  bases with partial clearing on the left and no change on the right. No new opacity evident. Stable cardiac silhouette. Electronically Signed   By: Lowella Grip III M.D.   On: 02/10/2018 07:12     ASSESSMENT AND PLAN:   57 year old female patient with history of diabetes mellitus, alcohol abuse, GERD, hypertension, tobacco abuse currently in stepdown unit for diabetic ketoacidosis, hypoxia  -Diabetic ketoacidosis resolved Off IV insulin drip On long-acting insulin and sliding scale coverage Monitor blood sugars closely  -Metabolic acidosis resolved  -Acute respiratory distress with hypoxia Fluid overload and aspiration pneumonia. -Oxygen via high flow Incentive spirometry IV Unasyn antibiotic- give total 7 days course. Follow-up cultures Pulmonary suggested to start on IV steroids.  -Atrial fibrillation on diltiazem for rate control  -Aspiration pneumonitis On IV Unasyn antibiotic Follow-up cultures  -Tobacco abuse Tobacco cessation counseled to the patient for 6 minutes Nicotine patch offered  -Hyperkalemia resolved  -Alcohol abuse ciwa protocol Thiamine and folate supplements  -Elevated troponin secondary to demand ischemia  -Acute kidney injury secondary to dehydration resolved  All the records are reviewed and case discussed with Care Management/Social Worker. Management plans discussed with the patient, family and they are in agreement.  CODE STATUS: Full code  DVT Prophylaxis: SCDs  TOTAL TIME TAKING CARE OF THIS PATIENT: 34 minutes.   POSSIBLE D/C IN 2 to 3 DAYS, DEPENDING ON CLINICAL CONDITION.  [name] M.D on 02/10/2018 at 3:54 PM  Between 7am to 6pm - Pager - [phone]  After 6pm go to [email or web address] - password [email or web address] [facility]  [facility] [place] [facility]  Office  [phone]  CC: Primary care physician; [name], NP  Note: This dictation was prepared with Dragon dictation along with smaller phrase technology. Any  transcriptional errors that result from this process are unintentional.

## 2018-02-10 NOTE — Progress Notes (Signed)
   02/10/18 1530  Clinical Encounter Type  Visited With Patient  Visit Type Initial  Referral From Chaplain  Consult/Referral To [place]   Unit chaplain responded to call from on-call chaplain regarding patient support.  Upon arrival, patient became tearful and asked chaplain to pray for her.  Chaplain asked what patient wanted prayer for; she shared emotional concerns which chaplain folded into prayer, asking for God's love and support in holding and expressing the emotions.  Patient shared thoughts and feelings regarding relationships and support.  She reported that spouse has traumatic brain injury and expressed how that impacts their relationship.  Patient and chaplain discussed expression of emotions and holding tension of having a knowing in the mind and a differing experience of the heart.  Patient open to ongoing chaplain follow up.

## 2018-02-11 ENCOUNTER — Inpatient Hospital Stay: Payer: Self-pay

## 2018-02-11 ENCOUNTER — Encounter: Payer: Self-pay | Admitting: Cardiovascular Disease

## 2018-02-11 DIAGNOSIS — E1165 Type 2 diabetes mellitus with hyperglycemia: Secondary | ICD-10-CM

## 2018-02-11 LAB — BASIC METABOLIC PANEL
Anion gap: 8 (ref 5–15)
BUN: 21 mg/dL — AB (ref 6–20)
CO2: 30 mmol/L (ref 22–32)
Calcium: 8.6 mg/dL — ABNORMAL LOW (ref 8.9–10.3)
Chloride: 97 mmol/L — ABNORMAL LOW (ref 98–111)
Creatinine, Ser: 0.58 mg/dL (ref 0.44–1.00)
GFR calc Af Amer: >60 mL/min (ref >60)
GFR calc non Af Amer: >60 mL/min (ref >60)
Glucose, Bld: 277 mg/dL — ABNORMAL HIGH (ref 70–99)
Potassium: 4.6 mmol/L (ref 3.5–5.1)
Sodium: 135 mmol/L (ref 135–145)

## 2018-02-11 LAB — CBC WITH DIFFERENTIAL/PLATELET
Basophils Absolute: 0 10*3/uL (ref 0–0.1)
Basophils Relative: 0 %
Eosinophils Absolute: 0 10*3/uL (ref 0–0.7)
Eosinophils Relative: 0 %
HCT: 30.4 % — ABNORMAL LOW (ref 35.0–47.0)
HEMOGLOBIN: 10.2 g/dL — AB (ref 12.0–16.0)
LYMPHS ABS: 0.8 10*3/uL — AB (ref 1.0–3.6)
LYMPHS PCT: 6 %
MCH: 33.2 pg (ref 26.0–34.0)
MCHC: 33.6 g/dL (ref 32.0–36.0)
MCV: 98.7 fL (ref 80.0–100.0)
Monocytes Absolute: 0.5 10*3/uL (ref 0.2–0.9)
Monocytes Relative: 4 %
NEUTROS PCT: 90 %
Neutro Abs: 12 10*3/uL — ABNORMAL HIGH (ref 1.4–6.5)
Platelets: 513 10*3/uL — ABNORMAL HIGH (ref 150–440)
RBC: 3.08 MIL/uL — AB (ref 3.80–5.20)
RDW: 13.9 % (ref 11.5–14.5)
WBC: 13.4 10*3/uL — AB (ref 3.6–11.0)

## 2018-02-11 LAB — GLUCOSE, CAPILLARY
GLUCOSE-CAPILLARY: 346 mg/dL — AB (ref 70–99)
GLUCOSE-CAPILLARY: 239 mg/dL — AB (ref 70–99)
Glucose-Capillary: 285 mg/dL — ABNORMAL HIGH (ref 70–99)
Glucose-Capillary: 249 mg/dL — ABNORMAL HIGH (ref 70–99)
Glucose-Capillary: 416 mg/dL — ABNORMAL HIGH (ref 70–99)

## 2018-02-11 MED ORDER — FUROSEMIDE 10 MG/ML IJ SOLN
40.0000 mg | Freq: Once | INTRAMUSCULAR | Status: AC
  Administered 2018-02-11: 40 mg via INTRAVENOUS
  Filled 2018-02-11: qty 4

## 2018-02-11 MED ORDER — INSULIN ASPART 100 UNIT/ML ~~LOC~~ SOLN
0.0000 [IU] | Freq: Every day | SUBCUTANEOUS | Status: DC
  Administered 2018-02-11: 2 [IU] via SUBCUTANEOUS
  Filled 2018-02-11: qty 1

## 2018-02-11 MED ORDER — INSULIN ASPART 100 UNIT/ML ~~LOC~~ SOLN
0.0000 [IU] | Freq: Three times a day (TID) | SUBCUTANEOUS | Status: DC
  Administered 2018-02-12: 4 [IU] via SUBCUTANEOUS
  Administered 2018-02-12 (×2): 11 [IU] via SUBCUTANEOUS
  Administered 2018-02-13: 20 [IU] via SUBCUTANEOUS
  Administered 2018-02-13: 4 [IU] via SUBCUTANEOUS
  Administered 2018-02-13: 15 [IU] via SUBCUTANEOUS
  Administered 2018-02-14 (×2): 11 [IU] via SUBCUTANEOUS
  Filled 2018-02-11 (×6): qty 1

## 2018-02-11 MED ORDER — METHYLPREDNISOLONE SODIUM SUCC 40 MG IJ SOLR
20.0000 mg | Freq: Two times a day (BID) | INTRAMUSCULAR | Status: DC
  Administered 2018-02-11 – 2018-02-13 (×4): 20 mg via INTRAVENOUS
  Filled 2018-02-11 (×5): qty 1

## 2018-02-11 MED ORDER — INSULIN ASPART 100 UNIT/ML ~~LOC~~ SOLN
3.0000 [IU] | Freq: Three times a day (TID) | SUBCUTANEOUS | Status: DC
  Administered 2018-02-11 – 2018-02-12 (×4): 3 [IU] via SUBCUTANEOUS
  Filled 2018-02-11 (×4): qty 1

## 2018-02-11 MED ORDER — INSULIN GLARGINE 100 UNIT/ML ~~LOC~~ SOLN
50.0000 [IU] | Freq: Every day | SUBCUTANEOUS | Status: DC
  Administered 2018-02-11 – 2018-02-13 (×3): 50 [IU] via SUBCUTANEOUS
  Filled 2018-02-11 (×5): qty 0.5

## 2018-02-11 NOTE — Progress Notes (Signed)
No major changes Cognition remains intact Still requiring HFNC at 55 % No new complaints  Vitals:   02/11/18 1100 02/11/18 1200 02/11/18 1300 02/11/18 1400  BP:  (!) 152/76 132/67 (!) 151/72  Pulse:  90 89 88  Resp:  17 19 (!) 21  Temp:   98.6 F (37 C)   TempSrc:   Oral   SpO2: 94% 100% 96% 98%  Weight:      Height:       Gen: NAD HEENT: NCAT, sclera white Neck: No JVD Lungs: bilateral crackles, no wheezes Cardiovascular: RRR, no murmurs Abdomen: Soft, nontender, normal BS Ext: without clubbing, cyanosis, edema Neuro: grossly intact Skin: Limited exam, no lesions noted   BMP Latest Ref Rng & Units 02/11/2018 02/10/2018 02/08/2018  Glucose 70 - 99 mg/dL 277(H) 273(H) 119(H)  BUN 6 - 20 mg/dL 21(H) 17 9  Creatinine 0.44 - 1.00 mg/dL 0.58 0.58 0.66  Sodium 135 - 145 mmol/L 135 135 137  Potassium 3.5 - 5.1 mmol/L 4.6 4.5 4.1  Chloride 98 - 111 mmol/L 97(L) 97(L) 100  CO2 22 - 32 mmol/L 30 30 29   Calcium 8.9 - 10.3 mg/dL 8.6(L) 8.7(L) 8.4(L)   CBC Latest Ref Rng & Units 02/11/2018 02/10/2018 02/07/2018  WBC 3.6 - 11.0 K/uL 13.4(H) 12.3(H) 11.4(H)  Hemoglobin 12.0 - 16.0 g/dL 10.2(L) 10.6(L) 10.3(L)  Hematocrit 35.0 - 47.0 % 30.4(L) 31.2(L) 29.9(L)  Platelets 150 - 440 K/uL 513(H) 421 233   CXR: [place] diffuse interstitial prominence  IMPRESSION: DKA, resolved IDDM, worsened control on systemic steroids Severe acute hypoxemic respiratory failure Suspect aspiration pneumonia, fully treated Diffuse pneumonitis pattern on CXR History of alcohol abuse, no evidence of withdrawal  PLAN/REC: Continue Lantus, SSI.  Dose of Lantus increased to 7/24 Continue supplemental oxygen to maintain SPO2 >88%.  Wean as able Completed 7 days Amp-sulbactam Continue low-dose lorazepam as needed Continue moderate dose systemic steroids -initiated 7/23 for pneumonitis Needs to remain in SDU until FiO2 requirements < 50%  Merton Border, MD PCCM service Mobile [phone] Pager  [phone] 02/11/2018 4:11 PM

## 2018-02-11 NOTE — Progress Notes (Signed)
Physical Therapy Treatment Patient Details Name: Michaela Johnson MRN: [id number] DOB: [birth year] Today's Date: 02/11/2018    History of Present Illness Pt is a 57 y.o. female presenting to hospital 02/03/18 with DKA, acute metabolic encephalopathy, hyperkalemia, pseudohyponatremia, elevated troponin secondary demand ischemia from DKA, acute renal failure, acute metabolic acidosis.  Pt with recent hospital admission for DKA 7/12-7/14.  PMH includes DM, alcohol use, depression, htn, paroxysmal a-fib, neuropathy, seizure, and tobacco use.    PT Comments    Pt able to stand and march in place x10 reps B LE's (x3 trials) and x20 reps B LE's (x1 trial) and perform seated LE ex's with O2 sats 95% or greater on HFNC (40 L O2 and 60% FiO2).  Pt requiring some pacing with activity but improved tolerance and balance noted with activities today.  Will continue to progress pt with strengthening, balance, and progressive functional mobility per pt tolerance.    Follow Up Recommendations  Home health PT     Equipment Recommendations  Rolling walker with 5" wheels    Recommendations for Other Services       Precautions / Restrictions Precautions Precautions: Fall Restrictions Weight Bearing Restrictions: No    Mobility  Bed Mobility               General bed mobility comments: deferred, pt up in recliner at start and end of PT session  Transfers Overall transfer level: Needs assistance Equipment used: None Transfers: Sit to/from Stand Sit to Stand: Min guard         General transfer comment: CGA for safety but no loss of balance noted; x3 trials  Ambulation/Gait Ambulation/Gait assistance: Min guard Gait Distance (Feet): (pt marched in place x10 reps B LE's x3 trials and x20 reps B LE's x1 trial) Assistive device: None   Gait velocity: decreased   General Gait Details: fairly steady with CGA; fair B foot clearance with marching in place   Stairs              Wheelchair Mobility    Modified Rankin (Stroke Patients Only)       Balance Overall balance assessment: Needs assistance Sitting-balance support: No upper extremity supported;Feet supported Sitting balance-Leahy Scale: Normal Sitting balance - Comments: steady sitting reaching outside BOS   Standing balance support: No upper extremity supported;During functional activity Standing balance-Leahy Scale: Good Standing balance comment: steady standing reaching within BOS                            Cognition Arousal/Alertness: Awake/alert Behavior During Therapy: WFL for tasks assessed/performed Overall Cognitive Status: Within Functional Limits for tasks assessed                                 General Comments: alert and oriented, follows all commands      Exercises General Exercises - Lower Extremity Ankle Circles/Pumps: AROM;Strengthening;Both;10 reps;Seated Long Arc Quad: AROM;Strengthening;Both;10 reps;Seated Hip Flexion/Marching: AROM;Strengthening;Both;10 reps;Seated    General Comments General comments (skin integrity, edema, etc.): Pt resting in chair upon PT entering room.  Nursing cleared pt for participation in physical therapy.  Pt agreeable to PT session.  Urine smell noted upon PT entering room (external catheter urine container noted to be full causing purewick suction to not work properly): nursing notified of this and of need for pt clean-up).      Pertinent Vitals/Pain Pain Assessment: No/denies pain  Pain Intervention(s): Limited activity within patient's tolerance;Monitored during session;Repositioned  HR WFL during session    Home Living                      Prior Function            PT Goals (current goals can now be found in the care plan section) Acute Rehab PT Goals Patient Stated Goal: to get better and go home PT Goal Formulation: With patient Time For Goal Achievement: 02/23/18 Potential to Achieve  Goals: Fair Progress towards PT goals: Progressing toward goals    Frequency    Min 2X/week      PT Plan Current plan remains appropriate    Co-evaluation              AM-PAC PT "6 Clicks" Daily Activity  Outcome Measure  Difficulty turning over in bed (including adjusting bedclothes, sheets and blankets)?: A Little Difficulty moving from lying on back to sitting on the side of the bed? : A Little Difficulty sitting down on and standing up from a chair with arms (e.g., wheelchair, bedside commode, etc,.)?: Unable Help needed moving to and from a bed to chair (including a wheelchair)?: A Little Help needed walking in hospital room?: A Little Help needed climbing 3-5 steps with a railing? : A Little 6 Click Score: 16    End of Session Equipment Utilized During Treatment: Other (comment)(HFNC) Activity Tolerance: Patient tolerated treatment well Patient left: in chair;with call bell/phone within reach Nurse Communication: Mobility status;Precautions;Other (comment)(Pt's O2 status during session) PT Visit Diagnosis: Unsteadiness on feet (R26.81);Other abnormalities of gait and mobility (R26.89);Muscle weakness (generalized) (M62.81)     Time: 1542-1600 PT Time Calculation (min) (ACUTE ONLY): 18 min  Charges:  $Therapeutic Exercise: 8-22 mins                    Leitha Bleak, PT 02/11/18, 4:16 PM [phone]

## 2018-02-11 NOTE — Progress Notes (Signed)
[place] at [place] NAME: Michaela Johnson    MR#:  [id number]  DATE OF BIRTH:  07/09/1961  SUBJECTIVE:  CHIEF COMPLAINT:   Chief Complaint  Patient presents with  . Weakness  . Emesis   Patient continues to be on 60% high flow nasal cannula.  No chest pain or orthopnea.  In stepdown unit. Blood sugars ranging in 200-300s  REVIEW OF SYSTEMS:    ROS  CONSTITUTIONAL: No documented fever. No fatigue, weakness. No weight gain, no weight loss.  EYES: No blurry or double vision.  ENT: No tinnitus. No postnasal drip. No redness of the oropharynx.  RESPIRATORY: No cough, no wheeze, no hemoptysis.  Decreased dyspnea.  CARDIOVASCULAR: No chest pain. No orthopnea. No palpitations. No syncope.  GASTROINTESTINAL: Has nausea, Has no vomiting no diarrhea. Mild abdominal pain. No melena or hematochezia.  GENITOURINARY: No dysuria or hematuria.  ENDOCRINE: No polyuria or nocturia. No heat or cold intolerance.  HEMATOLOGY: No anemia. No bruising. No bleeding.  INTEGUMENTARY: No rashes. No lesions.  MUSCULOSKELETAL: No arthritis. No swelling. No gout.  NEUROLOGIC: No numbness, tingling, or ataxia. No seizure-type activity.  PSYCHIATRIC: No anxiety. No insomnia. No ADD.   DRUG ALLERGIES:  No Known Allergies  VITALS:  Blood pressure (!) 148/73, pulse 89, temperature 98.9 F (37.2 C), resp. rate (!) 21, height 5\' 2"  (1.575 m), weight 67.2 kg (148 lb 2.4 oz), SpO2 93 %.  PHYSICAL EXAMINATION:   Physical Exam  GENERAL:  57 y.o.-year-old patient lying in the bed with no acute distress.  EYES: Pupils equal, round, reactive to light and accommodation. No scleral icterus. Extraocular muscles intact.  HEENT: Head atraumatic, normocephalic. Oropharynx and nasopharynx clear.  NECK:  Supple, no jugular venous distention. No thyroid enlargement, no tenderness.  LUNGS: Decreased breath sounds bilaterally, scattered Rales in both lungs. No use of accessory  muscles of respiration. On HFNC. CARDIOVASCULAR: S1, S2 normal. No murmurs, rubs, or gallops.  ABDOMEN: Soft, nondistended. Bowel sounds present. No organomegaly or mass.  EXTREMITIES: No cyanosis, clubbing or edema b/l.    NEUROLOGIC: Cranial nerves II through XII are intact. No focal Motor or sensory deficits b/l.   PSYCHIATRIC: The patient is alert and oriented x 3.  SKIN: No obvious rash, lesion, or ulcer.   LABORATORY PANEL:   CBC Recent Labs  Lab 02/11/18 0451  WBC 13.4*  HGB 10.2*  HCT 30.4*  PLT 513*   ------------------------------------------------------------------------------------------------------------------ Chemistries  Recent Labs  Lab 02/05/18 0317  02/11/18 0451  NA 138   < > 135  K 3.9   < > 4.6  CL 101   < > 97*  CO2 25   < > 30  GLUCOSE 145*   < > 277*  BUN 22*   < > 21*  CREATININE 0.84   < > 0.58  CALCIUM 7.4*   < > 8.6*  MG 2.3  --   --   AST 40  --   --   ALT 27  --   --   ALKPHOS 103  --   --   BILITOT 0.8  --   --    < > = values in this interval not displayed.   ------------------------------------------------------------------------------------------------------------------  Cardiac Enzymes No results for input(s): TROPONINI in the last 168 hours. ------------------------------------------------------------------------------------------------------------------  RADIOLOGY:  Dg Chest Port 1 View  Result Date: 02/11/2018 CLINICAL DATA:  Respiratory failure. EXAM: PORTABLE CHEST 1 VIEW COMPARISON:  02/10/2018.  02/08/2018.  02/04/2018. FINDINGS:  Heart size stable. Diffuse bilateral interstitial prominence with opacification the right lung base unchanged. Tiny bilateral pleural effusions cannot be excluded. No pneumothorax. No acute bony abnormality. IMPRESSION: Diffuse bilateral interstitial prominence with opacification in the right lung base unchanged. Findings most consistent with interstitial and alveolar edema. Chest x-ray is unchanged  from prior exam. Electronically Signed   By: Marcello Moores  Register   On: 02/11/2018 05:36   Dg Chest Port 1 View  Result Date: 02/10/2018 CLINICAL DATA:  Respiratory failure EXAM: PORTABLE CHEST 1 VIEW COMPARISON:  February 08, 2018 and April 15, 2017 FINDINGS: Diffuse interstitial edema remains. There is less consolidation in the left lower lobe compared to recent study. Patchy alveolar opacity on the right inferiorly is stable. Heart is upper normal in size with pulmonary vascularity normal. No adenopathy. No bone lesions. IMPRESSION: Widespread interstitial edema. Patchy alveolar opacity in the bases with partial clearing on the left and no change on the right. No new opacity evident. Stable cardiac silhouette. Electronically Signed   By: Lowella Grip III M.D.   On: 02/10/2018 07:12     ASSESSMENT AND PLAN:   57 year old female patient with history of diabetes mellitus, alcohol abuse, GERD, hypertension, tobacco abuse currently in stepdown unit for diabetic ketoacidosis, hypoxia  -Acute respiratory distress with hypoxia secondary to pneumonia. Continue high flow nasal cannula.  Wean [place] related to keep saturations over 90%. On IV Unasyn and IV steroids started Discussed with Dr. Alva Garnet of pulmonary. Continue stepdown status Light source  -Diabetic ketoacidosis resolved Off IV insulin drip On long-acting insulin and sliding scale coverage Uncontrolled due to IV steroids  -Metabolic acidosis resolved  -Atrial fibrillation on diltiazem for rate control  -Tobacco abuse Counseled to quit smoking earlier in the admission  -Hyperkalemia resolved  -Alcohol abuse ciwa protocol Thiamine and folate supplements  -Elevated troponin secondary to demand ischemia  -Acute kidney injury secondary to dehydration resolved  All the records are reviewed and case discussed with Care Management/Social Worker. Management plans discussed with the patient, family and they are in  agreement.  CODE STATUS: Full code  DVT Prophylaxis: SCDs  TOTAL TIME TAKING CARE OF THIS PATIENT: 30 minutes.   Leia Alf Zalika Tieszen M.D on 02/11/2018 at 11:03 AM  Between 7am to 6pm - Pager - [phone]  After 6pm go to [email or web address] - password [email or web address] [facility]  [facility] [place] [facility]  Office  [phone]  CC: Primary care physician; [name], NP  Note: This dictation was prepared with Dragon dictation along with smaller phrase technology. Any transcriptional errors that result from this process are unintentional.

## 2018-02-12 ENCOUNTER — Inpatient Hospital Stay: Payer: Self-pay

## 2018-02-12 LAB — CBC WITH DIFFERENTIAL/PLATELET
Basophils Absolute: 0.1 10*3/uL (ref 0–0.1)
Basophils Relative: 1 %
Eosinophils Absolute: 0 10*3/uL (ref 0–0.7)
Eosinophils Relative: 0 %
HEMATOCRIT: 32.3 % — AB (ref 35.0–47.0)
HEMOGLOBIN: 11.3 g/dL — AB (ref 12.0–16.0)
LYMPHS PCT: 11 %
Lymphs Abs: 1.2 10*3/uL (ref 1.0–3.6)
MCH: 34.3 pg — AB (ref 26.0–34.0)
MCHC: 34.9 g/dL (ref 32.0–36.0)
MCV: 98.2 fL (ref 80.0–100.0)
MONOS PCT: 7 %
Monocytes Absolute: 0.8 10*3/uL (ref 0.2–0.9)
NEUTROS ABS: 8.8 10*3/uL — AB (ref 1.4–6.5)
Neutrophils Relative %: 81 %
Platelets: 553 10*3/uL — ABNORMAL HIGH (ref 150–440)
RBC: 3.29 MIL/uL — AB (ref 3.80–5.20)
RDW: 14.2 % (ref 11.5–14.5)
WBC: 10.8 10*3/uL (ref 3.6–11.0)

## 2018-02-12 LAB — BASIC METABOLIC PANEL
ANION GAP: 7 (ref 5–15)
BUN: 22 mg/dL — ABNORMAL HIGH (ref 6–20)
CHLORIDE: 93 mmol/L — AB (ref 98–111)
CO2: 33 mmol/L — AB (ref 22–32)
CREATININE: 0.58 mg/dL (ref 0.44–1.00)
Calcium: 9 mg/dL (ref 8.9–10.3)
GFR calc non Af Amer: >60 mL/min (ref >60)
Glucose, Bld: 250 mg/dL — ABNORMAL HIGH (ref 70–99)
POTASSIUM: 4.9 mmol/L (ref 3.5–5.1)
SODIUM: 133 mmol/L — AB (ref 135–145)

## 2018-02-12 LAB — GLUCOSE, CAPILLARY
GLUCOSE-CAPILLARY: 262 mg/dL — AB (ref 70–99)
GLUCOSE-CAPILLARY: 160 mg/dL — AB (ref 70–99)
Glucose-Capillary: 280 mg/dL — ABNORMAL HIGH (ref 70–99)
Glucose-Capillary: 181 mg/dL — ABNORMAL HIGH (ref 70–99)
Glucose-Capillary: 273 mg/dL — ABNORMAL HIGH (ref 70–99)

## 2018-02-12 MED ORDER — GLUCERNA SHAKE PO LIQD
237.0000 mL | Freq: Three times a day (TID) | ORAL | Status: DC
  Administered 2018-02-12 – 2018-02-14 (×4): 237 mL via ORAL

## 2018-02-12 MED ORDER — FUROSEMIDE 10 MG/ML IJ SOLN
40.0000 mg | INTRAMUSCULAR | Status: AC
  Administered 2018-02-12: 40 mg via INTRAVENOUS
  Filled 2018-02-12: qty 4

## 2018-02-12 MED ORDER — INSULIN ASPART 100 UNIT/ML ~~LOC~~ SOLN
6.0000 [IU] | Freq: Three times a day (TID) | SUBCUTANEOUS | Status: DC
  Administered 2018-02-12 – 2018-02-14 (×7): 6 [IU] via SUBCUTANEOUS
  Filled 2018-02-12 (×5): qty 1

## 2018-02-12 MED ORDER — ENOXAPARIN SODIUM 40 MG/0.4ML ~~LOC~~ SOLN
40.0000 mg | SUBCUTANEOUS | Status: DC
  Administered 2018-02-12 – 2018-02-13 (×2): 40 mg via SUBCUTANEOUS
  Filled 2018-02-12 (×2): qty 0.4

## 2018-02-12 NOTE — Progress Notes (Signed)
Pt transferred from stepdown by wheelchair per MD order without incident. Prior to transfer, report taken from [place]. Pt alert and oriented x 3 on admit to floor. Vital signs stable. Pt oxygen saturation on 2 lpm 93% on admit to floor. Bed alarm applied. Pt instructed regarding unit routine. Blisters on the bottom of the patient's feet present on admission are no longer present. Areas have dried. Will continue to monitor.

## 2018-02-12 NOTE — Progress Notes (Signed)
Patient moved to room 148 by wheelchair with [place], [place].  Patient alert on o2 with no distress noted when leaving ICU.

## 2018-02-12 NOTE — Progress Notes (Signed)
[place] at [place] NAME: Michaela Johnson    MR#:  [id number]  DATE OF BIRTH:  1960/12/05  SUBJECTIVE:  CHIEF COMPLAINT:   Chief Complaint  Patient presents with  . Weakness  . Emesis   Up in the chair.  On 35% o2 thru HFNC  REVIEW OF SYSTEMS:    ROS  CONSTITUTIONAL: No documented fever. No fatigue, weakness. No weight gain, no weight loss.  EYES: No blurry or double vision.  ENT: No tinnitus. No postnasal drip. No redness of the oropharynx.  RESPIRATORY: No cough, no wheeze, no hemoptysis.  Decreased dyspnea.  CARDIOVASCULAR: No chest pain. No orthopnea. No palpitations. No syncope.  GASTROINTESTINAL: Has nausea, Has no vomiting no diarrhea. Mild abdominal pain. No melena or hematochezia.  GENITOURINARY: No dysuria or hematuria.  ENDOCRINE: No polyuria or nocturia. No heat or cold intolerance.  HEMATOLOGY: No anemia. No bruising. No bleeding.  INTEGUMENTARY: No rashes. No lesions.  MUSCULOSKELETAL: No arthritis. No swelling. No gout.  NEUROLOGIC: No numbness, tingling, or ataxia. No seizure-type activity.  PSYCHIATRIC: No anxiety. No insomnia. No ADD.   DRUG ALLERGIES:  No Known Allergies  VITALS:  Blood pressure 107/69, pulse 80, temperature 98.6 F (37 C), temperature source Oral, resp. rate (!) 23, height 5\' 2"  (1.575 m), weight 65.7 kg (144 lb 13.5 oz), SpO2 95 %.  PHYSICAL EXAMINATION:   Physical Exam  GENERAL:  57 y.o.-year-old patient lying in the bed with no acute distress.  EYES: Pupils equal, round, reactive to light and accommodation. No scleral icterus. Extraocular muscles intact.  HEENT: Head atraumatic, normocephalic. Oropharynx and nasopharynx clear.  NECK:  Supple, no jugular venous distention. No thyroid enlargement, no tenderness.  LUNGS: Decreased breath sounds bilaterally, scattered Rales in both lungs. No use of accessory muscles of respiration. On HFNC. CARDIOVASCULAR: S1, S2 normal. No murmurs, rubs,  or gallops.  ABDOMEN: Soft, nondistended. Bowel sounds present. No organomegaly or mass.  EXTREMITIES: No cyanosis, clubbing or edema b/l.    NEUROLOGIC: Cranial nerves II through XII are intact. No focal Motor or sensory deficits b/l.   PSYCHIATRIC: The patient is alert and oriented x 3.  SKIN: No obvious rash, lesion, or ulcer.   LABORATORY PANEL:   CBC Recent Labs  Lab 02/12/18 0420  WBC 10.8  HGB 11.3*  HCT 32.3*  PLT 553*   ------------------------------------------------------------------------------------------------------------------ Chemistries  Recent Labs  Lab 02/12/18 0420  NA 133*  K 4.9  CL 93*  CO2 33*  GLUCOSE 250*  BUN 22*  CREATININE 0.58  CALCIUM 9.0   ------------------------------------------------------------------------------------------------------------------  Cardiac Enzymes No results for input(s): TROPONINI in the last 168 hours. ------------------------------------------------------------------------------------------------------------------  RADIOLOGY:  Dg Chest Port 1 View  Result Date: 02/12/2018 CLINICAL DATA:  Acute respiratory failure EXAM: PORTABLE CHEST 1 VIEW COMPARISON:  02/11/2018, 02/10/2018, 02/08/2018, 02/06/2018, 04/15/2017 FINDINGS: No significant interval change in diffuse interstitial opacity and mild peripheral ground-glass opacity at the lower lungs. Cardiomegaly with aortic atherosclerosis. No pneumothorax. IMPRESSION: No significant interval change in cardiomegaly and diffuse interstitial edema. No change in more confluent ground-glass opacity at the peripheral bases. Electronically Signed   By: Donavan Foil M.D.   On: 02/12/2018 01:34   Dg Chest Port 1 View  Result Date: 02/11/2018 CLINICAL DATA:  Respiratory failure. EXAM: PORTABLE CHEST 1 VIEW COMPARISON:  02/10/2018.  02/08/2018.  02/04/2018. FINDINGS: Heart size stable. Diffuse bilateral interstitial prominence with opacification the right lung base unchanged. Tiny  bilateral pleural effusions cannot be excluded. No  pneumothorax. No acute bony abnormality. IMPRESSION: Diffuse bilateral interstitial prominence with opacification in the right lung base unchanged. Findings most consistent with interstitial and alveolar edema. Chest x-ray is unchanged from prior exam. Electronically Signed   By: Marcello Moores  Register   On: 02/11/2018 05:36     ASSESSMENT AND PLAN:   57 year old female patient with history of diabetes mellitus, alcohol abuse, GERD, hypertension, tobacco abuse currently in stepdown unit for diabetic ketoacidosis, hypoxia  -Acute respiratory distress with hypoxia secondary to pneumonia. Continue high flow nasal cannula.  On IV Unasyn and IV steroids  Discussed with Dr. Alva Garnet of pulmonary. Continue stepdown status  -Diabetic ketoacidosis resolved Off IV insulin drip On lantus Uncontrolled due to steroids  -Metabolic acidosis resolved  -Atrial fibrillation on diltiazem for rate control  -Tobacco abuse Counseled to quit smoking earlier in the admission  -Hyperkalemia resolved  -Alcohol abuse No withdrawals  -Elevated troponin secondary to demand ischemia  -Acute kidney injury secondary to dehydration resolved  All the records are reviewed and case discussed with Care Management/Social Worker. Management plans discussed with the patient, family and they are in agreement.  CODE STATUS: Full code  DVT Prophylaxis: SCDs  TOTAL TIME TAKING CARE OF THIS PATIENT: 30 minutes.   Leia Alf Biance Moncrief M.D on 02/12/2018 at 12:17 PM  Between 7am to 6pm - Pager - [phone]  After 6pm go to [email or web address] - password [email or web address] [facility]  [facility] [place] [facility]  Office  [phone]  CC: Primary care physician; [name], NP  Note: This dictation was prepared with Dragon dictation along with smaller phrase technology. Any transcriptional errors that result from this process are unintentional.

## 2018-02-12 NOTE — Progress Notes (Signed)
Inpatient Diabetes Program Recommendations  AACE/ADA: New Consensus Statement on Inpatient Glycemic Control (2019)  Target Ranges:  Prepandial:   less than 140 mg/dL      Peak postprandial:   less than 180 mg/dL (1-2 hours)      Critically ill patients:  140 - 180 mg/dL  Results for Michaela Johnson, Michaela Johnson (MRN [id number]) as of 02/12/2018 08:22  Ref. Range 02/12/2018 04:20  Glucose Latest Ref Range: 70 - 99 mg/dL 250 (H)   Results for Michaela Johnson, Michaela Johnson (MRN [id number]) as of 02/12/2018 08:22  Ref. Range 02/11/2018 07:20 02/11/2018 11:52 02/11/2018 17:03 02/11/2018 19:27 02/11/2018 21:07  Glucose-Capillary Latest Ref Range: 70 - 99 mg/dL 285 (H) 249 (H) 416 (H) 346 (H) 239 (H)   Review of Glycemic Control Diabetes history: DM2 Outpatient Diabetes medications: Lantus 50 units QHS Current orders for Inpatient glycemic control: Lantus 50 units QHS, Novolog 0-20 units TID with meals, Novolog 0-5 units QHS, Novolog 3 units TID with meals; Solumedrol 20 mg Q12H  Inpatient Diabetes Program Recommendations:  Insulin - Meal Coverage: If steroids are continued, please consider increasing meal coverage to Novolog 6 units TID with meals if patient eats at least 50% of meals.  NOTE: Noted steroids decreased yesterday evening. Lab glucose 250 mg/dl today and post prandial glucose consistently elevated.   Thanks, Barnie Alderman, RN, MSN, CDE Diabetes Coordinator Inpatient Diabetes Program [phone] (Team Pager from 8am to 5pm)

## 2018-02-12 NOTE — Progress Notes (Signed)
   02/12/18 1615  Clinical Encounter Type  Visited With Patient  Visit Type Follow-up  Spiritual Encounters  Spiritual Needs Emotional;Prayer   Chaplain followed up with patient.  Patient spoke of feeling hopeful that she was closer to being able to go home.  Patient expressed interest in learning about advanced directives.  Chaplain provided education on advanced directives for patient.  Patient noted that she wants her daughter to be her HCPOA.  When patient's meal arrived, she expressed desire to focus on meal and requested a prayer before chaplain left.  Chaplain encouraged patient to have chaplain paged with any additional questions or needs.

## 2018-02-12 NOTE — Progress Notes (Signed)
Patient weaned to 8L wall high flow at noon and now Amy, RRT transitioning patient to regular nasal cannula.  Dr. Jefferson Fuel present and gave order to transfer patient to any med surg unit without tele.

## 2018-02-12 NOTE — Progress Notes (Signed)
Report called to [place], RN on 1A.  Patient has been alert sitting up in chair during most of shift.  Denies pain.  Patient will be moved to room 148.

## 2018-02-12 NOTE — Care Management (Signed)
Have reached out to [facility] to determine if agency has been asked to assess this patient for charity care.  Possible need for home 02, PT RN SW

## 2018-02-12 NOTE — Progress Notes (Addendum)
No major changes Cognition remains intact Still requiring HFNC at has been reduced at 35% from 55% yesterday  Vitals:   02/12/18 0500 02/12/18 0833 02/12/18 0900 02/12/18 1000  BP:  124/60 128/65 105/68  Pulse:  79 89 83  Resp:  14 15 (!) 21  Temp:  98.5 F (36.9 C)    TempSrc:  Oral    SpO2:  95% (!) 82% (!) 87%  Weight: 144 lb 13.5 oz (65.7 kg)     Height:       Gen: NAD HEENT: NCAT, sclera white Neck: No JVD Lungs: bilateral crackles, no wheezes Cardiovascular: RRR, no murmurs Abdomen: Soft, nontender, normal BS Ext: without clubbing, cyanosis, edema Neuro: grossly intact Skin: Limited exam, no lesions noted   BMP Latest Ref Rng & Units 02/12/2018 02/11/2018 02/10/2018  Glucose 70 - 99 mg/dL 250(H) 277(H) 273(H)  BUN 6 - 20 mg/dL 22(H) 21(H) 17  Creatinine 0.44 - 1.00 mg/dL 0.58 0.58 0.58  Sodium 135 - 145 mmol/L 133(L) 135 135  Potassium 3.5 - 5.1 mmol/L 4.9 4.6 4.5  Chloride 98 - 111 mmol/L 93(L) 97(L) 97(L)  CO2 22 - 32 mmol/L 33(H) 30 30  Calcium 8.9 - 10.3 mg/dL 9.0 8.6(L) 8.7(L)   CBC Latest Ref Rng & Units 02/12/2018 02/11/2018 02/10/2018  WBC 3.6 - 11.0 K/uL 10.8 13.4(H) 12.3(H)  Hemoglobin 12.0 - 16.0 g/dL 11.3(L) 10.2(L) 10.6(L)  Hematocrit 35.0 - 47.0 % 32.3(L) 30.4(L) 31.2(L)  Platelets 150 - 440 K/uL 553(H) 513(H) 421   CXR: [place] diffuse interstitial prominence  IMPRESSION: DKA, resolved IDDM, worsened control on systemic steroids Severe acute hypoxemic respiratory failure Suspect aspiration pneumonia, fully treated Diffuse pneumonitis pattern on CXR. If unable to wean FiO2 will obtain noncontrast CT scan of the chest with high-resolution History of alcohol abuse, no evidence of withdrawal  PLAN/REC: Continue Lantus, SSI.  Dose of Lantus increased to 7/24 Continue supplemental oxygen to maintain SPO2 >88%.  Wean as able Completed 7 days Amp-sulbactam Continue low-dose lorazepam as needed Continue moderate dose systemic steroids -initiated 7/23 for  pneumonitis Needs to remain in SDU until FiO2 requirements < 50%   Hermelinda Dellen, DO

## 2018-02-13 LAB — GLUCOSE, CAPILLARY
GLUCOSE-CAPILLARY: 412 mg/dL — AB (ref 70–99)
GLUCOSE-CAPILLARY: 349 mg/dL — AB (ref 70–99)
GLUCOSE-CAPILLARY: 380 mg/dL — AB (ref 70–99)
GLUCOSE-CAPILLARY: 151 mg/dL — AB (ref 70–99)
Glucose-Capillary: 186 mg/dL — ABNORMAL HIGH (ref 70–99)

## 2018-02-13 MED ORDER — HYDROCHLOROTHIAZIDE 12.5 MG PO CAPS: 12.5000 mg | ORAL_CAPSULE | Freq: Every day | ORAL | 0 refills | Status: DC

## 2018-02-13 MED ORDER — PREDNISONE 20 MG PO TABS
40.0000 mg | ORAL_TABLET | Freq: Every day | ORAL | Status: DC
  Administered 2018-02-14: 40 mg via ORAL
  Filled 2018-02-13: qty 2

## 2018-02-13 MED ORDER — LEVOFLOXACIN IN D5W 750 MG/150ML IV SOLN
750.0000 mg | INTRAVENOUS | Status: DC
  Administered 2018-02-14: 750 mg via INTRAVENOUS
  Filled 2018-02-13 (×2): qty 150

## 2018-02-13 NOTE — Progress Notes (Signed)
Physical Therapy Treatment Patient Details Name: Michaela Johnson MRN: [id number] DOB: August 16, 1960 Today's Date: 02/13/2018    History of Present Illness Pt is a 57 y.o. female presenting to hospital 02/03/18 with DKA, acute metabolic encephalopathy, hyperkalemia, pseudohyponatremia, elevated troponin secondary demand ischemia from DKA, acute renal failure, acute metabolic acidosis.  Pt with recent hospital admission for DKA 7/12-7/14.  PMH includes DM, alcohol use, depression, htn, paroxysmal a-fib, neuropathy, seizure, and tobacco use.    PT Comments    Pt agreeable to PT; no voiced complaints. Pt progressing mobility well with primarily supervision. Pt wished to use rolling walker. O2 saturation primary issue with activity. Pt does not manage ambulation on 1L with decrease to 83% and continues so on 2L; requires 4 min seated recovery to increase to 89%. Pt asymptomatic throughout. Nursing notified. Pt would be best served with home health PT to progress activity with safe O2 saturation limits. Continue to progress safe functional mobility with/without rolling walker.   Follow Up Recommendations  Home health PT     Equipment Recommendations  Rolling walker with 5" wheels    Recommendations for Other Services       Precautions / Restrictions Precautions Precautions: Fall Restrictions Weight Bearing Restrictions: No    Mobility  Bed Mobility Overal bed mobility: Independent                Transfers Overall transfer level: Modified independent Equipment used: Rolling walker (2 wheeled)   Sit to Stand: Modified independent (Device/Increase time)            Ambulation/Gait Ambulation/Gait assistance: Supervision Gait Distance (Feet): 170 Feet Assistive device: Rolling walker (2 wheeled) Gait Pattern/deviations: Step-through pattern Gait velocity: decreased Gait velocity interpretation: <1.8 ft/sec, indicate of risk for recurrent falls General Gait Details: Decreased  speed; no LOB. PT O2 sat on 1L decreases with 120 walk to 83; increased to 2 L for remaining 50 ft and continues 83%; Recovery on 2L post 4 min to 89%. Pt asymptomatic   Stairs             Wheelchair Mobility    Modified Rankin (Stroke Patients Only)       Balance Overall balance assessment: Modified Independent                                          Cognition Arousal/Alertness: Awake/alert(reports fatigue) Behavior During Therapy: WFL for tasks assessed/performed Overall Cognitive Status: Within Functional Limits for tasks assessed                                        Exercises      General Comments        Pertinent Vitals/Pain Pain Assessment: No/denies pain    Home Living                      Prior Function            PT Goals (current goals can now be found in the care plan section) Progress towards PT goals: Progressing toward goals    Frequency    Min 2X/week      PT Plan Current plan remains appropriate    Co-evaluation              AM-PAC PT "6 Clicks"  Daily Activity  Outcome Measure  Difficulty turning over in bed (including adjusting bedclothes, sheets and blankets)?: None Difficulty moving from lying on back to sitting on the side of the bed? : None Difficulty sitting down on and standing up from a chair with arms (e.g., wheelchair, bedside commode, etc,.)?: None Help needed moving to and from a bed to chair (including a wheelchair)?: None Help needed walking in hospital room?: None Help needed climbing 3-5 steps with a railing? : A Little 6 Click Score: 23    End of Session Equipment Utilized During Treatment: Gait belt       PT Visit Diagnosis: Unsteadiness on feet (R26.81);Other abnormalities of gait and mobility (R26.89);Muscle weakness (generalized) (M62.81)     Time: 1460-4799 PT Time Calculation (min) (ACUTE ONLY): 21 min  Charges:  $Gait Training: 8-22 mins                       Larae Grooms, PTA 02/13/2018, 11:52 AM

## 2018-02-13 NOTE — Progress Notes (Signed)
SATURATION QUALIFICATIONS: (This note is used to comply with regulatory documentation for home oxygen)  Patient Saturations on Room Air at Rest = 84%  Patient Saturations on Room Air while Ambulating = 70% (chair to door: 40 feet)  Patient Saturations on --- Liters of oxygen while Ambulating = unable to assess (ambulated back to chair and placed on O2)  Please briefly explain why patient needs home oxygen: placed on 2L/[place], saturation is currently 94% at rest

## 2018-02-13 NOTE — Consult Note (Signed)
Pharmacy Antibiotic Note  Michaela Johnson is a 57 y.o. female admitted on 02/03/2018 with pneumonia.  Pharmacy has been consulted for Levofloxacin dosing.  Plan: Start Levofloxacin 750mg  IV every 24 hours.   Height: 5\' 2"  (157.5 cm) Weight: 144 lb 6.4 oz (65.5 kg) IBW/kg (Calculated) : 50.1  Temp (24hrs), Avg:98.6 F (37 C), Min:98.2 F (36.8 C), Max:99.4 F (37.4 C)  Recent Labs  Lab 02/07/18 0535 02/08/18 0418 02/10/18 0609 02/11/18 0451 02/12/18 0420  WBC 11.4*  --  12.3* 13.4* 10.8  CREATININE 0.64 0.66 0.58 0.58 0.58    Estimated Creatinine Clearance: 68.1 mL/min (by C-G formula based on SCr of 0.58 mg/dL).    No Known Allergies  Antimicrobials this admission: 7/27 Levofloxacin >>    Thank you for allowing pharmacy to be a part of this patient's care.  Pernell Dupre, PharmD, BCPS Clinical Pharmacist 02/13/2018 9:23 PM

## 2018-02-13 NOTE — Progress Notes (Signed)
Advanced care plan.  Purpose of the Encounter: CODE STATUS  Parties in Attendance:Patient  Patient's Decision Capacity:Good  Subjective/Patient's story: Presented for elevated blood sugar   Objective/Medical story Has diabetic ketoacidosis Has hypoxia   Goals of care determination:  Advance care directives and goals of care discussed Patient wants cpr , intubation if need arises   CODE STATUS: Full code   Time spent discussing advanced care planning: 16 minutes

## 2018-02-13 NOTE — Progress Notes (Signed)
[place] [facility] [place] NAME: Michaela Johnson    MR#:  [id number]  DATE OF BIRTH:  [birth year]  SUBJECTIVE:  CHIEF COMPLAINT:   Chief Complaint  Patient presents with  . Weakness  . Emesis  Patient seen and evaluated today Tried weaning oxygen but unsucessful Needs oxygen via [place] at 2 liter No fever  REVIEW OF SYSTEMS:    ROS  CONSTITUTIONAL: No documented fever. No fatigue, weakness. No weight gain, no weight loss.  EYES: No blurry or double vision.  ENT: No tinnitus. No postnasal drip. No redness of the oropharynx.  RESPIRATORY: No cough, no wheeze, no hemoptysis.  Decreased dyspnea.  CARDIOVASCULAR: No chest pain. No orthopnea. No palpitations. No syncope.  GASTROINTESTINAL: Has nausea, Has no vomiting no diarrhea. Mild abdominal pain. No melena or hematochezia.  GENITOURINARY: No dysuria or hematuria.  ENDOCRINE: No polyuria or nocturia. No heat or cold intolerance.  HEMATOLOGY: No anemia. No bruising. No bleeding.  INTEGUMENTARY: No rashes. No lesions.  MUSCULOSKELETAL: No arthritis. No swelling. No gout.  NEUROLOGIC: No numbness, tingling, or ataxia. No seizure-type activity.  PSYCHIATRIC: No anxiety. No insomnia. No ADD.   DRUG ALLERGIES:  No Known Allergies  VITALS:  Blood pressure 119/71, pulse 85, temperature 98.3 F (36.8 C), temperature source Oral, resp. rate 18, height 5\' 2"  (1.575 m), weight 65.5 kg (144 lb 6.4 oz), SpO2 95 %.  PHYSICAL EXAMINATION:   Physical Exam  GENERAL:  57 y.o.-year-old patient lying in the bed with no acute distress.  EYES: Pupils equal, round, reactive to light and accommodation. No scleral icterus. Extraocular muscles intact.  HEENT: Head atraumatic, normocephalic. Oropharynx and nasopharynx clear.  NECK:  Supple, no jugular venous distention. No thyroid enlargement, no tenderness.  LUNGS: Decreased breath sounds bilaterally, scattered Rales in both lungs. No use of accessory muscles of  respiration. On HFNC. CARDIOVASCULAR: S1, S2 normal. No murmurs, rubs, or gallops.  ABDOMEN: Soft, nondistended. Bowel sounds present. No organomegaly or mass.  EXTREMITIES: No cyanosis, clubbing or edema b/l.    NEUROLOGIC: Cranial nerves II through XII are intact. No focal Motor or sensory deficits b/l.   PSYCHIATRIC: The patient is alert and oriented x 3.  SKIN: No obvious rash, lesion, or ulcer.   LABORATORY PANEL:   CBC Recent Labs  Lab 02/12/18 0420  WBC 10.8  HGB 11.3*  HCT 32.3*  PLT 553*   ------------------------------------------------------------------------------------------------------------------ Chemistries  Recent Labs  Lab 02/12/18 0420  NA 133*  K 4.9  CL 93*  CO2 33*  GLUCOSE 250*  BUN 22*  CREATININE 0.58  CALCIUM 9.0   ------------------------------------------------------------------------------------------------------------------  Cardiac Enzymes No results for input(s): TROPONINI in the last 168 hours. ------------------------------------------------------------------------------------------------------------------  RADIOLOGY:  Dg Chest Port 1 View  Result Date: 02/12/2018 CLINICAL DATA:  Acute respiratory failure EXAM: PORTABLE CHEST 1 VIEW COMPARISON:  02/11/2018, 02/10/2018, 02/08/2018, 02/06/2018, 04/15/2017 FINDINGS: No significant interval change in diffuse interstitial opacity and mild peripheral ground-glass opacity at the lower lungs. Cardiomegaly with aortic atherosclerosis. No pneumothorax. IMPRESSION: No significant interval change in cardiomegaly and diffuse interstitial edema. No change in more confluent ground-glass opacity at the peripheral bases. Electronically Signed   By: Donavan Foil M.D.   On: 02/12/2018 01:34     ASSESSMENT AND PLAN:   57 year old female patient with history of diabetes mellitus, alcohol abuse, GERD, hypertension, tobacco abuse currently in stepdown unit for diabetic ketoacidosis, hypoxia  -Acute  respiratory distress with hypoxia secondary to pneumonia. Continue oxygen via nasal  canula Discontinue IV Unasyn and IV steroids  Start oral levaquin abx and oral steroids  -Diabetic ketoacidosis resolved Off IV insulin drip On lantus insulin and sliding scale coverage  -Metabolic acidosis resolved  -Atrial fibrillation on diltiazem for rate control  -Tobacco abuse Counseled to quit smoking earlier in the admission  -Hyperkalemia resolved  -Alcohol abuse No withdrawals  -Elevated troponin secondary to demand ischemia  -Acute kidney injury secondary to dehydration resolved  - Patient could not be discharged home today as oxygen could not be arranged  - Possible DC in am once oxygen is arranged with home health sevices  All the records are reviewed and case discussed with Care Management/Social Worker. Management plans discussed with the patient, family and they are in agreement.  CODE STATUS: Full code  DVT Prophylaxis: SCDs  TOTAL TIME TAKING CARE OF THIS PATIENT: 34 minutes.   Saundra Shelling M.D on 02/13/2018 at 8:53 PM  Between 7am to 6pm - Pager - [phone]  After 6pm go to [email or web address] - password [email or web address]  [facility] [place] [facility]  Office  [phone]  CC: Primary care physician; [name], NP  Note: This dictation was prepared with Dragon dictation along with smaller phrase technology. Any transcriptional errors that result from this process are unintentional.

## 2018-02-13 NOTE — Progress Notes (Signed)
Occupational Therapy Treatment Patient Details Name: Michaela Johnson MRN: [id number] DOB: 1960/10/16 Today's Date: 02/13/2018    History of present illness Pt is a 57 y.o. female presenting to hospital 02/03/18 with DKA, acute metabolic encephalopathy, hyperkalemia, pseudohyponatremia, elevated troponin secondary demand ischemia from DKA, acute renal failure, acute metabolic acidosis.  Pt with recent hospital admission for DKA 7/12-7/14.  PMH includes DM, alcohol use, depression, htn, paroxysmal a-fib, neuropathy, seizure, and tobacco use.   OT comments  Pt seen for OT tx this date with focus on edu/training in learned energy conservation strategies and how to incorporate into daily routines and home environment, pursed lip breathing, and environmental/body positioning to minimize SOB and maximize safety/independence. Pt continues to benefit from skilled OT services in order to maximize return to PLOF. HHOT remains appropriate.    Follow Up Recommendations  Home health OT    Equipment Recommendations  Other (comment);3 in 1 bedside commode(reacher, LH shoe horn)    Recommendations for Other Services      Precautions / Restrictions Precautions Precautions: Fall Restrictions Weight Bearing Restrictions: No       Mobility Bed Mobility                Transfers              Balance                                         ADL either performed or assessed with clinical judgement   ADL                                               Vision Baseline Vision/History: Wears glasses Wears Glasses: At all times Patient Visual Report: No change from baseline     Perception     Praxis      Cognition Arousal/Alertness: Awake/alert Behavior During Therapy: WFL for tasks assessed/performed Overall Cognitive Status: Within Functional Limits for tasks assessed                                          Exercises Other  Exercises Other Exercises: Pt educated in energy conservation strategies including AE/DME, work simplification, home/routines modifications, and activity pacing with review of handout provided as well as additional instruction in pursed lip breathing to support independence/safety with mobility and ADL while minimizing over exertion/SOB.    Shoulder Instructions       General Comments      Pertinent Vitals/ Pain       Pain Assessment: No/denies pain(chronic back pain)  Home Living                                          Prior Functioning/Environment              Frequency  Min 1X/week        Progress Toward Goals  OT Goals(current goals can now be found in the care plan section)  Progress towards OT goals: Progressing toward goals  Acute Rehab OT Goals Patient Stated Goal: to get better and go home  OT Goal Formulation: With patient Time For Goal Achievement: 02/22/18 Potential to Achieve Goals: Good  Plan Discharge plan remains appropriate;Frequency remains appropriate    Co-evaluation                 AM-PAC PT "6 Clicks" Daily Activity     Outcome Measure   Help from another person eating meals?: None Help from another person taking care of personal grooming?: None Help from another person toileting, which includes using toliet, bedpan, or urinal?: A Little Help from another person bathing (including washing, rinsing, drying)?: A Little Help from another person to put on and taking off regular upper body clothing?: None Help from another person to put on and taking off regular lower body clothing?: A Little 6 Click Score: 21    End of Session    OT Visit Diagnosis: Other abnormalities of gait and mobility (R26.89);Muscle weakness (generalized) (M62.81)   Activity Tolerance Patient tolerated treatment well   Patient Left in chair;with call bell/phone within reach;with chair alarm set;with nursing/sitter in room   Nurse  Communication          Time: 1211-1220 OT Time Calculation (min): 9 min  Charges: OT General Charges $OT Visit: 1 Visit OT Treatments $Self Care/Home Management : 8-22 mins  Jeni Salles, MPH, MS, OTR/L ascom [phone] 02/13/18, 12:25 PM

## 2018-02-13 NOTE — Progress Notes (Signed)
Ambulated with PT. Started weaning O2 to 1L/[place]. 91% at rest.  Patient's oxygen saturation on 1L/[place] on ambulation was 83%. Placed back on 2L/[place]. Will push IS and notify MD.

## 2018-02-14 LAB — GLUCOSE, CAPILLARY
GLUCOSE-CAPILLARY: 269 mg/dL — AB (ref 70–99)
Glucose-Capillary: 285 mg/dL — ABNORMAL HIGH (ref 70–99)

## 2018-02-14 MED ORDER — ALBUTEROL SULFATE HFA 108 (90 BASE) MCG/ACT IN AERS: 2.0000 | INHALATION_SPRAY | Freq: Four times a day (QID) | RESPIRATORY_TRACT | 2 refills | Status: DC | PRN

## 2018-02-14 MED ORDER — PREDNISONE 10 MG PO TABS: 10.0000 mg | ORAL_TABLET | Freq: Every day | ORAL | 0 refills | Status: DC

## 2018-02-14 MED ORDER — SODIUM CHLORIDE 0.9 % IV SOLN
500.0000 mg | INTRAVENOUS | Status: DC
  Filled 2018-02-14: qty 500

## 2018-02-14 MED ORDER — SODIUM CHLORIDE 0.9 % IV SOLN
1.0000 g | INTRAVENOUS | Status: DC
  Filled 2018-02-14: qty 10

## 2018-02-14 MED ORDER — DIPHENHYDRAMINE HCL 50 MG/ML IJ SOLN
25.0000 mg | Freq: Once | INTRAMUSCULAR | Status: AC
  Administered 2018-02-14: 25 mg via INTRAVENOUS
  Filled 2018-02-14: qty 1

## 2018-02-14 NOTE — Progress Notes (Signed)
Discharge instructions and medication details reviewed with patient. Pt verbalizes understanding. All questions answered. Printed prescription and AVS given to patient. IV removed. Patient was escorted out via wheelchair.

## 2018-02-14 NOTE — Care Management Note (Signed)
Case Management Note  Patient Details  Name: Michaela Johnson MRN: [id number] Date of Birth: 05-28-1961  Subjective/Objective:   Patient to be discharge per MD order. Orders in place for home health services and DME. Patient is without payer. Sees Princella Ion for PCP services. Lives at home with spouse. Qualified for charity services via [facility], referral placed with Jermaine. Also, qualifying O2 sats were obtained. [facility] delivered walker and oxygen to patient. Family to provide transport home.  Ines Bloomer RN BSN RNCM [phone]                 Action/Plan:   Expected Discharge Date:  02/14/18               Expected Discharge Plan:  [place]  In-House Referral:     Discharge planning Services  CM Consult  Post Acute Care Choice:  Durable Medical Equipment, Home Health Choice offered to:  Patient  DME Arranged:  Walker rolling, Oxygen DME Agency:  [place] Arranged:  RN, PT, OT [facility] Agency:  [place]  Status of Service:  Completed, signed off  If discussed at [place] of Stay Meetings, dates discussed:    Additional Comments:  Latanya Maudlin, RN 02/14/2018, 11:38 AM

## 2018-02-14 NOTE — Progress Notes (Signed)
Levaquin was started on Pt at [place], stopped at [place] due to patient c/o "itching real bad", MD Jannifer Franklin notified, MD placed orders for Benadryl once and changed ABXs to Rocephin and Azithromycin. Will continue to monitor.

## 2018-02-14 NOTE — Progress Notes (Signed)
Patient left walker in the room at DC. Patient was called to her cellphone and she is aware. Patient will have someone come pick it up tomorrow.

## 2018-02-14 NOTE — Discharge Summary (Addendum)
[place] [facility] [place] NAME: Michaela Johnson    MR#:  [id number]  DATE OF BIRTH:  August 10, 1960  DATE OF ADMISSION:  02/03/2018 ADMITTING PHYSICIAN: Amelia Jo, MD  DATE OF DISCHARGE: 02/14/2018  PRIMARY CARE PHYSICIAN: [name], NP   ADMISSION DIAGNOSIS:  Hyperkalemia [E87.5] Diabetic ketoacidosis without coma associated with type 1 diabetes mellitus ([place]) [Y63.78] Acute metabolic encephalopathy Elevated troponin level Acute kidney injury Metabolic acidosis DISCHARGE DIAGNOSIS:  Active Problems:   DKA (diabetic ketoacidoses) (HCC) Hyperkalemia Elevated troponin secondary to demand ischemia Metabolic encephalopathy Acute kidney injury Metabolic acidosis Hypoxia secondary to chronic tobacco abuse/New onset COPD Tobacco abuse SECONDARY DIAGNOSIS:   Past Medical History:  Diagnosis Date  . Alcohol use   . Depression   . Diabetes mellitus without complication ([place])   . GERD (gastroesophageal reflux disease)   . Hypertension   . Neuropathy   . Tobacco use      ADMITTING HISTORY  Michaela Johnson  is a 57 y.o. female with a known history of tobacco and alcohol abuse, diabetes, hypertension, GERD, peripheral neuropathy, depression disorder.Patient is currently too lethargic, unable to provide reliable history.  Most of the information was taken from reviewing the medical chart and discussion with emergency room physician.Patient was brought to emergency room for nausea, vomiting and generalized weakness and lethargy going on for the past 3 days, gradually getting worse.  Blood sugar at home, per husband was elevated at 585.  When checked in the emergency room, glucose level was noted to be at 958.  It is uncertain if patient has been compliant with taking her insulin.  The reminder of the blood test is notable for elevated potassium level is 7.2 low sodium level 117, elevated creatinine level of 1.96 troponin level is 0.07.  pH is lower than  6.9 per venous blood gas.  WBC is 23.6.  UA is negative for infection.EKG shows wide QRS and peaked T waves, likely related to hyperkalemia.  Patient was treated with calcium gluconate and albuterol treatment in the emergency room.Patient is admitted to intensive care unit for further evaluation and treatment.  HOSPITAL COURSE:  Patient was initially admitted to ICU started on insulin drip and IV fluids for diabetic ketoacidosis and dehydration and acute kidney injury.  Patient's potassium levels were closely monitored.  Elevated troponin was secondary to demand ischemia which was cycle.  Patient also received IV bicarbonate for metabolic acidosis.  She was put on CIWA protocol and thiamine replacement for alcohol abuse.  Patient was followed up by nephrology attending for renal failure and intensivist during the stay in ICU.  Patient's diabetic ketoacidosis improved her hyperkalemia resolved.  Metabolic acidosis also resolved she was worked up with echocardiogram which showed a normal ejection fraction.. Patient was hypoxic and continued on high flow nasal cannula.  Patient has a long history of tobacco abuse and probably has chronic underlying respiratory disorder.  She was given IV Unasyn antibiotic for possible aspiration but cultures were negative.  She had also received IV Solu-Medrol during her stay in the hospital.Tobacco cessation was counseled to the patient.  She was moved to the medical floor.  Oxygen weaning was tried but patient becomes hypoxic on room air.  She needs oxygen via nasal cannula at least at 2 L.  Patient qualified for home oxygen.  She completed course of antibiotics during the stay in the hospital.  Cultures did not reveal any growth.  She will be discharged home on oxygen via  nasal cannula with tapering dose of steroids.  CONSULTS OBTAINED:  Treatment Team:  Hillary Bow, MD Saundra Shelling, MD  DRUG ALLERGIES:   Allergies  Allergen Reactions  . Levaquin [Levofloxacin In  D5w] Itching    DISCHARGE MEDICATIONS:   Allergies as of 02/14/2018      Reactions   Levaquin [levofloxacin In D5w] Itching      Medication List    STOP taking these medications   cephALEXin 500 MG capsule Commonly known as:  KEFLEX     TAKE these medications   albuterol 108 (90 Base) MCG/ACT inhaler Commonly known as:  PROVENTIL HFA;VENTOLIN HFA Inhale 2 puffs into the lungs every 6 (six) hours as needed for wheezing or shortness of breath.   aspirin 81 MG chewable tablet Chew 1 tablet (81 mg total) by mouth daily.   diltiazem 90 MG tablet Commonly known as:  CARDIZEM Take 1 tablet (90 mg total) by mouth every 12 (twelve) hours.   Fish Oil 1000 MG Caps Take 1 tablet by mouth daily.   FLUoxetine 40 MG capsule Commonly known as:  PROZAC Take 40 mg by mouth daily.   hydrochlorothiazide 12.5 MG capsule Commonly known as:  MICROZIDE Take 1 capsule (12.5 mg total) by mouth daily.   insulin glargine 100 UNIT/ML injection Commonly known as:  LANTUS Inject 50 Units into the skin daily.   lisinopril 40 MG tablet Commonly known as:  PRINIVIL,ZESTRIL Take 40 mg by mouth daily.   MULTI-VITAMINS Tabs Take 1 tablet by mouth daily.   omeprazole 20 MG capsule Commonly known as:  PRILOSEC Take 1 capsule by mouth daily.   predniSONE 10 MG tablet Commonly known as:  DELTASONE Take 1 tablet (10 mg total) by mouth daily. Label  & dispense according to the schedule below.  6 tablets day one, then 5 table day 2, then 4 tablets day 3, then 3 tablets day 4, 2 tablets day 5, then 1 tablet day 6, then stop   Vitamin D3 400 units Caps Take 1 capsule by mouth daily.            Durable Medical Equipment  (From admission, onward)        Start     Ordered   02/14/18 0957  For home use only DME Walker rolling  Once    Question:  Patient needs a walker to treat with the following condition  Answer:  Weakness   02/14/18 0957   02/13/18 1311  For home use only DME oxygen  Once     Question Answer Comment  Mode or (Route) Nasal cannula   Liters per Minute 2   Oxygen conserving device Yes   Oxygen delivery system Gas      02/13/18 1310      Today  Patient seen and evaluated on the day of discharge Comfortable on oxygen via nasal cannula at 2 L No shortness of breath No cough No fever VITAL SIGNS:  Blood pressure (!) 171/90, pulse 89, temperature 98.2 F (36.8 C), temperature source Oral, resp. rate 18, height 5\' 2"  (1.575 m), weight 65.5 kg (144 lb 6.4 oz), SpO2 96 %.  I/O:    Intake/Output Summary (Last 24 hours) at 02/14/2018 1318 Last data filed at 02/14/2018 1045 Gross per 24 hour  Intake 543.33 ml  Output -  Net 543.33 ml    PHYSICAL EXAMINATION:  Physical Exam  GENERAL:  57 y.o.-year-old patient lying in the bed with no acute distress.  LUNGS: Normal breath sounds bilaterally,  no wheezing, rales,rhonchi or crepitation. No use of accessory muscles of respiration.  CARDIOVASCULAR: S1, S2 normal. No murmurs, rubs, or gallops.  ABDOMEN: Soft, non-tender, non-distended. Bowel sounds present. No organomegaly or mass.  NEUROLOGIC: Moves all 4 extremities. PSYCHIATRIC: The patient is alert and oriented x 3.  SKIN: No obvious rash, lesion, or ulcer.   DATA REVIEW:   CBC Recent Labs  Lab 02/12/18 0420  WBC 10.8  HGB 11.3*  HCT 32.3*  PLT 553*    Chemistries  Recent Labs  Lab 02/12/18 0420  NA 133*  K 4.9  CL 93*  CO2 33*  GLUCOSE 250*  BUN 22*  CREATININE 0.58  CALCIUM 9.0    Cardiac Enzymes No results for input(s): TROPONINI in the last 168 hours.  Microbiology Results  Results for orders placed or performed during the hospital encounter of 02/03/18  MRSA PCR Screening     Status: None   Collection Time: 02/04/18 12:32 AM  Result Value Ref Range Status   MRSA by PCR NEGATIVE NEGATIVE Final    Comment:        The GeneXpert MRSA Assay (FDA approved for NASAL specimens only), is one component of a comprehensive MRSA  colonization surveillance program. It is not intended to diagnose MRSA infection nor to guide or monitor treatment for MRSA infections. Performed at [facility], [address] [place], [place] [address]   CULTURE, BLOOD (ROUTINE X 2) w Reflex to ID Panel     Status: None   Collection Time: 02/04/18  5:55 AM  Result Value Ref Range Status   Specimen Description BLOOD RIGHT ANTECUBITAL  Final   Special Requests   Final    BOTTLES DRAWN AEROBIC AND ANAEROBIC Blood Culture adequate volume   Culture   Final    NO GROWTH 5 DAYS Performed at [facility], [address] [place], [place] [address]    Report Status 02/09/2018 FINAL  Final  CULTURE, BLOOD (ROUTINE X 2) w Reflex to ID Panel     Status: None   Collection Time: 02/04/18  6:01 AM  Result Value Ref Range Status   Specimen Description BLOOD BLOOD RIGHT HAND  Final   Special Requests   Final    BOTTLES DRAWN AEROBIC AND ANAEROBIC Blood Culture results may not be optimal due to an inadequate volume of blood received in culture bottles   Culture   Final    NO GROWTH 5 DAYS Performed at [facility], [address], [place], [place] [address]    Report Status 02/09/2018 FINAL  Final  Urine Culture     Status: None   Collection Time: 02/04/18 10:48 AM  Result Value Ref Range Status   Specimen Description   Final    URINE, RANDOM Performed at [facility], [address], [place], [place] [address]    Special Requests   Final    NONE Performed at [facility], [address] [place], [place] [address]    Culture   Final    NO GROWTH Performed at [facility], [address], [place], [place] [address]    Report Status 02/05/2018 FINAL  Final    RADIOLOGY:  No results found.  Follow up with PCP in 1 week.  Management plans discussed with the patient, family and they are in agreement.  CODE STATUS: Full code    Code Status Orders  (From admission, onward)         Start  Ordered   02/04/18 0008  Full code  Continuous     02/04/18 0008    Code Status History    Date Active Date Inactive Code Status Order ID Comments User Context   01/29/2018 0738 01/31/2018 2059 Full Code [id number]  Loletha Grayer, MD ED   04/15/2017 1552 04/17/2017 1859 Full Code [id number]  Gladstone Lighter, MD Inpatient   04/15/2017 1537 04/15/2017 1552 Full Code [id number]  Awilda Bill, NP ED   06/07/2016 1349 06/10/2016 1516 Full Code [id number]  Bettey Costa, MD ED   06/07/2016 1349 06/07/2016 1349 Full Code [id number]  Bettey Costa, MD ED      TOTAL TIME TAKING CARE OF THIS PATIENT ON DAY OF DISCHARGE: more than 35 minutes.   Saundra Shelling M.D on 02/14/2018 at 1:18 PM  Between 7am to 6pm - Pager - [phone]  After 6pm go to [email or web address] - password [email or web address] [facility]  [facility]  Office  [phone]  CC: Primary care physician; [name], NP  Note: This dictation was prepared with Dragon dictation along with smaller phrase technology. Any transcriptional errors that result from this process are unintentional.

## 2018-02-21 ENCOUNTER — Inpatient Hospital Stay
Admission: EM | Admit: 2018-02-21 | Discharge: 2018-02-22 | DRG: 637 | Disposition: A | Discharge status: Home / Self Care | Payer: Self-pay | Attending: Internal Medicine | Admitting: Internal Medicine

## 2018-02-21 ENCOUNTER — Emergency Department: Payer: Self-pay

## 2018-02-21 ENCOUNTER — Other Ambulatory Visit: Payer: Self-pay

## 2018-02-21 DIAGNOSIS — Z833 Family history of diabetes mellitus: Secondary | ICD-10-CM

## 2018-02-21 DIAGNOSIS — F17210 Nicotine dependence, cigarettes, uncomplicated: Secondary | ICD-10-CM | POA: Diagnosis present

## 2018-02-21 DIAGNOSIS — Z7982 Long term (current) use of aspirin: Secondary | ICD-10-CM

## 2018-02-21 DIAGNOSIS — I10 Essential (primary) hypertension: Secondary | ICD-10-CM | POA: Diagnosis present

## 2018-02-21 DIAGNOSIS — E871 Hypo-osmolality and hyponatremia: Secondary | ICD-10-CM | POA: Diagnosis present

## 2018-02-21 DIAGNOSIS — G9341 Metabolic encephalopathy: Secondary | ICD-10-CM | POA: Diagnosis present

## 2018-02-21 DIAGNOSIS — E1011 Type 1 diabetes mellitus with ketoacidosis with coma: Secondary | ICD-10-CM

## 2018-02-21 DIAGNOSIS — I480 Paroxysmal atrial fibrillation: Secondary | ICD-10-CM | POA: Diagnosis present

## 2018-02-21 DIAGNOSIS — F1010 Alcohol abuse, uncomplicated: Secondary | ICD-10-CM | POA: Diagnosis present

## 2018-02-21 DIAGNOSIS — Z8249 Family history of ischemic heart disease and other diseases of the circulatory system: Secondary | ICD-10-CM

## 2018-02-21 DIAGNOSIS — E1310 Other specified diabetes mellitus with ketoacidosis without coma: Secondary | ICD-10-CM

## 2018-02-21 DIAGNOSIS — N179 Acute kidney failure, unspecified: Secondary | ICD-10-CM | POA: Diagnosis present

## 2018-02-21 DIAGNOSIS — I248 Other forms of acute ischemic heart disease: Secondary | ICD-10-CM | POA: Diagnosis present

## 2018-02-21 DIAGNOSIS — Z794 Long term (current) use of insulin: Secondary | ICD-10-CM

## 2018-02-21 DIAGNOSIS — E873 Alkalosis: Secondary | ICD-10-CM | POA: Diagnosis present

## 2018-02-21 DIAGNOSIS — E860 Dehydration: Secondary | ICD-10-CM | POA: Diagnosis present

## 2018-02-21 DIAGNOSIS — E1110 Type 2 diabetes mellitus with ketoacidosis without coma: Principal | ICD-10-CM | POA: Diagnosis present

## 2018-02-21 DIAGNOSIS — E875 Hyperkalemia: Secondary | ICD-10-CM

## 2018-02-21 DIAGNOSIS — K219 Gastro-esophageal reflux disease without esophagitis: Secondary | ICD-10-CM | POA: Diagnosis present

## 2018-02-21 DIAGNOSIS — R7989 Other specified abnormal findings of blood chemistry: Secondary | ICD-10-CM

## 2018-02-21 HISTORY — DX: Paroxysmal atrial fibrillation: I48.0

## 2018-02-21 LAB — TROPONIN I
TROPONIN I: 0.22 ng/mL — AB (ref <0.03)
Troponin I: <0.03 ng/mL (ref <0.03)
Troponin I: 0.03 ng/mL (ref <0.03)

## 2018-02-21 LAB — URINALYSIS, COMPLETE (UACMP) WITH MICROSCOPIC
Bilirubin Urine: NEGATIVE
Ketones, ur: 80 mg/dL — AB
NITRITE: NEGATIVE
Protein, ur: NEGATIVE mg/dL
Specific Gravity, Urine: 1.017 (ref 1.005–1.030)
pH: 5 (ref 5.0–8.0)

## 2018-02-21 LAB — LACTIC ACID, PLASMA
LACTIC ACID, VENOUS: 3.3 mmol/L — AB (ref 0.5–1.9)
Lactic Acid, Venous: 2.7 mmol/L (ref 0.5–1.9)

## 2018-02-21 LAB — GLUCOSE, CAPILLARY
GLUCOSE-CAPILLARY: 484 mg/dL — AB (ref 70–99)
GLUCOSE-CAPILLARY: 263 mg/dL — AB (ref 70–99)
Glucose-Capillary: 199 mg/dL — ABNORMAL HIGH (ref 70–99)
Glucose-Capillary: 208 mg/dL — ABNORMAL HIGH (ref 70–99)
Glucose-Capillary: 233 mg/dL — ABNORMAL HIGH (ref 70–99)
Glucose-Capillary: 318 mg/dL — ABNORMAL HIGH (ref 70–99)
Glucose-Capillary: 360 mg/dL — ABNORMAL HIGH (ref 70–99)
Glucose-Capillary: 457 mg/dL — ABNORMAL HIGH (ref 70–99)
Glucose-Capillary: 583 mg/dL (ref 70–99)
Glucose-Capillary: >600 mg/dL (ref 70–99)

## 2018-02-21 LAB — URINE DRUG SCREEN, QUALITATIVE (ARMC ONLY)
AMPHETAMINES, UR SCREEN: NOT DETECTED
BARBITURATES, UR SCREEN: NOT DETECTED
Cannabinoid 50 Ng, Ur ~~LOC~~: NOT DETECTED
Cocaine Metabolite,Ur ~~LOC~~: NOT DETECTED
MDMA (Ecstasy)Ur Screen: NOT DETECTED
METHADONE SCREEN, URINE: NOT DETECTED
OPIATE, UR SCREEN: NOT DETECTED
PHENCYCLIDINE (PCP) UR S: NOT DETECTED
Tricyclic, Ur Screen: NOT DETECTED

## 2018-02-21 LAB — CBC
HCT: 33.8 % — ABNORMAL LOW (ref 35.0–47.0)
HEMATOCRIT: 35.4 % (ref 35.0–47.0)
HEMOGLOBIN: 10.6 g/dL — AB (ref 12.0–16.0)
Hemoglobin: 11.1 g/dL — ABNORMAL LOW (ref 12.0–16.0)
MCH: 34.3 pg — ABNORMAL HIGH (ref 26.0–34.0)
MCH: 33.2 pg (ref 26.0–34.0)
MCHC: 31.4 g/dL — ABNORMAL LOW (ref 32.0–36.0)
MCHC: 31.4 g/dL — ABNORMAL LOW (ref 32.0–36.0)
MCV: 109.1 fL — AB (ref 80.0–100.0)
MCV: 105.4 fL — ABNORMAL HIGH (ref 80.0–100.0)
PLATELETS: 398 10*3/uL (ref 150–440)
PLATELETS: 397 10*3/uL (ref 150–440)
RBC: 3.25 MIL/uL — AB (ref 3.80–5.20)
RBC: 3.21 MIL/uL — AB (ref 3.80–5.20)
RDW: 15.4 % — ABNORMAL HIGH (ref 11.5–14.5)
RDW: 14.7 % — ABNORMAL HIGH (ref 11.5–14.5)
WBC: 21.2 10*3/uL — AB (ref 3.6–11.0)
WBC: 30.4 10*3/uL — AB (ref 3.6–11.0)

## 2018-02-21 LAB — BASIC METABOLIC PANEL
ANION GAP: 25 — AB (ref 5–15)
ANION GAP: 13 (ref 5–15)
BUN: 27 mg/dL — ABNORMAL HIGH (ref 6–20)
BUN: 24 mg/dL — ABNORMAL HIGH (ref 6–20)
CALCIUM: 8.4 mg/dL — AB (ref 8.9–10.3)
CALCIUM: 8 mg/dL — AB (ref 8.9–10.3)
CO2: 12 mmol/L — AB (ref 22–32)
CO2: 21 mmol/L — AB (ref 22–32)
CREATININE: 1.43 mg/dL — AB (ref 0.44–1.00)
CREATININE: 1.13 mg/dL — AB (ref 0.44–1.00)
Chloride: 98 mmol/L (ref 98–111)
Chloride: 102 mmol/L (ref 98–111)
GFR calc Af Amer: 46 mL/min — ABNORMAL LOW (ref >60)
GFR, EST NON AFRICAN AMERICAN: 40 mL/min — AB (ref >60)
GFR, EST NON AFRICAN AMERICAN: 53 mL/min — AB (ref >60)
Glucose, Bld: 524 mg/dL (ref 70–99)
Glucose, Bld: 287 mg/dL — ABNORMAL HIGH (ref 70–99)
Potassium: 4.3 mmol/L (ref 3.5–5.1)
Potassium: 4.2 mmol/L (ref 3.5–5.1)
SODIUM: 136 mmol/L (ref 135–145)
Sodium: 135 mmol/L (ref 135–145)

## 2018-02-21 LAB — BLOOD GAS, VENOUS
ACID-BASE DEFICIT: 19.4 mmol/L — AB (ref 0.0–2.0)
Bicarbonate: 7.5 mmol/L — ABNORMAL LOW (ref 20.0–28.0)
O2 SAT: 44.3 %
PATIENT TEMPERATURE: 37
PO2 VEN: 33 mmHg (ref 32.0–45.0)
pCO2, Ven: 21 mmHg — ABNORMAL LOW (ref 44.0–60.0)
pH, Ven: 7.16 — CL (ref 7.250–7.430)

## 2018-02-21 LAB — PROTIME-INR
INR: 1.01
Prothrombin Time: 13.2 seconds (ref 11.4–15.2)

## 2018-02-21 LAB — AMMONIA: AMMONIA: 19 umol/L (ref 9–35)

## 2018-02-21 LAB — MAGNESIUM: MAGNESIUM: 2.8 mg/dL — AB (ref 1.7–2.4)

## 2018-02-21 LAB — LIPASE, BLOOD: LIPASE: 45 U/L (ref 11–51)

## 2018-02-21 MED ORDER — AMIODARONE LOAD VIA INFUSION
150.0000 mg | Freq: Once | INTRAVENOUS | Status: AC
  Administered 2018-02-21: 150 mg via INTRAVENOUS
  Filled 2018-02-21: qty 83.34

## 2018-02-21 MED ORDER — SODIUM CHLORIDE 0.9 % IV SOLN
1.0000 g | Freq: Once | INTRAVENOUS | Status: AC
  Administered 2018-02-21: 1 g via INTRAVENOUS
  Filled 2018-02-21: qty 10

## 2018-02-21 MED ORDER — SODIUM CHLORIDE 0.9 % IV SOLN
INTRAVENOUS | Status: DC
  Administered 2018-02-21: 15 [IU]/h via INTRAVENOUS
  Filled 2018-02-21 (×2): qty 1

## 2018-02-21 MED ORDER — SODIUM CHLORIDE 0.9 % IV BOLUS
1000.0000 mL | Freq: Once | INTRAVENOUS | Status: AC
  Administered 2018-02-21: 1000 mL via INTRAVENOUS

## 2018-02-21 MED ORDER — DEXTROSE-NACL 5-0.45 % IV SOLN
INTRAVENOUS | Status: DC
  Administered 2018-02-21: 22:00:00 via INTRAVENOUS

## 2018-02-21 MED ORDER — HEPARIN SODIUM (PORCINE) 5000 UNIT/ML IJ SOLN
5000.0000 [IU] | Freq: Three times a day (TID) | INTRAMUSCULAR | Status: DC
  Administered 2018-02-21 – 2018-02-22 (×2): 5000 [IU] via SUBCUTANEOUS
  Filled 2018-02-21 (×2): qty 1

## 2018-02-21 MED ORDER — ONDANSETRON HCL 4 MG/2ML IJ SOLN
4.0000 mg | Freq: Four times a day (QID) | INTRAMUSCULAR | Status: DC | PRN
  Administered 2018-02-21 – 2018-02-22 (×2): 4 mg via INTRAVENOUS
  Filled 2018-02-21 (×2): qty 2

## 2018-02-21 MED ORDER — FAMOTIDINE IN NACL 20-0.9 MG/50ML-% IV SOLN
20.0000 mg | Freq: Two times a day (BID) | INTRAVENOUS | Status: DC
  Administered 2018-02-21 (×2): 20 mg via INTRAVENOUS
  Filled 2018-02-21: qty 50

## 2018-02-21 MED ORDER — SODIUM CHLORIDE 0.9 % IV SOLN: INTRAVENOUS | Status: DC

## 2018-02-21 MED ORDER — SODIUM CHLORIDE 0.9 % IV SOLN
INTRAVENOUS | Status: DC
  Administered 2018-02-21: 5.4 [IU]/h via INTRAVENOUS
  Filled 2018-02-21: qty 1

## 2018-02-21 MED ORDER — DEXTROSE-NACL 5-0.45 % IV SOLN: INTRAVENOUS | Status: DC

## 2018-02-21 MED ORDER — SODIUM CHLORIDE 0.9 % IV BOLUS
500.0000 mL | Freq: Once | INTRAVENOUS | Status: AC
  Administered 2018-02-21: 500 mL via INTRAVENOUS

## 2018-02-21 MED ORDER — DIGOXIN 0.25 MG/ML IJ SOLN
0.5000 mg | Freq: Once | INTRAMUSCULAR | Status: AC
  Administered 2018-02-21: 0.5 mg via INTRAVENOUS
  Filled 2018-02-21: qty 2

## 2018-02-21 MED ORDER — FENTANYL CITRATE (PF) 100 MCG/2ML IJ SOLN
25.0000 ug | Freq: Once | INTRAMUSCULAR | Status: AC
  Administered 2018-02-21: 25 ug via INTRAVENOUS
  Filled 2018-02-21: qty 2

## 2018-02-21 MED ORDER — SODIUM CHLORIDE 0.9 % IV SOLN: INTRAVENOUS | Status: AC

## 2018-02-21 MED ORDER — INSULIN ASPART 100 UNIT/ML ~~LOC~~ SOLN
10.0000 [IU] | Freq: Once | SUBCUTANEOUS | Status: AC
  Administered 2018-02-21: 10 [IU] via INTRAVENOUS
  Filled 2018-02-21: qty 1

## 2018-02-21 MED ORDER — ASPIRIN 81 MG PO CHEW: 81.0000 mg | CHEWABLE_TABLET | Freq: Every day | ORAL | Status: DC

## 2018-02-21 MED ORDER — AMIODARONE HCL IN DEXTROSE 360-4.14 MG/200ML-% IV SOLN
30.0000 mg/h | INTRAVENOUS | Status: DC
  Administered 2018-02-21 – 2018-02-22 (×2): 30 mg/h via INTRAVENOUS
  Filled 2018-02-21: qty 200

## 2018-02-21 MED ORDER — IPRATROPIUM-ALBUTEROL 0.5-2.5 (3) MG/3ML IN SOLN
3.0000 mL | Freq: Four times a day (QID) | RESPIRATORY_TRACT | Status: DC
  Administered 2018-02-21 – 2018-02-22 (×3): 3 mL via RESPIRATORY_TRACT
  Filled 2018-02-21 (×4): qty 3

## 2018-02-21 MED ORDER — AMIODARONE HCL IN DEXTROSE 360-4.14 MG/200ML-% IV SOLN
60.0000 mg/h | INTRAVENOUS | Status: AC
  Administered 2018-02-21: 60 mg/h via INTRAVENOUS
  Filled 2018-02-21: qty 200

## 2018-02-21 MED ORDER — SODIUM BICARBONATE 8.4 % IV SOLN
50.0000 meq | Freq: Once | INTRAVENOUS | Status: AC
  Administered 2018-02-21: 50 meq via INTRAVENOUS
  Filled 2018-02-21: qty 50

## 2018-02-21 MED ORDER — DILTIAZEM HCL 60 MG PO TABS
90.0000 mg | ORAL_TABLET | Freq: Two times a day (BID) | ORAL | Status: DC
  Administered 2018-02-22: 90 mg via ORAL
  Filled 2018-02-21: qty 1

## 2018-02-21 MED ORDER — ASPIRIN 300 MG RE SUPP
150.0000 mg | Freq: Every day | RECTAL | Status: DC
  Administered 2018-02-21: 150 mg via RECTAL
  Filled 2018-02-21: qty 1

## 2018-02-21 MED ORDER — AMIODARONE HCL IN DEXTROSE 360-4.14 MG/200ML-% IV SOLN
INTRAVENOUS | Status: AC
  Administered 2018-02-21: 150 mg via INTRAVENOUS
  Filled 2018-02-21: qty 200

## 2018-02-21 NOTE — Progress Notes (Signed)
[place] Progress Note Patient Name: Michaela Johnson DOB: Aug 07, 1960 MRN: [id number]   Date of Service  02/21/2018  HPI/Events of Note  Troponin = 0.22 - Related to elevated HR and demand ischemia? Currently on ASA 81 mg PO daily. However, the patient is nauseated.   eICU Interventions  Will order: 1. Change ASA to 150 mg suppository PR now and Q Day.  2. 12 Lead EKG now. 3. Continue to trend Troponin.      Intervention Category Intermediate Interventions: Diagnostic test evaluation  Sommer,Steven [name] 02/21/2018, 9:44 PM

## 2018-02-21 NOTE — Consult Note (Signed)
Reason for Consult:DKA Referring Physician: Dr. Nedra Hai is an 57 y.o. female.  HPI: Mrs. Basher is a 57 year old female with a past medical history remarkable for hypertension, gastroesophageal reflux disease, depression, alcohol use, recurrent episodes of diabetic ketoacidosis, was recently admitted to the hospital re presents with shortness of breath, nausea, vomiting, abdominal pain over the last 24 hours upon presentation to the emergency room her blood work revealed a blood sugar of 869, lactic acid of 3.3, sodium 128 potassium 6.8, anion gap of 36 with a white count 21,000 consistent with an episode of recurrent DKA. Patient is presently tachypneic, tachycardic and hypotensive receiving fluid resuscitation and insulin infusion  Past Medical History:  Diagnosis Date  . Alcohol use   . Depression   . Diabetes mellitus without complication ([place])   . GERD (gastroesophageal reflux disease)   . Hypertension   . Neuropathy   . Tobacco use     Past Surgical History:  Procedure Laterality Date  . NO PAST SURGERIES      Family History  Problem Relation Age of Onset  . Diabetes Mother   . CAD Father     Social History:  reports that she has been smoking.  She has a 41.00 pack-year smoking history. She has never used smokeless tobacco. She reports that she drinks alcohol. She reports that she has current or past drug history. Drug: Marijuana.  Allergies:  Allergies  Allergen Reactions  . Levaquin [Levofloxacin In D5w] Itching    Medications: I have reviewed the patient's current medications.  Results for orders placed or performed during the hospital encounter of 02/21/18 (from the past 48 hour(s))  Blood gas, venous     Status: Abnormal   Collection Time: 02/21/18  9:59 AM  Result Value Ref Range   pH, Ven 7.16 (LL) 7.250 - 7.430    Comment: CRITICAL RESULT CALLED TO, READ BACK BY AND VERIFIED WITH: CRITICAL VALUE 02/21/18,1047,DR. QUALE/FD    pCO2, Ven 21 (L)  44.0 - 60.0 mmHg   pO2, Ven 33.0 32.0 - 45.0 mmHg   Bicarbonate 7.5 (L) 20.0 - 28.0 mmol/L   Acid-base deficit 19.4 (H) 0.0 - 2.0 mmol/L   O2 Saturation 44.3 %   Patient temperature 37.0    Collection site VEIN    Sample type VEIN     Comment: Performed at [facility], [address] [place], [place] [address]  Glucose, capillary     Status: Abnormal   Collection Time: 02/21/18 10:01 AM  Result Value Ref Range   Glucose-Capillary >600 (HH) 70 - 99 mg/dL  Lactic acid, plasma     Status: Abnormal   Collection Time: 02/21/18 10:21 AM  Result Value Ref Range   Lactic Acid, Venous 3.3 (HH) 0.5 - 1.9 mmol/L    Comment: CRITICAL RESULT CALLED TO, READ BACK BY AND VERIFIED WITH [facility] WHITE AT 0865 01/25/45 DAS Performed at [facility], [address] [place], [place] [address]   CBC     Status: Abnormal   Collection Time: 02/21/18 10:38 AM  Result Value Ref Range   WBC 21.2 (H) 3.6 - 11.0 K/uL   RBC 3.25 (L) 3.80 - 5.20 MIL/uL   Hemoglobin 11.1 (L) 12.0 - 16.0 g/dL   HCT 35.4 35.0 - 47.0 %   MCV 109.1 (H) 80.0 - 100.0 fL   MCH 34.3 (H) 26.0 - 34.0 pg   MCHC 31.4 (L) 32.0 - 36.0 g/dL   RDW 15.4 (H) 11.5 - 14.5 %  Platelets 398 150 - 440 K/uL    Comment: Performed at [facility], [address] [place], [place] [address]  Comprehensive metabolic panel     Status: Abnormal   Collection Time: 02/21/18 10:38 AM  Result Value Ref Range   Sodium 128 (L) 135 - 145 mmol/L    Comment: LYTES REPEATED DAS   Potassium 6.8 (HH) 3.5 - 5.1 mmol/L    Comment: CRITICAL RESULT CALLED TO, READ BACK BY AND VERIFIED WITH [facility] WHITE AT 7116 11/24/88 DAS    Chloride 84 (L) 98 - 111 mmol/L   CO2 8 (L) 22 - 32 mmol/L   Glucose, Bld 869 (HH) 70 - 99 mg/dL    Comment: CRITICAL RESULT CALLED TO, READ BACK BY AND VERIFIED WITH DELICIA WHITE AT 09/26/31 DAS    BUN 28 (H) 6 - 20 mg/dL   Creatinine, Ser 1.50 (H) 0.44 - 1.00 mg/dL   Calcium 9.4 8.9 - 10.3 mg/dL   Total  Protein 6.5 6.5 - 8.1 g/dL   Albumin 3.2 (L) 3.5 - 5.0 g/dL   AST 17 15 - 41 U/L   ALT 23 0 - 44 U/L   Alkaline Phosphatase 119 38 - 126 U/L   Total Bilirubin 2.5 (H) 0.3 - 1.2 mg/dL   GFR calc non Af Amer 37 (L) >60 mL/min   GFR calc Af Amer 43 (L) >60 mL/min    Comment: (NOTE) The eGFR has been calculated using the CKD EPI equation. This calculation has not been validated in all clinical situations. eGFR's persistently <60 mL/min signify possible Chronic Kidney Disease.    Anion gap 36 (H) 5 - 15    Comment: Performed at [facility], [address] [place], [place] [address]  Lipase, blood     Status: None   Collection Time: 02/21/18 10:38 AM  Result Value Ref Range   Lipase 45 11 - 51 U/L    Comment: Performed at [facility], [address] [place], [place] [address]  Troponin I     Status: None   Collection Time: 02/21/18 10:38 AM  Result Value Ref Range   Troponin I <0.03 <0.03 ng/mL    Comment: Performed at [facility], [address] [place], [place] [address]  Protime-INR     Status: None   Collection Time: 02/21/18 10:38 AM  Result Value Ref Range   Prothrombin Time 13.2 11.4 - 15.2 seconds   INR 1.01     Comment: Performed at [facility], [address] [place], [place] [address]    Dg Chest Port 1 View  Result Date: 02/21/2018 CLINICAL DATA:  Nausea and vomiting with shortness of breath EXAM: PORTABLE CHEST 1 VIEW COMPARISON:  02/12/2018 FINDINGS: Cardiac shadow is within normal limits. Mild aortic calcifications are noted. Diffuse interstitial changes are again identified and stable. No focal infiltrate is noted. No bony abnormality is seen. IMPRESSION: Stable interstitial changes when compare with the prior exam. Electronically Signed   By: Inez Catalina M.D.   On: 02/21/2018 10:21    ROS Blood pressure (!) 103/41, pulse (!) 113, temperature 98.1 F (36.7 C), resp. rate (!) 29, height '5\' 2"'  (1.575 m), weight 144 lb  (65.3 kg), SpO2 100 %. Physical Exam  General:ill-appearing female, presently moaning, moves all extremities, presently tachycardic in the 130s with marginal blood pressure Vital signs:please see the above listed vital signs HEENT:dry mucosa, no oral lesions, trachea midline, flat neck veins Cardiovascular:irregularly irregular rhythm, tachycardia with underlying  bundle branch block noted on monitor Pulmonary:clear to auscultation Abdominal:positive bowel sounds, soft exam Musculoskeletal:no clubbing cyanosis or edema noted Cutaneous:no rashes or lesions noted Neurologic:was all extremities, awake and communicating  Assessment/Plan:  Recurrent DKA.  Patient states that she has been compliant with her medication and her diet. Laboratory abnormalities are consistent, hyponatremia secondary to hyperglycemia, hyperkalemia which should shift after acid-base status insulin and hydration, lactic acidosis secondary to hypotension and hypoperfusion, renal insufficiency most likely secondary to hypoperfusion, acid-based derangement with an anion gap of 36, delta gap of 24, calculated starting bicarbonate of 32 consistent with an initial metabolic alkalosis and subsequent anion gap metabolic acidosis with appropriate respiratory compensation based on Winters formula. Leukocytosis most likely reactive. Will be admitted to the intensive care unit fluid resuscitation close follow-up electrolytes to include potassium magnesium and phosphorus, DKA protocol, insulin infusion and follow-up on all laboratory studies.her EKG revealed tachycardia, interventricular conduction delay, repolarization abnormalities. On prior tracing she does have a left bundle branch block. Will check troponin and follow closely  Zaila Crew 02/21/2018, 12:18 PM

## 2018-02-21 NOTE — H&P (Signed)
[name] NAME: Michaela Johnson    MR#:  [id number]  DATE OF BIRTH:  [birth year]  DATE OF ADMISSION:  02/21/2018  PRIMARY CARE PHYSICIAN: Michaela Finner, NP   REQUESTING/REFERRING PHYSICIAN:   CHIEF COMPLAINT:   Chief Complaint  Patient presents with  . Emesis  . Shortness of Breath    HISTORY OF PRESENT ILLNESS: Michaela Johnson  is a 57 y.o. female with a known history per below, recently discharged from the hospital for acute DKA proximally 1 week ago, returns today with complaints of shortness of breath, nausea, vomiting, abdominal pain-all starting over the last 24 hours, in the emergency room patient was found to be in acute severe DKA with pH 7.1 on venous gas, blood sugar 869, lactic acid 3.3, sodium 128, chloride 84, potassium 6.8, anion gap 36, white count 21,000, chest x-ray unimpressive, urinalysis pending, patient noted to be tachycardic, tachypneic, hypotensive, patient evaluated at the bedside in the emergency room noted to have mild lethargy, able to answer questions, denies pain, patient states that she takes all her medications as directed and has been compliant with diet, patient is now been admitted for acute diabetic ketoacidosis with multiple electrolyte abnormalities, acute kidney injury.  PAST MEDICAL HISTORY:   Past Medical History:  Diagnosis Date  . Alcohol use   . Depression   . Diabetes mellitus without complication ([place])   . GERD (gastroesophageal reflux disease)   . Hypertension   . Neuropathy   . Tobacco use     PAST SURGICAL HISTORY:  Past Surgical History:  Procedure Laterality Date  . NO PAST SURGERIES      SOCIAL HISTORY:  Social History   Tobacco Use  . Smoking status: Current Some Day Smoker    Packs/day: 1.00    Years: 41.00    Pack years: 41.00  . Smokeless tobacco: Never Used  Substance Use Topics  . Alcohol use: Yes    Comment: 42 per week    FAMILY HISTORY:  Family History   Problem Relation Age of Onset  . Diabetes Mother   . CAD Father     DRUG ALLERGIES:  Allergies  Allergen Reactions  . Levaquin [Levofloxacin In D5w] Itching    REVIEW OF SYSTEMS:   CONSTITUTIONAL: No fever, +fatigue / weakness.  EYES: No blurred or double vision.  EARS, NOSE, AND THROAT: No tinnitus or ear pain.  RESPIRATORY: No cough, shortness of breath, wheezing or hemoptysis.  CARDIOVASCULAR: No chest pain, orthopnea, edema.  GASTROINTESTINAL: + nausea, vomiting, abdominal pain.  GENITOURINARY: No dysuria, hematuria.  ENDOCRINE: + polyuria, nocturia,  HEMATOLOGY: No anemia, easy bruising or bleeding SKIN: No rash or lesion. MUSCULOSKELETAL: No joint pain or arthritis.   NEUROLOGIC: No tingling, numbness, weakness.  PSYCHIATRY: No anxiety or depression.   MEDICATIONS AT HOME:  Prior to Admission medications   Medication Sig Start Date End Date Taking? Authorizing Provider  albuterol (PROVENTIL HFA;VENTOLIN HFA) 108 (90 Base) MCG/ACT inhaler Inhale 2 puffs into the lungs every 6 (six) hours as needed for wheezing or shortness of breath. 02/14/18  Yes Pyreddy, Reatha Harps, MD  aspirin 81 MG chewable tablet Chew 1 tablet (81 mg total) by mouth daily. 01/31/18  Yes Demetrios Loll, MD  Cholecalciferol (VITAMIN D3) 400 units CAPS Take 1 capsule by mouth daily.   Yes [provider]  diltiazem (CARDIZEM) 90 MG tablet Take 1 tablet (90 mg total) by mouth every 12 (twelve) hours. 01/31/18  Yes Demetrios Loll,  MD  FLUoxetine (PROZAC) 40 MG capsule Take 40 mg by mouth daily.   Yes [provider]  hydrochlorothiazide (MICROZIDE) 12.5 MG capsule Take 1 capsule (12.5 mg total) by mouth daily. 02/14/18 03/16/18 Yes Pyreddy, Reatha Harps, MD  insulin aspart (NOVOLOG) 100 UNIT/ML injection Inject into the skin 3 (three) times daily before meals.   Yes [provider]  insulin glargine (LANTUS) 100 UNIT/ML injection Inject 50 Units into the skin daily.   Yes [provider]   lisinopril (PRINIVIL,ZESTRIL) 40 MG tablet Take 40 mg by mouth daily.   Yes [provider]  Multiple Vitamin (MULTI-VITAMINS) TABS Take 1 tablet by mouth daily.   Yes [provider]  Omega-3 Fatty Acids (FISH OIL) 1000 MG CAPS Take 1 tablet by mouth daily.   Yes [provider]  omeprazole (PRILOSEC) 20 MG capsule Take 1 capsule by mouth daily.   Yes [provider]      PHYSICAL EXAMINATION:   VITAL SIGNS: Blood pressure (!) 103/41, pulse (!) 113, temperature 98.1 F (36.7 C), resp. rate (!) 29, height 5\' 2"  (1.575 m), weight 65.3 kg (144 lb), SpO2 100 %.  GENERAL:  57 y.o.-year-old patient lying in the bed with no acute distress.  EYES: Pupils equal, round, reactive to light and accommodation. No scleral icterus. Extraocular muscles intact.  HEENT: Head atraumatic, normocephalic. Oropharynx and nasopharynx clear.  Dry mucous membranes NECK:  Supple, no jugular venous distention. No thyroid enlargement, no tenderness.  Poor skin turgor LUNGS: Normal breath sounds bilaterally, no wheezing, rales,rhonchi or crepitation. No use of accessory muscles of respiration.  CARDIOVASCULAR: S1, S2 normal. No murmurs, rubs, or gallops.  ABDOMEN: Soft, nontender, nondistended. Bowel sounds present. No organomegaly or mass.  EXTREMITIES: No pedal edema, cyanosis, or clubbing.  NEUROLOGIC: Cranial nerves II through XII are intact. MAES. Gait not checked.  PSYCHIATRIC: The patient is mildly lethargic, oriented x 3.  SKIN: No obvious rash, lesion, or ulcer.   LABORATORY PANEL:   CBC Recent Labs  Lab 02/21/18 1038  WBC 21.2*  HGB 11.1*  HCT 35.4  PLT 398  MCV 109.1*  MCH 34.3*  MCHC 31.4*  RDW 15.4*   ------------------------------------------------------------------------------------------------------------------  Chemistries  Recent Labs  Lab 02/21/18 1038  NA 128*  K 6.8*  CL 84*  CO2 8*  GLUCOSE 869*  BUN 28*  CREATININE 1.50*  CALCIUM 9.4   AST 17  ALT 23  ALKPHOS 119  BILITOT 2.5*   ------------------------------------------------------------------------------------------------------------------ estimated creatinine clearance is 36.3 mL/min (A) (by C-G formula based on SCr of 1.5 mg/dL (H)). ------------------------------------------------------------------------------------------------------------------ No results for input(s): TSH, T4TOTAL, T3FREE, THYROIDAB in the last 72 hours.  Invalid input(s): FREET3   Coagulation profile Recent Labs  Lab 02/21/18 1038  INR 1.01   ------------------------------------------------------------------------------------------------------------------- No results for input(s): DDIMER in the last 72 hours. -------------------------------------------------------------------------------------------------------------------  Cardiac Enzymes Recent Labs  Lab 02/21/18 1038  TROPONINI <0.03   ------------------------------------------------------------------------------------------------------------------ Invalid input(s): POCBNP  ---------------------------------------------------------------------------------------------------------------  Urinalysis    Component Value Date/Time   COLORURINE YELLOW (A) 02/03/2018 2219   APPEARANCEUR HAZY (A) 02/03/2018 2219   LABSPEC 1.015 02/03/2018 2219   PHURINE 5.0 02/03/2018 2219   GLUCOSEU >=500 (A) 02/03/2018 2219   HGBUR NEGATIVE 02/03/2018 2219   [place] NEGATIVE 02/03/2018 2219   KETONESUR 80 (A) 02/03/2018 2219   PROTEINUR 30 (A) 02/03/2018 2219   NITRITE NEGATIVE 02/03/2018 2219   LEUKOCYTESUR NEGATIVE 02/03/2018 2219     RADIOLOGY: Dg Chest Port 1 View  Result Date: 02/21/2018 CLINICAL  DATA:  Nausea and vomiting with shortness of breath EXAM: PORTABLE CHEST 1 VIEW COMPARISON:  02/12/2018 FINDINGS: Cardiac shadow is within normal limits. Mild aortic calcifications are noted. Diffuse interstitial changes are again  identified and stable. No focal infiltrate is noted. No bony abnormality is seen. IMPRESSION: Stable interstitial changes when compare with the prior exam. Electronically Signed   By: Inez Catalina M.D.   On: 02/21/2018 10:21    EKG: Orders placed or performed during the hospital encounter of 02/21/18  . EKG 12-Lead  . EKG 12-Lead  . ED EKG  . ED EKG  . EKG 12-Lead  . EKG 12-Lead    IMPRESSION AND PLAN: *Acute recurrent DKA Suspect due to noncompliance though patient claims to be compliant with medication as well as with diet, last admission patient was thought to be noncompliant as well  Admit to stepdown unit on our DKA protocol, case discussed with intensivist, IV fluids for rehydration, clear liquid diet for now, follow-up on urinalysis, rule out acute coronary syndrome with cardiac enzymes x3 sets, continue close medical monitoring  *Acute toxic metabolic encephalopathy Secondary to above Neurochecks per routine, aspiration/fall precautions, continue close medical monitoring  *Acute Hyperkalemia, hypochloremia, pseudohyponatremia Given 10 units IV insulin in the emergency room, to start on insulin drip, IV fluids for rehydration, continue DKA protocol with BMP checks every 4 hours, and treat as indicated  *Acute kidney injury Secondary to dehydration from DKA IV fluids for rehydration, avoid nephrotoxic agents, strict I&O monitoring, BMP in the morning  *Chronic alcohol/tobacco abuse/dependency We will place on alcohol withdrawal protocol, nicotine patch, cessation counseling ordered   All the records are reviewed and case discussed with ED provider. Management plans discussed with the patient, family and they are in agreement.  CODE STATUS:full Code Status History    Date Active Date Inactive Code Status Order ID Comments User Context   02/04/2018 0008 02/14/2018 1707 Full Code [id number]  Amelia Jo, MD ED   01/29/2018 0738 01/31/2018 2059 Full Code [id number]  Loletha Grayer, MD ED   04/15/2017 1552 04/17/2017 1859 Full Code [id number]  Gladstone Lighter, MD Inpatient   04/15/2017 1537 04/15/2017 1552 Full Code [id number]  Awilda Bill, NP ED   06/07/2016 1349 06/10/2016 1516 Full Code [id number]  Bettey Costa, MD ED   06/07/2016 1349 06/07/2016 1349 Full Code [id number]  Bettey Costa, MD ED       TOTAL TIME TAKING CARE OF THIS PATIENT: 45 minutes.    Avel Peace Salary M.D on 02/21/2018   Between 7am to 6pm - Pager - [phone]  After 6pm go to [email or web address] - password [email or web address] [facility]  Office  [phone]  CC: Primary care physician; Michaela Finner, NP   Note: This dictation was prepared with Dragon dictation along with smaller phrase technology. Any transcriptional errors that result from this process are unintentional.

## 2018-02-21 NOTE — Progress Notes (Signed)
Spoke with E-link and left a message regarding transitioning patient off of insulin drip.  Will continue to monitor.

## 2018-02-21 NOTE — ED Notes (Signed)
ICU RN has been called and updated on the pt's current condition, HR and medication administration. RN verbalized understanding. A call back number for me was given to RN just in case she had any more questions. Please see vitals in EMR and medications in MAR.

## 2018-02-21 NOTE — ED Triage Notes (Signed)
Pt arrives [facility] from home. Dc week ago for pneumonia. State N&V this AM at 1. Arrives c/o of SOB and abd pain yesterday. CBG HIGH with EMS. Takes lantus at home. Just started on home oxygen, arrives on 2 L [place]. EMS gave 4mg  zofran. Alert and oriented.

## 2018-02-21 NOTE — Progress Notes (Signed)
Family Meeting Note  Advance Directive:yes  Today a meeting took place with the Patient.  Patient is able to participate   The following clinical team members were present during this meeting:MD  The following were discussed:Patient's diagnosis: Recurrent DKA, Patient's progosis: Unable to determine and Goals for treatment: Full Code  Additional follow-up to be provided: prn  Time spent during discussion:20 minutes  Gorden Harms, MD

## 2018-02-21 NOTE — Progress Notes (Signed)
Notified Dr. Corinna Lines of patient's elevated troponin.  Orders placed by MD. Will continue to monitor.

## 2018-02-21 NOTE — ED Provider Notes (Signed)
[facility] Emergency Department Provider Note   ____________________________________________   First MD Initiated Contact with Patient 02/21/18 [phone]     (approximate)  I have reviewed the triage vital signs and the nursing notes.   HISTORY  Chief Complaint Emesis and Shortness of Breath  EM caveat: Patient with some slight confusion  HPI Michaela Johnson is a 57 y.o. female history of diabetes, recent DKA with encephalopathy.  Patient called EMS today for nausea vomiting fatigue.  Reports this morning she started feeling nausea, upper abdominal discomfort and vomiting.  Reports she felt similar when she was in the hospital last.  Denies fevers or chills.  Feels fatigued.  Patient then began speaking and asking questions about why the floor is being cleaned ( there is not anyone clean the floor in the room at present.)  And it becomes apparent that she is somewhat confused.  I do not believe this to be reliable history.  She denies chest pain at this time but did report some earlier over the left side, but now reports she is not having pain except in the upper abdomen where she feels nauseated.  Reports she cannot remember when she last used insulin.  Does tell me she has a nicotine patch that she started using.  Past Medical History:  Diagnosis Date  . Alcohol use   . Depression   . Diabetes mellitus without complication ([place])   . GERD (gastroesophageal reflux disease)   . Hypertension   . Neuropathy   . Tobacco use     Patient Active Problem List   Diagnosis Date Noted  . H/O medication noncompliance 02/07/2018  . PAF (paroxysmal atrial fibrillation) ([place]) 01/30/2018  . Atrial fibrillation with rapid ventricular response ([place]) 01/30/2018  . Seizure ([place])   . DKA (diabetic ketoacidoses) ([place]) 06/07/2016    Past Surgical History:  Procedure Laterality Date  . NO PAST SURGERIES      Prior to Admission medications   Medication Sig Start  Date End Date Taking? Authorizing Provider  albuterol (PROVENTIL HFA;VENTOLIN HFA) 108 (90 Base) MCG/ACT inhaler Inhale 2 puffs into the lungs every 6 (six) hours as needed for wheezing or shortness of breath. 02/14/18  Yes Pyreddy, Reatha Harps, MD  aspirin 81 MG chewable tablet Chew 1 tablet (81 mg total) by mouth daily. 01/31/18  Yes Demetrios Loll, MD  diltiazem (CARDIZEM) 90 MG tablet Take 1 tablet (90 mg total) by mouth every 12 (twelve) hours. 01/31/18  Yes Demetrios Loll, MD  Cholecalciferol (VITAMIN D3) 400 units CAPS Take 1 capsule by mouth daily.    [provider]  FLUoxetine (PROZAC) 40 MG capsule Take 40 mg by mouth daily.    [provider]  hydrochlorothiazide (MICROZIDE) 12.5 MG capsule Take 1 capsule (12.5 mg total) by mouth daily. 02/14/18 03/16/18  Saundra Shelling, MD  insulin glargine (LANTUS) 100 UNIT/ML injection Inject 50 Units into the skin daily.    [provider]  lisinopril (PRINIVIL,ZESTRIL) 40 MG tablet Take 40 mg by mouth daily.    [provider]  Multiple Vitamin (MULTI-VITAMINS) TABS Take 1 tablet by mouth daily.    [provider]  Omega-3 Fatty Acids (FISH OIL) 1000 MG CAPS Take 1 tablet by mouth daily.    [provider]  omeprazole (PRILOSEC) 20 MG capsule Take 1 capsule by mouth daily.    [provider]  predniSONE (DELTASONE) 10 MG tablet Take 1 tablet (10 mg total) by mouth daily. Label  & dispense according  to the schedule below.  6 tablets day one, then 5 table day 2, then 4 tablets day 3, then 3 tablets day 4, 2 tablets day 5, then 1 tablet day 6, then stop 02/14/18   Saundra Shelling, MD    Allergies Levaquin [levofloxacin in d5w]  Family History  Problem Relation Age of Onset  . Diabetes Mother   . CAD Father     Social History Social History   Tobacco Use  . Smoking status: Current Some Day Smoker    Packs/day: 1.00    Years: 41.00    Pack years: 41.00  . Smokeless tobacco: Never Used  Substance  Use Topics  . Alcohol use: Yes    Comment: 42 per week  . Drug use: Yes    Types: Marijuana    Review of Systems   EM caveat   ____________________________________________   PHYSICAL EXAM:  VITAL SIGNS: ED Triage Vitals  Enc Vitals Group     BP 02/21/18 0953 (!) 99/52     Pulse Rate 02/21/18 0953 (!) 106     Resp 02/21/18 0953 (!) 22     Temp 02/21/18 0953 98.1 F (36.7 C)     Temp src --      SpO2 02/21/18 0953 100 %     Weight 02/21/18 0953 144 lb (65.3 kg)     Height 02/21/18 0953 5\' 2"  (1.575 m)     Head Circumference --      Peak Flow --      Pain Score 02/21/18 0954 10     Pain Loc --      Pain Edu? --      Excl. in [place]? --     Constitutional: Alert and oriented to self and hospital, but occasionally saying things that do not quite make sense.  Eyes: Conjunctivae are normal. Head: Atraumatic. Nose: No congestion/rhinnorhea. Mouth/Throat: Mucous membranes are very dry. Neck: No stridor.   Cardiovascular: Tachycardic rate, regular rhythm.  Is a soft systolic ejection murmur heart sounds.  Good peripheral circulation. Respiratory: Normal respiratory effort except for some moderate to mild tachypnea.  No retractions. Lungs CTAB.  No distress. Gastrointestinal: Soft and nontender. No distention. Musculoskeletal: No lower extremity tenderness nor edema. Neurologic:  Normal speech and language. No gross focal neurologic deficits are appreciated.  Moves all extremities with equal strength.  No facial droop. Skin:  Skin is warm, dry and intact. No rash noted. Psychiatric: Mood and affect are normal. Speech and behavior are normal except he occasionally says things that seem slightly inappropriate given the clinical context such as seeing people cleaning when no one is cleaning.  ____________________________________________   LABS (all labs ordered are listed, but only abnormal results are displayed)  Labs Reviewed  CBC - Abnormal; Notable for the following  components:      Result Value   WBC 21.2 (*)    RBC 3.25 (*)    Hemoglobin 11.1 (*)    MCV 109.1 (*)    MCH 34.3 (*)    MCHC 31.4 (*)    RDW 15.4 (*)    All other components within normal limits  BLOOD GAS, VENOUS - Abnormal; Notable for the following components:   pH, Ven 7.16 (*)    pCO2, Ven 21 (*)    Bicarbonate 7.5 (*)    Acid-base deficit 19.4 (*)    All other components within normal limits  GLUCOSE, CAPILLARY - Abnormal; Notable for the following components:   Glucose-Capillary >600 (*)  All other components within normal limits  PROTIME-INR  COMPREHENSIVE METABOLIC PANEL  LIPASE, BLOOD  TROPONIN I  LACTIC ACID, PLASMA  LACTIC ACID, PLASMA  URINALYSIS, COMPLETE (UACMP) WITH MICROSCOPIC   ____________________________________________  EKG  EKG is reviewed by me at 10:50 AM Heart rate 115 QRS 159 QTC 560 Sinus tachycardia, notable LVH, severely elevated T waves with peaking concerning for potential subendocardial ischemia, versus hyperacute T waves, hyperkalemia etc. all considered.  Also prolongation of the QT. ____________________________________________  RADIOLOGY  Dg Chest Port 1 View  Result Date: 02/21/2018 CLINICAL DATA:  Nausea and vomiting with shortness of breath EXAM: PORTABLE CHEST 1 VIEW COMPARISON:  02/12/2018 FINDINGS: Cardiac shadow is within normal limits. Mild aortic calcifications are noted. Diffuse interstitial changes are again identified and stable. No focal infiltrate is noted. No bony abnormality is seen. IMPRESSION: Stable interstitial changes when compare with the prior exam. Electronically Signed   By: Inez Catalina M.D.   On: 02/21/2018 10:21    ____________________________________________   PROCEDURES  Procedure(s) performed: None  Procedures  Critical Care performed: Yes, see critical care note(s)  CRITICAL CARE Performed by: Delman Kitten   Total critical care time: 45 minutes  Critical care time was exclusive of separately  billable procedures and treating other patients.  Critical care was necessary to treat or prevent imminent or life-threatening deterioration.  Critical care was time spent personally by me on the following activities: development of treatment plan with patient and/or surrogate as well as nursing, discussions with consultants, evaluation of patient's response to treatment, examination of patient, obtaining history from patient or surrogate, ordering and performing treatments and interventions, ordering and review of laboratory studies, ordering and review of radiographic studies, pulse oximetry and re-evaluation of patient's condition.  ____________________________________________   INITIAL IMPRESSION / ASSESSMENT AND PLAN / ED COURSE  Pertinent labs & imaging results that were available during my care of the patient were reviewed by me and considered in my medical decision making (see chart for details).    Clinical Course as of Feb 21 1149  Sun Feb 21, 2018  1143 [place] suspected patient's potassium is returned quite high.  This point discussed with Dr. Jerelyn Charles, will give a dose of IV insulin and plan to initiate glucose stabilizer/insulin drip.  Admitting to hospitalist service.   [MQ]    Clinical Course User Index [MQ] Delman Kitten, MD   Patient presents for evaluation nausea vomiting with hyperglycemia, hyperkalemia which is notable being treated with fluids, calcium, insulin, insulin infusion.  Also diabetic ketoacidosis, some alteration in mental status.  Patient critical condition, being actively managed for above primarily DKA and hyperkalemia with some encephalopathy  ----------------------------------------- 12:20 PM on 02/21/2018 -----------------------------------------  Patient heart rate improving, blood pressure improving with fluids.  Mental status remains about the same and seems a little more alert and confusion seems to be improved.  Patient admitted to the hospitalist  service, started on insulin infusion.  Notable leukocytosis, was present during her last admission for DKA as well.  Continue to await full work-up to rule out of infectious, though afebrile and at this point suspect likely leukocytosis more reactive and associated with DKA than an obvious potential infection though that is still on the table and being worked up during ongoing care with hospitalist.  ____________________________________________   FINAL CLINICAL IMPRESSION(S) / ED DIAGNOSES  Final diagnoses:  Diabetic ketoacidosis without coma associated with other specified diabetes mellitus ([place])      NEW MEDICATIONS STARTED DURING THIS VISIT:  New Prescriptions   No medications on file     Note:  This document was prepared using Dragon voice recognition software and may include unintentional dictation errors.     Delman Kitten, MD 02/21/18 [phone]

## 2018-02-21 NOTE — ED Notes (Signed)
Critical values reported by Demetria from lab, Glucose=869, K+=6.9 and Lactic=3.3. Jacqualine Code, MD aware.

## 2018-02-21 NOTE — ED Notes (Signed)
Pt kept desatting on her normal 2 L to mid 80's. Initially increased oxygen to 3 L but kept desatting. Increased oxygen to 4L [place]. Will continue to monitor.

## 2018-02-22 ENCOUNTER — Encounter: Payer: Self-pay | Admitting: Internal Medicine

## 2018-02-22 LAB — BASIC METABOLIC PANEL
Anion gap: 6 (ref 5–15)
Anion gap: 8 (ref 5–15)
BUN: 20 mg/dL (ref 6–20)
BUN: 19 mg/dL (ref 6–20)
CALCIUM: 7.9 mg/dL — AB (ref 8.9–10.3)
CHLORIDE: 101 mmol/L (ref 98–111)
CHLORIDE: 100 mmol/L (ref 98–111)
CO2: 27 mmol/L (ref 22–32)
CO2: 27 mmol/L (ref 22–32)
Calcium: 7.9 mg/dL — ABNORMAL LOW (ref 8.9–10.3)
Creatinine, Ser: 0.79 mg/dL (ref 0.44–1.00)
Creatinine, Ser: 0.63 mg/dL (ref 0.44–1.00)
GFR calc Af Amer: >60 mL/min (ref >60)
GFR calc non Af Amer: >60 mL/min (ref >60)
GFR calc non Af Amer: >60 mL/min (ref >60)
GLUCOSE: 178 mg/dL — AB (ref 70–99)
Glucose, Bld: 121 mg/dL — ABNORMAL HIGH (ref 70–99)
POTASSIUM: 4 mmol/L (ref 3.5–5.1)
Potassium: 4.2 mmol/L (ref 3.5–5.1)
SODIUM: 134 mmol/L — AB (ref 135–145)
SODIUM: 135 mmol/L (ref 135–145)

## 2018-02-22 LAB — GLUCOSE, CAPILLARY
GLUCOSE-CAPILLARY: 135 mg/dL — AB (ref 70–99)
Glucose-Capillary: 107 mg/dL — ABNORMAL HIGH (ref 70–99)
Glucose-Capillary: 115 mg/dL — ABNORMAL HIGH (ref 70–99)
Glucose-Capillary: 133 mg/dL — ABNORMAL HIGH (ref 70–99)
Glucose-Capillary: 152 mg/dL — ABNORMAL HIGH (ref 70–99)
Glucose-Capillary: 153 mg/dL — ABNORMAL HIGH (ref 70–99)
Glucose-Capillary: 175 mg/dL — ABNORMAL HIGH (ref 70–99)
Glucose-Capillary: 192 mg/dL — ABNORMAL HIGH (ref 70–99)

## 2018-02-22 LAB — TROPONIN I
TROPONIN I: 0.33 ng/mL — AB (ref <0.03)
Troponin I: 0.25 ng/mL (ref <0.03)

## 2018-02-22 LAB — COMPREHENSIVE METABOLIC PANEL
ALBUMIN: 3.2 g/dL — AB (ref 3.5–5.0)
ALT: 23 U/L (ref 0–44)
AST: 17 U/L (ref 15–41)
Alkaline Phosphatase: 119 U/L (ref 38–126)
Anion gap: 36 — ABNORMAL HIGH (ref 5–15)
BILIRUBIN TOTAL: 2.5 mg/dL — AB (ref 0.3–1.2)
BUN: 28 mg/dL — AB (ref 6–20)
CHLORIDE: 84 mmol/L — AB (ref 98–111)
CO2: 8 mmol/L — ABNORMAL LOW (ref 22–32)
CREATININE: 1.5 mg/dL — AB (ref 0.44–1.00)
Calcium: 9.4 mg/dL (ref 8.9–10.3)
GFR calc Af Amer: 43 mL/min — ABNORMAL LOW (ref >60)
GFR, EST NON AFRICAN AMERICAN: 37 mL/min — AB (ref >60)
GLUCOSE: 869 mg/dL — AB (ref 70–99)
Potassium: 6.8 mmol/L (ref 3.5–5.1)
Sodium: 128 mmol/L — ABNORMAL LOW (ref 135–145)
Total Protein: 6.5 g/dL (ref 6.5–8.1)

## 2018-02-22 MED ORDER — APIXABAN 5 MG PO TABS
5.0000 mg | ORAL_TABLET | Freq: Two times a day (BID) | ORAL | Status: DC
  Administered 2018-02-22: 5 mg via ORAL
  Filled 2018-02-22: qty 1

## 2018-02-22 MED ORDER — ADULT MULTIVITAMIN W/MINERALS CH
1.0000 | ORAL_TABLET | Freq: Every day | ORAL | Status: DC
  Administered 2018-02-22: 1 via ORAL
  Filled 2018-02-22: qty 1

## 2018-02-22 MED ORDER — APIXABAN 5 MG PO TABS: 5.0000 mg | ORAL_TABLET | Freq: Two times a day (BID) | ORAL | 0 refills | Status: DC

## 2018-02-22 MED ORDER — MORPHINE SULFATE (PF) 2 MG/ML IV SOLN
1.0000 mg | INTRAVENOUS | Status: DC | PRN
  Filled 2018-02-22: qty 1

## 2018-02-22 MED ORDER — FOLIC ACID 1 MG PO TABS
1.0000 mg | ORAL_TABLET | Freq: Every day | ORAL | Status: DC
  Administered 2018-02-22: 1 mg via ORAL
  Filled 2018-02-22: qty 1

## 2018-02-22 MED ORDER — ACETAMINOPHEN 325 MG PO TABS
650.0000 mg | ORAL_TABLET | Freq: Four times a day (QID) | ORAL | Status: DC | PRN
  Administered 2018-02-22: 650 mg via ORAL
  Filled 2018-02-22: qty 2

## 2018-02-22 MED ORDER — MORPHINE SULFATE (PF) 2 MG/ML IV SOLN
1.0000 mg | Freq: Once | INTRAVENOUS | Status: AC
  Administered 2018-02-22: 1 mg via INTRAVENOUS

## 2018-02-22 MED ORDER — LORAZEPAM 1 MG PO TABS: 1.0000 mg | ORAL_TABLET | Freq: Four times a day (QID) | ORAL | Status: DC | PRN

## 2018-02-22 MED ORDER — MORPHINE SULFATE (PF) 2 MG/ML IV SOLN
INTRAVENOUS | Status: AC
  Administered 2018-02-22: 1 mg via INTRAVENOUS
  Filled 2018-02-22: qty 1

## 2018-02-22 MED ORDER — INSULIN ASPART 100 UNIT/ML ~~LOC~~ SOLN
0.0000 [IU] | SUBCUTANEOUS | Status: DC
  Administered 2018-02-22 (×2): 2 [IU] via SUBCUTANEOUS
  Administered 2018-02-22: 1 [IU] via SUBCUTANEOUS
  Filled 2018-02-22 (×3): qty 1

## 2018-02-22 MED ORDER — LORAZEPAM 2 MG/ML IJ SOLN: 1.0000 mg | Freq: Four times a day (QID) | INTRAMUSCULAR | Status: DC | PRN

## 2018-02-22 MED ORDER — ENOXAPARIN SODIUM 40 MG/0.4ML ~~LOC~~ SOLN: 40.0000 mg | SUBCUTANEOUS | Status: DC

## 2018-02-22 MED ORDER — ASPIRIN EC 81 MG PO TBEC
162.0000 mg | DELAYED_RELEASE_TABLET | Freq: Every day | ORAL | Status: DC
  Administered 2018-02-22: 162 mg via ORAL
  Filled 2018-02-22: qty 2

## 2018-02-22 MED ORDER — INSULIN GLARGINE 100 UNIT/ML ~~LOC~~ SOLN
20.0000 [IU] | Freq: Two times a day (BID) | SUBCUTANEOUS | Status: DC
  Administered 2018-02-22 (×2): 20 [IU] via SUBCUTANEOUS
  Filled 2018-02-22 (×4): qty 0.2

## 2018-02-22 MED ORDER — VITAMIN B-1 100 MG PO TABS
100.0000 mg | ORAL_TABLET | Freq: Every day | ORAL | Status: DC
  Administered 2018-02-22: 100 mg via ORAL
  Filled 2018-02-22: qty 1

## 2018-02-22 NOTE — Progress Notes (Signed)
Pt A&O. Complains of leg pain "8" on 1-10 pain pain scale. Will give Morphine Per Dr. Mortimer Fries. See MAR.

## 2018-02-22 NOTE — Progress Notes (Signed)
Pt discharged. A&O on 2 L [place], SPO2 96%, NSR, HR 81 per monitor. Pt's was discharged home with husband. No home oxygen was provided from home. Husband stated "He forgot." Pt and family was advised against leaving hospital without home oxygen and assured that it was okay to return home to get it.Pt and family stated they understood the risk and continued with the decision to ride home without oxygen. Pt stated "I understand, I will be okay till I get home and then I will put my oxygen on. Discharge instructions/papers covered.

## 2018-02-22 NOTE — Progress Notes (Signed)
[facility] Progress Note Patient Name: Michaela Johnson DOB: 09-15-1960 MRN: [id number]   Date of Service  02/22/2018  HPI/Events of Note  Patient c/o pain. AST and ALT are both normal.   eICU Interventions  Will order: 1. Tylenol 650 mg PO Q 6 hours PRN pain or headache.      Intervention Category Intermediate Interventions: Pain - evaluation and management  Sommer,Steven Eugene 02/22/2018, 5:23 AM

## 2018-02-22 NOTE — Progress Notes (Signed)
Decreased to 2l after neb treatment  

## 2018-02-22 NOTE — Progress Notes (Signed)
Inpatient Diabetes Program Recommendations  AACE/ADA: New Consensus Statement on Inpatient Glycemic Control (2019)  Target Ranges:  Prepandial:   less than 140 mg/dL      Peak postprandial:   less than 180 mg/dL (1-2 hours)      Critically ill patients:  140 - 180 mg/dL  Results for Michaela Johnson, Michaela Johnson (MRN [id number]) as of 02/22/2018 08:20  Ref. Range 02/22/2018 00:52 02/22/2018 01:52 02/22/2018 02:58 02/22/2018 04:07 02/22/2018 05:12  Glucose-Capillary Latest Ref Range: 70 - 99 mg/dL 153 (H) 152 (H) 133 (H) 115 (H) 107 (H)  Results for Michaela Johnson, Michaela Johnson (MRN [id number]) as of 02/22/2018 08:20  Ref. Range 02/21/2018 10:38  Glucose Latest Ref Range: 70 - 99 mg/dL [facility])   Results for Michaela Johnson, Michaela Johnson (MRN [id number]) as of 02/22/2018 08:20  Ref. Range 01/29/2018 06:17  Hemoglobin A1C Latest Ref Range: 4.8 - 5.6 % 7.3 (H)   Review of Glycemic Control  Outpatient Diabetes medications: Lantus 50 units daily, Novolog TID with meals (no dose noted) Current orders for Inpatient glycemic control: Lantus 20 units BID, Novolog 0-9 units Q4H  Inpatient Diabetes Program Recommendations:  Insulin - Basal: Noted patient received Levemir 20 units at 4:03 am today at time of transition from IV to SQ insulin.  Insulin - Meal Coverage: Once diet is advanced, please consider ordering Novolog 3 units TID with meals for meal coverage if patient eats at least 50% of meals.  NOTE: Patient is well known to Inpatient Diabetes team; 3rd admission in last 6 months. Patient admitted again with DKA. Inpatient Diabetes Coordinator spoke with patient on 01/29/18 during prior hospital admission. Patient reported that she is followed by [facility] and gets all needed insulin and testing supplies at the clinic pharmacy and that she was taking DM medications as prescribed. Patient reported that she was recently told at the clinic that she could stop taking Novolog with meals. Anticipate patient will require basal insulin as well as Novolog  meal coverage and correction scale for DM control. At time of discharge, please consider prescribing specific dose of Novolog insulin meal coverage and correction (in addition to basal insulin). Will continue to follow while inpatient.  Thanks, Barnie Alderman, RN, MSN, CDE Diabetes Coordinator Inpatient Diabetes Program [phone] (Team Pager from 8am to 5pm)

## 2018-02-22 NOTE — Care Management (Addendum)
Patient readmitted. [facility]. [place] requested from PA and [facility].  Chronic O2 at home after last admission.  RNCM will follow. Message sent to [facility] and Medication Management to see if they can help.  PCP is apparently with [facility]; now presents in DKA. Dr. Doy Hutching has approved HRI. Patient will start out on Eliquis and transition to Coumadin as long term plan per ICU progression.  Update at 1230P: [place] placed on hold for now. Patient to continue home health services. Discussed with CSW regarding readmission and ETOH.

## 2018-02-22 NOTE — Clinical Social Work Note (Signed)
Clinical Social Work Assessment  Patient Details  Name: Michaela Johnson MRN: [id number] Date of Birth: 1960-09-24  Date of referral:  02/22/18               Reason for consult:  Substance Use/ETOH Abuse                Permission sought to share information with:    Permission granted to share information::     Name::        Agency::     Relationship::     Contact Information:     Housing/Transportation Living arrangements for the past 2 months:  Single Family Home Source of Information:  Patient Patient Interpreter Needed:  None Criminal Activity/Legal Involvement Pertinent to Current Situation/Hospitalization:  No - Comment as needed Significant Relationships:  Spouse Lives with:  Spouse Do you feel safe going back to the place where you live?  Yes Need for family participation in patient care:  Yes (Comment)  Care giving concerns:  Patient lives in [place] with her husband Michaela Johnson.    Social Worker assessment / plan:  Holiday representative ([place]) received verbal consult from RN case manager that patient has a history of alcohol use and frequent admissions. CSW met with patient alone at bedside to address consult. Patient was alert and oriented X4 and was laying in the bed. CSW introduced self and explained role of CSW department. Per patient she lives in [place] with her husband Michaela Johnson. Patient reported that Michaela Johnson is retired and they are living off his social security benefits. Per patient she does not have health insurance. Patient reported that her husband provides transportation. Per patient she has not had a drink in 3 weeks and occasionally uses marijuana. CSW asked what specific things patient is doing to not drink and she said nothing. CSW provided patient with a list of [facility] outpatient substance abuse resources including [facility]. CSW explained the benefit of having professional support during recovery. Patient accepted resources. Patient reported that she has been in  the hospital a lot lately because she had pneumonia and a kidney infection. CSW provided emotional support. Patient reported no needs or concerns. CSW will continue to follow and assist as needed.     Employment status:  Unemployed Forensic scientist:  Self Pay (Medicaid Pending) PT Recommendations:  Not assessed at this time Information / Referral to community resources:  Outpatient Substance Abuse Treatment Options  Patient/Family's Response to care:  Patient accepted substance abuse resources.   Patient/Family's Understanding of and Emotional Response to Diagnosis, Current Treatment, and Prognosis:  Patient was very pleasant and thanked CSW for assistance.   Emotional Assessment Appearance:  Appears stated age Attitude/Demeanor/Rapport:    Affect (typically observed):  Accepting, Adaptable, Pleasant Orientation:  Oriented to Self, Oriented to Place, Oriented to  Time, Oriented to Situation Alcohol / Substance use:  Alcohol Use Psych involvement (Current and /or in the community):  No (Comment)  Discharge Needs  Concerns to be addressed:  Discharge Planning Concerns Readmission within the last 30 days:  No Current discharge risk:  Substance Abuse Barriers to Discharge:  Continued Medical Work up   [employer], Veronia Beets, LCSW 02/22/2018, 4:27 PM

## 2018-02-22 NOTE — Discharge Summary (Signed)
[place] at [place] NAME: Michaela Johnson    MR#:  [id number]  DATE OF BIRTH:  January 14, 1961  DATE OF ADMISSION:  02/21/2018 ADMITTING PHYSICIAN: Gorden Harms, MD  DATE OF DISCHARGE: 02/22/2018  PRIMARY CARE PHYSICIAN: [name], NP    ADMISSION DIAGNOSIS:  Hyperkalemia [E87.5] Diabetic ketoacidosis without coma associated with other specified diabetes mellitus ([place]) [E13.10]  DISCHARGE DIAGNOSIS:  Active Problems:   DKA (diabetic ketoacidoses) ([place])   SECONDARY DIAGNOSIS:   Past Medical History:  Diagnosis Date  . Alcohol use   . Depression   . Diabetes mellitus without complication ([place])   . GERD (gastroesophageal reflux disease)   . Hypertension   . Neuropathy   . PAF (paroxysmal atrial fibrillation) ([place])   . Tobacco use     HOSPITAL COURSE:   57 year old female with history of tobacco and EtOH abuse, PAF and diabetes who presents to the emergency room with shortness of breath and nausea and found to have DKA.  1.  DKA: Patient was placed on DKA protocol.  Her DKA has resolved.  She is tolerating her diet.  She will continue outpatient medications and will need close follow-up by her PCP.  2.  PAF: Patient was initially on amiodarone drip.  She is transition back to diltiazem.  She has been started on Eliquis.  Side effects, risk, alternatives were discussed with the patient.  3.  Acute encephalopathy, metabolic due to DKA which has resolved  4.  Acute kidney injury in the setting of dehydration and DKA  5.  Elevated troponin due to demand ischemia  Patient was ruled out for ACS  6.  EtOH abuse: Patient had uneventful detox on CIWA protocol  7.Tobacco dependence: Patient is encouraged to quit smoking. Counseling was provided for 4 minutes.    DISCHARGE CONDITIONS AND DIET:   stasble Cardiac diabetic diet  CONSULTS OBTAINED:    DRUG ALLERGIES:   Allergies  Allergen Reactions  . Levaquin [Levofloxacin  In D5w] Itching    DISCHARGE MEDICATIONS:   Allergies as of 02/22/2018      Reactions   Levaquin [levofloxacin In D5w] Itching      Medication List    STOP taking these medications   aspirin 81 MG chewable tablet     TAKE these medications   albuterol 108 (90 Base) MCG/ACT inhaler Commonly known as:  PROVENTIL HFA;VENTOLIN HFA Inhale 2 puffs into the lungs every 6 (six) hours as needed for wheezing or shortness of breath.   apixaban 5 MG Tabs tablet Commonly known as:  ELIQUIS Take 1 tablet (5 mg total) by mouth 2 (two) times daily.   diltiazem 90 MG tablet Commonly known as:  CARDIZEM Take 1 tablet (90 mg total) by mouth every 12 (twelve) hours.   Fish Oil 1000 MG Caps Take 1 tablet by mouth daily.   FLUoxetine 40 MG capsule Commonly known as:  PROZAC Take 40 mg by mouth daily.   hydrochlorothiazide 12.5 MG capsule Commonly known as:  MICROZIDE Take 1 capsule (12.5 mg total) by mouth daily.   insulin aspart 100 UNIT/ML injection Commonly known as:  novoLOG Inject into the skin 3 (three) times daily before meals.   insulin glargine 100 UNIT/ML injection Commonly known as:  LANTUS Inject 50 Units into the skin daily.   lisinopril 40 MG tablet Commonly known as:  PRINIVIL,ZESTRIL Take 40 mg by mouth daily.   MULTI-VITAMINS Tabs Take 1 tablet by mouth daily.   omeprazole  20 MG capsule Commonly known as:  PRILOSEC Take 1 capsule by mouth daily.   Vitamin D3 400 units Caps Take 1 capsule by mouth daily.         Today   CHIEF COMPLAINT:  Patient in NSR No issues overnight no chest pain   VITAL SIGNS:  Blood pressure 132/60, pulse 92, temperature 99.1 F (37.3 C), temperature source Oral, resp. rate (!) 31, height 5\' 2"  (1.575 m), weight 140 lb 14 oz (63.9 kg), SpO2 90 %.   REVIEW OF SYSTEMS:  Review of Systems  Constitutional: Negative.  Negative for chills, fever and malaise/fatigue.  HENT: Negative.  Negative for ear discharge, ear pain,  hearing loss, nosebleeds and sore throat.   Eyes: Negative.  Negative for blurred vision and pain.  Respiratory: Negative.  Negative for cough, hemoptysis, shortness of breath and wheezing.   Cardiovascular: Negative.  Negative for chest pain, palpitations and leg swelling.  Gastrointestinal: Negative.  Negative for abdominal pain, blood in stool, diarrhea, nausea and vomiting.  Genitourinary: Negative.  Negative for dysuria.  Musculoskeletal: Negative.  Negative for back pain.  Skin: Negative.   Neurological: Negative for dizziness, tremors, speech change, focal weakness, seizures and headaches.  Endo/Heme/Allergies: Negative.  Does not bruise/bleed easily.  Psychiatric/Behavioral: Negative.  Negative for depression, hallucinations and suicidal ideas.     PHYSICAL EXAMINATION:  GENERAL:  57 y.o.-year-old patient lying in the bed with no acute distress.  NECK:  Supple, no jugular venous distention. No thyroid enlargement, no tenderness.  LUNGS: Normal breath sounds bilaterally, no wheezing, rales,rhonchi  No use of accessory muscles of respiration.  CARDIOVASCULAR: S1, S2 normal. No murmurs, rubs, or gallops.  ABDOMEN: Soft, non-tender, non-distended. Bowel sounds present. No organomegaly or mass.  EXTREMITIES: No pedal edema, cyanosis, or clubbing.  PSYCHIATRIC: The patient is alert and oriented x 3.  SKIN: No obvious rash, lesion, or ulcer.   DATA REVIEW:   CBC Recent Labs  Lab 02/21/18 1642  WBC 30.4*  HGB 10.6*  HCT 33.8*  PLT 397    Chemistries  Recent Labs  Lab 02/21/18 1021 02/21/18 1038  02/22/18 0523  NA  --  128*   < > 135  K  --  6.8*   < > 4.2  CL  --  84*   < > 100  CO2  --  8*   < > 27  GLUCOSE  --  869*   < > 121*  BUN  --  28*   < > 19  CREATININE  --  1.50*   < > 0.63  CALCIUM  --  9.4   < > 7.9*  MG 2.8*  --   --   --   AST  --  17  --   --   ALT  --  23  --   --   ALKPHOS  --  119  --   --   BILITOT  --  2.5*  --   --    < > = values in this  interval not displayed.    Cardiac Enzymes Recent Labs  Lab 02/21/18 1642 02/21/18 2058 02/22/18 0154  TROPONINI 0.03* 0.22* 0.33*    Microbiology Results  @[id number] @  RADIOLOGY:  Dg Chest Port 1 View  Result Date: 02/21/2018 CLINICAL DATA:  Nausea and vomiting with shortness of breath EXAM: PORTABLE CHEST 1 VIEW COMPARISON:  02/12/2018 FINDINGS: Cardiac shadow is within normal limits. Mild aortic calcifications are noted. Diffuse interstitial changes are again identified  and stable. No focal infiltrate is noted. No bony abnormality is seen. IMPRESSION: Stable interstitial changes when compare with the prior exam. Electronically Signed   By: Inez Catalina M.D.   On: 02/21/2018 10:21      Allergies as of 02/22/2018      Reactions   Levaquin [levofloxacin In D5w] Itching      Medication List    STOP taking these medications   aspirin 81 MG chewable tablet     TAKE these medications   albuterol 108 (90 Base) MCG/ACT inhaler Commonly known as:  PROVENTIL HFA;VENTOLIN HFA Inhale 2 puffs into the lungs every 6 (six) hours as needed for wheezing or shortness of breath.   apixaban 5 MG Tabs tablet Commonly known as:  ELIQUIS Take 1 tablet (5 mg total) by mouth 2 (two) times daily.   diltiazem 90 MG tablet Commonly known as:  CARDIZEM Take 1 tablet (90 mg total) by mouth every 12 (twelve) hours.   Fish Oil 1000 MG Caps Take 1 tablet by mouth daily.   FLUoxetine 40 MG capsule Commonly known as:  PROZAC Take 40 mg by mouth daily.   hydrochlorothiazide 12.5 MG capsule Commonly known as:  MICROZIDE Take 1 capsule (12.5 mg total) by mouth daily.   insulin aspart 100 UNIT/ML injection Commonly known as:  novoLOG Inject into the skin 3 (three) times daily before meals.   insulin glargine 100 UNIT/ML injection Commonly known as:  LANTUS Inject 50 Units into the skin daily.   lisinopril 40 MG tablet Commonly known as:  PRINIVIL,ZESTRIL Take 40 mg by mouth daily.    MULTI-VITAMINS Tabs Take 1 tablet by mouth daily.   omeprazole 20 MG capsule Commonly known as:  PRILOSEC Take 1 capsule by mouth daily.   Vitamin D3 400 units Caps Take 1 capsule by mouth daily.          Management plans discussed with the patient and she is in agreement. Stable for discharge   Patient should follow up with pcp  CODE STATUS:     Code Status Orders  (From admission, onward)        Start     Ordered   02/21/18 1510  Full code  Continuous     02/21/18 1511    Code Status History    Date Active Date Inactive Code Status Order ID Comments User Context   02/04/2018 0008 02/14/2018 1707 Full Code [id number]  Amelia Jo, MD ED   01/29/2018 0738 01/31/2018 2059 Full Code [id number]  Loletha Grayer, MD ED   04/15/2017 1552 04/17/2017 1859 Full Code [id number]  Gladstone Lighter, MD Inpatient   04/15/2017 1537 04/15/2017 1552 Full Code [id number]  Awilda Bill, NP ED   06/07/2016 1349 06/10/2016 1516 Full Code [id number]  Bettey Costa, MD ED   06/07/2016 1349 06/07/2016 1349 Full Code [id number]  Bettey Costa, MD ED      TOTAL TIME TAKING CARE OF THIS PATIENT: 38 minutes.    Note: This dictation was prepared with Dragon dictation along with smaller phrase technology. Any transcriptional errors that result from this process are unintentional.  Kailiana Granquist M.D on 02/22/2018 at 12:12 PM  Between 7am to 6pm - Pager - [phone] After 6pm go to [email or web address] - password [email or web address] [facility]  Office  [phone]  CC: Primary care physician; [name], NP

## 2018-02-22 NOTE — Progress Notes (Signed)
[place] at [place] NAME: Tangelia Sanson    MR#:  [id number]  DATE OF BIRTH:  1960-12-11  SUBJECTIVE:   No CP  REVIEW OF SYSTEMS:    Review of Systems  Constitutional: Negative for fever, chills weight loss HENT: Negative for ear pain, nosebleeds, congestion, facial swelling, rhinorrhea, neck pain, neck stiffness and ear discharge.   Respiratory: Negative for cough, shortness of breath, wheezing  Cardiovascular: Negative for chest pain, palpitations and leg swelling.  Gastrointestinal: Negative for heartburn, abdominal pain, vomiting, diarrhea or consitpation Genitourinary: Negative for dysuria, urgency, frequency, hematuria Musculoskeletal: Negative for back pain or joint pain Neurological: Negative for dizziness, seizures, syncope, focal weakness,  numbness and headaches.  Hematological: Does not bruise/bleed easily.  Psychiatric/Behavioral: Negative for hallucinations, confusion, dysphoric mood    Tolerating Diet: yes      DRUG ALLERGIES:   Allergies  Allergen Reactions  . Levaquin [Levofloxacin In D5w] Itching    VITALS:  Blood pressure 132/60, pulse 92, temperature 99.1 F (37.3 C), temperature source Oral, resp. rate (!) 31, height 5\' 2"  (1.575 m), weight 140 lb 14 oz (63.9 kg), SpO2 90 %.  PHYSICAL EXAMINATION:  Constitutional: Appears well-developed and well-nourished. No distress. HENT: Normocephalic. Marland Kitchen Oropharynx is clear and moist.  Eyes: Conjunctivae and EOM are normal. PERRLA, no scleral icterus.  Neck: Normal ROM. Neck supple. No JVD. No tracheal deviation. CVS: RRR, S1/S2 +, no murmurs, no gallops, no carotid bruit.  Pulmonary: Effort and breath sounds normal, no stridor, rhonchi, wheezes, rales.  Abdominal: Soft. BS +,  no distension, tenderness, rebound or guarding.  Musculoskeletal: Normal range of motion. No edema and no tenderness.  Neuro: Alert. CN 2-12 grossly intact. No focal deficits. Skin: Skin is  warm and dry. No rash noted. Psychiatric: Normal mood and affect.      LABORATORY PANEL:   CBC Recent Labs  Lab 02/21/18 1642  WBC 30.4*  HGB 10.6*  HCT 33.8*  PLT 397   ------------------------------------------------------------------------------------------------------------------  Chemistries  Recent Labs  Lab 02/21/18 1021 02/21/18 1038  02/22/18 0523  NA  --  128*   < > 135  K  --  6.8*   < > 4.2  CL  --  84*   < > 100  CO2  --  8*   < > 27  GLUCOSE  --  869*   < > 121*  BUN  --  28*   < > 19  CREATININE  --  1.50*   < > 0.63  CALCIUM  --  9.4   < > 7.9*  MG 2.8*  --   --   --   AST  --  17  --   --   ALT  --  23  --   --   ALKPHOS  --  119  --   --   BILITOT  --  2.5*  --   --    < > = values in this interval not displayed.   ------------------------------------------------------------------------------------------------------------------  Cardiac Enzymes Recent Labs  Lab 02/21/18 1642 02/21/18 2058 02/22/18 0154  TROPONINI 0.03* 0.22* 0.33*   ------------------------------------------------------------------------------------------------------------------  RADIOLOGY:  Dg Chest Port 1 View  Result Date: 02/21/2018 CLINICAL DATA:  Nausea and vomiting with shortness of breath EXAM: PORTABLE CHEST 1 VIEW COMPARISON:  02/12/2018 FINDINGS: Cardiac shadow is within normal limits. Mild aortic calcifications are noted. Diffuse interstitial changes are again identified and stable. No focal infiltrate is noted. No bony abnormality  is seen. IMPRESSION: Stable interstitial changes when compare with the prior exam. Electronically Signed   By: Inez Catalina M.D.   On: 02/21/2018 10:21     ASSESSMENT AND PLAN:    57 year old female with history of tobacco and EtOH abuse, PAF and diabetes who presents to the emergency room with shortness of breath and nausea and found to have DKA.  1.  DKA: Patient was placed on DKA protocol.  Her DKA has resolved.  She is  tolerating her diet.  She will continue outpatient medications and will need close follow-up by her PCP.  2.  PAF: Patient was initially on amiodarone drip.  She is transition back to diltiazem.  She has been started on Eliquis.  Side effects, risk, alternatives were discussed with the patient.  3.  Acute encephalopathy, metabolic due to DKA which has resolved  4.  Acute kidney injury in the setting of dehydration and DKA  5.  Elevated troponin due to demand ischemia  Patient was ruled out for ACS  6.  EtOH abuse: Patient had uneventful detox on CIWA protocol  7.Tobacco dependence: Patient is encouraged to quit smoking. Counseling was provided for 4 minutes        Management plans discussed with the patient and she is in agreement.  CODE STATUS: full  TOTAL TIME TAKING CARE OF THIS PATIENT: 30 minutes.     POSSIBLE D/C today, DEPENDING ON CLINICAL CONDITION.   Elfa Wooton M.D on 02/22/2018 at 12:16 PM  Between 7am to 6pm - Pager - [phone] After 6pm go to [email or web address] - password [email or web address] [facility]  Office  [phone]  CC: Primary care physician; [name], NP  Note: This dictation was prepared with Dragon dictation along with smaller phrase technology. Any transcriptional errors that result from this process are unintentional.

## 2018-02-22 NOTE — Progress Notes (Signed)
[place] Progress Note Patient Name: Michaela Johnson DOB: 07-05-1961 MRN: [id number]   Date of Service  02/22/2018  HPI/Events of Note  HCO3- = 27 and Anion Gap = 6. Currently on an Insulin IV infusion.   eICU Interventions  Will transition to Lantux 20 units [place] now and Q 12 hours + Q 4 hour sensitive Novolog SSI.     Intervention Category Major Interventions: Hyperglycemia - active titration of insulin therapy  Tyrina Hines [place] 02/22/2018, 2:44 AM

## 2018-02-23 NOTE — Care Management (Signed)
Post discharge note entry 02/23/18- RNCM realized patient had discharged before Eliquis arrangements had been made with [facility].Patient does not have health or medication insurance. RNCM reached out to patient. ICU RN did give her the ELiquis Coupon however patient forgot to use and her husband pain over 600$ for the medication.  RNCM suggested that she try to get pharmacy to rerun with voucher. RNCM advised patient to follow up with [facility] to change to a more affordable medication however blood labs may be needed- she agreed. Corene Cornea with [facility] updated that patient discharged 02/23/18 with resumption of home health.

## 2018-02-24 LAB — URINE CULTURE: Culture: >=100000 — AB

## 2018-03-18 ENCOUNTER — Encounter: Payer: Self-pay | Admitting: Cardiovascular Disease

## 2018-03-18 ENCOUNTER — Ambulatory Visit (INDEPENDENT_AMBULATORY_CARE_PROVIDER_SITE_OTHER): Payer: Self-pay | Admitting: Cardiovascular Disease

## 2018-03-18 VITALS — BP 123/79 | HR 82 | Ht 62.0 in | Wt 139.5 lb

## 2018-03-18 DIAGNOSIS — IMO0001 Reserved for inherently not codable concepts without codable children: Secondary | ICD-10-CM

## 2018-03-18 DIAGNOSIS — Z794 Long term (current) use of insulin: Secondary | ICD-10-CM

## 2018-03-18 DIAGNOSIS — E119 Type 2 diabetes mellitus without complications: Secondary | ICD-10-CM | POA: Insufficient documentation

## 2018-03-18 DIAGNOSIS — I480 Paroxysmal atrial fibrillation: Secondary | ICD-10-CM

## 2018-03-18 DIAGNOSIS — E1310 Other specified diabetes mellitus with ketoacidosis without coma: Secondary | ICD-10-CM

## 2018-03-18 DIAGNOSIS — F1910 Other psychoactive substance abuse, uncomplicated: Secondary | ICD-10-CM | POA: Insufficient documentation

## 2018-03-18 MED ORDER — DILTIAZEM HCL ER COATED BEADS 180 MG PO CP24: 180.0000 mg | ORAL_CAPSULE | Freq: Every day | ORAL | 3 refills | Status: DC

## 2018-03-18 MED ORDER — APIXABAN 5 MG PO TABS: 5.0000 mg | ORAL_TABLET | Freq: Two times a day (BID) | ORAL | 11 refills | Status: DC

## 2018-03-18 MED ORDER — DILTIAZEM HCL 90 MG PO TABS: 90.0000 mg | ORAL_TABLET | Freq: Two times a day (BID) | ORAL | 11 refills | Status: DC

## 2018-03-18 NOTE — Progress Notes (Signed)
Patients oxygen saturation with 2 liters nasal cannula sitting was 99% with HR of 80.  Oxygen saturation on room air was 98% with HR 81. Patient ambulated in hallway with no oxygen. Her oxygen saturation on room air was 94% and heart rate was 88. She prefers to not use the oxygen and would like to discontinue. Communicated her wishes to Dr. Rockey Situ.

## 2018-03-18 NOTE — Progress Notes (Signed)
Cardiology Office Note  Date:  03/18/2018   ID:  Michaela Johnson, DOB May 15, 1961, MRN [id number]  PCP:  [name], NP   Chief Complaint  Patient presents with  . OTHER    PAF c/o syncope and Eliquis Cost. Meds reviewed verbally with pt.    HPI:  Michaela Johnson is a 57 year old woman with prior history of paroxysmal atrial fibrillation, August 2019 insulin-dependent diabetes,  prior history of DKA,  sunstance abuse including cocaine, alcohol abuse abuse neuropathy,  Admission to Admission to [facility] 01/2018 with DKA, nausea vomiting potassium 7.1 sodium level 123 Who presents to establish care in the [facility] presents to tablish care in the [place] office for her atrial fibrillationFor her atrial fibrillation   7/12 to 7./14 emesis, hyperkalemia,DKA, atrial fibrillation 7/17 to 7/28  Weakness emesis, hyperkalemia,DKA 8/4 to 8/5, PAF, emisis, hyperkalemia,DKA, treated with amiodaronetreated with amiodarone, eliquis   In follow-up today she reports having diarrhea one week ago, She thinks it was from drinking a Glucerna Felt dizzy while having the diarrhea in the bathroom, developed syncope Did not go to the hospital Hit the side of her face on the left  Reports that she paid $400 for eliquis For some reason did not use the coupon for free 30 days Running out of her diltiazem 90 twice daily  EKG personally reviewed by myself on todays visit Shows normal sinus rhythm rate 82 bpm left bundle branch block  Recent lab work hemoglobin A1c 7.5  Ambulated around the office without oxygen saturations down to 94% At rest 97 up to 98% oxygen   PMH:   has a past medical history of Alcohol use, Arrhythmia, Depression, Diabetes mellitus without complication ([place]), GERD (gastroesophageal reflux disease), Hypertension, Neuropathy, PAF (paroxysmal atrial fibrillation) ([place]), and Tobacco use.  PSH:    Past Surgical History:  Procedure Laterality Date  . NO PAST SURGERIES       Current Outpatient Medications  Medication Sig Dispense Refill  . albuterol (PROVENTIL HFA;VENTOLIN HFA) 108 (90 Base) MCG/ACT inhaler Inhale 2 puffs into the lungs every 6 (six) hours as needed for wheezing or shortness of breath. 1 Inhaler 2  . apixaban (ELIQUIS) 5 MG TABS tablet Take 1 tablet (5 mg total) by mouth 2 (two) times daily. 60 tablet 11  . Cholecalciferol (VITAMIN D3) 400 units CAPS Take 1 capsule by mouth daily.    . Cyanocobalamin (B-12 PO) Take by mouth daily.    Marland Kitchen FLUoxetine (PROZAC) 40 MG capsule Take 40 mg by mouth daily.    . hydrochlorothiazide (MICROZIDE) 12.5 MG capsule Take 1 capsule (12.5 mg total) by mouth daily. 30 capsule 0  . insulin aspart (NOVOLOG) 100 UNIT/ML injection Inject into the skin 3 (three) times daily before meals.    . insulin glargine (LANTUS) 100 UNIT/ML injection Inject 50 Units into the skin daily.    Marland Kitchen lisinopril (PRINIVIL,ZESTRIL) 40 MG tablet Take 40 mg by mouth daily.    . Multiple Vitamin (MULTI-VITAMINS) TABS Take 1 tablet by mouth daily.    . Omega-3 Fatty Acids (FISH OIL) 1000 MG CAPS Take 1 tablet by mouth daily.    Marland Kitchen omeprazole (PRILOSEC) 20 MG capsule Take 1 capsule by mouth daily.    Marland Kitchen diltiazem (CARDIZEM CD) 180 MG 24 hr capsule Take 1 capsule (180 mg total) by mouth daily. 90 capsule 3   No current facility-administered medications for this visit.      Allergies:   Levaquin [levofloxacin in d5w]   Social History:  The  patient  reports that she has been smoking. She has a 10.25 pack-year smoking history. She has never used smokeless tobacco. She reports that she drank alcohol. She reports that she has current or past drug history. Drug: Marijuana.   Family History:   family history includes CAD in her father; Diabetes in her mother; Heart Problems in her father; Heart failure in her father.    Review of Systems: Review of Systems  Constitutional: Negative.   Respiratory: Negative.   Cardiovascular: Negative.    Gastrointestinal: Negative.   Musculoskeletal: Negative.   Neurological: Negative.   Psychiatric/Behavioral: Negative.   All other systems reviewed and are negative.    PHYSICAL EXAM: VS:  BP 123/79 (BP Location: Right Arm, Patient Position: Sitting, Cuff Size: Normal)   Pulse 82   Ht 5\' 2"  (1.575 m)   Wt 139 lb 8 oz (63.3 kg)   BMI 25.51 kg/m  , BMI Body mass index is 25.51 kg/m. GEN: Well nourished, well developed, in no acute distress on oxygen HEENT: normal  Neck: no JVD, carotid bruits, or masses Cardiac: RRR; no murmurs, rubs, or gallops,no edema  Respiratory:  clear to auscultation bilaterally, normal work of breathing GI: soft, nontender, nondistended, + BS MS: no deformity or atrophy  Skin: warm and dry, no rash Neuro:  Strength and sensation are intact Psych: euthymic mood, full affect   Recent Labs: 01/29/2018: TSH 0.646 02/05/2018: B Natriuretic Peptide 339.0 02/21/2018: ALT 23; Hemoglobin 10.6; Magnesium 2.8; Platelets 397 02/22/2018: BUN 19; Creatinine, Ser 0.63; Potassium 4.2; Sodium 135    Lipid Panel Lab Results  Component Value Date   CHOL 136 01/30/2018   HDL 73 01/30/2018   LDLCALC 42 01/30/2018   TRIG 106 01/30/2018      Wt Readings from Last 3 Encounters:  03/18/18 139 lb 8 oz (63.3 kg)  02/21/18 140 lb 14 oz (63.9 kg)  02/13/18 144 lb 6.4 oz (65.5 kg)       ASSESSMENT AND PLAN:  PAF (paroxysmal atrial fibrillation) (HCC) - Plan: EKG 12-Lead We will continue on anticoagulation Refer to medical management for assistance with eliquis At a later date could change to xarelto Medication samples provided a new prescription as well as new coupon Elevated CHDS VASC given female, diabetic, vascular disease, hypertension When she is established with medical management we will change her to diltiazem extended release 180 mg daily  Diabetic ketoacidosis without coma associated with other specified diabetes mellitus ([place]) - Plan: EKG  12-Lead Numerous hospital admissions for DKA through July and August 2019 Reports she is on track, monitoring her diet closely  Substance abuse ([place]) Recommended cessation from any alcohol cocaine  Insulin dependent diabetes mellitus ([place]) Managed by primary care Long discussion concerning her diet   Total encounter time more than 45 minutes  Greater than 50% was spent in counseling and coordination of care with the patient  Follow-up 6 months  Orders Placed This Encounter  Procedures  . EKG 12-Lead     Signed, Esmond Plants, M.D., Ph.D. 03/18/2018  [place], [place]

## 2018-03-18 NOTE — Patient Instructions (Addendum)
Medication Instructions:   Stay on your curremt medications When you get set up with Medical management, Change the diltiazem to the 180 mg ER once a day  Medication Samples have been provided to the patient.  Drug name: Elilquis       Strength: 5 mg        Qty: 4 boxes  LOT: [id number]  Exp.Date: 6/21  Labwork:  No new labs needed  Testing/Procedures:  No further testing at this time   Follow-Up: It was a pleasure seeing you in the office today. Please call us if you have new issues that need to be addressed before your next appt.  [phone]  Your physician wants you to follow-up in: 6 months.  You will receive a reminder letter in the mail two months in advance. If you don't receive a letter, please call our office to schedule the follow-up appointment.  If you need a refill on your cardiac medications before your next appointment, please call your pharmacy.  For educational health videos Log in to : [email or web address] Or : [email or web address], password : [email or web address]

## 2018-08-30 ENCOUNTER — Ambulatory Visit: Payer: Self-pay | Admitting: Family Medicine

## 2018-09-15 IMAGING — CT CT HEAD W/O CM
3 of 5 series · 16 of 47 positions shown, 19 images · non-contrast
Comparison: None.

CLINICAL DATA: Seizure.

EXAM:
CT HEAD WITHOUT CONTRAST
TECHNIQUE: Contiguous axial images were obtained from the base of the skull
through the vertex without intravenous contrast.

[Series 3: head wo · axial · 0.42mm/px · z∈[-312,-187]mm · 11 of 31 slices shown, 14 images]
[im 3/31  brain]
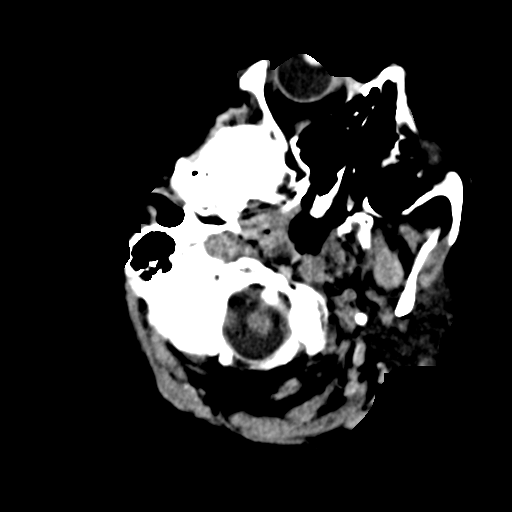
[im 3/31  bone]
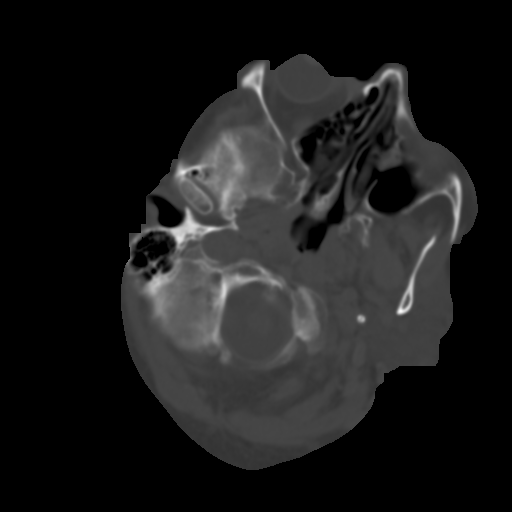
[im 5/31  brain]
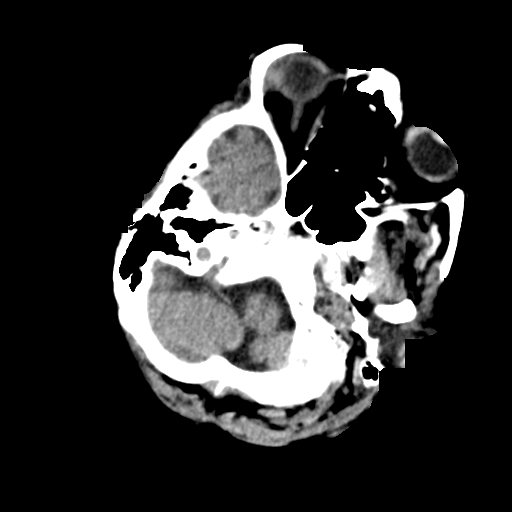
[im 7/31  brain]
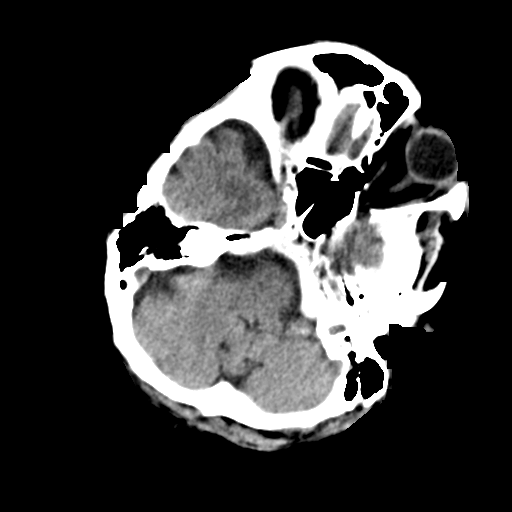
[im 11/31  brain]
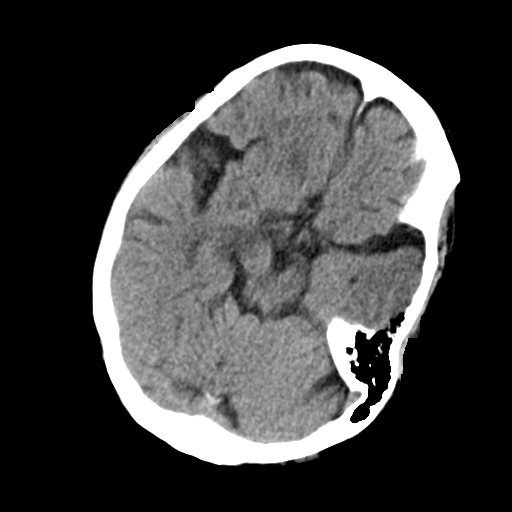
[im 13/31  brain]
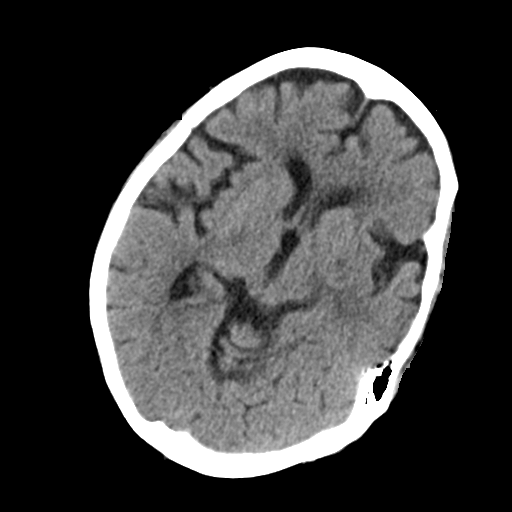
[im 13/31  bone]
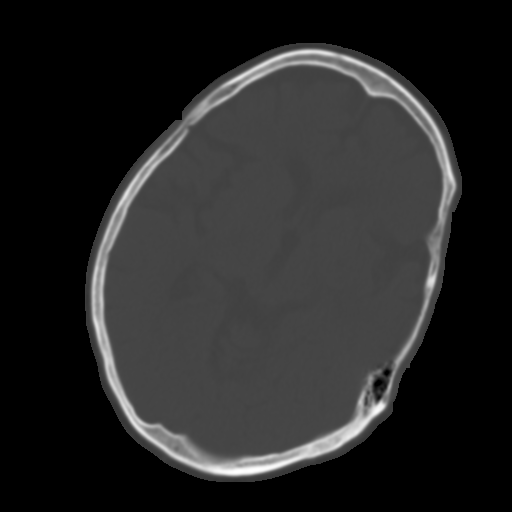
[im 16/31  brain]
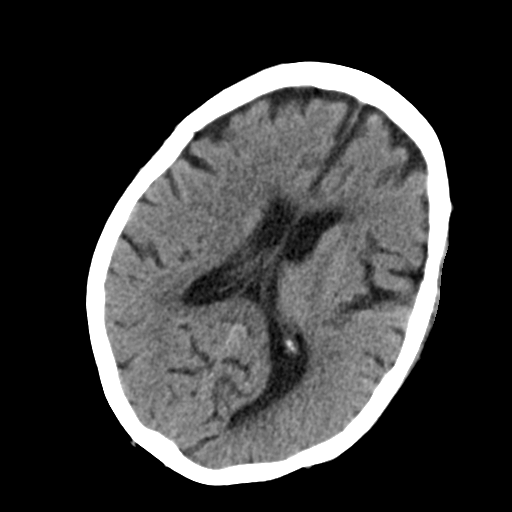
[im 18/31  brain]
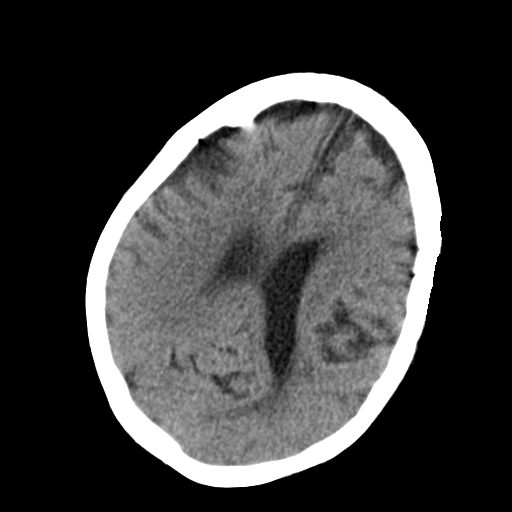
[im 20/31  brain]
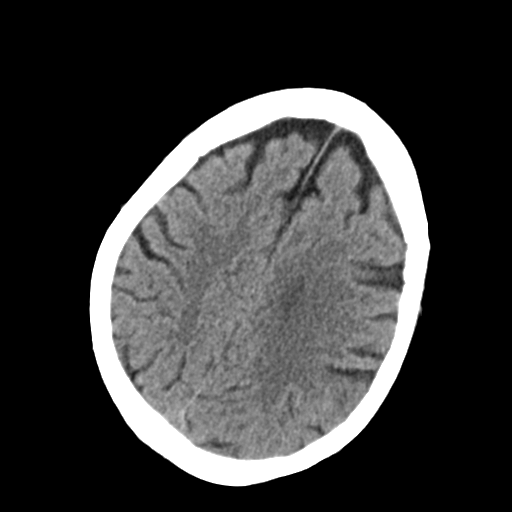
[im 24/31  brain]
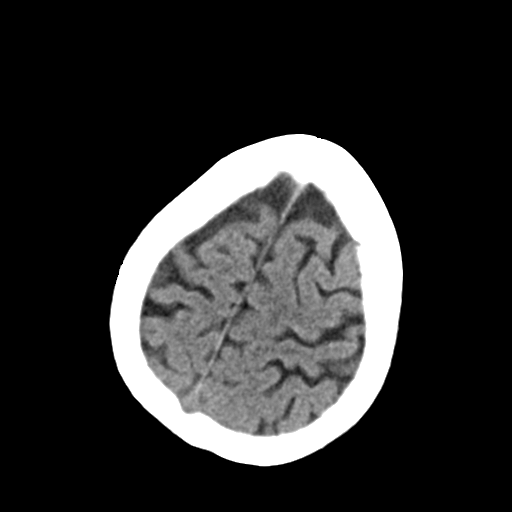
[im 24/31  bone]
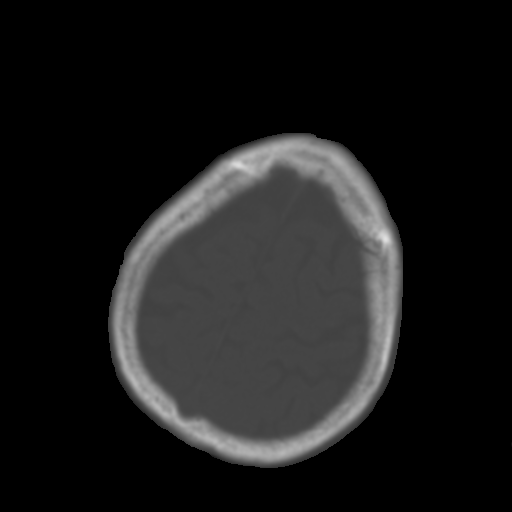
[im 26/31  brain]
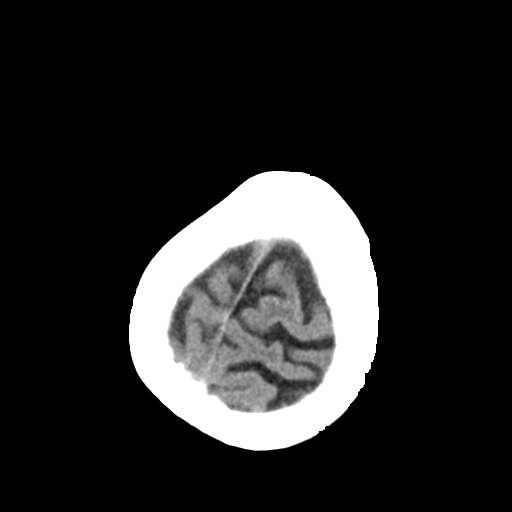
[im 28/31  brain]
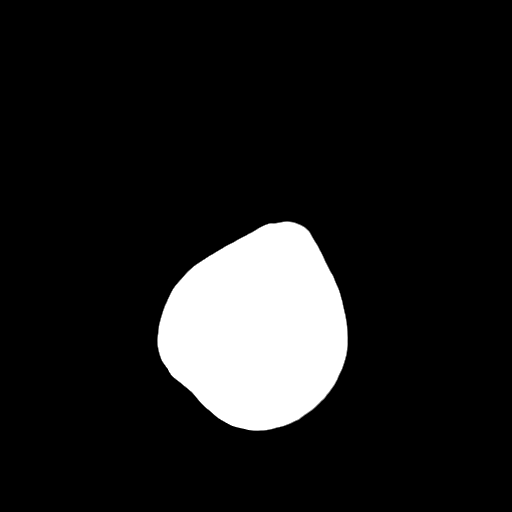

[Series 6: coronal soft tissue · coronal · 0.33mm/px · 3 of 65 slices shown]
[im 22/65  brain]
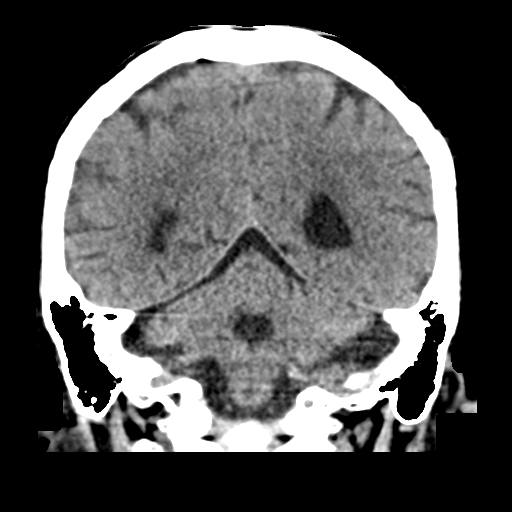
[im 29/65  brain]
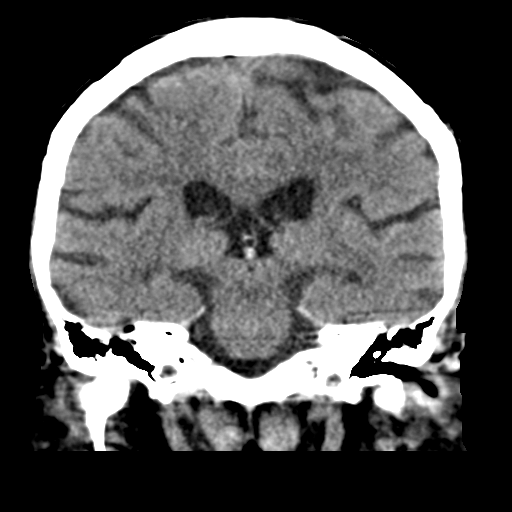
[im 36/65  brain]
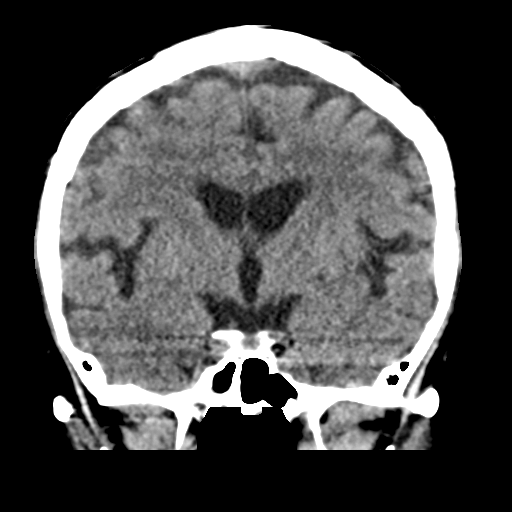

[Series 7: sagittal soft tissue · sagittal · 0.30mm/px · 2 of 55 slices shown]
[im 19/55  brain]
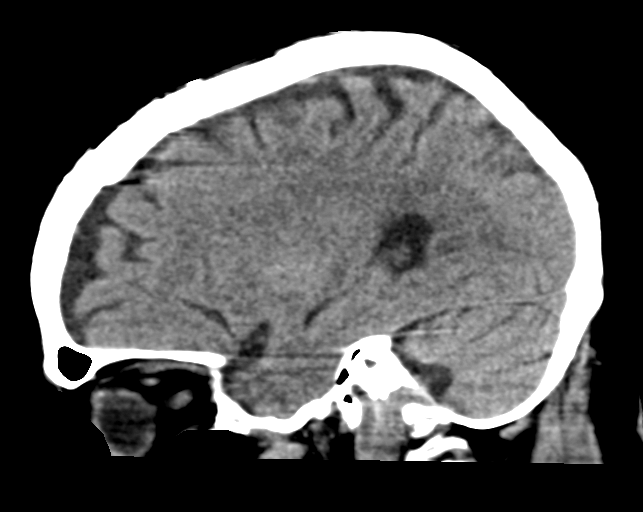
[im 37/55  brain]
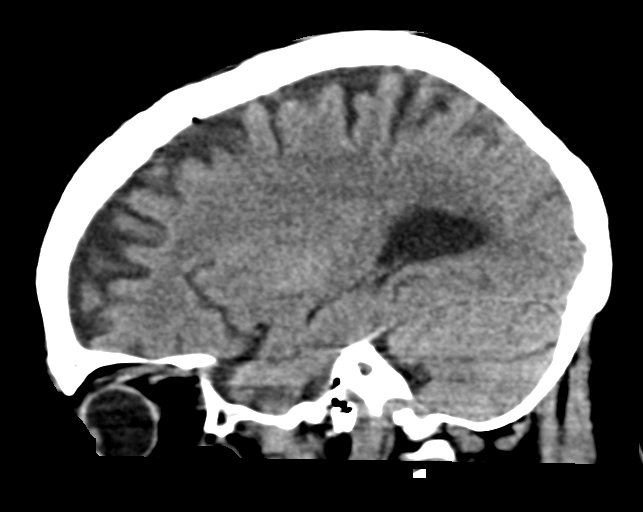

[16 of 47 positions shown; findings below may reference images not displayed]

FINDINGS: Brain: Mild diffuse cortical atrophy is noted. No mass effect or
midline shift is noted. Ventricular size is within normal limits.
There is no evidence of mass lesion, hemorrhage or acute infarction.

Vascular: No hyperdense vessel or unexpected calcification.

Skull: Normal. Negative for fracture or focal lesion.

Sinuses/Orbits: No acute finding.

Other: None.
IMPRESSION: Mild diffuse cortical atrophy. No acute intracranial abnormality
seen.

## 2018-11-12 ENCOUNTER — Telehealth: Payer: Self-pay

## 2018-11-12 NOTE — Telephone Encounter (Signed)
Called patient from recall list.  No answer. LMOV.  This is the 2nd attempt per recall list.

## 2018-11-24 NOTE — Telephone Encounter (Signed)
Called patient from recall. Patient would like to wait until office reopens.  Deleting old recall. NEW recall placed for August

## 2018-11-29 ENCOUNTER — Ambulatory Visit: Payer: Self-pay

## 2018-12-08 ENCOUNTER — Other Ambulatory Visit: Payer: Self-pay | Admitting: Podiatry

## 2018-12-08 DIAGNOSIS — S93325A Dislocation of tarsometatarsal joint of left foot, initial encounter: Secondary | ICD-10-CM

## 2018-12-08 DIAGNOSIS — E11610 Type 2 diabetes mellitus with diabetic neuropathic arthropathy: Secondary | ICD-10-CM

## 2018-12-16 ENCOUNTER — Other Ambulatory Visit: Payer: Self-pay

## 2018-12-16 ENCOUNTER — Ambulatory Visit
Admission: RE | Admit: 2018-12-16 | Discharge: 2018-12-16 | Disposition: A | Discharge status: Home / Self Care | Payer: Self-pay | Source: Ambulatory Visit | Attending: Podiatry | Admitting: Podiatry

## 2018-12-16 DIAGNOSIS — E11610 Type 2 diabetes mellitus with diabetic neuropathic arthropathy: Secondary | ICD-10-CM | POA: Insufficient documentation

## 2018-12-16 DIAGNOSIS — S93325A Dislocation of tarsometatarsal joint of left foot, initial encounter: Secondary | ICD-10-CM | POA: Insufficient documentation

## 2019-02-04 ENCOUNTER — Encounter: Payer: Self-pay | Admitting: Emergency Medicine

## 2019-02-04 ENCOUNTER — Emergency Department
Admission: EM | Admit: 2019-02-04 | Discharge: 2019-02-04 | Disposition: A | Discharge status: Home / Self Care | Payer: Self-pay | Attending: Internal Medicine | Admitting: Internal Medicine

## 2019-02-04 ENCOUNTER — Emergency Department: Payer: Self-pay

## 2019-02-04 ENCOUNTER — Other Ambulatory Visit: Payer: Self-pay

## 2019-02-04 DIAGNOSIS — I10 Essential (primary) hypertension: Secondary | ICD-10-CM | POA: Insufficient documentation

## 2019-02-04 DIAGNOSIS — F17200 Nicotine dependence, unspecified, uncomplicated: Secondary | ICD-10-CM | POA: Insufficient documentation

## 2019-02-04 DIAGNOSIS — K625 Hemorrhage of anus and rectum: Secondary | ICD-10-CM | POA: Insufficient documentation

## 2019-02-04 DIAGNOSIS — E119 Type 2 diabetes mellitus without complications: Secondary | ICD-10-CM | POA: Insufficient documentation

## 2019-02-04 DIAGNOSIS — Z79899 Other long term (current) drug therapy: Secondary | ICD-10-CM | POA: Insufficient documentation

## 2019-02-04 DIAGNOSIS — Z794 Long term (current) use of insulin: Secondary | ICD-10-CM | POA: Insufficient documentation

## 2019-02-04 DIAGNOSIS — Z7984 Long term (current) use of oral hypoglycemic drugs: Secondary | ICD-10-CM | POA: Insufficient documentation

## 2019-02-04 LAB — COMPREHENSIVE METABOLIC PANEL
ALT: 23 U/L (ref 0–44)
AST: 22 U/L (ref 15–41)
Albumin: 4.1 g/dL (ref 3.5–5.0)
Alkaline Phosphatase: 156 U/L — ABNORMAL HIGH (ref 38–126)
Anion gap: 12 (ref 5–15)
BUN: 18 mg/dL (ref 6–20)
CO2: 22 mmol/L (ref 22–32)
Calcium: 9.2 mg/dL (ref 8.9–10.3)
Chloride: 97 mmol/L — ABNORMAL LOW (ref 98–111)
Creatinine, Ser: 0.99 mg/dL (ref 0.44–1.00)
GFR calc Af Amer: >60 mL/min (ref >60)
GFR calc non Af Amer: >60 mL/min (ref >60)
Glucose, Bld: 400 mg/dL — ABNORMAL HIGH (ref 70–99)
Potassium: 4.8 mmol/L (ref 3.5–5.1)
Sodium: 131 mmol/L — ABNORMAL LOW (ref 135–145)
Total Bilirubin: 0.8 mg/dL (ref 0.3–1.2)
Total Protein: 8.3 g/dL — ABNORMAL HIGH (ref 6.5–8.1)

## 2019-02-04 LAB — CBC
HCT: 40.2 % (ref 36.0–46.0)
Hemoglobin: 13.5 g/dL (ref 12.0–15.0)
MCH: 32 pg (ref 26.0–34.0)
MCHC: 33.6 g/dL (ref 30.0–36.0)
MCV: 95.3 fL (ref 80.0–100.0)
Platelets: 280 10*3/uL (ref 150–400)
RBC: 4.22 MIL/uL (ref 3.87–5.11)
RDW: 14.3 % (ref 11.5–15.5)
WBC: 10 10*3/uL (ref 4.0–10.5)
nRBC: 0 % (ref 0.0–0.2)

## 2019-02-04 LAB — GLUCOSE, CAPILLARY: Glucose-Capillary: 312 mg/dL — ABNORMAL HIGH (ref 70–99)

## 2019-02-04 NOTE — ED Triage Notes (Addendum)
Pt arrives with concerns over 2 episodes of bleeding observed today. Pt reports she has had diarrhea since last evening. Pt states she felt so weak last night she fell in the bathroom. Pt denies hitting her head but does report left knee pain and right ribcage pain.

## 2019-02-04 NOTE — ED Provider Notes (Signed)
[facility] Emergency Department Provider Note   ____________________________________________   First MD Initiated Contact with Patient 02/04/19 1213     (approximate)  I have reviewed the triage vital signs and the nursing notes.   HISTORY  Chief Complaint Rectal Bleeding    HPI Michaela Johnson is a 58 y.o. female who reports onset of profuse diarrhea yesterday.  She had some which diarrhea that she fell off the toilet and bruised her right hand and her right ribs.  Her right ribs are still hurting.  Her hand is bruised on the dorsal surface but nontender on the palmar surface.  Today she had 3 episodes of passing blood instead of stool.  She is not having any nausea vomiting or any more diarrhea.  She has no belly pain.  She is not short of breath either from the rib pain.         Past Medical History:  Diagnosis Date  . Alcohol use   . Arrhythmia   . Depression   . Diabetes mellitus without complication ([place])   . GERD (gastroesophageal reflux disease)   . Hypertension   . Neuropathy   . PAF (paroxysmal atrial fibrillation) ([place])   . Tobacco use   . Vitamin D deficiency     Patient Active Problem List   Diagnosis Date Noted  . Substance abuse ([place]) 03/18/2018  . Insulin dependent diabetes mellitus ([place]) 03/18/2018  . H/O medication noncompliance 02/07/2018  . PAF (paroxysmal atrial fibrillation) ([place]) 01/30/2018  . Atrial fibrillation with rapid ventricular response ([place]) 01/30/2018  . Seizure ([place])   . DKA (diabetic ketoacidoses) ([place]) 06/07/2016    Past Surgical History:  Procedure Laterality Date  . NO PAST SURGERIES      Prior to Admission medications   Medication Sig Start Date End Date Taking? Authorizing Provider  albuterol (PROVENTIL HFA;VENTOLIN HFA) 108 (90 Base) MCG/ACT inhaler Inhale 2 puffs into the lungs every 6 (six) hours as needed for wheezing or shortness of breath. 02/14/18  Yes Pyreddy, Reatha Harps, MD  diltiazem  (CARDIZEM CD) 180 MG 24 hr capsule Take 1 capsule (180 mg total) by mouth daily. 03/18/18  Yes Minna Merritts, MD  FLUoxetine (PROZAC) 40 MG capsule Take 40 mg by mouth daily.   Yes [provider]  hydrochlorothiazide (MICROZIDE) 12.5 MG capsule Take 1 capsule (12.5 mg total) by mouth daily. 02/14/18 02/04/19 Yes Pyreddy, Reatha Harps, MD  insulin aspart (NOVOLOG) 100 UNIT/ML injection Inject 4 Units into the skin 2 (two) times a day.    Yes [provider]  insulin glargine (LANTUS) 100 UNIT/ML injection Inject 45 Units into the skin daily.    Yes [provider]  lisinopril (PRINIVIL,ZESTRIL) 40 MG tablet Take 40 mg by mouth daily.   Yes [provider]  omeprazole (PRILOSEC) 20 MG capsule Take 1 capsule by mouth daily.   Yes [provider]  rivaroxaban (XARELTO) 20 MG TABS tablet Take 20 mg by mouth daily.   Yes [provider]    Allergies Levaquin [levofloxacin in d5w]  Family History  Problem Relation Age of Onset  . Diabetes Mother   . CAD Father   . Heart Problems Father   . Heart failure Father     Social History Social History   Tobacco Use  . Smoking status: Current Some Day Smoker    Packs/day: 0.25    Years: 41.00    Pack years: 10.25  . Smokeless tobacco: Never Used  Substance Use Topics  .  Alcohol use: Not Currently    Comment: 42 per week  . Drug use: Not Currently    Types: Marijuana    Review of Systems  Constitutional: No fever/chills Eyes: No visual changes. ENT: No sore throat. Cardiovascular: Denies chest pain. Respiratory: Denies shortness of breath. Gastrointestinal: See HPI. Genitourinary: Negative for dysuria. Musculoskeletal: Negative for back pain. Skin: Negative for rash. Neurological: Negative for headaches, focal weakness   ____________________________________________   PHYSICAL EXAM:  VITAL SIGNS: ED Triage Vitals  Enc Vitals Group     BP 02/04/19 1034 131/69     Pulse Rate  02/04/19 1034 89     Resp 02/04/19 1034 17     Temp 02/04/19 1034 98.7 F (37.1 C)     Temp Source 02/04/19 1034 Oral     SpO2 02/04/19 1034 97 %     Weight 02/04/19 1035 159 lb (72.1 kg)     Height 02/04/19 1035 5\' 2"  (1.575 m)     Head Circumference --      Peak Flow --      Pain Score 02/04/19 1035 8     Pain Loc --      Pain Edu? --      Excl. in [place]? --     Constitutional: Alert and oriented. Well appearing and in no acute distress. Eyes: Conjunctivae are normal.  Head: Atraumatic. Nose: No congestion/rhinnorhea. Mouth/Throat: Mucous membranes are moist.  Oropharynx non-erythematous. Neck: No stridor. Cardiovascular: Normal rate, regular rhythm. Grossly normal heart sounds.  Good peripheral circulation. Respiratory: Normal respiratory effort.  No retractions. Lungs CTAB. Gastrointestinal: Soft and nontender. No distention. No abdominal bruits. No CVA tenderness. Musculoskeletal: No lower extremity tenderness nor edema.  Right hand is bruised and swollen on the dorsal surface of the hand but there is no wrist tenderness or palmar tenderness. Neurologic:  Normal speech and language. No gross focal neurologic deficits are appreciated.  Skin:  Skin is warm, dry and intact. No rash noted. Psychiatric: Mood and affect are normal. Speech and behavior are normal. Rectal: No hemorrhoids.  There are no palpable internal hemorrhoids either.  There is nothing in the rectal vault but when I take this nothing and Hemoccult that is Hemoccult positive. ____________________________________________   LABS (all labs ordered are listed, but only abnormal results are displayed)  Labs Reviewed  COMPREHENSIVE METABOLIC PANEL - Abnormal; Notable for the following components:      Result Value   Sodium 131 (*)    Chloride 97 (*)    Glucose, Bld 400 (*)    Total Protein 8.3 (*)    Alkaline Phosphatase 156 (*)    All other components within normal limits  GLUCOSE, CAPILLARY - Abnormal; Notable  for the following components:   Glucose-Capillary 312 (*)    All other components within normal limits  CBC  POC OCCULT BLOOD, ED   ____________________________________________  EKG  EKG read interpreted by me shows normal sinus rhythm rate of 93 normal axis left bundle branch block no acute ST-T changes left bundle branch is not new. ____________________________________________  RADIOLOGY  ED MD interpretation: X-rays read by radiology reviewed by me show no acute disease no obvious rib fractures  Official radiology report(s): Dg Abdomen Acute W/chest  Result Date: 02/04/2019 CLINICAL DATA:  58 year old with acute onset of diarrhea and rectal bleeding that began last night. Patient also fell in her bathroom and complains of RIGHT ANTERIOR rib pain. Initial encounter. EXAM: DG ABDOMEN ACUTE W/ 1V CHEST COMPARISON:  Chest x-ray  02/21/2018 and earlier. Abdomen x-ray 02/04/2018. FINDINGS: Bowel gas pattern unremarkable without evidence of obstruction or significant ileus. No evidence of free air or significant air-fluid levels on the erect image. Moderate stool burden in the colon. No visible opaque urinary tract calculi. Minimal aortic atherosclerosis. Benign bone island in the RIGHT iliac bone as noted previously. Cardiac silhouette upper normal in size, unchanged. Thoracic aorta mildly atherosclerotic, unchanged. Hilar and mediastinal contours otherwise unremarkable. Lungs clear. Bronchovascular markings normal. Pulmonary vascularity normal. No visible pleural effusions. No pneumothorax. IMPRESSION: No acute abdominal or pulmonary abnormality. Aortic Atherosclerosis (ICD10-170.0) Electronically Signed   By: Evangeline Dakin M.D.   On: 02/04/2019 13:57    ____________________________________________   PROCEDURES  Procedure(s) performed (including Critical Care):  Procedures   ____________________________________________   INITIAL IMPRESSION / ASSESSMENT AND PLAN / ED COURSE   Discussed patient with hospitalist and with Dr. Marius Ditch GI.  Patient had only one small 10 cc or less episode of bleeding here in the emergency room.  H&H is stable.  Her blood pressure and pulse rate are good.  Dr. Marius Ditch can see her in the office and do a colonoscopy.  We will have the patient return if she gets worse at all.              ____________________________________________   FINAL CLINICAL IMPRESSION(S) / ED DIAGNOSES  Final diagnoses:  Rectal bleeding     ED Discharge Orders    None       Note:  This document was prepared using Dragon voice recognition software and may include unintentional dictation errors.    Nena Polio, MD 02/04/19 [phone]

## 2019-02-04 NOTE — ED Notes (Signed)
See triage note. Pt states she has noted 2 episodes of rectal bleeding this morning, both times bright red blood noted. Pt denies any previous Hx of rectal bleeding. Pt currently taking eliquis due to her Hx of afib. Last dose taken this morning. Pt currently A&Ox4, NAD, no respiratory Sx noted.

## 2019-02-04 NOTE — ED Notes (Signed)
Blood bank called and stated that the 4th attempt to collect the ordered type and screen was also hemolyzed. Dr Cinda Quest notified and cancelled order. Blood bank notified.

## 2019-02-04 NOTE — Discharge Instructions (Signed)
Please return for heavier bleeding or return of lightheadedness.  Call [phone] if this happens. Continue your medicines.  Give Dr. Verlin Grills office a call and schedule an appointment with her.  She is the gastroenterologist and she will further evaluate your bleeding.  Let her know that you were seen in the emergency room for rectal bleeding that should make it easier to get an appointment soon.  Again please return here for any worsening.

## 2019-03-07 ENCOUNTER — Telehealth: Payer: Self-pay | Admitting: Cardiovascular Disease

## 2019-03-07 ENCOUNTER — Other Ambulatory Visit: Payer: Self-pay | Admitting: Cardiovascular Disease

## 2019-03-07 NOTE — Telephone Encounter (Signed)
Virtual Visit Pre-Appointment Phone Call  "(Name), I am calling you today to discuss your upcoming appointment. We are currently trying to limit exposure to the virus that causes COVID-19 by seeing patients at home rather than in the office."  1. "What is the BEST phone number to call the day of the visit?" - include this in appointment notes  2. Do you have or have access to (through a family member/friend) a smartphone with video capability that we can use for your visit?" a. If yes - list this number in appt notes as cell (if different from BEST phone #) and list the appointment type as a VIDEO visit in appointment notes b. If no - list the appointment type as a PHONE visit in appointment notes  3. Confirm consent - "In the setting of the current Covid19 crisis, you are scheduled for a (phone or video) visit with your provider on (date) at (time).  Just as we do with many in-office visits, in order for you to participate in this visit, we must obtain consent.  If you'd like, I can send this to your mychart (if signed up) or email for you to review.  Otherwise, I can obtain your verbal consent now.  All virtual visits are billed to your insurance company just like a normal visit would be.  By agreeing to a virtual visit, we'd like you to understand that the technology does not allow for your provider to perform an examination, and thus may limit your provider's ability to fully assess your condition. If your provider identifies any concerns that need to be evaluated in person, we will make arrangements to do so.  Finally, though the technology is pretty good, we cannot assure that it will always work on either your or our end, and in the setting of a video visit, we may have to convert it to a phone-only visit.  In either situation, we cannot ensure that we have a secure connection.  Are you willing to proceed?" STAFF: Did the patient verbally acknowledge consent to telehealth visit? Document  YES/NO here: YES  4. Advise patient to be prepared - "Two hours prior to your appointment, go ahead and check your blood pressure, pulse, oxygen saturation, and your weight (if you have the equipment to check those) and write them all down. When your visit starts, your provider will ask you for this information. If you have an Apple Watch or Kardia device, please plan to have heart rate information ready on the day of your appointment. Please have a pen and paper handy nearby the day of the visit as well."  5. Give patient instructions for MyChart download to smartphone OR Doximity/Doxy.me as below if video visit (depending on what platform provider is using)  6. Inform patient they will receive a phone call 15 minutes prior to their appointment time (may be from unknown caller ID) so they should be prepared to answer    TELEPHONE CALL NOTE  Michaela Johnson has been deemed a candidate for a follow-up tele-health visit to limit community exposure during the Covid-19 pandemic. I spoke with the patient via phone to ensure availability of phone/video source, confirm preferred email & phone number, and discuss instructions and expectations.  I reminded Michaela Johnson to be prepared with any vital sign and/or heart rhythm information that could potentially be obtained via home monitoring, at the time of her visit. I reminded Michaela Johnson to expect a phone call prior to her visit.  Saunders Glance Bumgarner 03/07/2019 3:03 PM   INSTRUCTIONS FOR DOWNLOADING THE MYCHART APP TO SMARTPHONE  - The patient must first make sure to have activated MyChart and know their login information - If Apple, go to [employer] and type in MyChart in the search bar and download the app. If Android, ask patient to go to [employer] and type in [place] in the search bar and download the app. The app is free but as with any other app downloads, their phone may require them to verify saved payment information or Apple/Android  password.  - The patient will need to then log into the app with their MyChart username and password, and select [facility] as their healthcare provider to link the account. When it is time for your visit, go to the MyChart app, find appointments, and click Begin Video Visit. Be sure to Select Allow for your device to access the Microphone and Camera for your visit. You will then be connected, and your provider will be with you shortly.  **If they have any issues connecting, or need assistance please contact MyChart service desk [phone])**  **If using a computer, in order to ensure the best quality for their visit they will need to use either of the following Internet Browsers: [employer], or Google Chrome**  IF USING DOXIMITY or DOXY.ME - The patient will receive a link just prior to their visit by text.     FULL LENGTH CONSENT FOR TELE-HEALTH VISIT   I hereby voluntarily request, consent and authorize [facility] and its employed or contracted physicians, physician assistants, nurse practitioners or other licensed health care professionals (the Practitioner), to provide me with telemedicine health care services (the Services") as deemed necessary by the treating Practitioner. I acknowledge and consent to receive the Services by the Practitioner via telemedicine. I understand that the telemedicine visit will involve communicating with the Practitioner through live audiovisual communication technology and the disclosure of certain medical information by electronic transmission. I acknowledge that I have been given the opportunity to request an in-person assessment or other available alternative prior to the telemedicine visit and am voluntarily participating in the telemedicine visit.  I understand that I have the right to withhold or withdraw my consent to the use of telemedicine in the course of my care at any time, without affecting my right to future care or treatment,  and that the Practitioner or I may terminate the telemedicine visit at any time. I understand that I have the right to inspect all information obtained and/or recorded in the course of the telemedicine visit and may receive copies of available information for a reasonable fee.  I understand that some of the potential risks of receiving the Services via telemedicine include:   Delay or interruption in medical evaluation due to technological equipment failure or disruption;  Information transmitted may not be sufficient (e.g. poor resolution of images) to allow for appropriate medical decision making by the Practitioner; and/or   In rare instances, security protocols could fail, causing a breach of personal health information.  Furthermore, I acknowledge that it is my responsibility to provide information about my medical history, conditions and care that is complete and accurate to the best of my ability. I acknowledge that Practitioner's advice, recommendations, and/or decision may be based on factors not within their control, such as incomplete or inaccurate data provided by me or distortions of diagnostic images or specimens that may result from electronic transmissions. I understand that the  practice of medicine is not an Chief Strategy Officer and that Practitioner makes no warranties or guarantees regarding treatment outcomes. I acknowledge that I will receive a copy of this consent concurrently upon execution via email to the email address I last provided but may also request a printed copy by calling the office of [facility].    I understand that my insurance will be billed for this visit.   I have read or had this consent read to me.  I understand the contents of this consent, which adequately explains the benefits and risks of the Services being provided via telemedicine.   I have been provided ample opportunity to ask questions regarding this consent and the Services and have had my questions  answered to my satisfaction.  I give my informed consent for the services to be provided through the use of telemedicine in my medical care  By participating in this telemedicine visit I agree to the above.

## 2019-03-07 NOTE — Telephone Encounter (Signed)
Pt overdue for 6 month f/u. Pt last seen 02/2018. Please contact pt for future appointment.

## 2019-03-08 NOTE — Progress Notes (Signed)
Virtual Visit via Video Note   This visit type was conducted due to national recommendations for restrictions regarding the COVID-19 Pandemic (e.g. social distancing) in an effort to limit this patient's exposure and mitigate transmission in our community.  Due to her co-morbid illnesses, this patient is at least at moderate risk for complications without adequate follow up.  This format is felt to be most appropriate for this patient at this time.  All issues noted in this document were discussed and addressed.  A limited physical exam was performed with this format.  Please refer to the patient's chart for her consent to telehealth for [facility].   I connected with  Michaela Johnson on 03/09/19 by a video enabled telemedicine application and verified that I am speaking with the correct person using two identifiers. I discussed the limitations of evaluation and management by telemedicine. The patient expressed understanding and agreed to proceed.   Evaluation Performed:  Follow-up visit  Date:  03/09/2019   ID:  Michaela Johnson, DOB 26-Aug-1960, MRN [id number]  Patient Location:  [address] [place] [address]   Provider location:   [facility], [place] office  PCP:  [name], NP  Cardiologist:  Arvid Right [facility]   Chief Complaint:  Atrial fib, paroxysmal  History of Present Illness:    Michaela Johnson is a 58 y.o. female who presents via audio/video conferencing for a telehealth visit today.   The patient does not symptoms concerning for COVID-19 infection (fever, chills, cough, or new SHORTNESS OF BREATH).   Patient has a past medical history of paroxysmal atrial fibrillation, August 2019 insulin-dependent diabetes,  prior history of DKA,  sunstance abuse including cocaine, alcohol abuse abuse neuropathy,  Admission to Admission to [facility] 01/2018 with DKA, nausea vomiting potassium 7.1 sodium level 123 Syncope in the setting of vasovagal episodes Who  presents to follow-up with her atrial fibrillation, syncope/vasovagal episodes  01/2019 seen in the ER  diarrhea, bleeding, weak Syncope sitting on the commode Felling bathroom, wrist injury On eliquis  Sees GI soon, given recent GI bleeding Diarrhea episodes  Dizzy yesterday, etiology unclear No recent fluttering  Sugars in 100s to 200s  Feels well  Other past medical hx reviewed 7/12 to 7./14 emesis, hyperkalemia,DKA, atrial fibrillation 7/17 to 7/28  Weakness emesis, hyperkalemia,DKA 8/4 to 8/5, PAF, emisis, hyperkalemia,DKA, treated with amiodaronetreated with amiodarone, eliquis   In follow-up today she reports having diarrhea one week ago, She thinks it was from drinking a Glucerna Felt dizzy while having the diarrhea in the bathroom, developed syncope Did not go to the hospital Hit the side of her face on the left   hemoglobin A1c 7.5   Prior CV studies:   The following studies were reviewed today:   Past Medical History:  Diagnosis Date  . Alcohol use   . Arrhythmia   . Depression   . Diabetes mellitus without complication ([place])   . GERD (gastroesophageal reflux disease)   . Hypertension   . Neuropathy   . PAF (paroxysmal atrial fibrillation) ([place])   . Tobacco use   . Vitamin D deficiency    Past Surgical History:  Procedure Laterality Date  . NO PAST SURGERIES       Allergies:   Levaquin [levofloxacin in d5w]   Social History   Tobacco Use  . Smoking status: Current Some Day Smoker    Packs/day: 0.25    Years: 41.00    Pack years: 10.25  . Smokeless  tobacco: Never Used  Substance Use Topics  . Alcohol use: Not Currently    Comment: 42 per week  . Drug use: Not Currently    Types: Marijuana     Current Outpatient Medications on File Prior to Visit  Medication Sig Dispense Refill  . albuterol (PROVENTIL HFA;VENTOLIN HFA) 108 (90 Base) MCG/ACT inhaler Inhale 2 puffs into the lungs every 6 (six) hours as needed for wheezing or shortness of  breath. 1 Inhaler 2  . diltiazem (CARDIZEM CD) 180 MG 24 hr capsule Take 1 capsule (180 mg total) by mouth daily. 90 capsule 3  . FLUoxetine (PROZAC) 40 MG capsule Take 40 mg by mouth daily.    . insulin aspart (NOVOLOG) 100 UNIT/ML injection Inject 4 Units into the skin 2 (two) times a day.     . insulin glargine (LANTUS) 100 UNIT/ML injection Inject 45 Units into the skin daily.     Marland Kitchen lisinopril (PRINIVIL,ZESTRIL) 40 MG tablet Take 40 mg by mouth daily.    Marland Kitchen omeprazole (PRILOSEC) 20 MG capsule Take 1 capsule by mouth daily.    . rivaroxaban (XARELTO) 20 MG TABS tablet Take 20 mg by mouth daily.     No current facility-administered medications on file prior to visit.      Family Hx: The patient's family history includes CAD in her father; Diabetes in her mother; Heart Problems in her father; Heart failure in her father.  ROS:   Please see the history of present illness.    Review of Systems  Constitutional: Negative.   HENT: Negative.   Respiratory: Negative.   Cardiovascular: Negative.   Gastrointestinal: Negative.   Musculoskeletal: Negative.   Neurological: Positive for loss of consciousness.  Psychiatric/Behavioral: Negative.   All other systems reviewed and are negative.    Labs/Other Tests and Data Reviewed:    Recent Labs: 02/04/2019: ALT 23; BUN 18; Creatinine, Ser 0.99; Hemoglobin 13.5; Platelets 280; Potassium 4.8; Sodium 131   Recent Lipid Panel Lab Results  Component Value Date/Time   CHOL 136 01/30/2018 05:31 AM   TRIG 106 01/30/2018 05:31 AM   HDL 73 01/30/2018 05:31 AM   CHOLHDL 1.9 01/30/2018 05:31 AM   LDLCALC 42 01/30/2018 05:31 AM    Wt Readings from Last 3 Encounters:  03/09/19 159 lb (72.1 kg)  02/04/19 159 lb (72.1 kg)  03/18/18 139 lb 8 oz (63.3 kg)     Exam:    Vital Signs: Vital signs may also be detailed in the HPI BP (!) 151/92 (BP Location: Left Arm, Patient Position: Sitting, Cuff Size: Normal)   Pulse 85   Ht 5\' 2"  (1.575 m)   Wt  159 lb (72.1 kg)   BMI 29.08 kg/m   Wt Readings from Last 3 Encounters:  03/09/19 159 lb (72.1 kg)  02/04/19 159 lb (72.1 kg)  03/18/18 139 lb 8 oz (63.3 kg)   Temp Readings from Last 3 Encounters:  02/04/19 98.7 F (37.1 C) (Oral)  02/22/18 98.5 F (36.9 C) (Oral)  02/14/18 98.2 F (36.8 C) (Oral)   BP Readings from Last 3 Encounters:  03/09/19 (!) 151/92  02/04/19 111/78  03/18/18 123/79   Pulse Readings from Last 3 Encounters:  03/09/19 85  02/04/19 90  03/18/18 82     Well nourished, well developed female in no acute distress. Constitutional:  oriented to person, place, and time. No distress.  Head: Normocephalic and atraumatic.  Eyes:  no discharge. No scleral icterus.  Neck: Normal range of motion. Neck  supple.  Pulmonary/Chest: No audible wheezing, no distress, appears comfortable Musculoskeletal: Normal range of motion.  no  tenderness or deformity.  Neurological:   Coordination normal. Full exam not performed Skin:  No rash Psychiatric:  normal mood and affect. behavior is normal. Thought content normal.    ASSESSMENT & PLAN:    Problem List Items Addressed This Visit      Cardiology Problems   PAF (paroxysmal atrial fibrillation) (HCC) - Primary (Chronic)     Other   Substance abuse (HCC)   Insulin dependent diabetes mellitus (HCC)   DKA (diabetic ketoacidoses) (HCC)     PAF (paroxysmal atrial fibrillation) ([place]) - Plan: EKG 12-Lead continue on anticoagulation Elevated CHDS VASC : female, diabetic, vascular disease, hypertension Continue diltiazem extended release 180 mg daily  on xarelto.  Denies any recent episodes  Diabetic ketoacidosis without coma associated with other specified diabetes mellitus ([place]) - Numerous hospital admissions for DKA through July and August 2019  Substance abuse [facility]) Recommended cessation from any alcohol, cocaine Again discussed with her in detail  Insulin dependent diabetes mellitus ([place]) Managed by primary  care Sugars continue to run high typically in the 200 range Diet discussed with her, recommended changes  Syncope Prior episodes, one recent episode seems to happen in the setting of diarrhea, vasovagal response Recommended hydration She is scheduled to see GI Recent episode of bloody bowel move x3 episodes recently, hematocrit stable Suggested she monitor blood pressure at home and if it runs low call her office we may need to decrease the dose of the diltiazem  GI bleed Recommend she stop Xarelto for any recurrent GI bleed call our office  COVID-19 Education: The signs and symptoms of COVID-19 were discussed with the patient and how to seek care for testing (follow up with PCP or arrange E-visit).  The importance of social distancing was discussed today.  Patient Risk:   After full review of this patients clinical status, I feel that they are at least moderate risk at this time.  Time:   Today, I have spent 25 minutes with the patient with telehealth technology discussing the cardiac and medical problems/diagnoses detailed above   Additional 10 min spent reviewing the chart prior to patient visit today   Medication Adjustments/Labs and Tests Ordered: Current medicines are reviewed at length with the patient today.  Concerns regarding medicines are outlined above.   Tests Ordered: No tests ordered   Medication Changes: No changes made   Disposition: Follow-up in 12 months   Signed, Ida Rogue, MD  [facility] [address], [place], [place] [address]

## 2019-03-09 ENCOUNTER — Other Ambulatory Visit: Payer: Self-pay

## 2019-03-09 ENCOUNTER — Encounter: Payer: Self-pay | Admitting: Cardiovascular Disease

## 2019-03-09 ENCOUNTER — Telehealth (INDEPENDENT_AMBULATORY_CARE_PROVIDER_SITE_OTHER): Payer: Self-pay | Admitting: Cardiovascular Disease

## 2019-03-09 VITALS — BP 151/92 | HR 85 | Ht 62.0 in | Wt 159.0 lb

## 2019-03-09 DIAGNOSIS — I480 Paroxysmal atrial fibrillation: Secondary | ICD-10-CM

## 2019-03-09 DIAGNOSIS — IMO0001 Reserved for inherently not codable concepts without codable children: Secondary | ICD-10-CM

## 2019-03-09 DIAGNOSIS — E1310 Other specified diabetes mellitus with ketoacidosis without coma: Secondary | ICD-10-CM

## 2019-03-09 DIAGNOSIS — R55 Syncope and collapse: Secondary | ICD-10-CM

## 2019-03-09 DIAGNOSIS — I4891 Unspecified atrial fibrillation: Secondary | ICD-10-CM

## 2019-03-09 DIAGNOSIS — F1910 Other psychoactive substance abuse, uncomplicated: Secondary | ICD-10-CM

## 2019-03-09 MED ORDER — RIVAROXABAN 20 MG PO TABS: 20.0000 mg | ORAL_TABLET | Freq: Every day | ORAL | 11 refills | Status: DC

## 2019-03-09 MED ORDER — DILTIAZEM HCL ER COATED BEADS 180 MG PO CP24: 180.0000 mg | ORAL_CAPSULE | Freq: Every day | ORAL | 3 refills | Status: DC

## 2019-03-09 MED ORDER — LISINOPRIL 40 MG PO TABS: 40.0000 mg | ORAL_TABLET | Freq: Every day | ORAL | 3 refills | Status: DC

## 2019-03-09 NOTE — Patient Instructions (Signed)

## 2019-03-29 ENCOUNTER — Ambulatory Visit: Payer: Self-pay | Admitting: Gastroenterology

## 2019-07-07 IMAGING — DX DG CHEST 1V PORT
1 series · 1 of 1 positions shown · non-contrast
Comparison: Portable exam 8289 hours compared to 04/15/2017

CLINICAL DATA: Leukocytosis

EXAM:
PORTABLE CHEST 1 VIEW

[chest ap]
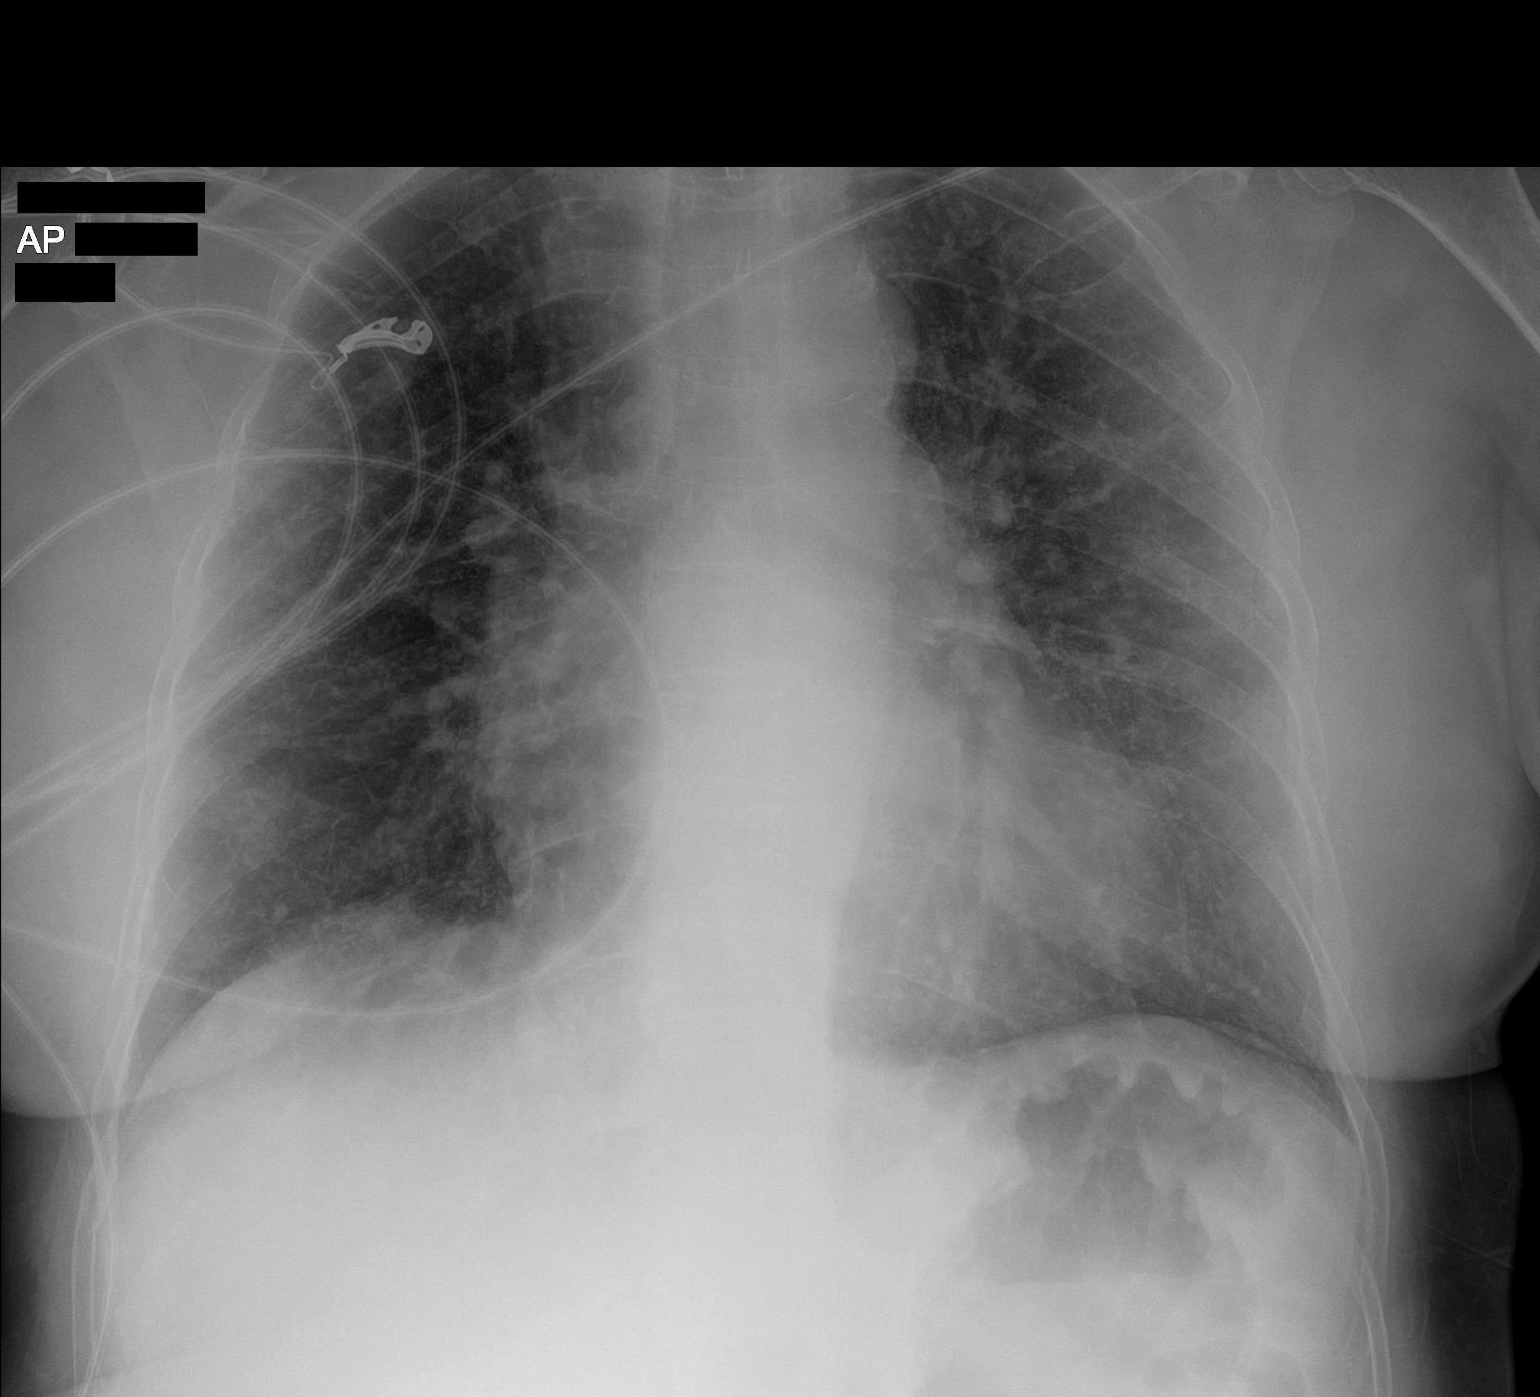

[1 of 1 positions shown; findings below may reference images not displayed]

FINDINGS: Enlargement of cardiac silhouette.

Mediastinal contours and pulmonary vascularity normal.

Atherosclerotic calcification aorta.

Minimal chronic accentuation of pulmonary markings, stable

Lungs otherwise clear.

No acute infiltrate, pleural effusion or pneumothorax.

Bones demineralized.
IMPRESSION: Enlargement of cardiac silhouette.

No acute abnormalities.

## 2019-07-09 IMAGING — DX DG CHEST 1V
1 series · 1 of 1 positions shown · non-contrast
Comparison: Chest x-ray from yesterday.

CLINICAL DATA: Shortness of breath.

EXAM:
CHEST  1 VIEW

[chest ap]
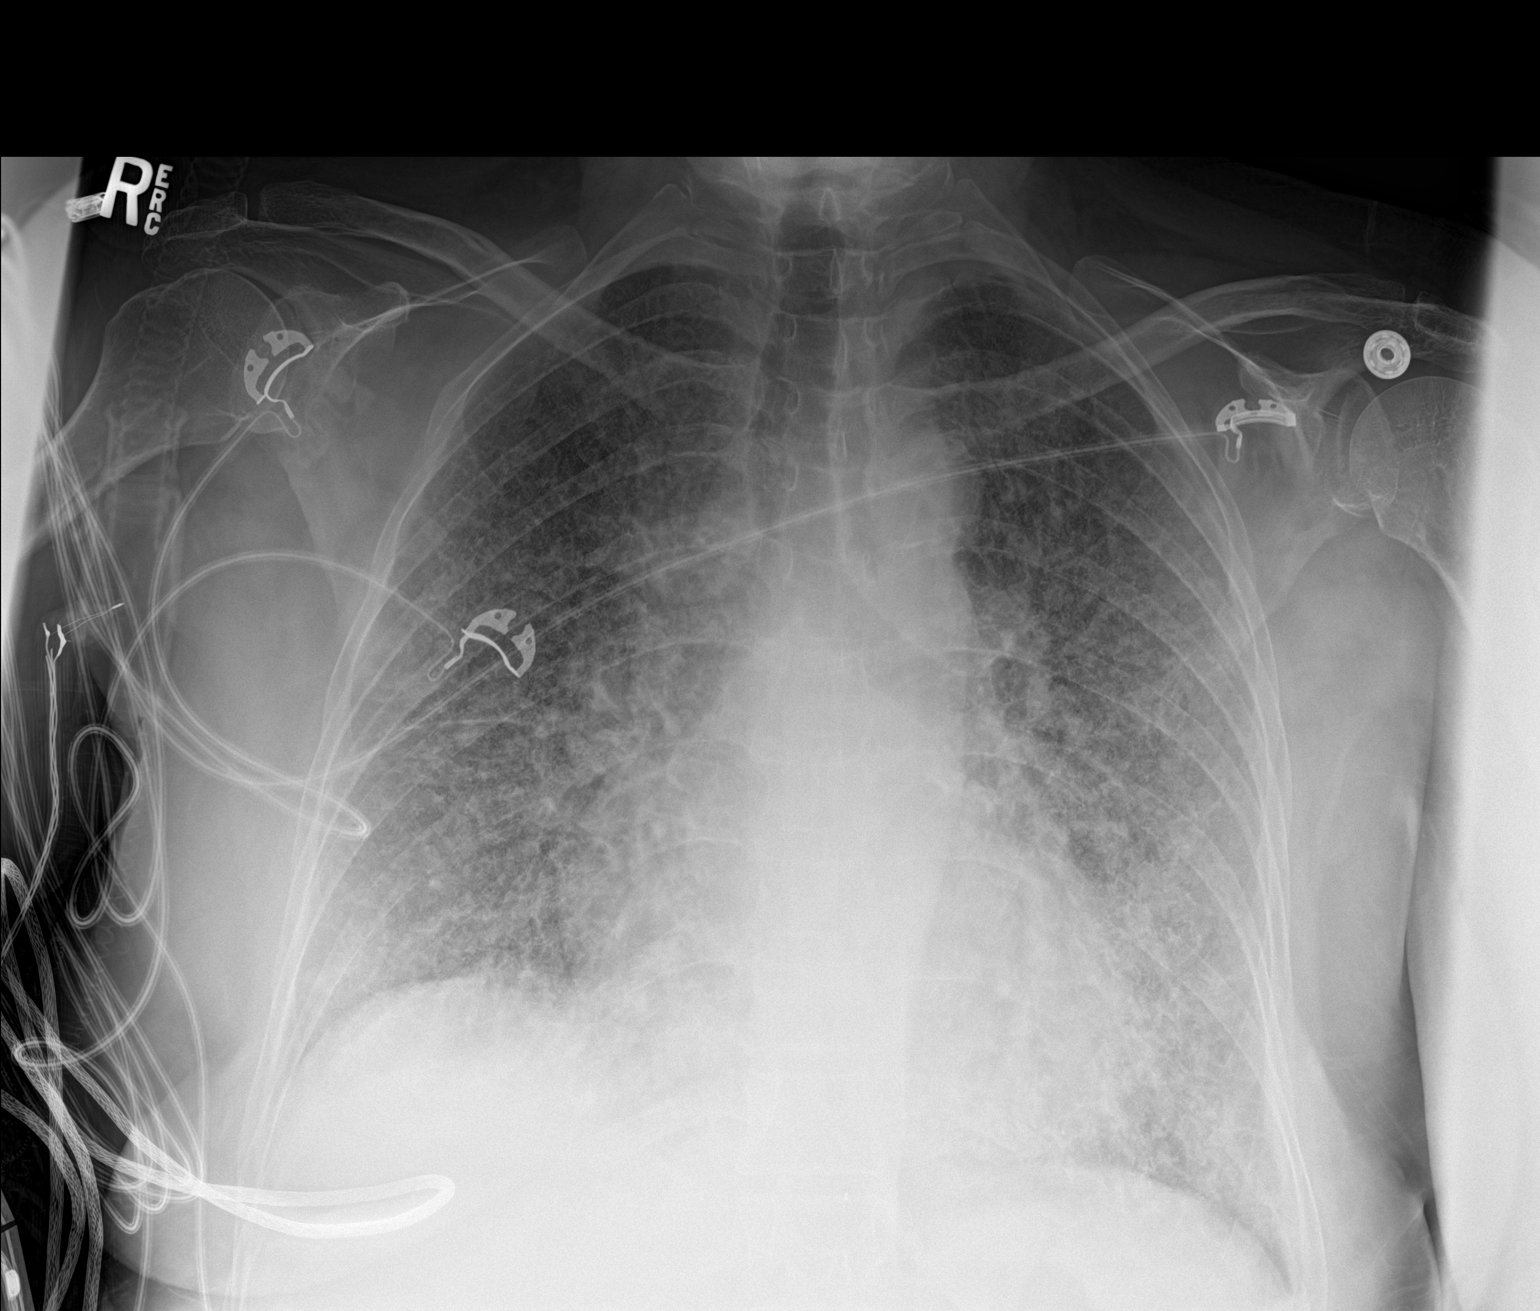

[1 of 1 positions shown; findings below may reference images not displayed]

FINDINGS: Stable cardiomediastinal silhouette. Diffuse interstitial opacities
are similar to prior study, with improved areas of more confluent
alveolar opacity seen on prior study. No pleural effusion or
pneumothorax. No acute osseous abnormality.
IMPRESSION: 1. Improved alveolar edema with no significant interval change in
diffuse interstitial edema.

## 2019-07-13 IMAGING — DX DG CHEST 1V PORT
1 series · 1 of 1 positions shown · non-contrast
Comparison: February 08, 2018 and April 15, 2017

CLINICAL DATA: Respiratory failure

EXAM:
PORTABLE CHEST 1 VIEW

[chest ap]
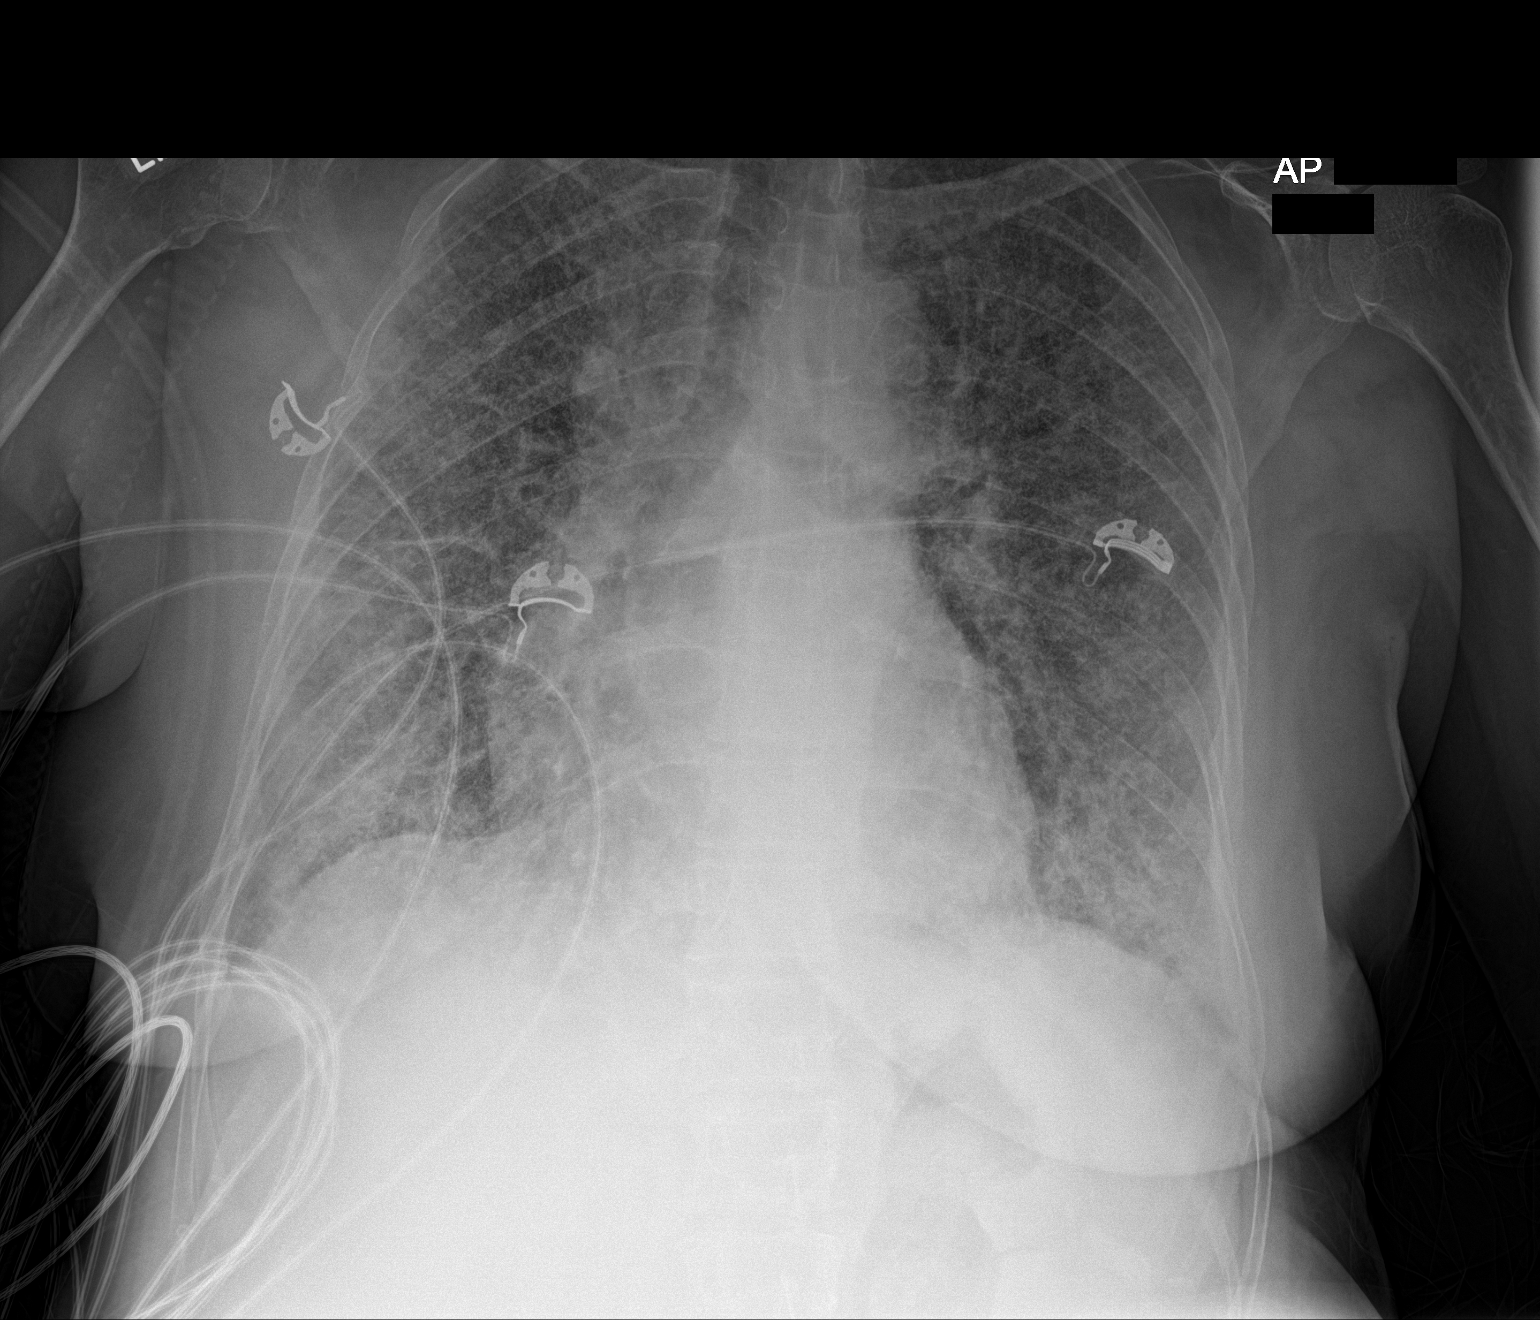

[1 of 1 positions shown; findings below may reference images not displayed]

FINDINGS: Diffuse interstitial edema remains. There is less consolidation in
the left lower lobe compared to recent study. Patchy alveolar
opacity on the right inferiorly is stable. Heart is upper normal in
size with pulmonary vascularity normal. No adenopathy. No bone
lesions.
IMPRESSION: Widespread interstitial edema. Patchy alveolar opacity in the bases
with partial clearing on the left and no change on the right. No new
opacity evident. Stable cardiac silhouette.

## 2019-11-24 ENCOUNTER — Encounter: Payer: Self-pay | Admitting: Emergency Medicine

## 2019-11-24 ENCOUNTER — Emergency Department: Payer: Self-pay

## 2019-11-24 ENCOUNTER — Emergency Department
Admission: EM | Admit: 2019-11-24 | Discharge: 2019-11-24 | Disposition: A | Discharge status: Home / Self Care | Payer: Self-pay | Attending: Emergency Medicine | Admitting: Emergency Medicine

## 2019-11-24 ENCOUNTER — Other Ambulatory Visit: Payer: Self-pay

## 2019-11-24 DIAGNOSIS — F17210 Nicotine dependence, cigarettes, uncomplicated: Secondary | ICD-10-CM | POA: Insufficient documentation

## 2019-11-24 DIAGNOSIS — Y999 Unspecified external cause status: Secondary | ICD-10-CM | POA: Insufficient documentation

## 2019-11-24 DIAGNOSIS — I10 Essential (primary) hypertension: Secondary | ICD-10-CM | POA: Insufficient documentation

## 2019-11-24 DIAGNOSIS — W01198A Fall on same level from slipping, tripping and stumbling with subsequent striking against other object, initial encounter: Secondary | ICD-10-CM | POA: Insufficient documentation

## 2019-11-24 DIAGNOSIS — Z794 Long term (current) use of insulin: Secondary | ICD-10-CM | POA: Insufficient documentation

## 2019-11-24 DIAGNOSIS — Y929 Unspecified place or not applicable: Secondary | ICD-10-CM | POA: Insufficient documentation

## 2019-11-24 DIAGNOSIS — E119 Type 2 diabetes mellitus without complications: Secondary | ICD-10-CM | POA: Insufficient documentation

## 2019-11-24 DIAGNOSIS — S42002A Fracture of unspecified part of left clavicle, initial encounter for closed fracture: Secondary | ICD-10-CM | POA: Insufficient documentation

## 2019-11-24 DIAGNOSIS — Z7901 Long term (current) use of anticoagulants: Secondary | ICD-10-CM | POA: Insufficient documentation

## 2019-11-24 DIAGNOSIS — Y939 Activity, unspecified: Secondary | ICD-10-CM | POA: Insufficient documentation

## 2019-11-24 MED ORDER — OXYCODONE-ACETAMINOPHEN 5-325 MG PO TABS
1.0000 | ORAL_TABLET | Freq: Once | ORAL | Status: AC
  Administered 2019-11-24: 11:00:00 1 via ORAL
  Filled 2019-11-24: qty 1

## 2019-11-24 MED ORDER — OXYCODONE-ACETAMINOPHEN 5-325 MG PO TABS: 1.0000 | ORAL_TABLET | Freq: Four times a day (QID) | ORAL | 0 refills | Status: AC | PRN

## 2019-11-24 NOTE — ED Notes (Signed)
See triage note  States she fell from bed last pm  Landed on left shoulder   Swelling and large amt of bruising noted  Good pulses  Increased apin with movement

## 2019-11-24 NOTE — ED Provider Notes (Signed)
[facility] Emergency Department Provider Note  ____________________________________________  Time seen: Approximately 10:15 AM  I have reviewed the triage vital signs and the nursing notes.   HISTORY  Chief Complaint Shoulder Pain and Shoulder Injury    HPI Michaela Johnson is a 59 y.o. female that presents to the emergency department for evaluation of left shoulder pain following a fall last night.  Patient states that she had acid reflux last night and got up out of bed and fell landing her left shoulder on her stepstool. She is sore throughout her left shoulder and left rib cage.  She has some bruising to her collarbone.  She did not hit her head or lose consciousness.  No neck pain.   Past Medical History:  Diagnosis Date  . Alcohol use   . Arrhythmia   . Depression   . Diabetes mellitus without complication ([place])   . GERD (gastroesophageal reflux disease)   . Hypertension   . Neuropathy   . PAF (paroxysmal atrial fibrillation) ([place])   . Tobacco use   . Vitamin D deficiency     Patient Active Problem List   Diagnosis Date Noted  . Substance abuse ([place]) 03/18/2018  . Insulin dependent diabetes mellitus 03/18/2018  . H/O medication noncompliance 02/07/2018  . PAF (paroxysmal atrial fibrillation) ([place]) 01/30/2018  . Atrial fibrillation with rapid ventricular response ([place]) 01/30/2018  . Seizure ([place])   . DKA (diabetic ketoacidoses) ([place]) 06/07/2016    Past Surgical History:  Procedure Laterality Date  . NO PAST SURGERIES      Prior to Admission medications   Medication Sig Start Date End Date Taking? Authorizing Provider  albuterol (PROVENTIL HFA;VENTOLIN HFA) 108 (90 Base) MCG/ACT inhaler Inhale 2 puffs into the lungs every 6 (six) hours as needed for wheezing or shortness of breath. 02/14/18   Pyreddy, Reatha Harps, MD  diltiazem (CARDIZEM CD) 180 MG 24 hr capsule Take 1 capsule (180 mg total) by mouth daily. 03/09/19   Minna Merritts, MD   FLUoxetine (PROZAC) 40 MG capsule Take 40 mg by mouth daily.    [provider]  insulin aspart (NOVOLOG) 100 UNIT/ML injection Inject 4 Units into the skin 2 (two) times a day.     [provider]  insulin glargine (LANTUS) 100 UNIT/ML injection Inject 45 Units into the skin daily.     [provider]  lisinopril (ZESTRIL) 40 MG tablet Take 1 tablet (40 mg total) by mouth daily. 03/09/19   Minna Merritts, MD  omeprazole (PRILOSEC) 20 MG capsule Take 1 capsule by mouth daily.    [provider]  oxyCODONE-acetaminophen (PERCOCET) 5-325 MG tablet Take 1 tablet by mouth every 6 (six) hours as needed for up to 4 days for severe pain. 11/24/19 11/28/19  Laban Emperor, PA-C  rivaroxaban (XARELTO) 20 MG TABS tablet Take 1 tablet (20 mg total) by mouth daily with supper. 03/09/19   Minna Merritts, MD    Allergies Levaquin [levofloxacin in d5w]  Family History  Problem Relation Age of Onset  . Diabetes Mother   . CAD Father   . Heart Problems Father   . Heart failure Father     Social History Social History   Tobacco Use  . Smoking status: Current Some Day Smoker    Packs/day: 0.25    Years: 41.00    Pack years: 10.25  . Smokeless tobacco: Never Used  Substance Use Topics  . Alcohol use: Not Currently    Comment: 42 per  week  . Drug use: Not Currently    Types: Marijuana     Review of Systems  Cardiovascular: No chest pain. Respiratory: No SOB. Gastrointestinal: No abdominal pain.  No nausea, no vomiting.  Musculoskeletal: Positive for shoulder and rib pain. Skin: Negative for rash, abrasions, lacerations, ecchymosis. Neurological: Negative for headaches, numbness or tingling   ____________________________________________   PHYSICAL EXAM:  VITAL SIGNS: ED Triage Vitals  Enc Vitals Group     BP 11/24/19 0911 (!) 151/76     Pulse Rate 11/24/19 0911 79     Resp 11/24/19 0911 18     Temp 11/24/19 0911 98.5 F (36.9 C)     Temp  Source 11/24/19 0911 Oral     SpO2 11/24/19 0911 97 %     Weight 11/24/19 0907 159 lb (72.1 kg)     Height 11/24/19 0907 5\' 2"  (1.575 m)     Head Circumference --      Peak Flow --      Pain Score 11/24/19 0906 10     Pain Loc --      Pain Edu? --      Excl. in [place]? --      Constitutional: Alert and oriented. Well appearing and in no acute distress. Eyes: Conjunctivae are normal. PERRL. EOMI. Head: Atraumatic. ENT:      Ears:      Nose: No congestion/rhinnorhea.      Mouth/Throat: Mucous membranes are moist.  Neck: No stridor.  No cervical spine tenderness to palpation. Cardiovascular: Normal rate, regular rhythm.  Good peripheral circulation.  Symmetric radial pulses bilaterally. Respiratory: Normal respiratory effort without tachypnea or retractions. Lungs CTAB. Good air entry to the bases with no decreased or absent breath sounds. Gastrointestinal: Soft and nontender to palpation. No guarding or rigidity. No palpable masses. No distention.  Musculoskeletal: Full range of motion to all extremities. No gross deformities appreciated.  Full range of motion of left shoulder but with pain.  Mild tenderness to palpation to left lateral rib cage.  No skin tenting. No tenderness to palpation over thoracic or lumbar spine.  Grip strength intact. Neurologic:  Normal speech and language. No gross focal neurologic deficits are appreciated.  Skin:  Skin is warm, dry and intact.  Ecchymosis to left mid clavicle. Psychiatric: Mood and affect are normal. Speech and behavior are normal. Patient exhibits appropriate insight and judgement.   ____________________________________________   LABS (all labs ordered are listed, but only abnormal results are displayed)  Labs Reviewed - No data to display ____________________________________________  EKG   ____________________________________________  RADIOLOGY Robinette Haines, personally viewed and evaluated these images (plain radiographs) as  part of my medical decision making, as well as reviewing the written report by the radiologist.  DG Ribs Unilateral W/Chest Left  Result Date: 11/24/2019 CLINICAL DATA:  Pain following fall EXAM: LEFT RIBS AND CHEST - 3+ VIEW COMPARISON:  Chest radiograph October 22, 2017 FINDINGS: Frontal chest as well as oblique and cone-down rib images were obtained. The lungs are clear. Heart size and pulmonary vascularity are normal. No adenopathy. There is a comminuted fracture of the lateral left clavicle. No pneumothorax or pleural effusion. No appreciable rib fracture. IMPRESSION: Comminuted fracture lateral left clavicle. No rib fracture evident. No pneumothorax. Lungs clear. Electronically Signed   By: Lowella Grip III M.D.   On: 11/24/2019 10:47   DG Shoulder Left  Result Date: 11/24/2019 CLINICAL DATA:  Fall. Fell on LEFT shoulder. LEFT shoulder pain. EXAM: LEFT SHOULDER -  2+ VIEW COMPARISON:  Chest x-ray on 02/21/2018 FINDINGS: There is an acute fracture of the LEFT clavicle, at the junction of the mid and distal thirds. There is overriding of fracture fragments and 1.5 shaft width displacement. Fracture is mildly comminuted. The scapula and proximal humerus are intact. LEFT lung apex is unremarkable. IMPRESSION: Acute fracture of the LEFT clavicle. Electronically Signed   By: Nolon Nations M.D.   On: 11/24/2019 09:35    ____________________________________________    PROCEDURES  Procedure(s) performed:    Procedures    Medications  oxyCODONE-acetaminophen (PERCOCET/ROXICET) 5-325 MG per tablet 1 tablet (1 tablet Oral Given 11/24/19 1035)     ____________________________________________   INITIAL IMPRESSION / ASSESSMENT AND PLAN / ED COURSE  Pertinent labs & imaging results that were available during my care of the patient were reviewed by me and considered in my medical decision making (see chart for details).  Review of the [place] CSRS was performed in accordance of the [place] prior to  dispensing any controlled drugs.   Patient presented to emergency department for evaluation after fall.  Vital signs and exam are reassuring.  Shoulder x-ray and chest x-ray consistent with left clavicle fracture.  Shoulder immobilizer was placed.  Patient will be discharged home with prescriptions for Percocet. Patient is to follow up with orthopedics as directed. Patient is given ED precautions to return to the ED for any worsening or new symptoms.  Michaela Johnson was evaluated in Emergency Department on 11/24/2019 for the symptoms described in the history of present illness. She was evaluated in the context of the global COVID-19 pandemic, which necessitated consideration that the patient might be at risk for infection with the SARS-CoV-2 virus that causes COVID-19. Institutional protocols and algorithms that pertain to the evaluation of patients at risk for COVID-19 are in a state of rapid change based on information released by regulatory bodies including the CDC and federal and state organizations. These policies and algorithms were followed during the patient's care in the ED.   ____________________________________________  FINAL CLINICAL IMPRESSION(S) / ED DIAGNOSES  Final diagnoses:  Closed displaced fracture of left clavicle, unspecified part of clavicle, initial encounter      NEW MEDICATIONS STARTED DURING THIS VISIT:  ED Discharge Orders         Ordered    oxyCODONE-acetaminophen (PERCOCET) 5-325 MG tablet  Every 6 hours PRN     11/24/19 1145              This chart was dictated using voice recognition software/Dragon. Despite best efforts to proofread, errors can occur which can change the meaning. Any change was purely unintentional.    Laban Emperor, PA-C 11/24/19 1533    Lavonia Drafts, MD 11/25/19 [id number]

## 2019-11-24 NOTE — ED Triage Notes (Signed)
Pt reports rolled off the bed last night and still having left shoulder pain today. Pt reports can lift left arm but really painful to do so.

## 2020-02-29 ENCOUNTER — Telehealth: Payer: Self-pay

## 2020-02-29 ENCOUNTER — Other Ambulatory Visit: Payer: Self-pay | Admitting: Cardiovascular Disease

## 2020-02-29 NOTE — Telephone Encounter (Signed)
-----   Message from Ronaldo Miyamoto, [place] sent at 02/29/2020 10:14 AM EDT ----- Please contact patient for appointment, she has not been seen since last year. She will need an appointment in order to get any further refills.   Thank you

## 2020-02-29 NOTE — Telephone Encounter (Signed)
Attempted to schedule no ans no vm  

## 2020-03-07 NOTE — Telephone Encounter (Signed)
Attempted to schedule no ans no vm  

## 2020-03-23 NOTE — Telephone Encounter (Signed)
Unable to leave msg to schedule

## 2020-04-03 ENCOUNTER — Other Ambulatory Visit: Payer: Self-pay | Admitting: Cardiovascular Disease

## 2020-04-03 NOTE — Progress Notes (Signed)
Cardiology Office Note    Date:  04/05/2020   ID:  Michaela Johnson, DOB [birth year], MRN [id number]  PCP:  [name], NP  Cardiologist:  Ida Rogue, MD  Electrophysiologist:  None   Chief Complaint: Follow up  History of Present Illness:   Michaela Johnson is a 59 y.o. female with history of PAF on Eliquis, insulin-dependent diabetes with history of DKA complicated by neuropathy, remote substance use including tobacco, alcohol and cocaine, syncope felt to be vasovagal in etiology, HTN, GI bleed, LBBB, and GERD who presents for follow-up of her A. Fib.  It appears the first time A. fib was noted on EKG in Epic was on 03/17/2010 with further details being unclear.  She has a history of left bundle branch block which dates back at least to 2017.  Over the years, she has had multiple admissions for DKA with several in 2019.  During her admission with DKA in 02/2018 she was also noted to be in A. fib and was started on Eliquis at that time.  Cardiology was not consulted.  She was seen in hospital follow-up in 02/2018 and was maintaining sinus rhythm with a LBBB.  She was seen in the ED in 01/2019 with diarrhea and hematochezia.  With this, she felt weak and had a possible syncopal episode felt to be vasovagal.  GI recommended outpatient follow-up.  She has not followed up with them yet.  She was last seen virtually in 02/2019 with recommendation to continue anticoagulation unless hematochezia returned.  She was seen in the ED in 11/2019 after sustaining a fall getting out of bed and fracturing her left clavicle.  She did not hit her head or suffer LOC.  She comes in doing well from a cardiac perspective.  She denies any chest pain, palpitations, dyspnea, dizziness, presyncope, or syncope.  No lower extremity swelling, abdominal distention, orthopnea, PND, early satiety.  No recent falls, hematochezia, or melena.  She is not interested in following up with GI at this time given she denies any  further bleeding.  She continues to smoke 1 pack of cigarettes per day and is not interested in quitting.  Not checking her blood pressure at home.  She is compliant with all cardiac medications.  She does not have any issues or concerns at this time.   Labs independently reviewed: 01/2019 - Hgb 13.5, PLT 280, potassium 4.8, BUN 18, serum creatinine 0.99, glucose 400, albumin 4.1, AST/ALT normal 01/2018 - A1c 7.3, TC 136, TG 106, HDL 73, LDL 42, TSH normal  Past Medical History:  Diagnosis Date  . Alcohol use   . Arrhythmia   . Depression   . Diabetes mellitus without complication ([place])   . GERD (gastroesophageal reflux disease)   . Hypertension   . Neuropathy   . PAF (paroxysmal atrial fibrillation) ([place])   . Tobacco use   . Vitamin D deficiency     Past Surgical History:  Procedure Laterality Date  . NO PAST SURGERIES      Current Medications: Current Meds  Medication Sig  . albuterol (PROVENTIL HFA;VENTOLIN HFA) 108 (90 Base) MCG/ACT inhaler Inhale 2 puffs into the lungs every 6 (six) hours as needed for wheezing or shortness of breath.  . Cholecalciferol (VITAMIN D3) 10 MCG (400 UNIT) tablet Take by mouth.  . diltiazem (CARDIZEM CD) 180 MG 24 hr capsule TAKE 1 CAPSULE BY MOUTH ONCE DAILY. CALL OFFICE TO SCHEDULE AN APPOINTMENT FOR FURTHER REFILLS.  Marland Kitchen FLUoxetine (PROZAC) 40 MG capsule Take  40 mg by mouth daily.  Marland Kitchen gabapentin (NEURONTIN) 300 MG capsule Take 300-600 mg by mouth daily.  . insulin aspart (NOVOLOG) 100 UNIT/ML injection Inject 4 Units into the skin 2 (two) times a day.   . insulin glargine (LANTUS) 100 UNIT/ML injection Inject 45 Units into the skin daily.   Marland Kitchen lisinopril (ZESTRIL) 40 MG tablet Take 1 tablet (40 mg total) by mouth daily.  Marland Kitchen omeprazole (PRILOSEC) 20 MG capsule Take 1 capsule by mouth daily.  . rivaroxaban (XARELTO) 20 MG TABS tablet Take 1 tablet (20 mg total) by mouth daily with supper.    Allergies:   Levaquin [levofloxacin in d5w]   Social  History   Socioeconomic History  . Marital status: Married    Spouse name: Not on file  . Number of children: Not on file  . Years of education: Not on file  . Highest education level: Not on file  Occupational History  . Not on file  Tobacco Use  . Smoking status: Current Some Day Smoker    Packs/day: 0.25    Years: 41.00    Pack years: 10.25  . Smokeless tobacco: Never Used  Substance and Sexual Activity  . Alcohol use: Not Currently    Comment: 42 per week  . Drug use: Not Currently    Types: Marijuana  . Sexual activity: Not on file  Other Topics Concern  . Not on file  Social History Narrative  . Not on file   Social Determinants of Health   Financial Resource Strain:   . Difficulty of Paying Living Expenses: Not on file  Food Insecurity:   . Worried About Charity fundraiser in the Last Year: Not on file  . Ran Out of Food in the Last Year: Not on file  Transportation Needs:   . Lack of Transportation (Medical): Not on file  . Lack of Transportation (Non-Medical): Not on file  Physical Activity:   . Days of Exercise per Week: Not on file  . Minutes of Exercise per Session: Not on file  Stress:   . Feeling of Stress : Not on file  Social Connections:   . Frequency of Communication with Friends and Family: Not on file  . Frequency of Social Gatherings with Friends and Family: Not on file  . Attends Religious Services: Not on file  . Active Member of Clubs or Organizations: Not on file  . Attends Archivist Meetings: Not on file  . Marital Status: Not on file     Family History:  The patient's family history includes CAD in her father; Diabetes in her mother; Heart Problems in her father; Heart failure in her father.  ROS:   Review of Systems  Constitutional: Negative for chills, diaphoresis, fever, malaise/fatigue and weight loss.  HENT: Negative for congestion.   Eyes: Negative for discharge and redness.  Respiratory: Negative for cough,  hemoptysis, sputum production, shortness of breath and wheezing.   Cardiovascular: Negative for chest pain, palpitations, orthopnea, claudication, leg swelling and PND.  Gastrointestinal: Negative for abdominal pain, blood in stool, heartburn, melena, nausea and vomiting.  Genitourinary: Negative for hematuria.  Musculoskeletal: Negative for falls and myalgias.  Skin: Negative for rash.  Neurological: Negative for dizziness, tingling, tremors, sensory change, speech change, focal weakness, loss of consciousness and weakness.  Endo/Heme/Allergies: Does not bruise/bleed easily.  Psychiatric/Behavioral: Negative for substance abuse. The patient is not nervous/anxious.   All other systems reviewed and are negative.    EKGs/Labs/Other Studies Reviewed:  Studies reviewed were summarized above. The additional studies were reviewed today:  2D echo 01/2018: - Left ventricle: The cavity size was mildly dilated. Wall  thickness was normal. Systolic function was normal. The estimated  ejection fraction was in the range of 50% to 55%. Wall motion was  normal; there were no regional wall motion abnormalities.   Impressions:   - Borderline overall LVF  Mild LVE  EF=50-55%  Normal Right side.    EKG:  EKG is ordered today.  The EKG ordered today demonstrates NSR, 78 bpm, LBBB (known)  Recent Labs: No results found for requested labs within last 8760 hours.  Recent Lipid Panel    Component Value Date/Time   CHOL 136 01/30/2018 0531   TRIG 106 01/30/2018 0531   HDL 73 01/30/2018 0531   CHOLHDL 1.9 01/30/2018 0531   VLDL 21 01/30/2018 0531   LDLCALC 42 01/30/2018 0531    PHYSICAL EXAM:    VS:  BP (!) 142/76   Pulse 84   Resp 15   Wt 158 lb 9.6 oz (71.9 kg)   HC 15" (38.1 cm)   SpO2 97%   BMI 29.01 kg/m   BMI: Body mass index is 29.01 kg/m.  Physical Exam Constitutional:      Appearance: She is well-developed.  HENT:     Head: Normocephalic and atraumatic.    Eyes:     General:        Right eye: No discharge.        Left eye: No discharge.  Neck:     Vascular: No JVD.  Cardiovascular:     Rate and Rhythm: Normal rate and regular rhythm.     Pulses: No midsystolic click and no opening snap.          Posterior tibial pulses are 2+ on the right side and 2+ on the left side.     Heart sounds: Normal heart sounds, S1 normal and S2 normal. Heart sounds not distant. No murmur heard.  No friction rub.  Pulmonary:     Effort: Pulmonary effort is normal. No respiratory distress.     Breath sounds: Normal breath sounds. No decreased breath sounds, wheezing or rales.  Chest:     Chest wall: No tenderness.  Abdominal:     General: There is no distension.     Palpations: Abdomen is soft.     Tenderness: There is no abdominal tenderness.  Musculoskeletal:     Cervical back: Normal range of motion.  Skin:    General: Skin is warm and dry.     Nails: There is no clubbing.  Neurological:     Mental Status: She is alert and oriented to person, place, and time.  Psychiatric:        Speech: Speech normal.        Behavior: Behavior normal.        Thought Content: Thought content normal.        Judgment: Judgment normal.     Wt Readings from Last 3 Encounters:  04/05/20 158 lb 9.6 oz (71.9 kg)  11/24/19 159 lb (72.1 kg)  03/09/19 159 lb (72.1 kg)     ASSESSMENT & PLAN:   1. PAF: She is maintaining sinus rhythm.  Titrate Cardizem CD to 240 mg daily given heart rates in the 80s bpm and mildly elevated BP.  Given her CHA2DS2-VASc at least 4 (HTN, DM, vascular disease, gender), she remains on indefinite Xarelto 20 mg q. dinner.  She has been  taking a baby aspirin for uncertain reasons and I have advised her to discontinue this.  No symptoms of bleeding or falls.  Check CBC and BMP.  2. Syncope: Thought to be vasovagal in etiology.  No further symptoms.  Recommend adequate hydration.  3. HTN: Blood pressure is mildly elevated in the office today.   Titrate Cardizem CD to 240 mg daily as outlined above.  Continue lisinopril 40 mg daily.  Check BMP.  4. History of GI bleed: She denies any further symptoms concerning for bleeding and does not want to follow-up with GI at this time.  She should let us or her PCP know if this changes.  5. Tobacco use: She continues to smoke 1 pack of cigarettes per day and is not interested in quitting.  She will contact us if this changes.  Disposition: F/u with Dr. Rockey Situ or an APP in 12 months, sooner if needed.   Medication Adjustments/Labs and Tests Ordered: Current medicines are reviewed at length with the patient today.  Concerns regarding medicines are outlined above. Medication changes, Labs and Tests ordered today are summarized above and listed in the Patient Instructions accessible in Encounters.   Signed, Christell Faith, PA-C 04/05/2020 8:56 AM     [address] [place],  [address] [phone]

## 2020-04-03 NOTE — Telephone Encounter (Signed)
Patient asking for a fib medication to be sent in to [facility] for 90 days  Patient is scheduled for an appointment on 9/16

## 2020-04-05 ENCOUNTER — Other Ambulatory Visit
Admission: RE | Admit: 2020-04-05 | Discharge: 2020-04-05 | Disposition: A | Discharge status: Home / Self Care | Payer: Self-pay | Source: Ambulatory Visit | Attending: Physician Assistant | Admitting: Physician Assistant

## 2020-04-05 ENCOUNTER — Ambulatory Visit (INDEPENDENT_AMBULATORY_CARE_PROVIDER_SITE_OTHER): Payer: Self-pay | Admitting: Physician Assistant

## 2020-04-05 ENCOUNTER — Telehealth: Payer: Self-pay

## 2020-04-05 ENCOUNTER — Encounter: Payer: Self-pay | Admitting: Physician Assistant

## 2020-04-05 ENCOUNTER — Other Ambulatory Visit: Payer: Self-pay

## 2020-04-05 VITALS — BP 142/76 | HR 84 | Resp 15 | Wt 158.6 lb

## 2020-04-05 DIAGNOSIS — I480 Paroxysmal atrial fibrillation: Secondary | ICD-10-CM

## 2020-04-05 DIAGNOSIS — Z8719 Personal history of other diseases of the digestive system: Secondary | ICD-10-CM

## 2020-04-05 DIAGNOSIS — I10 Essential (primary) hypertension: Secondary | ICD-10-CM

## 2020-04-05 DIAGNOSIS — Z720 Tobacco use: Secondary | ICD-10-CM

## 2020-04-05 DIAGNOSIS — R55 Syncope and collapse: Secondary | ICD-10-CM

## 2020-04-05 LAB — CBC WITH DIFFERENTIAL/PLATELET
Abs Immature Granulocytes: 0.02 10*3/uL (ref 0.00–0.07)
Basophils Absolute: 0.1 10*3/uL (ref 0.0–0.1)
Basophils Relative: 1 %
Eosinophils Absolute: 0.1 10*3/uL (ref 0.0–0.5)
Eosinophils Relative: 2 %
HCT: 40.5 % (ref 36.0–46.0)
Hemoglobin: 13.9 g/dL (ref 12.0–15.0)
Immature Granulocytes: 0 %
Lymphocytes Relative: 24 %
Lymphs Abs: 1.6 10*3/uL (ref 0.7–4.0)
MCH: 33.7 pg (ref 26.0–34.0)
MCHC: 34.3 g/dL (ref 30.0–36.0)
MCV: 98.1 fL (ref 80.0–100.0)
Monocytes Absolute: 0.4 10*3/uL (ref 0.1–1.0)
Monocytes Relative: 6 %
Neutro Abs: 4.5 10*3/uL (ref 1.7–7.7)
Neutrophils Relative %: 67 %
Platelets: 292 10*3/uL (ref 150–400)
RBC: 4.13 MIL/uL (ref 3.87–5.11)
RDW: 13.3 % (ref 11.5–15.5)
WBC: 6.7 10*3/uL (ref 4.0–10.5)
nRBC: 0 % (ref 0.0–0.2)

## 2020-04-05 LAB — BASIC METABOLIC PANEL
Anion gap: 10 (ref 5–15)
BUN: 15 mg/dL (ref 6–20)
CO2: 26 mmol/L (ref 22–32)
Calcium: 9.7 mg/dL (ref 8.9–10.3)
Chloride: 98 mmol/L (ref 98–111)
Creatinine, Ser: 0.79 mg/dL (ref 0.44–1.00)
GFR calc Af Amer: >60 mL/min (ref >60)
GFR calc non Af Amer: >60 mL/min (ref >60)
Glucose, Bld: 181 mg/dL — ABNORMAL HIGH (ref 70–99)
Potassium: 4.7 mmol/L (ref 3.5–5.1)
Sodium: 134 mmol/L — ABNORMAL LOW (ref 135–145)

## 2020-04-05 MED ORDER — DILTIAZEM HCL ER COATED BEADS 240 MG PO CP24: 240.0000 mg | ORAL_CAPSULE | Freq: Every day | ORAL | 3 refills | Status: DC

## 2020-04-05 NOTE — Telephone Encounter (Signed)
-----   Message from Rise Mu, PA-C sent at 04/05/2020 10:21 AM EDT ----- Renal function is normal.  Potassium at goal.  Random glucose is elevated, though improved from prior readings with known diabetes.  Blood count is normal.   No changes.

## 2020-04-05 NOTE — Telephone Encounter (Signed)
Attempted to contact the patient x2. Message sts that the "call cannot be completed at this time". Will attempt again.

## 2020-04-05 NOTE — Patient Instructions (Signed)
Medication Instructions:  Your physician has recommended you make the following change in your medication:  1- INCREASE Diltiazem to 240 mg by mouth once a day.  *If you need a refill on your cardiac medications before your next appointment, please call your pharmacy*   Lab Work: Your physician recommends that you return for lab work in: [place] in the [place] for BMET and CBC.  - Please go to the [facility]. You will check in at the front desk to the right as you walk into the atrium. Valet Parking is offered if needed. - No appointment needed. You may go any day between 7 am and 6 pm.  If you have labs (blood work) drawn today and your tests are completely normal, you will receive your results only by: Marland Kitchen MyChart Message (if you have MyChart) OR . A paper copy in the mail If you have any lab test that is abnormal or we need to change your treatment, we will call you to review the results.   Testing/Procedures: none  Follow-Up: At [facility], you and your health needs are our priority.  As part of our continuing mission to provide you with exceptional heart care, we have created designated Provider Care Teams.  These Care Teams include your primary Cardiologist (physician) and Advanced Practice Providers (APPs -  Physician Assistants and Nurse Practitioners) who all work together to provide you with the care you need, when you need it.  We recommend signing up for the patient portal called "MyChart".  Sign up information is provided on this After Visit Summary.  MyChart is used to connect with patients for Virtual Visits (Telemedicine).  Patients are able to view lab/test results, encounter notes, upcoming appointments, etc.  Non-urgent messages can be sent to your provider as well.   To learn more about what you can do with MyChart, go to [email or web address].    Your next appointment:   12 month(s)  The format for your next appointment:   In Person  Provider:     You may see Ida Rogue, MD or one of the following Advanced Practice Providers on your designated Care Team:    Murray Hodgkins, NP  Christell Faith, PA-C  Marrianne Mood, PA-C

## 2020-04-06 NOTE — Telephone Encounter (Signed)
Attempted to contact the patient by phone. Message received stating "call cannot be completed at this time." Will try again at a later time.

## 2020-04-09 ENCOUNTER — Encounter: Payer: Self-pay | Admitting: *Deleted

## 2020-04-09 NOTE — Telephone Encounter (Signed)
3rd attempt to contact the patient. Message received stating "call cannot be completed at this time." Letter or results being mailed to the patient.  Closing this encounter.

## 2020-06-27 ENCOUNTER — Encounter: Payer: Self-pay | Admitting: Emergency Medicine

## 2020-06-27 ENCOUNTER — Emergency Department
Admission: EM | Admit: 2020-06-27 | Discharge: 2020-06-27 | Disposition: A | Discharge status: Home / Self Care | Payer: Self-pay | Attending: Emergency Medicine | Admitting: Emergency Medicine

## 2020-06-27 ENCOUNTER — Emergency Department: Payer: Self-pay

## 2020-06-27 ENCOUNTER — Ambulatory Visit: Payer: Self-pay | Attending: Oncology

## 2020-06-27 ENCOUNTER — Other Ambulatory Visit: Payer: Self-pay

## 2020-06-27 DIAGNOSIS — E119 Type 2 diabetes mellitus without complications: Secondary | ICD-10-CM | POA: Insufficient documentation

## 2020-06-27 DIAGNOSIS — I480 Paroxysmal atrial fibrillation: Secondary | ICD-10-CM | POA: Insufficient documentation

## 2020-06-27 DIAGNOSIS — X501XXA Overexertion from prolonged static or awkward postures, initial encounter: Secondary | ICD-10-CM | POA: Insufficient documentation

## 2020-06-27 DIAGNOSIS — F17200 Nicotine dependence, unspecified, uncomplicated: Secondary | ICD-10-CM | POA: Insufficient documentation

## 2020-06-27 DIAGNOSIS — Z79899 Other long term (current) drug therapy: Secondary | ICD-10-CM | POA: Insufficient documentation

## 2020-06-27 DIAGNOSIS — I10 Essential (primary) hypertension: Secondary | ICD-10-CM | POA: Insufficient documentation

## 2020-06-27 DIAGNOSIS — Y92000 Kitchen of unspecified non-institutional (private) residence as  the place of occurrence of the external cause: Secondary | ICD-10-CM | POA: Insufficient documentation

## 2020-06-27 DIAGNOSIS — Z794 Long term (current) use of insulin: Secondary | ICD-10-CM | POA: Insufficient documentation

## 2020-06-27 DIAGNOSIS — Z7901 Long term (current) use of anticoagulants: Secondary | ICD-10-CM | POA: Insufficient documentation

## 2020-06-27 DIAGNOSIS — S82841A Displaced bimalleolar fracture of right lower leg, initial encounter for closed fracture: Secondary | ICD-10-CM | POA: Insufficient documentation

## 2020-06-27 MED ORDER — OXYCODONE-ACETAMINOPHEN 7.5-325 MG PO TABS: 1.0000 | ORAL_TABLET | Freq: Four times a day (QID) | ORAL | 0 refills | Status: DC | PRN

## 2020-06-27 MED ORDER — HYDROMORPHONE HCL 1 MG/ML IJ SOLN
1.0000 mg | Freq: Once | INTRAMUSCULAR | Status: AC
  Administered 2020-06-27: 1 mg via INTRAMUSCULAR
  Filled 2020-06-27: qty 1

## 2020-06-27 MED ORDER — ONDANSETRON 8 MG PO TBDP
8.0000 mg | ORAL_TABLET | Freq: Once | ORAL | Status: AC
  Administered 2020-06-27: 8 mg via ORAL
  Filled 2020-06-27: qty 1

## 2020-06-27 NOTE — ED Triage Notes (Signed)
Pt st on Saturday she fell coming out of BR, injuring rt ankle; was able to ambulate with only mild diff but on Sunday while standing in kitchen, twisted right leg and felt "pop" and was then unable to weight-bear on it afterwards; pt c/o swelling and pain to rt lower leg/ankle

## 2020-06-27 NOTE — ED Provider Notes (Addendum)
[facility] Emergency Department Provider Note   ____________________________________________   First MD Initiated Contact with Patient 06/27/20 0740     (approximate)  I have reviewed the triage vital signs and the nursing notes.   HISTORY  Chief Complaint Ankle Injury    HPI Michaela Johnson is a 59 y.o. female patient complain of right ankle pain secondary to fall, of the bathroom for days ago.  Patient states status post fall she was able to ambulate with mild difficulty but 2 days ago she had a twisting incident and felt a "pop" was unable to bear weight.  Patient state now pain and swelling to the right ankle.  Denies loss of sensation.  Rates pain as a 10/10.  Described pain as "achy".  No palliative measure for complaint.         Past Medical History:  Diagnosis Date  . Alcohol use   . Arrhythmia   . Depression   . Diabetes mellitus without complication ([place])   . GERD (gastroesophageal reflux disease)   . Hypertension   . Neuropathy   . PAF (paroxysmal atrial fibrillation) ([place])   . Tobacco use   . Vitamin D deficiency     Patient Active Problem List   Diagnosis Date Noted  . Substance abuse ([place]) 03/18/2018  . Insulin dependent diabetes mellitus 03/18/2018  . H/O medication noncompliance 02/07/2018  . PAF (paroxysmal atrial fibrillation) ([place]) 01/30/2018  . Atrial fibrillation with rapid ventricular response ([place]) 01/30/2018  . Seizure ([place])   . DKA (diabetic ketoacidoses) 06/07/2016    Past Surgical History:  Procedure Laterality Date  . NO PAST SURGERIES      Prior to Admission medications   Medication Sig Start Date End Date Taking? Authorizing Provider  albuterol (PROVENTIL HFA;VENTOLIN HFA) 108 (90 Base) MCG/ACT inhaler Inhale 2 puffs into the lungs every 6 (six) hours as needed for wheezing or shortness of breath. 02/14/18   Pyreddy, Reatha Harps, MD  Cholecalciferol (VITAMIN D3) 10 MCG (400 UNIT) tablet Take by mouth.     [provider]  diltiazem (CARDIZEM CD) 240 MG 24 hr capsule Take 1 capsule (240 mg total) by mouth daily. 04/05/20 07/04/20  Rise Mu, PA-C  FLUoxetine (PROZAC) 40 MG capsule Take 40 mg by mouth daily.    [provider]  gabapentin (NEURONTIN) 300 MG capsule Take 300-600 mg by mouth daily. 02/28/20   [provider]  insulin aspart (NOVOLOG) 100 UNIT/ML injection Inject 4 Units into the skin 2 (two) times a day.     [provider]  insulin glargine (LANTUS) 100 UNIT/ML injection Inject 45 Units into the skin daily.     [provider]  lisinopril (ZESTRIL) 40 MG tablet Take 1 tablet (40 mg total) by mouth daily. 03/09/19   Minna Merritts, MD  omeprazole (PRILOSEC) 20 MG capsule Take 1 capsule by mouth daily.    [provider]  oxyCODONE-acetaminophen (PERCOCET) 7.5-325 MG tablet Take 1 tablet by mouth every 6 (six) hours as needed for severe pain. 06/27/20   Sable Feil, PA-C  rivaroxaban (XARELTO) 20 MG TABS tablet Take 1 tablet (20 mg total) by mouth daily with supper. 03/09/19   Minna Merritts, MD    Allergies Levaquin [levofloxacin in d5w]  Family History  Problem Relation Age of Onset  . Diabetes Mother   . CAD Father   . Heart Problems Father   . Heart failure Father     Social History Social History  Tobacco Use  . Smoking status: Current Some Day Smoker    Packs/day: 0.25    Years: 41.00    Pack years: 10.25  . Smokeless tobacco: Never Used  Substance Use Topics  . Alcohol use: Not Currently    Comment: 42 per week  . Drug use: Not Currently    Types: Marijuana    Review of Systems Constitutional: No fever/chills Eyes: No visual changes. ENT: No sore throat. Cardiovascular: Denies chest pain. Respiratory: Denies shortness of breath. Gastrointestinal: No abdominal pain.  No nausea, no vomiting.  No diarrhea.  No constipation. Genitourinary: Negative for dysuria. Musculoskeletal: Ankle  pain. Skin: Negative for rash. Neurological: Negative for headaches, focal weakness or numbness. Psychiatric:  Alcohol and substance abuse Endocrine:  Diabetes and hypertension Allergic/Immunilogical: Levaquin ____________________________________________   PHYSICAL EXAM:  VITAL SIGNS: ED Triage Vitals  Enc Vitals Group     BP 06/27/20 0635 137/82     Pulse Rate 06/27/20 0635 77     Resp 06/27/20 0635 18     Temp 06/27/20 0635 98.7 F (37.1 C)     Temp Source 06/27/20 0635 Oral     SpO2 06/27/20 0635 97 %     Weight 06/27/20 0636 159 lb (72.1 kg)     Height 06/27/20 0636 5\' 2"  (1.575 m)     Head Circumference --      Peak Flow --      Pain Score 06/27/20 0636 10     Pain Loc --      Pain Edu? --      Excl. in [place]? --    Constitutional: Alert and oriented. Well appearing and in no acute distress. Eyes: Conjunctivae are normal. PERRL. EOMI. Head: Atraumatic. Nose: No congestion/rhinnorhea. Mouth/Throat: Mucous membranes are moist.  Oropharynx non-erythematous. Neck:No cervical spine tenderness to palpation. Hematological/Lymphatic/Immunilogical: No cervical lymphadenopathy. Cardiovascular: Normal rate, regular rhythm. Grossly normal heart sounds.  Good peripheral circulation. Respiratory: Normal respiratory effort.  No retractions. Lungs CTAB. Gastrointestinal: Soft and nontender. No distention. No abdominal bruits. No CVA tenderness. Genitourinary: Deferred Musculoskeletal: Obvious edema mild deformity to the right ankle.Marland Kitchen Neurologic:  Normal speech and language. No gross focal neurologic deficits are appreciated. No gait instability. Skin:  Skin is warm, dry and intact. No rash noted. Psychiatric: Mood and affect are normal. Speech and behavior are normal.  ____________________________________________   LABS (all labs ordered are listed, but only abnormal results are displayed)  Labs Reviewed - No data to  display ____________________________________________  EKG   ____________________________________________  RADIOLOGY I, Sable Feil, personally viewed and evaluated these images (plain radiographs) as part of my medical decision making, as well as reviewing the written report by the radiologist.  ED MD interpretation: By malleolus fracture  Official radiology report(s): DG Tibia/Fibula Right  Result Date: 06/27/2020 CLINICAL DATA:  Leg injury EXAM: RIGHT TIBIA AND FIBULA - 2 VIEW COMPARISON:  None. FINDINGS: Distal fibular and medial malleolus fractures with lateral displacement causing subluxation of the ankle. Generalized osteopenia. Subcutaneous reticulation in the lower leg. IMPRESSION: Displaced distal fibular and medial malleolus fractures. Electronically Signed   By: Monte Fantasia M.D.   On: 06/27/2020 07:36    ____________________________________________   PROCEDURES  Procedure(s) performed (including Critical Care):  .Splint Application  Date/Time: 06/27/2020 8:36 AM Performed by: Brayton Caves, NT Authorized by: Sable Feil, PA-C   Consent:    Consent obtained:  Verbal   Consent given by:  Patient   Risks discussed:  Discoloration, numbness, pain and  swelling Pre-procedure details:    Sensation:  Normal Procedure details:    Laterality:  Right   Location:  Ankle   Ankle:  R ankle   Splint type:  Short leg and ankle stirrup   Supplies:  Ortho-Glass Post-procedure details:    Pain:  Unchanged   Sensation:  Normal   Patient tolerance of procedure:  Tolerated well, no immediate complications     ____________________________________________   INITIAL IMPRESSION / ASSESSMENT AND PLAN / ED COURSE  As part of my medical decision making, I reviewed the following data within the [place]         Patient presents with ankle pain edema secondary to fall.  Discussed x-ray findings with patient.  Discussed patient with on-call  orthopedic Dr. Harlow Mares.  Patient replaced in this splint and given crutches to assist with ambulation.  Patient will follow up with orthopedics.  Advised on the drowsy effects of pain medication.      ____________________________________________   FINAL CLINICAL IMPRESSION(S) / ED DIAGNOSES  Final diagnoses:  Closed bimalleolar fracture of right ankle, initial encounter     ED Discharge Orders         Ordered    oxyCODONE-acetaminophen (PERCOCET) 7.5-325 MG tablet  Every 6 hours PRN        06/27/20 0805          *Please note:  Michaela Johnson was evaluated in Emergency Department on 06/27/2020 for the symptoms described in the history of present illness. She was evaluated in the context of the global COVID-19 pandemic, which necessitated consideration that the patient might be at risk for infection with the SARS-CoV-2 virus that causes COVID-19. Institutional protocols and algorithms that pertain to the evaluation of patients at risk for COVID-19 are in a state of rapid change based on information released by regulatory bodies including the CDC and federal and state organizations. These policies and algorithms were followed during the patient's care in the ED.  Some ED evaluations and interventions may be delayed as a result of limited staffing during and the pandemic.*   Note:  This document was prepared using Dragon voice recognition software and may include unintentional dictation errors.    Sable Feil, PA-C 06/27/20 0811    Sable Feil, PA-C 06/27/20 5374    Naaman Plummer, MD 06/27/20 1034

## 2020-06-27 NOTE — Discharge Instructions (Addendum)
Wear splint and ambulate with crutches pending further evaluation and treatment by orthopedics.  Call the clinic listed on your discharge care instructions today and tell them you are a follow-up from the emergency room.  They will schedule you for an appointment.

## 2020-07-03 ENCOUNTER — Other Ambulatory Visit: Payer: Self-pay

## 2020-07-03 ENCOUNTER — Other Ambulatory Visit: Payer: Self-pay | Admitting: Orthopedic Surgery

## 2020-07-03 ENCOUNTER — Other Ambulatory Visit
Admission: RE | Admit: 2020-07-03 | Discharge: 2020-07-03 | Disposition: A | Discharge status: Home / Self Care | Payer: HRSA Program | Source: Ambulatory Visit | Attending: Orthopedic Surgery | Admitting: Orthopedic Surgery

## 2020-07-03 ENCOUNTER — Encounter: Payer: Self-pay | Admitting: Orthopedic Surgery

## 2020-07-03 DIAGNOSIS — Z20822 Contact with and (suspected) exposure to covid-19: Secondary | ICD-10-CM | POA: Diagnosis not present

## 2020-07-03 DIAGNOSIS — Z01812 Encounter for preprocedural laboratory examination: Secondary | ICD-10-CM | POA: Diagnosis present

## 2020-07-03 LAB — SARS CORONAVIRUS 2 (TAT 6-24 HRS): SARS Coronavirus 2: NEGATIVE

## 2020-07-03 NOTE — H&P (Signed)
NAME: Michaela Johnson MRN:   [id number] DOB:   [birth year]     HISTORY AND PHYSICAL  CHIEF COMPLAINT:  Right ankle pain  HISTORY:   Michaela Johnson a 59 y.o. female  with right  Ankle Pain Patient complains of right ankle pain. Onset of the symptoms was several weeks ago. Inciting event: injured while walking. Current symptoms include: bruising and inability to bear weight. Aggravating factors: direct pressure, going up and down stairs and standing. Symptoms have gradually worsened. Patient has had no prior ankle problems. Previous visits for this problem: yes, last seen several days ago by ER.  Evaluation to date: plain films: abnormal trimalleolar fracture.  Treatment to date: avoidance of offending activity, ice and splint, crutches.   Plan for right knee ORIF  PAST MEDICAL HISTORY:   Past Medical History:  Diagnosis Date  . Alcohol use   . Arrhythmia   . Depression   . Diabetes mellitus without complication ([place])   . GERD (gastroesophageal reflux disease)   . Hypertension   . Neuropathy   . PAF (paroxysmal atrial fibrillation) ([place])   . Tobacco use   . Vitamin D deficiency     PAST SURGICAL HISTORY:   Past Surgical History:  Procedure Laterality Date  . NO PAST SURGERIES      MEDICATIONS:  (Not in a hospital admission)   ALLERGIES:   Allergies  Allergen Reactions  . Levaquin [Levofloxacin In D5w] Itching    REVIEW OF SYSTEMS:   Negative except HPI  FAMILY HISTORY:   Family History  Problem Relation Age of Onset  . Diabetes Mother   . CAD Father   . Heart Problems Father   . Heart failure Father     SOCIAL HISTORY:   reports that she has been smoking. She has a 10.25 pack-year smoking history. She has never used smokeless tobacco. She reports previous alcohol use. She reports previous drug use. Drug: Marijuana.  PHYSICAL EXAM:  General appearance: alert, cooperative and no distress Neck: no JVD and supple, symmetrical, trachea midline Resp: clear to auscultation  bilaterally Cardio: regular rate and rhythm, S1, S2 normal, no murmur, click, rub or gallop GI: soft, non-tender; bowel sounds normal; no masses,  no organomegaly Extremities: swelling, bruising and obvious deformity in right ankle Pulses: 2+ and symmetric none    LABORATORY STUDIES: No results for input(s): WBC, HGB, HCT, PLT in the last 72 hours.  No results for input(s): NA, K, CL, CO2, GLUCOSE, BUN, CREATININE, CALCIUM in the last 72 hours.  STUDIES/RESULTS:  DG Tibia/Fibula Right  Result Date: 06/27/2020 CLINICAL DATA:  Leg injury EXAM: RIGHT TIBIA AND FIBULA - 2 VIEW COMPARISON:  None. FINDINGS: Distal fibular and medial malleolus fractures with lateral displacement causing subluxation of the ankle. Generalized osteopenia. Subcutaneous reticulation in the lower leg. IMPRESSION: Displaced distal fibular and medial malleolus fractures. Electronically Signed   By: Monte Fantasia M.D.   On: 06/27/2020 07:36    ASSESSMENT:  Right trimalleolar fracture2        Active Problems:   * No active hospital problems. *   PLAN: right ankle open reduction and internal fixation   Carlynn Spry 07/03/2020. 3:21 PM

## 2020-07-03 NOTE — H&P (Deleted)
  The note originally documented on this encounter has been moved the the encounter in which it belongs.  

## 2020-07-04 ENCOUNTER — Ambulatory Visit
Admission: RE | Admit: 2020-07-04 | Discharge: 2020-07-04 | Disposition: A | Discharge status: Home / Self Care | Payer: Self-pay | Attending: Orthopedic Surgery | Admitting: Orthopedic Surgery

## 2020-07-04 ENCOUNTER — Ambulatory Visit: Payer: Self-pay

## 2020-07-04 ENCOUNTER — Ambulatory Visit: Payer: Self-pay | Admitting: Anesthesiology

## 2020-07-04 ENCOUNTER — Other Ambulatory Visit: Payer: Self-pay

## 2020-07-04 ENCOUNTER — Encounter
Admission: RE | Disposition: A | Discharge status: Home / Self Care | Payer: Self-pay | Source: Home / Self Care | Attending: Orthopedic Surgery

## 2020-07-04 ENCOUNTER — Encounter: Payer: Self-pay | Admitting: Orthopedic Surgery

## 2020-07-04 DIAGNOSIS — F17200 Nicotine dependence, unspecified, uncomplicated: Secondary | ICD-10-CM | POA: Insufficient documentation

## 2020-07-04 DIAGNOSIS — Y9301 Activity, walking, marching and hiking: Secondary | ICD-10-CM | POA: Insufficient documentation

## 2020-07-04 DIAGNOSIS — Z881 Allergy status to other antibiotic agents status: Secondary | ICD-10-CM | POA: Insufficient documentation

## 2020-07-04 DIAGNOSIS — S82851A Displaced trimalleolar fracture of right lower leg, initial encounter for closed fracture: Secondary | ICD-10-CM | POA: Insufficient documentation

## 2020-07-04 HISTORY — PX: ORIF ANKLE FRACTURE: SHX5408

## 2020-07-04 LAB — CBC
HCT: 37.5 % (ref 36.0–46.0)
Hemoglobin: 12.8 g/dL (ref 12.0–15.0)
MCH: 33.7 pg (ref 26.0–34.0)
MCHC: 34.1 g/dL (ref 30.0–36.0)
MCV: 98.7 fL (ref 80.0–100.0)
Platelets: 412 10*3/uL — ABNORMAL HIGH (ref 150–400)
RBC: 3.8 MIL/uL — ABNORMAL LOW (ref 3.87–5.11)
RDW: 11.9 % (ref 11.5–15.5)
WBC: 8.5 10*3/uL (ref 4.0–10.5)
nRBC: 0 % (ref 0.0–0.2)

## 2020-07-04 LAB — URINALYSIS, ROUTINE W REFLEX MICROSCOPIC
Bilirubin Urine: NEGATIVE
Glucose, UA: >=500 mg/dL — AB
Ketones, ur: NEGATIVE mg/dL
Nitrite: POSITIVE — AB
Protein, ur: NEGATIVE mg/dL
Specific Gravity, Urine: 1.009 (ref 1.005–1.030)
WBC, UA: >50 WBC/hpf — ABNORMAL HIGH (ref 0–5)
pH: 5 (ref 5.0–8.0)

## 2020-07-04 LAB — BASIC METABOLIC PANEL
Anion gap: 8 (ref 5–15)
BUN: 20 mg/dL (ref 8–23)
CO2: 28 mmol/L (ref 22–32)
Calcium: 9.2 mg/dL (ref 8.9–10.3)
Chloride: 93 mmol/L — ABNORMAL LOW (ref 98–111)
Creatinine, Ser: 0.89 mg/dL (ref 0.44–1.00)
GFR, Estimated: >60 mL/min (ref >60)
Glucose, Bld: 274 mg/dL — ABNORMAL HIGH (ref 70–99)
Potassium: 4.4 mmol/L (ref 3.5–5.1)
Sodium: 129 mmol/L — ABNORMAL LOW (ref 135–145)

## 2020-07-04 LAB — URINE DRUG SCREEN, QUALITATIVE (ARMC ONLY)
Amphetamines, Ur Screen: NOT DETECTED
Barbiturates, Ur Screen: NOT DETECTED
Benzodiazepine, Ur Scrn: NOT DETECTED
Cannabinoid 50 Ng, Ur ~~LOC~~: POSITIVE — AB
Cocaine Metabolite,Ur ~~LOC~~: NOT DETECTED
MDMA (Ecstasy)Ur Screen: NOT DETECTED
Methadone Scn, Ur: NOT DETECTED
Opiate, Ur Screen: NOT DETECTED
Phencyclidine (PCP) Ur S: NOT DETECTED
Tricyclic, Ur Screen: NOT DETECTED

## 2020-07-04 LAB — GLUCOSE, CAPILLARY: Glucose-Capillary: 164 mg/dL — ABNORMAL HIGH (ref 70–99)

## 2020-07-04 SURGERY — OPEN REDUCTION INTERNAL FIXATION (ORIF) ANKLE FRACTURE
Anesthesia: General | Site: Ankle | Laterality: Right

## 2020-07-04 MED ORDER — FENTANYL CITRATE (PF) 100 MCG/2ML IJ SOLN
INTRAMUSCULAR | Status: AC
  Administered 2020-07-04: 16:00:00 25 ug via INTRAVENOUS
  Filled 2020-07-04: qty 2

## 2020-07-04 MED ORDER — LACTATED RINGERS IV SOLN: INTRAVENOUS | Status: DC

## 2020-07-04 MED ORDER — MIDAZOLAM HCL 2 MG/2ML IJ SOLN
INTRAMUSCULAR | Status: AC
  Filled 2020-07-04: qty 2

## 2020-07-04 MED ORDER — CEFAZOLIN SODIUM-DEXTROSE 2-4 GM/100ML-% IV SOLN
2.0000 g | INTRAVENOUS | Status: AC
  Administered 2020-07-04: 13:00:00 2 g via INTRAVENOUS

## 2020-07-04 MED ORDER — SODIUM CHLORIDE 0.9 % IV SOLN: INTRAVENOUS | Status: DC

## 2020-07-04 MED ORDER — FENTANYL CITRATE (PF) 100 MCG/2ML IJ SOLN
25.0000 ug | INTRAMUSCULAR | Status: AC | PRN
Start: 2020-07-04 — End: 2020-07-04
  Administered 2020-07-04 (×6): 25 ug via INTRAVENOUS

## 2020-07-04 MED ORDER — IPRATROPIUM-ALBUTEROL 0.5-2.5 (3) MG/3ML IN SOLN: 3.0000 mL | Freq: Once | RESPIRATORY_TRACT | Status: AC

## 2020-07-04 MED ORDER — SUCCINYLCHOLINE CHLORIDE 20 MG/ML IJ SOLN
INTRAMUSCULAR | Status: DC | PRN
  Administered 2020-07-04: 100 mg via INTRAVENOUS

## 2020-07-04 MED ORDER — DEXAMETHASONE SODIUM PHOSPHATE 10 MG/ML IJ SOLN
INTRAMUSCULAR | Status: DC | PRN
  Administered 2020-07-04: 5 mg via INTRAVENOUS

## 2020-07-04 MED ORDER — CHLORHEXIDINE GLUCONATE 0.12 % MT SOLN
15.0000 mL | Freq: Once | OROMUCOSAL | Status: AC
  Administered 2020-07-04: 12:00:00 15 mL via OROMUCOSAL

## 2020-07-04 MED ORDER — PROPOFOL 10 MG/ML IV BOLUS
INTRAVENOUS | Status: DC | PRN
  Administered 2020-07-04: 150 mg via INTRAVENOUS

## 2020-07-04 MED ORDER — LIDOCAINE HCL (CARDIAC) PF 100 MG/5ML IV SOSY
PREFILLED_SYRINGE | INTRAVENOUS | Status: DC | PRN
  Administered 2020-07-04: 100 mg via INTRAVENOUS

## 2020-07-04 MED ORDER — PHENYLEPHRINE HCL (PRESSORS) 10 MG/ML IV SOLN
INTRAVENOUS | Status: DC | PRN
  Administered 2020-07-04: 100 ug via INTRAVENOUS
  Administered 2020-07-04: 200 ug via INTRAVENOUS
  Administered 2020-07-04 (×3): 100 ug via INTRAVENOUS

## 2020-07-04 MED ORDER — ORAL CARE MOUTH RINSE: 15.0000 mL | Freq: Once | OROMUCOSAL | Status: AC

## 2020-07-04 MED ORDER — CEFAZOLIN SODIUM-DEXTROSE 2-4 GM/100ML-% IV SOLN
INTRAVENOUS | Status: AC
  Filled 2020-07-04: qty 100

## 2020-07-04 MED ORDER — ACETAMINOPHEN 10 MG/ML IV SOLN
INTRAVENOUS | Status: DC | PRN
  Administered 2020-07-04: 1000 mg via INTRAVENOUS

## 2020-07-04 MED ORDER — LIDOCAINE HCL (PF) 2 % IJ SOLN
INTRAMUSCULAR | Status: AC
  Filled 2020-07-04: qty 5

## 2020-07-04 MED ORDER — ONDANSETRON HCL 4 MG/2ML IJ SOLN
INTRAMUSCULAR | Status: AC
  Filled 2020-07-04: qty 2

## 2020-07-04 MED ORDER — DEXAMETHASONE SODIUM PHOSPHATE 10 MG/ML IJ SOLN
INTRAMUSCULAR | Status: AC
  Filled 2020-07-04: qty 1

## 2020-07-04 MED ORDER — HYDROCODONE-ACETAMINOPHEN 5-325 MG PO TABS
1.0000 | ORAL_TABLET | ORAL | Status: DC | PRN
Start: 2020-07-04 — End: 2020-07-04

## 2020-07-04 MED ORDER — CHLORHEXIDINE GLUCONATE 0.12 % MT SOLN
OROMUCOSAL | Status: AC
  Filled 2020-07-04: qty 15

## 2020-07-04 MED ORDER — KETOROLAC TROMETHAMINE 15 MG/ML IJ SOLN: 15.0000 mg | Freq: Four times a day (QID) | INTRAMUSCULAR | Status: DC

## 2020-07-04 MED ORDER — ACETAMINOPHEN 10 MG/ML IV SOLN
INTRAVENOUS | Status: AC
  Filled 2020-07-04: qty 100

## 2020-07-04 MED ORDER — FENTANYL CITRATE (PF) 100 MCG/2ML IJ SOLN
INTRAMUSCULAR | Status: AC
  Filled 2020-07-04: qty 2

## 2020-07-04 MED ORDER — HYDROCODONE-ACETAMINOPHEN 5-325 MG PO TABS
ORAL_TABLET | ORAL | Status: AC
  Administered 2020-07-04: 17:00:00 2 via ORAL
  Filled 2020-07-04: qty 2

## 2020-07-04 MED ORDER — ONDANSETRON HCL 4 MG/2ML IJ SOLN: 4.0000 mg | Freq: Once | INTRAMUSCULAR | Status: DC | PRN

## 2020-07-04 MED ORDER — ROCURONIUM BROMIDE 10 MG/ML (PF) SYRINGE
PREFILLED_SYRINGE | INTRAVENOUS | Status: AC
  Filled 2020-07-04: qty 10

## 2020-07-04 MED ORDER — ONDANSETRON HCL 4 MG/2ML IJ SOLN
INTRAMUSCULAR | Status: DC | PRN
  Administered 2020-07-04: 4 mg via INTRAVENOUS

## 2020-07-04 MED ORDER — KETOROLAC TROMETHAMINE 15 MG/ML IJ SOLN
INTRAMUSCULAR | Status: AC
  Filled 2020-07-04: qty 1

## 2020-07-04 MED ORDER — SUCCINYLCHOLINE CHLORIDE 200 MG/10ML IV SOSY
PREFILLED_SYRINGE | INTRAVENOUS | Status: AC
  Filled 2020-07-04: qty 10

## 2020-07-04 MED ORDER — PROPOFOL 500 MG/50ML IV EMUL
INTRAVENOUS | Status: AC
  Filled 2020-07-04: qty 50

## 2020-07-04 MED ORDER — FENTANYL CITRATE (PF) 100 MCG/2ML IJ SOLN
INTRAMUSCULAR | Status: DC | PRN
  Administered 2020-07-04 (×2): 50 ug via INTRAVENOUS

## 2020-07-04 MED ORDER — MIDAZOLAM HCL 2 MG/2ML IJ SOLN
INTRAMUSCULAR | Status: DC | PRN
  Administered 2020-07-04: 2 mg via INTRAVENOUS

## 2020-07-04 MED ORDER — SEVOFLURANE IN SOLN
RESPIRATORY_TRACT | Status: AC
  Filled 2020-07-04: qty 250

## 2020-07-04 MED ORDER — IPRATROPIUM-ALBUTEROL 0.5-2.5 (3) MG/3ML IN SOLN
RESPIRATORY_TRACT | Status: AC
  Administered 2020-07-04: 16:00:00 3 mL via RESPIRATORY_TRACT
  Filled 2020-07-04: qty 3

## 2020-07-04 MED ORDER — OXYCODONE-ACETAMINOPHEN 10-325 MG PO TABS: 1.0000 | ORAL_TABLET | ORAL | 0 refills | Status: DC | PRN

## 2020-07-04 MED ORDER — SUGAMMADEX SODIUM 200 MG/2ML IV SOLN
INTRAVENOUS | Status: DC | PRN
  Administered 2020-07-04: 150 mg via INTRAVENOUS

## 2020-07-04 MED ORDER — ROCURONIUM BROMIDE 100 MG/10ML IV SOLN
INTRAVENOUS | Status: DC | PRN
  Administered 2020-07-04: 5 mg via INTRAVENOUS
  Administered 2020-07-04: 10 mg via INTRAVENOUS
  Administered 2020-07-04: 45 mg via INTRAVENOUS
  Administered 2020-07-04: 10 mg via INTRAVENOUS

## 2020-07-04 SURGICAL SUPPLY — 61 items
APL PRP STRL LF DISP 70% ISPRP (MISCELLANEOUS) ×2
BIT DRILL 2.5 X LONG (BIT) ×1
BIT DRILL CANN 2.7X625 NONSTRL (BIT) ×2 IMPLANT
BIT DRILL LCP QC 2X140 (BIT) ×2 IMPLANT
BIT DRILL X LONG 2.5 (BIT) IMPLANT
BNDG COHESIVE 6X5 TAN STRL LF (GAUZE/BANDAGES/DRESSINGS) ×3 IMPLANT
BNDG ELASTIC 6X5.8 VLCR STR LF (GAUZE/BANDAGES/DRESSINGS) ×3 IMPLANT
BNDG ESMARK 6X12 TAN STRL LF (GAUZE/BANDAGES/DRESSINGS) ×3 IMPLANT
BRUSH SCRUB EZ  4% CHG (MISCELLANEOUS) ×6
BRUSH SCRUB EZ 4% CHG (MISCELLANEOUS) ×2 IMPLANT
CANISTER SUCT 1200ML W/VALVE (MISCELLANEOUS) ×3 IMPLANT
CHLORAPREP W/TINT 26 (MISCELLANEOUS) ×6 IMPLANT
COVER WAND RF STERILE (DRAPES) ×3 IMPLANT
CUFF TOURN SGL QUICK 24 (TOURNIQUET CUFF)
CUFF TOURN SGL QUICK 30 (TOURNIQUET CUFF)
CUFF TRNQT CYL 24X4X16.5-23 (TOURNIQUET CUFF) IMPLANT
CUFF TRNQT CYL 30X4X21-28X (TOURNIQUET CUFF) IMPLANT
DRAPE 3/4 80X56 (DRAPES) ×3 IMPLANT
DRAPE FLUOR MINI C-ARM 54X84 (DRAPES) ×3 IMPLANT
DRILL BIT X LONG 2.5 (BIT) ×3
ELECT REM PT RETURN 9FT ADLT (ELECTROSURGICAL) ×3
ELECTRODE REM PT RTRN 9FT ADLT (ELECTROSURGICAL) ×1 IMPLANT
GAUZE XEROFORM 1X8 LF (GAUZE/BANDAGES/DRESSINGS) ×6 IMPLANT
GLOVE INDICATOR 8.0 STRL GRN (GLOVE) ×9 IMPLANT
GLOVE SURG ORTHO 8.0 STRL STRW (GLOVE) ×9 IMPLANT
GOWN STRL REUS W/ TWL LRG LVL3 (GOWN DISPOSABLE) ×1 IMPLANT
GOWN STRL REUS W/ TWL XL LVL3 (GOWN DISPOSABLE) ×1 IMPLANT
GOWN STRL REUS W/TWL LRG LVL3 (GOWN DISPOSABLE) ×3
GOWN STRL REUS W/TWL XL LVL3 (GOWN DISPOSABLE) ×3
GUIDEWARE NON THREAD 1.25X150 (WIRE) ×6
GUIDEWIRE NON THREAD 1.25X150 (WIRE) IMPLANT
KIT TURNOVER KIT A (KITS) ×3 IMPLANT
MANIFOLD NEPTUNE II (INSTRUMENTS) ×3 IMPLANT
NS IRRIG 1000ML POUR BTL (IV SOLUTION) ×3 IMPLANT
PACK EXTREMITY ARMC (MISCELLANEOUS) ×3 IMPLANT
PAD ABD DERMACEA PRESS 5X9 (GAUZE/BANDAGES/DRESSINGS) ×12 IMPLANT
PAD CAST CTTN 4X4 STRL (SOFTGOODS) ×2 IMPLANT
PADDING CAST COTTON 4X4 STRL (SOFTGOODS) ×6
PLATE FIBULA 5 HOLE RIGHT (Plate) ×2 IMPLANT
SCALPEL PROTECTED #10 DISP (BLADE) ×3 IMPLANT
SCALPEL PROTECTED #15 DISP (BLADE) ×3 IMPLANT
SCREW 3.5X44MM (Screw) ×2 IMPLANT
SCREW CANN S THRD/44 4.0 (Screw) ×4 IMPLANT
SCREW CORT LP ST 3.5X16 (Screw) ×2 IMPLANT
SCREW CORT ST T8 SD REC 2.7X20 (Screw) ×2 IMPLANT
SCREW CORTEX LOW PRO 3.5X32 (Screw) ×2 IMPLANT
SCREW LOCK T8 22X2.7XST VA (Screw) IMPLANT
SCREW LOCK VA ST 2.7X14 (Screw) ×4 IMPLANT
SCREW LOCK VA ST 2.7X20 (Screw) ×2 IMPLANT
SCREW LOCKING 2.7X22MM (Screw) ×3 IMPLANT
SCREW SELF TAP 14MM (Screw) ×2 IMPLANT
SPLINT CAST 1 STEP 5X30 WHT (MISCELLANEOUS) ×3 IMPLANT
SPONGE LAP 18X18 RF (DISPOSABLE) ×3 IMPLANT
STAPLER SKIN PROX 35W (STAPLE) ×3 IMPLANT
SUT VIC AB 2-0 CT2 27 (SUTURE) ×3 IMPLANT
SUT VIC AB 3-0 SH 27 (SUTURE) ×3
SUT VIC AB 3-0 SH 27X BRD (SUTURE) ×1 IMPLANT
TAPE SURG TRANSPORE 1 IN (GAUZE/BANDAGES/DRESSINGS) ×1 IMPLANT
TAPE SURGICAL TRANSPORE 1 IN (GAUZE/BANDAGES/DRESSINGS) ×3
TOWEL OR 17X26 4PK STRL BLUE (TOWEL DISPOSABLE) ×6 IMPLANT
WASHER 7MM DIA (Washer) ×4 IMPLANT

## 2020-07-04 NOTE — Op Note (Signed)
  07/04/2020  3:41 PM  PATIENT:  Michaela Johnson  59 y.o. female  PRE-OPERATIVE DIAGNOSIS:  S82.891A Oth fracture of right lower leg, init for clos fx  POST-OPERATIVE DIAGNOSIS:  Same  PROCEDURE:  OPEN REDUCTION INTERNAL FIXATION (ORIF) ANKLE FRACTURE  SURGEON:  Kurtis Bushman, MD  ASST:  Carlynn Spry, PA-C  ANESTHESIA:   General  EBL:  50 ml  TOURNIQUET TIME:  90 min at 250 mmHg  OPERATIVE FINDINGS:  Unstable, closed ankle fracture  OPERATIVE PROCEDURE:   The patient was brought to the operating room and placed in the supine position. All bony prominences were padded. General anesthesia was administered. The lower extremity was prepped and draped in the usual sterile fashion. The leg was elevated and exsanguinated and the tourniquet was inflated. Time out was performed.   Incision was made over the distal fibula and the fracture was exposed and reduced anatomically with a clamp. A lag screw was placed. I then applied a synthes lateral locking plate and secured it proximally with cortical screws and distally with locking screws.  I used the Fluoroscan to confirm satisfactory reduction and fixation. The bone quality was poor. This wound was irrigated and closed with vicryl sutures and staples.  I then turned my attention to the medial malleolus. Incision was made over the medial malleolus and the fracture exposed and held provisionally with a clamp. 2 guidepins were placed for the 4.0 mm cannulated screws with washers and then confirmation of reduction was made with fluoroscopy. I then placed 2  56mm screws which had satisfactory fixation. Fluoroscopy showed good reduction and hardware placement. Again bone quality was poor.   The syndesmosis was stressed using live fluoroscopy and found to be unstable. The two oblong holes were used to place 2 3.5 mm x 40 mm screws with excellent fixation and reduction of the syndesmosis.  The posterior lip fracture reduced with reduction of the medial  and lateral sides.  The medial wound was irrigated, and closed with vicryl and staples. Sponge and needle counts were correct. Sterile gauze was applied followed by a posterior splint. She was awakened and returned to the PACU in stable and satisfactory condition. There were no apparent complications.  Kurtis Bushman, MD

## 2020-07-04 NOTE — H&P (Signed)
The patient has been re-examined, and the chart reviewed, and there have been no interval changes to the documented history and physical.  Plan a right ankle open reduction and internal fixation today.  Anesthesia is consulted regarding a peripheral nerve block for post-operative pain.  The risks, benefits, and alternatives have been discussed at length, and the patient is willing to proceed.     

## 2020-07-04 NOTE — Transfer of Care (Signed)
Immediate Anesthesia Transfer of Care Note  Patient: Michaela Johnson  Procedure(s) Performed: OPEN REDUCTION INTERNAL FIXATION (ORIF) ANKLE FRACTURE (Right Ankle)  Patient Location: PACU  Anesthesia Type:General  Level of Consciousness: awake, alert  and oriented  Airway & Oxygen Therapy: Patient connected to face mask oxygen  Post-op Assessment: Post -op Vital signs reviewed and stable  Post vital signs: stable  Last Vitals:  Vitals Value Taken Time  BP 128/72   Temp    Pulse 77 07/04/20 1543  Resp 13 07/04/20 1543  SpO2 99 % 07/04/20 1543  Vitals shown include unvalidated device data.  Last Pain:  Vitals:   07/04/20 1150  TempSrc: Oral  PainSc: 9          Complications: No complications documented.

## 2020-07-04 NOTE — Anesthesia Postprocedure Evaluation (Signed)
Anesthesia Post Note  Patient: Michaela Johnson  Procedure(s) Performed: OPEN REDUCTION INTERNAL FIXATION (ORIF) ANKLE FRACTURE (Right Ankle)  Patient location during evaluation: PACU Anesthesia Type: General Level of consciousness: awake and alert Pain management: pain level controlled Vital Signs Assessment: post-procedure vital signs reviewed and stable Respiratory status: spontaneous breathing, nonlabored ventilation, respiratory function stable and patient connected to nasal cannula oxygen Cardiovascular status: blood pressure returned to baseline and stable Postop Assessment: no apparent nausea or vomiting Anesthetic complications: no   No complications documented.   Last Vitals:  Vitals:   07/04/20 1710 07/04/20 1739  BP: (!) 131/57 129/62  Pulse: 87 88  Resp: 16 16  Temp: 36.6 C   SpO2: 95% 98%    Last Pain:  Vitals:   07/04/20 1739  TempSrc:   PainSc: 4                  Precious Haws Kiandria Clum

## 2020-07-04 NOTE — Anesthesia Procedure Notes (Signed)
Procedure Name: LMA Insertion Date/Time: 07/04/2020 1:00 PM Performed by: Aline Brochure, CRNA Pre-anesthesia Checklist: Patient identified, Emergency Drugs available, Suction available and Patient being monitored Patient Re-evaluated:Patient Re-evaluated prior to induction Oxygen Delivery Method: Circle system utilized Preoxygenation: Pre-oxygenation with 100% oxygen Induction Type: IV induction Ventilation: Mask ventilation without difficulty LMA: LMA inserted LMA Size: 3.5 Number of attempts: 1 Placement Confirmation: positive ETCO2 and breath sounds checked- equal and bilateral Tube secured with: Tape Dental Injury: Teeth and Oropharynx as per pre-operative assessment

## 2020-07-04 NOTE — Anesthesia Procedure Notes (Addendum)
Procedure Name: Intubation Date/Time: 07/04/2020 1:09 PM Performed by: Aline Brochure, CRNA Pre-anesthesia Checklist: Patient identified, Emergency Drugs available, Suction available and Patient being monitored Patient Re-evaluated:Patient Re-evaluated prior to induction Oxygen Delivery Method: Circle system utilized Preoxygenation: Pre-oxygenation with 100% oxygen Induction Type: Inhalational induction with existing ETT Ventilation: Mask ventilation without difficulty Laryngoscope Size: Mac and 3 Grade View: Grade I Number of attempts: 1 Airway Equipment and Method: Stylet Placement Confirmation: ETT inserted through vocal cords under direct vision,  positive ETCO2 and breath sounds checked- equal and bilateral Secured at: 21 cm Tube secured with: Tape Dental Injury: Teeth and Oropharynx as per pre-operative assessment

## 2020-07-04 NOTE — Discharge Instructions (Signed)

## 2020-07-04 NOTE — Anesthesia Preprocedure Evaluation (Addendum)
Anesthesia Evaluation  Patient identified by MRN, date of birth, ID band Patient awake    Reviewed: Allergy & Precautions, NPO status , Patient's Chart, lab work & pertinent test results  History of Anesthesia Complications Negative for: history of anesthetic complications  Airway Mallampati: II       Dental   Pulmonary neg sleep apnea, neg COPD, Current Smoker,           Cardiovascular hypertension, Pt. on medications (-) Past MI and (-) CHF (-) dysrhythmias (-) Valvular Problems/Murmurs     Neuro/Psych neg Seizures Depression    GI/Hepatic Neg liver ROS, GERD  Medicated and Controlled,  Endo/Other  diabetes, Type 2, Insulin Dependent  Renal/GU negative Renal ROS     Musculoskeletal   Abdominal   Peds  Hematology   Anesthesia Other Findings   Reproductive/Obstetrics                            Anesthesia Physical Anesthesia Plan  ASA: III  Anesthesia Plan: General   Post-op Pain Management:    Induction: Intravenous  PONV Risk Score and Plan: 2  Airway Management Planned: LMA  Additional Equipment:   Intra-op Plan:   Post-operative Plan:   Informed Consent: I have reviewed the patients History and Physical, chart, labs and discussed the procedure including the risks, benefits and alternatives for the proposed anesthesia with the patient or authorized representative who has indicated his/her understanding and acceptance.       Plan Discussed with:   Anesthesia Plan Comments:         Anesthesia Quick Evaluation

## 2020-07-05 ENCOUNTER — Encounter: Payer: Self-pay | Admitting: Orthopedic Surgery

## 2020-07-27 ENCOUNTER — Encounter (HOSPITAL_COMMUNITY): Payer: Self-pay | Admitting: Orthopedic Surgery

## 2020-07-27 ENCOUNTER — Other Ambulatory Visit: Payer: Self-pay

## 2020-07-27 NOTE — Progress Notes (Addendum)
Ms Folden denies chest pain or shortness of breath. Patient also denies any s/s of Covid or been in contact with anyone who does.  Ms Simerson does not have a ride to have Covid test today, she will be tested on arrival.  Ms Daddario has diabetes, patient is not sure what type of diabetic she is, she has heard that she is a Type I , but some say type II. The notes I have read in [facility] list patient as a Type II.  Patient reports that CBG runs 140- 200, an ocassional 500 and an ocassional 40. I told Ms. Akre to take 23 units of Lantus on Monday if CBG is greater than 70. I instructed a[ptient if CBG is less than 70 , to drink 1/2 a cup of apple or cranberry juice, Patient said  "orange, " I said no, not if you want your surgery to start on time.  Ms Spillers takes Eliquis for Afib, patient reported that no one gave her instructations to hold it.I told Ms Arel I would call Dr. Carlean Jews office and ask some one to call her. I spoke to [place], who spoke to [place], [place] , "he said to not take it Sunday or Monday, I asked Ria Comment to have someone from th office call and inform Ms. Bobbye [place].  I reminded Ms Moulin that she is not to smoke 24 hours prior to surgery and to drink less Sunday.

## 2020-07-29 NOTE — Anesthesia Preprocedure Evaluation (Addendum)
Anesthesia Evaluation  Patient identified by MRN, date of birth, ID band Patient awake    Reviewed: Allergy & Precautions, NPO status , Patient's Chart, lab work & pertinent test results  Airway Mallampati: II  TM Distance: >3 FB Neck ROM: Full    Dental  (+) Dental Advisory Given, Edentulous Upper, Edentulous Lower   Pulmonary Current SmokerPatient did not abstain from smoking.,    Pulmonary exam normal breath sounds clear to auscultation       Cardiovascular hypertension, Pt. on medications Normal cardiovascular exam+ dysrhythmias Atrial Fibrillation  Rhythm:Regular Rate:Normal     Neuro/Psych Seizures -,  PSYCHIATRIC DISORDERS Anxiety Depression    GI/Hepatic GERD  ,(+)     substance abuse  alcohol use,   Endo/Other  diabetes, Type 2, Insulin Dependent  Renal/GU negative Renal ROS     Musculoskeletal  (+) Arthritis , RIGHT ANKLE FRACTURE DISLOCATION   Abdominal   Peds  Hematology  (+) Blood dyscrasia (Xarelto), ,   Anesthesia Other Findings   Reproductive/Obstetrics                           Anesthesia Physical Anesthesia Plan  ASA: III  Anesthesia Plan: General   Post-op Pain Management:  Regional for Post-op pain   Induction:   PONV Risk Score and Plan: 2 and Midazolam, Dexamethasone and Ondansetron  Airway Management Planned: LMA  Additional Equipment:   Intra-op Plan:   Post-operative Plan: Extubation in OR  Informed Consent: I have reviewed the patients History and Physical, chart, labs and discussed the procedure including the risks, benefits and alternatives for the proposed anesthesia with the patient or authorized representative who has indicated his/her understanding and acceptance.     Dental advisory given  Plan Discussed with: CRNA  Anesthesia Plan Comments:        Anesthesia Quick Evaluation

## 2020-07-29 NOTE — H&P (Signed)
Orthopaedic Trauma Service (OTS) Consult   Patient ID: Michaela Johnson MRN: [id number] DOB/AGE: Nov 21, 1960 60 y.o.    HPI: Michaela Johnson is an 60 y.o. female who sustained a ground  Level fall about 4 weeks ago. Sustained a R ankle fracture dislocation. Taken to OR about 2 weeks ago at [facility]. At first postop follow up loss of reduction noted and pt referred to OTS. Pt insulin dependent diabetic and smokes about 1 ppd. 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 Pt was noncompliant with NWB instructions. She presents today for repair of her persistent R ankle fracture dislocation   History of PAF on xarelto  Baseline peripheral neuropathy B LEx     Past Medical History:  Diagnosis Date  . Alcohol use   . Anxiety   . Arrhythmia   . Arthritis   . Constipation   . Depression   . Diabetes mellitus without complication ([place])   . Dysrhythmia     PAF  . GERD (gastroesophageal reflux disease)   . Hypertension   . Neuropathy   . PAF (paroxysmal atrial fibrillation) ([place])   . Panic attacks   . Pneumonia   . Tobacco use   . Vitamin D deficiency     Past Surgical History:  Procedure Laterality Date  . ORIF ANKLE FRACTURE Right 07/04/2020   Procedure: OPEN REDUCTION INTERNAL FIXATION (ORIF) ANKLE FRACTURE;  Surgeon: Lovell Sheehan, MD;  Location: [facility] ORS;  Service: Orthopedics;  Laterality: Right;    Family History  Problem Relation Age of Onset  . Diabetes Mother   . CAD Father   . Heart Problems Father   . Heart failure Father     Social History:  reports that she has been smoking. She has a 41.00 pack-year smoking history. She has never used smokeless tobacco. She reports current alcohol use of about 6.0 standard drinks of alcohol per week. She reports current drug use. Frequency: 7.00 times per week. Drug: Marijuana.  Allergies:  Allergies  Allergen Reactions  . Codeine Itching  . Levaquin [Levofloxacin In D5w] Itching    Medications: I have  reviewed the patient's current medications. Current Meds  Medication Sig  . diltiazem (CARDIZEM CD) 240 MG 24 hr capsule Take 1 capsule (240 mg total) by mouth daily.  . diphenhydrAMINE (BENADRYL) 12.5 MG/5ML liquid Take 25 mg by mouth 4 (four) times daily as needed for itching.  Marland Kitchen FLUoxetine (PROZAC) 40 MG capsule Take 40 mg by mouth daily.  Marland Kitchen gabapentin (NEURONTIN) 300 MG capsule Take 600 mg by mouth at bedtime as needed (Neuropathy).  . insulin aspart (NOVOLOG) 100 UNIT/ML injection Inject 4 Units into the skin 3 (three) times daily with meals.  . insulin glargine (LANTUS) 100 UNIT/ML injection Inject 46 Units into the skin in the morning.  Marland Kitchen lisinopril (ZESTRIL) 40 MG tablet Take 1 tablet (40 mg total) by mouth daily.  Marland Kitchen omeprazole (PRILOSEC) 20 MG capsule Take 20 mg by mouth daily.  Marland Kitchen oxyCODONE-acetaminophen (PERCOCET) 10-325 MG tablet Take 1 tablet by mouth every 4 (four) hours as needed for pain. (Patient taking differently: Take 1 tablet by mouth every 6 (six) hours as needed for pain.)  . rivaroxaban (XARELTO) 20 MG TABS tablet Take 1 tablet (20 mg total) by mouth daily with supper. (Patient taking differently: Take 20 mg by mouth daily.)     No results found for this or any previous visit (from the past 48 hour(s)).  No results found.  Intake/Output  None      Review of Systems  Constitutional: Negative for chills and fever.  HENT: Negative for congestion and sinus pain.   Eyes: Negative for double vision.  Respiratory: Negative for cough.   Cardiovascular: Negative for chest pain and palpitations.  Gastrointestinal: Negative for abdominal pain, constipation, nausea and vomiting.  Genitourinary: Negative for dysuria.  Neurological: Positive for tingling (baseline B LEx neuropathy ).  Psychiatric/Behavioral: Negative for depression.   Height 5\' 2"  (1.575 m), weight 68 kg. Physical Exam Constitutional:      General: She is not in acute distress.    Appearance: Normal  appearance. She is normal weight.  HENT:     Head: Normocephalic.     Mouth/Throat:     Mouth: Mucous membranes are moist.     Pharynx: Oropharynx is clear.  Eyes:     Extraocular Movements: Extraocular movements intact.  Cardiovascular:     Rate and Rhythm: Normal rate and regular rhythm.     Pulses: Normal pulses.     Heart sounds: No murmur heard.   Pulmonary:     Effort: Pulmonary effort is normal.  Abdominal:     General: Bowel sounds are normal. There is no distension.     Palpations: Abdomen is soft.  Musculoskeletal:     Cervical back: Normal range of motion.     Comments: Right Lower Extremity  SLC in place Bottom of cast is worn Ext warm  Brisk cap refill Knee nontender DPN, SPN, TN sensation grossly intactd EHL, FHL, lesser toe motor intact   Skin:    General: Skin is warm and dry.  Neurological:     General: No focal deficit present.     Mental Status: She is alert.  Psychiatric:        Attention and Perception: Attention and perception normal.        Mood and Affect: Mood and affect normal.     Assessment/Plan:  60 y/o female with R ankle fracture dislocation + syndesmotic disruption with loss of reduction   -R ankle fracture dislocation and syndesmotic disruption with loss of reduction about 3 weeks post op   OR for removal of hardware  Given total clinical picture we feel that tibiotalocalcaneal fusion with hindfoot fusion nail is the best option for pt   Risks and benefits reviewed with pt and she wishes to proceed  Still will be NWB x 6 weeks, may allow for some PWB in cast after first post op visit  Anticipate observation overnight   Will see if we can have SW/case management provide some guidance to help get pt help caring for sick husband    - Pain management:  Titrate accordingly   - ABL anemia/Hemodynamics  Monitor   - Medical issues   Nicotine dependence   IDDM   - DVT/PE prophylaxis:  Resume home anticoagulation post opo   -  ID:   periop abx  - Metabolic Bone Disease:  Check vitamin d levels and optimize  Fracture suggestive of osteoporosis    - FEN/GI prophylaxis/Foley/Lines:  Advance diet post op   - Impediments to fracture healing:  Poor bone quality   Nicotine dependence  IDDM   - Dispo:  OR as noted above     Jari Pigg, PA-C [phone] (C) 07/29/2020, 10:33 PM  [facility] [place] [phone] Jenetta [phone] (F)    After 5pm and on the weekends please log on to Amion, go to orthopaedics  and the look under the [facility] Call for the provider(s) on call. You can also call our office at [phone] and then follow the prompts to be connected to the call team.

## 2020-07-30 ENCOUNTER — Inpatient Hospital Stay (HOSPITAL_COMMUNITY): Payer: Self-pay | Admitting: Anesthesiology

## 2020-07-30 ENCOUNTER — Inpatient Hospital Stay (HOSPITAL_COMMUNITY)
Admission: RE | Admit: 2020-07-30 | Discharge: 2020-08-03 | DRG: 493 | Disposition: A | Discharge status: Home Health | Payer: Self-pay | Attending: Orthopedic Surgery | Admitting: Orthopedic Surgery

## 2020-07-30 ENCOUNTER — Other Ambulatory Visit: Payer: Self-pay

## 2020-07-30 ENCOUNTER — Encounter (HOSPITAL_COMMUNITY): Payer: Self-pay | Admitting: Orthopedic Surgery

## 2020-07-30 ENCOUNTER — Inpatient Hospital Stay (HOSPITAL_COMMUNITY): Payer: Self-pay

## 2020-07-30 ENCOUNTER — Encounter (HOSPITAL_COMMUNITY)
Admission: RE | Disposition: A | Discharge status: Home Health | Payer: Self-pay | Source: Home / Self Care | Attending: Orthopedic Surgery

## 2020-07-30 DIAGNOSIS — G629 Polyneuropathy, unspecified: Secondary | ICD-10-CM

## 2020-07-30 DIAGNOSIS — T148XXA Other injury of unspecified body region, initial encounter: Secondary | ICD-10-CM

## 2020-07-30 DIAGNOSIS — M1990 Unspecified osteoarthritis, unspecified site: Secondary | ICD-10-CM | POA: Diagnosis present

## 2020-07-30 DIAGNOSIS — Z8249 Family history of ischemic heart disease and other diseases of the circulatory system: Secondary | ICD-10-CM

## 2020-07-30 DIAGNOSIS — Z419 Encounter for procedure for purposes other than remedying health state, unspecified: Secondary | ICD-10-CM

## 2020-07-30 DIAGNOSIS — F32A Depression, unspecified: Secondary | ICD-10-CM | POA: Diagnosis present

## 2020-07-30 DIAGNOSIS — Z91148 Patient's other noncompliance with medication regimen for other reason: Secondary | ICD-10-CM

## 2020-07-30 DIAGNOSIS — S82841K Displaced bimalleolar fracture of right lower leg, subsequent encounter for closed fracture with nonunion: Principal | ICD-10-CM

## 2020-07-30 DIAGNOSIS — E1142 Type 2 diabetes mellitus with diabetic polyneuropathy: Secondary | ICD-10-CM | POA: Diagnosis present

## 2020-07-30 DIAGNOSIS — Z833 Family history of diabetes mellitus: Secondary | ICD-10-CM

## 2020-07-30 DIAGNOSIS — Z20822 Contact with and (suspected) exposure to covid-19: Secondary | ICD-10-CM | POA: Diagnosis present

## 2020-07-30 DIAGNOSIS — Z79899 Other long term (current) drug therapy: Secondary | ICD-10-CM

## 2020-07-30 DIAGNOSIS — M24471 Recurrent dislocation, right ankle: Secondary | ICD-10-CM | POA: Diagnosis present

## 2020-07-30 DIAGNOSIS — K5900 Constipation, unspecified: Secondary | ICD-10-CM | POA: Diagnosis present

## 2020-07-30 DIAGNOSIS — W1830XD Fall on same level, unspecified, subsequent encounter: Secondary | ICD-10-CM

## 2020-07-30 DIAGNOSIS — I10 Essential (primary) hypertension: Secondary | ICD-10-CM | POA: Diagnosis present

## 2020-07-30 DIAGNOSIS — Z9114 Patient's other noncompliance with medication regimen: Secondary | ICD-10-CM

## 2020-07-30 DIAGNOSIS — F17210 Nicotine dependence, cigarettes, uncomplicated: Secondary | ICD-10-CM | POA: Diagnosis present

## 2020-07-30 DIAGNOSIS — Z9119 Patient's noncompliance with other medical treatment and regimen: Secondary | ICD-10-CM

## 2020-07-30 DIAGNOSIS — Z7289 Other problems related to lifestyle: Secondary | ICD-10-CM

## 2020-07-30 DIAGNOSIS — Z7901 Long term (current) use of anticoagulants: Secondary | ICD-10-CM

## 2020-07-30 DIAGNOSIS — F17200 Nicotine dependence, unspecified, uncomplicated: Secondary | ICD-10-CM | POA: Diagnosis present

## 2020-07-30 DIAGNOSIS — F419 Anxiety disorder, unspecified: Secondary | ICD-10-CM | POA: Diagnosis present

## 2020-07-30 DIAGNOSIS — F1090 Alcohol use, unspecified, uncomplicated: Secondary | ICD-10-CM

## 2020-07-30 DIAGNOSIS — F410 Panic disorder [episodic paroxysmal anxiety] without agoraphobia: Secondary | ICD-10-CM | POA: Diagnosis present

## 2020-07-30 DIAGNOSIS — D62 Acute posthemorrhagic anemia: Secondary | ICD-10-CM | POA: Diagnosis present

## 2020-07-30 DIAGNOSIS — E559 Vitamin D deficiency, unspecified: Secondary | ICD-10-CM | POA: Diagnosis present

## 2020-07-30 DIAGNOSIS — Z794 Long term (current) use of insulin: Secondary | ICD-10-CM

## 2020-07-30 DIAGNOSIS — K219 Gastro-esophageal reflux disease without esophagitis: Secondary | ICD-10-CM | POA: Diagnosis present

## 2020-07-30 DIAGNOSIS — I480 Paroxysmal atrial fibrillation: Secondary | ICD-10-CM | POA: Diagnosis present

## 2020-07-30 DIAGNOSIS — E119 Type 2 diabetes mellitus without complications: Secondary | ICD-10-CM

## 2020-07-30 DIAGNOSIS — F1010 Alcohol abuse, uncomplicated: Secondary | ICD-10-CM | POA: Diagnosis present

## 2020-07-30 DIAGNOSIS — S82851K Displaced trimalleolar fracture of right lower leg, subsequent encounter for closed fracture with nonunion: Secondary | ICD-10-CM | POA: Diagnosis present

## 2020-07-30 HISTORY — DX: Panic disorder (episodic paroxysmal anxiety): F41.0

## 2020-07-30 HISTORY — DX: Cardiac arrhythmia, unspecified: I49.9

## 2020-07-30 HISTORY — DX: Unspecified osteoarthritis, unspecified site: M19.90

## 2020-07-30 HISTORY — PX: ANKLE FUSION: SHX5718

## 2020-07-30 HISTORY — DX: Displaced trimalleolar fracture of right lower leg, subsequent encounter for closed fracture with nonunion: S82.851K

## 2020-07-30 HISTORY — DX: Pneumonia, unspecified organism: J18.9

## 2020-07-30 HISTORY — PX: APPLICATION OF WOUND VAC: SHX5189

## 2020-07-30 HISTORY — DX: Anxiety disorder, unspecified: F41.9

## 2020-07-30 HISTORY — PX: HARDWARE REMOVAL: SHX979

## 2020-07-30 HISTORY — DX: Constipation, unspecified: K59.00

## 2020-07-30 LAB — COMPREHENSIVE METABOLIC PANEL
ALT: 14 U/L (ref 0–44)
ALT: 13 U/L (ref 0–44)
AST: 20 U/L (ref 15–41)
AST: 24 U/L (ref 15–41)
Albumin: 3.3 g/dL — ABNORMAL LOW (ref 3.5–5.0)
Albumin: 3 g/dL — ABNORMAL LOW (ref 3.5–5.0)
Alkaline Phosphatase: 108 U/L (ref 38–126)
Alkaline Phosphatase: 106 U/L (ref 38–126)
Anion gap: 12 (ref 5–15)
Anion gap: 14 (ref 5–15)
BUN: 9 mg/dL (ref 8–23)
BUN: 11 mg/dL (ref 8–23)
CO2: 26 mmol/L (ref 22–32)
CO2: 21 mmol/L — ABNORMAL LOW (ref 22–32)
Calcium: 9.3 mg/dL (ref 8.9–10.3)
Calcium: 8.5 mg/dL — ABNORMAL LOW (ref 8.9–10.3)
Chloride: 99 mmol/L (ref 98–111)
Chloride: 99 mmol/L (ref 98–111)
Creatinine, Ser: 0.67 mg/dL (ref 0.44–1.00)
Creatinine, Ser: 0.69 mg/dL (ref 0.44–1.00)
GFR, Estimated: >60 mL/min (ref >60)
GFR, Estimated: >60 mL/min (ref >60)
Glucose, Bld: 101 mg/dL — ABNORMAL HIGH (ref 70–99)
Glucose, Bld: 225 mg/dL — ABNORMAL HIGH (ref 70–99)
Potassium: 3.9 mmol/L (ref 3.5–5.1)
Potassium: 4.2 mmol/L (ref 3.5–5.1)
Sodium: 137 mmol/L (ref 135–145)
Sodium: 134 mmol/L — ABNORMAL LOW (ref 135–145)
Total Bilirubin: 0.6 mg/dL (ref 0.3–1.2)
Total Bilirubin: 0.3 mg/dL (ref 0.3–1.2)
Total Protein: 7.3 g/dL (ref 6.5–8.1)
Total Protein: 6.6 g/dL (ref 6.5–8.1)

## 2020-07-30 LAB — CBC WITH DIFFERENTIAL/PLATELET
Abs Immature Granulocytes: 0.02 10*3/uL (ref 0.00–0.07)
Basophils Absolute: 0.1 10*3/uL (ref 0.0–0.1)
Basophils Relative: 1 %
Eosinophils Absolute: 0.2 10*3/uL (ref 0.0–0.5)
Eosinophils Relative: 3 %
HCT: 40 % (ref 36.0–46.0)
Hemoglobin: 13.3 g/dL (ref 12.0–15.0)
Immature Granulocytes: 0 %
Lymphocytes Relative: 25 %
Lymphs Abs: 2 10*3/uL (ref 0.7–4.0)
MCH: 32.8 pg (ref 26.0–34.0)
MCHC: 33.3 g/dL (ref 30.0–36.0)
MCV: 98.8 fL (ref 80.0–100.0)
Monocytes Absolute: 0.6 10*3/uL (ref 0.1–1.0)
Monocytes Relative: 7 %
Neutro Abs: 5.1 10*3/uL (ref 1.7–7.7)
Neutrophils Relative %: 64 %
Platelets: 315 10*3/uL (ref 150–400)
RBC: 4.05 MIL/uL (ref 3.87–5.11)
RDW: 12.7 % (ref 11.5–15.5)
WBC: 7.9 10*3/uL (ref 4.0–10.5)
nRBC: 0 % (ref 0.0–0.2)

## 2020-07-30 LAB — URINALYSIS, ROUTINE W REFLEX MICROSCOPIC
Bilirubin Urine: NEGATIVE
Glucose, UA: NEGATIVE mg/dL
Ketones, ur: NEGATIVE mg/dL
Nitrite: NEGATIVE
Protein, ur: NEGATIVE mg/dL
Specific Gravity, Urine: 1.011 (ref 1.005–1.030)
WBC, UA: >50 WBC/hpf — ABNORMAL HIGH (ref 0–5)
pH: 5 (ref 5.0–8.0)

## 2020-07-30 LAB — SARS CORONAVIRUS 2 BY RT PCR (HOSPITAL ORDER, PERFORMED IN ~~LOC~~ HOSPITAL LAB): SARS Coronavirus 2: NEGATIVE

## 2020-07-30 LAB — GLUCOSE, CAPILLARY
Glucose-Capillary: 106 mg/dL — ABNORMAL HIGH (ref 70–99)
Glucose-Capillary: 166 mg/dL — ABNORMAL HIGH (ref 70–99)
Glucose-Capillary: 398 mg/dL — ABNORMAL HIGH (ref 70–99)
Glucose-Capillary: 350 mg/dL — ABNORMAL HIGH (ref 70–99)

## 2020-07-30 LAB — CBC
HCT: 35.4 % — ABNORMAL LOW (ref 36.0–46.0)
Hemoglobin: 11.5 g/dL — ABNORMAL LOW (ref 12.0–15.0)
MCH: 32.1 pg (ref 26.0–34.0)
MCHC: 32.5 g/dL (ref 30.0–36.0)
MCV: 98.9 fL (ref 80.0–100.0)
Platelets: 295 10*3/uL (ref 150–400)
RBC: 3.58 MIL/uL — ABNORMAL LOW (ref 3.87–5.11)
RDW: 12.6 % (ref 11.5–15.5)
WBC: 9.4 10*3/uL (ref 4.0–10.5)
nRBC: 0 % (ref 0.0–0.2)

## 2020-07-30 LAB — HEMOGLOBIN A1C
Hgb A1c MFr Bld: 7.7 % — ABNORMAL HIGH (ref 4.8–5.6)
Mean Plasma Glucose: 174.29 mg/dL

## 2020-07-30 LAB — PHOSPHORUS: Phosphorus: 4 mg/dL (ref 2.5–4.6)

## 2020-07-30 LAB — VITAMIN D 25 HYDROXY (VIT D DEFICIENCY, FRACTURES): Vit D, 25-Hydroxy: 35.18 ng/mL (ref 30–100)

## 2020-07-30 LAB — SURGICAL PCR SCREEN
MRSA, PCR: NEGATIVE
Staphylococcus aureus: POSITIVE — AB

## 2020-07-30 LAB — PROTIME-INR
INR: 1 (ref 0.8–1.2)
Prothrombin Time: 13 seconds (ref 11.4–15.2)

## 2020-07-30 LAB — MAGNESIUM: Magnesium: 1.7 mg/dL (ref 1.7–2.4)

## 2020-07-30 LAB — APTT: aPTT: 29 seconds (ref 24–36)

## 2020-07-30 SURGERY — REMOVAL, HARDWARE
Anesthesia: General | Site: Ankle | Laterality: Right

## 2020-07-30 MED ORDER — GABAPENTIN 300 MG PO CAPS: 600.0000 mg | ORAL_CAPSULE | Freq: Every evening | ORAL | Status: DC | PRN

## 2020-07-30 MED ORDER — METHOCARBAMOL 1000 MG/10ML IJ SOLN
500.0000 mg | Freq: Four times a day (QID) | INTRAVENOUS | Status: DC | PRN
  Filled 2020-07-30: qty 5

## 2020-07-30 MED ORDER — 0.9 % SODIUM CHLORIDE (POUR BTL) OPTIME
TOPICAL | Status: DC | PRN
  Administered 2020-07-30: 1000 mL

## 2020-07-30 MED ORDER — FENTANYL CITRATE (PF) 250 MCG/5ML IJ SOLN
INTRAMUSCULAR | Status: DC | PRN
  Administered 2020-07-30 (×2): 25 ug via INTRAVENOUS

## 2020-07-30 MED ORDER — FENTANYL CITRATE (PF) 100 MCG/2ML IJ SOLN
INTRAMUSCULAR | Status: AC
  Administered 2020-07-30: 50 ug via INTRAVENOUS
  Filled 2020-07-30: qty 2

## 2020-07-30 MED ORDER — DOCUSATE SODIUM 100 MG PO CAPS
100.0000 mg | ORAL_CAPSULE | Freq: Two times a day (BID) | ORAL | Status: DC
  Administered 2020-07-30 – 2020-08-03 (×8): 100 mg via ORAL
  Filled 2020-07-30 (×8): qty 1

## 2020-07-30 MED ORDER — DILTIAZEM HCL ER COATED BEADS 120 MG PO CP24
240.0000 mg | ORAL_CAPSULE | Freq: Every day | ORAL | Status: DC
  Administered 2020-07-31 – 2020-08-03 (×4): 240 mg via ORAL
  Filled 2020-07-30 (×4): qty 2

## 2020-07-30 MED ORDER — SODIUM CHLORIDE 0.9 % IV SOLN: INTRAVENOUS | Status: DC | PRN

## 2020-07-30 MED ORDER — LIDOCAINE 2% (20 MG/ML) 5 ML SYRINGE
INTRAMUSCULAR | Status: DC | PRN
  Administered 2020-07-30: 100 mg via INTRAVENOUS

## 2020-07-30 MED ORDER — FLUOXETINE HCL 20 MG PO CAPS
40.0000 mg | ORAL_CAPSULE | Freq: Every day | ORAL | Status: DC
  Administered 2020-07-31 – 2020-08-03 (×4): 40 mg via ORAL
  Filled 2020-07-30 (×4): qty 2

## 2020-07-30 MED ORDER — VITAMIN D 25 MCG (1000 UNIT) PO TABS
2000.0000 [IU] | ORAL_TABLET | Freq: Two times a day (BID) | ORAL | Status: DC
  Administered 2020-07-30 – 2020-08-03 (×8): 2000 [IU] via ORAL
  Filled 2020-07-30 (×8): qty 2

## 2020-07-30 MED ORDER — FOLIC ACID 1 MG PO TABS
1.0000 mg | ORAL_TABLET | Freq: Every day | ORAL | Status: DC
  Administered 2020-07-30 – 2020-08-03 (×5): 1 mg via ORAL
  Filled 2020-07-30 (×5): qty 1

## 2020-07-30 MED ORDER — DEXAMETHASONE SODIUM PHOSPHATE 10 MG/ML IJ SOLN
INTRAMUSCULAR | Status: DC | PRN
  Administered 2020-07-30: 10 mg via INTRAVENOUS

## 2020-07-30 MED ORDER — MIDAZOLAM HCL 2 MG/2ML IJ SOLN
INTRAMUSCULAR | Status: AC
  Administered 2020-07-30: 2 mg via INTRAVENOUS
  Filled 2020-07-30: qty 2

## 2020-07-30 MED ORDER — DEXTROSE 50 % IV SOLN
INTRAVENOUS | Status: AC
  Filled 2020-07-30: qty 50

## 2020-07-30 MED ORDER — CEFAZOLIN SODIUM-DEXTROSE 2-4 GM/100ML-% IV SOLN
2.0000 g | INTRAVENOUS | Status: DC
Start: 2020-07-30 — End: 2020-07-30
  Filled 2020-07-30: qty 100

## 2020-07-30 MED ORDER — FENTANYL CITRATE (PF) 100 MCG/2ML IJ SOLN: 25.0000 ug | INTRAMUSCULAR | Status: DC | PRN

## 2020-07-30 MED ORDER — ONDANSETRON HCL 4 MG/2ML IJ SOLN: 4.0000 mg | Freq: Four times a day (QID) | INTRAMUSCULAR | Status: DC | PRN

## 2020-07-30 MED ORDER — ACETAMINOPHEN 500 MG PO TABS
1000.0000 mg | ORAL_TABLET | Freq: Once | ORAL | Status: AC
  Administered 2020-07-30: 1000 mg via ORAL
  Filled 2020-07-30: qty 2

## 2020-07-30 MED ORDER — INSULIN GLARGINE 100 UNIT/ML ~~LOC~~ SOLN
25.0000 [IU] | Freq: Every day | SUBCUTANEOUS | Status: DC
  Administered 2020-07-30: 25 [IU] via SUBCUTANEOUS
  Filled 2020-07-30 (×3): qty 0.25

## 2020-07-30 MED ORDER — ONDANSETRON HCL 4 MG/2ML IJ SOLN: 4.0000 mg | Freq: Once | INTRAMUSCULAR | Status: DC | PRN

## 2020-07-30 MED ORDER — THIAMINE HCL 100 MG/ML IJ SOLN
100.0000 mg | Freq: Every day | INTRAMUSCULAR | Status: DC
  Filled 2020-07-30: qty 2

## 2020-07-30 MED ORDER — POVIDONE-IODINE 10 % EX SWAB
2.0000 "application " | Freq: Once | CUTANEOUS | Status: AC
  Administered 2020-07-30: 2 via TOPICAL

## 2020-07-30 MED ORDER — BUPIVACAINE-EPINEPHRINE (PF) 0.5% -1:200000 IJ SOLN
INTRAMUSCULAR | Status: DC | PRN
  Administered 2020-07-30: 10 mL via PERINEURAL
  Administered 2020-07-30: 30 mL via PERINEURAL

## 2020-07-30 MED ORDER — ACETAMINOPHEN 500 MG PO TABS
1000.0000 mg | ORAL_TABLET | Freq: Three times a day (TID) | ORAL | Status: DC
  Administered 2020-07-30 – 2020-08-03 (×12): 1000 mg via ORAL
  Filled 2020-07-30 (×12): qty 2

## 2020-07-30 MED ORDER — INSULIN ASPART 100 UNIT/ML ~~LOC~~ SOLN
4.0000 [IU] | Freq: Three times a day (TID) | SUBCUTANEOUS | Status: DC
  Administered 2020-07-30 – 2020-08-03 (×11): 4 [IU] via SUBCUTANEOUS

## 2020-07-30 MED ORDER — ONDANSETRON HCL 4 MG PO TABS: 4.0000 mg | ORAL_TABLET | Freq: Four times a day (QID) | ORAL | Status: DC | PRN

## 2020-07-30 MED ORDER — CHLORHEXIDINE GLUCONATE CLOTH 2 % EX PADS
6.0000 | MEDICATED_PAD | Freq: Every day | CUTANEOUS | Status: DC
  Administered 2020-07-30 – 2020-08-03 (×4): 6 via TOPICAL

## 2020-07-30 MED ORDER — DEXAMETHASONE SODIUM PHOSPHATE 10 MG/ML IJ SOLN
INTRAMUSCULAR | Status: DC | PRN
  Administered 2020-07-30: 8 mg via INTRAVENOUS

## 2020-07-30 MED ORDER — PHENYLEPHRINE 40 MCG/ML (10ML) SYRINGE FOR IV PUSH (FOR BLOOD PRESSURE SUPPORT)
PREFILLED_SYRINGE | INTRAVENOUS | Status: DC | PRN
  Administered 2020-07-30 (×2): 80 ug via INTRAVENOUS

## 2020-07-30 MED ORDER — METOCLOPRAMIDE HCL 5 MG PO TABS: 5.0000 mg | ORAL_TABLET | Freq: Three times a day (TID) | ORAL | Status: DC | PRN

## 2020-07-30 MED ORDER — FENTANYL CITRATE (PF) 100 MCG/2ML IJ SOLN: 50.0000 ug | Freq: Once | INTRAMUSCULAR | Status: AC

## 2020-07-30 MED ORDER — LORAZEPAM 1 MG PO TABS
0.0000 mg | ORAL_TABLET | Freq: Four times a day (QID) | ORAL | Status: AC
  Administered 2020-07-31: 1 mg via ORAL
  Filled 2020-07-30: qty 1

## 2020-07-30 MED ORDER — MIDAZOLAM HCL 2 MG/2ML IJ SOLN: 2.0000 mg | Freq: Once | INTRAMUSCULAR | Status: AC

## 2020-07-30 MED ORDER — MIDAZOLAM HCL 2 MG/2ML IJ SOLN
INTRAMUSCULAR | Status: AC
  Filled 2020-07-30: qty 2

## 2020-07-30 MED ORDER — LACTATED RINGERS IV SOLN: INTRAVENOUS | Status: DC

## 2020-07-30 MED ORDER — SODIUM CHLORIDE 0.9 % IV SOLN
2.0000 g | INTRAVENOUS | Status: AC
  Administered 2020-07-30: 2 g via INTRAVENOUS
  Filled 2020-07-30 (×3): qty 20

## 2020-07-30 MED ORDER — LORAZEPAM 1 MG PO TABS: 1.0000 mg | ORAL_TABLET | ORAL | Status: AC | PRN

## 2020-07-30 MED ORDER — HYDROMORPHONE HCL 1 MG/ML IJ SOLN: 0.5000 mg | INTRAMUSCULAR | Status: DC | PRN

## 2020-07-30 MED ORDER — CEFAZOLIN SODIUM-DEXTROSE 1-4 GM/50ML-% IV SOLN
1.0000 g | Freq: Four times a day (QID) | INTRAVENOUS | Status: AC
  Administered 2020-07-30 – 2020-07-31 (×3): 1 g via INTRAVENOUS
  Filled 2020-07-30 (×3): qty 50

## 2020-07-30 MED ORDER — CHLORHEXIDINE GLUCONATE 4 % EX LIQD
60.0000 mL | Freq: Once | CUTANEOUS | Status: DC
Start: 2020-07-30 — End: 2020-07-30

## 2020-07-30 MED ORDER — LORAZEPAM 1 MG PO TABS
0.0000 mg | ORAL_TABLET | Freq: Two times a day (BID) | ORAL | Status: DC
  Administered 2020-08-02: 1 mg via ORAL
  Filled 2020-07-30: qty 1

## 2020-07-30 MED ORDER — MUPIROCIN 2 % EX OINT
1.0000 "application " | TOPICAL_OINTMENT | Freq: Two times a day (BID) | CUTANEOUS | Status: DC
  Administered 2020-07-30 – 2020-08-03 (×9): 1 via NASAL
  Filled 2020-07-30 (×3): qty 22

## 2020-07-30 MED ORDER — DEXAMETHASONE SODIUM PHOSPHATE 10 MG/ML IJ SOLN
INTRAMUSCULAR | Status: AC
  Filled 2020-07-30: qty 1

## 2020-07-30 MED ORDER — PROPOFOL 10 MG/ML IV BOLUS
INTRAVENOUS | Status: DC | PRN
  Administered 2020-07-30: 170 mg via INTRAVENOUS
  Administered 2020-07-30: 40 mg via INTRAVENOUS

## 2020-07-30 MED ORDER — METOCLOPRAMIDE HCL 5 MG/ML IJ SOLN: 5.0000 mg | Freq: Three times a day (TID) | INTRAMUSCULAR | Status: DC | PRN

## 2020-07-30 MED ORDER — LORAZEPAM 2 MG/ML IJ SOLN: 1.0000 mg | INTRAMUSCULAR | Status: AC | PRN

## 2020-07-30 MED ORDER — THIAMINE HCL 100 MG PO TABS
100.0000 mg | ORAL_TABLET | Freq: Every day | ORAL | Status: DC
  Administered 2020-07-30 – 2020-08-03 (×5): 100 mg via ORAL
  Filled 2020-07-30 (×5): qty 1

## 2020-07-30 MED ORDER — PROPOFOL 10 MG/ML IV BOLUS
INTRAVENOUS | Status: AC
  Filled 2020-07-30: qty 20

## 2020-07-30 MED ORDER — SPIRITUS FRUMENTI
1.0000 | Freq: Three times a day (TID) | ORAL | Status: DC
  Administered 2020-07-30 – 2020-08-03 (×9): 1 via ORAL
  Filled 2020-07-30 (×15): qty 1

## 2020-07-30 MED ORDER — ACETAMINOPHEN 325 MG PO TABS: 325.0000 mg | ORAL_TABLET | Freq: Four times a day (QID) | ORAL | Status: DC | PRN

## 2020-07-30 MED ORDER — METHOCARBAMOL 500 MG PO TABS
500.0000 mg | ORAL_TABLET | Freq: Four times a day (QID) | ORAL | Status: DC | PRN
  Administered 2020-07-30 – 2020-08-02 (×5): 500 mg via ORAL
  Filled 2020-07-30 (×5): qty 1

## 2020-07-30 MED ORDER — ORAL CARE MOUTH RINSE: 15.0000 mL | Freq: Once | OROMUCOSAL | Status: AC

## 2020-07-30 MED ORDER — CHLORHEXIDINE GLUCONATE 0.12 % MT SOLN
15.0000 mL | Freq: Once | OROMUCOSAL | Status: AC
  Administered 2020-07-30: 15 mL via OROMUCOSAL
  Filled 2020-07-30: qty 15

## 2020-07-30 MED ORDER — MAGNESIUM CITRATE PO SOLN: 1.0000 | Freq: Once | ORAL | Status: DC | PRN

## 2020-07-30 MED ORDER — INSULIN ASPART 100 UNIT/ML ~~LOC~~ SOLN
0.0000 [IU] | Freq: Three times a day (TID) | SUBCUTANEOUS | Status: DC
  Administered 2020-07-30: 15 [IU] via SUBCUTANEOUS
  Administered 2020-07-31: 11 [IU] via SUBCUTANEOUS
  Administered 2020-07-31: 5 [IU] via SUBCUTANEOUS
  Administered 2020-07-31: 11 [IU] via SUBCUTANEOUS
  Administered 2020-08-01: 3 [IU] via SUBCUTANEOUS
  Administered 2020-08-01: 5 [IU] via SUBCUTANEOUS
  Administered 2020-08-01: 8 [IU] via SUBCUTANEOUS
  Administered 2020-08-02 (×2): 3 [IU] via SUBCUTANEOUS
  Administered 2020-08-03: 11 [IU] via SUBCUTANEOUS
  Administered 2020-08-03: 2 [IU] via SUBCUTANEOUS

## 2020-07-30 MED ORDER — FENTANYL CITRATE (PF) 250 MCG/5ML IJ SOLN
INTRAMUSCULAR | Status: AC
  Filled 2020-07-30: qty 5

## 2020-07-30 MED ORDER — BISACODYL 5 MG PO TBEC: 5.0000 mg | DELAYED_RELEASE_TABLET | Freq: Every day | ORAL | Status: DC | PRN

## 2020-07-30 MED ORDER — PANTOPRAZOLE SODIUM 40 MG PO TBEC
40.0000 mg | DELAYED_RELEASE_TABLET | Freq: Every day | ORAL | Status: DC
  Administered 2020-07-31 – 2020-08-03 (×4): 40 mg via ORAL
  Filled 2020-07-30 (×4): qty 1

## 2020-07-30 MED ORDER — PHENYLEPHRINE 40 MCG/ML (10ML) SYRINGE FOR IV PUSH (FOR BLOOD PRESSURE SUPPORT)
PREFILLED_SYRINGE | INTRAVENOUS | Status: AC
  Filled 2020-07-30: qty 10

## 2020-07-30 MED ORDER — ADULT MULTIVITAMIN W/MINERALS CH
1.0000 | ORAL_TABLET | Freq: Every day | ORAL | Status: DC
  Administered 2020-07-30 – 2020-08-03 (×5): 1 via ORAL
  Filled 2020-07-30 (×5): qty 1

## 2020-07-30 MED ORDER — OXYCODONE HCL 5 MG PO TABS
5.0000 mg | ORAL_TABLET | ORAL | Status: DC | PRN
  Administered 2020-07-30 – 2020-08-03 (×8): 10 mg via ORAL
  Filled 2020-07-30 (×8): qty 2

## 2020-07-30 MED ORDER — ASCORBIC ACID 500 MG PO TABS
500.0000 mg | ORAL_TABLET | Freq: Every day | ORAL | Status: DC
  Administered 2020-07-31 – 2020-08-03 (×4): 500 mg via ORAL
  Filled 2020-07-30 (×4): qty 1

## 2020-07-30 MED ORDER — ONDANSETRON HCL 4 MG/2ML IJ SOLN
INTRAMUSCULAR | Status: DC | PRN
  Administered 2020-07-30: 4 mg via INTRAVENOUS

## 2020-07-30 MED ORDER — SENNOSIDES-DOCUSATE SODIUM 8.6-50 MG PO TABS: 1.0000 | ORAL_TABLET | Freq: Every evening | ORAL | Status: DC | PRN

## 2020-07-30 SURGICAL SUPPLY — 87 items
BANDAGE ESMARK 6X9 LF (GAUZE/BANDAGES/DRESSINGS) ×1 IMPLANT
BIT DRILL CALIBRATED 4.3X320MM (BIT) IMPLANT
BIT DRILL CANN 7X200 (BIT) ×2 IMPLANT
BNDG CMPR 9X6 STRL LF SNTH (GAUZE/BANDAGES/DRESSINGS) ×1
BNDG COHESIVE 6X5 TAN STRL LF (GAUZE/BANDAGES/DRESSINGS) ×3 IMPLANT
BNDG ELASTIC 4X5.8 VLCR STR LF (GAUZE/BANDAGES/DRESSINGS) ×5 IMPLANT
BNDG ELASTIC 6X5.8 VLCR STR LF (GAUZE/BANDAGES/DRESSINGS) ×3 IMPLANT
BNDG ESMARK 6X9 LF (GAUZE/BANDAGES/DRESSINGS) ×3
BNDG GAUZE ELAST 4 BULKY (GAUZE/BANDAGES/DRESSINGS) ×2 IMPLANT
BRUSH SCRUB EZ PLAIN DRY (MISCELLANEOUS) ×6 IMPLANT
CANISTER SUCT 3000ML PPV (MISCELLANEOUS) ×2 IMPLANT
CANISTER WOUNDNEG PRESSURE 500 (CANNISTER) ×2 IMPLANT
CLOSURE WOUND 1/2 X4 (GAUZE/BANDAGES/DRESSINGS)
COVER SURGICAL LIGHT HANDLE (MISCELLANEOUS) ×4 IMPLANT
COVER WAND RF STERILE (DRAPES) ×1 IMPLANT
CUFF TOURN SGL QUICK 18X4 (TOURNIQUET CUFF) IMPLANT
CUFF TOURN SGL QUICK 24 (TOURNIQUET CUFF) ×3
CUFF TOURN SGL QUICK 34 (TOURNIQUET CUFF)
CUFF TRNQT CYL 24X4X16.5-23 (TOURNIQUET CUFF) IMPLANT
CUFF TRNQT CYL 34X4.125X (TOURNIQUET CUFF) IMPLANT
DRAPE C-ARM 42X72 X-RAY (DRAPES) ×2 IMPLANT
DRAPE C-ARMOR (DRAPES) ×3 IMPLANT
DRAPE DERMATAC (DRAPES) ×4 IMPLANT
DRAPE INCISE IOBAN 66X45 STRL (DRAPES) ×2 IMPLANT
DRAPE U-SHAPE 47X51 STRL (DRAPES) ×3 IMPLANT
DRILL CALIBRATED 4.3X320MM (BIT) ×6
DRSG ADAPTIC 3X8 NADH LF (GAUZE/BANDAGES/DRESSINGS) ×1 IMPLANT
DRSG MEPILEX BORDER 4X4 (GAUZE/BANDAGES/DRESSINGS) ×2 IMPLANT
ELECT REM PT RETURN 9FT ADLT (ELECTROSURGICAL) ×3
ELECTRODE REM PT RTRN 9FT ADLT (ELECTROSURGICAL) ×1 IMPLANT
GAUZE SPONGE 4X4 12PLY STRL (GAUZE/BANDAGES/DRESSINGS) ×1 IMPLANT
GLOVE BIO SURGEON STRL SZ 6.5 (GLOVE) ×1 IMPLANT
GLOVE BIO SURGEON STRL SZ7.5 (GLOVE) ×3 IMPLANT
GLOVE BIO SURGEON STRL SZ8 (GLOVE) ×5 IMPLANT
GLOVE BIO SURGEONS STRL SZ 6.5 (GLOVE) ×1
GLOVE BIOGEL PI IND STRL 7.0 (GLOVE) IMPLANT
GLOVE BIOGEL PI IND STRL 7.5 (GLOVE) ×1 IMPLANT
GLOVE BIOGEL PI IND STRL 8 (GLOVE) ×1 IMPLANT
GLOVE BIOGEL PI INDICATOR 7.0 (GLOVE) ×2
GLOVE BIOGEL PI INDICATOR 7.5 (GLOVE) ×2
GLOVE BIOGEL PI INDICATOR 8 (GLOVE) ×2
GOWN STRL REUS W/ TWL LRG LVL3 (GOWN DISPOSABLE) ×2 IMPLANT
GOWN STRL REUS W/ TWL XL LVL3 (GOWN DISPOSABLE) ×1 IMPLANT
GOWN STRL REUS W/TWL LRG LVL3 (GOWN DISPOSABLE) ×9
GOWN STRL REUS W/TWL XL LVL3 (GOWN DISPOSABLE) ×3
GUIDEPIN 3.2X17.5 THRD DISP (PIN) ×2 IMPLANT
GUIDEWIRE 2.6X80 BEAD TIP (WIRE) IMPLANT
GUIDWIRE 2.6X80 BEAD TIP (WIRE) ×3
KIT BASIN OR (CUSTOM PROCEDURE TRAY) ×3 IMPLANT
KIT TURNOVER KIT B (KITS) ×3 IMPLANT
MANIFOLD NEPTUNE II (INSTRUMENTS) ×1 IMPLANT
NAIL ANKLE LOCK ANTE 10X180 (Nail) ×2 IMPLANT
NEEDLE 22X1 1/2 (OR ONLY) (NEEDLE) IMPLANT
NS IRRIG 1000ML POUR BTL (IV SOLUTION) ×3 IMPLANT
PACK ORTHO EXTREMITY (CUSTOM PROCEDURE TRAY) ×3 IMPLANT
PAD ABD 8X10 STRL (GAUZE/BANDAGES/DRESSINGS) ×2 IMPLANT
PAD ARMBOARD 7.5X6 YLW CONV (MISCELLANEOUS) ×6 IMPLANT
PAD CAST 4YDX4 CTTN HI CHSV (CAST SUPPLIES) IMPLANT
PADDING CAST COTTON 4X4 STRL (CAST SUPPLIES) ×3
PADDING CAST COTTON 6X4 STRL (CAST SUPPLIES) ×5 IMPLANT
PREVENA RESTOR AXIOFORM 29X28 (GAUZE/BANDAGES/DRESSINGS) ×2 IMPLANT
SCREW CORT TI DBL LEAD 5X20 (Screw) ×2 IMPLANT
SCREW CORT TI DBL LEAD 5X22 (Screw) ×2 IMPLANT
SCREW CORT TI DBL LEAD 5X60 (Screw) ×2 IMPLANT
SCREW CORT TI DBLE LEAD 5X28 (Screw) ×2 IMPLANT
SPONGE LAP 18X18 RF (DISPOSABLE) ×3 IMPLANT
STAPLER VISISTAT 35W (STAPLE) IMPLANT
STOCKINETTE IMPERVIOUS LG (DRAPES) ×3 IMPLANT
STRIP CLOSURE SKIN 1/2X4 (GAUZE/BANDAGES/DRESSINGS) IMPLANT
SUCTION FRAZIER HANDLE 10FR (MISCELLANEOUS)
SUCTION TUBE FRAZIER 10FR DISP (MISCELLANEOUS) IMPLANT
SUT ETHILON 2 0 FS 18 (SUTURE) ×4 IMPLANT
SUT ETHILON 2 0 PSLX (SUTURE) ×6 IMPLANT
SUT ETHILON 3 0 PS 1 (SUTURE) IMPLANT
SUT PDS AB 2-0 CT1 27 (SUTURE) IMPLANT
SUT VIC AB 0 CT1 27 (SUTURE)
SUT VIC AB 0 CT1 27XBRD ANBCTR (SUTURE) IMPLANT
SUT VIC AB 2-0 CT1 27 (SUTURE)
SUT VIC AB 2-0 CT1 TAPERPNT 27 (SUTURE) IMPLANT
SYR CONTROL 10ML LL (SYRINGE) IMPLANT
TOWEL GREEN STERILE (TOWEL DISPOSABLE) ×4 IMPLANT
TOWEL GREEN STERILE FF (TOWEL DISPOSABLE) ×4 IMPLANT
TUBE CONNECTING 12'X1/4 (SUCTIONS) ×1
TUBE CONNECTING 12X1/4 (SUCTIONS) ×2 IMPLANT
UNDERPAD 30X36 HEAVY ABSORB (UNDERPADS AND DIAPERS) ×3 IMPLANT
WATER STERILE IRR 1000ML POUR (IV SOLUTION) ×2 IMPLANT
YANKAUER SUCT BULB TIP NO VENT (SUCTIONS) ×3 IMPLANT

## 2020-07-30 NOTE — Progress Notes (Signed)
Inpatient Diabetes Program Recommendations  AACE/ADA: New Consensus Statement on Inpatient Glycemic Control (2015)  Target Ranges:  Prepandial:   less than 140 mg/dL      Peak postprandial:   less than 180 mg/dL (1-2 hours)      Critically ill patients:  140 - 180 mg/dL   Lab Results  Component Value Date   GLUCAP 166 (H) 07/30/2020   HGBA1C 7.7 (H) 07/30/2020    Review of Glycemic Control Results for [name], Michaela Johnson (MRN [id number]) as of 07/30/2020 16:22  Ref. Range 07/04/2020 15:49 07/30/2020 06:23 07/30/2020 12:36  Glucose-Capillary Latest Ref Range: 70 - 99 mg/dL 164 (H) 106 (H) 166 (H)   Diabetes history: DM 2 Outpatient Diabetes medications:  Lantus 46 units q AM, Novolog 4 units tid with meals Current orders for Inpatient glycemic control:  Lantus 25 units q HS, Novolog 4 units tid with meals, Novolog moderate tid with meals Inpatient Diabetes Program Recommendations:   Agree with current orders.  Will follow.  A1C indicates fair control of DM prior to admit.   Thanks,  Adah Perl, RN, BC-ADM Inpatient Diabetes Coordinator Pager [phone] (8a-5p)

## 2020-07-30 NOTE — Anesthesia Procedure Notes (Signed)
Procedure Name: LMA Insertion Date/Time: 07/30/2020 8:37 AM Performed by: Myna Bright, CRNA Pre-anesthesia Checklist: Patient identified, Emergency Drugs available, Suction available and Patient being monitored Patient Re-evaluated:Patient Re-evaluated prior to induction Oxygen Delivery Method: Circle system utilized Preoxygenation: Pre-oxygenation with 100% oxygen Induction Type: IV induction Ventilation: Mask ventilation without difficulty LMA: LMA inserted LMA Size: 4.0 Number of attempts: 1 Placement Confirmation: positive ETCO2 and breath sounds checked- equal and bilateral Tube secured with: Tape Dental Injury: Teeth and Oropharynx as per pre-operative assessment

## 2020-07-30 NOTE — Anesthesia Procedure Notes (Signed)
Anesthesia Regional Block: Adductor canal block   Pre-Anesthetic Checklist: ,, timeout performed, Correct Patient, Correct Site, Correct Laterality, Correct Procedure, Correct Position, site marked, Risks and benefits discussed,  Surgical consent,  Pre-op evaluation,  At surgeon's request and post-op pain management  Laterality: Right  Prep: chloraprep       Needles:  Injection technique: Single-shot  Needle Type: Echogenic Needle     Needle Length: 9cm  Needle Gauge: 21     Additional Needles:   Procedures:,,,, ultrasound used (permanent image in chart),,,,  Narrative:  Start time: 07/30/2020 7:20 AM End time: 07/30/2020 7:25 AM Injection made incrementally with aspirations every 5 mL.  Performed by: Personally  Anesthesiologist: Catalina Gravel, MD  Additional Notes: No pain on injection. No increased resistance to injection. Injection made in 5cc increments.  Good needle visualization.  Patient tolerated procedure well.

## 2020-07-30 NOTE — Anesthesia Procedure Notes (Signed)
Anesthesia Regional Block: Popliteal block   Pre-Anesthetic Checklist: ,, timeout performed, Correct Patient, Correct Site, Correct Laterality, Correct Procedure, Correct Position, site marked, Risks and benefits discussed,  Surgical consent,  Pre-op evaluation,  At surgeon's request and post-op pain management  Laterality: Right  Prep: chloraprep       Needles:  Injection technique: Single-shot  Needle Type: Echogenic Needle     Needle Length: 9cm  Needle Gauge: 21     Additional Needles:   Procedures:,,,, ultrasound used (permanent image in chart),,,,  Narrative:  Start time: 07/30/2020 7:10 AM End time: 07/30/2020 7:20 AM Injection made incrementally with aspirations every 5 mL.  Performed by: Personally  Anesthesiologist: Catalina Gravel, MD  Additional Notes: No pain on injection. No increased resistance to injection. Injection made in 5cc increments.  Good needle visualization.  Patient tolerated procedure well.

## 2020-07-30 NOTE — Brief Op Note (Signed)
07/30/2020  6:33 PM  PATIENT:  Michaela Johnson  60 y.o. female  PRE-OPERATIVE DIAGNOSIS:  RIGHT ANKLE FRACTURE DISLOCATION S/P FAILED INTERNAL FIXATION OF TRIMALLEOLAR FRACTURE AND SYNDESMOSIS  POST-OPERATIVE DIAGNOSIS:  RIGHT ANKLE FRACTURE DISLOCATION S/P FAILED INTERNAL FIXATION OF TRIMALLEOLAR FRACTURE AND SYNDESMOSIS  PROCEDURE:  Procedure(s): 1. RIGHT ANKLE ARTHRODESIS WITH BIOMET 10 X 180 MM FUSION NAIL. 2. RIGHT TIBIA OSTEOTOMY. 3. REMOVAL OF DEEP IMPLANT TIBIA AND FIBULA. 4. MANUAL APPLICATION OF STRESS UNDER FLUORO 5. APPLICATION OF WOUND VAC (Right)  SURGEON:  Surgeon(s) and Role:    Altamese Mesilla, MD - Primary  PHYSICIAN ASSISTANT: 1. KEITH PAUL, PA-C; 2. PA Student  ANESTHESIA:   general  EBL:  100 mL   BLOOD ADMINISTERED:none  DRAINS: none   LOCAL MEDICATIONS USED:  NONE  SPECIMEN:  Source of Specimen:  left lateral ankle wound  DISPOSITION OF SPECIMEN:   micro  COUNTS:  YES  TOURNIQUET:  * No tourniquets in log *  DICTATION: .Other Dictation: Dictation Number [id number]  PLAN OF CARE: Admit to inpatient   PATIENT DISPOSITION:  PACU - hemodynamically stable.   Delay start of Pharmacological VTE agent (>24hrs) due to surgical blood loss or risk of bleeding: no

## 2020-07-30 NOTE — Plan of Care (Signed)

## 2020-07-30 NOTE — Plan of Care (Signed)

## 2020-07-30 NOTE — Transfer of Care (Signed)
Immediate Anesthesia Transfer of Care Note  Patient: Michaela Johnson  Procedure(s) Performed: HARDWARE REMOVAL RIGHT ANKLE (Right Ankle) ANKLE FUSION (Right Ankle) APPLICATION OF WOUND VAC (Right Ankle)  Patient Location: PACU  Anesthesia Type:General  Level of Consciousness: awake, alert , oriented and patient cooperative  Airway & Oxygen Therapy: Patient Spontanous Breathing and Patient connected to nasal cannula oxygen  Post-op Assessment: Report given to RN, Post -op Vital signs reviewed and stable and Patient moving all extremities  Post vital signs: Reviewed and stable  Last Vitals:  Vitals Value Taken Time  BP 105/75 07/30/20 1234  Temp    Pulse 80 07/30/20 1236  Resp 12 07/30/20 1236  SpO2 97 % 07/30/20 1236  Vitals shown include unvalidated device data.  Last Pain:  Vitals:   07/30/20 0735  TempSrc:   PainSc: 0-No pain      Patients Stated Pain Goal: 4 (92/11/94 1740)  Complications: No complications documented.

## 2020-07-30 NOTE — Anesthesia Postprocedure Evaluation (Signed)
Anesthesia Post Note  Patient: Michaela Johnson  Procedure(s) Performed: HARDWARE REMOVAL RIGHT ANKLE (Right Ankle) ANKLE FUSION (Right Ankle) APPLICATION OF WOUND VAC (Right Ankle)     Patient location during evaluation: PACU Anesthesia Type: General Level of consciousness: awake and alert Pain management: pain level controlled Vital Signs Assessment: post-procedure vital signs reviewed and stable Respiratory status: spontaneous breathing, nonlabored ventilation, respiratory function stable and patient connected to nasal cannula oxygen Cardiovascular status: blood pressure returned to baseline and stable Postop Assessment: no apparent nausea or vomiting Anesthetic complications: no   No complications documented.  Last Vitals:  Vitals:   07/30/20 1340 07/30/20 1500  BP: 131/69 136/71  Pulse: 87 92  Resp: 15 16  Temp: 36.6 C 36.6 C  SpO2: 95% 98%    Last Pain:  Vitals:   07/30/20 1500  TempSrc: Oral  PainSc:                  Catalina Gravel

## 2020-07-31 ENCOUNTER — Encounter (HOSPITAL_COMMUNITY): Payer: Self-pay | Admitting: Orthopedic Surgery

## 2020-07-31 DIAGNOSIS — F1090 Alcohol use, unspecified, uncomplicated: Secondary | ICD-10-CM

## 2020-07-31 DIAGNOSIS — Z7289 Other problems related to lifestyle: Secondary | ICD-10-CM

## 2020-07-31 DIAGNOSIS — F17200 Nicotine dependence, unspecified, uncomplicated: Secondary | ICD-10-CM

## 2020-07-31 DIAGNOSIS — Z7901 Long term (current) use of anticoagulants: Secondary | ICD-10-CM

## 2020-07-31 HISTORY — DX: Alcohol use, unspecified, uncomplicated: F10.90

## 2020-07-31 HISTORY — DX: Nicotine dependence, unspecified, uncomplicated: F17.200

## 2020-07-31 HISTORY — PX: OTHER SURGICAL HISTORY: SHX169

## 2020-07-31 HISTORY — DX: Long term (current) use of anticoagulants: Z79.01

## 2020-07-31 HISTORY — DX: Other problems related to lifestyle: Z72.89

## 2020-07-31 LAB — CBC
HCT: 27.7 % — ABNORMAL LOW (ref 36.0–46.0)
Hemoglobin: 9.7 g/dL — ABNORMAL LOW (ref 12.0–15.0)
MCH: 33.4 pg (ref 26.0–34.0)
MCHC: 35 g/dL (ref 30.0–36.0)
MCV: 95.5 fL (ref 80.0–100.0)
Platelets: 288 10*3/uL (ref 150–400)
RBC: 2.9 MIL/uL — ABNORMAL LOW (ref 3.87–5.11)
RDW: 12.6 % (ref 11.5–15.5)
WBC: 12.5 10*3/uL — ABNORMAL HIGH (ref 4.0–10.5)
nRBC: 0 % (ref 0.0–0.2)

## 2020-07-31 LAB — GLUCOSE, CAPILLARY
Glucose-Capillary: 350 mg/dL — ABNORMAL HIGH (ref 70–99)
Glucose-Capillary: 318 mg/dL — ABNORMAL HIGH (ref 70–99)
Glucose-Capillary: 206 mg/dL — ABNORMAL HIGH (ref 70–99)
Glucose-Capillary: 186 mg/dL — ABNORMAL HIGH (ref 70–99)

## 2020-07-31 MED ORDER — INSULIN GLARGINE 100 UNIT/ML ~~LOC~~ SOLN
30.0000 [IU] | Freq: Every day | SUBCUTANEOUS | Status: DC
  Filled 2020-07-31: qty 0.3

## 2020-07-31 MED ORDER — INSULIN GLARGINE 100 UNIT/ML ~~LOC~~ SOLN
46.0000 [IU] | Freq: Every day | SUBCUTANEOUS | Status: DC
  Administered 2020-07-31 – 2020-08-02 (×3): 46 [IU] via SUBCUTANEOUS
  Filled 2020-07-31 (×4): qty 0.46

## 2020-07-31 MED ORDER — RIVAROXABAN 20 MG PO TABS
20.0000 mg | ORAL_TABLET | Freq: Every day | ORAL | Status: DC
Start: 2020-07-31 — End: 2020-08-03
  Administered 2020-07-31 – 2020-08-01 (×2): 20 mg via ORAL
  Filled 2020-07-31 (×2): qty 1

## 2020-07-31 MED ORDER — MUSCLE RUB 10-15 % EX CREA
1.0000 "application " | TOPICAL_CREAM | CUTANEOUS | Status: DC | PRN
  Filled 2020-07-31: qty 85

## 2020-07-31 NOTE — Progress Notes (Signed)
Orthopaedic Trauma Service Progress Note  Patient ID: Michaela Johnson MRN: [id number] DOB/AGE: March 18, 1961 60 y.o.  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 Has a friend checking in on him daily   cbg's elevated  Appreciate DM coordinator eval   ROS As above  Objective:   VITALS:   Vitals:   07/30/20 2100 07/31/20 0025 07/31/20 0430 07/31/20 0745  BP: (!) 141/71 (!) 124/56 140/77 (!) 142/61  Pulse: 84 82 82 89  Resp: 18 16 20 16   Temp: 98.6 F (37 C) 98.2 F (36.8 C) 97.8 F (36.6 C) 98.6 F (37 C)  TempSrc: Oral Oral Oral Oral  SpO2: 99% 97% 99% 98%  Weight:      Height:        Estimated body mass index is 27.44 kg/m as calculated from the following:   Height as of this encounter: 5\' 2"  (1.575 m).   Weight as of this encounter: 68 kg.   Intake/Output      01/10 0701 01/11 0700 01/11 0701 01/12 0700   P.O. 480 240   I.V. (mL/kg) 1751.7 (25.8) 688.8 (10.1)   IV Piggyback 121.9 128.1   Total Intake(mL/kg) 2353.6 (34.6) 1056.9 (15.5)   Urine (mL/kg/hr) 0 (0)    Drains 0    Blood 100    Total Output 100    Net +2253.6 +1056.9        Urine Occurrence 5 x 1 x     LABS  Results for orders placed or performed during the hospital encounter of 07/30/20 (from the past 24 hour(s))  Glucose, capillary     Status: Abnormal   Collection Time: 07/30/20 12:36 PM  Result Value Ref Range   Glucose-Capillary 166 (H) 70 - 99 mg/dL   Comment 1 Notify RN    Comment 2 Document in Chart   Comprehensive metabolic panel     Status: Abnormal   Collection Time: 07/30/20  2:26 PM  Result Value Ref Range   Sodium 134 (L) 135 - 145 mmol/L   Potassium 4.2 3.5 - 5.1 mmol/L   Chloride 99 98 - 111 mmol/L   CO2 21 (L) 22 - 32 mmol/L   Glucose, Bld 225 (H) 70 - 99 mg/dL   BUN 11 8 - 23 mg/dL    Creatinine, Ser 0.69 0.44 - 1.00 mg/dL   Calcium 8.5 (L) 8.9 - 10.3 mg/dL   Total Protein 6.6 6.5 - 8.1 g/dL   Albumin 3.0 (L) 3.5 - 5.0 g/dL   AST 24 15 - 41 U/L   ALT 13 0 - 44 U/L   Alkaline Phosphatase 106 38 - 126 U/L   Total Bilirubin 0.3 0.3 - 1.2 mg/dL   GFR, Estimated >60 >60 mL/min   Anion gap 14 5 - 15  Magnesium     Status: None   Collection Time: 07/30/20  2:26 PM  Result Value Ref Range   Magnesium 1.7 1.7 - 2.4 mg/dL  Phosphorus     Status: None   Collection Time: 07/30/20  2:26 PM  Result Value Ref Range   Phosphorus 4.0 2.5 - 4.6 mg/dL  CBC     Status: Abnormal   Collection Time: 07/30/20  2:26 PM  Result  Value Ref Range   WBC 9.4 4.0 - 10.5 K/uL   RBC 3.58 (L) 3.87 - 5.11 MIL/uL   Hemoglobin 11.5 (L) 12.0 - 15.0 g/dL   HCT 35.4 (L) 36.0 - 46.0 %   MCV 98.9 80.0 - 100.0 fL   MCH 32.1 26.0 - 34.0 pg   MCHC 32.5 30.0 - 36.0 g/dL   RDW 12.6 11.5 - 15.5 %   Platelets 295 150 - 400 K/uL   nRBC 0.0 0.0 - 0.2 %  Glucose, capillary     Status: Abnormal   Collection Time: 07/30/20  4:41 PM  Result Value Ref Range   Glucose-Capillary 398 (H) 70 - 99 mg/dL  Glucose, capillary     Status: Abnormal   Collection Time: 07/30/20  8:13 PM  Result Value Ref Range   Glucose-Capillary 350 (H) 70 - 99 mg/dL  CBC     Status: Abnormal   Collection Time: 07/31/20  2:24 AM  Result Value Ref Range   WBC 12.5 (H) 4.0 - 10.5 K/uL   RBC 2.90 (L) 3.87 - 5.11 MIL/uL   Hemoglobin 9.7 (L) 12.0 - 15.0 g/dL   HCT 27.7 (L) 36.0 - 46.0 %   MCV 95.5 80.0 - 100.0 fL   MCH 33.4 26.0 - 34.0 pg   MCHC 35.0 30.0 - 36.0 g/dL   RDW 12.6 11.5 - 15.5 %   Platelets 288 150 - 400 K/uL   nRBC 0.0 0.0 - 0.2 %  Glucose, capillary     Status: Abnormal   Collection Time: 07/31/20  7:31 AM  Result Value Ref Range   Glucose-Capillary 350 (H) 70 - 99 mg/dL     PHYSICAL EXAM:   Gen: sitting up, doing well, no acute issues  Lungs: unlabored Cardiac: s1 and s2 Ext:       Right Lower Extremity    Splint fitting well  prevena with good seal and suction   Motor and sensory functions at baseline  Ext warm   Good perfusion distally   Assessment/Plan: 1 Day Post-Op   Principal Problem:   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 Active Problems:   PAF (paroxysmal atrial fibrillation) ([facility])   H/O medication noncompliance   Diabetes ([facility])   Chronic anticoagulation   Chronic alcohol use   Nicotine dependence   Anti-infectives (From admission, onward)   Start     Dose/Rate Route Frequency Ordered Stop   07/30/20 1600  ceFAZolin (ANCEF) IVPB 1 g/50 mL premix        1 g 100 mL/hr over 30 Minutes Intravenous Every 6 hours 07/30/20 1343 07/31/20 0644   07/30/20 0830  cefTRIAXone (ROCEPHIN) 2 g in sodium chloride 0.9 % 100 mL IVPB        2 g 200 mL/hr over 30 Minutes Intravenous To Surgery 07/30/20 0824 07/30/20 0903   07/30/20 0600  ceFAZolin (ANCEF) IVPB 2g/100 mL premix  Status:  Discontinued        2 g 200 mL/hr over 30 Minutes Intravenous On call to O.R. 07/30/20 7867 07/30/20 1333    .  POD/HD#: 1  60 y/o female s/p R tibiotalocalcalcaneal fusion for loss of reduction of ORIF R ankle fracture dislocation   -loss of reduction R ankle fracture dislocation with R tibiotalocalcaneal fusion   NwB R leg x 6-8 weeks  Splint x 2 weeks then SLC  prevena x 2 weeks  PT/OT evals  Pt requests SNF as she does not have sufficient help at home   -  Pain management:  Continue with current regimen    - ABL anemia/Hemodynamics  Stable  Monitor   - Medical issues    DM   More aggressive sugar control   Chronic EtOH    Whiskey ordered with meals   PRN ativan also available per protocol    Nicotine dependence   No nicotine products including patches   - DVT/PE prophylaxis:  Resume anticoagulation PTA   - ID:   periop abx  - Metabolic Bone Disease:  Vitamin d levels look reasonable  Medical history and clinical assessment notable for poor bone  quality   Meets criteria for fracture liaisons eval, order placed    - Activity:  OOB with assistance  NWB R leg  - FEN/GI prophylaxis/Foley/Lines:  Carb mod diet   -Ex-fix/Splint care:  Keep splint clean and dry   - Impediments to fracture healing:  DM  Nicotine and alcohol use  History of noncompliance with WB restrictions   - Dispo:  Therapy evals  SW consult for SNF    ? outpt resources to help with pts husband     Jari Pigg, PA-C [phone] (C) 07/31/2020, 11:19 AM  [facility] [place] [address] [phone] Domingo Sep (F)    After 5pm and on the weekends please log on to Amion, go to orthopaedics and the look under the [facility] Call for the provider(s) on call. You can also call our office at [phone] and then follow the prompts to be connected to the call team.

## 2020-07-31 NOTE — Plan of Care (Signed)

## 2020-07-31 NOTE — Plan of Care (Signed)

## 2020-07-31 NOTE — Evaluation (Signed)
Physical Therapy Evaluation Patient Details Name: Michaela Johnson MRN: [id number] DOB: 1960-10-06 Today's Date: 07/31/2020   History of Present Illness  60 y/o female s/p R tibiotalocalcalcaneal fusion for loss of reduction of ORIF R ankle fracture dislocation  Clinical Impression  Pt fully participated in session with motivation to maintain NWB during session. Prior to ankle injury pt I without AD, since pt has had difficult time maintaining NWB due to assisting husband around the house. Pt is requiring increased assist to perform sit<>stand and maintain NWB on RLE. Pt also demonstrating deficits in balance, strength, coordination and gait. Pt will benefit from skilled PT to address deficits to maximize independence with functional mobility prior to discharge.      Follow Up Recommendations SNF;Supervision/Assistance - 24 hour    Equipment Recommendations  Other (comment) (defer to next facility)    Recommendations for Other Services       Precautions / Restrictions Precautions Precautions: Fall Restrictions Weight Bearing Restrictions: Yes RLE Weight Bearing: Non weight bearing      Mobility  Bed Mobility Overal bed mobility: Modified Independent                  Transfers Overall transfer level: Needs assistance Equipment used: Rolling walker (2 wheeled) Transfers: Sit to/from Stand Sit to Stand: Mod assist         General transfer comment: mod A needed to power up to maintain NWB on RLE  Ambulation/Gait Ambulation/Gait assistance: Min assist Gait Distance (Feet): 4 Feet Assistive device: Rolling walker (2 wheeled)       General Gait Details: pt able to hop on LLE and maintain NWB with min gaurd assist to hop to chair  Stairs            Wheelchair Mobility    Modified Rankin (Stroke Patients Only)       Balance Overall balance assessment: Needs assistance   Sitting balance-Leahy Scale: Good     Standing balance support: Bilateral upper  extremity supported Standing balance-Leahy Scale: Poor                               Pertinent Vitals/Pain Pain Assessment: 0-10 Pain Score: 7  Pain Location: R ankle Pain Descriptors / Indicators: Burning;Aching    Home Living Family/patient expects to be discharged to:: Private residence Living Arrangements: Spouse/significant other Available Help at Discharge: Available 24 hours/day Type of Home: House Home Access: Level entry (1 step to enter threshold)     Home Layout: One level Home Equipment: Walker - 2 wheels      Prior Function Level of Independence: Independent with assistive device(s)         Comments: pt was NWB and not compliant with restrictions due to assisting husband with household chores     Hand Dominance   Dominant Hand: Right    Extremity/Trunk Assessment   Upper Extremity Assessment Upper Extremity Assessment: Defer to OT evaluation    Lower Extremity Assessment Lower Extremity Assessment: RLE deficits/detail RLE Deficits / Details: R in splint with NWB RLE: Unable to fully assess due to immobilization RLE Sensation: WNL RLE Coordination: WNL       Communication   Communication: No difficulties  Cognition Arousal/Alertness: Awake/alert Behavior During Therapy: WFL for tasks assessed/performed Overall Cognitive Status: Within Functional Limits for tasks assessed  General Comments      Exercises General Exercises - Lower Extremity Heel Slides: AROM;Strengthening;Both;10 reps;Other (comment) (in recliner; knee to chest) Hip ABduction/ADduction: AROM;Strengthening;Both;10 reps;Other (comment) (in recliner) Straight Leg Raises: AROM;Strengthening;Both;10 reps;Other (comment) (in recliner)   Assessment/Plan    PT Assessment Patient needs continued PT services  PT Problem List Decreased mobility;Decreased coordination;Decreased balance;Decreased knowledge of use of  DME;Decreased activity tolerance       PT Treatment Interventions DME instruction;Therapeutic exercise;Gait training;Balance training;Therapeutic activities;Patient/family education    PT Goals (Current goals can be found in the Care Plan section)  Acute Rehab PT Goals Patient Stated Goal: To get stronger PT Goal Formulation: With patient Time For Goal Achievement: 08/14/20 Potential to Achieve Goals: Good    Frequency Min 3X/week   Barriers to discharge        Co-evaluation               AM-PAC PT "6 Clicks" Mobility  Outcome Measure Help needed turning from your back to your side while in a flat bed without using bedrails?: None Help needed moving from lying on your back to sitting on the side of a flat bed without using bedrails?: None Help needed moving to and from a bed to a chair (including a wheelchair)?: A Little Help needed standing up from a chair using your arms (e.g., wheelchair or bedside chair)?: A Lot Help needed to walk in hospital room?: A Little Help needed climbing 3-5 steps with a railing? : A Lot 6 Click Score: 18    End of Session Equipment Utilized During Treatment: Gait belt Activity Tolerance: Patient tolerated treatment well Patient left: in chair;with chair alarm set;with call bell/phone within reach Nurse Communication: Mobility status PT Visit Diagnosis: Unsteadiness on feet (R26.81);Other abnormalities of gait and mobility (R26.89)    Time: 6734-1937 PT Time Calculation (min) (ACUTE ONLY): 25 min   Charges:   PT Evaluation $PT Eval Low Complexity: 1 Low PT Treatments $Therapeutic Exercise: 8-22 mins        Lyanne Co, DPT Acute Rehabilitation Services [phone]  [place] 07/31/2020, 12:16 PM

## 2020-07-31 NOTE — Discharge Instructions (Addendum)
Orthopaedic Trauma Service Discharge Instructions   General Discharge Instructions  Orthopaedic Injuries:  Right ankle fracture dislocation treated with tibiotalocalcaneal fusion   WEIGHT BEARING STATUS: nonweightbearing right leg. Do not put any weight through right leg   RANGE OF MOTION/ACTIVITY: ok to move toes on right foot. Ok to move right knee. Do not remove splint  Bone health: vitamin d levels look ok. Recommend bone density scan in 2-3 months.   Wound Care: do not remove splint. Make sure prevena (wound vac) has full charge whenever possible. Recommend plugging it in at night. Call office if there are any concerns   DVT/PE prophylaxis: continue home xarelto regimen   Diet: as you were eating previously.  Can use over the counter stool softeners and bowel preparations, such as Miralax, to help with bowel movements.  Narcotics can be constipating.  Be sure to drink plenty of fluids  PAIN MEDICATION USE AND EXPECTATIONS  You have likely been given narcotic medications to help control your pain.  After a traumatic event that results in an fracture (broken bone) with or without surgery, it is ok to use narcotic pain medications to help control one's pain.  We understand that everyone responds to pain differently and each individual patient will be evaluated on a regular basis for the continued need for narcotic medications. Ideally, narcotic medication use should last no more than 6-8 weeks (coinciding with fracture healing).   As a patient it is your responsibility as well to monitor narcotic medication use and report the amount and frequency you use these medications when you come to your office visit.   We would also advise that if you are using narcotic medications, you should take a dose prior to therapy to maximize you participation.  IF YOU ARE ON NARCOTIC MEDICATIONS IT IS NOT PERMISSIBLE TO OPERATE A MOTOR VEHICLE (MOTORCYCLE/CAR/TRUCK/MOPED) OR HEAVY MACHINERY DO NOT MIX  NARCOTICS WITH OTHER CNS (CENTRAL NERVOUS SYSTEM) DEPRESSANTS SUCH AS ALCOHOL   STOP SMOKING OR USING NICOTINE PRODUCTS!!!!  As discussed nicotine severely impairs your body's ability to heal surgical and traumatic wounds but also impairs bone healing.  Wounds and bone heal by forming microscopic blood vessels (angiogenesis) and nicotine is a vasoconstrictor (essentially, shrinks blood vessels).  Therefore, if vasoconstriction occurs to these microscopic blood vessels they essentially disappear and are unable to deliver necessary nutrients to the healing tissue.  This is one modifiable factor that you can do to dramatically increase your chances of healing your injury.    (This means no smoking, no nicotine gum, patches, etc)  DO NOT USE NONSTEROIDAL ANTI-INFLAMMATORY DRUGS (NSAID'S)  Using products such as Advil (ibuprofen), Aleve (naproxen), Motrin (ibuprofen) for additional pain control during fracture healing can delay and/or prevent the healing response.  If you would like to take over the counter (OTC) medication, Tylenol (acetaminophen) is ok.  However, some narcotic medications that are given for pain control contain acetaminophen as well. Therefore, you should not exceed more than 4000 mg of tylenol in a day if you do not have liver disease.  Also note that there are may OTC medicines, such as cold medicines and allergy medicines that my contain tylenol as well.  If you have any questions about medications and/or interactions please ask your doctor/PA or your pharmacist.      ICE AND ELEVATE INJURED/OPERATIVE EXTREMITY  Using ice and elevating the injured extremity above your heart can help with swelling and pain control.  Icing in a pulsatile fashion, such as  20 minutes on and 20 minutes off, can be followed.    Do not place ice directly on skin. Make sure there is a barrier between to skin and the ice pack.    Using frozen items such as frozen peas works well as the conform nicely to the are  that needs to be iced.  USE AN ACE WRAP OR TED HOSE FOR SWELLING CONTROL  In addition to icing and elevation, Ace wraps or TED hose are used to help limit and resolve swelling.  It is recommended to use Ace wraps or TED hose until you are informed to stop.    When using Ace Wraps start the wrapping distally (farthest away from the body) and wrap proximally (closer to the body)   Example: If you had surgery on your leg or thing and you do not have a splint on, start the ace wrap at the toes and work your way up to the thigh        If you had surgery on your upper extremity and do not have a splint on, start the ace wrap at your fingers and work your way up to the upper arm  IF YOU ARE IN A SPLINT OR CAST DO NOT [place]   If your splint gets wet for any reason please contact the office immediately. You may shower in your splint or cast as long as you keep it dry.  This can be done by wrapping in a cast cover or garbage back (or similar)  Do Not stick any thing down your splint or cast such as pencils, money, or hangers to try and scratch yourself with.  If you feel itchy take benadryl as prescribed on the bottle for itching  IF YOU ARE IN A CAM BOOT (BLACK BOOT)  You may remove boot periodically. Perform daily dressing changes as noted below.  Wash the liner of the boot regularly and wear a sock when wearing the boot. It is recommended that you sleep in the boot until told otherwise    Call office for the following:  Temperature greater than 101F  Persistent nausea and vomiting  Severe uncontrolled pain  Redness, tenderness, or signs of infection (pain, swelling, redness, odor or green/yellow discharge around the site)  Difficulty breathing, headache or visual disturbances  Hives  Persistent dizziness or light-headedness  Extreme fatigue  Any other questions or concerns you may have after discharge  In an emergency, call 911 or go to an Emergency Department at a  nearby hospital  HELPFUL INFORMATION  ? If you had a block, it will wear off between 8-24 hrs postop typically.  This is period when your pain may go from nearly zero to the pain you would have had postop without the block.  This is an abrupt transition but nothing dangerous is happening.  You may take an extra dose of narcotic when this happens.  ? You should wean off your narcotic medicines as soon as you are able.  Most patients will be off or using minimal narcotics before their first postop appointment.   ? We suggest you use the pain medication the first night prior to going to bed, in order to ease any pain when the anesthesia wears off. You should avoid taking pain medications on an empty stomach as it will make you nauseous.  ? Do not drink alcoholic beverages or take illicit drugs when taking pain medications.  ? In most states it is against the  law to drive while you are in a splint or sling.  And certainly against the law to drive while taking narcotics.  ? You may return to work/school in the next couple of days when you feel up to it.   ? Pain medication may make you constipated.  Below are a few solutions to try in this order: - Decrease the amount of pain medication if you arent having pain. - Drink lots of decaffeinated fluids. - Drink prune juice and/or each dried prunes  o If the first 3 dont work start with additional solutions - Take Colace - an over-the-counter stool softener - Take Senokot - an over-the-counter laxative - Take Miralax - a stronger over-the-counter laxative     CALL THE OFFICE WITH ANY QUESTIONS OR CONCERNS: [phone]   VISIT OUR WEBSITE FOR ADDITIONAL INFORMATION: [email or web address]     Information on my medicine - XARELTO (Rivaroxaban)  This medication education was reviewed with me or my healthcare representative as part of my discharge preparation.    Why was Xarelto prescribed for you? Xarelto was prescribed for you to reduce  the risk of a blood clot forming that can cause a stroke if you have a medical condition called atrial fibrillation (a type of irregular heartbeat).  What do you need to know about xarelto ? Take your Xarelto ONCE DAILY at the same time every day with your evening meal. If you have difficulty swallowing the tablet whole, you may crush it and mix in applesauce just prior to taking your dose.  Take Xarelto exactly as prescribed by your doctor and DO NOT stop taking Xarelto without talking to the doctor who prescribed the medication.  Stopping without other stroke prevention medication to take the place of Xarelto may increase your risk of developing a clot that causes a stroke.  Refill your prescription before you run out.  After discharge, you should have regular check-up appointments with your healthcare provider that is prescribing your Xarelto.  In the future your dose may need to be changed if your kidney function or weight changes by a significant amount.  What do you do if you miss a dose? If you are taking Xarelto ONCE DAILY and you miss a dose, take it as soon as you remember on the same day then continue your regularly scheduled once daily regimen the next day. Do not take two doses of Xarelto at the same time or on the same day.   Important Safety Information A possible side effect of Xarelto is bleeding. You should call your healthcare provider right away if you experience any of the following: ? Bleeding from an injury or your nose that does not stop. ? Unusual colored urine (red or dark brown) or unusual colored stools (red or black). ? Unusual bruising for unknown reasons. ? A serious fall or if you hit your head (even if there is no bleeding).  Some medicines may interact with Xarelto and might increase your risk of bleeding while on Xarelto. To help avoid this, consult your healthcare provider or pharmacist prior to using any new prescription or non-prescription  medications, including herbals, vitamins, non-steroidal anti-inflammatory drugs (NSAIDs) and supplements.  This website has more information on Xarelto: [email or web address]

## 2020-07-31 NOTE — Progress Notes (Signed)
Inpatient Diabetes Program Recommendations  AACE/ADA: New Consensus Statement on Inpatient Glycemic Control (2015)  Target Ranges:  Prepandial:   less than 140 mg/dL      Peak postprandial:   less than 180 mg/dL (1-2 hours)      Critically ill patients:  140 - 180 mg/dL   Lab Results  Component Value Date   GLUCAP 318 (H) 07/31/2020   HGBA1C 7.7 (H) 07/30/2020    Review of Glycemic Control Results for Michaela Johnson, Michaela Johnson (MRN [id number]) as of 07/31/2020 13:24  Ref. Range 07/30/2020 12:36 07/30/2020 16:41 07/30/2020 20:13 07/31/2020 07:31 07/31/2020 12:23  Glucose-Capillary Latest Ref Range: 70 - 99 mg/dL 166 (H) 398 (H) 350 (H) 350 (H) 318 (H)  Diabetes history: DM 2 Outpatient Diabetes medications:  Lantus 46 units q AM, Novolog 4 units tid with meals Current orders for Inpatient glycemic control:  Lantus 30 units q HS, Novolog 4 units tid with meals, Novolog moderate tid with meals Inpatient Diabetes Program Recommendations:   Please increase Lantus to 46 units q HS.   Thanks,  Adah Perl, RN, BC-ADM Inpatient Diabetes Coordinator Pager [phone] (8a-5p)

## 2020-07-31 NOTE — Progress Notes (Signed)
ANTICOAGULATION CONSULT NOTE - Initial Consult  Pharmacy Consult:  Fracture care post-op AC Indication: AFib  Allergies  Allergen Reactions  . Codeine Itching  . Levaquin [Levofloxacin In D5w] Itching    Patient Measurements: Height: 5\' 2"  (157.5 cm) Weight: 68 kg (150 lb) IBW/kg (Calculated) : 50.1  Vital Signs: Temp: 97.8 F (36.6 C) (01/11 0430) Temp Source: Oral (01/11 0430) BP: 140/77 (01/11 0430) Pulse Rate: 82 (01/11 0430)  Labs: Recent Labs    07/30/20 0555 07/30/20 1426 07/31/20 0224  HGB 13.3 11.5* 9.7*  HCT 40.0 35.4* 27.7*  PLT 315 295 288  APTT 29  --   --   LABPROT 13.0  --   --   INR 1.0  --   --   CREATININE 0.67 0.69  --     Estimated Creatinine Clearance: 66.8 mL/min (by C-G formula based on SCr of 0.69 mg/dL).   Medical History: Past Medical History:  Diagnosis Date  . Alcohol use   . Anxiety   . Arrhythmia   . Arthritis   . Constipation   . Depression   . Diabetes mellitus without complication ([place])   . Dysrhythmia     PAF  . GERD (gastroesophageal reflux disease)   . Hypertension   . Neuropathy   . PAF (paroxysmal atrial fibrillation) ([place])   . Panic attacks   . Pneumonia   . Tobacco use   . Vitamin D deficiency     Assessment: 7 YOF with history of Afib on Xarelto PTA.  Patient is here for hardware removal of right ankle, ankle fusion and application of wound VAC on 07/31/19.  Pharmacy consulted to resume Xarelto on POD#1 if hemoglobin is >/= 8 g/dL and patient does not require transfusion.  Patient meets criteria to resume med.  SCr 0.69, CrCL > 50 ml/min - home Xarelto dose appropriate  Goal of Therapy:  Appropriate anticoagulation Monitor platelets by anticoagulation protocol: Yes   Plan:   Resume Xarelto 20mg  PO daily with supper Pharmacy will sign off  Mandeep Ferch D. Mina Marble, PharmD, BCPS, [place] 07/31/2020, 7:05 AM

## 2020-07-31 NOTE — Evaluation (Signed)
Occupational Therapy Evaluation Patient Details Name: Michaela Johnson MRN: [id number] DOB: [birth year] Today's Date: 07/31/2020    History of Present Illness 60 y/o female s/p R tibiotalocalcalcaneal fusion for loss of reduction of ORIF R ankle fracture dislocation   Clinical Impression   Pt presents with decline in function and safety with ADLs and ADL mobility with impaired balance and endurance.  Prior to ankle injury pt Ind with ADLs/selfcare and ADL mobility without AD, since pt has had difficult time maintaining NWB due to assisting husband around the house. Pt with functional impairments listed below and would benefit from acute OT services to address impairments to maximize level of function and safety    Follow Up Recommendations  SNF    Equipment Recommendations  Other (comment) (TBD at next venue of care)    Recommendations for Other Services       Precautions / Restrictions Precautions Precautions: Fall Restrictions Weight Bearing Restrictions: Yes RLE Weight Bearing: Non weight bearing      Mobility Bed Mobility Overal bed mobility: Modified Independent             General bed mobility comments: pt in recliner upon arrival    Transfers Overall transfer level: Needs assistance Equipment used: Rolling walker (2 wheeled) Transfers: Sit to/from Stand Sit to Stand: Mod assist         General transfer comment: mod A needed to power up to maintain NWB on RLE    Balance Overall balance assessment: Needs assistance Sitting-balance support: Bilateral upper extremity supported Sitting balance-Leahy Scale: Good     Standing balance support: Bilateral upper extremity supported Standing balance-Leahy Scale: Poor                             ADL either performed or assessed with clinical judgement   ADL Overall ADL's : Needs assistance/impaired Eating/Feeding: Independent;Sitting   Grooming: Wash/dry hands;Wash/dry face;Min guard;Standing    Upper Body Bathing: Set up;Sitting   Lower Body Bathing: Moderate assistance;Sitting/lateral leans   Upper Body Dressing : Set up;Sitting   Lower Body Dressing: Moderate assistance;Sitting/lateral leans   Toilet Transfer: Moderate assistance;Ambulation;RW;BSC;Cueing for safety   Toileting- Clothing Manipulation and Hygiene: Moderate assistance;Sit to/from stand       Functional mobility during ADLs: Moderate assistance;Rolling walker;Cueing for safety       Vision Baseline Vision/History: Wears glasses Patient Visual Report: No change from baseline       Perception     Praxis      Pertinent Vitals/Pain Pain Assessment: 0-10 Pain Score: 7  Pain Location: R ankle Pain Descriptors / Indicators: Burning;Aching Pain Intervention(s): Monitored during session;Repositioned     Hand Dominance Right   Extremity/Trunk Assessment Upper Extremity Assessment Upper Extremity Assessment: Overall WFL for tasks assessed   Lower Extremity Assessment Lower Extremity Assessment: Defer to PT evaluation RLE Deficits / Details: R in splint with NWB RLE: Unable to fully assess due to immobilization RLE Sensation: WNL RLE Coordination: WNL   Cervical / Trunk Assessment Cervical / Trunk Assessment: Normal   Communication Communication Communication: No difficulties   Cognition Arousal/Alertness: Awake/alert Behavior During Therapy: WFL for tasks assessed/performed Overall Cognitive Status: Within Functional Limits for tasks assessed                                     General Comments       Exercises General  Exercises - Lower Extremity Heel Slides: AROM;Strengthening;Both;10 reps;Other (comment) (in recliner; knee to chest) Hip ABduction/ADduction: AROM;Strengthening;Both;10 reps;Other (comment) (in recliner) Straight Leg Raises: AROM;Strengthening;Both;10 reps;Other (comment) (in recliner)   Shoulder Instructions      Home Living Family/patient expects to  be discharged to:: Private residence Living Arrangements: Spouse/significant other Available Help at Discharge: Available 24 hours/day Type of Home: House Home Access: Level entry     [place]: One level     Bathroom Shower/Tub: Teacher, early years/pre: Standard Bathroom Accessibility: Yes   Home Equipment: Environmental consultant - 2 wheels          Prior Functioning/Environment Level of Independence: Independent with assistive device(s)        Comments: pt was NWB and non compliant with restrictions due to assisting husband with household chores        OT Problem List: Impaired balance (sitting and/or standing);Decreased coordination;Decreased activity tolerance;Decreased safety awareness;Pain      OT Treatment/Interventions:      OT Goals(Current goals can be found in the care plan section) Acute Rehab OT Goals Patient Stated Goal: To get stronger OT Goal Formulation: With patient Time For Goal Achievement: 08/14/20 Potential to Achieve Goals: Good ADL Goals Pt Will Perform Grooming: with supervision;with set-up;standing Pt Will Perform Lower Body Bathing: with min assist;sitting/lateral leans;sit to/from stand Pt Will Perform Lower Body Dressing: with min assist;sitting/lateral leans;sit to/from stand Pt Will Transfer to Toilet: with min assist;with min guard assist;ambulating;stand pivot transfer;bedside commode;regular height toilet;grab bars Pt Will Perform Toileting - Clothing Manipulation and hygiene: with min assist;with min guard assist;sit to/from stand  OT Frequency: Min 2X/week   Barriers to D/C:            Co-evaluation              AM-PAC OT "6 Clicks" Daily Activity     Outcome Measure Help from another person eating meals?: None Help from another person taking care of personal grooming?: A Little Help from another person toileting, which includes using toliet, bedpan, or urinal?: A Lot Help from another person bathing (including washing,  rinsing, drying)?: A Lot Help from another person to put on and taking off regular upper body clothing?: None Help from another person to put on and taking off regular lower body clothing?: A Lot 6 Click Score: 17   End of Session Equipment Utilized During Treatment: Gait belt;Rolling walker;Other (comment) (BSC)  Activity Tolerance: Patient tolerated treatment well Patient left: in chair;with call bell/phone within reach;with chair alarm set  OT Visit Diagnosis: Unsteadiness on feet (R26.81);Other abnormalities of gait and mobility (R26.89);History of falling (Z91.81);Pain Pain - Right/Left: Right Pain - part of body: Ankle and joints of foot                Time: 1205-1229 OT Time Calculation (min): 24 min Charges:  OT General Charges $OT Visit: 1 Visit OT Evaluation $OT Eval Low Complexity: 1 Low OT Treatments $Self Care/Home Management : 8-22 mins    Emmit Alexanders [facility] 07/31/2020, 3:05 PM

## 2020-08-01 LAB — CBC
HCT: 27.6 % — ABNORMAL LOW (ref 36.0–46.0)
Hemoglobin: 9.4 g/dL — ABNORMAL LOW (ref 12.0–15.0)
MCH: 33.2 pg (ref 26.0–34.0)
MCHC: 34.1 g/dL (ref 30.0–36.0)
MCV: 97.5 fL (ref 80.0–100.0)
Platelets: 287 10*3/uL (ref 150–400)
RBC: 2.83 MIL/uL — ABNORMAL LOW (ref 3.87–5.11)
RDW: 12.9 % (ref 11.5–15.5)
WBC: 13.5 10*3/uL — ABNORMAL HIGH (ref 4.0–10.5)
nRBC: 0 % (ref 0.0–0.2)

## 2020-08-01 LAB — URINE CULTURE: Culture: <10000 — AB

## 2020-08-01 LAB — GLUCOSE, CAPILLARY
Glucose-Capillary: 278 mg/dL — ABNORMAL HIGH (ref 70–99)
Glucose-Capillary: 232 mg/dL — ABNORMAL HIGH (ref 70–99)
Glucose-Capillary: 185 mg/dL — ABNORMAL HIGH (ref 70–99)
Glucose-Capillary: 221 mg/dL — ABNORMAL HIGH (ref 70–99)

## 2020-08-01 MED ORDER — SODIUM CHLORIDE 0.9 % IV SOLN
2.0000 g | INTRAVENOUS | Status: DC
  Administered 2020-08-01 – 2020-08-02 (×2): 2 g via INTRAVENOUS
  Filled 2020-08-01 (×2): qty 20

## 2020-08-01 MED ORDER — CIPROFLOXACIN HCL 500 MG PO TABS: 500.0000 mg | ORAL_TABLET | Freq: Two times a day (BID) | ORAL | Status: DC

## 2020-08-01 NOTE — Progress Notes (Signed)
Patient ID: Michaela Johnson, female   DOB: 11/02/1960, 60 y.o.   MRN: [id number]   LOS: 2 days   Subjective: Doing well, no c/o. Ready to go to SNF.   Objective: Vital signs in last 24 hours: Temp:  [98 F (36.7 C)-99 F (37.2 C)] 98.2 F (36.8 C) (01/12 0728) Pulse Rate:  [73-88] 73 (01/12 1011) Resp:  [15-18] 16 (01/12 1011) BP: (116-148)/(57-82) 117/57 (01/12 1011) SpO2:  [94 %-99 %] 97 % (01/12 0728) Last BM Date: 07/30/20   Laboratory  CBC Recent Labs    07/31/20 0224 08/01/20 0150  WBC 12.5* 13.5*  HGB 9.7* 9.4*  HCT 27.7* 27.6*  PLT 288 287   BMET Recent Labs    07/30/20 0555 07/30/20 1426  NA 137 134*  K 3.9 4.2  CL 99 99  CO2 26 21*  GLUCOSE 101* 225*  BUN 9 11  CREATININE 0.67 0.69  CALCIUM 9.3 8.5*     Physical Exam General appearance: alert and no distress  RLE: Splint in place, able to move toes, sensation baseline   Assessment/Plan: S/p right ankle fusion -- D/C to SNF when able    Lisette Abu, PA-C Orthopedic Surgery [phone] 08/01/2020

## 2020-08-01 NOTE — Plan of Care (Signed)

## 2020-08-01 NOTE — Plan of Care (Signed)

## 2020-08-01 NOTE — TOC Initial Note (Signed)
Transition of Care [facility]) - Initial/Assessment Note    Patient Details  Name: Michaela Johnson MRN: [id number] Date of Birth: [birth year]  Transition of Care [facility]) CM/SW Contact:    Sharin Mons, RN Phone Number: 08/01/2020, 5:00 PM  Clinical Narrative:                  NCM spoke with pt regarding d/c planning . Shared PT recommendations for SNF placement. Pt without insurance and would like SNF facility in [place].  NCM explained slim chance of receiving charity care with SNFs and definitely couldn't guarantee a facility in [place].  Pt stated she would prefer to go home and husband can assist with her needs.  Referral made with [facility] for charity care PT .... APPROVAL PENDING.   TOC TEAM WILL CONTINUE TO MONITOR AND FOLLOW FOR NEEDS.....  Expected Discharge Plan: [place] Barriers to Discharge: Continued Medical Work up   Patient Goals and CMS Choice     Choice offered to / list presented to : Patient  Expected Discharge Plan and Services Expected Discharge Plan: [place] In-house Referral: Development worker, community Discharge Planning Services: CM Consult                               HH Arranged: PT [facility] Agency: [place] (CHARITY CASE) Date [facility] Agency Contacted: 08/01/20 Time [place]: [place] Representative spoke with at [place]: Via text , determination pending for charity approval  Prior Living Arrangements/Services     Patient language and need for interpreter reviewed:: Yes Do you feel safe going back to the place where you live?: Yes      Need for Family Participation in Patient Care: Yes (Comment) Care giver support system in place?: Yes (comment)   Criminal Activity/Legal Involvement Pertinent to Current Situation/Hospitalization: No - Comment as needed  Activities of Daily Living Home Assistive Devices/Equipment: Crutches,Eyeglasses,Wheelchair ADL Screening (condition at time of  admission) Patient's cognitive ability adequate to safely complete daily activities?: Yes Is the patient deaf or have difficulty hearing?: No Does the patient have difficulty seeing, even when wearing glasses/contacts?: No Does the patient have difficulty concentrating, remembering, or making decisions?: No Patient able to express need for assistance with ADLs?: Yes Does the patient have difficulty dressing or bathing?: No Independently performs ADLs?: Yes (appropriate for developmental age) Does the patient have difficulty walking or climbing stairs?: Yes Weakness of Legs: Right Weakness of Arms/Hands: None  Permission Sought/Granted                  Emotional Assessment   Attitude/Demeanor/Rapport: Engaged Affect (typically observed): Accepting Orientation: : Oriented to Self,Oriented to Place,Oriented to  Time,Oriented to Situation Alcohol / Substance Use: Not Applicable Psych Involvement: No (comment)  Admission diagnosis:  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 Chronic anticoagulation 07/31/2020  . Chronic alcohol use 07/31/2020  . Nicotine dependence 07/31/2020  . 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 Substance abuse ([place]) 03/18/2018  . Diabetes ([place]) 03/18/2018  . H/O medication noncompliance 02/07/2018  . PAF (paroxysmal atrial fibrillation) ([place]) 01/30/2018  . Atrial fibrillation with rapid ventricular response ([place]) 01/30/2018  . Seizure ([place])   . DKA (diabetic ketoacidoses) 06/07/2016   PCP:  [name], NP Pharmacy:   [id number] - [place] -  [address] [place] [address] Phone: [phone] Fax: [phone]     Social Determinants of Health (SDOH) Interventions    Readmission Risk Interventions No flowsheet data found.

## 2020-08-02 LAB — GLUCOSE, CAPILLARY
Glucose-Capillary: 190 mg/dL — ABNORMAL HIGH (ref 70–99)
Glucose-Capillary: 171 mg/dL — ABNORMAL HIGH (ref 70–99)
Glucose-Capillary: 184 mg/dL — ABNORMAL HIGH (ref 70–99)
Glucose-Capillary: 156 mg/dL — ABNORMAL HIGH (ref 70–99)

## 2020-08-02 NOTE — TOC Progression Note (Addendum)
Transition of Care [facility]) - Progression Note    Patient Details  Name: Michaela Johnson MRN: [id number] Date of Birth: 1961-05-31  Transition of Care [facility]) CM/SW Contact  Sharin Mons, RN Phone Number: 08/02/2020, 3:02 PM  Clinical Narrative:     - s/p HARDWARE REMOVAL RIGHT ANKLE, ANKLE FUSION  APPLICATION OF WOUND VAC (Right Ankle) 1/10  NCM received call from [facility]. [place] made NCM pt couldn't received charity HH services 2/2 monthly household income( $3,500.00) and pt states has $ 220,000 in savings. Pt willing to privately pay for [facility] services , however, [facility] doesn't offer private pay services. Referral  made with [facility] for home health services ( PT, SW). Pt made aware of private pay cost ($ 195.00/PT per visit and $ 225.00/SW per visit) and ok with cost. NCM made [facility] liaison aware ... awaiting approval from branch office.  Pt states declined substance abuse resources ... HH SW can readdress and provide resources if needed.  Pt states already has w/c and rolling walker @ home. Order noted for DME: 3 IN 1/ BSC. Nurse to provide to pt from floor stock prior to d/c.  Pt states without transportation to home @ d/c. NCM will assist with safe transportation to home by [employer].  MD please send Rx meds to [place], pt without transportation. Husband doesn't drive.  TOC team will continue to monitor and assist with TOC needs...Marland KitchenMarland KitchenMarland Kitchen  08/03/2020 8:18 AM  NCM received call from [place]. Ramond Marrow informed NCM they can't provide home health services to pt. States they are longer receiving private pay pt's. PA made aware. Referral made with [facility]....determination pending ....   08/03/2020  1002 Call received from [facility]. [facility] declined pt for private pay [facility] services. They don't accept private pay.  08/03/2020 [place] declined pt for [facility] services. They don't accept private pay.    Expected Discharge Plan: [place] Barriers to Discharge: Continued Medical Work up  Expected Discharge Plan and Services Expected Discharge Plan: [place] In-house Referral: Development worker, community Discharge Planning Services: CM Consult                               HH Arranged: PT,Social Work [facility] Agency: [place] ([place]) Date [place]: 08/02/20 Time [place]: 1501 Representative spoke with at [place]: [place] ([place]) Interventions    Readmission Risk Interventions No flowsheet data found.

## 2020-08-03 ENCOUNTER — Other Ambulatory Visit (HOSPITAL_COMMUNITY): Payer: Self-pay | Admitting: Orthopedic Surgery

## 2020-08-03 LAB — GLUCOSE, CAPILLARY
Glucose-Capillary: 140 mg/dL — ABNORMAL HIGH (ref 70–99)
Glucose-Capillary: 332 mg/dL — ABNORMAL HIGH (ref 70–99)

## 2020-08-03 MED ORDER — DOXYCYCLINE HYCLATE 100 MG PO TABS: 100.0000 mg | ORAL_TABLET | Freq: Two times a day (BID) | ORAL | 0 refills | Status: DC

## 2020-08-03 MED ORDER — ADULT MULTIVITAMIN W/MINERALS CH: 1.0000 | ORAL_TABLET | Freq: Every day | ORAL | 3 refills | Status: AC

## 2020-08-03 MED ORDER — DOCUSATE SODIUM 100 MG PO CAPS: 100.0000 mg | ORAL_CAPSULE | Freq: Two times a day (BID) | ORAL | 0 refills | Status: DC

## 2020-08-03 MED ORDER — ACETAMINOPHEN 325 MG PO TABS: 325.0000 mg | ORAL_TABLET | Freq: Four times a day (QID) | ORAL | Status: DC | PRN

## 2020-08-03 MED ORDER — OXYCODONE-ACETAMINOPHEN 5-325 MG PO TABS: 1.0000 | ORAL_TABLET | Freq: Four times a day (QID) | ORAL | 0 refills | Status: DC | PRN

## 2020-08-03 MED ORDER — VITAMIN D 125 MCG (5000 UT) PO CAPS: 1.0000 | ORAL_CAPSULE | Freq: Every day | ORAL | 5 refills | Status: DC

## 2020-08-03 MED ORDER — ASCORBIC ACID 500 MG PO TABS: 500.0000 mg | ORAL_TABLET | Freq: Every day | ORAL | 2 refills | Status: AC

## 2020-08-03 MED ORDER — OXYCODONE-ACETAMINOPHEN 5-325 MG PO TABS
1.0000 | ORAL_TABLET | Freq: Four times a day (QID) | ORAL | 0 refills | Status: DC | PRN
Start: 2020-08-03 — End: 2020-08-03

## 2020-08-03 MED FILL — DOCUSATE SODIUM 100 MG CAPS: 100 | 10 days supply | Qty: 20 | Fill #0

## 2020-08-03 MED FILL — DOXYCYCLINE HYCLATE 100 MG: 100 | 21 days supply | Qty: 42 | Fill #0

## 2020-08-03 MED FILL — ACETAMINOPHEN 325 MG TABS: 325 | 15 days supply | Qty: 60 | Fill #0

## 2020-08-03 MED FILL — VITAMIN D3 5,000 UNIT TAB: 125 MCG | 30 days supply | Qty: 30 | Fill #0

## 2020-08-03 MED FILL — OXYCODONE-APAP 5-325MG: 5-325 | 7 days supply | Qty: 50 | Fill #0

## 2020-08-03 MED FILL — VITAMIN C 500 MG TABLET: 500 | 30 days supply | Qty: 30 | Fill #0

## 2020-08-03 MED FILL — CERTAVITE/ANTIOXIDANTS TABS: 30 days supply | Qty: 30 | Fill #0

## 2020-08-03 NOTE — Progress Notes (Signed)
Physical Therapy Treatment Patient Details Name: Michaela Johnson MRN: [id number] DOB: Apr 13, 1961 Today's Date: 08/03/2020    History of Present Illness 60 y/o female s/p R tibiotalocalcalcaneal fusion for loss of reduction of ORIF R ankle fracture dislocation    PT Comments     Pt up in chair on arrival, agreeable to therapy session with good participation and tolerance for session. Pt quick to fatigue but making progress with functional mobility, able to stand with decreased assist (Supervision) from chair height and with good compliance for NWB RLE during gait trials x2 (15ft) using RW. Pt performed seated/reclined BLE therapeutic exercises with good tolerance as detailed below and given HEP handout (:[email or web address] Access Code: [email or web address]) . Pt unable to perform 4" stair trial, attempted but reports unable to hop high enough to ascend - pt reports she will have assistance to bump her wheelchair over single step to enter home. Pt continues to benefit from PT services to progress toward functional mobility goals. Pt unable to go to SNF due to uninsured, per pt she plans for home and would in that case benefit from HHPT to progress strength/endurance and ensure safe home environment for mobility progression, discussed with supervising PT Michaela Johnson.  Follow Up Recommendations  Home health PT;Supervision for mobility/OOB     Equipment Recommendations  3in1 (PT)    Recommendations for Other Services       Precautions / Restrictions Precautions Precautions: Fall Restrictions Weight Bearing Restrictions: Yes RLE Weight Bearing: Non weight bearing    Mobility  Bed Mobility               General bed mobility comments: pt in recliner upon arrival  Transfers Overall transfer level: Needs assistance Equipment used: Rolling walker (2 wheeled) Transfers: Sit to/from Stand Sit to Stand: Supervision         General transfer comment: from chair<>RW, needs cues for getting  closer to recliner prior to sitting as pt tends to sit when diagonally lined up to chair but no overt LOB and fairly controlled with stand to sit  Ambulation/Gait Ambulation/Gait assistance: Min guard;Supervision Gait Distance (Feet): 20 Feet (x2) Assistive device: Rolling walker (2 wheeled) Gait Pattern/deviations:  (hop-to pattern) Gait velocity: grossly <0.3 m/s   General Gait Details: good compliance with NWB LLE, pt fatigues quickly but no LOB, able to hop and at times pivots L foot when fatigued   Stairs Stairs: Yes Stairs assistance: Total assist   Number of Stairs: 0 General stair comments: pt given verbal/visual demo for 2 techniques to ascend/descend step and ambulated to 4" platform step but once there, pt unable to hop high enough to perform (pt anxious and defers to attempt other that hopping up/down within RW frame; pt reports she will have WC bumped up over single step into home   Wheelchair Mobility    Modified Rankin (Stroke Patients Only)       Balance Overall balance assessment: Needs assistance Sitting-balance support: Bilateral upper extremity supported Sitting balance-Leahy Scale: Good     Standing balance support: Bilateral upper extremity supported Standing balance-Leahy Scale: Poor Standing balance comment: needs BUE support of RW due to NWB status, no LOB during dynamic standing tasks                            Cognition Arousal/Alertness: Awake/alert Behavior During Therapy: WFL for tasks assessed/performed Overall Cognitive Status: Within Functional Limits for tasks assessed  General Comments: pleasantly cooperative, some decreased safety awareness but receptive to instruction      Exercises General Exercises - Upper Extremity Shoulder Flexion:  (verbal/visual demo) Chair Push Up: AROM;Strengthening;Left;10 reps;Seated (NWB on RLE) General Exercises - Lower Extremity Ankle  Circles/Pumps: AROM;Left;10 reps;Supine Quad Sets: AROM;Strengthening;Both;10 reps;Supine Gluteal Sets: AROM;Strengthening;Both;10 reps;Supine Long Arc Quad: AROM;Strengthening;Both;10 reps;Seated Heel Slides:  (verbal/visual demo) Hip ABduction/ADduction:  (verbal/visual demo) Straight Leg Raises:  (verbal/visual demo) Hip Flexion/Marching: AROM;Strengthening;Both;10 reps;Seated    General Comments General comments (skin integrity, edema, etc.): Reviewed positioning/importance of keeping RLE elevated when up in chair, pressure offloading, HEP handout, safety wtih mobility/transfers, use of call bell while admitted      Pertinent Vitals/Pain Pain Assessment: 0-10 Pain Score: 7  Pain Location: R ankle Pain Descriptors / Indicators: Burning;Aching Pain Intervention(s): Monitored during session;Premedicated before session;Repositioned (pt defers ice pack)  SpO2 96-97% on RA and HR 83-99 bpm during mobility/rest.  Pt pulling 1250-1500 on IS x5 reps, encouraged hourly use.  Home Living                      Prior Function            PT Goals (current goals can now be found in the care plan section) Acute Rehab PT Goals Patient Stated Goal: To get stronger and go home PT Goal Formulation: With patient Time For Goal Achievement: 08/14/20 Potential to Achieve Goals: Good Progress towards PT goals: Progressing toward goals    Frequency    Min 3X/week      PT Plan Discharge plan needs to be updated;Equipment recommendations need to be updated    Co-evaluation              AM-PAC PT "6 Clicks" Mobility   Outcome Measure  Help needed turning from your back to your side while in a flat bed without using bedrails?: None Help needed moving from lying on your back to sitting on the side of a flat bed without using bedrails?: None Help needed moving to and from a bed to a chair (including a wheelchair)?: A Little Help needed standing up from a chair using your arms  (e.g., wheelchair or bedside chair)?: A Little Help needed to walk in hospital room?: A Little Help needed climbing 3-5 steps with a railing? : Total 6 Click Score: 18    End of Session Equipment Utilized During Treatment: Gait belt Activity Tolerance: Patient tolerated treatment well Patient left: in chair;with call bell/phone within reach Nurse Communication: Mobility status (pt reporting she wants to DC) PT Visit Diagnosis: Unsteadiness on feet (R26.81);Other abnormalities of gait and mobility (R26.89)     Time: 2956-2130 PT Time Calculation (min) (ACUTE ONLY): 32 min  Charges:  $Gait Training: 8-22 mins $Therapeutic Exercise: 8-22 mins                     Adelis Docter P., PTA Acute Rehabilitation Services Pager: [phone] Office: [place] 08/03/2020, 10:31 AM

## 2020-08-03 NOTE — Progress Notes (Signed)
Patient ID: Michaela Johnson, female   DOB: 27-Jul-1960, 60 y.o.   MRN: [id number]   LOS: 4 days   Subjective: Doing well, no c/o. Insisting on going home.   Objective: Vital signs in last 24 hours: Temp:  [98 F (36.7 C)-98.7 F (37.1 C)] 98.7 F (37.1 C) (01/14 0819) Pulse Rate:  [72-82] 80 (01/14 0819) Resp:  [15-20] 18 (01/14 0819) BP: (113-153)/(61-78) 153/78 (01/14 0819) SpO2:  [94 %-96 %] 95 % (01/14 0819) Last BM Date: 08/03/19   Laboratory  CBC Recent Labs    08/01/20 0150  WBC 13.5*  HGB 9.4*  HCT 27.6*  PLT 287   BMET No results for input(s): NA, K, CL, CO2, GLUCOSE, BUN, CREATININE, CALCIUM in the last 72 hours.   Physical Exam General appearance: alert and no distress  RLE: Splint in place, able to move toes, sensation baseline   Assessment/Plan: S/p right ankle fusion -- Ok to d/c home. D/C [place] before discharge.    Lisette Abu, PA-C Orthopedic Surgery [phone] 08/03/2020

## 2020-08-03 NOTE — Progress Notes (Signed)
Occupational Therapy Treatment Patient Details Name: Michaela Johnson MRN: [id number] DOB: 07-08-1961 Today's Date: 08/03/2020    History of present illness 60 y/o female s/p R tibiotalocalcalcaneal fusion for loss of reduction of ORIF R ankle fracture dislocation   OT comments  Pt. Seen for skilled OT.  Able to complete bsc transfer and LB dressing demo with S/min guard a.  Reports she will have assistance available from husband and best friend upon d/c home.  Follow Up Recommendations  SNF    Equipment Recommendations  Other (comment)    Recommendations for Other Services      Precautions / Restrictions Precautions Precautions: Fall Restrictions Weight Bearing Restrictions: Yes RLE Weight Bearing: Non weight bearing       Mobility Bed Mobility               General bed mobility comments: pt in recliner upon arrival  Transfers Overall transfer level: Needs assistance Equipment used: Rolling walker (2 wheeled) Transfers: Sit to/from Stand Sit to Stand: Supervision         General transfer comment: from chair<>RW, needs cues for getting closer to recliner prior to sitting as pt tends to sit when diagonally lined up to chair but no overt LOB and fairly controlled with stand to sit    Balance Overall balance assessment: Needs assistance Sitting-balance support: Bilateral upper extremity supported Sitting balance-Leahy Scale: Good     Standing balance support: Bilateral upper extremity supported Standing balance-Leahy Scale: Poor Standing balance comment: needs BUE support of RW due to NWB status, no LOB during dynamic standing tasks                           ADL either performed or assessed with clinical judgement   ADL Overall ADL's : Needs assistance/impaired                     Lower Body Dressing: Supervision/safety;Sitting/lateral leans Lower Body Dressing Details (indicate cue type and reason): able to reach b les without  assistance Toilet Transfer: Min guard;BSC;Squat-pivot   Toileting- Clothing Manipulation and Hygiene: Supervision/safety;Sitting/lateral lean Toileting - Clothing Manipulation Details (indicate cue type and reason): pt. simulated how she would reach the front and back portions while seated on bsc       General ADL Comments: pt. able to demo lb dressing and bsc transfer-reports she will be sleeping in recliner at home and plans to have bsc beside recliner during the day. states her bestfriend and husband are available to assist as needed     Vision       Perception     Praxis      Cognition Arousal/Alertness: Awake/alert Behavior During Therapy: WFL for tasks assessed/performed Overall Cognitive Status: Within Functional Limits for tasks assessed                                 General Comments: pleasantly cooperative, some decreased safety awareness but receptive to instruction        Exercises Exercises: General Lower Extremity;General Upper Extremity General Exercises - Upper Extremity Shoulder Flexion:  (verbal/visual demo) Chair Push Up: AROM;Strengthening;Left;10 reps;Seated (NWB on RLE) General Exercises - Lower Extremity Ankle Circles/Pumps: AROM;Left;10 reps;Supine Quad Sets: AROM;Strengthening;Both;10 reps;Supine Gluteal Sets: AROM;Strengthening;Both;10 reps;Supine Long Arc Quad: AROM;Strengthening;Both;10 reps;Seated Heel Slides:  (verbal/visual demo) Hip ABduction/ADduction:  (verbal/visual demo) Straight Leg Raises:  (verbal/visual demo) Hip Flexion/Marching: AROM;Strengthening;Both;10 reps;Seated  Shoulder Instructions       General Comments Reviewed positioning/importance of keeping RLE elevated when up in chair, pressure offloading, HEP handout, safety wtih mobility/transfers, use of call bell while admitted    Pertinent Vitals/ Pain       Pain Assessment: No/denies pain Pain Score: 7  Pain Location: R ankle Pain Descriptors /  Indicators: Burning;Aching Pain Intervention(s): Monitored during session;Premedicated before session;Repositioned (pt defers ice pack)  Home Living                                          Prior Functioning/Environment              Frequency  Min 2X/week        Progress Toward Goals  OT Goals(current goals can now be found in the care plan section)  Progress towards OT goals: Progressing toward goals  Acute Rehab OT Goals Patient Stated Goal: To get stronger and go home  Plan      Co-evaluation                 AM-PAC OT "6 Clicks" Daily Activity     Outcome Measure   Help from another person eating meals?: None Help from another person taking care of personal grooming?: A Little Help from another person toileting, which includes using toliet, bedpan, or urinal?: A Lot Help from another person bathing (including washing, rinsing, drying)?: A Lot Help from another person to put on and taking off regular upper body clothing?: None Help from another person to put on and taking off regular lower body clothing?: A Lot 6 Click Score: 17    End of Session Equipment Utilized During Treatment: Rolling walker;Gait belt  OT Visit Diagnosis: Unsteadiness on feet (R26.81);Other abnormalities of gait and mobility (R26.89);History of falling (Z91.81);Pain Pain - Right/Left: Right Pain - part of body: Ankle and joints of foot   Activity Tolerance Patient tolerated treatment well   Patient Left in chair;with call bell/phone within reach   Nurse Communication          Time: 1142-1150 OT Time Calculation (min): 8 min  Charges: OT General Charges $OT Visit: 1 Visit OT Treatments $Self Care/Home Management : 8-22 mins  Sonia Baller, COTA/L Acute Rehabilitation [phone]   Janice Coffin 08/03/2020, 1:49 PM

## 2020-08-03 NOTE — Plan of Care (Signed)
  Problem: Activity: Goal: Risk for activity intolerance will decrease Outcome: Progressing   Problem: Pain Managment: Goal: General experience of comfort will improve Outcome: Progressing   Problem: Safety: Goal: Ability to remain free from injury will improve Outcome: Progressing   

## 2020-08-03 NOTE — Progress Notes (Signed)
Pt given discharge instructions and gone over with her. She verbalized understanding. Pt given 3n1 from floor supply. Medications coming from [place]. Pt shown how to work prevena wound vac and plug it in to charge. She verbalized understanding of wound vac. All belongings gathered to be sent home. Pt's friend picking her up.

## 2020-08-04 LAB — AEROBIC/ANAEROBIC CULTURE W GRAM STAIN (SURGICAL/DEEP WOUND)

## 2020-08-06 NOTE — Op Note (Signed)
NAMEEIMAN, MARET MEDICAL RECORD HB:[id number] ACCOUNT [id number] DATE OF BIRTH:05/30/1961 FACILITY: [facility] LOCATION: [facility] PHYSICIAN:Alby Schwabe H. Brance Dartt, MD  OPERATIVE REPORT  DATE OF PROCEDURE:  07/30/2020  PREOPERATIVE DIAGNOSES:   1.  Right ankle trimalleolar fracture. 2.  Ruptured syndesmosis. 3.  Recurrent dislocation, right ankle.  POSTOPERATIVE DIAGNOSES: 1.  Right ankle trimalleolar fracture. 2.  Ruptured syndesmosis. 3.  Recurrent dislocation, right ankle.  PROCEDURES: 1.  Right ankle arthrodesis using a Biomet 10 x 180 mm fusion nail. 2.  Right tibial osteotomy. 3.  Removal of deep implant, right tibia and fibula. 4.  Manual application of stress under fluoroscopy. 5.  Application of a small wound VAC.  SURGEON:  Altamese Halliday, MD  ASSISTANT:   1.  Ainsley Spinner, PA-C 2.  PA student.  ANESTHESIA:  General.  ESTIMATED BLOOD LOSS:  100 mL.  DRAINS:  None.  SPECIMENS:  Two anaerobic, aerobic sent to micro.  PATIENT DISPOSITION:  To PACU.  CONDITION:  Stable.  BRIEF SUMMARY FOR INDICATION FOR PROCEDURE:  The patient is a 60 year old female who underwent ORIF of right trimalleolar ankle fracture dislocation and syndesmotic repair by Dr. Harlow Mares at [facility].  The patient is a poorly controlled diabetic,  smoker and alcoholic with a 6-drink-per-day bourbon habit who was noncompliant with weightbearing and was found to be recurrently dislocated on followup at Dr. Harlow Mares' office.  Because of the complexity, Dr. Harlow Mares asserted this was outside his scope of  practice and this injury would be best managed by fellowship trained orthopedic traumatologist.  I discussed the risks and benefits quite frankly in the office with the patient including her social habits, which predisposed to catastrophic complications  and possible deep infection and limb loss.  She did wish to proceed with a fusion.  I did explain that with the ankle having been dislocated to this degree  and over this period of time that an additional osteotomy may be necessary in addition to a  standard fusion.  Again after acknowledging these risks, she did provide consent to proceed.  BRIEF SUMMARY OF PROCEDURE:  The patient was taken to the operating room after removal of her short leg cast.  There was some concern along the wound laterally, but no frank drainage.  Chlorhexidine wash and Betadine scrub and paint were performed.  The  old incisions were reopened.  Because of the concern about the lateral incision, I did send material deep within the wound for anaerobic, aerobic culture.  I continued dissection there and was able to remove the screws and the plate laterally including  the lag screws into the syndesmosis.  That lateral incision was not used subsequently for any portion of additional procedures and was solely for removal of hardware.  On the medial side, the distal screws were identified and removed.  I then brought in  the C-arm and applied stress manually under fluoroscopy to see if I could reduce the ankle and simply remove the articular surface of the tibiotalar joint and proceed with fusion as is typical.   Given the delay and prolonged dislocation, this was not  feasible as the talus remained posterolaterally dislocated.  Consequently, I used an osteotome and performed a complete osteotomy of the tibia, and after doing that, I was able to bring the talus underneath the tibia.  This was achieved from the medial  side.  Once I did so, I then proceeded in standard fashion with fusion, removing the articular surface from the talus, securing provisional position and reduction  and holding it with several K-wires and then following with threaded guidewire from below  the joint through the calcaneus and across the talus and into the tibia.  This was then sequentially reamed and the nail placed securing fixation proximally, compressing the arthrodesis site and then securing fixation into the  talus and calcaneus.  All  of the hardware was checked for length and position.  There were no complications.  PDS and nylon closure were then performed, and because of the recent surgery and wound concerns, a wound VAC in addition.  A sterile gently compressive dressing and a  posterior and stirrup splint were then applied.  The patient was awakened from anesthesia and transported to the PACU in stable condition.  PROGNOSIS:  The patient will be nonweightbearing.  She will continue either with a CIWA protocol or alcohol depending upon her preference and remains at elevated risk of noncompliance and complications given her social habits and underlying medical  conditions which we have addressed and we will continue to do.  Of note, I did place extensive allograft anterior and posterior to the arthrodesis site prior to closure.  She will be on formal pharmacologic DVT prophylaxis.  Ainsley Spinner, PA-C, as well as a PA student were present and assisted me throughout.  IN/NUANCE  D:08/06/2020 T:08/06/2020 JOB:[id number]

## 2020-08-16 ENCOUNTER — Encounter (HOSPITAL_COMMUNITY): Payer: Self-pay | Admitting: Orthopedic Surgery

## 2020-08-16 DIAGNOSIS — G629 Polyneuropathy, unspecified: Secondary | ICD-10-CM

## 2020-08-16 DIAGNOSIS — E559 Vitamin D deficiency, unspecified: Secondary | ICD-10-CM | POA: Insufficient documentation

## 2020-08-16 NOTE — Discharge Summary (Signed)
Orthopaedic Trauma Service (OTS) Discharge Summary   Patient ID: Michaela Johnson MRN: [id number] DOB/AGE: [birth year] 60 y.o.  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 Alcohol use   . Anxiety   . Arrhythmia   . Arthritis   . Chronic alcohol use 07/31/2020  . Chronic anticoagulation 07/31/2020  . 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 Constipation   . Depression   . Diabetes mellitus without complication ([place])   . Dysrhythmia     PAF  . GERD (gastroesophageal reflux disease)   . Hypertension   . Neuropathy   . Nicotine dependence 07/31/2020  . PAF (paroxysmal atrial fibrillation) ([place])   . Panic attacks   . Pneumonia   . Tobacco use   . Vitamin D deficiency      Procedures Performed: 07/30/2020-Dr. Marcelino Scot 1.  Right ankle arthrodesis using a Biomet 10 x 180 mm fusion nail. 2.  Right tibial osteotomy. 3.  Removal of deep implant, right tibia and fibula. 4.  Manual application of stress under fluoroscopy. 5.  Application of a small wound VAC.  Discharged Condition: stable  Hospital Course:  60 year old female with medical history notable for chronic EtOH abuse, nicotine dependence, diabetes, paroxysmal A. fib on Xarelto who sustained a ground-level fall early December with resultant right ankle fracture  dislocation.  She is seen and evaluated by orthopedics in [facility] was taken to the operating room the end of December where ORIF was performed.  At her first office follow-up she was noted to have loss of reduction and subluxation of her talus.  She was referred to the orthopedic trauma service for further evaluation.  Patient was seen in the office and extensive discussions were had with the patient.  We felt that given her overall medical history including her baseline neuropathy, nicotine dependence, alcohol abuse and noncompliance with weightbearing restrictions that she would be best served with a right ankle fusion.  Patient was in agreement with this plan.  Patient was taken to the operating room on 07/30/2020 for the procedures noted above.  After surgery she was transferred to the PACU for recovery from anesthesia and then transferred to the orthopedic floor for observation, pain control and therapies.  Patient was restarted on her home dose of Xarelto on postoperative day #1.  She was monitored very closely.  We did provide her beer with meals to prevent withdrawal.  Patient did well from this perspective.  Her pain was well controlled during hospital stay as well.  Initially we thought that she would need a nursing home due to minimal help at home however she was able to arrange adequate care for herself at home as well as her husband who has TBI.  Patient progressed well over the next several days and ultimately on 08/03/2020 she was deemed to be stable for discharge to home with home health services.  Patient follow-up with OTS in 2 weeks for removal of  her splint as well as her Prevena.  We did review proper operation of her Prevena unit and will contact the office if she has any issues  Consults: None  Significant Diagnostic Studies: labs:   Results for Michaela, Johnson (MRN [id number]) as of 08/16/2020 11:14  Ref. Range 07/30/2020 14:26 07/31/2020 02:24 08/01/2020 01:50  Sodium  Latest Ref Range: 135 - 145 mmol/L 134 (L)    Potassium Latest Ref Range: 3.5 - 5.1 mmol/L 4.2    Chloride Latest Ref Range: 98 - 111 mmol/L 99    CO2 Latest Ref Range: 22 - 32 mmol/L 21 (L)    Glucose Latest Ref Range: 70 - 99 mg/dL 225 (H)    BUN Latest Ref Range: 8 - 23 mg/dL 11    Creatinine Latest Ref Range: 0.44 - 1.00 mg/dL 0.69    Calcium Latest Ref Range: 8.9 - 10.3 mg/dL 8.5 (L)    Anion gap Latest Ref Range: 5 - 15  14    Phosphorus Latest Ref Range: 2.5 - 4.6 mg/dL 4.0    Magnesium Latest Ref Range: 1.7 - 2.4 mg/dL 1.7    Alkaline Phosphatase Latest Ref Range: 38 - 126 U/L 106    Albumin Latest Ref Range: 3.5 - 5.0 g/dL 3.0 (L)    AST Latest Ref Range: 15 - 41 U/L 24    ALT Latest Ref Range: 0 - 44 U/L 13    Total Protein Latest Ref Range: 6.5 - 8.1 g/dL 6.6    Total Bilirubin Latest Ref Range: 0.3 - 1.2 mg/dL 0.3    GFR, Estimated Latest Ref Range: >60 mL/min >60    WBC Latest Ref Range: 4.0 - 10.5 K/uL 9.4 12.5 (H) 13.5 (H)  RBC Latest Ref Range: 3.87 - 5.11 MIL/uL 3.58 (L) 2.90 (L) 2.83 (L)  Hemoglobin Latest Ref Range: 12.0 - 15.0 g/dL 11.5 (L) 9.7 (L) 9.4 (L)  HCT Latest Ref Range: 36.0 - 46.0 % 35.4 (L) 27.7 (L) 27.6 (L)  MCV Latest Ref Range: 80.0 - 100.0 fL 98.9 95.5 97.5  MCH Latest Ref Range: 26.0 - 34.0 pg 32.1 33.4 33.2  MCHC Latest Ref Range: 30.0 - 36.0 g/dL 32.5 35.0 34.1  RDW Latest Ref Range: 11.5 - 15.5 % 12.6 12.6 12.9  Platelets Latest Ref Range: 150 - 400 K/uL 295 288 287  nRBC Latest Ref Range: 0.0 - 0.2 % 0.0 0.0 0.0   Results for Michaela, Johnson (MRN [id number]) as of 08/16/2020 11:14  Ref. Range 07/30/2020 05:55  Vitamin D, 25-Hydroxy Latest Ref Range: 30 - 100 ng/mL 35.18    Treatments: IV hydration, antibiotics: Ancef, analgesia: acetaminophen, Dilaudid and oxycodone, anticoagulation: Home dose Xarelto, therapies: PT, OT and RN and surgery: As above  Discharge Exam:  Subjective: Doing well, no c/o. Insisting on going home.      Objective: Vital signs in last 24 hours: Temp:  [98 F (36.7 C)-98.7 F (37.1 C)] 98.7 F (37.1 C) (01/14 0819) Pulse Rate:  [72-82] 80 (01/14 0819) Resp:  [15-20] 18 (01/14 0819) BP: (113-153)/(61-78) 153/78 (01/14 0819) SpO2:  [94 %-96 %] 95 % (01/14 0819) Last BM Date: 08/03/19     Laboratory  CBC Recent Labs (last 2 labs)      Recent Labs    08/01/20 0150  WBC 13.5*  HGB 9.4*  HCT 27.6*  PLT 287      BMET Recent Labs (last 2 labs)   No results for input(s): NA, K, CL, CO2, GLUCOSE, BUN, CREATININE, CALCIUM in  the last 72 hours.       Physical Exam General appearance: alert and no distress  RLE: Splint in place, able to move toes, sensation baseline     Assessment/Plan: S/p right ankle fusion -- Ok to d/c home. D/C Prevena before discharge.     Disposition: Discharge disposition: 01-Home or Self Care       Discharge Instructions    Call MD / Call 911   Complete by: As directed    If you experience chest pain or shortness of breath, CALL 911 and be transported to the hospital emergency room.  If you develope a fever above 101 F, pus (white drainage) or increased drainage or redness at the wound, or calf pain, call your surgeon's office.   Constipation Prevention   Complete by: As directed    Drink plenty of fluids.  Prune juice may be helpful.  You may use a stool softener, such as Colace (over the counter) 100 mg twice a day.  Use MiraLax (over the counter) for constipation as needed.   Diet - low sodium heart healthy   Complete by: As directed    Discharge instructions   Complete by: As directed    Orthopaedic Trauma Service Discharge Instructions   General Discharge Instructions  Orthopaedic Injuries:  Right ankle fracture dislocation treated with tibiotalocalcaneal fusion   WEIGHT BEARING STATUS: nonweightbearing right leg. Do not put any weight through right leg   RANGE OF MOTION/ACTIVITY: ok to move toes on right foot. Ok to move right  knee. Do not remove splint  Bone health: vitamin d levels look ok. Recommend bone density scan in 2-3 months.   Wound Care: do not remove splint. Make sure prevena (wound vac) has full charge whenever possible. Recommend plugging it in at night. Call office if there are any concerns   DVT/PE prophylaxis: continue home xarelto regimen   Diet: as you were eating previously.  Can use over the counter stool softeners and bowel preparations, such as Miralax, to help with bowel movements.  Narcotics can be constipating.  Be sure to drink plenty of fluids  PAIN MEDICATION USE AND EXPECTATIONS  You have likely been given narcotic medications to help control your pain.  After a traumatic event that results in an fracture (broken bone) with or without surgery, it is ok to use narcotic pain medications to help control one's pain.  We understand that everyone responds to pain differently and each individual patient will be evaluated on a regular basis for the continued need for narcotic medications. Ideally, narcotic medication use should last no more than 6-8 weeks (coinciding with fracture healing).   As a patient it is your responsibility as well to monitor narcotic medication use and report the amount and frequency you use these medications when you come to your office visit.   We would also advise that if you are using narcotic medications, you should take a dose prior to therapy to maximize you participation.  IF YOU ARE ON NARCOTIC MEDICATIONS IT IS NOT PERMISSIBLE TO OPERATE A MOTOR VEHICLE (MOTORCYCLE/CAR/TRUCK/MOPED) OR HEAVY MACHINERY DO NOT MIX NARCOTICS WITH OTHER CNS (CENTRAL NERVOUS SYSTEM) DEPRESSANTS SUCH AS ALCOHOL   STOP SMOKING OR USING NICOTINE PRODUCTS!!!!  As discussed nicotine severely impairs your body's ability to heal surgical and traumatic wounds but also impairs bone healing.  Wounds and bone heal by forming microscopic blood vessels (angiogenesis) and nicotine is a  vasoconstrictor (essentially, shrinks blood vessels).  Therefore, if vasoconstriction occurs to these  microscopic blood vessels they essentially disappear and are unable to deliver necessary nutrients to the healing tissue.  This is one modifiable factor that you can do to dramatically increase your chances of healing your injury.    (This means no smoking, no nicotine gum, patches, etc)  DO NOT USE NONSTEROIDAL ANTI-INFLAMMATORY DRUGS (NSAID'S)  Using products such as Advil (ibuprofen), Aleve (naproxen), Motrin (ibuprofen) for additional pain control during fracture healing can delay and/or prevent the healing response.  If you would like to take over the counter (OTC) medication, Tylenol (acetaminophen) is ok.  However, some narcotic medications that are given for pain control contain acetaminophen as well. Therefore, you should not exceed more than 4000 mg of tylenol in a day if you do not have liver disease.  Also note that there are may OTC medicines, such as cold medicines and allergy medicines that my contain tylenol as well.  If you have any questions about medications and/or interactions please ask your doctor/PA or your pharmacist.      ICE AND ELEVATE INJURED/OPERATIVE EXTREMITY  Using ice and elevating the injured extremity above your heart can help with swelling and pain control.  Icing in a pulsatile fashion, such as 20 minutes on and 20 minutes off, can be followed.    Do not place ice directly on skin. Make sure there is a barrier between to skin and the ice pack.    Using frozen items such as frozen peas works well as the conform nicely to the are that needs to be iced.  USE AN ACE WRAP OR TED HOSE FOR SWELLING CONTROL  In addition to icing and elevation, Ace wraps or TED hose are used to help limit and resolve swelling.  It is recommended to use Ace wraps or TED hose until you are informed to stop.    When using Ace Wraps start the wrapping distally (farthest away from the body) and  wrap proximally (closer to the body)   Example: If you had surgery on your leg or thing and you do not have a splint on, start the ace wrap at the toes and work your way up to the thigh        If you had surgery on your upper extremity and do not have a splint on, start the ace wrap at your fingers and work your way up to the upper arm  IF YOU ARE IN A SPLINT OR CAST DO NOT [address]   If your splint gets wet for any reason please contact the office immediately. You may shower in your splint or cast as long as you keep it dry.  This can be done by wrapping in a cast cover or garbage back (or similar)  Do Not stick any thing down your splint or cast such as pencils, money, or hangers to try and scratch yourself with.  If you feel itchy take benadryl as prescribed on the bottle for itching  IF YOU ARE IN A CAM BOOT (BLACK BOOT)  You may remove boot periodically. Perform daily dressing changes as noted below.  Wash the liner of the boot regularly and wear a sock when wearing the boot. It is recommended that you sleep in the boot until told otherwise    Call office for the following: Temperature greater than 101F Persistent nausea and vomiting Severe uncontrolled pain Redness, tenderness, or signs of infection (pain, swelling, redness, odor or green/yellow discharge around the site) Difficulty breathing, headache or visual  disturbances Hives Persistent dizziness or light-headedness Extreme fatigue Any other questions or concerns you may have after discharge  In an emergency, call 911 or go to an Emergency Department at a nearby hospital  HELPFUL INFORMATION  If you had a block, it will wear off between 8-24 hrs postop typically.  This is period when your pain may go from nearly zero to the pain you would have had postop without the block.  This is an abrupt transition but nothing dangerous is happening.  You may take an extra dose of narcotic when this happens.  You should  wean off your narcotic medicines as soon as you are able.  Most patients will be off or using minimal narcotics before their first postop appointment.   We suggest you use the pain medication the first night prior to going to bed, in order to ease any pain when the anesthesia wears off. You should avoid taking pain medications on an empty stomach as it will make you nauseous.  Do not drink alcoholic beverages or take illicit drugs when taking pain medications.  In most states it is against the law to drive while you are in a splint or sling.  And certainly against the law to drive while taking narcotics.  You may return to work/school in the next couple of days when you feel up to it.   Pain medication may make you constipated.  Below are a few solutions to try in this order: Decrease the amount of pain medication if you aren't having pain. Drink lots of decaffeinated fluids. Drink prune juice and/or each dried prunes  If the first 3 don't work start with additional solutions Take Colace - an over-the-counter stool softener Take Senokot - an over-the-counter laxative Take Miralax - a stronger over-the-counter laxative     CALL THE OFFICE WITH ANY QUESTIONS OR CONCERNS: [phone]   VISIT OUR WEBSITE FOR ADDITIONAL INFORMATION: [email or web address]   Driving restrictions   Complete by: As directed    No driving   Increase activity slowly as tolerated   Complete by: As directed    Non weight bearing   Complete by: As directed    Laterality: right   Extremity: Lower     Allergies as of 08/03/2020      Reactions   Codeine Itching   Levaquin [levofloxacin In D5w] Itching      Medication List    STOP taking these medications   oxyCODONE-acetaminophen 10-325 MG tablet Commonly known as: Percocet Replaced by: oxyCODONE-acetaminophen 5-325 MG tablet     TAKE these medications   acetaminophen 325 MG tablet Commonly known as: TYLENOL Take 1-2 tablets (325-650 mg total) by  mouth every 6 (six) hours as needed for mild pain (pain score 1-3 or temp > 100.5).   albuterol 108 (90 Base) MCG/ACT inhaler Commonly known as: VENTOLIN HFA Inhale 2 puffs into the lungs every 6 (six) hours as needed for wheezing or shortness of breath.   ascorbic acid 500 MG tablet Commonly known as: VITAMIN C Take 1 tablet (500 mg total) by mouth daily.   diltiazem 240 MG 24 hr capsule Commonly known as: CARDIZEM CD Take 1 capsule (240 mg total) by mouth daily.   diphenhydrAMINE 12.5 MG/5ML liquid Commonly known as: BENADRYL Take 25 mg by mouth 4 (four) times daily as needed for itching.   docusate sodium 100 MG capsule Commonly known as: COLACE Take 1 capsule (100 mg total) by mouth 2 (two) times daily.   doxycycline 100 MG  tablet Commonly known as: VIBRA-TABS Take 1 tablet (100 mg total) by mouth 2 (two) times daily for 21 days.   FLUoxetine 40 MG capsule Commonly known as: PROZAC Take 40 mg by mouth daily.   gabapentin 300 MG capsule Commonly known as: NEURONTIN Take 600 mg by mouth at bedtime as needed (Neuropathy).   insulin aspart 100 UNIT/ML injection Commonly known as: novoLOG Inject 4 Units into the skin 3 (three) times daily with meals.   insulin glargine 100 UNIT/ML injection Commonly known as: LANTUS Inject 46 Units into the skin in the morning.   lisinopril 40 MG tablet Commonly known as: ZESTRIL Take 1 tablet (40 mg total) by mouth daily.   multivitamin with minerals Tabs tablet Take 1 tablet by mouth daily.   omeprazole 20 MG capsule Commonly known as: PRILOSEC Take 20 mg by mouth daily.   oxyCODONE-acetaminophen 5-325 MG tablet Commonly known as: Percocet Take 1-2 tablets by mouth every 6 (six) hours as needed for moderate pain or severe pain. Replaces: oxyCODONE-acetaminophen 10-325 MG tablet   rivaroxaban 20 MG Tabs tablet Commonly known as: XARELTO Take 1 tablet (20 mg total) by mouth daily with supper. What changed: when to take  this   Vitamin D 125 MCG (5000 UT) Caps Take 1 capsule by mouth daily.            Discharge Care Instructions  (From admission, onward)         Start     Ordered   08/03/20 0000  Non weight bearing       Question Answer Comment  Laterality right   Extremity Lower      08/03/20 1228          Follow-up Information    Altamese [place], MD. Schedule an appointment as soon as possible for a visit on 08/15/2020.   Specialty: Orthopedic Surgery Contact information: [place] [address] [phone]               Discharge Instructions and Plan:  60 year old female right trimalleolar ankle fracture dislocation nonunion s/p ORIF 07/04/2020 with loss of reduction s/p removal of hardware and right ankle fusion  Weightbearing: NWB RLE Insicional and dressing care: Dressings left intact until follow-up Orthopedic device(s): Splint and Prevena Showering: Okay to shower but keep splint clean and dry VTE prophylaxis: Home dose Xarelto . Pain control: Multimodal: Tylenol and Percocet Bone Health/Optimization: Continue with home regimen.  Vitamin D levels are adequate Follow - up plan: 14 days Contact information:  Altamese [place] MD, Ainsley Spinner PA-C   Signed:  Jari Pigg, PA-C [phone] (C) 08/16/2020, 11:14 AM  [facility] [place] [address] [phone] Domingo Sep (F)

## 2021-02-09 ENCOUNTER — Emergency Department: Payer: Self-pay

## 2021-02-09 ENCOUNTER — Other Ambulatory Visit: Payer: Self-pay

## 2021-02-09 DIAGNOSIS — F1020 Alcohol dependence, uncomplicated: Secondary | ICD-10-CM | POA: Diagnosis present

## 2021-02-09 DIAGNOSIS — Z8249 Family history of ischemic heart disease and other diseases of the circulatory system: Secondary | ICD-10-CM

## 2021-02-09 DIAGNOSIS — F410 Panic disorder [episodic paroxysmal anxiety] without agoraphobia: Secondary | ICD-10-CM | POA: Diagnosis present

## 2021-02-09 DIAGNOSIS — I480 Paroxysmal atrial fibrillation: Secondary | ICD-10-CM | POA: Diagnosis present

## 2021-02-09 DIAGNOSIS — I447 Left bundle-branch block, unspecified: Secondary | ICD-10-CM | POA: Diagnosis present

## 2021-02-09 DIAGNOSIS — F17210 Nicotine dependence, cigarettes, uncomplicated: Secondary | ICD-10-CM | POA: Diagnosis present

## 2021-02-09 DIAGNOSIS — E1010 Type 1 diabetes mellitus with ketoacidosis without coma: Principal | ICD-10-CM | POA: Diagnosis present

## 2021-02-09 DIAGNOSIS — J189 Pneumonia, unspecified organism: Secondary | ICD-10-CM | POA: Diagnosis present

## 2021-02-09 DIAGNOSIS — J440 Chronic obstructive pulmonary disease with acute lower respiratory infection: Secondary | ICD-10-CM | POA: Diagnosis present

## 2021-02-09 DIAGNOSIS — E1042 Type 1 diabetes mellitus with diabetic polyneuropathy: Secondary | ICD-10-CM | POA: Diagnosis present

## 2021-02-09 DIAGNOSIS — K219 Gastro-esophageal reflux disease without esophagitis: Secondary | ICD-10-CM | POA: Diagnosis present

## 2021-02-09 DIAGNOSIS — Z794 Long term (current) use of insulin: Secondary | ICD-10-CM

## 2021-02-09 DIAGNOSIS — Z7901 Long term (current) use of anticoagulants: Secondary | ICD-10-CM

## 2021-02-09 DIAGNOSIS — E559 Vitamin D deficiency, unspecified: Secondary | ICD-10-CM | POA: Diagnosis present

## 2021-02-09 DIAGNOSIS — I10 Essential (primary) hypertension: Secondary | ICD-10-CM | POA: Diagnosis present

## 2021-02-09 DIAGNOSIS — Z20822 Contact with and (suspected) exposure to covid-19: Secondary | ICD-10-CM | POA: Diagnosis present

## 2021-02-09 DIAGNOSIS — R300 Dysuria: Secondary | ICD-10-CM | POA: Diagnosis present

## 2021-02-09 DIAGNOSIS — Z833 Family history of diabetes mellitus: Secondary | ICD-10-CM

## 2021-02-09 LAB — COMPREHENSIVE METABOLIC PANEL
ALT: 15 U/L (ref 0–44)
AST: 23 U/L (ref 15–41)
Albumin: 3.8 g/dL (ref 3.5–5.0)
Alkaline Phosphatase: 122 U/L (ref 38–126)
Anion gap: 24 — ABNORMAL HIGH (ref 5–15)
BUN: 31 mg/dL — ABNORMAL HIGH (ref 8–23)
CO2: 10 mmol/L — ABNORMAL LOW (ref 22–32)
Calcium: 9.1 mg/dL (ref 8.9–10.3)
Chloride: 97 mmol/L — ABNORMAL LOW (ref 98–111)
Creatinine, Ser: 1.17 mg/dL — ABNORMAL HIGH (ref 0.44–1.00)
GFR, Estimated: 53 mL/min — ABNORMAL LOW (ref >60)
Glucose, Bld: 582 mg/dL (ref 70–99)
Potassium: 5.3 mmol/L — ABNORMAL HIGH (ref 3.5–5.1)
Sodium: 131 mmol/L — ABNORMAL LOW (ref 135–145)
Total Bilirubin: 2 mg/dL — ABNORMAL HIGH (ref 0.3–1.2)
Total Protein: 7.8 g/dL (ref 6.5–8.1)

## 2021-02-09 LAB — BLOOD GAS, VENOUS
Acid-base deficit: 19 mmol/L — ABNORMAL HIGH (ref 0.0–2.0)
Bicarbonate: 9.7 mmol/L — ABNORMAL LOW (ref 20.0–28.0)
O2 Saturation: 85.2 %
Patient temperature: 37
pCO2, Ven: 32 mmHg — ABNORMAL LOW (ref 44.0–60.0)
pH, Ven: 7.09 — CL (ref 7.250–7.430)
pO2, Ven: 70 mmHg — ABNORMAL HIGH (ref 32.0–45.0)

## 2021-02-09 LAB — CBC
HCT: 36.4 % (ref 36.0–46.0)
Hemoglobin: 12 g/dL (ref 12.0–15.0)
MCH: 33.4 pg (ref 26.0–34.0)
MCHC: 33 g/dL (ref 30.0–36.0)
MCV: 101.4 fL — ABNORMAL HIGH (ref 80.0–100.0)
Platelets: 329 10*3/uL (ref 150–400)
RBC: 3.59 MIL/uL — ABNORMAL LOW (ref 3.87–5.11)
RDW: 13.2 % (ref 11.5–15.5)
WBC: 12.8 10*3/uL — ABNORMAL HIGH (ref 4.0–10.5)
nRBC: 0 % (ref 0.0–0.2)

## 2021-02-09 LAB — CBG MONITORING, ED: Glucose-Capillary: 580 mg/dL (ref 70–99)

## 2021-02-09 MED ORDER — FAMOTIDINE IN NACL 20-0.9 MG/50ML-% IV SOLN
20.0000 mg | Freq: Once | INTRAVENOUS | Status: AC
  Administered 2021-02-10: 20 mg via INTRAVENOUS
  Filled 2021-02-09: qty 50

## 2021-02-09 MED ORDER — LACTATED RINGERS IV BOLUS
20.0000 mL/kg | Freq: Once | INTRAVENOUS | Status: AC
  Administered 2021-02-10: 1424 mL via INTRAVENOUS

## 2021-02-09 MED ORDER — DEXTROSE IN LACTATED RINGERS 5 % IV SOLN: INTRAVENOUS | Status: DC

## 2021-02-09 MED ORDER — DEXTROSE 50 % IV SOLN: 0.0000 mL | INTRAVENOUS | Status: DC | PRN

## 2021-02-09 MED ORDER — LACTATED RINGERS IV SOLN: INTRAVENOUS | Status: DC

## 2021-02-09 MED ORDER — ONDANSETRON HCL 4 MG/2ML IJ SOLN
4.0000 mg | Freq: Once | INTRAMUSCULAR | Status: AC
  Administered 2021-02-10: 4 mg via INTRAVENOUS
  Filled 2021-02-09: qty 2

## 2021-02-09 MED ORDER — INSULIN REGULAR(HUMAN) IN NACL 100-0.9 UT/100ML-% IV SOLN
INTRAVENOUS | Status: DC
  Administered 2021-02-09: 4.6 [IU]/h via INTRAVENOUS
  Filled 2021-02-09: qty 100

## 2021-02-09 MED ORDER — SODIUM CHLORIDE 0.9 % IV BOLUS
1000.0000 mL | Freq: Once | INTRAVENOUS | Status: AC
  Administered 2021-02-09: 1000 mL via INTRAVENOUS

## 2021-02-09 NOTE — ED Notes (Signed)
Charge RN made aware of pt

## 2021-02-09 NOTE — ED Notes (Signed)
Patient to waiting area via wheelchair by EMS from home.  Per EMS patient recently switched diabetic medicine.  Patient to ED for vomiting. Initial CBG 559, given fluid bolus last CBG 509.  BP -142/80, HR -110, RR - 36.  IV to right antecub via 20 G angiocath, given Zofran 4 mg via IV.

## 2021-02-09 NOTE — ED Provider Notes (Signed)
Emergency Medicine Provider Triage Evaluation Note  Michaela Johnson , a 60 y.o. female  was evaluated in triage.  Pt complains of nausea/vomiting, hyperglycemia.  Review of Systems  Positive: Nausea/vomiting Negative: Chest pain/shortness of breath  Physical Exam  BP 137/66   Pulse (!) 114   Temp 98.3 F (36.8 C) (Oral)   Resp (!) 26   Ht '5\' 2"'$  (1.575 m)   Wt 71.2 kg   SpO2 100%   BMI 28.72 kg/m  Gen:   Awake, mild to moderate distress   Resp:  Increased effort  MSK:   Moves extremities without difficulty  Other:    Medical Decision Making  Medically screening exam initiated at 11:14 PM.  Appropriate orders placed.  Michaela Johnson was informed that the remainder of the evaluation will be completed by another provider, this initial triage assessment does not replace that evaluation, and the importance of remaining in the ED until their evaluation is complete.  60 year old female who started a new diabetic medication yesterday presenting with nausea/vomiting.  Laboratory results demonstrate DKA.  Will initiate IV fluid resuscitation, insulin drip.  Patient awaiting next bed.  Anticipate admission.   Michaela Blanch, MD 02/09/21 [phone]

## 2021-02-09 NOTE — ED Notes (Signed)
Dr. Archie Balboa notified of critical glucose of 582

## 2021-02-09 NOTE — ED Triage Notes (Signed)
Pt states she is vomiting today. Pt with insulin dependent diabetes, pt with dry oral mucus membranes, ems gave 1063m ns bolus.

## 2021-02-10 ENCOUNTER — Inpatient Hospital Stay
Admission: EM | Admit: 2021-02-10 | Discharge: 2021-02-12 | DRG: 637 | Disposition: A | Discharge status: Home / Self Care | Payer: Self-pay | Attending: Internal Medicine | Admitting: Internal Medicine

## 2021-02-10 ENCOUNTER — Emergency Department: Payer: Self-pay

## 2021-02-10 ENCOUNTER — Encounter: Payer: Self-pay | Admitting: Internal Medicine

## 2021-02-10 ENCOUNTER — Other Ambulatory Visit: Payer: Self-pay

## 2021-02-10 DIAGNOSIS — I152 Hypertension secondary to endocrine disorders: Secondary | ICD-10-CM | POA: Diagnosis present

## 2021-02-10 DIAGNOSIS — F1020 Alcohol dependence, uncomplicated: Secondary | ICD-10-CM

## 2021-02-10 DIAGNOSIS — F17200 Nicotine dependence, unspecified, uncomplicated: Secondary | ICD-10-CM | POA: Diagnosis present

## 2021-02-10 DIAGNOSIS — J189 Pneumonia, unspecified organism: Secondary | ICD-10-CM

## 2021-02-10 DIAGNOSIS — Z794 Long term (current) use of insulin: Secondary | ICD-10-CM

## 2021-02-10 DIAGNOSIS — E1159 Type 2 diabetes mellitus with other circulatory complications: Secondary | ICD-10-CM | POA: Diagnosis present

## 2021-02-10 DIAGNOSIS — I4891 Unspecified atrial fibrillation: Secondary | ICD-10-CM | POA: Diagnosis present

## 2021-02-10 DIAGNOSIS — Z7901 Long term (current) use of anticoagulants: Secondary | ICD-10-CM

## 2021-02-10 DIAGNOSIS — I10 Essential (primary) hypertension: Secondary | ICD-10-CM | POA: Diagnosis present

## 2021-02-10 DIAGNOSIS — E1010 Type 1 diabetes mellitus with ketoacidosis without coma: Secondary | ICD-10-CM

## 2021-02-10 DIAGNOSIS — E1110 Type 2 diabetes mellitus with ketoacidosis without coma: Secondary | ICD-10-CM | POA: Diagnosis present

## 2021-02-10 DIAGNOSIS — E1169 Type 2 diabetes mellitus with other specified complication: Secondary | ICD-10-CM

## 2021-02-10 LAB — CBG MONITORING, ED
Glucose-Capillary: 575 mg/dL (ref 70–99)
Glucose-Capillary: 451 mg/dL — ABNORMAL HIGH (ref 70–99)
Glucose-Capillary: 340 mg/dL — ABNORMAL HIGH (ref 70–99)
Glucose-Capillary: 315 mg/dL — ABNORMAL HIGH (ref 70–99)
Glucose-Capillary: 281 mg/dL — ABNORMAL HIGH (ref 70–99)
Glucose-Capillary: 261 mg/dL — ABNORMAL HIGH (ref 70–99)
Glucose-Capillary: 200 mg/dL — ABNORMAL HIGH (ref 70–99)

## 2021-02-10 LAB — BASIC METABOLIC PANEL
Anion gap: 18 — ABNORMAL HIGH (ref 5–15)
Anion gap: 9 (ref 5–15)
BUN: 32 mg/dL — ABNORMAL HIGH (ref 8–23)
BUN: 29 mg/dL — ABNORMAL HIGH (ref 8–23)
CO2: 13 mmol/L — ABNORMAL LOW (ref 22–32)
CO2: 22 mmol/L (ref 22–32)
Calcium: 8.7 mg/dL — ABNORMAL LOW (ref 8.9–10.3)
Calcium: 8.4 mg/dL — ABNORMAL LOW (ref 8.9–10.3)
Chloride: 104 mmol/L (ref 98–111)
Chloride: 104 mmol/L (ref 98–111)
Creatinine, Ser: 1.35 mg/dL — ABNORMAL HIGH (ref 0.44–1.00)
Creatinine, Ser: 1.08 mg/dL — ABNORMAL HIGH (ref 0.44–1.00)
GFR, Estimated: 45 mL/min — ABNORMAL LOW (ref >60)
GFR, Estimated: 58 mL/min — ABNORMAL LOW (ref >60)
Glucose, Bld: 416 mg/dL — ABNORMAL HIGH (ref 70–99)
Glucose, Bld: 240 mg/dL — ABNORMAL HIGH (ref 70–99)
Potassium: 4.2 mmol/L (ref 3.5–5.1)
Potassium: 3.9 mmol/L (ref 3.5–5.1)
Sodium: 135 mmol/L (ref 135–145)
Sodium: 135 mmol/L (ref 135–145)

## 2021-02-10 LAB — URINALYSIS, COMPLETE (UACMP) WITH MICROSCOPIC
Bacteria, UA: NONE SEEN
Bilirubin Urine: NEGATIVE
Glucose, UA: >=500 mg/dL — AB
Ketones, ur: 80 mg/dL — AB
Leukocytes,Ua: NEGATIVE
Nitrite: NEGATIVE
Protein, ur: 30 mg/dL — AB
Specific Gravity, Urine: 1.021 (ref 1.005–1.030)
pH: 5 (ref 5.0–8.0)

## 2021-02-10 LAB — RESP PANEL BY RT-PCR (FLU A&B, COVID) ARPGX2
Influenza A by PCR: NEGATIVE
Influenza B by PCR: NEGATIVE
SARS Coronavirus 2 by RT PCR: NEGATIVE

## 2021-02-10 LAB — MRSA NEXT GEN BY PCR, NASAL: MRSA by PCR Next Gen: NOT DETECTED

## 2021-02-10 LAB — GLUCOSE, CAPILLARY
Glucose-Capillary: 157 mg/dL — ABNORMAL HIGH (ref 70–99)
Glucose-Capillary: 231 mg/dL — ABNORMAL HIGH (ref 70–99)
Glucose-Capillary: 238 mg/dL — ABNORMAL HIGH (ref 70–99)
Glucose-Capillary: 216 mg/dL — ABNORMAL HIGH (ref 70–99)
Glucose-Capillary: 261 mg/dL — ABNORMAL HIGH (ref 70–99)
Glucose-Capillary: 213 mg/dL — ABNORMAL HIGH (ref 70–99)
Glucose-Capillary: 150 mg/dL — ABNORMAL HIGH (ref 70–99)
Glucose-Capillary: 196 mg/dL — ABNORMAL HIGH (ref 70–99)

## 2021-02-10 LAB — PROCALCITONIN: Procalcitonin: 0.47 ng/mL

## 2021-02-10 LAB — MAGNESIUM: Magnesium: 2.3 mg/dL (ref 1.7–2.4)

## 2021-02-10 LAB — TROPONIN I (HIGH SENSITIVITY): Troponin I (High Sensitivity): 9 ng/L (ref <18)

## 2021-02-10 LAB — HIV ANTIBODY (ROUTINE TESTING W REFLEX): HIV Screen 4th Generation wRfx: NONREACTIVE

## 2021-02-10 LAB — BETA-HYDROXYBUTYRIC ACID: Beta-Hydroxybutyric Acid: >8 mmol/L — ABNORMAL HIGH (ref 0.05–0.27)

## 2021-02-10 LAB — LIPASE, BLOOD: Lipase: 20 U/L (ref 11–51)

## 2021-02-10 LAB — PHOSPHORUS: Phosphorus: 6.3 mg/dL — ABNORMAL HIGH (ref 2.5–4.6)

## 2021-02-10 MED ORDER — LORAZEPAM 2 MG/ML IJ SOLN
0.0000 mg | Freq: Four times a day (QID) | INTRAMUSCULAR | Status: DC
  Administered 2021-02-10: 1 mg via INTRAVENOUS
  Filled 2021-02-10: qty 1

## 2021-02-10 MED ORDER — LORAZEPAM 2 MG/ML IJ SOLN: 0.0000 mg | Freq: Two times a day (BID) | INTRAMUSCULAR | Status: DC

## 2021-02-10 MED ORDER — ONDANSETRON HCL 4 MG PO TABS: 4.0000 mg | ORAL_TABLET | Freq: Four times a day (QID) | ORAL | Status: DC | PRN

## 2021-02-10 MED ORDER — CEFTRIAXONE SODIUM 2 G IJ SOLR
2.0000 g | INTRAMUSCULAR | Status: DC
  Administered 2021-02-10 – 2021-02-12 (×3): 2 g via INTRAVENOUS
  Filled 2021-02-10 (×2): qty 20
  Filled 2021-02-10: qty 2
  Filled 2021-02-10: qty 20

## 2021-02-10 MED ORDER — CHLORHEXIDINE GLUCONATE CLOTH 2 % EX PADS
6.0000 | MEDICATED_PAD | Freq: Every day | CUTANEOUS | Status: DC
  Administered 2021-02-10 – 2021-02-12 (×3): 6 via TOPICAL

## 2021-02-10 MED ORDER — FOLIC ACID 1 MG PO TABS
1.0000 mg | ORAL_TABLET | Freq: Every day | ORAL | Status: DC
  Administered 2021-02-10 – 2021-02-12 (×3): 1 mg via ORAL
  Filled 2021-02-10 (×3): qty 1

## 2021-02-10 MED ORDER — SODIUM CHLORIDE 0.9 % IV SOLN
500.0000 mg | INTRAVENOUS | Status: DC
  Administered 2021-02-10: 500 mg via INTRAVENOUS
  Filled 2021-02-10 (×2): qty 500

## 2021-02-10 MED ORDER — SODIUM CHLORIDE 0.9 % IV SOLN: INTRAVENOUS | Status: DC

## 2021-02-10 MED ORDER — RIVAROXABAN 15 MG PO TABS: 15.0000 mg | ORAL_TABLET | Freq: Every day | ORAL | Status: DC

## 2021-02-10 MED ORDER — ADULT MULTIVITAMIN W/MINERALS CH
1.0000 | ORAL_TABLET | Freq: Every day | ORAL | Status: DC
  Administered 2021-02-10 – 2021-02-12 (×3): 1 via ORAL
  Filled 2021-02-10 (×3): qty 1

## 2021-02-10 MED ORDER — LORAZEPAM 2 MG/ML IJ SOLN: 1.0000 mg | INTRAMUSCULAR | Status: DC | PRN

## 2021-02-10 MED ORDER — INSULIN REGULAR(HUMAN) IN NACL 100-0.9 UT/100ML-% IV SOLN
INTRAVENOUS | Status: DC
  Administered 2021-02-10: 7.5 [IU]/h via INTRAVENOUS

## 2021-02-10 MED ORDER — INSULIN GLARGINE 100 UNIT/ML ~~LOC~~ SOLN
25.0000 [IU] | Freq: Every day | SUBCUTANEOUS | Status: DC
  Administered 2021-02-10: 25 [IU] via SUBCUTANEOUS
  Filled 2021-02-10 (×2): qty 0.25

## 2021-02-10 MED ORDER — THIAMINE HCL 100 MG PO TABS
100.0000 mg | ORAL_TABLET | Freq: Every day | ORAL | Status: DC
  Administered 2021-02-10 – 2021-02-12 (×3): 100 mg via ORAL
  Filled 2021-02-10 (×4): qty 1

## 2021-02-10 MED ORDER — THIAMINE HCL 100 MG/ML IJ SOLN
100.0000 mg | Freq: Every day | INTRAMUSCULAR | Status: DC
  Filled 2021-02-10 (×2): qty 2

## 2021-02-10 MED ORDER — SENNOSIDES-DOCUSATE SODIUM 8.6-50 MG PO TABS: 1.0000 | ORAL_TABLET | Freq: Every evening | ORAL | Status: DC | PRN

## 2021-02-10 MED ORDER — IPRATROPIUM-ALBUTEROL 0.5-2.5 (3) MG/3ML IN SOLN
3.0000 mL | Freq: Four times a day (QID) | RESPIRATORY_TRACT | Status: DC
  Administered 2021-02-10 – 2021-02-11 (×3): 3 mL via RESPIRATORY_TRACT
  Filled 2021-02-10 (×5): qty 3

## 2021-02-10 MED ORDER — LORAZEPAM 1 MG PO TABS: 1.0000 mg | ORAL_TABLET | ORAL | Status: DC | PRN

## 2021-02-10 MED ORDER — ACETAMINOPHEN 650 MG RE SUPP: 650.0000 mg | Freq: Four times a day (QID) | RECTAL | Status: DC | PRN

## 2021-02-10 MED ORDER — AZITHROMYCIN 500 MG PO TABS
500.0000 mg | ORAL_TABLET | Freq: Every day | ORAL | Status: DC
  Administered 2021-02-11 – 2021-02-12 (×2): 500 mg via ORAL
  Filled 2021-02-10 (×2): qty 1

## 2021-02-10 MED ORDER — INSULIN ASPART 100 UNIT/ML IJ SOLN: 0.0000 [IU] | Freq: Every day | INTRAMUSCULAR | Status: DC

## 2021-02-10 MED ORDER — RIVAROXABAN 20 MG PO TABS
20.0000 mg | ORAL_TABLET | Freq: Every day | ORAL | Status: DC
  Administered 2021-02-10 – 2021-02-11 (×2): 20 mg via ORAL
  Filled 2021-02-10 (×3): qty 1

## 2021-02-10 MED ORDER — INSULIN ASPART 100 UNIT/ML IJ SOLN
0.0000 [IU] | Freq: Three times a day (TID) | INTRAMUSCULAR | Status: DC
  Administered 2021-02-10: 5 [IU] via SUBCUTANEOUS
  Administered 2021-02-10: 2 [IU] via SUBCUTANEOUS
  Administered 2021-02-11: 11 [IU] via SUBCUTANEOUS
  Administered 2021-02-11: 5 [IU] via SUBCUTANEOUS
  Administered 2021-02-11: 3 [IU] via SUBCUTANEOUS
  Administered 2021-02-12: 11 [IU] via SUBCUTANEOUS
  Administered 2021-02-12: 8 [IU] via SUBCUTANEOUS
  Filled 2021-02-10 (×7): qty 1

## 2021-02-10 MED ORDER — ACETAMINOPHEN 325 MG PO TABS
650.0000 mg | ORAL_TABLET | Freq: Four times a day (QID) | ORAL | Status: DC | PRN
  Administered 2021-02-10 – 2021-02-11 (×3): 650 mg via ORAL
  Filled 2021-02-10 (×3): qty 2

## 2021-02-10 MED ORDER — GABAPENTIN 300 MG PO CAPS
600.0000 mg | ORAL_CAPSULE | Freq: Every evening | ORAL | Status: DC | PRN
  Administered 2021-02-10 – 2021-02-11 (×2): 600 mg via ORAL
  Filled 2021-02-10 (×2): qty 2

## 2021-02-10 MED ORDER — ONDANSETRON HCL 4 MG/2ML IJ SOLN
4.0000 mg | Freq: Four times a day (QID) | INTRAMUSCULAR | Status: DC | PRN
  Administered 2021-02-11: 4 mg via INTRAVENOUS
  Filled 2021-02-10: qty 2

## 2021-02-10 MED ORDER — DEXTROSE IN LACTATED RINGERS 5 % IV SOLN: INTRAVENOUS | Status: DC

## 2021-02-10 MED ORDER — NICOTINE 21 MG/24HR TD PT24
21.0000 mg | MEDICATED_PATCH | Freq: Every day | TRANSDERMAL | Status: DC
  Administered 2021-02-10 – 2021-02-12 (×3): 21 mg via TRANSDERMAL
  Filled 2021-02-10 (×3): qty 1

## 2021-02-10 MED ORDER — LACTATED RINGERS IV BOLUS: 20.0000 mL/kg | Freq: Once | INTRAVENOUS | Status: DC

## 2021-02-10 MED ORDER — LACTATED RINGERS IV SOLN: INTRAVENOUS | Status: DC

## 2021-02-10 NOTE — Progress Notes (Signed)
PHARMACIST - PHYSICIAN COMMUNICATION  CONCERNING: Antibiotic IV to Oral Route Change Policy  RECOMMENDATION: This patient is receiving azithromycin by the intravenous route.  Based on criteria approved by the Pharmacy and Therapeutics Committee, the antibiotic(s) is/are being converted to the equivalent oral dose form(s).   DESCRIPTION: These criteria include: Patient being treated for a respiratory tract infection, urinary tract infection, cellulitis or clostridium difficile associated diarrhea if on metronidazole The patient is not neutropenic and does not exhibit a GI malabsorption state The patient is eating (either orally or via tube) and/or has been taking other orally administered medications for a least 24 hours The patient is improving clinically and has a Tmax < 100.5  If you have questions about this conversion, please contact the [place]  02/10/21

## 2021-02-10 NOTE — Progress Notes (Signed)
PROGRESS NOTE    Michaela Johnson  [id number] DOB: 1961/03/21 DOA: 02/10/2021 PCP: [name], NP  Brief Narrative: 60 year old female with history of COPD, tobacco abuse, uncontrolled diabetes mellitus, hypertension, paroxysmal atrial fibrillation on Xarelto, alcohol use presented to the ED with intractable nausea and vomiting, she reported starting Ozempic 2 days ago, at that time stopped her Lantus and NovoLog as she assumed this was being discontinued.  In addition also having cough congestion shortness of breath for 2 weeks, recently seen in urgent care and tested negative for COVID. -In the emergency room she was afebrile, blood glucose was 582 with an anion gap of 24, bicarb was 10, beta hydroxy butyrate acid was 8 -Chest x-ray noted reticulonodular opacities in the mid to lower lung reflecting interstitial edema versus pneumonia   Assessment & Plan:   Diabetic ketoacidosis -Anion gap has corrected will transition to Lantus and NovoLog sliding scale -I think patient misunderstood and discontinued her insulin 3 days ago when she was started on Ozempic -Transition to normal saline, advance diabetic diet -Diabetes coordinator consult  Community-acquired pneumonia -History of productive cough congestion for 2 weeks -Continue Rocephin and azithromycin, COVID PCR is negative  A. fib with RVR -Heart rate improved, continue diltiazem, Xarelto  COPD/tobacco abuse -Counseled, add duo nebs  Alcohol use disorder -Reportedly consumes 2-4 drinks per day -No evidence of overt withdrawal, hold off on Ativan, continue thiamine, monitor on CIWA protocol  Essential hypertension -Continue diltiazem   DVT prophylaxis: Xarelto Code Status: Full code Family Communication: No family at bedside Disposition Plan:  Status is: Inpatient  Remains inpatient appropriate because:Inpatient level of care appropriate due to severity of illness  Dispo: The patient is from: Home               Anticipated d/c is to: Home              Patient currently is not medically stable to d/c.   Difficult to place patient No  Consultants:    Procedures:   Antimicrobials:    Subjective: -Feels better today, nausea and vomiting has resolved, continues to have some congestion and cough  Objective: Vitals:   02/10/21 0804 02/10/21 0806 02/10/21 0900 02/10/21 1000  BP:   132/68 (!) 104/46  Pulse: 100  (!) 101 98  Resp:   18 15  Temp:  98.4 F (36.9 C) 98.7 F (37.1 C)   TempSrc:  Oral Oral   SpO2: 96%  97% 90%  Weight:   (P) 68.8 kg   Height:   (P) '5\' 2"'$  (1.575 m)     Intake/Output Summary (Last 24 hours) at 02/10/2021 1031 Last data filed at 02/10/2021 0901 Gross per 24 hour  Intake 3844.96 ml  Output --  Net 3844.96 ml   Filed Weights   02/09/21 2051 02/10/21 0900  Weight: 71.2 kg (P) 68.8 kg    Examination:  General exam: Chronically ill female appears older than stated age, AAOx3 CVS: S1-S2, regular rate rhythm Lungs: Poor air movement bilaterally, bronchial breath sounds bases Abdomen: Soft, nontender, bowel sounds present Extremities: No edema Skin: No rash on exposed skin Psych: Appropriate mood and affect   Data Reviewed:   CBC: Recent Labs  Lab 02/09/21 2053  WBC 12.8*  HGB 12.0  HCT 36.4  MCV 101.4*  PLT [id number]   Basic Metabolic Panel: Recent Labs  Lab 02/09/21 2145 02/10/21 0308 02/10/21 0912  NA 131* 135 135  K 5.3* 4.2 3.9  CL 97* 104 104  CO2 10* 13* 22  GLUCOSE 582* 416* 240*  BUN 31* 32* 29*  CREATININE 1.17* 1.35* 1.08*  CALCIUM 9.1 8.7* 8.4*  MG 2.3  --   --   PHOS 6.3*  --   --    GFR: Estimated Creatinine Clearance: 50.5 mL/min (A) (by C-G formula based on SCr of 1.08 mg/dL (H)). Liver Function Tests: Recent Labs  Lab 02/09/21 2145  AST 23  ALT 15  ALKPHOS 122  BILITOT 2.0*  PROT 7.8  ALBUMIN 3.8   Recent Labs  Lab 02/09/21 2145  LIPASE 20   No results for input(s): AMMONIA in the last 168 hours. Coagulation  Profile: No results for input(s): INR, PROTIME in the last 168 hours. Cardiac Enzymes: No results for input(s): CKTOTAL, CKMB, CKMBINDEX, TROPONINI in the last 168 hours. BNP (last 3 results) No results for input(s): PROBNP in the last 8760 hours. HbA1C: No results for input(s): HGBA1C in the last 72 hours. CBG: Recent Labs  Lab 02/10/21 0507 02/10/21 0620 02/10/21 0752 02/10/21 0908 02/10/21 1007  GLUCAP 281* 261* 200* 157* 231*   Lipid Profile: No results for input(s): CHOL, HDL, LDLCALC, TRIG, CHOLHDL, LDLDIRECT in the last 72 hours. Thyroid Function Tests: No results for input(s): TSH, T4TOTAL, FREET4, T3FREE, THYROIDAB in the last 72 hours. Anemia Panel: No results for input(s): VITAMINB12, FOLATE, FERRITIN, TIBC, IRON, RETICCTPCT in the last 72 hours. Urine analysis:    Component Value Date/Time   COLORURINE STRAW (A) 02/09/2021 2144   APPEARANCEUR CLEAR (A) 02/09/2021 2144   LABSPEC 1.021 02/09/2021 2144   PHURINE 5.0 02/09/2021 2144   GLUCOSEU >=500 (A) 02/09/2021 2144   HGBUR SMALL (A) 02/09/2021 2144   BILIRUBINUR NEGATIVE 02/09/2021 2144   KETONESUR 80 (A) 02/09/2021 2144   PROTEINUR 30 (A) 02/09/2021 2144   NITRITE NEGATIVE 02/09/2021 2144   LEUKOCYTESUR NEGATIVE 02/09/2021 2144   Sepsis Labs: '@LABRCNTIP'$ (procalcitonin:4,lacticidven:4)  ) Recent Results (from the past 240 hour(s))  Resp Panel by RT-PCR (Flu A&B, Covid) Nasopharyngeal Swab     Status: None   Collection Time: 02/10/21  3:21 AM   Specimen: Nasopharyngeal Swab; Nasopharyngeal(NP) swabs in vial transport medium  Result Value Ref Range Status   SARS Coronavirus 2 by RT PCR NEGATIVE NEGATIVE Final    Comment: (NOTE) SARS-CoV-2 target nucleic acids are NOT DETECTED.  The SARS-CoV-2 RNA is generally detectable in upper respiratory specimens during the acute phase of infection. The lowest concentration of SARS-CoV-2 viral copies this assay can detect is 138 copies/mL. A negative result does not  preclude SARS-Cov-2 infection and should not be used as the sole basis for treatment or other patient management decisions. A negative result may occur with  improper specimen collection/handling, submission of specimen other than nasopharyngeal swab, presence of viral mutation(s) within the areas targeted by this assay, and inadequate number of viral copies(<138 copies/mL). A negative result must be combined with clinical observations, patient history, and epidemiological information. The expected result is Negative.  Fact Sheet for Patients:  EntrepreneurPulse.com.au  Fact Sheet for Healthcare Providers:  IncredibleEmployment.be  This test is no t yet approved or cleared by the [place] FDA and  has been authorized for detection and/or diagnosis of SARS-CoV-2 by FDA under an Emergency Use Authorization (EUA). This EUA will remain  in effect (meaning this test can be used) for the duration of the COVID-19 declaration under Section 564(b)(1) of the Act, 21 U.S.C.section 360bbb-3(b)(1), unless the authorization is terminated  or revoked sooner.  Influenza A by PCR NEGATIVE NEGATIVE Final   Influenza B by PCR NEGATIVE NEGATIVE Final    Comment: (NOTE) The Xpert Xpress SARS-CoV-2/FLU/RSV plus assay is intended as an aid in the diagnosis of influenza from Nasopharyngeal swab specimens and should not be used as a sole basis for treatment. Nasal washings and aspirates are unacceptable for Xpert Xpress SARS-CoV-2/FLU/RSV testing.  Fact Sheet for Patients: EntrepreneurPulse.com.au  Fact Sheet for Healthcare Providers: IncredibleEmployment.be  This test is not yet approved or cleared by the [place] FDA and has been authorized for detection and/or diagnosis of SARS-CoV-2 by FDA under an Emergency Use Authorization (EUA). This EUA will remain in effect (meaning this test can be used) for the  duration of the COVID-19 declaration under Section 564(b)(1) of the Act, 21 U.S.C. section 360bbb-3(b)(1), unless the authorization is terminated or revoked.  Performed at [facility], [address] [place], [place] [address]          Radiology Studies: DG Chest Yale 1 View  Result Date: 02/10/2021 CLINICAL DATA:  DKA, emesis EXAM: PORTABLE CHEST 1 VIEW COMPARISON:  Radiograph 11/24/2019 FINDINGS: Increasing hazy reticular opacities are present in the mid to lower lungs with some mild fissural thickening and pulmonary vascular congestion. Could reflect developing edema though underlying infection or pneumonitis is not fully excluded. No pneumothorax. No visible effusion. Calcified aorta. Remaining cardiomediastinal contours are unremarkable for the portable technique. No acute or worrisome abnormality of the chest wall. Telemetry leads overlie the chest. IMPRESSION: Increasing hazy reticular opacities the mid to lower lungs, could reflect developing interstitial edema and atelectasis though underlying infection or pneumonitis is difficult to fully exclude. Electronically Signed   By: Lovena Le M.D.   On: 02/10/2021 01:16        Scheduled Meds:  Chlorhexidine Gluconate Cloth  6 each Topical Daily   folic acid  1 mg Oral Daily   insulin aspart  0-15 Units Subcutaneous TID WC   insulin aspart  0-5 Units Subcutaneous QHS   insulin glargine  25 Units Subcutaneous Daily   ipratropium-albuterol  3 mL Nebulization QID   multivitamin with minerals  1 tablet Oral Daily   nicotine  21 mg Transdermal Daily   Rivaroxaban  15 mg Oral Q supper   thiamine  100 mg Oral Daily   Or   thiamine  100 mg Intravenous Daily   Continuous Infusions:  sodium chloride     azithromycin Stopped (02/10/21 0509)   cefTRIAXone (ROCEPHIN)  IV Stopped (02/10/21 0406)   dextrose 5% lactated ringers 125 mL/hr at 02/10/21 0805   insulin 1.6 Units/hr (02/10/21 1007)     LOS: 0 days    Time  spent: 43mn  PDomenic Polite MD [facility]   02/10/2021, 10:31 AM

## 2021-02-10 NOTE — Progress Notes (Signed)
Inpatient Diabetes Program Recommendations  AACE/ADA: New Consensus Statement on Inpatient Glycemic Control   Target Ranges:  Prepandial:   less than 140 mg/dL      Peak postprandial:   less than 180 mg/dL (1-2 hours)      Critically ill patients:  140 - 180 mg/dL   Results for PROMIZE, AREVALOS (MRN [id number]) as of 02/10/2021 08:08  Ref. Range 02/09/2021 23:56 02/10/2021 01:00 02/10/2021 02:19 02/10/2021 03:18 02/10/2021 04:11 02/10/2021 05:07 02/10/2021 06:20 02/10/2021 07:52  Glucose-Capillary Latest Ref Range: 70 - 99 mg/dL 580 (HH) 575 (HH) 451 (H) 340 (H) 315 (H) 281 (H) 261 (H) 200 (H)  Results for RIKKIA, KUCERA (MRN [id number]) as of 02/10/2021 08:08  Ref. Range 02/09/2021 21:45  Beta-Hydroxybutyric Acid Latest Ref Range: 0.05 - 0.27 mmol/L >8.00 (H)  Glucose Latest Ref Range: 70 - 99 mg/dL 582 (HH)   Review of Glycemic Control  Diabetes history: DM2 Outpatient Diabetes medications: Lantus 45 units QAM, Novolog 4 units TID with meals, Ozempic 0.25 mg Qweek Current orders for Inpatient glycemic control: IV insulin  Inpatient Diabetes Program Recommendations:    Insulin: Once acidosis is resolved and provider is ready to transition from IV to SQ insulin, please consider ordering Lantus 20 units Q24H, CBGs Q4H, Novolog 0-9 units Q4H. If patient is ordered diet and able to tolerate PO intake, may also need Novolog 3 units TID with meals if patient eats at least 50% of meals.  Labs: Please consider ordering (add on) c-peptide and GAD antibodies.  NOTE: Noted consult for Diabetes Coordinator. Diabetes Coordinator is not on campus over the weekend but available by pager from 8am to 5pm for questions or concerns. Chart reviewed. Noted patient admitted with DKA and CAP with initial lab glucose of 582 mg/dl and patient was started on IV insulin which is still being used for glycemic control. Per H&P, patient "presenting with intractable nausea and vomiting starting the day after switching from her  Lantus and NovoLog to once weekly non-insulin (Ozempic)."  Diabetes coordinator will follow up with patient on Monday (02/11/21).  Thanks, Barnie Alderman, RN, MSN, CDE Diabetes Coordinator Inpatient Diabetes Program [phone] (Team Pager from 8am to 5pm)

## 2021-02-10 NOTE — H&P (Signed)
History and Physical    Michaela Johnson [id number] DOB: 19-Oct-1960 DOA: 02/10/2021  PCP: [name], NP   Patient coming from: Home  I have personally briefly reviewed patient's old medical records in [place]  Chief Complaint: Nausea and vomiting  HPI: Michaela Johnson is a 60 y.o. female with medical history significant for DM, HTN, paroxysmal A. fib on Xarelto, alcohol use disorder nicotine dependence, who presents to the ED with intractable nausea and vomiting that started on the day of arrival.  Patient reports switching from her Lantus and NovoLog to once weekly injectable (Ozempic)  the day prior.  She reports having a cough with shortness of breath for the last 2 to 3 weeks and was seen by the urgent care and tested negative for COVID.  She reports chest pain associated with the coughing and shortness of breath.  She denies abdominal pain and diarrhea and denies fever or chills.  Chest discomfort is low retrosternal and vomiting, nonradiating, nonexertional and of mild intensity.  Blood sugar with EMS was over 500  ED course: On arrival, afebrile, pulse 104 BP 137/66 respirations 24 and O2 sat 100% on room air Blood work significant for blood glucose 582 with anion gap of 24, bicarb of 10, pH 7.09 with beta hydroxybutyric acid of 8. LFTs and lipase within normal limits, urinalysis unremarkable.  WBC is 12,800 hemoglobin 12, creatinine 1.17 above baseline of 0.69  EKG, personally viewed and interpreted: A. fib at 112 with a left bundle branch block, old  Imaging: Chest x-ray with increasing reticular opacities to mid and lower lungs could reflect developing interstitial edema no underlying infection or pneumonitis difficult to fully exclude  Patient started on insulin infusion for treatment of DKA.  Hospitalist consulted for admission.  Review of Systems: As per HPI otherwise all other systems on review of systems negative.    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 07/30/2020   Constipation    Depression    Diabetes mellitus without complication (HCC)    Dysrhythmia     PAF   GERD (gastroesophageal reflux disease)    Hypertension    Neuropathy    Nicotine dependence 07/31/2020   PAF (paroxysmal atrial fibrillation) (HCC)    Panic attacks    Pneumonia    Tobacco use    Vitamin D deficiency     Past Surgical History:  Procedure Laterality Date   ANKLE FUSION Right 07/30/2020   Procedure: ANKLE FUSION;  Surgeon: Altamese Santee, MD;  Location: [facility];  Service: Orthopedics;  Laterality: Right;   APPLICATION OF WOUND VAAPPLICATION OF WOUND VAC (Right Ankle)C (Right Ankle)  [id number]   APPLICATION OF WOUND VAC Right 07/30/2020   Procedure: APPLICATION OF WOUND VAC;  Surgeon: Altamese Bryant, MD;  Location: [facility];  Service: Orthopedics;  Laterality: Right;   HARDWARE REMOVAL Right 07/30/2020   Procedure: HARDWARE REMOVAL RIGHT ANKLE;  Surgeon: Altamese , MD;  Location: [place];  Service: Orthopedics;  Laterality: Right;   HARDWARE REMOVAL RIGHT ANKLE (Right Ankle  07/31/2020   ORIF ANKLE FRACTURE Right 07/04/2020   Procedure: OPEN REDUCTION INTERNAL FIXATION (ORIF) ANKLE FRACTURE;  Surgeon: Lovell Sheehan, MD;  Location: [facility];  Service: Orthopedics;  Laterality: Right;     reports that she has been  smoking cigarettes. She has a 41.00 pack-year smoking history. She has never used smokeless tobacco. She reports current alcohol use of about 6.0 standard drinks of alcohol per week. She reports current drug use. Frequency: 7.00 times per week. Drug: Marijuana.  Allergies  Allergen Reactions   Codeine Itching   Levaquin [Levofloxacin In D5w] Itching    Family History  Problem Relation Age of Onset   Diabetes Mother    CAD Father    Heart Problems Father    Heart  failure Father       Prior to Admission medications   Medication Sig Start Date End Date Taking? Authorizing Provider  acetaminophen (TYLENOL) 325 MG tablet TAKE 1-2 TABLETS (325-650 MG TOTAL) BY MOUTH EVERY 6 (SIX) HOURS AS NEEDED FOR MILD PAIN (PAIN SCORE 1-3 OR TEMP > 100.5). 08/03/20 08/03/21  Ainsley Spinner, PA-C  albuterol (PROVENTIL HFA;VENTOLIN HFA) 108 (90 Base) MCG/ACT inhaler Inhale 2 puffs into the lungs every 6 (six) hours as needed for wheezing or shortness of breath. 02/14/18   Saundra Shelling, MD  Cholecalciferol (VITAMIN D) 125 MCG (5000 UT) CAPS Take 1 capsule by mouth daily. 08/03/20   Ainsley Spinner, PA-C  Cholecalciferol 125 MCG (5000 UT) TABS TAKE 1 CAPSULE BY MOUTH DAILY. 08/03/20 08/03/21  Ainsley Spinner, PA-C  diltiazem (CARDIZEM CD) 240 MG 24 hr capsule Take 1 capsule (240 mg total) by mouth daily. 04/05/20 07/04/20  Rise Mu, PA-C  diphenhydrAMINE (BENADRYL) 12.5 MG/5ML liquid Take 25 mg by mouth 4 (four) times daily as needed for itching.    [provider]  docusate sodium (COLACE) 100 MG capsule TAKE 1 CAPSULE (100 MG TOTAL) BY MOUTH 2 (TWO) TIMES DAILY. 08/03/20 08/03/21  Ainsley Spinner, PA-C  doxycycline (VIBRA-TABS) 100 MG tablet TAKE 1 TABLET (100 MG TOTAL) BY MOUTH 2 (TWO) TIMES DAILY FOR 21 DAYS. 08/03/20 08/03/21  Ainsley Spinner, PA-C  FLUoxetine (PROZAC) 40 MG capsule Take 40 mg by mouth daily.    [provider]  gabapentin (NEURONTIN) 300 MG capsule Take 600 mg by mouth at bedtime as needed (Neuropathy). 02/28/20   [provider]  insulin aspart (NOVOLOG) 100 UNIT/ML injection Inject 4 Units into the skin 3 (three) times daily with meals.    [provider]  insulin glargine (LANTUS) 100 UNIT/ML injection Inject 46 Units into the skin in the morning.    [provider]  lisinopril (ZESTRIL) 40 MG tablet Take 1 tablet (40 mg total) by mouth daily. 03/09/19   Minna Merritts, MD  Multiple Vitamins-Minerals (CERTAVITE/ANTIOXIDANTS) TABS  TAKE 1 TABLET BY MOUTH DAILY 08/03/20 08/03/21  Altamese [place], MD  omeprazole (PRILOSEC) 20 MG capsule Take 20 mg by mouth daily.    [provider]  rivaroxaban (XARELTO) 20 MG TABS tablet Take 1 tablet (20 mg total) by mouth daily with supper. Patient taking differently: Take 20 mg by mouth daily. 03/09/19   Minna Merritts, MD  vitamin C (ASCORBIC ACID) 500 MG tablet TAKE 1 TABLET (500 MG TOTAL) BY MOUTH DAILY. 08/03/20 08/03/21  Ainsley Spinner, PA-C    Physical Exam: Vitals:   02/09/21 2051 02/09/21 2313 02/10/21 0028  BP: 137/66  131/63  Pulse: (!) 104 (!) 114 (!) 111  Resp: (!) 24 (!) 26 (!) 25  Temp: 98.3 F (36.8 C)    TempSrc: Oral    SpO2: 100%  94%  Weight: 71.2 kg    Height: '5\' 2"'$  (1.575 m)       Vitals:   02/09/21  2051 02/09/21 2313 02/10/21 0028  BP: 137/66  131/63  Pulse: (!) 104 (!) 114 (!) 111  Resp: (!) 24 (!) 26 (!) 25  Temp: 98.3 F (36.8 C)    TempSrc: Oral    SpO2: 100%  94%  Weight: 71.2 kg    Height: '5\' 2"'$  (1.575 m)        Constitutional: Alert and oriented x 3 . Not in any apparent distress HEENT:      Head: Normocephalic and atraumatic.         Eyes: PERLA, EOMI, Conjunctivae are normal. Sclera is non-icteric.       Mouth/Throat: Mucous membranes are moist.       Neck: Supple with no signs of meningismus. Cardiovascular: Regular rate and rhythm. No murmurs, gallops, or rubs. 2+ symmetrical distal pulses are present . No JVD. No LE edema Respiratory: Respiratory effort normal .Lungs sounds clear bilaterally. No wheezes, crackles, or rhonchi.  Gastrointestinal: Soft, non tender, and non distended with positive bowel sounds.  Genitourinary: No CVA tenderness. Musculoskeletal: Nontender with normal range of motion in all extremities. No cyanosis, or erythema of extremities. Neurologic:  Face is symmetric. Moving all extremities. No gross focal neurologic deficits. mild tremor. Skin: Skin is warm, dry.  No rash or ulcers Psychiatric: Mood and  affect are normal    Labs on Admission: I have personally reviewed following labs and imaging studies  CBC: Recent Labs  Lab 02/09/21 2053  WBC 12.8*  HGB 12.0  HCT 36.4  MCV 101.4*  PLT [id number]   Basic Metabolic Panel: Recent Labs  Lab 02/09/21 2145  NA 131*  K 5.3*  CL 97*  CO2 10*  GLUCOSE 582*  BUN 31*  CREATININE 1.17*  CALCIUM 9.1  MG 2.3  PHOS 6.3*   GFR: Estimated Creatinine Clearance: 46.6 mL/min (A) (by C-G formula based on SCr of 1.17 mg/dL (H)). Liver Function Tests: Recent Labs  Lab 02/09/21 2145  AST 23  ALT 15  ALKPHOS 122  BILITOT 2.0*  PROT 7.8  ALBUMIN 3.8   Recent Labs  Lab 02/09/21 2145  LIPASE 20   No results for input(s): AMMONIA in the last 168 hours. Coagulation Profile: No results for input(s): INR, PROTIME in the last 168 hours. Cardiac Enzymes: No results for input(s): CKTOTAL, CKMB, CKMBINDEX, TROPONINI in the last 168 hours. BNP (last 3 results) No results for input(s): PROBNP in the last 8760 hours. HbA1C: No results for input(s): HGBA1C in the last 72 hours. CBG: Recent Labs  Lab 02/09/21 2356 02/10/21 0100  GLUCAP 580* 575*   Lipid Profile: No results for input(s): CHOL, HDL, LDLCALC, TRIG, CHOLHDL, LDLDIRECT in the last 72 hours. Thyroid Function Tests: No results for input(s): TSH, T4TOTAL, FREET4, T3FREE, THYROIDAB in the last 72 hours. Anemia Panel: No results for input(s): VITAMINB12, FOLATE, FERRITIN, TIBC, IRON, RETICCTPCT in the last 72 hours. Urine analysis:    Component Value Date/Time   COLORURINE STRAW (A) 02/09/2021 2144   APPEARANCEUR CLEAR (A) 02/09/2021 2144   LABSPEC 1.021 02/09/2021 2144   PHURINE 5.0 02/09/2021 2144   GLUCOSEU >=500 (A) 02/09/2021 2144   HGBUR SMALL (A) 02/09/2021 2144   BILIRUBINUR NEGATIVE 02/09/2021 2144   KETONESUR 80 (A) 02/09/2021 2144   PROTEINUR 30 (A) 02/09/2021 2144   NITRITE NEGATIVE 02/09/2021 2144   LEUKOCYTESUR NEGATIVE 02/09/2021 2144    Radiological  Exams on Admission: DG Chest Port 1 View  Result Date: 02/10/2021 CLINICAL DATA:  DKA, emesis EXAM: PORTABLE CHEST 1 VIEW  COMPARISON:  Radiograph 11/24/2019 FINDINGS: Increasing hazy reticular opacities are present in the mid to lower lungs with some mild fissural thickening and pulmonary vascular congestion. Could reflect developing edema though underlying infection or pneumonitis is not fully excluded. No pneumothorax. No visible effusion. Calcified aorta. Remaining cardiomediastinal contours are unremarkable for the portable technique. No acute or worrisome abnormality of the chest wall. Telemetry leads overlie the chest. IMPRESSION: Increasing hazy reticular opacities the mid to lower lungs, could reflect developing interstitial edema and atelectasis though underlying infection or pneumonitis is difficult to fully exclude. Electronically Signed   By: Lovena Le M.D.   On: 02/10/2021 01:16     Assessment/Plan 60 year old female with history of insulin dependent DM, HTN, paroxysmal A. fib on Xarelto, nicotine dependence, presenting with intractable nausea and vomiting starting the day after switching from her Lantus and NovoLog to once weekly non-insulin (Ozempic) .     DKA (diabetic ketoacidosis)  - Continue insulin infusion per Endo tool - IV hydration,  per Endo tool - Potassium repletion - Supportive care, IV antiemetics - Clear liquid diet to advance as tolerated - Diabetic coordinator consult  CAP - Patient with 2 to 3 weeks of cough, shortness of breath and chest pain - Chest x-ray showing possible underlying infection or pneumonitis - Rocephin and azithromycin - Follow procalcitonin to determine need to continue antibiotics - COVID PCR negative  Alcohol use disorder - Patient drinks about 4 drinks daily - CIWA withdrawal protocol    Atrial fibrillation with rapid ventricular response - Chronic anticoagulation - Mild RVR of 112, likely related to acute condition - Continue  home diltiazem as tolerated - Should improve with correction of DKA and hydration - Continue home Xarelto    Nicotine dependence - Nicotine patch    Essential hypertension - Continue diltiazem    DVT prophylaxis: Xarelto Code Status: full code  Family Communication:  none  Disposition Plan: Back to previous home environment Consults called: none  Status:At the time of admission, it appears that the appropriate admission status for this patient is INPATIENT. This is judged to be reasonable and necessary in order to provide the required intensity of service to ensure the patient's safety given the presenting symptoms, physical exam findings, and initial radiographic and laboratory data in the context of their  Comorbid conditions.   Patient requires inpatient status due to high intensity of service, high risk for further deterioration and high frequency of surveillance required.   I certify that at the point of admission it is my clinical judgment that the patient will require inpatient hospital care spanning beyond [place] MD [facility]     02/10/2021, 1:29 AM

## 2021-02-10 NOTE — ED Notes (Addendum)
Pt used bedpan and was able to urinate but this RN was unable to obtain urine sample due to her placing the toilet paper into the bedpan. Will try to collect when she urinates again.

## 2021-02-10 NOTE — ED Provider Notes (Signed)
[facility] Emergency Department Provider Note   ____________________________________________   Event Date/Time   First MD Initiated Contact with Patient 02/10/21 0021     (approximate)  I have reviewed the triage vital signs and the nursing notes.   HISTORY  Chief Complaint Vomiting    HPI Michaela Johnson is a 60 y.o. female with past medical history of hypertension, diabetes, seizures, and paroxysmal atrial fibrillation on Xarelto who presents to the ED complaining of nausea and vomiting.  Patient reports that she has been persistently vomiting ever since this morning, has been unable to keep anything down since then.  She reports pain in her epigastrium that has come on since she started vomiting, denies any associated diarrhea or dysuria.  She does state that she has been coughing with some mild shortness of breath and soreness in her chest for about the past week.  She denies any fevers and the cough has been nonproductive.  She states she has been compliant with her diabetic regimen, reports being taken off of insulin 2 days ago and transition to a once daily injection for her diabetes.        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 07/30/2020   Constipation    Depression    Diabetes mellitus without complication ([place])    Dysrhythmia     PAF   GERD (gastroesophageal reflux disease)    Hypertension    Neuropathy    Nicotine dependence 07/31/2020   PAF (paroxysmal atrial fibrillation) ([place])    Panic attacks    Pneumonia    Tobacco use    Vitamin D deficiency     Patient Active Problem List   Diagnosis Date Noted   Community acquired pneumonia 02/10/2021   Alcohol use disorder, moderate, dependence ([place]) 02/10/2021   Vitamin D deficiency    Neuropathy     Chronic anticoagulation 07/31/2020   Chronic alcohol use 07/31/2020   Nicotine dependence 07/31/2020   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) 03/18/2018   Diabetes ([place]) 03/18/2018   H/O medication noncompliance 02/07/2018   PAF (paroxysmal atrial fibrillation) ([place]) 01/30/2018   Atrial fibrillation with rapid ventricular response ([place]) 01/30/2018   Seizure ([place])    DKA (diabetic ketoacidosis) ([place]) 06/07/2016   Essential hypertension 05/05/2016    Past Surgical History:  Procedure Laterality Date   ANKLE FUSION Right 07/30/2020   Procedure: ANKLE FUSION;  Surgeon: Altamese Colton, MD;  Location: [place];  Service: Orthopedics;  Laterality: Right;   APPLICATION OF WOUND VAAPPLICATION OF WOUND VAC (Right Ankle)C (Right Ankle)  [id number]   APPLICATION OF WOUND VAC Right 07/30/2020   Procedure: APPLICATION OF WOUND VAC;  Surgeon: Altamese Meadow, MD;  Location: [place];  Service: Orthopedics;  Laterality: Right;   HARDWARE REMOVAL Right 07/30/2020   Procedure: HARDWARE REMOVAL RIGHT ANKLE;  Surgeon: Altamese , MD;  Location: [place];  Service: Orthopedics;  Laterality: Right;   HARDWARE REMOVAL RIGHT ANKLE (Right Ankle  07/31/2020   ORIF ANKLE FRACTURE Right 07/04/2020   Procedure: OPEN REDUCTION INTERNAL FIXATION (ORIF) ANKLE FRACTURE;  Surgeon: Lovell Sheehan, MD;  Location: [facility];  Service: Orthopedics;  Laterality: Right;    Prior to Admission medications   Medication Sig Start Date  End Date Taking? Authorizing Provider  acetaminophen (TYLENOL) 325 MG tablet TAKE 1-2 TABLETS (325-650 MG TOTAL) BY MOUTH EVERY 6 (SIX) HOURS AS NEEDED FOR MILD PAIN (PAIN SCORE 1-3 OR TEMP > 100.5). 08/03/20 08/03/21 Yes Ainsley Spinner, PA-C  albuterol (PROVENTIL HFA;VENTOLIN HFA) 108 (90 Base) MCG/ACT inhaler Inhale 2 puffs into the lungs every 6 (six) hours as needed for wheezing or shortness of breath. 02/14/18  Yes Pyreddy, [place], MD   amLODipine-benazepril (LOTREL) 5-40 MG capsule Take 1 capsule by mouth daily. 02/06/21  Yes [provider]  Cholecalciferol (VITAMIN D) 125 MCG (5000 UT) CAPS Take 1 capsule by mouth daily. 08/03/20  Yes Ainsley Spinner, PA-C  diltiazem (CARDIZEM CD) 240 MG 24 hr capsule Take 1 capsule (240 mg total) by mouth daily. 04/05/20 02/10/21 Yes Dunn, Areta Haber, PA-C  diphenhydrAMINE (BENADRYL) 12.5 MG/5ML liquid Take 25 mg by mouth 4 (four) times daily as needed for itching.   Yes [provider]  docusate sodium (COLACE) 100 MG capsule TAKE 1 CAPSULE (100 MG TOTAL) BY MOUTH 2 (TWO) TIMES DAILY. 08/03/20 08/03/21 Yes Ainsley Spinner, PA-C  FLUoxetine (PROZAC) 40 MG capsule Take 40 mg by mouth daily.   Yes [provider]  gabapentin (NEURONTIN) 300 MG capsule Take 600 mg by mouth at bedtime as needed (Neuropathy). 02/28/20  Yes [provider]  insulin aspart (NOVOLOG) 100 UNIT/ML injection Inject 4 Units into the skin 3 (three) times daily with meals.   Yes [provider]  insulin glargine (LANTUS) 100 UNIT/ML injection Inject 45 Units into the skin in the morning.   Yes [provider]  Multiple Vitamins-Minerals (CERTAVITE/ANTIOXIDANTS) TABS TAKE 1 TABLET BY MOUTH DAILY 08/03/20 08/03/21 Yes Altamese Cody, MD  omeprazole (PRILOSEC) 20 MG capsule Take 20 mg by mouth daily.   Yes [provider]  OZEMPIC, 0.25 OR 0.5 MG/DOSE, 2 MG/1.5ML SOPN Inject 0.25 mg into the skin every 7 (seven) days. 02/06/21  Yes [provider]  rivaroxaban (XARELTO) 20 MG TABS tablet Take 1 tablet (20 mg total) by mouth daily with supper. Patient taking differently: Take 20 mg by mouth daily. 03/09/19  Yes Gollan, Kathlene November, MD  vitamin C (ASCORBIC ACID) 500 MG tablet TAKE 1 TABLET (500 MG TOTAL) BY MOUTH DAILY. 08/03/20 08/03/21 Yes Ainsley Spinner, PA-C  Cholecalciferol 125 MCG (5000 UT) TABS TAKE 1 CAPSULE BY MOUTH DAILY. Patient not taking: Reported on 02/10/2021 08/03/20  08/03/21  Ainsley Spinner, PA-C  doxycycline (VIBRA-TABS) 100 MG tablet TAKE 1 TABLET (100 MG TOTAL) BY MOUTH 2 (TWO) TIMES DAILY FOR 21 DAYS. Patient not taking: Reported on 02/10/2021 08/03/20 08/03/21  Ainsley Spinner, PA-C  lisinopril (ZESTRIL) 40 MG tablet Take 1 tablet (40 mg total) by mouth daily. Patient not taking: Reported on 02/10/2021 03/09/19   Minna Merritts, MD    Allergies Codeine and Levaquin [levofloxacin in d5w]  Family History  Problem Relation Age of Onset   Diabetes Mother    CAD Father    Heart Problems Father    Heart failure Father     Social History Social History   Tobacco Use   Smoking status: Some Days    Packs/day: 1.00    Years: 41.00    Pack years: 41.00    Types: Cigarettes   Smokeless tobacco: Never  Vaping Use   Vaping Use: Never used  Substance Use Topics   Alcohol use: Yes    Alcohol/week: 6.0 standard drinks    Types: 6 Standard drinks or equivalent per  week   Drug use: Yes    Frequency: 7.0 times per week    Types: Marijuana    Review of Systems  Constitutional: No fever/chills Eyes: No visual changes. ENT: No sore throat. Cardiovascular: Positive for chest pain. Respiratory: Positive for cough and shortness of breath. Gastrointestinal: Positive for abdominal pain, nausea, and vomiting.  No diarrhea.  No constipation. Genitourinary: Negative for dysuria. Musculoskeletal: Negative for back pain. Skin: Negative for rash. Neurological: Negative for headaches, focal weakness or numbness.  ____________________________________________   PHYSICAL EXAM:  VITAL SIGNS: ED Triage Vitals [02/09/21 2051]  Enc Vitals Group     BP 137/66     Pulse Rate (!) 104     Resp (!) 24     Temp 98.3 F (36.8 C)     Temp Source Oral     SpO2 100 %     Weight 157 lb (71.2 kg)     Height '5\' 2"'$  (1.575 m)     Head Circumference      Peak Flow      Pain Score      Pain Loc      Pain Edu?      Excl. in [place]?     Constitutional: Alert and  oriented. Eyes: Conjunctivae are normal. Head: Atraumatic. Nose: No congestion/rhinnorhea. Mouth/Throat: Mucous membranes are moist. Neck: Normal ROM Cardiovascular: Tachycardic, irregularly irregular rhythm. Grossly normal heart sounds. Respiratory: Normal respiratory effort.  No retractions. Lungs CTAB.  2+ radial pulses bilaterally. Gastrointestinal: Soft and nontender. No distention. Genitourinary: deferred Musculoskeletal: No lower extremity tenderness nor edema. Neurologic:  Normal speech and language. No gross focal neurologic deficits are appreciated. Skin:  Skin is warm, dry and intact. No rash noted. Psychiatric: Mood and affect are normal. Speech and behavior are normal.  ____________________________________________   LABS (all labs ordered are listed, but only abnormal results are displayed)  Labs Reviewed  CBC - Abnormal; Notable for the following components:      Result Value   WBC 12.8 (*)    RBC 3.59 (*)    MCV 101.4 (*)    All other components within normal limits  URINALYSIS, COMPLETE (UACMP) WITH MICROSCOPIC - Abnormal; Notable for the following components:   Color, Urine STRAW (*)    APPearance CLEAR (*)    Glucose, UA >=500 (*)    Hgb urine dipstick SMALL (*)    Ketones, ur 80 (*)    Protein, ur 30 (*)    All other components within normal limits  COMPREHENSIVE METABOLIC PANEL - Abnormal; Notable for the following components:   Sodium 131 (*)    Potassium 5.3 (*)    Chloride 97 (*)    CO2 10 (*)    Glucose, Bld 582 (*)    BUN 31 (*)    Creatinine, Ser 1.17 (*)    Total Bilirubin 2.0 (*)    GFR, Estimated 53 (*)    Anion gap 24 (*)    All other components within normal limits  BLOOD GAS, VENOUS - Abnormal; Notable for the following components:   pH, Ven 7.09 (*)    pCO2, Ven 32 (*)    pO2, Ven 70.0 (*)    Bicarbonate 9.7 (*)    Acid-base deficit 19.0 (*)    All other components within normal limits  BETA-HYDROXYBUTYRIC ACID - Abnormal; Notable  for the following components:   Beta-Hydroxybutyric Acid >8.00 (*)    All other components within normal limits  PHOSPHORUS - Abnormal; Notable for the  following components:   Phosphorus 6.3 (*)    All other components within normal limits  BASIC METABOLIC PANEL - Abnormal; Notable for the following components:   CO2 13 (*)    Glucose, Bld 416 (*)    BUN 32 (*)    Creatinine, Ser 1.35 (*)    Calcium 8.7 (*)    GFR, Estimated 45 (*)    Anion gap 18 (*)    All other components within normal limits  CBG MONITORING, ED - Abnormal; Notable for the following components:   Glucose-Capillary 580 (*)    All other components within normal limits  CBG MONITORING, ED - Abnormal; Notable for the following components:   Glucose-Capillary 575 (*)    All other components within normal limits  CBG MONITORING, ED - Abnormal; Notable for the following components:   Glucose-Capillary 451 (*)    All other components within normal limits  CBG MONITORING, ED - Abnormal; Notable for the following components:   Glucose-Capillary 340 (*)    All other components within normal limits  CBG MONITORING, ED - Abnormal; Notable for the following components:   Glucose-Capillary 315 (*)    All other components within normal limits  RESP PANEL BY RT-PCR (FLU A&B, COVID) ARPGX2  LIPASE, BLOOD  MAGNESIUM  PROCALCITONIN  HEMOGLOBIN A1C  HIV ANTIBODY (ROUTINE TESTING W REFLEX)  TROPONIN I (HIGH SENSITIVITY)   ____________________________________________  EKG  ED ECG REPORT I, Blake Divine, the attending physician, personally viewed and interpreted this ECG.   Date: 02/10/2021  EKG Time: 00:38  Rate: 112  Rhythm: atrial fibrillation  Axis: Normal  Intervals:left bundle branch block  ST&T Change: None, negative sgarbossa    PROCEDURES  Procedure(s) performed (including Critical Care):  .Critical Care  Date/Time: 02/10/2021 12:36 AM Performed by: Blake Divine, MD Authorized by: Blake Divine, MD   Critical care provider statement:    Critical care time (minutes):  45   Critical care time was exclusive of:  Separately billable procedures and treating other patients and teaching time   Critical care was necessary to treat or prevent imminent or life-threatening deterioration of the following conditions:  Metabolic crisis and endocrine crisis   Critical care was time spent personally by me on the following activities:  Discussions with consultants, evaluation of patient's response to treatment, examination of patient, ordering and performing treatments and interventions, ordering and review of laboratory studies, ordering and review of radiographic studies, pulse oximetry, re-evaluation of patient's condition, obtaining history from patient or surrogate and review of old charts   I assumed direction of critical care for this patient from another provider in my specialty: no     Care discussed with: admitting provider     ____________________________________________   INITIAL IMPRESSION / [place] / ED COURSE      60 year old female with past medical story of hypertension, diabetes, seizures, and paroxysmal atrial fibrillation on Xarelto who presents to the ED complaining of persistent nausea and vomiting over the course of the day today along with 1 week of chest pain, cough, and mild shortness of breath.  Labs from triage are remarkable for DKA and IV fluids initiated along with insulin drip.  Labs also show AKI with mild hyperkalemia, which should improve with IV fluids and insulin.  Patient noted to be in atrial fibrillation with RVR although rate is only mildly elevated and I would like to see how she responds to additional IV fluids before starting her on rate control medication.  No ischemic changes noted on EKG, low suspicion for ACS or PE.  Chest x-ray reviewed by me and shows bilateral hazy opacities.  Findings discussed with hospitalist and we will cover with  antibiotics for community-acquired pneumonia for now.  Case discussed with hospitalist for admission.      ____________________________________________   FINAL CLINICAL IMPRESSION(S) / ED DIAGNOSES  Final diagnoses:  Diabetic ketoacidosis without coma associated with type 1 diabetes mellitus ([place])  Atrial fibrillation with RVR [facility])     ED Discharge Orders     None        Note:  This document was prepared using Dragon voice recognition software and may include unintentional dictation errors.    Blake Divine, MD 02/10/21 [phone]

## 2021-02-10 NOTE — Progress Notes (Signed)
Chaplain provided AD materials and education to pt.  Pt may request a chaplain's support to complete AD process during business hours.    [place], [place] Pager: [phone]    02/10/21 1625  Clinical Encounter Type  Visited With Patient  Visit Type Initial (AD materials)  Referral From Nurse  Consult/Referral To Chaplain  Stress Factors  Patient Stress Factors Not reviewed

## 2021-02-10 NOTE — ED Notes (Signed)
Admitting at bedside 

## 2021-02-11 DIAGNOSIS — Z7901 Long term (current) use of anticoagulants: Secondary | ICD-10-CM

## 2021-02-11 DIAGNOSIS — F1020 Alcohol dependence, uncomplicated: Secondary | ICD-10-CM

## 2021-02-11 DIAGNOSIS — I4891 Unspecified atrial fibrillation: Secondary | ICD-10-CM

## 2021-02-11 LAB — URINALYSIS, COMPLETE (UACMP) WITH MICROSCOPIC
Bilirubin Urine: NEGATIVE
Glucose, UA: >=500 mg/dL — AB
Ketones, ur: 20 mg/dL — AB
Leukocytes,Ua: NEGATIVE
Nitrite: NEGATIVE
Protein, ur: NEGATIVE mg/dL
Specific Gravity, Urine: 1.014 (ref 1.005–1.030)
pH: 6 (ref 5.0–8.0)

## 2021-02-11 LAB — BASIC METABOLIC PANEL
Anion gap: 13 (ref 5–15)
BUN: 15 mg/dL (ref 8–23)
CO2: 22 mmol/L (ref 22–32)
Calcium: 8.7 mg/dL — ABNORMAL LOW (ref 8.9–10.3)
Chloride: 102 mmol/L (ref 98–111)
Creatinine, Ser: 0.78 mg/dL (ref 0.44–1.00)
GFR, Estimated: >60 mL/min (ref >60)
Glucose, Bld: 360 mg/dL — ABNORMAL HIGH (ref 70–99)
Potassium: 4.5 mmol/L (ref 3.5–5.1)
Sodium: 137 mmol/L (ref 135–145)

## 2021-02-11 LAB — CBC
HCT: 36.2 % (ref 36.0–46.0)
Hemoglobin: 12 g/dL (ref 12.0–15.0)
MCH: 31.9 pg (ref 26.0–34.0)
MCHC: 33.1 g/dL (ref 30.0–36.0)
MCV: 96.3 fL (ref 80.0–100.0)
Platelets: 264 10*3/uL (ref 150–400)
RBC: 3.76 MIL/uL — ABNORMAL LOW (ref 3.87–5.11)
RDW: 13.7 % (ref 11.5–15.5)
WBC: 9.6 10*3/uL (ref 4.0–10.5)
nRBC: 0 % (ref 0.0–0.2)

## 2021-02-11 LAB — GLUCOSE, CAPILLARY
Glucose-Capillary: 175 mg/dL — ABNORMAL HIGH (ref 70–99)
Glucose-Capillary: 314 mg/dL — ABNORMAL HIGH (ref 70–99)
Glucose-Capillary: 211 mg/dL — ABNORMAL HIGH (ref 70–99)
Glucose-Capillary: 195 mg/dL — ABNORMAL HIGH (ref 70–99)
Glucose-Capillary: 157 mg/dL — ABNORMAL HIGH (ref 70–99)
Glucose-Capillary: 96 mg/dL (ref 70–99)

## 2021-02-11 LAB — HEMOGLOBIN A1C
Hgb A1c MFr Bld: 10.3 % — ABNORMAL HIGH (ref 4.8–5.6)
Mean Plasma Glucose: 249 mg/dL

## 2021-02-11 MED ORDER — INSULIN GLARGINE 100 UNIT/ML ~~LOC~~ SOLN
30.0000 [IU] | Freq: Every day | SUBCUTANEOUS | Status: DC
  Administered 2021-02-11 – 2021-02-12 (×2): 30 [IU] via SUBCUTANEOUS
  Filled 2021-02-11 (×3): qty 0.3

## 2021-02-11 MED ORDER — IPRATROPIUM-ALBUTEROL 0.5-2.5 (3) MG/3ML IN SOLN
3.0000 mL | Freq: Three times a day (TID) | RESPIRATORY_TRACT | Status: DC
  Administered 2021-02-11 – 2021-02-12 (×4): 3 mL via RESPIRATORY_TRACT
  Filled 2021-02-11 (×4): qty 3

## 2021-02-11 MED ORDER — PANTOPRAZOLE SODIUM 40 MG PO TBEC
40.0000 mg | DELAYED_RELEASE_TABLET | Freq: Every day | ORAL | Status: DC
  Administered 2021-02-11 – 2021-02-12 (×2): 40 mg via ORAL
  Filled 2021-02-11 (×2): qty 1

## 2021-02-11 MED ORDER — INSULIN ASPART 100 UNIT/ML IJ SOLN
5.0000 [IU] | Freq: Three times a day (TID) | INTRAMUSCULAR | Status: DC
  Administered 2021-02-11 – 2021-02-12 (×4): 5 [IU] via SUBCUTANEOUS
  Filled 2021-02-11 (×4): qty 1

## 2021-02-11 NOTE — Progress Notes (Signed)
   02/11/21 0910  Clinical Encounter Type  Visited With Patient and family together  Visit Type Follow-up  Referral From Nurse  Consult/Referral To [place] did a follow up visit for ICU-02 Pt, Michaela Johnson. Pt and family member stated they have a booklet already and wanted to know more about the document. I did one AD education and informed them they can contact our [facility] if they choose to complete the document.

## 2021-02-11 NOTE — Plan of Care (Signed)

## 2021-02-11 NOTE — Progress Notes (Signed)
Inpatient Diabetes Program Recommendations  AACE/ADA: New Consensus Statement on Inpatient Glycemic Control (2015)  Target Ranges:  Prepandial:   less than 140 mg/dL      Peak postprandial:   less than 180 mg/dL (1-2 hours)      Critically ill patients:  140 - 180 mg/dL   Lab Results  Component Value Date   GLUCAP 211 (H) 02/11/2021   HGBA1C 10.3 (H) 02/09/2021    Review of Glycemic Control Results for [name], Michaela Johnson (MRN [id number]) as of 02/11/2021 15:48  Ref. Range 02/10/2021 14:08 02/10/2021 16:35 02/10/2021 20:59 02/11/2021 07:36 02/11/2021 11:29  Glucose-Capillary Latest Ref Range: 70 - 99 mg/dL 213 (H) 150 (H) 196 (H) 314 (H) 211 (H)  Diabetes history: DM2 Outpatient Diabetes medications: Lantus 45 units QAM, Novolog 4 units TID with meals, Ozempic 0.25 mg Qweek Current orders for Inpatient glycemic control:  Novolog moderate tid with meals and HS Novolog 5 units tid with meals Lantus 30 units daily Inpatient Diabetes Program Recommendations:    Spoke with patient by phone.  She states that she started taking Ozempic on Friday 7/22 and woke up vomiting on 7/23.  She had stopped taking all her insulin thinking that the Ozempic replaced the insulin.  She states that she is not sure if this was what the doctor said, she just assumed.  We discussed that she definitely needs to take insulin in addition to Ozempic.  It is likely that the nausea/vomiting was due to acidosis and not from the Ozempic, although this can be a side effect? May want to d/c patient on home insulin doses and have her f/u with PCP prior to restart of Ozempic just in case.  Discussed at length with patient.  She states she has no other needs related to her DM at this time.   Thanks,  Adah Perl, RN, BC-ADM Inpatient Diabetes Coordinator Pager [phone] ([id number])

## 2021-02-11 NOTE — Progress Notes (Signed)
PROGRESS NOTE    Michaela Johnson  [id number] DOB: November 01, 1960 DOA: 02/10/2021 PCP: [name], NP  Brief Narrative: 60 year old female with history of COPD, tobacco abuse, uncontrolled diabetes mellitus, hypertension, paroxysmal atrial fibrillation on Xarelto, alcohol use presented to the ED with intractable nausea and vomiting, she reported starting Ozempic 2 days ago, at that time stopped her Lantus and NovoLog as she assumed this was being discontinued.  In addition also having cough congestion shortness of breath for 2 weeks, recently seen in urgent care and tested negative for COVID. -In the emergency room she was afebrile, blood glucose was 582 with an anion gap of 24, bicarb was 10, beta hydroxy butyrate acid was 8 -Chest x-ray noted reticulonodular opacities in the mid to lower lung reflecting interstitial edema versus pneumonia   Assessment & Plan:   Diabetic ketoacidosis -Anion gap has corrected will transition to Lantus and NovoLog sliding scale -I think patient misunderstood and discontinued her insulin 3 days ago when she was started on Ozempic -Increase Lantus dose, add NovoLog with meals  -Hemoglobin A1c is 10.3 -Diabetes coordinator consult -Transfer out of ICU today  Community-acquired pneumonia -History of productive cough congestion for 2 weeks -Continue Rocephin and azithromycin, COVID PCR is negative  Dysuria -UA on admission was unremarkable will repeat  A. fib with RVR -Heart rate improved, continue diltiazem, Xarelto  COPD/tobacco abuse -Counseled, add duo nebs  Alcohol use disorder -Reportedly consumes 2-4 drinks per day -No evidence of overt withdrawal, hold off on Ativan, continue thiamine, monitor on CIWA protocol  Essential hypertension -Continue diltiazem   DVT prophylaxis: Xarelto Code Status: Full code Family Communication: No family at bedside Disposition Plan:  Status is: Inpatient  Remains inpatient appropriate because:Inpatient  level of care appropriate due to severity of illness  Dispo: The patient is from: Home              Anticipated d/c is to: Home              Patient currently is not medically stable to d/c.   Difficult to place patient No  Consultants:    Procedures:   Antimicrobials:    Subjective: -Feels better today, reports some urinary symptoms, breathing better  Objective: Vitals:   02/11/21 0400 02/11/21 0500 02/11/21 0600 02/11/21 0752  BP: 137/75 (!) 170/91    Pulse: 92 93 90   Resp: '17 20 17   '$ Temp: 98.2 F (36.8 C)   98.9 F (37.2 C)  TempSrc: Oral   Oral  SpO2: 96% 95% 96%   Weight:      Height:        Intake/Output Summary (Last 24 hours) at 02/11/2021 1249 Last data filed at 02/11/2021 1230 Gross per 24 hour  Intake 1619.41 ml  Output 4650 ml  Net -3030.59 ml   Filed Weights   02/09/21 2051 02/10/21 0900  Weight: 71.2 kg 68.8 kg    Examination:  General exam: Chronically ill female appears older than stated age, AAOx3, nondistressed CVS: S1-S2, regular rate rhythm Lungs: Improved air movement bilaterally, few scattered basilar rhonchi Abdomen: Soft, nontender, bowel sounds present Extremities: No edema Skin: No rash on exposed skin  Psych: Appropriate mood and affect   Data Reviewed:   CBC: Recent Labs  Lab 02/09/21 2053 02/11/21 0521  WBC 12.8* 9.6  HGB 12.0 12.0  HCT 36.4 36.2  MCV 101.4* 96.3  PLT 329 [id number]   Basic Metabolic Panel: Recent Labs  Lab 02/09/21 2145 02/10/21 0308 02/10/21 0912 02/11/21  0521  NA 131* 135 135 137  K 5.3* 4.2 3.9 4.5  CL 97* 104 104 102  CO2 10* 13* 22 22  GLUCOSE 582* 416* 240* 360*  BUN 31* 32* 29* 15  CREATININE 1.17* 1.35* 1.08* 0.78  CALCIUM 9.1 8.7* 8.4* 8.7*  MG 2.3  --   --   --   PHOS 6.3*  --   --   --    GFR: Estimated Creatinine Clearance: 67.2 mL/min (by C-G formula based on SCr of 0.78 mg/dL). Liver Function Tests: Recent Labs  Lab 02/09/21 2145  AST 23  ALT 15  ALKPHOS 122  BILITOT  2.0*  PROT 7.8  ALBUMIN 3.8   Recent Labs  Lab 02/09/21 2145  LIPASE 20   No results for input(s): AMMONIA in the last 168 hours. Coagulation Profile: No results for input(s): INR, PROTIME in the last 168 hours. Cardiac Enzymes: No results for input(s): CKTOTAL, CKMB, CKMBINDEX, TROPONINI in the last 168 hours. BNP (last 3 results) No results for input(s): PROBNP in the last 8760 hours. HbA1C: Recent Labs    02/09/21 2145  HGBA1C 10.3*   CBG: Recent Labs  Lab 02/10/21 1408 02/10/21 1635 02/10/21 2059 02/11/21 0736 02/11/21 1129  GLUCAP 213* 150* 196* 314* 211*   Lipid Profile: No results for input(s): CHOL, HDL, LDLCALC, TRIG, CHOLHDL, LDLDIRECT in the last 72 hours. Thyroid Function Tests: No results for input(s): TSH, T4TOTAL, FREET4, T3FREE, THYROIDAB in the last 72 hours. Anemia Panel: No results for input(s): VITAMINB12, FOLATE, FERRITIN, TIBC, IRON, RETICCTPCT in the last 72 hours. Urine analysis:    Component Value Date/Time   COLORURINE STRAW (A) 02/09/2021 2144   APPEARANCEUR CLEAR (A) 02/09/2021 2144   LABSPEC 1.021 02/09/2021 2144   PHURINE 5.0 02/09/2021 2144   GLUCOSEU >=500 (A) 02/09/2021 2144   HGBUR SMALL (A) 02/09/2021 2144   BILIRUBINUR NEGATIVE 02/09/2021 2144   KETONESUR 80 (A) 02/09/2021 2144   PROTEINUR 30 (A) 02/09/2021 2144   NITRITE NEGATIVE 02/09/2021 2144   LEUKOCYTESUR NEGATIVE 02/09/2021 2144   Sepsis Labs: '@LABRCNTIP'$ (procalcitonin:4,lacticidven:4)  ) Recent Results (from the past 240 hour(s))  Resp Panel by RT-PCR (Flu A&B, Covid) Nasopharyngeal Swab     Status: None   Collection Time: 02/10/21  3:21 AM   Specimen: Nasopharyngeal Swab; Nasopharyngeal(NP) swabs in vial transport medium  Result Value Ref Range Status   SARS Coronavirus 2 by RT PCR NEGATIVE NEGATIVE Final    Comment: (NOTE) SARS-CoV-2 target nucleic acids are NOT DETECTED.  The SARS-CoV-2 RNA is generally detectable in upper respiratory specimens during the  acute phase of infection. The lowest concentration of SARS-CoV-2 viral copies this assay can detect is 138 copies/mL. A negative result does not preclude SARS-Cov-2 infection and should not be used as the sole basis for treatment or other patient management decisions. A negative result may occur with  improper specimen collection/handling, submission of specimen other than nasopharyngeal swab, presence of viral mutation(s) within the areas targeted by this assay, and inadequate number of viral copies(<138 copies/mL). A negative result must be combined with clinical observations, patient history, and epidemiological information. The expected result is Negative.  Fact Sheet for Patients:  EntrepreneurPulse.com.au  Fact Sheet for Healthcare Providers:  IncredibleEmployment.be  This test is no t yet approved or cleared by the [place] FDA and  has been authorized for detection and/or diagnosis of SARS-CoV-2 by FDA under an Emergency Use Authorization (EUA). This EUA will remain  in effect (meaning this test can be  used) for the duration of the COVID-19 declaration under Section 564(b)(1) of the Act, 21 U.S.C.section 360bbb-3(b)(1), unless the authorization is terminated  or revoked sooner.       Influenza A by PCR NEGATIVE NEGATIVE Final   Influenza B by PCR NEGATIVE NEGATIVE Final    Comment: (NOTE) The Xpert Xpress SARS-CoV-2/FLU/RSV plus assay is intended as an aid in the diagnosis of influenza from Nasopharyngeal swab specimens and should not be used as a sole basis for treatment. Nasal washings and aspirates are unacceptable for Xpert Xpress SARS-CoV-2/FLU/RSV testing.  Fact Sheet for Patients: EntrepreneurPulse.com.au  Fact Sheet for Healthcare Providers: IncredibleEmployment.be  This test is not yet approved or cleared by the [place] FDA and has been authorized for detection and/or  diagnosis of SARS-CoV-2 by FDA under an Emergency Use Authorization (EUA). This EUA will remain in effect (meaning this test can be used) for the duration of the COVID-19 declaration under Section 564(b)(1) of the Act, 21 U.S.C. section 360bbb-3(b)(1), unless the authorization is terminated or revoked.  Performed at [facility], [address] [place], [place] [address]   MRSA Next Gen by PCR, Nasal     Status: None   Collection Time: 02/10/21  9:22 AM   Specimen: Nasal Mucosa; Nasal Swab  Result Value Ref Range Status   MRSA by PCR Next Gen NOT DETECTED NOT DETECTED Final    Comment: (NOTE) The GeneXpert MRSA Assay (FDA approved for NASAL specimens only), is one component of a comprehensive MRSA colonization surveillance program. It is not intended to diagnose MRSA infection nor to guide or monitor treatment for MRSA infections. Test performance is not FDA approved in patients less than 5 years old. Performed at [facility], [address] [place], [place] [address]          Radiology Studies: DG Chest [place] 1 View  Result Date: 02/10/2021 CLINICAL DATA:  DKA, emesis EXAM: PORTABLE CHEST 1 VIEW COMPARISON:  Radiograph 11/24/2019 FINDINGS: Increasing hazy reticular opacities are present in the mid to lower lungs with some mild fissural thickening and pulmonary vascular congestion. Could reflect developing edema though underlying infection or pneumonitis is not fully excluded. No pneumothorax. No visible effusion. Calcified aorta. Remaining cardiomediastinal contours are unremarkable for the portable technique. No acute or worrisome abnormality of the chest wall. Telemetry leads overlie the chest. IMPRESSION: Increasing hazy reticular opacities the mid to lower lungs, could reflect developing interstitial edema and atelectasis though underlying infection or pneumonitis is difficult to fully exclude. Electronically Signed   By: Lovena Le M.D.   On: 02/10/2021  01:16        Scheduled Meds:  azithromycin  500 mg Oral Daily   Chlorhexidine Gluconate Cloth  6 each Topical Daily   folic acid  1 mg Oral Daily   insulin aspart  0-15 Units Subcutaneous TID WC   insulin aspart  0-5 Units Subcutaneous QHS   insulin aspart  5 Units Subcutaneous TID WC   insulin glargine  30 Units Subcutaneous Daily   ipratropium-albuterol  3 mL Nebulization TID   multivitamin with minerals  1 tablet Oral Daily   nicotine  21 mg Transdermal Daily   rivaroxaban  20 mg Oral Q supper   thiamine  100 mg Oral Daily   Or   thiamine  100 mg Intravenous Daily   Continuous Infusions:  cefTRIAXone (ROCEPHIN)  IV Stopped (02/11/21 0256)     LOS: 1 day    Time spent: 18mn  PDomenic Polite  MD [facility]   02/11/2021, 12:49 PM

## 2021-02-11 NOTE — Progress Notes (Signed)
Pt scored a 7 on the RT protocol assessment. The nebulizer treatments are to change to TID.

## 2021-02-12 DIAGNOSIS — E1010 Type 1 diabetes mellitus with ketoacidosis without coma: Principal | ICD-10-CM

## 2021-02-12 LAB — GLUCOSE, CAPILLARY
Glucose-Capillary: 268 mg/dL — ABNORMAL HIGH (ref 70–99)
Glucose-Capillary: 313 mg/dL — ABNORMAL HIGH (ref 70–99)

## 2021-02-12 MED ORDER — CEFDINIR 300 MG PO CAPS: 300.0000 mg | ORAL_CAPSULE | Freq: Two times a day (BID) | ORAL | 0 refills | Status: AC

## 2021-02-12 NOTE — Progress Notes (Signed)
Chaplain assisted patient in completing her wishes on [employer]. Daughter and sister info will be added later.

## 2021-02-12 NOTE — Discharge Summary (Signed)
Physician Discharge Summary  Michaela Johnson [id number] DOB: Nov 17, 1960 DOA: 02/10/2021  PCP: [name], NP  Admit date: 02/10/2021 Discharge date: 02/12/2021  Time spent: 35mnutes  Recommendations for Outpatient Follow-up:  PCP in 1 week, patient advised to resume insulin and Ozempic, review CBGs, compliance Follow-up chest x-ray in 6 weeks  Discharge Diagnoses:  Principal Problem:   DKA (diabetic ketoacidosis) ([facility] Active Problems:   Atrial fibrillation with rapid ventricular response (HCC)   Chronic anticoagulation   Nicotine dependence   Essential hypertension   Community acquired pneumonia   Alcohol use disorder, moderate, dependence ([facility]   Discharge Condition: stable  Diet recommendation: Diabetic  Filed Weights   02/09/21 2051 02/10/21 0900  Weight: 71.2 kg 68.8 kg    History of present illness:  60year old female with history of COPD, tobacco abuse, uncontrolled diabetes mellitus, hypertension, paroxysmal atrial fibrillation on Xarelto, alcohol use presented to the ED with intractable nausea and vomiting, she reported starting Ozempic 2 days ago, at that time stopped her Lantus and NovoLog as she assumed this was being discontinued.  In addition also having cough congestion shortness of breath for 2 weeks, recently seen in urgent care and tested negative for COVID. -In the emergency room she was afebrile, blood glucose was 582 with an anion gap of 24, bicarb was 10, beta hydroxy butyrate acid was 8 -Chest x-ray noted reticulonodular opacities in the mid to lower lung reflecting interstitial edema versus pneumonia    Hospital Course:   Diabetic ketoacidosis -I think patient misunderstood and discontinued her insulin 3 days ago when she was started on Ozempic, instead of continuing both, admitted with nausea vomiting, blood glucose of 582 with anion gap of 24 -Treated with insulin drip and IV fluids, clinically improved, transition to Lantus and  NovoLog -Hemoglobin A1c is 10.3 -Diabetes coordinator consult appreciated -Discharged home on Lantus, NovoLog and Ozempic, advised to follow-up with PCP in 1 week   Community-acquired pneumonia -History of productive cough congestion for 2 weeks -Treated with IV ceftriaxone and azithromycin, COVID PCR was negative, clinically improving, switch to oral cefdinir, continue this for 4 more days   Dysuria -Urinalysis was unremarkable   A. fib with RVR -Heart rate improved, continue diltiazem, Xarelto   COPD/tobacco abuse -Counseled, add duo nebs   Alcohol use disorder -Reportedly consumes 2-4 drinks per day -No evidence of overt withdrawal,  -Counseled   Essential hypertension -Continue diltiazem    Discharge Exam: Vitals:   02/12/21 0800 02/12/21 0900  BP: (!) 158/87 (!) 151/84  Pulse: 92 92  Resp: 20 12  Temp: 99.1 F (37.3 C)   SpO2: 98% 97%    General: AAOx3 Cardiovascular: S1-S2, regular rate rhythm Respiratory: Clear  Discharge Instructions   Discharge Instructions     Diet Carb Modified   Complete by: As directed    Increase activity slowly   Complete by: As directed       Allergies as of 02/12/2021       Reactions   Codeine Itching   Levaquin [levofloxacin In D5w] Itching        Medication List     TAKE these medications    acetaminophen 325 MG tablet Commonly known as: TYLENOL TAKE 1-2 TABLETS (325-650 MG TOTAL) BY MOUTH EVERY 6 (SIX) HOURS AS NEEDED FOR MILD PAIN (PAIN SCORE 1-3 OR TEMP > 100.5).   albuterol 108 (90 Base) MCG/ACT inhaler Commonly known as: VENTOLIN HFA Inhale 2 puffs into the lungs every 6 (six) hours as needed for wheezing  or shortness of breath.   amLODipine-benazepril 5-40 MG capsule Commonly known as: LOTREL Take 1 capsule by mouth daily.   cefdinir 300 MG capsule Commonly known as: OMNICEF Take 1 capsule (300 mg total) by mouth 2 (two) times daily for 3 days.   CertaVite/Antioxidants Tabs TAKE 1 TABLET BY  MOUTH DAILY   diltiazem 240 MG 24 hr capsule Commonly known as: CARDIZEM CD Take 1 capsule (240 mg total) by mouth daily.   diphenhydrAMINE 12.5 MG/5ML liquid Commonly known as: BENADRYL Take 25 mg by mouth 4 (four) times daily as needed for itching.   FLUoxetine 40 MG capsule Commonly known as: PROZAC Take 40 mg by mouth daily.   gabapentin 300 MG capsule Commonly known as: NEURONTIN Take 600 mg by mouth at bedtime as needed (Neuropathy).   insulin aspart 100 UNIT/ML injection Commonly known as: novoLOG Inject 4 Units into the skin 3 (three) times daily with meals.   insulin glargine 100 UNIT/ML injection Commonly known as: LANTUS Inject 45 Units into the skin in the morning.   omeprazole 20 MG capsule Commonly known as: PRILOSEC Take 20 mg by mouth daily.   Ozempic (0.25 or 0.5 MG/DOSE) 2 MG/1.5ML Sopn Generic drug: Semaglutide(0.25 or 0.'5MG'$ /DOS) Inject 0.25 mg into the skin every 7 (seven) days.   vitamin C 500 MG tablet Commonly known as: ASCORBIC ACID TAKE 1 TABLET (500 MG TOTAL) BY MOUTH DAILY.   Vitamin D 125 MCG (5000 UT) Caps Take 1 capsule by mouth daily. What changed: Another medication with the same name was removed. Continue taking this medication, and follow the directions you see here.       ASK your doctor about these medications    rivaroxaban 20 MG Tabs tablet Commonly known as: XARELTO Take 1 tablet (20 mg total) by mouth daily with supper.       Allergies  Allergen Reactions   Codeine Itching   Levaquin [Levofloxacin In D5w] Itching    Follow-up Information     [name], NP. Schedule an appointment as soon as possible for a visit in 1 week(s).   Specialty: Nurse Practitioner Contact information: [place] [address] [phone]                  The results of significant diagnostics from this hospitalization (including imaging, microbiology, ancillary and laboratory) are listed below for  reference.    Significant Diagnostic Studies: DG Chest Port 1 View  Result Date: 02/10/2021 CLINICAL DATA:  DKA, emesis EXAM: PORTABLE CHEST 1 VIEW COMPARISON:  Radiograph 11/24/2019 FINDINGS: Increasing hazy reticular opacities are present in the mid to lower lungs with some mild fissural thickening and pulmonary vascular congestion. Could reflect developing edema though underlying infection or pneumonitis is not fully excluded. No pneumothorax. No visible effusion. Calcified aorta. Remaining cardiomediastinal contours are unremarkable for the portable technique. No acute or worrisome abnormality of the chest wall. Telemetry leads overlie the chest. IMPRESSION: Increasing hazy reticular opacities the mid to lower lungs, could reflect developing interstitial edema and atelectasis though underlying infection or pneumonitis is difficult to fully exclude. Electronically Signed   By: Lovena Le M.D.   On: 02/10/2021 01:16    Microbiology: Recent Results (from the past 240 hour(s))  Resp Panel by RT-PCR (Flu A&B, Covid) Nasopharyngeal Swab     Status: None   Collection Time: 02/10/21  3:21 AM   Specimen: Nasopharyngeal Swab; Nasopharyngeal(NP) swabs in vial transport medium  Result Value Ref Range Status  SARS Coronavirus 2 by RT PCR NEGATIVE NEGATIVE Final    Comment: (NOTE) SARS-CoV-2 target nucleic acids are NOT DETECTED.  The SARS-CoV-2 RNA is generally detectable in upper respiratory specimens during the acute phase of infection. The lowest concentration of SARS-CoV-2 viral copies this assay can detect is 138 copies/mL. A negative result does not preclude SARS-Cov-2 infection and should not be used as the sole basis for treatment or other patient management decisions. A negative result may occur with  improper specimen collection/handling, submission of specimen other than nasopharyngeal swab, presence of viral mutation(s) within the areas targeted by this assay, and inadequate number of  viral copies(<138 copies/mL). A negative result must be combined with clinical observations, patient history, and epidemiological information. The expected result is Negative.  Fact Sheet for Patients:  EntrepreneurPulse.com.au  Fact Sheet for Healthcare Providers:  IncredibleEmployment.be  This test is no t yet approved or cleared by the [place] FDA and  has been authorized for detection and/or diagnosis of SARS-CoV-2 by FDA under an Emergency Use Authorization (EUA). This EUA will remain  in effect (meaning this test can be used) for the duration of the COVID-19 declaration under Section 564(b)(1) of the Act, 21 U.S.C.section 360bbb-3(b)(1), unless the authorization is terminated  or revoked sooner.       Influenza A by PCR NEGATIVE NEGATIVE Final   Influenza B by PCR NEGATIVE NEGATIVE Final    Comment: (NOTE) The Xpert Xpress SARS-CoV-2/FLU/RSV plus assay is intended as an aid in the diagnosis of influenza from Nasopharyngeal swab specimens and should not be used as a sole basis for treatment. Nasal washings and aspirates are unacceptable for Xpert Xpress SARS-CoV-2/FLU/RSV testing.  Fact Sheet for Patients: EntrepreneurPulse.com.au  Fact Sheet for Healthcare Providers: IncredibleEmployment.be  This test is not yet approved or cleared by the [place] FDA and has been authorized for detection and/or diagnosis of SARS-CoV-2 by FDA under an Emergency Use Authorization (EUA). This EUA will remain in effect (meaning this test can be used) for the duration of the COVID-19 declaration under Section 564(b)(1) of the Act, 21 U.S.C. section 360bbb-3(b)(1), unless the authorization is terminated or revoked.  Performed at [facility], [address] [place], [place] [address]   MRSA Next Gen by PCR, Nasal     Status: None   Collection Time: 02/10/21  9:22 AM   Specimen: Nasal Mucosa;  Nasal Swab  Result Value Ref Range Status   MRSA by PCR Next Gen NOT DETECTED NOT DETECTED Final    Comment: (NOTE) The GeneXpert MRSA Assay (FDA approved for NASAL specimens only), is one component of a comprehensive MRSA colonization surveillance program. It is not intended to diagnose MRSA infection nor to guide or monitor treatment for MRSA infections. Test performance is not FDA approved in patients less than 60 years old. Performed at [facility], [address] [place], [place] [address]      Labs: Basic Metabolic Panel: Recent Labs  Lab 02/09/21 2145 02/10/21 0308 02/10/21 0912 02/11/21 0521  NA 131* 135 135 137  K 5.3* 4.2 3.9 4.5  CL 97* 104 104 102  CO2 10* 13* 22 22  GLUCOSE 582* 416* 240* 360*  BUN 31* 32* 29* 15  CREATININE 1.17* 1.35* 1.08* 0.78  CALCIUM 9.1 8.7* 8.4* 8.7*  MG 2.3  --   --   --   PHOS 6.3*  --   --   --    Liver Function Tests: Recent Labs  Lab 02/09/21 2145  AST 23  ALT 15  ALKPHOS 122  BILITOT 2.0*  PROT 7.8  ALBUMIN 3.8   Recent Labs  Lab 02/09/21 2145  LIPASE 20   No results for input(s): AMMONIA in the last 168 hours. CBC: Recent Labs  Lab 02/09/21 2053 02/11/21 0521  WBC 12.8* 9.6  HGB 12.0 12.0  HCT 36.4 36.2  MCV 101.4* 96.3  PLT 329 264   Cardiac Enzymes: No results for input(s): CKTOTAL, CKMB, CKMBINDEX, TROPONINI in the last 168 hours. BNP: BNP (last 3 results) No results for input(s): BNP in the last 8760 hours.  ProBNP (last 3 results) No results for input(s): PROBNP in the last 8760 hours.  CBG: Recent Labs  Lab 02/11/21 1129 02/11/21 1634 02/11/21 1916 02/11/21 2236 02/12/21 0744  GLUCAP 211* 195* 157* 96 268*       Signed:  Domenic Polite MD.  [facility] 02/12/2021, 11:05 AM

## 2021-02-12 NOTE — Progress Notes (Signed)
Patient ambulated 246f on room air.  O2 sat remained at or above 90% when ambulating. O2 sats briefly dropped to 88% once the patient sat back down to catch her breath, but recovered to 94% with coaching and intentional breaths. Dr. JBroadus [place]aware.

## 2021-04-02 ENCOUNTER — Other Ambulatory Visit: Payer: Self-pay | Admitting: Physician Assistant

## 2021-04-07 NOTE — Progress Notes (Deleted)
Virtual Visit via Video Note   This visit type was conducted due to national recommendations for restrictions regarding the COVID-19 Pandemic (e.g. social distancing) in an effort to limit this patient's exposure and mitigate transmission in our community.  Due to her co-morbid illnesses, this patient is at least at moderate risk for complications without adequate follow up.  This format is felt to be most appropriate for this patient at this time.  All issues noted in this document were discussed and addressed.  A limited physical exam was performed with this format.  Please refer to the patient's chart for her consent to telehealth for [facility].   I connected with  Michaela Johnson on 04/07/21 by a video enabled telemedicine application and verified that I am speaking with the correct person using two identifiers. I discussed the limitations of evaluation and management by telemedicine. The patient expressed understanding and agreed to proceed.   Evaluation Performed:  Follow-up visit  Date:  04/07/2021   ID:  Michaela Johnson, DOB Oct 30, 1960, MRN [id number]  Patient Location:  [id number] [place] [address]   Provider location:   [facility], [place] office  PCP:  [name], NP  Cardiologist:  Patsy Baltimore   Chief Complaint:  Atrial fib, paroxysmal  History of Present Illness:    Michaela Johnson is a 60 y.o. female who presents via audio/video conferencing for a telehealth visit today.   The patient does not symptoms concerning for COVID-19 infection (fever, chills, cough, or new SHORTNESS OF BREATH).   Patient has a past medical history of paroxysmal atrial fibrillation, August 2019 insulin-dependent diabetes,  prior history of DKA,  sunstance abuse including cocaine, alcohol abuse abuse neuropathy,  Admission to Admission to [facility] 01/2018 with DKA, nausea vomiting potassium 7.1 sodium level 123 Syncope in the setting of vasovagal  episodes Who presents to follow-up with her atrial fibrillation, syncope/vasovagal episodes  01/2019 seen in the ER  diarrhea, bleeding, weak Syncope sitting on the commode Felling bathroom, wrist injury On eliquis  Sees GI soon, given recent GI bleeding Diarrhea episodes  Dizzy yesterday, etiology unclear No recent fluttering  Sugars in 100s to 200s  Feels well  Other past medical hx reviewed 7/12 to 7./14 emesis, hyperkalemia,DKA, atrial fibrillation 7/17 to 7/28  Weakness emesis, hyperkalemia,DKA 8/4 to 8/5, PAF, emisis, hyperkalemia,DKA, treated with amiodaronetreated with amiodarone, eliquis   In follow-up today she reports having diarrhea one week ago, She thinks it was from drinking a Glucerna Felt dizzy while having the diarrhea in the bathroom, developed syncope Did not go to the hospital Hit the side of her face on the left   hemoglobin A1c 7.5   Prior CV studies:   The following studies were reviewed today:   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 07/30/2020   Constipation    Depression    Diabetes mellitus without complication (HCC)    Dysrhythmia     PAF   GERD (gastroesophageal reflux disease)    Hypertension    Neuropathy    Nicotine dependence 07/31/2020   PAF (paroxysmal atrial fibrillation) (HCC)    Panic attacks    Pneumonia    Tobacco use    Vitamin D deficiency    Past  Surgical History:  Procedure Laterality Date   ANKLE FUSION Right 07/30/2020   Procedure: ANKLE FUSION;  Surgeon: Altamese Progreso, MD;  Location: [facility];  Service: Orthopedics;  Laterality: Right;   APPLICATION OF WOUND VAAPPLICATION OF WOUND VAC (Right Ankle)C (Right Ankle)  [id number]   APPLICATION OF WOUND VAC Right 07/30/2020   Procedure: APPLICATION OF WOUND VAC;  Surgeon: Altamese Moville,  MD;  Location: [place];  Service: Orthopedics;  Laterality: Right;   HARDWARE REMOVAL Right 07/30/2020   Procedure: HARDWARE REMOVAL RIGHT ANKLE;  Surgeon: Altamese [place], MD;  Location: [facility];  Service: Orthopedics;  Laterality: Right;   HARDWARE REMOVAL RIGHT ANKLE (Right Ankle  07/31/2020   ORIF ANKLE FRACTURE Right 07/04/2020   Procedure: OPEN REDUCTION INTERNAL FIXATION (ORIF) ANKLE FRACTURE;  Surgeon: Lovell Sheehan, MD;  Location: [facility];  Service: Orthopedics;  Laterality: Right;     Allergies:   Codeine and Levaquin [levofloxacin in d5w]   Social History   Tobacco Use   Smoking status: Some Days    Packs/day: 1.00    Years: 41.00    Pack years: 41.00    Types: Cigarettes   Smokeless tobacco: Never  Vaping Use   Vaping Use: Never used  Substance Use Topics   Alcohol use: Yes    Alcohol/week: 6.0 standard drinks    Types: 6 Standard drinks or equivalent per week   Drug use: Yes    Frequency: 7.0 times per week    Types: Marijuana     Current Outpatient Medications on File Prior to Visit  Medication Sig Dispense Refill   acetaminophen (TYLENOL) 325 MG tablet TAKE 1-2 TABLETS (325-650 MG TOTAL) BY MOUTH EVERY 6 (SIX) HOURS AS NEEDED FOR MILD PAIN (PAIN SCORE 1-3 OR TEMP > 100.5). 60 tablet 0   albuterol (PROVENTIL HFA;VENTOLIN HFA) 108 (90 Base) MCG/ACT inhaler Inhale 2 puffs into the lungs every 6 (six) hours as needed for wheezing or shortness of breath. 1 Inhaler 2   amLODipine-benazepril (LOTREL) 5-40 MG capsule Take 1 capsule by mouth daily.     Cholecalciferol (VITAMIN D) 125 MCG (5000 UT) CAPS Take 1 capsule by mouth daily. 30 capsule 5   diltiazem (CARDIZEM CD) 240 MG 24 hr capsule Take 1 capsule by mouth once daily 90 capsule 0   diphenhydrAMINE (BENADRYL) 12.5 MG/5ML liquid Take 25 mg by mouth 4 (four) times daily as needed for itching.     FLUoxetine (PROZAC) 40 MG capsule Take 40 mg by mouth daily.     gabapentin (NEURONTIN) 300 MG capsule Take 600 mg by mouth  at bedtime as needed (Neuropathy).     insulin aspart (NOVOLOG) 100 UNIT/ML injection Inject 4 Units into the skin 3 (three) times daily with meals.     insulin glargine (LANTUS) 100 UNIT/ML injection Inject 45 Units into the skin in the morning.     Multiple Vitamins-Minerals (CERTAVITE/ANTIOXIDANTS) TABS TAKE 1 TABLET BY MOUTH DAILY 30 tablet 3   omeprazole (PRILOSEC) 20 MG capsule Take 20 mg by mouth daily.     OZEMPIC, 0.25 OR 0.5 MG/DOSE, 2 MG/1.5ML SOPN Inject 0.25 mg into the skin every 7 (seven) days.     rivaroxaban (XARELTO) 20 MG TABS tablet Take 1 tablet (20 mg total) by mouth daily with supper. (Patient taking differently: Take 20 mg by mouth daily.) 30 tablet 11   vitamin C (ASCORBIC ACID) 500 MG tablet TAKE 1 TABLET (500 MG TOTAL) BY MOUTH DAILY. 30 tablet 2   No current facility-administered  medications on file prior to visit.     Family Hx: The patient's family history includes CAD in her father; Diabetes in her mother; Heart Problems in her father; Heart failure in her father.  ROS:   Please see the history of present illness.    Review of Systems  Constitutional: Negative.   HENT: Negative.    Respiratory: Negative.    Cardiovascular: Negative.   Gastrointestinal: Negative.   Musculoskeletal: Negative.   Neurological:  Positive for loss of consciousness.  Psychiatric/Behavioral: Negative.    All other systems reviewed and are negative.   Labs/Other Tests and Data Reviewed:    Recent Labs: 02/09/2021: ALT 15; Magnesium 2.3 02/11/2021: BUN 15; Creatinine, Ser 0.78; Hemoglobin 12.0; Platelets 264; Potassium 4.5; Sodium 137   Recent Lipid Panel Lab Results  Component Value Date/Time   CHOL 136 01/30/2018 05:31 AM   TRIG 106 01/30/2018 05:31 AM   HDL 73 01/30/2018 05:31 AM   CHOLHDL 1.9 01/30/2018 05:31 AM   LDLCALC 42 01/30/2018 05:31 AM    Wt Readings from Last 3 Encounters:  02/10/21 151 lb 10.8 oz (68.8 kg)  07/30/20 150 lb (68 kg)  07/04/20 158 lb 15.2  oz (72.1 kg)     Exam:    Vital Signs: Vital signs may also be detailed in the HPI There were no vitals taken for this visit.  Wt Readings from Last 3 Encounters:  02/10/21 151 lb 10.8 oz (68.8 kg)  07/30/20 150 lb (68 kg)  07/04/20 158 lb 15.2 oz (72.1 kg)   Temp Readings from Last 3 Encounters:  02/12/21 99 F (37.2 C) (Oral)  08/03/20 98.7 F (37.1 C) (Oral)  07/04/20 97.9 F (36.6 C) (Temporal)   BP Readings from Last 3 Encounters:  02/12/21 (!) 141/68  08/03/20 (!) 153/78  07/04/20 129/62   Pulse Readings from Last 3 Encounters:  02/12/21 77  08/03/20 80  07/04/20 88     Well nourished, well developed female in no acute distress. Constitutional:  oriented to person, place, and time. No distress.  Head: Normocephalic and atraumatic.  Eyes:  no discharge. No scleral icterus.  Neck: Normal range of motion. Neck supple.  Pulmonary/Chest: No audible wheezing, no distress, appears comfortable Musculoskeletal: Normal range of motion.  no  tenderness or deformity.  Neurological:   Coordination normal. Full exam not performed Skin:  No rash Psychiatric:  normal mood and affect. behavior is normal. Thought content normal.    ASSESSMENT & PLAN:    Problem List Items Addressed This Visit   None PAF (paroxysmal atrial fibrillation) ([place]) - Plan: EKG 12-Lead continue on anticoagulation Elevated CHDS VASC : female, diabetic, vascular disease, hypertension Continue diltiazem extended release 180 mg daily  on xarelto.  Denies any recent episodes   Diabetic ketoacidosis without coma associated with other specified diabetes mellitus ([place]) - Numerous hospital admissions for DKA through July and August 2019   Substance abuse [facility]) Recommended cessation from any alcohol, cocaine Again discussed with her in detail  Insulin dependent diabetes mellitus ([place]) Managed by primary care Sugars continue to run high typically in the 200 range Diet discussed with her, recommended  changes  Syncope Prior episodes, one recent episode seems to happen in the setting of diarrhea, vasovagal response Recommended hydration She is scheduled to see GI Recent episode of bloody bowel move x3 episodes recently, hematocrit stable Suggested she monitor blood pressure at home and if it runs low call her office we may need to decrease the dose of  the diltiazem  GI bleed Recommend she stop Xarelto for any recurrent GI bleed call our office  COVID-19 Education: The signs and symptoms of COVID-19 were discussed with the patient and how to seek care for testing (follow up with PCP or arrange E-visit).  The importance of social distancing was discussed today.  Patient Risk:   After full review of this patients clinical status, I feel that they are at least moderate risk at this time.  Time:   Today, I have spent 25 minutes with the patient with telehealth technology discussing the cardiac and medical problems/diagnoses detailed above   Additional 10 min spent reviewing the chart prior to patient visit today   Medication Adjustments/Labs and Tests Ordered: Current medicines are reviewed at length with the patient today.  Concerns regarding medicines are outlined above.   Tests Ordered: No tests ordered   Medication Changes: No changes made   Disposition: Follow-up in 12 months   Signed, Ida Rogue, MD  [place] [facility] [address] [place] [address], [place], [place] [address]

## 2021-04-08 ENCOUNTER — Ambulatory Visit: Payer: Self-pay | Admitting: Cardiovascular Disease

## 2021-04-08 DIAGNOSIS — I480 Paroxysmal atrial fibrillation: Secondary | ICD-10-CM

## 2021-04-08 DIAGNOSIS — R55 Syncope and collapse: Secondary | ICD-10-CM

## 2021-04-08 DIAGNOSIS — I10 Essential (primary) hypertension: Secondary | ICD-10-CM

## 2021-04-08 DIAGNOSIS — Z720 Tobacco use: Secondary | ICD-10-CM

## 2021-04-09 ENCOUNTER — Encounter: Payer: Self-pay | Admitting: Cardiovascular Disease

## 2021-04-18 ENCOUNTER — Other Ambulatory Visit: Payer: Self-pay

## 2021-04-18 ENCOUNTER — Emergency Department: Payer: Self-pay

## 2021-04-18 ENCOUNTER — Inpatient Hospital Stay
Admission: EM | Admit: 2021-04-18 | Discharge: 2021-04-20 | DRG: 638 | Disposition: A | Discharge status: Home / Self Care | Payer: Self-pay | Attending: Hospitalist | Admitting: Hospitalist

## 2021-04-18 DIAGNOSIS — Z833 Family history of diabetes mellitus: Secondary | ICD-10-CM

## 2021-04-18 DIAGNOSIS — E860 Dehydration: Secondary | ICD-10-CM

## 2021-04-18 DIAGNOSIS — I700 Atherosclerosis of aorta: Secondary | ICD-10-CM

## 2021-04-18 DIAGNOSIS — R112 Nausea with vomiting, unspecified: Secondary | ICD-10-CM

## 2021-04-18 DIAGNOSIS — R739 Hyperglycemia, unspecified: Secondary | ICD-10-CM

## 2021-04-18 DIAGNOSIS — Z79899 Other long term (current) drug therapy: Secondary | ICD-10-CM

## 2021-04-18 DIAGNOSIS — Z7984 Long term (current) use of oral hypoglycemic drugs: Secondary | ICD-10-CM

## 2021-04-18 DIAGNOSIS — R079 Chest pain, unspecified: Secondary | ICD-10-CM

## 2021-04-18 DIAGNOSIS — R918 Other nonspecific abnormal finding of lung field: Secondary | ICD-10-CM

## 2021-04-18 DIAGNOSIS — I152 Hypertension secondary to endocrine disorders: Secondary | ICD-10-CM | POA: Diagnosis present

## 2021-04-18 DIAGNOSIS — E875 Hyperkalemia: Secondary | ICD-10-CM | POA: Diagnosis present

## 2021-04-18 DIAGNOSIS — I2510 Atherosclerotic heart disease of native coronary artery without angina pectoris: Secondary | ICD-10-CM | POA: Diagnosis present

## 2021-04-18 DIAGNOSIS — F32A Depression, unspecified: Secondary | ICD-10-CM | POA: Diagnosis present

## 2021-04-18 DIAGNOSIS — Z888 Allergy status to other drugs, medicaments and biological substances status: Secondary | ICD-10-CM

## 2021-04-18 DIAGNOSIS — F17210 Nicotine dependence, cigarettes, uncomplicated: Secondary | ICD-10-CM | POA: Diagnosis present

## 2021-04-18 DIAGNOSIS — Z981 Arthrodesis status: Secondary | ICD-10-CM

## 2021-04-18 DIAGNOSIS — E1169 Type 2 diabetes mellitus with other specified complication: Secondary | ICD-10-CM | POA: Diagnosis present

## 2021-04-18 DIAGNOSIS — I447 Left bundle-branch block, unspecified: Secondary | ICD-10-CM | POA: Diagnosis present

## 2021-04-18 DIAGNOSIS — Z885 Allergy status to narcotic agent status: Secondary | ICD-10-CM

## 2021-04-18 DIAGNOSIS — E1110 Type 2 diabetes mellitus with ketoacidosis without coma: Principal | ICD-10-CM

## 2021-04-18 DIAGNOSIS — K760 Fatty (change of) liver, not elsewhere classified: Secondary | ICD-10-CM | POA: Diagnosis present

## 2021-04-18 DIAGNOSIS — Z7901 Long term (current) use of anticoagulants: Secondary | ICD-10-CM

## 2021-04-18 DIAGNOSIS — E559 Vitamin D deficiency, unspecified: Secondary | ICD-10-CM | POA: Diagnosis present

## 2021-04-18 DIAGNOSIS — E1159 Type 2 diabetes mellitus with other circulatory complications: Secondary | ICD-10-CM | POA: Diagnosis present

## 2021-04-18 DIAGNOSIS — K219 Gastro-esophageal reflux disease without esophagitis: Secondary | ICD-10-CM | POA: Diagnosis present

## 2021-04-18 DIAGNOSIS — I25119 Atherosclerotic heart disease of native coronary artery with unspecified angina pectoris: Secondary | ICD-10-CM

## 2021-04-18 DIAGNOSIS — R197 Diarrhea, unspecified: Secondary | ICD-10-CM

## 2021-04-18 DIAGNOSIS — F1010 Alcohol abuse, uncomplicated: Secondary | ICD-10-CM

## 2021-04-18 DIAGNOSIS — F1020 Alcohol dependence, uncomplicated: Secondary | ICD-10-CM | POA: Diagnosis present

## 2021-04-18 DIAGNOSIS — F410 Panic disorder [episodic paroxysmal anxiety] without agoraphobia: Secondary | ICD-10-CM | POA: Diagnosis present

## 2021-04-18 DIAGNOSIS — I480 Paroxysmal atrial fibrillation: Secondary | ICD-10-CM | POA: Diagnosis present

## 2021-04-18 DIAGNOSIS — R519 Headache, unspecified: Secondary | ICD-10-CM

## 2021-04-18 DIAGNOSIS — Z794 Long term (current) use of insulin: Secondary | ICD-10-CM

## 2021-04-18 DIAGNOSIS — Z66 Do not resuscitate: Secondary | ICD-10-CM | POA: Diagnosis present

## 2021-04-18 DIAGNOSIS — D3502 Benign neoplasm of left adrenal gland: Secondary | ICD-10-CM

## 2021-04-18 DIAGNOSIS — Z8249 Family history of ischemic heart disease and other diseases of the circulatory system: Secondary | ICD-10-CM

## 2021-04-18 DIAGNOSIS — J439 Emphysema, unspecified: Secondary | ICD-10-CM

## 2021-04-18 DIAGNOSIS — Z23 Encounter for immunization: Secondary | ICD-10-CM

## 2021-04-18 DIAGNOSIS — N179 Acute kidney failure, unspecified: Secondary | ICD-10-CM | POA: Diagnosis present

## 2021-04-18 DIAGNOSIS — Z20822 Contact with and (suspected) exposure to covid-19: Secondary | ICD-10-CM | POA: Diagnosis present

## 2021-04-18 DIAGNOSIS — D3501 Benign neoplasm of right adrenal gland: Secondary | ICD-10-CM | POA: Diagnosis present

## 2021-04-18 LAB — COMPREHENSIVE METABOLIC PANEL
ALT: 20 U/L (ref 0–44)
AST: 21 U/L (ref 15–41)
Albumin: 3.9 g/dL (ref 3.5–5.0)
Alkaline Phosphatase: 77 U/L (ref 38–126)
Anion gap: 28 — ABNORMAL HIGH (ref 5–15)
BUN: 36 mg/dL — ABNORMAL HIGH (ref 8–23)
CO2: 10 mmol/L — ABNORMAL LOW (ref 22–32)
Calcium: 9.6 mg/dL (ref 8.9–10.3)
Chloride: 94 mmol/L — ABNORMAL LOW (ref 98–111)
Creatinine, Ser: 1.28 mg/dL — ABNORMAL HIGH (ref 0.44–1.00)
GFR, Estimated: 48 mL/min — ABNORMAL LOW (ref >60)
Glucose, Bld: 554 mg/dL (ref 70–99)
Potassium: 5.5 mmol/L — ABNORMAL HIGH (ref 3.5–5.1)
Sodium: 132 mmol/L — ABNORMAL LOW (ref 135–145)
Total Bilirubin: 1.9 mg/dL — ABNORMAL HIGH (ref 0.3–1.2)
Total Protein: 8 g/dL (ref 6.5–8.1)

## 2021-04-18 LAB — BETA-HYDROXYBUTYRIC ACID: Beta-Hydroxybutyric Acid: >8 mmol/L — ABNORMAL HIGH (ref 0.05–0.27)

## 2021-04-18 LAB — BLOOD GAS, VENOUS
Acid-base deficit: 18.8 mmol/L — ABNORMAL HIGH (ref 0.0–2.0)
Bicarbonate: 7.5 mmol/L — ABNORMAL LOW (ref 20.0–28.0)
O2 Saturation: 95.1 %
Patient temperature: 37
pCO2, Ven: 20 mmHg — ABNORMAL LOW (ref 44.0–60.0)
pH, Ven: 7.18 — CL (ref 7.250–7.430)
pO2, Ven: 95 mmHg — ABNORMAL HIGH (ref 32.0–45.0)

## 2021-04-18 LAB — URINALYSIS, COMPLETE (UACMP) WITH MICROSCOPIC
Bilirubin Urine: NEGATIVE
Glucose, UA: >1000 mg/dL — AB
Ketones, ur: >160 mg/dL — AB
Leukocytes,Ua: NEGATIVE
Nitrite: NEGATIVE
Protein, ur: NEGATIVE mg/dL
Specific Gravity, Urine: 1.01 (ref 1.005–1.030)
pH: 5.5 (ref 5.0–8.0)

## 2021-04-18 LAB — LACTIC ACID, PLASMA: Lactic Acid, Venous: 4.1 mmol/L (ref 0.5–1.9)

## 2021-04-18 LAB — CBC WITH DIFFERENTIAL/PLATELET
Abs Immature Granulocytes: 0.1 10*3/uL — ABNORMAL HIGH (ref 0.00–0.07)
Basophils Absolute: 0.1 10*3/uL (ref 0.0–0.1)
Basophils Relative: 0 %
Eosinophils Absolute: 0 10*3/uL (ref 0.0–0.5)
Eosinophils Relative: 0 %
HCT: 42.9 % (ref 36.0–46.0)
Hemoglobin: 13.6 g/dL (ref 12.0–15.0)
Immature Granulocytes: 1 %
Lymphocytes Relative: 3 %
Lymphs Abs: 0.6 10*3/uL — ABNORMAL LOW (ref 0.7–4.0)
MCH: 32.5 pg (ref 26.0–34.0)
MCHC: 31.7 g/dL (ref 30.0–36.0)
MCV: 102.6 fL — ABNORMAL HIGH (ref 80.0–100.0)
Monocytes Absolute: 0.3 10*3/uL (ref 0.1–1.0)
Monocytes Relative: 2 %
Neutro Abs: 18.8 10*3/uL — ABNORMAL HIGH (ref 1.7–7.7)
Neutrophils Relative %: 94 %
Platelets: 340 10*3/uL (ref 150–400)
RBC: 4.18 MIL/uL (ref 3.87–5.11)
RDW: 14.1 % (ref 11.5–15.5)
WBC: 19.8 10*3/uL — ABNORMAL HIGH (ref 4.0–10.5)
nRBC: 0 % (ref 0.0–0.2)

## 2021-04-18 LAB — CBG MONITORING, ED
Glucose-Capillary: 512 mg/dL (ref 70–99)
Glucose-Capillary: 456 mg/dL — ABNORMAL HIGH (ref 70–99)
Glucose-Capillary: 487 mg/dL — ABNORMAL HIGH (ref 70–99)
Glucose-Capillary: 431 mg/dL — ABNORMAL HIGH (ref 70–99)
Glucose-Capillary: 304 mg/dL — ABNORMAL HIGH (ref 70–99)
Glucose-Capillary: 355 mg/dL — ABNORMAL HIGH (ref 70–99)
Glucose-Capillary: 275 mg/dL — ABNORMAL HIGH (ref 70–99)

## 2021-04-18 LAB — RESP PANEL BY RT-PCR (FLU A&B, COVID) ARPGX2
Influenza A by PCR: NEGATIVE
Influenza B by PCR: NEGATIVE
SARS Coronavirus 2 by RT PCR: NEGATIVE

## 2021-04-18 LAB — MAGNESIUM: Magnesium: 2.9 mg/dL — ABNORMAL HIGH (ref 1.7–2.4)

## 2021-04-18 LAB — TROPONIN I (HIGH SENSITIVITY): Troponin I (High Sensitivity): 9 ng/L (ref <18)

## 2021-04-18 LAB — ETHANOL: Alcohol, Ethyl (B): <10 mg/dL (ref <10)

## 2021-04-18 LAB — LIPASE, BLOOD: Lipase: 25 U/L (ref 11–51)

## 2021-04-18 MED ORDER — DILTIAZEM HCL ER COATED BEADS 240 MG PO CP24
240.0000 mg | ORAL_CAPSULE | Freq: Every day | ORAL | Status: DC
  Administered 2021-04-19 – 2021-04-20 (×2): 240 mg via ORAL
  Filled 2021-04-18 (×2): qty 1

## 2021-04-18 MED ORDER — THIAMINE HCL 100 MG/ML IJ SOLN
100.0000 mg | Freq: Every day | INTRAMUSCULAR | Status: DC
  Administered 2021-04-18: 100 mg via INTRAVENOUS
  Filled 2021-04-18: qty 2

## 2021-04-18 MED ORDER — LORAZEPAM 2 MG/ML IJ SOLN
0.0000 mg | Freq: Four times a day (QID) | INTRAMUSCULAR | Status: DC
  Administered 2021-04-18: 1 mg via INTRAVENOUS
  Filled 2021-04-18: qty 1

## 2021-04-18 MED ORDER — LACTATED RINGERS IV BOLUS
500.0000 mL | Freq: Once | INTRAVENOUS | Status: AC
  Administered 2021-04-18: 500 mL via INTRAVENOUS

## 2021-04-18 MED ORDER — LORAZEPAM 2 MG/ML IJ SOLN
1.0000 mg | INTRAMUSCULAR | Status: DC | PRN
  Administered 2021-04-19: 2 mg via INTRAVENOUS
  Filled 2021-04-18: qty 1

## 2021-04-18 MED ORDER — PANTOPRAZOLE SODIUM 40 MG PO TBEC
40.0000 mg | DELAYED_RELEASE_TABLET | Freq: Every day | ORAL | Status: DC
  Administered 2021-04-19 – 2021-04-20 (×2): 40 mg via ORAL
  Filled 2021-04-18 (×2): qty 1

## 2021-04-18 MED ORDER — ADULT MULTIVITAMIN W/MINERALS CH
1.0000 | ORAL_TABLET | Freq: Every day | ORAL | Status: DC
  Administered 2021-04-20: 1 via ORAL
  Filled 2021-04-18: qty 1

## 2021-04-18 MED ORDER — DEXTROSE 50 % IV SOLN: 0.0000 mL | INTRAVENOUS | Status: DC | PRN

## 2021-04-18 MED ORDER — FLUOXETINE HCL 20 MG PO CAPS
40.0000 mg | ORAL_CAPSULE | Freq: Every day | ORAL | Status: DC
  Administered 2021-04-19 – 2021-04-20 (×2): 40 mg via ORAL
  Filled 2021-04-18 (×2): qty 2

## 2021-04-18 MED ORDER — LORAZEPAM 2 MG/ML IJ SOLN
0.5000 mg | Freq: Once | INTRAMUSCULAR | Status: AC
  Administered 2021-04-18: 0.5 mg via INTRAVENOUS
  Filled 2021-04-18: qty 1

## 2021-04-18 MED ORDER — LACTATED RINGERS IV SOLN: INTRAVENOUS | Status: DC

## 2021-04-18 MED ORDER — INSULIN REGULAR(HUMAN) IN NACL 100-0.9 UT/100ML-% IV SOLN
INTRAVENOUS | Status: DC
  Administered 2021-04-18: 4.4 [IU]/h via INTRAVENOUS
  Filled 2021-04-18: qty 100

## 2021-04-18 MED ORDER — SODIUM CHLORIDE 0.9% FLUSH: 10.0000 mL | INTRAVENOUS | Status: DC | PRN

## 2021-04-18 MED ORDER — LORAZEPAM 1 MG PO TABS: 1.0000 mg | ORAL_TABLET | ORAL | Status: DC | PRN

## 2021-04-18 MED ORDER — LORAZEPAM 2 MG PO TABS: 0.0000 mg | ORAL_TABLET | Freq: Four times a day (QID) | ORAL | Status: DC

## 2021-04-18 MED ORDER — LACTATED RINGERS IV BOLUS
2000.0000 mL | Freq: Once | INTRAVENOUS | Status: AC
  Administered 2021-04-18: 2000 mL via INTRAVENOUS

## 2021-04-18 MED ORDER — SODIUM CHLORIDE 0.9% FLUSH
10.0000 mL | Freq: Two times a day (BID) | INTRAVENOUS | Status: DC
  Administered 2021-04-18 – 2021-04-19 (×3): 10 mL

## 2021-04-18 MED ORDER — SODIUM CHLORIDE 0.9 % IV SOLN
100.0000 mg | Freq: Once | INTRAVENOUS | Status: DC
  Filled 2021-04-18: qty 100

## 2021-04-18 MED ORDER — SODIUM CHLORIDE 0.9 % IV SOLN: INTRAVENOUS | Status: DC

## 2021-04-18 MED ORDER — LORAZEPAM 2 MG PO TABS: 0.0000 mg | ORAL_TABLET | Freq: Two times a day (BID) | ORAL | Status: DC

## 2021-04-18 MED ORDER — LORAZEPAM 2 MG/ML IJ SOLN: 0.0000 mg | Freq: Two times a day (BID) | INTRAMUSCULAR | Status: DC

## 2021-04-18 MED ORDER — FOLIC ACID 1 MG PO TABS
1.0000 mg | ORAL_TABLET | Freq: Every day | ORAL | Status: DC
  Administered 2021-04-18 – 2021-04-20 (×3): 1 mg via ORAL
  Filled 2021-04-18 (×3): qty 1

## 2021-04-18 MED ORDER — LACTATED RINGERS IV BOLUS
1000.0000 mL | Freq: Once | INTRAVENOUS | Status: AC
  Administered 2021-04-18: 1000 mL via INTRAVENOUS

## 2021-04-18 MED ORDER — THIAMINE HCL 100 MG PO TABS
100.0000 mg | ORAL_TABLET | Freq: Every day | ORAL | Status: DC
  Administered 2021-04-18 – 2021-04-20 (×3): 100 mg via ORAL
  Filled 2021-04-18 (×3): qty 1

## 2021-04-18 MED ORDER — DEXTROSE IN LACTATED RINGERS 5 % IV SOLN: INTRAVENOUS | Status: DC

## 2021-04-18 MED ORDER — ONDANSETRON HCL 4 MG/2ML IJ SOLN: 4.0000 mg | Freq: Four times a day (QID) | INTRAMUSCULAR | Status: DC | PRN

## 2021-04-18 MED ORDER — NICOTINE 21 MG/24HR TD PT24
21.0000 mg | MEDICATED_PATCH | Freq: Every day | TRANSDERMAL | Status: DC
  Administered 2021-04-18 – 2021-04-20 (×3): 21 mg via TRANSDERMAL
  Filled 2021-04-18 (×3): qty 1

## 2021-04-18 MED ORDER — IOHEXOL 350 MG/ML SOLN
100.0000 mL | Freq: Once | INTRAVENOUS | Status: AC | PRN
  Administered 2021-04-18: 100 mL via INTRAVENOUS

## 2021-04-18 MED ORDER — RIVAROXABAN 20 MG PO TABS
20.0000 mg | ORAL_TABLET | Freq: Every day | ORAL | Status: DC
  Administered 2021-04-19: 17:00:00 20 mg via ORAL
  Filled 2021-04-18 (×3): qty 1

## 2021-04-18 MED ORDER — ALBUTEROL SULFATE (2.5 MG/3ML) 0.083% IN NEBU
3.0000 mL | INHALATION_SOLUTION | Freq: Four times a day (QID) | RESPIRATORY_TRACT | Status: DC | PRN
Start: 2021-04-18 — End: 2021-04-20

## 2021-04-18 MED ORDER — SODIUM CHLORIDE 0.9 % IV SOLN
1.0000 g | Freq: Once | INTRAVENOUS | Status: AC
  Administered 2021-04-18: 1 g via INTRAVENOUS
  Filled 2021-04-18: qty 10

## 2021-04-18 NOTE — ED Triage Notes (Addendum)
Pt to ED [facility] from home  Pt called EMS for midsternal CP and vomiting Pt feels SOB and is anxious. Pt states vomiting and CP started this morning at 0630. Pt cannot describe chest pain but denies radiation.  Hx a fib, a fib 12 lead EMS, HR 80s-140s and LBBB seen on ekg, pt denies hx BBB 100% RA, normal VS  Tongue appears parched. 2 unsuccessful IV attempts by EMS. EKG performed now.  Pt in bed, covered with blankets.

## 2021-04-18 NOTE — Sepsis Progress Note (Signed)
eLink is monitoring this Code Sepsis. °

## 2021-04-18 NOTE — ED Notes (Signed)
Called lab to draw labs.  

## 2021-04-18 NOTE — Progress Notes (Signed)
CODE SEPSIS - PHARMACY COMMUNICATION  **Broad Spectrum Antibiotics should be administered within 1 hour of Sepsis diagnosis**  Time Code Sepsis Called/Page Received: 1751  Antibiotics Ordered: ceftriaxone and doxycycline  Time of 1st antibiotic administration: 1818  Additional action taken by pharmacy: none required  If necessary, Name of Provider/Nurse Contacted: N/A    Dallie Piles ,PharmD Clinical Pharmacist  04/18/2021  5:44 PM

## 2021-04-18 NOTE — ED Provider Notes (Signed)
[facility] Emergency Department Provider Note  ____________________________________________   Event Date/Time   First MD Initiated Contact with Patient 04/18/21 1526     (approximate)  I have reviewed the triage vital signs and the nursing notes.   HISTORY  Chief Complaint Chest Pain and Emesis   HPI Michaela Johnson is a 60 y.o. female with history of COPD, tobacco abuse, uncontrolled diabetes mellitus, hypertension, paroxysmal atrial fibrillation on Xarelto, alcohol abuse who presents for assessment of acute onset of left-sided chest pain rating to her back associate with nonbloody nonbilious vomiting diarrhea, cough, and headache.  Patient states this started on rather suddenly at 2 AM last night.  She states that she has not had any alcohol today but had had several drinks yesterday and is not sure how much he typically drinks.  She denies any fevers, earache, vision changes, burning with urination or blood in her vomit or stool but states she feels achy in her back and abdomen as well.  Denies any recent falls or injuries or other illicit drug use.  No rashes.  No other acute concerns at this time.         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 07/30/2020   Constipation    Depression    Diabetes mellitus without complication ([place])    Dysrhythmia     PAF   GERD (gastroesophageal reflux disease)    Hypertension    Neuropathy    Nicotine dependence 07/31/2020   PAF (paroxysmal atrial fibrillation) ([place])    Panic attacks    Pneumonia    Tobacco use    Vitamin D deficiency     Patient Active Problem List   Diagnosis Date Noted   Community acquired pneumonia 02/10/2021   Alcohol use disorder, moderate, dependence ([place]) 02/10/2021   Vitamin D deficiency    Neuropathy     Chronic anticoagulation 07/31/2020   Chronic alcohol use 07/31/2020   Nicotine dependence 07/31/2020   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 07/30/2020   Substance abuse ([place]) 03/18/2018   Diabetes ([place]) 03/18/2018   H/O medication noncompliance 02/07/2018   PAF (paroxysmal atrial fibrillation) ([place]) 01/30/2018   Atrial fibrillation with rapid ventricular response ([place]) 01/30/2018   Seizure ([place])    DKA (diabetic ketoacidosis) ([place]) 06/07/2016   Essential hypertension 05/05/2016    Past Surgical History:  Procedure Laterality Date   ANKLE FUSION Right 07/30/2020   Procedure: ANKLE FUSION;  Surgeon: Altamese Wimberley, MD;  Location: [place];  Service: Orthopedics;  Laterality: Right;   APPLICATION OF WOUND VAAPPLICATION OF WOUND VAC (Right Ankle)C (Right Ankle)  16/04/9603   APPLICATION OF WOUND VAC Right 07/30/2020   Procedure: APPLICATION OF WOUND VAC;  Surgeon: Altamese Board[place], MD;  Location: [place];  Service: Orthopedics;  Laterality: Right;   HARDWARE REMOVAL Right 07/30/2020   Procedure: HARDWARE REMOVAL RIGHT ANKLE;  Surgeon: Altamese [place], MD;  Location: [place];  Service: Orthopedics;  Laterality: Right;   HARDWARE REMOVAL RIGHT ANKLE (Right Ankle  07/31/2020   ORIF ANKLE FRACTURE Right 07/04/2020   Procedure: OPEN REDUCTION INTERNAL FIXATION (ORIF) ANKLE FRACTURE;  Surgeon: Lovell Sheehan, MD;  Location: [facility];  Service: Orthopedics;  Laterality: Right;    Prior to Admission medications   Medication Sig  Start Date End Date Taking? Authorizing Provider  acetaminophen (TYLENOL) 325 MG tablet TAKE 1-2 TABLETS (325-650 MG TOTAL) BY MOUTH EVERY 6 (SIX) HOURS AS NEEDED FOR MILD PAIN (PAIN SCORE 1-3 OR TEMP > 100.5). 08/03/20 08/03/21  Ainsley Spinner, PA-C  albuterol (PROVENTIL HFA;VENTOLIN HFA) 108 (90 Base) MCG/ACT inhaler Inhale 2 puffs into the lungs every 6 (six) hours as needed for wheezing or shortness of breath. 02/14/18   Pyreddy, Reatha Harps, MD  amLODipine-benazepril  (LOTREL) 5-40 MG capsule Take 1 capsule by mouth daily. 02/06/21   [provider]  Cholecalciferol (VITAMIN D) 125 MCG (5000 UT) CAPS Take 1 capsule by mouth daily. 08/03/20   Ainsley Spinner, PA-C  diltiazem (CARDIZEM CD) 240 MG 24 hr capsule Take 1 capsule by mouth once daily 04/02/21   Minna Merritts, MD  diphenhydrAMINE (BENADRYL) 12.5 MG/5ML liquid Take 25 mg by mouth 4 (four) times daily as needed for itching.    [provider]  FLUoxetine (PROZAC) 40 MG capsule Take 40 mg by mouth daily.    [provider]  gabapentin (NEURONTIN) 300 MG capsule Take 600 mg by mouth at bedtime as needed (Neuropathy). 02/28/20   [provider]  insulin aspart (NOVOLOG) 100 UNIT/ML injection Inject 4 Units into the skin 3 (three) times daily with meals.    [provider]  JARDIANCE 10 MG TABS tablet Take 10 mg by mouth daily. 03/28/21   [provider]  LANTUS SOLOSTAR 100 UNIT/ML Solostar Pen Inject into the skin. 03/27/21   [provider]  lisinopril (ZESTRIL) 40 MG tablet Take 40 mg by mouth daily. 04/16/21   [provider]  Multiple Vitamins-Minerals (CERTAVITE/ANTIOXIDANTS) TABS TAKE 1 TABLET BY MOUTH DAILY 08/03/20 08/03/21  Altamese Winigan, MD  omeprazole (PRILOSEC) 20 MG capsule Take 20 mg by mouth daily.    [provider]  OZEMPIC, 0.25 OR 0.5 MG/DOSE, 2 MG/1.5ML SOPN Inject 0.25 mg into the skin every 7 (seven) days. 02/06/21   [provider]  rivaroxaban (XARELTO) 20 MG TABS tablet Take 1 tablet (20 mg total) by mouth daily with supper. Patient taking differently: Take 20 mg by mouth daily. 03/09/19   Minna Merritts, MD  vitamin C (ASCORBIC ACID) 500 MG tablet TAKE 1 TABLET (500 MG TOTAL) BY MOUTH DAILY. 08/03/20 08/03/21  Ainsley Spinner, PA-C    Allergies Codeine and Levaquin [levofloxacin in d5w]  Family History  Problem Relation Age of Onset   Diabetes Mother    CAD Father    Heart Problems Father    Heart  failure Father     Social History Social History   Tobacco Use   Smoking status: Some Days    Packs/day: 1.00    Years: 41.00    Pack years: 41.00    Types: Cigarettes   Smokeless tobacco: Never  Vaping Use   Vaping Use: Never used  Substance Use Topics   Alcohol use: Yes    Alcohol/week: 6.0 standard drinks    Types: 6 Standard drinks or equivalent per week   Drug use: Yes    Frequency: 7.0 times per week    Types: Marijuana    Review of Systems  Review of Systems  Constitutional:  Positive for malaise/fatigue. Negative for chills and fever.  HENT:  Negative for sore throat.   Eyes:  Negative for pain.  Respiratory:  Positive for cough and shortness of breath. Negative for stridor.   Cardiovascular:  Positive for chest pain.  Gastrointestinal:  Positive for  abdominal pain, diarrhea, nausea and vomiting.  Genitourinary:  Negative for dysuria.  Musculoskeletal:  Positive for myalgias.  Skin:  Negative for rash.  Neurological:  Positive for headaches. Negative for seizures and loss of consciousness.  Psychiatric/Behavioral:  Positive for substance abuse. Negative for suicidal ideas.   All other systems reviewed and are negative.    ____________________________________________   PHYSICAL EXAM:  VITAL SIGNS: ED Triage Vitals  Enc Vitals Group     BP 04/18/21 1523 139/75     Pulse Rate 04/18/21 1523 (!) 127     Resp 04/18/21 1523 20     Temp 04/18/21 1523 (!) 97.5 F (36.4 C)     Temp Source 04/18/21 1523 Oral     SpO2 04/18/21 1523 100 %     Weight 04/18/21 1529 150 lb (68 kg)     Height 04/18/21 1529 _0  (1.575 m)     Head Circumference --      Peak Flow --      Pain Score --      Pain Loc --      Pain Edu? --      Excl. in [place]? --    Vitals:   04/18/21 1722 04/18/21 1730  BP: 127/65 (!) 136/59  Pulse: (!) 127 (!) 125  Resp:  17  Temp:    SpO2:  100%   Physical Exam Vitals and nursing note reviewed.  Constitutional:      General: She is in  acute distress.     Appearance: She is well-developed. She is ill-appearing.  HENT:     Head: Normocephalic and atraumatic.     Right Ear: External ear normal.     Left Ear: External ear normal.     Nose: Nose normal.     Mouth/Throat:     Mouth: Mucous membranes are dry.  Eyes:     Conjunctiva/sclera: Conjunctivae normal.  Cardiovascular:     Rate and Rhythm: Regular rhythm. Tachycardia present.     Heart sounds: No murmur heard. Pulmonary:     Effort: Pulmonary effort is normal. No respiratory distress.     Breath sounds: Normal breath sounds.  Abdominal:     Palpations: Abdomen is soft.     Tenderness: There is abdominal tenderness. There is no right CVA tenderness or left CVA tenderness.  Musculoskeletal:     Cervical back: Neck supple.  Skin:    General: Skin is warm and dry.     Capillary Refill: Capillary refill takes more than 3 seconds.  Neurological:     Mental Status: She is alert and oriented to person, place, and time.    Patient is alert and oriented.  Cranial nerves II through XII are grossly intact.  There is no overlying skin changes over the back.  Lungs are clear bilaterally and abdomen is mildly tender throughout.  No significant CVA tenderness.  Patient is able to move all extremities with symmetric strength.  2+ radial pulse. ____________________________________________   LABS (all labs ordered are listed, but only abnormal results are displayed)  Labs Reviewed  CBC WITH DIFFERENTIAL/PLATELET - Abnormal; Notable for the following components:      Result Value   WBC 19.8 (*)    MCV 102.6 (*)    Neutro Abs 18.8 (*)    Lymphs Abs 0.6 (*)    Abs Immature Granulocytes 0.10 (*)    All other components within normal limits  COMPREHENSIVE METABOLIC PANEL - Abnormal; Notable for the following components:  Sodium 132 (*)    Potassium 5.5 (*)    Chloride 94 (*)    CO2 10 (*)    Glucose, Bld 554 (*)    BUN 36 (*)    Creatinine, Ser 1.28 (*)    Total  Bilirubin 1.9 (*)    GFR, Estimated 48 (*)    Anion gap 28 (*)    All other components within normal limits  MAGNESIUM - Abnormal; Notable for the following components:   Magnesium 2.9 (*)    All other components within normal limits  BETA-HYDROXYBUTYRIC ACID - Abnormal; Notable for the following components:   Beta-Hydroxybutyric Acid >8.00 (*)    All other components within normal limits  BLOOD GAS, VENOUS - Abnormal; Notable for the following components:   pH, Ven 7.18 (*)    pCO2, Ven 20 (*)    pO2, Ven 95.0 (*)    Bicarbonate 7.5 (*)    Acid-base deficit 18.8 (*)    All other components within normal limits  CBG MONITORING, ED - Abnormal; Notable for the following components:   Glucose-Capillary 512 (*)    All other components within normal limits  RESP PANEL BY RT-PCR (FLU A&B, COVID) ARPGX2  URINE CULTURE  CULTURE, BLOOD (ROUTINE X 2)  CULTURE, BLOOD (ROUTINE X 2)  LIPASE, BLOOD  ETHANOL  URINALYSIS, COMPLETE (UACMP) WITH MICROSCOPIC  LACTIC ACID, PLASMA  LACTIC ACID, PLASMA  PROTIME-INR  APTT  BASIC METABOLIC PANEL  BASIC METABOLIC PANEL  BASIC METABOLIC PANEL  BASIC METABOLIC PANEL  BETA-HYDROXYBUTYRIC ACID  BETA-HYDROXYBUTYRIC ACID  CBG MONITORING, ED  TROPONIN I (HIGH SENSITIVITY)  TROPONIN I (HIGH SENSITIVITY)   ____________________________________________  EKG  ECG shows sinus tachycardia and left bundle branch block with a ventricular rate of 126.  There is fairly diffuse ST depressions concerning for demand ischemia.  QTc interval is prolonged at 578. ____________________________________________  RADIOLOGY  ED MD interpretation: Chest x-ray has no evidence of an effusion, focal consolidation, edema, pneumothorax or other clear acute thoracic process.  CTA chest abdomen pelvis shows no evidence of aortic aneurysm or dissection.  There is diffuse bronchial thickening consistent with emphysema and underlying COPD.  There is evidence of paddock steatosis  without evidence of acute pancreatitis, diverticulitis, perinephric stranding or other acute abdominopelvic pathology.  There is evidence of CAD, aortic atherosclerosis, bilateral adrenal gland adenomas and lung nodules.  No evidence of large PE.  Official radiology report(s): DG Chest Portable 1 View  Result Date: 04/18/2021 CLINICAL DATA:  Chest pain EXAM: PORTABLE CHEST 1 VIEW COMPARISON:  Chest radiograph 02/10/2021 FINDINGS: The cardiomediastinal silhouette is within normal limits. There is no focal consolidation or pulmonary edema. There is no pleural effusion or pneumothorax. A prior displaced left clavicle fracture is again seen. IMPRESSION: No radiographic evidence of acute cardiopulmonary process. Electronically Signed   By: Valetta Mole M.D.   On: 04/18/2021 16:14   CT Angio Chest/Abd/Pel for Dissection W and/or Wo Contrast  Result Date: 04/18/2021 CLINICAL DATA:  Midsternal chest pain with vomiting. EXAM: CT ANGIOGRAPHY CHEST, ABDOMEN AND PELVIS TECHNIQUE: Non-contrast CT of the chest was initially obtained. Multidetector CT imaging through the chest, abdomen and pelvis was performed using the standard protocol during bolus administration of intravenous contrast. Multiplanar reconstructed images and MIPs were obtained and reviewed to evaluate the vascular anatomy. CONTRAST:  177m OMNIPAQUE IOHEXOL 350 MG/ML SOLN COMPARISON:  CT AP 06/07/2016 FINDINGS: CTA CHEST FINDINGS Cardiovascular: Preferential opacification of the thoracic aorta. No evidence of thoracic aortic aneurysm or dissection.  Normal heart size. No pericardial effusion. Mild aortic atherosclerosis and coronary artery calcifications. Mediastinum/Nodes: No enlarged mediastinal, hilar, or axillary lymph nodes. Thyroid gland, trachea, and esophagus demonstrate no significant findings. Lungs/Pleura: No pleural effusion, airspace consolidation, atelectasis, or pneumothorax. Mild emphysema with diffuse bronchial wall thickening. Left upper  lobe lung nodule measures 3 mm, image 54/6. Right upper lobe nodule measures 3 mm, image 50/6. Subpleural nodule within the superior segment of right lower lobe abutting the major fissure measures 0.4 x 1.0 cm, image 86/6 (mean diameter 7 mm), image 86/6. Musculoskeletal: No chest wall abnormality. No acute or significant osseous findings. Remote healed left third rib fracture, image 62/6. No acute or suspicious osseous findings. Review of the MIP images confirms the above findings. CTA ABDOMEN AND PELVIS FINDINGS VASCULAR Aorta: Normal caliber aorta without aneurysm, dissection, vasculitis or significant stenosis. Aortic atherosclerosis. Celiac: Patent without evidence of aneurysm, dissection, vasculitis or significant stenosis. SMA: Patent without evidence of aneurysm, dissection, vasculitis or significant stenosis. Renals: Both renal arteries are patent without evidence of aneurysm, dissection, vasculitis, fibromuscular dysplasia or significant stenosis. IMA: Patent without evidence of aneurysm, dissection, vasculitis or significant stenosis. Inflow: Patent without evidence of aneurysm, dissection, vasculitis or significant stenosis. Veins: No obvious venous abnormality within the limitations of this arterial phase study. Review of the MIP images confirms the above findings. NON-VASCULAR Hepatobiliary: Diffuse hepatic steatosis. Pancreas: Unremarkable. No pancreatic ductal dilatation or surrounding inflammatory changes. Spleen: Normal in size without focal abnormality. Adrenals/Urinary Tract: Low-attenuation left adrenal nodule likely represents a benign adenoma measuring 1.3 x 0.7 cm, image 98/5. Similarly, right adrenal nodule also is low attenuation measuring 1.2 x 1.9 cm and likely represents a benign adenoma. No kidney mass or signs of hydronephrosis. No hydroureter or ureteral lithiasis. Urinary bladder unremarkable. Stomach/Bowel: Stomach is within normal limits. Appendix appears normal. No evidence of  bowel wall thickening, distention, or inflammatory changes. Lymphatic: No abdominopelvic adenopathy Reproductive: Uterus and bilateral adnexa are unremarkable. Other: No free fluid or fluid collections.  No pneumoperitoneum Musculoskeletal: No acute findings. Chronic appearing mild superior endplate deformity is noted at the L1 level. No suspicious bone lesions. Review of the MIP images confirms the above findings. IMPRESSION: 1. No evidence for aortic dissection or aneurysm. 2. Emphysema and diffuse bronchial wall thickening with emphysema, as above; imaging findings suggestive of underlying COPD. 3. Hepatic steatosis. 4. Bilateral adrenal gland adenomas. 5. Lung nodules are identified. The largest is in the right lower lobe with a mean diameter of 7 mm. Non-contrast chest CT at 3-6 months is recommended. This recommendation follows the consensus statement: Guidelines for Management of Incidental Pulmonary Nodules Detected on CT Images:From the Fleischner Society 2017; published online before print (10.1148/radiol.[id number]). 6. Aortic Atherosclerosis (ICD10-I70.0) and Emphysema (ICD10-J43.9). 7. Coronary artery calcifications. Electronically Signed   By: Kerby Moors M.D.   On: 04/18/2021 16:50    ____________________________________________   PROCEDURES  Procedure(s) performed (including Critical Care):  .Critical Care Performed by: Lucrezia Starch, MD Authorized by: Lucrezia Starch, MD   Critical care provider statement:    Critical care time (minutes):  45   Critical care was necessary to treat or prevent imminent or life-threatening deterioration of the following conditions:  Sepsis, respiratory failure and endocrine crisis   Critical care was time spent personally by me on the following activities:  Discussions with consultants, evaluation of patient's response to treatment, examination of patient, ordering and performing treatments and interventions, ordering and review of laboratory  studies, ordering and review of radiographic  studies, pulse oximetry, re-evaluation of patient's condition, obtaining history from patient or surrogate and review of old charts   ____________________________________________   INITIAL IMPRESSION / ASSESSMENT AND PLAN / ED COURSE      Patient presents with above-stated history and exam for assessment of multiple symptoms including headache, nausea and vomiting, chest pain, shortness of breath, generalized abdominal pain and some diarrhea that seems began all fairly acutely last night.  Patient states he drinks several alcoholic drinks every day but not sure how much she had last night.  Denies any other illicit drug use or other clear associated sick symptoms.  On arrival she is tachycardic at 127 with otherwise stable vital signs on room air initially although SPO2 did decrease down into the high 80s with a good form for several minutes that she was placed on 2 L nasal cannula with improvement back to the 90s.  Initial differential includes ACS, PE, dissection, pancreatitis, DKA, and sepsis and significant metabolic derangements..  No focal deficits at this time to suggest stroke or and there is no history of recent trauma.  ECG shows tachycardia with left bundle branch block and diffuse changes concerning for significant demand ischemia.  Chest x-ray has no evidence of an effusion, focal consolidation, edema, pneumothorax or other clear acute thoracic process.  CTA chest abdomen pelvis shows no evidence of aortic aneurysm or dissection.  There is diffuse bronchial thickening consistent with emphysema and underlying COPD.  There is evidence of paddock steatosis without evidence of acute pancreatitis, diverticulitis, perinephric stranding or other acute abdominopelvic pathology.  There is evidence of CAD, aortic atherosclerosis, bilateral adrenal gland adenomas and lung nodules.  No evidence of large PE.  COVID and influenza PCR is negative.  CBC  count of 19.8 without evidence of acute anemia and normal platelets.  Opponent none elevated not suggestive of ACS or myocarditis.  Magnesium is 2.9.  Lipase 25.  Serum ethanol undetectable.  VBG with a pH of 7.18 with a PCO2 of 20 and a bicarb of 7.5 consistent with acute metabolic acidosis and reflecting anion gap metabolic acidosis seen on CMP.  CMP remarkable for glucose of 554, K of 5.5, sodium of 132 and a bicarb of 10 with an anion gap of 28.  No evidence of hepatitis.  Given report of increased cough from baseline with multiple SIRS criteria met as well as nausea and vomiting concern for possible sepsis as a precipitant and patient's history of COPD with increased thickening and bronchial wall seen on CTA.  Will treat with Rocephin and Doxy.  Given hyperglycemia and acidosis  with with beta hydroxybutyrate greater than 8 overall picture is concerning for DKA with possible acute infectious process as precipitant versus alcohol abuse.  Patient is not exactly sure when she last took insulin.  She was started on insulin drip.  I will plan to admit to medicine service for further evaluation and management.       ____________________________________________   FINAL CLINICAL IMPRESSION(S) / ED DIAGNOSES  Final diagnoses:  Chest pain, unspecified type  Nausea vomiting and diarrhea  Nonintractable headache, unspecified chronicity pattern, unspecified headache type  Alcohol abuse  Dehydration  Hyperglycemia  Pulmonary nodules  Bilateral adrenal adenomas  Pulmonary emphysema, unspecified emphysema type ([place])  Aortic atherosclerosis ([place])  Coronary artery disease involving native heart with angina pectoris, unspecified vessel or lesion type ([place])  Diabetic ketoacidosis without coma associated with type 2 diabetes mellitus ([facility])    Medications  LORazepam (ATIVAN) injection 0-4 mg (1  mg Intravenous Given 04/18/21 1729)    Or  LORazepam (ATIVAN) tablet 0-4 mg ( Oral See Alternative 04/18/21  1729)  LORazepam (ATIVAN) injection 0-4 mg (has no administration in time range)    Or  LORazepam (ATIVAN) tablet 0-4 mg (has no administration in time range)  thiamine tablet 100 mg ( Oral See Alternative 04/18/21 1724)    Or  thiamine (B-1) injection 100 mg (100 mg Intravenous Given 04/18/21 1724)  cefTRIAXone (ROCEPHIN) 1 g in sodium chloride 0.9 % 100 mL IVPB (has no administration in time range)  doxycycline (VIBRAMYCIN) 100 mg in sodium chloride 0.9 % 250 mL IVPB (has no administration in time range)  lactated ringers infusion (has no administration in time range)  insulin regular, human (MYXREDLIN) 100 units/ 100 mL infusion (has no administration in time range)  dextrose 5 % in lactated ringers infusion (has no administration in time range)  dextrose 50 % solution 0-50 mL (has no administration in time range)  lactated ringers infusion (has no administration in time range)  lactated ringers bolus 500 mL (500 mLs Intravenous New Bag/Given 04/18/21 1602)  LORazepam (ATIVAN) injection 0.5 mg (0.5 mg Intravenous Given 04/18/21 1605)  lactated ringers bolus 500 mL (500 mLs Intravenous New Bag/Given 04/18/21 1704)  iohexol (OMNIPAQUE) 350 MG/ML injection 100 mL (100 mLs Intravenous Contrast Given 04/18/21 1620)  lactated ringers bolus 1,000 mL (1,000 mLs Intravenous New Bag/Given 04/18/21 1721)     ED Discharge Orders     None        Note:  This document was prepared using Dragon voice recognition software and may include unintentional dictation errors.    Lucrezia Starch, MD 04/18/21 [phone]

## 2021-04-18 NOTE — ED Notes (Signed)
Lab came and drew ordered labs. 

## 2021-04-18 NOTE — ED Notes (Signed)
Dr Tamala Julian at bedside.  Pt placed on 2L oxygen after was desaturating to 80% on RA.

## 2021-04-18 NOTE — ED Notes (Signed)
Per endotool, start insulin at 4.4 units/hr.  Lab sending doxycycline.

## 2021-04-18 NOTE — ED Notes (Signed)
Cbg [phone].

## 2021-04-18 NOTE — H&P (Signed)
History and Physical    Michaela Johnson GDJ:[id number] DOB: June 30, 1961 DOA: 04/18/2021  PCP: [name], NP  Patient coming from: Home via EMS  I have personally briefly reviewed patient's old medical records in [place]  Chief Complaint: Nausea, vomiting  HPI: Michaela Johnson is a 60 y.o. female with medical history significant for paroxysmal atrial fibrillation on Xarelto, insulin-dependent type 2 diabetes, COPD, hypertension, LBBB, alcohol and tobacco use who presented to the ED for evaluation of nausea and vomiting.  Patient reports new onset of substernal chest discomfort, epigastric pain, nausea, vomiting, one episode of diarrhea, chills, diaphoresis beginning morning of admission (04/18/2020).  She denies any radiation of her chest discomfort.  She reports occasional shortness of breath with cough productive of thick white sputum.  She is a chronic active smoker of 1 pack/day.  She does report drinking 2 to 3 glasses of bourbon nightly with last drink night prior to admission.  She does report experiencing shakes/tremors on occasion when she stops drinking.  She reports good urine output and denies any dysuria.  Patient states she was recently started on Jardiance for her diabetes.  She states that she is also taking Ozempic once weekly..  She says she is also using Lantus and NovoLog but cannot tell me how many units of each she uses.  ED Course:  Initial vitals showed BP 148/72, pulse 128, RR 24, temp 97.5 F, SPO2 93% on room air.  Labs significant for serum glucose 554, anion gap 28, potassium 5.5, BUN 36, creatinine 1.28 (previous 0.78 on 02/11/2021), sodium 132 (143 when corrected for hyperglycemia), AST 21, ALT 20, alkaline phosphatase 77, total bilirubin 1.9, magnesium 2.9, lipase 25, serum ethanol <10, beta hydroxybutyrate >8.  VBG showed pH 7.18, PCO2 20, PO2 95.  SARS-CoV-2 and influenza PCR's are negative.  High-sensitivity troponin 9x2.  Urinalysis shows >1000  glucose, >160 ketones, negative protein, negative nitrites, negative leukocytes, 0-5 WBC/hpf, 6-10 RBC/hpf, rare bacteria on microscopy.  Urine and blood cultures collected and pending.  Lactic acid 4.1.  Portable chest x-ray is negative for focal consolidation, edema, or effusion.  CTA chest is negative for evidence of aortic dissection or aneurysm.  Emphysematous changes with diffuse bronchial wall thickening noted.  Hepatic steatosis, bilateral adrenal gland adenomas seen.  Multiple lung nodules identified, largest in the right lower lobe with a mean diameter of 7 mm.  Patient was given 2 L LR and placed on CIWA protocol.  She was given Ativan 0.5 mg and 1 mg.  She received IV ceftriaxone and doxycycline for concern of underlying infection.  She was started on insulin infusion and the hospitalist service was consulted to admit for further evaluation and management.  Review of Systems: All systems reviewed and are negative except as documented in history of present illness above.   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 07/30/2020   Constipation    Depression    Diabetes mellitus without complication (HCC)    Dysrhythmia     PAF   GERD (gastroesophageal reflux disease)    Hypertension    Neuropathy    Nicotine dependence 07/31/2020   PAF (paroxysmal atrial fibrillation) (HCC)    Panic attacks    Pneumonia    Tobacco use    Vitamin D deficiency  Past Surgical History:  Procedure Laterality Date   ANKLE FUSION Right 07/30/2020   Procedure: ANKLE FUSION;  Surgeon: Altamese Garfield, MD;  Location: [place];  Service: Orthopedics;  Laterality: Right;   APPLICATION OF WOUND VAAPPLICATION OF WOUND VAC (Right Ankle)C (Right Ankle)  18/84/1660   APPLICATION OF WOUND VAC Right 07/30/2020   Procedure: APPLICATION  OF WOUND VAC;  Surgeon: Altamese Hadley, MD;  Location: [place];  Service: Orthopedics;  Laterality: Right;   HARDWARE REMOVAL Right 07/30/2020   Procedure: HARDWARE REMOVAL RIGHT ANKLE;  Surgeon: Altamese Salunga, MD;  Location: [place];  Service: Orthopedics;  Laterality: Right;   HARDWARE REMOVAL RIGHT ANKLE (Right Ankle  07/31/2020   ORIF ANKLE FRACTURE Right 07/04/2020   Procedure: OPEN REDUCTION INTERNAL FIXATION (ORIF) ANKLE FRACTURE;  Surgeon: Lovell Sheehan, MD;  Location: [facility];  Service: Orthopedics;  Laterality: Right;    Social History:  reports that she has been smoking cigarettes. She has a 41.00 pack-year smoking history. She has never used smokeless tobacco. She reports current alcohol use of about 6.0 standard drinks per week. She reports current drug use. Frequency: 7.00 times per week. Drug: Marijuana.  Allergies  Allergen Reactions   Codeine Itching   Levaquin [Levofloxacin In D5w] Itching    Family History  Problem Relation Age of Onset   Diabetes Mother    CAD Father    Heart Problems Father    Heart failure Father      Prior to Admission medications   Medication Sig Start Date End Date Taking? Authorizing Provider  acetaminophen (TYLENOL) 325 MG tablet TAKE 1-2 TABLETS (325-650 MG TOTAL) BY MOUTH EVERY 6 (SIX) HOURS AS NEEDED FOR MILD PAIN (PAIN SCORE 1-3 OR TEMP > 100.5). 08/03/20 08/03/21  Ainsley Spinner, PA-C  albuterol (PROVENTIL HFA;VENTOLIN HFA) 108 (90 Base) MCG/ACT inhaler Inhale 2 puffs into the lungs every 6 (six) hours as needed for wheezing or shortness of breath. 02/14/18   Pyreddy, Reatha Harps, MD  amLODipine-benazepril (LOTREL) 5-40 MG capsule Take 1 capsule by mouth daily. 02/06/21   [provider]  Cholecalciferol (VITAMIN D) 125 MCG (5000 UT) CAPS Take 1 capsule by mouth daily. 08/03/20   Ainsley Spinner, PA-C  diltiazem (CARDIZEM CD) 240 MG 24 hr capsule Take 1 capsule by mouth once daily 04/02/21   Minna Merritts, MD  diphenhydrAMINE (BENADRYL) 12.5  MG/5ML liquid Take 25 mg by mouth 4 (four) times daily as needed for itching.    [provider]  FLUoxetine (PROZAC) 40 MG capsule Take 40 mg by mouth daily.    [provider]  gabapentin (NEURONTIN) 300 MG capsule Take 600 mg by mouth at bedtime as needed (Neuropathy). 02/28/20   [provider]  insulin aspart (NOVOLOG) 100 UNIT/ML injection Inject 4 Units into the skin 3 (three) times daily with meals.    [provider]  JARDIANCE 10 MG TABS tablet Take 10 mg by mouth daily. 03/28/21   [provider]  LANTUS SOLOSTAR 100 UNIT/ML Solostar Pen Inject into the skin. 03/27/21   [provider]  lisinopril (ZESTRIL) 40 MG tablet Take 40 mg by mouth daily. 04/16/21   [provider]  Multiple Vitamins-Minerals (CERTAVITE/ANTIOXIDANTS) TABS TAKE 1 TABLET BY MOUTH DAILY 08/03/20 08/03/21  Altamese , MD  omeprazole (PRILOSEC) 20 MG capsule Take 20 mg by mouth daily.    [provider]  OZEMPIC, 0.25 OR 0.5 MG/DOSE, 2 MG/1.5ML SOPN Inject 0.25 mg into the skin every 7 (seven) days. 02/06/21  [provider]  rivaroxaban (XARELTO) 20 MG TABS tablet Take 1 tablet (20 mg total) by mouth daily with supper. Patient taking differently: Take 20 mg by mouth daily. 03/09/19   Minna Merritts, MD  vitamin C (ASCORBIC ACID) 500 MG tablet TAKE 1 TABLET (500 MG TOTAL) BY MOUTH DAILY. 08/03/20 08/03/21  Ainsley Spinner, PA-C    Physical Exam: Vitals:   04/18/21 1600 04/18/21 1722 04/18/21 1730 04/18/21 1830  BP:  127/65 (!) 136/59 (!) 113/49  Pulse: (!) 131 (!) 127 (!) 125 (!) 130  Resp: 19  17 20   Temp:      TempSrc:      SpO2: 100%  100% 100%  Weight:      Height:       Constitutional: Resting supine in bed, appears tired but in NAD, calm, comfortable Eyes: PERRL, lids and conjunctivae normal ENMT: Mucous membranes are dry. Posterior pharynx clear of any exudate or lesions.Normal dentition.  Neck: normal, supple, no  masses. Respiratory: clear to auscultation bilaterally, no wheezing, no crackles. Normal respiratory effort. No accessory muscle use.  Cardiovascular: Tachycardic with regular rhythm, no murmurs / rubs / gallops. No extremity edema. 2+ pedal pulses. Abdomen: no tenderness, no masses palpated. Bowel sounds positive.  Musculoskeletal: no clubbing / cyanosis. No joint deformity upper and lower extremities. Good ROM, no contractures. Normal muscle tone.  Skin: no rashes, lesions, ulcers. No induration Neurologic: CN 2-12 grossly intact. Sensation intact. Strength 5/5 in all 4.  Psychiatric: Normal judgment and insight. Alert and oriented x 3. Normal mood.   Labs on Admission: I have personally reviewed following labs and imaging studies  CBC: Recent Labs  Lab 04/18/21 1710  WBC 19.8*  NEUTROABS 18.8*  HGB 13.6  HCT 42.9  MCV 102.6*  PLT 295   Basic Metabolic Panel: Recent Labs  Lab 04/18/21 1710  NA 132*  K 5.5*  CL 94*  CO2 10*  GLUCOSE 554*  BUN 36*  CREATININE 1.28*  CALCIUM 9.6  MG 2.9*   GFR: Estimated Creatinine Clearance: 41.8 mL/min (A) (by C-G formula based on SCr of 1.28 mg/dL (H)). Liver Function Tests: Recent Labs  Lab 04/18/21 1710  AST 21  ALT 20  ALKPHOS 77  BILITOT 1.9*  PROT 8.0  ALBUMIN 3.9   Recent Labs  Lab 04/18/21 1710  LIPASE 25   No results for input(s): AMMONIA in the last 168 hours. Coagulation Profile: No results for input(s): INR, PROTIME in the last 168 hours. Cardiac Enzymes: No results for input(s): CKTOTAL, CKMB, CKMBINDEX, TROPONINI in the last 168 hours. BNP (last 3 results) No results for input(s): PROBNP in the last 8760 hours. HbA1C: No results for input(s): HGBA1C in the last 72 hours. CBG: Recent Labs  Lab 04/18/21 1609 04/18/21 1834  GLUCAP 512* 487*   Lipid Profile: No results for input(s): CHOL, HDL, LDLCALC, TRIG, CHOLHDL, LDLDIRECT in the last 72 hours. Thyroid Function Tests: No results for input(s):  TSH, T4TOTAL, FREET4, T3FREE, THYROIDAB in the last 72 hours. Anemia Panel: No results for input(s): VITAMINB12, FOLATE, FERRITIN, TIBC, IRON, RETICCTPCT in the last 72 hours. Urine analysis:    Component Value Date/Time   COLORURINE STRAW (A) 02/11/2021 1350   APPEARANCEUR CLEAR (A) 02/11/2021 1350   LABSPEC 1.014 02/11/2021 1350   PHURINE 6.0 02/11/2021 1350   GLUCOSEU >=500 (A) 02/11/2021 1350   HGBUR SMALL (A) 02/11/2021 1350   BILIRUBINUR NEGATIVE 02/11/2021 1350   KETONESUR 20 (A) 02/11/2021 1350   PROTEINUR NEGATIVE 02/11/2021  [place] 02/11/2021 [place] 02/11/2021 1350    Radiological Exams on Admission: DG Chest Portable 1 View  Result Date: 04/18/2021 CLINICAL DATA:  Chest pain EXAM: PORTABLE CHEST 1 VIEW COMPARISON:  Chest radiograph 02/10/2021 FINDINGS: The cardiomediastinal silhouette is within normal limits. There is no focal consolidation or pulmonary edema. There is no pleural effusion or pneumothorax. A prior displaced left clavicle fracture is again seen. IMPRESSION: No radiographic evidence of acute cardiopulmonary process. Electronically Signed   By: Valetta Mole M.D.   On: 04/18/2021 16:14   CT Angio Chest/Abd/Pel for Dissection W and/or Wo Contrast  Result Date: 04/18/2021 CLINICAL DATA:  Midsternal chest pain with vomiting. EXAM: CT ANGIOGRAPHY CHEST, ABDOMEN AND PELVIS TECHNIQUE: Non-contrast CT of the chest was initially obtained. Multidetector CT imaging through the chest, abdomen and pelvis was performed using the standard protocol during bolus administration of intravenous contrast. Multiplanar reconstructed images and MIPs were obtained and reviewed to evaluate the vascular anatomy. CONTRAST:  17mL OMNIPAQUE IOHEXOL 350 MG/ML SOLN COMPARISON:  CT AP 06/07/2016 FINDINGS: CTA CHEST FINDINGS Cardiovascular: Preferential opacification of the thoracic aorta. No evidence of thoracic aortic aneurysm or dissection. Normal heart size.  No pericardial effusion. Mild aortic atherosclerosis and coronary artery calcifications. Mediastinum/Nodes: No enlarged mediastinal, hilar, or axillary lymph nodes. Thyroid gland, trachea, and esophagus demonstrate no significant findings. Lungs/Pleura: No pleural effusion, airspace consolidation, atelectasis, or pneumothorax. Mild emphysema with diffuse bronchial wall thickening. Left upper lobe lung nodule measures 3 mm, image 54/6. Right upper lobe nodule measures 3 mm, image 50/6. Subpleural nodule within the superior segment of right lower lobe abutting the major fissure measures 0.4 x 1.0 cm, image 86/6 (mean diameter 7 mm), image 86/6. Musculoskeletal: No chest wall abnormality. No acute or significant osseous findings. Remote healed left third rib fracture, image 62/6. No acute or suspicious osseous findings. Review of the MIP images confirms the above findings. CTA ABDOMEN AND PELVIS FINDINGS VASCULAR Aorta: Normal caliber aorta without aneurysm, dissection, vasculitis or significant stenosis. Aortic atherosclerosis. Celiac: Patent without evidence of aneurysm, dissection, vasculitis or significant stenosis. SMA: Patent without evidence of aneurysm, dissection, vasculitis or significant stenosis. Renals: Both renal arteries are patent without evidence of aneurysm, dissection, vasculitis, fibromuscular dysplasia or significant stenosis. IMA: Patent without evidence of aneurysm, dissection, vasculitis or significant stenosis. Inflow: Patent without evidence of aneurysm, dissection, vasculitis or significant stenosis. Veins: No obvious venous abnormality within the limitations of this arterial phase study. Review of the MIP images confirms the above findings. NON-VASCULAR Hepatobiliary: Diffuse hepatic steatosis. Pancreas: Unremarkable. No pancreatic ductal dilatation or surrounding inflammatory changes. Spleen: Normal in size without focal abnormality. Adrenals/Urinary Tract: Low-attenuation left adrenal  nodule likely represents a benign adenoma measuring 1.3 x 0.7 cm, image 98/5. Similarly, right adrenal nodule also is low attenuation measuring 1.2 x 1.9 cm and likely represents a benign adenoma. No kidney mass or signs of hydronephrosis. No hydroureter or ureteral lithiasis. Urinary bladder unremarkable. Stomach/Bowel: Stomach is within normal limits. Appendix appears normal. No evidence of bowel wall thickening, distention, or inflammatory changes. Lymphatic: No abdominopelvic adenopathy Reproductive: Uterus and bilateral adnexa are unremarkable. Other: No free fluid or fluid collections.  No pneumoperitoneum Musculoskeletal: No acute findings. Chronic appearing mild superior endplate deformity is noted at the L1 level. No suspicious bone lesions. Review of the MIP images confirms the above findings. IMPRESSION: 1. No evidence for aortic dissection or aneurysm. 2. Emphysema and diffuse bronchial wall thickening with emphysema, as above; imaging findings  suggestive of underlying COPD. 3. Hepatic steatosis. 4. Bilateral adrenal gland adenomas. 5. Lung nodules are identified. The largest is in the right lower lobe with a mean diameter of 7 mm. Non-contrast chest CT at 3-6 months is recommended. This recommendation follows the consensus statement: Guidelines for Management of Incidental Pulmonary Nodules Detected on CT Images:From the Fleischner Society 2017; published online before print (10.1148/radiol.3888280034). 6. Aortic Atherosclerosis (ICD10-I70.0) and Emphysema (ICD10-J43.9). 7. Coronary artery calcifications. Electronically Signed   By: Kerby Moors M.D.   On: 04/18/2021 16:50    EKG: Personally reviewed. Sinus tachycardia, rate 126, LBBB.  Rate is faster when compared to prior.  Assessment/Plan Principal Problem:   Diabetic ketoacidosis associated with type 2 diabetes mellitus ([place]) Active Problems:   PAF (paroxysmal atrial fibrillation) ([place])   Hypertension associated with diabetes ([place])    Alcohol use disorder, moderate, dependence (HCC)   AKI (acute kidney injury) ([place])   Hyperkalemia   Multiple lung nodules on CT   Michaela Johnson is a 60 y.o. female with medical history significant for paroxysmal atrial fibrillation on Xarelto, insulin-dependent type 2 diabetes, COPD, hypertension, LBBB, alcohol and tobacco use who is admitted with DKA.  Diabetic ketoacidosis in insulin-dependent type 2 diabetes: Presenting with serum glucose 534, anion gap 28, beta hydroxybutyrate >8, ketonuria, and pH 7.18 on vBG consistent with DKA.  She says she was recently started on Jardiance, which may not be a good long-term option for her given her ketosis-prone diabetes.  Last hemoglobin A1c 10.3% on 02/09/2021. -Continue insulin infusion per protocol -Continue IV NS@125  mils/hour and switch to D5-1/2 NS with CBG <250 -Follow serial BMP, transition to subcutaneous insulin as able -Keep n.p.o., can have noncaloric liquids as tolerated  Acute kidney injury: Prerenal secondary to dehydration in setting of DKA.  Continue IV fluids as above and repeat labs in AM.  Hyperkalemia: In setting of AKI/DKA.  Continue fluids and IV insulin as above.  Leukocytosis: No obvious ongoing infection, likely reactive.  Continue to monitor.  Paroxysmal atrial fibrillation: Tachycardic due to dehydration but in sinus rhythm on admission.  Continue Xarelto and resume diltiazem.  Hypertension: BP on softer side on admission, continue IV fluid hydration and plan to resume home diltiazem in a.m. Hold ACE/ARB in setting of AKI.  COPD with ongoing tobacco use: Stable without acute exacerbation.  Continue albuterol as needed.  Smoking cessation advised.  Nicotine patch ordered.  Alcohol use: Serum ethanol undetectable on admission.  Placed on CIWA protocol with Ativan as needed.  Depression/anxiety: Continue fluoxetine.  Bilateral adrenal gland adenomas: Incidental finding on CT imaging.  Lung nodules seen on CT  imaging: Multiple lung nodules CT on CT imaging, largest in the right lower lobe with mean diameter 7 mm.  Noncontrast chest CT at 3-6 months is recommended.  Findings discussed with patient.  DVT prophylaxis: Xarelto Code Status: DNR, confirmed with patient on admission Family Communication: Discussed with patient, she will discuss with family Disposition Plan: From home and likely return home pending clinical progress Consults called: None Level of care: Stepdown Admission status:  Status is: Inpatient  Remains inpatient appropriate because:Persistent severe electrolyte disturbances, IV treatments appropriate due to intensity of illness or inability to take PO, and Inpatient level of care appropriate due to severity of illness  Dispo: The patient is from: Home              Anticipated d/c is to: Home              Patient  currently is not medically stable to d/c.   Difficult to place patient No   Zada Finders MD [facility]  If 7PM-7AM, please contact night-coverage [email or web address]  04/18/2021, 6:43 PM

## 2021-04-18 NOTE — ED Notes (Signed)
CBG checked because pt is shaky and vomiting. CBG is 512. Reported to EDP. See new orders.

## 2021-04-18 NOTE — ED Notes (Signed)
Called CT to inform pt has 20g IV.  Pt vomiting.

## 2021-04-18 NOTE — ED Notes (Signed)
Lab was only able to draw 1 set cultures, 1 lactic, and half of blue top. She will return in 30 min to attempt redraw.

## 2021-04-18 NOTE — ED Notes (Signed)
Lab will return to draw newly ordered labs.

## 2021-04-19 LAB — BASIC METABOLIC PANEL
Anion gap: 11 (ref 5–15)
Anion gap: 9 (ref 5–15)
BUN: 23 mg/dL (ref 8–23)
BUN: 27 mg/dL — ABNORMAL HIGH (ref 8–23)
CO2: 20 mmol/L — ABNORMAL LOW (ref 22–32)
CO2: 26 mmol/L (ref 22–32)
Calcium: 8.5 mg/dL — ABNORMAL LOW (ref 8.9–10.3)
Calcium: 8.9 mg/dL (ref 8.9–10.3)
Chloride: 104 mmol/L (ref 98–111)
Chloride: 104 mmol/L (ref 98–111)
Creatinine, Ser: 0.74 mg/dL (ref 0.44–1.00)
Creatinine, Ser: 0.91 mg/dL (ref 0.44–1.00)
GFR, Estimated: >60 mL/min (ref >60)
GFR, Estimated: >60 mL/min (ref >60)
Glucose, Bld: 141 mg/dL — ABNORMAL HIGH (ref 70–99)
Glucose, Bld: 199 mg/dL — ABNORMAL HIGH (ref 70–99)
Potassium: 4.1 mmol/L (ref 3.5–5.1)
Potassium: 4.5 mmol/L (ref 3.5–5.1)
Sodium: 135 mmol/L (ref 135–145)
Sodium: 139 mmol/L (ref 135–145)

## 2021-04-19 LAB — URINE CULTURE: Culture: NO GROWTH

## 2021-04-19 LAB — CBG MONITORING, ED
Glucose-Capillary: 127 mg/dL — ABNORMAL HIGH (ref 70–99)
Glucose-Capillary: 146 mg/dL — ABNORMAL HIGH (ref 70–99)
Glucose-Capillary: 179 mg/dL — ABNORMAL HIGH (ref 70–99)
Glucose-Capillary: 180 mg/dL — ABNORMAL HIGH (ref 70–99)
Glucose-Capillary: 183 mg/dL — ABNORMAL HIGH (ref 70–99)
Glucose-Capillary: 198 mg/dL — ABNORMAL HIGH (ref 70–99)
Glucose-Capillary: 204 mg/dL — ABNORMAL HIGH (ref 70–99)
Glucose-Capillary: 238 mg/dL — ABNORMAL HIGH (ref 70–99)

## 2021-04-19 LAB — MAGNESIUM: Magnesium: 2.4 mg/dL (ref 1.7–2.4)

## 2021-04-19 LAB — GLUCOSE, CAPILLARY: Glucose-Capillary: 138 mg/dL — ABNORMAL HIGH (ref 70–99)

## 2021-04-19 LAB — LACTIC ACID, PLASMA: Lactic Acid, Venous: 1.4 mmol/L (ref 0.5–1.9)

## 2021-04-19 LAB — PHOSPHORUS: Phosphorus: 3.8 mg/dL (ref 2.5–4.6)

## 2021-04-19 MED ORDER — INSULIN ASPART 100 UNIT/ML IJ SOLN
5.0000 [IU] | Freq: Three times a day (TID) | INTRAMUSCULAR | Status: DC
  Administered 2021-04-20 (×2): 5 [IU] via SUBCUTANEOUS
  Filled 2021-04-19 (×2): qty 1

## 2021-04-19 MED ORDER — INSULIN ASPART 100 UNIT/ML IJ SOLN
0.0000 [IU] | Freq: Three times a day (TID) | INTRAMUSCULAR | Status: DC
  Administered 2021-04-20 (×2): 7 [IU] via SUBCUTANEOUS
  Filled 2021-04-19 (×2): qty 1

## 2021-04-19 MED ORDER — INSULIN GLARGINE-YFGN 100 UNIT/ML ~~LOC~~ SOLN
10.0000 [IU] | SUBCUTANEOUS | Status: DC
  Administered 2021-04-19 – 2021-04-20 (×2): 10 [IU] via SUBCUTANEOUS
  Filled 2021-04-19 (×2): qty 0.1

## 2021-04-19 MED ORDER — INFLUENZA VAC SPLIT QUAD 0.5 ML IM SUSY
0.5000 mL | PREFILLED_SYRINGE | INTRAMUSCULAR | Status: AC
  Administered 2021-04-20: 13:00:00 0.5 mL via INTRAMUSCULAR
  Filled 2021-04-19: qty 0.5

## 2021-04-19 MED ORDER — ACETAMINOPHEN 325 MG PO TABS: 650.0000 mg | ORAL_TABLET | Freq: Four times a day (QID) | ORAL | Status: DC | PRN

## 2021-04-19 MED ORDER — INSULIN ASPART 100 UNIT/ML IJ SOLN
0.0000 [IU] | Freq: Three times a day (TID) | INTRAMUSCULAR | Status: DC
  Administered 2021-04-19 (×2): 7 [IU] via SUBCUTANEOUS
  Administered 2021-04-19: 21:00:00 3 [IU] via SUBCUTANEOUS
  Administered 2021-04-19: 2 [IU] via SUBCUTANEOUS
  Filled 2021-04-19 (×4): qty 1

## 2021-04-19 NOTE — ED Notes (Signed)
Elsie Amis RN aware of assigned bed

## 2021-04-19 NOTE — ED Notes (Signed)
Pt resting comfortably at this time. CBG 127. Purewick container emptied and documented in I'O's. Pt denies any needs at this time. Pt denies any further needs at this time. Call bell in reach.

## 2021-04-19 NOTE — Progress Notes (Addendum)
Inpatient Diabetes Program Recommendations  AACE/ADA: New Consensus Statement on Inpatient Glycemic Control (2015)  Target Ranges:  Prepandial:   less than 140 mg/dL      Peak postprandial:   less than 180 mg/dL (1-2 hours)      Critically ill patients:  140 - 180 mg/dL  Results for Michaela Johnson, Michaela Johnson (MRN [id number]) as of 04/19/2021 07:09  Ref. Range 04/18/2021 17:10  Beta-Hydroxybutyric Acid Latest Ref Range: 0.05 - 0.27 mmol/L >8.00 (H)  Results for Michaela Johnson, Michaela Johnson (MRN [id number]) as of 04/19/2021 07:09  Ref. Range 04/18/2021 17:10  Glucose Latest Ref Range: 70 - 99 mg/dL [facility])  Results for Michaela Johnson, Michaela Johnson (MRN [id number]) as of 04/19/2021 07:09  Ref. Range 04/18/2021 16:09 04/18/2021 18:34 04/18/2021 19:27 04/18/2021 20:05 04/18/2021 20:41 04/18/2021 21:22 04/18/2021 22:42 04/19/2021 00:03 04/19/2021 01:28 04/19/2021 02:49 04/19/2021 04:01 04/19/2021 05:39  Glucose-Capillary Latest Ref Range: 70 - 99 mg/dL 512 (HH) 487 (H)  IV Insulin Drip Started 456 (H) 431 (H) 355 (H) 304 (H) 275 (H) 183 (H) 179 (H) 198 (H)  10 units Semglee _0  180 (H) 146 (H)  IV Insulin Drip Stopped 0541    Admit with: DKA "Patient states she was recently started on Jardiance for her diabetes.  She states that she is also taking Ozempic once weekly.  She says she is also using Lantus and NovoLog but cannot tell me how many units of each she uses."  History: DM2, ETOH Use  Home DM Meds: Novolog 4 units TID       Lantus 45 units Daily       Jardiance 10 mg Daily       Ozempic 0.25 mg Qweek  Current Orders: IV Insulin Drip     Novolog Resistant Correction Scale/ SSI (0-20 units) TID AC + HS     Semglee 10 units Q24H   PCP: Dr. Leonette Monarch, PA with [facility] seen 11/13/2020 [name], NP?? with [facility]    MD- May need to Stop Jardiance at time of discharge due to admission for DKA   Please also consider checking Current A1c level   Addendum 10:50am--Met w/ pt this AM in the ED to discuss current A1c,  admission DKA, home meds, etc.  Pt was sleeping but awoke easily and alert once awake.  Told me her Novolog insulin was stopped prior to her last admission (last admission was January 2022)--Pt could not recall exactly when her Novolog was stopped but knows it was stopped months ago.  Told me she sees [name], NP with [facility] now--No longer seeing Dr. Sherilyn Cooter with [facility].  Told me her current PCP started her on Jardiance several months ago.  Has CBG meter at home but only checking once daily.  Stated her current dose of Lantus is 37 units Daily and she is still taking the once weekly Ozempic injection.    Discussed with patient diagnosis of DKA (pathophysiology), treatment of DKA, lab results, and transition plan to SQ insulin regimen.  Explained to pt that we now have her on Semglee (similar to Lantus) and Novolog SSI.  Also reviewed current A1c of 10.3% with pt and reminded her about her goal A1c being in the 7% range.  Reviewed goal CBGs for home and encouraged pt to check her CBGs more often at home.  Discussed with pt that the MD may decide to stop her Jardiance at time of d/c since she was admitted with DKA and this medication can increase risk of DKA in ketosis prone  patients.  Also discussed with pt that the MD's will review her Novolog insulin needs in the hospital and they could/may decide to restart Novolog when she goes home.  Pt said she still has some Novolog insulin at home and has been storing it in the fridge.  Is OK resuming Novolog at home if needed.  Reminded pt that if the MD resumes her Novolog for home that she should check the date on the insulin to make sure it is still good and in date.  Encouraged pt to check her CBGs at least BID at home (1st thing in the AM everyday and vary the 2nd check either before another meal or 2-hrs after a meal)--If pt to go home on Novolog again, encouraged her to check her CBGs TID before meals and occasionally after meals and to record all CBGs  for her PCP to review.    --Will follow patient during hospitalization--  Wyn Quaker RN, MSN, CDE Diabetes Coordinator Inpatient Glycemic Control Team Team Pager: [phone] (8a-5p)

## 2021-04-19 NOTE — Progress Notes (Signed)
PROGRESS NOTE    Everest Hacking  FTD:[id number] DOB: [birth year] DOA: 04/18/2021 PCP: [name], NP  104A/104A-AA   Assessment & Plan:   Principal Problem:   Diabetic ketoacidosis associated with type 2 diabetes mellitus ([place]) Active Problems:   DKA (diabetic ketoacidosis) ([place])   PAF (paroxysmal atrial fibrillation) ([place])   Hypertension associated with diabetes ([place])   Alcohol use disorder, moderate, dependence ([place])   AKI (acute kidney injury) ([place])   Hyperkalemia   Multiple lung nodules on CT   Michaela Johnson is a 60 y.o. female with medical history significant for paroxysmal atrial fibrillation on Xarelto, insulin-dependent type 2 diabetes, COPD, hypertension, LBBB, alcohol and tobacco use who presented to the ED for evaluation of nausea and vomiting.   Patient states she was recently started on Jardiance for her diabetes.  She states that she is also taking Ozempic once weekly..  She says she is also using Lantus and NovoLog but cannot tell me how many units of each she uses.   Diabetic ketoacidosis in insulin-dependent type 2 diabetes: Presenting with serum glucose 534, anion gap 28, beta hydroxybutyrate >8, ketonuria, and pH 7.18 on vBG consistent with DKA.  She says she was recently started on Jardiance, which may not be a good long-term option for her given her ketosis-prone diabetes.  Last hemoglobin A1c 10.3% on 02/09/2021. --gap closed, bicarb normalized, insulin gtt transitioned to glargine early this morning Plan: --cont glargine 10u daily --SSI TID --add mealtime 5u TID   Acute kidney injury: Prerenal secondary to dehydration in setting of DKA.  --resolved after IVF. --oral hydration now   Hyperkalemia: In setting of AKI/DKA.  resolved with fluids and IV insulin as above.   Leukocytosis: No obvious ongoing infection, likely reactive.   Continue to monitor.   Paroxysmal atrial fibrillation: Tachycardic due to dehydration but in sinus rhythm on admission.    --cont cardizem --cont Xarelto   Hypertension: BP on softer side on admission --cont Cardizem --Hold ACE/ARB in setting of AKI and soft BP   COPD with ongoing tobacco use: Stable without acute exacerbation.   Smoking cessation advised.   Continue albuterol as needed.  cont nicotine patch.   Alcohol use: Serum ethanol undetectable on admission.  Placed on CIWA protocol with Ativan as needed.   Depression/anxiety: Continue fluoxetine.   Bilateral adrenal gland adenomas: Incidental finding on CT imaging.  Lung nodules seen on CT imaging: Multiple lung nodules CT on CT imaging, largest in the right lower lobe with mean diameter 7 mm.  Noncontrast chest CT at 3-6 months is recommended.  Findings discussed with patient.   DVT prophylaxis: CW:CBJSEGB  Code Status: DNR  Family Communication:  Level of care: Med-Surg Dispo:   The patient is from: home Anticipated d/c is to: home Anticipated d/c date is: 1-2 days Patient currently is not medically ready to d/c due to: transitioning to subQ insulin   Subjective and Interval History:  Gap closed and pt was transitioned N/V and abdominal pain resolved.  Pt able to eat.   Objective: Vitals:   04/19/21 0900 04/19/21 1200 04/19/21 1500 04/19/21 1632  BP: (!) 148/79 127/78 135/66 132/71  Pulse: (!) 109 (!) 104 99 97  Resp: 15 17  20   Temp:    98.9 F (37.2 C)  TempSrc:      SpO2: 100% 100% 100% 97%  Weight:      Height:        Intake/Output Summary (Last 24 hours) at 04/19/2021 1941 Last data filed  at 04/19/2021 1848 Gross per 24 hour  Intake 3903.66 ml  Output 1600 ml  Net 2303.66 ml   Filed Weights   04/18/21 1529  Weight: 68 kg    Examination:   Constitutional: NAD, AAOx3 HEENT: conjunctivae and lids normal, EOMI CV: No cyanosis.   RESP: normal respiratory effort, on RA Neuro: II - XII grossly intact.   Psych: Normal mood and affect.  Appropriate judgement and reason   Data Reviewed: I have personally  reviewed following labs and imaging studies  CBC: Recent Labs  Lab 04/18/21 1710  WBC 19.8*  NEUTROABS 18.8*  HGB 13.6  HCT 42.9  MCV 102.6*  PLT 366   Basic Metabolic Panel: Recent Labs  Lab 04/18/21 1710 04/18/21 2357 04/19/21 0709  NA 132* 135 139  K 5.5* 4.1 4.5  CL 94* 104 104  CO2 10* 20* 26  GLUCOSE 554* 199* 141*  BUN 36* 27* 23  CREATININE 1.28* 0.91 0.74  CALCIUM 9.6 8.5* 8.9  MG 2.9*  --  2.4  PHOS  --   --  3.8   GFR: Estimated Creatinine Clearance: 66.8 mL/min (by C-G formula based on SCr of 0.74 mg/dL). Liver Function Tests: Recent Labs  Lab 04/18/21 1710  AST 21  ALT 20  ALKPHOS 77  BILITOT 1.9*  PROT 8.0  ALBUMIN 3.9   Recent Labs  Lab 04/18/21 1710  LIPASE 25   No results for input(s): AMMONIA in the last 168 hours. Coagulation Profile: No results for input(s): INR, PROTIME in the last 168 hours. Cardiac Enzymes: No results for input(s): CKTOTAL, CKMB, CKMBINDEX, TROPONINI in the last 168 hours. BNP (last 3 results) No results for input(s): PROBNP in the last 8760 hours. HbA1C: No results for input(s): HGBA1C in the last 72 hours. CBG: Recent Labs  Lab 04/19/21 0401 04/19/21 0539 04/19/21 0749 04/19/21 1315 04/19/21 1625  GLUCAP 180* 146* 127* 204* 238*   Lipid Profile: No results for input(s): CHOL, HDL, LDLCALC, TRIG, CHOLHDL, LDLDIRECT in the last 72 hours. Thyroid Function Tests: No results for input(s): TSH, T4TOTAL, FREET4, T3FREE, THYROIDAB in the last 72 hours. Anemia Panel: No results for input(s): VITAMINB12, FOLATE, FERRITIN, TIBC, IRON, RETICCTPCT in the last 72 hours. Sepsis Labs: Recent Labs  Lab 04/18/21 1759 04/19/21 0709  LATICACIDVEN 4.1* 1.4    Recent Results (from the past 240 hour(s))  Resp Panel by RT-PCR (Flu A&B, Covid) Nasopharyngeal Swab     Status: None   Collection Time: 04/18/21  3:34 PM   Specimen: Nasopharyngeal Swab; Nasopharyngeal(NP) swabs in vial transport medium  Result Value Ref  Range Status   SARS Coronavirus 2 by RT PCR NEGATIVE NEGATIVE Final    Comment: (NOTE) SARS-CoV-2 target nucleic acids are NOT DETECTED.  The SARS-CoV-2 RNA is generally detectable in upper respiratory specimens during the acute phase of infection. The lowest concentration of SARS-CoV-2 viral copies this assay can detect is 138 copies/mL. A negative result does not preclude SARS-Cov-2 infection and should not be used as the sole basis for treatment or other patient management decisions. A negative result may occur with  improper specimen collection/handling, submission of specimen other than nasopharyngeal swab, presence of viral mutation(s) within the areas targeted by this assay, and inadequate number of viral copies(<138 copies/mL). A negative result must be combined with clinical observations, patient history, and epidemiological information. The expected result is Negative.  Fact Sheet for Patients:  EntrepreneurPulse.com.au  Fact Sheet for Healthcare Providers:  IncredibleEmployment.be  This test is no t  yet approved or cleared by the [place] and  has been authorized for detection and/or diagnosis of SARS-CoV-2 by FDA under an Emergency Use Authorization (EUA). This EUA will remain  in effect (meaning this test can be used) for the duration of the COVID-19 declaration under Section 564(b)(1) of the Act, 21 U.S.C.section 360bbb-3(b)(1), unless the authorization is terminated  or revoked sooner.       Influenza A by PCR NEGATIVE NEGATIVE Final   Influenza B by PCR NEGATIVE NEGATIVE Final    Comment: (NOTE) The Xpert Xpress SARS-CoV-2/FLU/RSV plus assay is intended as an aid in the diagnosis of influenza from Nasopharyngeal swab specimens and should not be used as a sole basis for treatment. Nasal washings and aspirates are unacceptable for Xpert Xpress SARS-CoV-2/FLU/RSV testing.  Fact Sheet for  Patients: EntrepreneurPulse.com.au  Fact Sheet for Healthcare Providers: IncredibleEmployment.be  This test is not yet approved or cleared by the [place] FDA and has been authorized for detection and/or diagnosis of SARS-CoV-2 by FDA under an Emergency Use Authorization (EUA). This EUA will remain in effect (meaning this test can be used) for the duration of the COVID-19 declaration under Section 564(b)(1) of the Act, 21 U.S.C. section 360bbb-3(b)(1), unless the authorization is terminated or revoked.  Performed at [facility], [address] [place], [place] [address]   Blood culture (routine x 2)     Status: None (Preliminary result)   Collection Time: 04/18/21  5:59 PM   Specimen: BLOOD  Result Value Ref Range Status   Specimen Description BLOOD BLOOD LEFT HAND  Final   Special Requests   Final    BOTTLES DRAWN AEROBIC ONLY Blood Culture adequate volume   Culture   Final    NO GROWTH < 24 HOURS Performed at [facility], [address], [place], [place] [address]    Report Status PENDING  Incomplete  Blood culture (routine x 2)     Status: None (Preliminary result)   Collection Time: 04/18/21  7:25 PM   Specimen: BLOOD  Result Value Ref Range Status   Specimen Description BLOOD BLOOD LEFT HAND  Final   Special Requests   Final    BOTTLES DRAWN AEROBIC ONLY Blood Culture results may not be optimal due to an inadequate volume of blood received in culture bottles   Culture   Final    NO GROWTH < 12 HOURS Performed at [facility], [address], [place], [place] [address]    Report Status PENDING  Incomplete      Radiology Studies: DG Chest Portable 1 View  Result Date: 04/18/2021 CLINICAL DATA:  Chest pain EXAM: PORTABLE CHEST 1 VIEW COMPARISON:  Chest radiograph 02/10/2021 FINDINGS: The cardiomediastinal silhouette is within normal limits. There is no focal consolidation or pulmonary edema.  There is no pleural effusion or pneumothorax. A prior displaced left clavicle fracture is again seen. IMPRESSION: No radiographic evidence of acute cardiopulmonary process. Electronically Signed   By: Valetta Mole M.D.   On: 04/18/2021 16:14   CT Angio Chest/Abd/Pel for Dissection W and/or Wo Contrast  Result Date: 04/18/2021 CLINICAL DATA:  Midsternal chest pain with vomiting. EXAM: CT ANGIOGRAPHY CHEST, ABDOMEN AND PELVIS TECHNIQUE: Non-contrast CT of the chest was initially obtained. Multidetector CT imaging through the chest, abdomen and pelvis was performed using the standard protocol during bolus administration of intravenous contrast. Multiplanar reconstructed images and MIPs were obtained and reviewed to evaluate the vascular anatomy. CONTRAST:  163mL OMNIPAQUE IOHEXOL 350 MG/ML  SOLN COMPARISON:  CT AP 06/07/2016 FINDINGS: CTA CHEST FINDINGS Cardiovascular: Preferential opacification of the thoracic aorta. No evidence of thoracic aortic aneurysm or dissection. Normal heart size. No pericardial effusion. Mild aortic atherosclerosis and coronary artery calcifications. Mediastinum/Nodes: No enlarged mediastinal, hilar, or axillary lymph nodes. Thyroid gland, trachea, and esophagus demonstrate no significant findings. Lungs/Pleura: No pleural effusion, airspace consolidation, atelectasis, or pneumothorax. Mild emphysema with diffuse bronchial wall thickening. Left upper lobe lung nodule measures 3 mm, image 54/6. Right upper lobe nodule measures 3 mm, image 50/6. Subpleural nodule within the superior segment of right lower lobe abutting the major fissure measures 0.4 x 1.0 cm, image 86/6 (mean diameter 7 mm), image 86/6. Musculoskeletal: No chest wall abnormality. No acute or significant osseous findings. Remote healed left third rib fracture, image 62/6. No acute or suspicious osseous findings. Review of the MIP images confirms the above findings. CTA ABDOMEN AND PELVIS FINDINGS VASCULAR Aorta: Normal  caliber aorta without aneurysm, dissection, vasculitis or significant stenosis. Aortic atherosclerosis. Celiac: Patent without evidence of aneurysm, dissection, vasculitis or significant stenosis. SMA: Patent without evidence of aneurysm, dissection, vasculitis or significant stenosis. Renals: Both renal arteries are patent without evidence of aneurysm, dissection, vasculitis, fibromuscular dysplasia or significant stenosis. IMA: Patent without evidence of aneurysm, dissection, vasculitis or significant stenosis. Inflow: Patent without evidence of aneurysm, dissection, vasculitis or significant stenosis. Veins: No obvious venous abnormality within the limitations of this arterial phase study. Review of the MIP images confirms the above findings. NON-VASCULAR Hepatobiliary: Diffuse hepatic steatosis. Pancreas: Unremarkable. No pancreatic ductal dilatation or surrounding inflammatory changes. Spleen: Normal in size without focal abnormality. Adrenals/Urinary Tract: Low-attenuation left adrenal nodule likely represents a benign adenoma measuring 1.3 x 0.7 cm, image 98/5. Similarly, right adrenal nodule also is low attenuation measuring 1.2 x 1.9 cm and likely represents a benign adenoma. No kidney mass or signs of hydronephrosis. No hydroureter or ureteral lithiasis. Urinary bladder unremarkable. Stomach/Bowel: Stomach is within normal limits. Appendix appears normal. No evidence of bowel wall thickening, distention, or inflammatory changes. Lymphatic: No abdominopelvic adenopathy Reproductive: Uterus and bilateral adnexa are unremarkable. Other: No free fluid or fluid collections.  No pneumoperitoneum Musculoskeletal: No acute findings. Chronic appearing mild superior endplate deformity is noted at the L1 level. No suspicious bone lesions. Review of the MIP images confirms the above findings. IMPRESSION: 1. No evidence for aortic dissection or aneurysm. 2. Emphysema and diffuse bronchial wall thickening with  emphysema, as above; imaging findings suggestive of underlying COPD. 3. Hepatic steatosis. 4. Bilateral adrenal gland adenomas. 5. Lung nodules are identified. The largest is in the right lower lobe with a mean diameter of 7 mm. Non-contrast chest CT at 3-6 months is recommended. This recommendation follows the consensus statement: Guidelines for Management of Incidental Pulmonary Nodules Detected on CT Images:From the Fleischner Society 2017; published online before print (10.1148/radiol.[id number]). 6. Aortic Atherosclerosis (ICD10-I70.0) and Emphysema (ICD10-J43.9). 7. Coronary artery calcifications. Electronically Signed   By: Kerby Moors M.D.   On: 04/18/2021 16:50     Scheduled Meds:  diltiazem  240 mg Oral Daily   FLUoxetine  40 mg Oral Daily   folic acid  1 mg Oral Daily   [START ON 04/20/2021] influenza vac split quadrivalent PF  0.5 mL Intramuscular Tomorrow-1000   insulin aspart  0-20 Units Subcutaneous TID AC & HS   insulin glargine-yfgn  10 Units Subcutaneous Q24H   multivitamin with minerals  1 tablet Oral Daily   nicotine  21 mg Transdermal Daily   pantoprazole  40 mg Oral Daily   rivaroxaban  20 mg Oral Q supper   sodium chloride flush  10-40 mL Intracatheter Q12H   thiamine  100 mg Oral Daily   Or   thiamine  100 mg Intravenous Daily   Continuous Infusions:   LOS: 1 day     Enzo Bi, MD [facility] If 7PM-7AM, please contact night-coverage 04/19/2021, 7:41 PM

## 2021-04-19 NOTE — Plan of Care (Signed)

## 2021-04-20 LAB — BASIC METABOLIC PANEL
Anion gap: 11 (ref 5–15)
BUN: 14 mg/dL (ref 8–23)
CO2: 27 mmol/L (ref 22–32)
Calcium: 9.1 mg/dL (ref 8.9–10.3)
Chloride: 99 mmol/L (ref 98–111)
Creatinine, Ser: 0.73 mg/dL (ref 0.44–1.00)
GFR, Estimated: >60 mL/min (ref >60)
Glucose, Bld: 161 mg/dL — ABNORMAL HIGH (ref 70–99)
Potassium: 4.1 mmol/L (ref 3.5–5.1)
Sodium: 137 mmol/L (ref 135–145)

## 2021-04-20 LAB — CBC
HCT: 41.7 % (ref 36.0–46.0)
Hemoglobin: 13.8 g/dL (ref 12.0–15.0)
MCH: 32.1 pg (ref 26.0–34.0)
MCHC: 33.1 g/dL (ref 30.0–36.0)
MCV: 97 fL (ref 80.0–100.0)
Platelets: 305 10*3/uL (ref 150–400)
RBC: 4.3 MIL/uL (ref 3.87–5.11)
RDW: 14.3 % (ref 11.5–15.5)
WBC: 12.8 10*3/uL — ABNORMAL HIGH (ref 4.0–10.5)
nRBC: 0 % (ref 0.0–0.2)

## 2021-04-20 LAB — GLUCOSE, CAPILLARY
Glucose-Capillary: 131 mg/dL — ABNORMAL HIGH (ref 70–99)
Glucose-Capillary: 208 mg/dL — ABNORMAL HIGH (ref 70–99)
Glucose-Capillary: 211 mg/dL — ABNORMAL HIGH (ref 70–99)

## 2021-04-20 LAB — MAGNESIUM: Magnesium: 2.1 mg/dL (ref 1.7–2.4)

## 2021-04-20 MED ORDER — INSULIN GLARGINE-YFGN 100 UNIT/ML ~~LOC~~ SOLN
20.0000 [IU] | Freq: Every day | SUBCUTANEOUS | Status: DC
  Filled 2021-04-20: qty 0.2

## 2021-04-20 MED ORDER — INSULIN ASPART 100 UNIT/ML ~~LOC~~ SOLN: 4.0000 [IU] | Freq: Three times a day (TID) | SUBCUTANEOUS | 0 refills | Status: DC

## 2021-04-20 MED ORDER — INSULIN GLARGINE-YFGN 100 UNIT/ML ~~LOC~~ SOLN
10.0000 [IU] | Freq: Once | SUBCUTANEOUS | Status: AC
  Administered 2021-04-20: 10 [IU] via SUBCUTANEOUS
  Filled 2021-04-20: qty 0.1

## 2021-04-20 MED ORDER — NICOTINE 21 MG/24HR TD PT24: 21.0000 mg | MEDICATED_PATCH | Freq: Every day | TRANSDERMAL | 0 refills | Status: DC

## 2021-04-20 MED ORDER — LANTUS SOLOSTAR 100 UNIT/ML ~~LOC~~ SOPN: 37.0000 [IU] | PEN_INJECTOR | Freq: Every day | SUBCUTANEOUS | 11 refills | Status: DC

## 2021-04-20 NOTE — Plan of Care (Signed)

## 2021-04-20 NOTE — Discharge Summary (Addendum)
Physician Discharge Summary   Michaela Johnson  female DOB: [birth year]  KNL:[id number]  PCP: [name], NP  Admit date: 04/18/2021 Discharge date: 04/20/2021  Admitted From: home Disposition:  home CODE STATUS: DNR  Discharge Instructions     Discharge instructions   Complete by: As directed    You presented with DKA (diabetic ketoacidosis).  Please continue to take your long-acting insulin as well as short-acting insulin with each meal.  Please stop taking Jardiance.     Dr. Enzo Bi [facility] Course:  For full details, please see H&P, progress notes, consult notes and ancillary notes.  Briefly,  Michaela Johnson is a 60 y.o. female with medical history significant for paroxysmal atrial fibrillation on Xarelto, insulin-dependent type 2 diabetes, COPD, hypertension, LBBB, alcohol and tobacco use who presented to the ED for evaluation of nausea and vomiting.    Patient states she was recently started on Jardiance for her diabetes.  She states that she is also taking Ozempic once weekly. She says she is also using Lantus and NovoLog but cannot tell me how many units of each she uses.   Diabetic ketoacidosis in insulin-dependent type 2 diabetes: Presenting with serum glucose 534, anion gap 28, beta hydroxybutyrate >8, ketonuria, and pH 7.18 on vBG consistent with DKA.  She says she was recently started on Jardiance, which may not be a good long-term option for her given her ketosis-prone diabetes.  Last hemoglobin A1c 10.3% on 02/09/2021, current A1c 7.9. --gap closed, bicarb normalized, insulin gtt transitioned to glargine early next morning --Pt was discharged back to home insulin regimen (see below).  Home Jardiance d/c'ed.   Acute kidney injury: --Cr 1.28 on presentation.  Prerenal secondary to dehydration in setting of DKA.  --resolved after IVF.  Cr 0.73 prior to discharge.   Hyperkalemia: In setting of AKI/DKA.  resolved with fluids and IV insulin as above.    Leukocytosis: No obvious ongoing infection, likely reactive.  Did not start abx.   Paroxysmal atrial fibrillation: Tachycardic due to dehydration but in sinus rhythm on admission.   --cont cardizem --cont Xarelto   Hypertension: BP on softer side on admission.  Per med rec, pt was not taking amlodipine-benazepril at home, but takes Lisinopril. --cont Cardizem --Home Lisinopril held in the setting of AKI and soft BP, and resumed after discharge.   COPD with ongoing tobacco use: Stable without acute exacerbation.   Smoking cessation advised.   Continue albuterol as needed.  cont nicotine patch.   Alcohol use: Serum ethanol undetectable on admission.  No sign of withdrawal.     Depression/anxiety: Continue fluoxetine.   Bilateral adrenal gland adenomas: Incidental finding on CT imaging.  Lung nodules seen on CT imaging: Multiple lung nodules CT on CT imaging, largest in the right lower lobe with mean diameter 7 mm.  Noncontrast chest CT at 3-6 months is recommended.  Findings discussed with patient by admitting physician.   Discharge Diagnoses:  Principal Problem:   Diabetic ketoacidosis associated with type 2 diabetes mellitus ([place]) Active Problems:   DKA (diabetic ketoacidosis) ([facility])   PAF (paroxysmal atrial fibrillation) ([place])   Hypertension associated with diabetes ([place])   Alcohol use disorder, moderate, dependence ([place])   AKI (acute kidney injury) ([place])   Hyperkalemia   Multiple lung nodules on CT   30 Day Unplanned Readmission Risk Score    Flowsheet Row ED to Hosp-Admission (Current) from 04/18/2021 in [place] (1C)  30 Day Unplanned Readmission Risk Score (%) 23.38 Filed at 04/20/2021 1200       This score is the patient's risk of an unplanned readmission within 30 days of being discharged (0 -100%). The score is based on dignosis, age, lab data, medications, orders, and past utilization.   Low:  0-14.9   Medium: 15-21.9    High: 22-29.9   Extreme: 30 and above         Discharge Instructions:  Allergies as of 04/20/2021       Reactions   Codeine Itching   Levaquin [levofloxacin In D5w] Itching        Medication List     STOP taking these medications    amLODipine-benazepril 5-40 MG capsule Commonly known as: LOTREL   Jardiance 10 MG Tabs tablet Generic drug: empagliflozin       TAKE these medications    acetaminophen 325 MG tablet Commonly known as: TYLENOL TAKE 1-2 TABLETS (325-650 MG TOTAL) BY MOUTH EVERY 6 (SIX) HOURS AS NEEDED FOR MILD PAIN (PAIN SCORE 1-3 OR TEMP > 100.5).   albuterol 108 (90 Base) MCG/ACT inhaler Commonly known as: VENTOLIN HFA Inhale 2 puffs into the lungs every 6 (six) hours as needed for wheezing or shortness of breath.   CertaVite/Antioxidants Tabs TAKE 1 TABLET BY MOUTH DAILY   diltiazem 240 MG 24 hr capsule Commonly known as: CARDIZEM CD Take 1 capsule by mouth once daily   diphenhydrAMINE 12.5 MG/5ML liquid Commonly known as: BENADRYL Take 25 mg by mouth 4 (four) times daily as needed for itching.   FLUoxetine 40 MG capsule Commonly known as: PROZAC Take 40 mg by mouth daily.   gabapentin 300 MG capsule Commonly known as: NEURONTIN Take 600 mg by mouth at bedtime as needed (Neuropathy).   insulin aspart 100 UNIT/ML injection Commonly known as: novoLOG Inject 4 Units into the skin 3 (three) times daily with meals. Short-acting insulin What changed: additional instructions   Lantus SoloStar 100 UNIT/ML Solostar Pen Generic drug: insulin glargine Inject 37 Units into the skin daily. This is your home dose of long-acting insulin. What changed:  how much to take when to take this additional instructions   lisinopril 40 MG tablet Commonly known as: ZESTRIL Take 40 mg by mouth daily.   nicotine 21 mg/24hr patch Commonly known as: NICODERM CQ - dosed in mg/24 hours Place 1 patch (21 mg total) onto the skin daily. Start taking on:  April 21, 2021   omeprazole 20 MG capsule Commonly known as: PRILOSEC Take 20 mg by mouth daily.   Ozempic (0.25 or 0.5 MG/DOSE) 2 MG/1.5ML Sopn Generic drug: Semaglutide(0.25 or 0.5MG /DOS) Inject 0.25 mg into the skin every 7 (seven) days.   rivaroxaban 20 MG Tabs tablet Commonly known as: XARELTO Take 1 tablet (20 mg total) by mouth daily with supper. What changed: when to take this   vitamin C 500 MG tablet Commonly known as: ASCORBIC ACID TAKE 1 TABLET (500 MG TOTAL) BY MOUTH DAILY.   Vitamin D 125 MCG (5000 UT) Caps Take 1 capsule by mouth daily.         Follow-up Information     [name], NP Follow up in 1 week(s).   Specialty: Nurse Practitioner Contact information: [place] [address] [phone]                 Allergies  Allergen Reactions   Codeine Itching   Levaquin [Levofloxacin In D5w] Itching  The results of significant diagnostics from this hospitalization (including imaging, microbiology, ancillary and laboratory) are listed below for reference.   Consultations:   Procedures/Studies: DG Chest Portable 1 View  Result Date: 04/18/2021 CLINICAL DATA:  Chest pain EXAM: PORTABLE CHEST 1 VIEW COMPARISON:  Chest radiograph 02/10/2021 FINDINGS: The cardiomediastinal silhouette is within normal limits. There is no focal consolidation or pulmonary edema. There is no pleural effusion or pneumothorax. A prior displaced left clavicle fracture is again seen. IMPRESSION: No radiographic evidence of acute cardiopulmonary process. Electronically Signed   By: Valetta Mole M.D.   On: 04/18/2021 16:14   CT Angio Chest/Abd/Pel for Dissection W and/or Wo Contrast  Result Date: 04/18/2021 CLINICAL DATA:  Midsternal chest pain with vomiting. EXAM: CT ANGIOGRAPHY CHEST, ABDOMEN AND PELVIS TECHNIQUE: Non-contrast CT of the chest was initially obtained. Multidetector CT imaging through the chest, abdomen and pelvis was  performed using the standard protocol during bolus administration of intravenous contrast. Multiplanar reconstructed images and MIPs were obtained and reviewed to evaluate the vascular anatomy. CONTRAST:  175mL OMNIPAQUE IOHEXOL 350 MG/ML SOLN COMPARISON:  CT AP 06/07/2016 FINDINGS: CTA CHEST FINDINGS Cardiovascular: Preferential opacification of the thoracic aorta. No evidence of thoracic aortic aneurysm or dissection. Normal heart size. No pericardial effusion. Mild aortic atherosclerosis and coronary artery calcifications. Mediastinum/Nodes: No enlarged mediastinal, hilar, or axillary lymph nodes. Thyroid gland, trachea, and esophagus demonstrate no significant findings. Lungs/Pleura: No pleural effusion, airspace consolidation, atelectasis, or pneumothorax. Mild emphysema with diffuse bronchial wall thickening. Left upper lobe lung nodule measures 3 mm, image 54/6. Right upper lobe nodule measures 3 mm, image 50/6. Subpleural nodule within the superior segment of right lower lobe abutting the major fissure measures 0.4 x 1.0 cm, image 86/6 (mean diameter 7 mm), image 86/6. Musculoskeletal: No chest wall abnormality. No acute or significant osseous findings. Remote healed left third rib fracture, image 62/6. No acute or suspicious osseous findings. Review of the MIP images confirms the above findings. CTA ABDOMEN AND PELVIS FINDINGS VASCULAR Aorta: Normal caliber aorta without aneurysm, dissection, vasculitis or significant stenosis. Aortic atherosclerosis. Celiac: Patent without evidence of aneurysm, dissection, vasculitis or significant stenosis. SMA: Patent without evidence of aneurysm, dissection, vasculitis or significant stenosis. Renals: Both renal arteries are patent without evidence of aneurysm, dissection, vasculitis, fibromuscular dysplasia or significant stenosis. IMA: Patent without evidence of aneurysm, dissection, vasculitis or significant stenosis. Inflow: Patent without evidence of aneurysm,  dissection, vasculitis or significant stenosis. Veins: No obvious venous abnormality within the limitations of this arterial phase study. Review of the MIP images confirms the above findings. NON-VASCULAR Hepatobiliary: Diffuse hepatic steatosis. Pancreas: Unremarkable. No pancreatic ductal dilatation or surrounding inflammatory changes. Spleen: Normal in size without focal abnormality. Adrenals/Urinary Tract: Low-attenuation left adrenal nodule likely represents a benign adenoma measuring 1.3 x 0.7 cm, image 98/5. Similarly, right adrenal nodule also is low attenuation measuring 1.2 x 1.9 cm and likely represents a benign adenoma. No kidney mass or signs of hydronephrosis. No hydroureter or ureteral lithiasis. Urinary bladder unremarkable. Stomach/Bowel: Stomach is within normal limits. Appendix appears normal. No evidence of bowel wall thickening, distention, or inflammatory changes. Lymphatic: No abdominopelvic adenopathy Reproductive: Uterus and bilateral adnexa are unremarkable. Other: No free fluid or fluid collections.  No pneumoperitoneum Musculoskeletal: No acute findings. Chronic appearing mild superior endplate deformity is noted at the L1 level. No suspicious bone lesions. Review of the MIP images confirms the above findings. IMPRESSION: 1. No evidence for aortic dissection or aneurysm. 2. Emphysema and diffuse bronchial wall thickening with  emphysema, as above; imaging findings suggestive of underlying COPD. 3. Hepatic steatosis. 4. Bilateral adrenal gland adenomas. 5. Lung nodules are identified. The largest is in the right lower lobe with a mean diameter of 7 mm. Non-contrast chest CT at 3-6 months is recommended. This recommendation follows the consensus statement: Guidelines for Management of Incidental Pulmonary Nodules Detected on CT Images:From the Fleischner Society 2017; published online before print (10.1148/radiol.[id number]). 6. Aortic Atherosclerosis (ICD10-I70.0) and Emphysema  (ICD10-J43.9). 7. Coronary artery calcifications. Electronically Signed   By: Kerby Moors M.D.   On: 04/18/2021 16:50      Labs: BNP (last 3 results) No results for input(s): BNP in the last 8760 hours. Basic Metabolic Panel: Recent Labs  Lab 04/18/21 1710 04/18/21 2357 04/19/21 0709 04/20/21 0539  NA 132* 135 139 137  K 5.5* 4.1 4.5 4.1  CL 94* 104 104 99  CO2 10* 20* 26 27  GLUCOSE 554* 199* 141* 161*  BUN 36* 27* 23 14  CREATININE 1.28* 0.91 0.74 0.73  CALCIUM 9.6 8.5* 8.9 9.1  MG 2.9*  --  2.4 2.1  PHOS  --   --  3.8  --    Liver Function Tests: Recent Labs  Lab 04/18/21 1710  AST 21  ALT 20  ALKPHOS 77  BILITOT 1.9*  PROT 8.0  ALBUMIN 3.9   Recent Labs  Lab 04/18/21 1710  LIPASE 25   No results for input(s): AMMONIA in the last 168 hours. CBC: Recent Labs  Lab 04/18/21 1710 04/20/21 0539  WBC 19.8* 12.8*  NEUTROABS 18.8*  --   HGB 13.6 13.8  HCT 42.9 41.7  MCV 102.6* 97.0  PLT 340 305   Cardiac Enzymes: No results for input(s): CKTOTAL, CKMB, CKMBINDEX, TROPONINI in the last 168 hours. BNP: Invalid input(s): POCBNP CBG: Recent Labs  Lab 04/19/21 1625 04/19/21 2103 04/20/21 0419 04/20/21 0800 04/20/21 1134  GLUCAP 238* 138* 131* 208* 211*   D-Dimer No results for input(s): DDIMER in the last 72 hours. Hgb A1c No results for input(s): HGBA1C in the last 72 hours. Lipid Profile No results for input(s): CHOL, HDL, LDLCALC, TRIG, CHOLHDL, LDLDIRECT in the last 72 hours. Thyroid function studies No results for input(s): TSH, T4TOTAL, T3FREE, THYROIDAB in the last 72 hours.  Invalid input(s): FREET3 Anemia work up No results for input(s): VITAMINB12, FOLATE, FERRITIN, TIBC, IRON, RETICCTPCT in the last 72 hours. Urinalysis    Component Value Date/Time   COLORURINE YELLOW 04/18/2021 1825   APPEARANCEUR CLEAR 04/18/2021 1825   LABSPEC 1.010 04/18/2021 1825   PHURINE 5.5 04/18/2021 1825   GLUCOSEU >1,000 (A) 04/18/2021 1825   HGBUR  TRACE (A) 04/18/2021 1825   BILIRUBINUR NEGATIVE 04/18/2021 1825   KETONESUR >160 (A) 04/18/2021 1825   PROTEINUR NEGATIVE 04/18/2021 1825   NITRITE NEGATIVE 04/18/2021 1825   LEUKOCYTESUR NEGATIVE 04/18/2021 1825   Sepsis Labs Invalid input(s): PROCALCITONIN,  WBC,  LACTICIDVEN Microbiology Recent Results (from the past 240 hour(s))  Resp Panel by RT-PCR (Flu A&B, Covid) Nasopharyngeal Swab     Status: None   Collection Time: 04/18/21  3:34 PM   Specimen: Nasopharyngeal Swab; Nasopharyngeal(NP) swabs in vial transport medium  Result Value Ref Range Status   SARS Coronavirus 2 by RT PCR NEGATIVE NEGATIVE Final    Comment: (NOTE) SARS-CoV-2 target nucleic acids are NOT DETECTED.  The SARS-CoV-2 RNA is generally detectable in upper respiratory specimens during the acute phase of infection. The lowest concentration of SARS-CoV-2 viral copies this assay can detect is 138  copies/mL. A negative result does not preclude SARS-Cov-2 infection and should not be used as the sole basis for treatment or other patient management decisions. A negative result may occur with  improper specimen collection/handling, submission of specimen other than nasopharyngeal swab, presence of viral mutation(s) within the areas targeted by this assay, and inadequate number of viral copies(<138 copies/mL). A negative result must be combined with clinical observations, patient history, and epidemiological information. The expected result is Negative.  Fact Sheet for Patients:  EntrepreneurPulse.com.au  Fact Sheet for Healthcare Providers:  IncredibleEmployment.be  This test is no t yet approved or cleared by the [place] FDA and  has been authorized for detection and/or diagnosis of SARS-CoV-2 by FDA under an Emergency Use Authorization (EUA). This EUA will remain  in effect (meaning this test can be used) for the duration of the COVID-19 declaration under Section  564(b)(1) of the Act, 21 U.S.C.section 360bbb-3(b)(1), unless the authorization is terminated  or revoked sooner.       Influenza A by PCR NEGATIVE NEGATIVE Final   Influenza B by PCR NEGATIVE NEGATIVE Final    Comment: (NOTE) The Xpert Xpress SARS-CoV-2/FLU/RSV plus assay is intended as an aid in the diagnosis of influenza from Nasopharyngeal swab specimens and should not be used as a sole basis for treatment. Nasal washings and aspirates are unacceptable for Xpert Xpress SARS-CoV-2/FLU/RSV testing.  Fact Sheet for Patients: EntrepreneurPulse.com.au  Fact Sheet for Healthcare Providers: IncredibleEmployment.be  This test is not yet approved or cleared by the [place] FDA and has been authorized for detection and/or diagnosis of SARS-CoV-2 by FDA under an Emergency Use Authorization (EUA). This EUA will remain in effect (meaning this test can be used) for the duration of the COVID-19 declaration under Section 564(b)(1) of the Act, 21 U.S.C. section 360bbb-3(b)(1), unless the authorization is terminated or revoked.  Performed at [facility], [address] [place], [place] [address]   Blood culture (routine x 2)     Status: None (Preliminary result)   Collection Time: 04/18/21  5:59 PM   Specimen: BLOOD  Result Value Ref Range Status   Specimen Description BLOOD BLOOD LEFT HAND  Final   Special Requests   Final    BOTTLES DRAWN AEROBIC ONLY Blood Culture adequate volume   Culture   Final    NO GROWTH 2 DAYS Performed at [facility], [address] [place], [place] [address]    Report Status PENDING  Incomplete  Urine Culture     Status: None   Collection Time: 04/18/21  6:25 PM   Specimen: Urine, Random  Result Value Ref Range Status   Specimen Description   Final    URINE, RANDOM Performed at [facility], [address] [place], [place] [address]    Special Requests   Final     NONE Performed at [facility], [address], [place], [place] [address]    Culture   Final    NO GROWTH Performed at [facility], [place] [address] [place], [place] [address]    Report Status 04/19/2021 FINAL  Final  Blood culture (routine x 2)     Status: None (Preliminary result)   Collection Time: 04/18/21  7:25 PM   Specimen: BLOOD  Result Value Ref Range Status   Specimen Description BLOOD BLOOD LEFT HAND  Final   Special Requests   Final    BOTTLES DRAWN AEROBIC ONLY Blood Culture results may not be optimal due to  an inadequate volume of blood received in culture bottles   Culture   Final    NO GROWTH 2 DAYS Performed at [facility], [address] [place], [place] [address]    Report Status PENDING  Incomplete     Total time spend on discharging this patient, including the last patient exam, discussing the hospital stay, instructions for ongoing care as it relates to all pertinent caregivers, as well as preparing the medical discharge records, prescriptions, and/or referrals as applicable, is 35 minutes.    Enzo Bi, MD  [facility] 04/20/2021, 2:38 PM

## 2021-04-20 NOTE — Progress Notes (Signed)
[facility] [employer] called the floor to advise that he was at the [place] entrance to pick the pt up for discharge; pt discharged via wheelchair by nursing to the [place] entrance; taxi voucher given to the driver by nursing

## 2021-04-20 NOTE — Progress Notes (Signed)
MD order received in [facility] to discharge pt home today; verbally reviewed AVS with pt, Rxs escribed to the [place] on [address] in [place], [place]; no questions voiced by the pt at this time; pt's discharge pending arrival of [place] [employer] service at the [place] entrance to take her home

## 2021-04-22 LAB — HEMOGLOBIN A1C
Hgb A1c MFr Bld: 7.9 % — ABNORMAL HIGH (ref 4.8–5.6)
Mean Plasma Glucose: 180 mg/dL

## 2021-04-23 LAB — CULTURE, BLOOD (ROUTINE X 2)
Culture: NO GROWTH
Culture: NO GROWTH
Special Requests: ADEQUATE

## 2021-04-24 ENCOUNTER — Encounter: Payer: Self-pay | Admitting: Cardiovascular Disease

## 2021-04-24 ENCOUNTER — Telehealth: Payer: Self-pay | Admitting: Cardiovascular Disease

## 2021-04-24 ENCOUNTER — Other Ambulatory Visit: Payer: Self-pay

## 2021-04-24 ENCOUNTER — Ambulatory Visit: Payer: Self-pay | Admitting: Cardiovascular Disease

## 2021-04-24 VITALS — BP 102/52 | HR 85 | Ht 62.0 in | Wt 138.6 lb

## 2021-04-24 DIAGNOSIS — Z8719 Personal history of other diseases of the digestive system: Secondary | ICD-10-CM

## 2021-04-24 DIAGNOSIS — I480 Paroxysmal atrial fibrillation: Secondary | ICD-10-CM

## 2021-04-24 DIAGNOSIS — R55 Syncope and collapse: Secondary | ICD-10-CM

## 2021-04-24 DIAGNOSIS — Z720 Tobacco use: Secondary | ICD-10-CM

## 2021-04-24 DIAGNOSIS — I10 Essential (primary) hypertension: Secondary | ICD-10-CM

## 2021-04-24 MED ORDER — LISINOPRIL 40 MG PO TABS: 40.0000 mg | ORAL_TABLET | Freq: Every day | ORAL | 3 refills | Status: DC

## 2021-04-24 MED ORDER — DILTIAZEM HCL ER COATED BEADS 240 MG PO CP24: 240.0000 mg | ORAL_CAPSULE | Freq: Every day | ORAL | 3 refills | Status: DC

## 2021-04-24 MED ORDER — LISINOPRIL 20 MG PO TABS: 20.0000 mg | ORAL_TABLET | Freq: Every day | ORAL | 3 refills | Status: DC

## 2021-04-24 MED ORDER — DILTIAZEM HCL ER COATED BEADS 180 MG PO CP24: 180.0000 mg | ORAL_CAPSULE | Freq: Every day | ORAL | 3 refills | Status: DC

## 2021-04-24 NOTE — Addendum Note (Signed)
Addended by: Wynema Birch on: 04/24/2021 01:35 PM   Modules accepted: Orders

## 2021-04-24 NOTE — Patient Instructions (Addendum)
Medication Instructions:   Please go home and review your current medications STOP the amlodipine-benazepril If you are not on this pill, Call the office We may need to decrease the dose of the either the diltiazem or the lisinopril for dizziness (blood pressure low)  If you need a refill on your cardiac medications before your next appointment, please call your pharmacy.   Lab work: No new labs needed  Testing/Procedures: No new testing needed  Follow-Up: At [facility], you and your health needs are our priority.  As part of our continuing mission to provide you with exceptional heart care, we have created designated Provider Care Teams.  These Care Teams include your primary Cardiologist (physician) and Advanced Practice Providers (APPs -  Physician Assistants and Nurse Practitioners) who all work together to provide you with the care you need, when you need it.  You will need a follow up appointment in 12 months  Providers on your designated Care Team:   Murray Hodgkins, NP Christell Faith, PA-C Marrianne Mood, PA-C Cadence [place], [place]  COVID-19 Vaccine Information can be found at: ShippingScam.co.uk For questions related to vaccine distribution or appointments, please email [email or web address] or call [phone].

## 2021-04-24 NOTE — Progress Notes (Signed)
Date:  04/24/2021   ID:  [name], DOB [birth year], MRN [id number]  Patient Location:  [address] [place] [address]   Provider location:   [facility], [place] office  PCP:  [name], NP  Cardiologist:  Madason Rauls, [place]  Cc: dizziness, weight loss, low BP  History of Present Illness:    Michaela Johnson is a 60 y.o. female past medical history of paroxysmal atrial fibrillation, August 2019 insulin-dependent diabetes,  prior history of DKA,  sunstance abuse including cocaine, alcohol abuse abuse neuropathy,   [facility] 01/2018 with DKA, nausea vomiting potassium 7.1 sodium level 123 Syncope in the setting of vasovagal episodes Who presents to follow-up with her atrial fibrillation, syncope/vasovagal episodes  Last seen in clinic in person by myself August 2019 Seen by one of our providers September 2021  in the hospital end of September into beginning of October 2022 for nausea and vomiting.  serum glucose 534, anion gap 28, beta hydroxybutyrate >8, ketonuria, and pH 7.18 on vBG consistent with DKA.  -On discussion today she is unsure how her sugars got so high  Over the past several months, reports she has Lost weight 150 to 138 pounds Most notably in the past 3 weeks  Reports having orthostasis symptoms the past 2 weeks at least Has to stand in place close her eyes breathe until dizziness goes away Typically happens going from sitting to standing position  Not sure what medications she is taking Told to stop amlodipine benazepril on d/c paperwork from the hospital Thinks she is taking it? Also on diltiazem 240/lisinopril 40 Did not bring a list with her today  Denies chest pain on exertion Reports previously with chest discomfort in the setting of vomiting prior to admission  A1c 7.9  EKG personally reviewed by myself on todays visit Normal sinus rhythm rate 85 bpm PVCs left bundle branch block No change from prior  EKG  Other past medical history reviewed 01/2019 seen in the ER  diarrhea, bleeding, weak Syncope sitting on the commode [place] bathroom, wrist injury On eliquis  7/12 to 7./14 emesis, hyperkalemia,DKA, atrial fibrillation 7/17 to 7/28  Weakness emesis, hyperkalemia,DKA 8/4 to 8/5, PAF, emisis, hyperkalemia,DKA, treated with amiodaronetreated with amiodarone, eliquis   In follow-up today she reports having diarrhea one week ago, She thinks it was from drinking a Glucerna Felt dizzy while having the diarrhea in the bathroom, developed syncope Did not go to the hospital Hit the side of her face on the left    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 07/30/2020   Constipation    Depression    Diabetes mellitus without complication (HCC)    Dysrhythmia     PAF   GERD (gastroesophageal reflux disease)    Hypertension    Neuropathy    Nicotine dependence 07/31/2020   PAF (paroxysmal atrial fibrillation) ([place])    Panic attacks    Pneumonia    Tobacco use    Vitamin D deficiency    Past Surgical History:  Procedure Laterality Date   ANKLE FUSION Right 07/30/2020   Procedure: ANKLE FUSION;  Surgeon: Altamese Rough Rock, MD;  Location: [facility];  Service: Orthopedics;  Laterality: Right;   APPLICATION OF WOUND VAAPPLICATION OF WOUND  VAC (Right Ankle)C (Right Ankle)  69/67/8938   APPLICATION OF WOUND VAC Right 07/30/2020   Procedure: APPLICATION OF WOUND VAC;  Surgeon: Altamese Myrtle, MD;  Location: [facility];  Service: Orthopedics;  Laterality: Right;   HARDWARE REMOVAL Right 07/30/2020   Procedure: HARDWARE REMOVAL RIGHT ANKLE;  Surgeon: Altamese , MD;  Location: [facility];  Service: Orthopedics;  Laterality: Right;   HARDWARE REMOVAL RIGHT ANKLE (Right Ankle  07/31/2020   ORIF ANKLE FRACTURE Right 07/04/2020    Procedure: OPEN REDUCTION INTERNAL FIXATION (ORIF) ANKLE FRACTURE;  Surgeon: Lovell Sheehan, MD;  Location: [facility];  Service: Orthopedics;  Laterality: Right;     Allergies:   Codeine and Levaquin [levofloxacin in d5w]   Social History   Tobacco Use   Smoking status: Some Days    Packs/day: 1.00    Years: 41.00    Pack years: 41.00    Types: Cigarettes   Smokeless tobacco: Never  Vaping Use   Vaping Use: Never used  Substance Use Topics   Alcohol use: Yes    Alcohol/week: 6.0 standard drinks    Types: 6 Standard drinks or equivalent per week   Drug use: Yes    Frequency: 7.0 times per week    Types: Marijuana      Outpatient Encounter Medications as of 04/24/2021  Medication Sig   acetaminophen (TYLENOL) 325 MG tablet TAKE 1-2 TABLETS (325-650 MG TOTAL) BY MOUTH EVERY 6 (SIX) HOURS AS NEEDED FOR MILD PAIN (PAIN SCORE 1-3 OR TEMP > 100.5).   albuterol (PROVENTIL HFA;VENTOLIN HFA) 108 (90 Base) MCG/ACT inhaler Inhale 2 puffs into the lungs every 6 (six) hours as needed for wheezing or shortness of breath.   Cholecalciferol (VITAMIN D) 125 MCG (5000 UT) CAPS Take 1 capsule by mouth daily.   diphenhydrAMINE (BENADRYL) 12.5 MG/5ML liquid Take 25 mg by mouth 4 (four) times daily as needed for itching.   FLUoxetine (PROZAC) 40 MG capsule Take 40 mg by mouth daily.   gabapentin (NEURONTIN) 300 MG capsule Take 600 mg by mouth at bedtime as needed (Neuropathy).   insulin aspart (NOVOLOG) 100 UNIT/ML injection Inject 4 Units into the skin 3 (three) times daily with meals. Short-acting insulin   LANTUS SOLOSTAR 100 UNIT/ML Solostar Pen Inject 37 Units into the skin daily. This is your home dose of long-acting insulin.   Multiple Vitamins-Minerals (CERTAVITE/ANTIOXIDANTS) TABS TAKE 1 TABLET BY MOUTH DAILY   nicotine (NICODERM CQ - DOSED IN MG/24 HOURS) 21 mg/24hr patch Place 1 patch (21 mg total) onto the skin daily.   omeprazole (PRILOSEC) 20 MG capsule Take 20 mg by mouth daily.    OZEMPIC, 0.25 OR 0.5 MG/DOSE, 2 MG/1.5ML SOPN Inject 0.25 mg into the skin every 7 (seven) days.   rivaroxaban (XARELTO) 20 MG TABS tablet Take 1 tablet (20 mg total) by mouth daily with supper.   vitamin C (ASCORBIC ACID) 500 MG tablet TAKE 1 TABLET (500 MG TOTAL) BY MOUTH DAILY.   [DISCONTINUED] diltiazem (CARDIZEM CD) 240 MG 24 hr capsule Take 1 capsule by mouth once daily   [DISCONTINUED] lisinopril (ZESTRIL) 40 MG tablet Take 40 mg by mouth daily.   diltiazem (CARDIZEM CD) 240 MG 24 hr capsule Take 1 capsule (240 mg total) by mouth daily.   lisinopril (ZESTRIL) 40 MG tablet Take 1 tablet (40 mg total) by mouth daily.   No facility-administered encounter medications on file as of 04/24/2021.     Family Hx: The patient's family history includes CAD in her father;  Diabetes in her mother; Heart Problems in her father; Heart failure in her father.  ROS:   Please see the history of present illness.    Review of Systems  Constitutional: Negative.   HENT: Negative.    Respiratory: Negative.    Cardiovascular: Negative.   Gastrointestinal: Negative.   Musculoskeletal: Negative.   Neurological:  Positive for dizziness.  Psychiatric/Behavioral: Negative.    All other systems reviewed and are negative.   Labs/Other Tests and Data Reviewed:    Recent Labs: 04/18/2021: ALT 20 04/20/2021: BUN 14; Creatinine, Ser 0.73; Hemoglobin 13.8; Magnesium 2.1; Platelets 305; Potassium 4.1; Sodium 137   Recent Lipid Panel Lab Results  Component Value Date/Time   CHOL 136 01/30/2018 05:31 AM   TRIG 106 01/30/2018 05:31 AM   HDL 73 01/30/2018 05:31 AM   CHOLHDL 1.9 01/30/2018 05:31 AM   LDLCALC 42 01/30/2018 05:31 AM    Wt Readings from Last 3 Encounters:  04/24/21 138 lb 9.6 oz (62.9 kg)  04/18/21 150 lb (68 kg)  02/10/21 151 lb 10.8 oz (68.8 kg)     Exam:    Vital Signs:  BP (!) 102/52 (BP Location: Left Arm, Patient Position: Sitting, Cuff Size: Normal)   Pulse 85   Ht 5\' 2"  (1.575 m)    Wt 138 lb 9.6 oz (62.9 kg)   SpO2 98%   BMI 25.35 kg/m    Constitutional:  oriented to person, place, and time. No distress.  HENT:  Head: Grossly normal Eyes:  no discharge. No scleral icterus.  Neck: No JVD, no carotid bruits  Cardiovascular: Regular rate and rhythm, no murmurs appreciated Pulmonary/Chest: Clear to auscultation bilaterally, no wheezes or rails Abdominal: Soft.  no distension.  no tenderness.  Musculoskeletal: Normal range of motion Neurological:  normal muscle tone. Coordination normal. No atrophy Skin: Skin warm and dry Psychiatric: normal affect, pleasant   ASSESSMENT & PLAN:    Problem List Items Addressed This Visit       Cardiology Problems   PAF (paroxysmal atrial fibrillation) (HCC) - Primary (Chronic)   Relevant Medications   diltiazem (CARDIZEM CD) 240 MG 24 hr capsule   lisinopril (ZESTRIL) 40 MG tablet   Other Relevant Orders   EKG 12-Lead   Other Visit Diagnoses     Vasovagal syncope       Essential hypertension       Relevant Medications   diltiazem (CARDIZEM CD) 240 MG 24 hr capsule   lisinopril (ZESTRIL) 40 MG tablet   Other Relevant Orders   EKG 12-Lead   History of GI bleed       Tobacco use         PAF (paroxysmal atrial fibrillation) (HCC) - Plan: EKG 12-Lead continue on anticoagulation, Xarelto Elevated CHDS VASC : female, diabetic, vascular disease, hypertension She will confirm her medications with [place], will continue diltiazem 240 mg daily for now, if blood pressure running low may need to decrease the dose of either the diltiazem or lisinopril or both She reports recent weight loss  Orthostasis Blood pressure running low, some medication confusion, she will go home look at her medications and call us with the list If taking amlodipine benazepril, this will be stopped and will continue diltiazem and lisinopril at current dose If not on amlodipine benazepril, will decrease doses of her diltiazem/lisinopril Drop in  pressure likely from recent weight loss over 10 pounds per her account   Diabetic ketoacidosis without coma associated with other specified diabetes mellitus (HCC) - Numerous  hospital admissions for high sugars, DKA Recent admission for vomiting, glucose levels 500 Stressed importance of taking her diabetes medications   Substance abuse ([place]) We will recommend cessation from any alcohol, cocaine  Insulin dependent diabetes mellitus ([place]) Managed by primary care Recent hospital admission for sugars of 500, nausea vomiting  Syncope Prior episodes, none recently  1 episode in the setting of diarrhea, vasovagal response Recommend strict sugar control, hydration with low sugar beverage   Total encounter time more than 25 minutes  Greater than 50% was spent in counseling and coordination of care with the patient    Signed, Ida Rogue, [place] [facility] [id number], [place], [place] [address]

## 2021-04-24 NOTE — Telephone Encounter (Signed)
Patient is calling back states she is taking Amlodipine. She is also taking Diltiazem. Please call to discuss.

## 2021-04-24 NOTE — Telephone Encounter (Signed)
Able to reach back out to Michaela Johnson, advise on Dr. Donivan Scull medication changes  Given the severity of her symptoms I think we decrease her lisinopril down to 20 mg daily, diltiazem extended release 180 daily  Thx  TG   Michaela Johnson verbalized understanding, will pick up her new dose of diltiazem tomorrow and will use her 40 mg tab of lisinopril to cut in half for the 20 mg dose until she runs out. Lisinopril 20 mg sent to pharmacy as well.  Medication list updated and scripts sent in. Pt very thankful for the return call back. Nothing further at this time.

## 2021-04-24 NOTE — Telephone Encounter (Signed)
Was able to reach back out to Michaela Johnson, pt was seen in clinic and there were some confusion with her current cardiac meds. Pt called to inform what meds she actually has.   Diltiazem 240 mg QD Lisinopril 40 mg QD Xarelto 20 mg QD  Pt does NOT have amlodipine-benazepril   Advised pt will send to Dr. Rockey Situ for review, will call back if any changes need to made. Pt thankful for the call and will continue her daily meds at this time until further notice.

## 2021-10-17 ENCOUNTER — Encounter (HOSPITAL_COMMUNITY): Payer: Self-pay | Admitting: Radiology

## 2022-01-02 ENCOUNTER — Emergency Department: Payer: Self-pay

## 2022-01-02 ENCOUNTER — Inpatient Hospital Stay: Payer: Self-pay

## 2022-01-02 ENCOUNTER — Other Ambulatory Visit: Payer: Self-pay

## 2022-01-02 ENCOUNTER — Inpatient Hospital Stay
Admission: EM | Admit: 2022-01-02 | Discharge: 2022-01-07 | DRG: 481 | Disposition: A | Discharge status: Home / Self Care | Payer: Self-pay | Attending: Internal Medicine | Admitting: Internal Medicine

## 2022-01-02 DIAGNOSIS — F17200 Nicotine dependence, unspecified, uncomplicated: Secondary | ICD-10-CM | POA: Diagnosis present

## 2022-01-02 DIAGNOSIS — Z8249 Family history of ischemic heart disease and other diseases of the circulatory system: Secondary | ICD-10-CM

## 2022-01-02 DIAGNOSIS — S52551A Other extraarticular fracture of lower end of right radius, initial encounter for closed fracture: Secondary | ICD-10-CM

## 2022-01-02 DIAGNOSIS — E1169 Type 2 diabetes mellitus with other specified complication: Secondary | ICD-10-CM | POA: Diagnosis present

## 2022-01-02 DIAGNOSIS — K219 Gastro-esophageal reflux disease without esophagitis: Secondary | ICD-10-CM | POA: Diagnosis present

## 2022-01-02 DIAGNOSIS — Z888 Allergy status to other drugs, medicaments and biological substances status: Secondary | ICD-10-CM

## 2022-01-02 DIAGNOSIS — Z881 Allergy status to other antibiotic agents status: Secondary | ICD-10-CM

## 2022-01-02 DIAGNOSIS — S72141A Displaced intertrochanteric fracture of right femur, initial encounter for closed fracture: Principal | ICD-10-CM | POA: Diagnosis present

## 2022-01-02 DIAGNOSIS — F1090 Alcohol use, unspecified, uncomplicated: Secondary | ICD-10-CM | POA: Diagnosis present

## 2022-01-02 DIAGNOSIS — W541XXA Struck by dog, initial encounter: Secondary | ICD-10-CM

## 2022-01-02 DIAGNOSIS — I152 Hypertension secondary to endocrine disorders: Secondary | ICD-10-CM | POA: Diagnosis present

## 2022-01-02 DIAGNOSIS — Z6822 Body mass index (BMI) 22.0-22.9, adult: Secondary | ICD-10-CM

## 2022-01-02 DIAGNOSIS — D62 Acute posthemorrhagic anemia: Secondary | ICD-10-CM | POA: Diagnosis not present

## 2022-01-02 DIAGNOSIS — K7030 Alcoholic cirrhosis of liver without ascites: Secondary | ICD-10-CM | POA: Diagnosis present

## 2022-01-02 DIAGNOSIS — I480 Paroxysmal atrial fibrillation: Secondary | ICD-10-CM | POA: Diagnosis present

## 2022-01-02 DIAGNOSIS — E440 Moderate protein-calorie malnutrition: Secondary | ICD-10-CM | POA: Diagnosis present

## 2022-01-02 DIAGNOSIS — Z7901 Long term (current) use of anticoagulants: Secondary | ICD-10-CM

## 2022-01-02 DIAGNOSIS — F1010 Alcohol abuse, uncomplicated: Secondary | ICD-10-CM | POA: Diagnosis present

## 2022-01-02 DIAGNOSIS — Z885 Allergy status to narcotic agent status: Secondary | ICD-10-CM

## 2022-01-02 DIAGNOSIS — Z794 Long term (current) use of insulin: Secondary | ICD-10-CM

## 2022-01-02 DIAGNOSIS — R54 Age-related physical debility: Secondary | ICD-10-CM | POA: Diagnosis present

## 2022-01-02 DIAGNOSIS — Z597 Insufficient social insurance and welfare support: Secondary | ICD-10-CM

## 2022-01-02 DIAGNOSIS — F410 Panic disorder [episodic paroxysmal anxiety] without agoraphobia: Secondary | ICD-10-CM | POA: Diagnosis present

## 2022-01-02 DIAGNOSIS — E11649 Type 2 diabetes mellitus with hypoglycemia without coma: Secondary | ICD-10-CM | POA: Diagnosis not present

## 2022-01-02 DIAGNOSIS — Z833 Family history of diabetes mellitus: Secondary | ICD-10-CM

## 2022-01-02 DIAGNOSIS — E1159 Type 2 diabetes mellitus with other circulatory complications: Secondary | ICD-10-CM | POA: Diagnosis present

## 2022-01-02 DIAGNOSIS — Z79899 Other long term (current) drug therapy: Secondary | ICD-10-CM

## 2022-01-02 DIAGNOSIS — S5291XA Unspecified fracture of right forearm, initial encounter for closed fracture: Secondary | ICD-10-CM | POA: Diagnosis present

## 2022-01-02 DIAGNOSIS — M810 Age-related osteoporosis without current pathological fracture: Secondary | ICD-10-CM | POA: Diagnosis present

## 2022-01-02 DIAGNOSIS — F32A Depression, unspecified: Secondary | ICD-10-CM | POA: Diagnosis present

## 2022-01-02 DIAGNOSIS — E1140 Type 2 diabetes mellitus with diabetic neuropathy, unspecified: Secondary | ICD-10-CM | POA: Diagnosis present

## 2022-01-02 DIAGNOSIS — E119 Type 2 diabetes mellitus without complications: Secondary | ICD-10-CM

## 2022-01-02 DIAGNOSIS — F17210 Nicotine dependence, cigarettes, uncomplicated: Secondary | ICD-10-CM | POA: Diagnosis present

## 2022-01-02 DIAGNOSIS — G629 Polyneuropathy, unspecified: Secondary | ICD-10-CM

## 2022-01-02 DIAGNOSIS — E1165 Type 2 diabetes mellitus with hyperglycemia: Secondary | ICD-10-CM | POA: Diagnosis present

## 2022-01-02 LAB — CBC WITH DIFFERENTIAL/PLATELET
Abs Immature Granulocytes: 0.03 10*3/uL (ref 0.00–0.07)
Basophils Absolute: 0.1 10*3/uL (ref 0.0–0.1)
Basophils Relative: 1 %
Eosinophils Absolute: 0 10*3/uL (ref 0.0–0.5)
Eosinophils Relative: 0 %
HCT: 44.5 % (ref 36.0–46.0)
Hemoglobin: 14.5 g/dL (ref 12.0–15.0)
Immature Granulocytes: 0 %
Lymphocytes Relative: 9 %
Lymphs Abs: 1 10*3/uL (ref 0.7–4.0)
MCH: 32.7 pg (ref 26.0–34.0)
MCHC: 32.6 g/dL (ref 30.0–36.0)
MCV: 100.2 fL — ABNORMAL HIGH (ref 80.0–100.0)
Monocytes Absolute: 0.4 10*3/uL (ref 0.1–1.0)
Monocytes Relative: 3 %
Neutro Abs: 10.3 10*3/uL — ABNORMAL HIGH (ref 1.7–7.7)
Neutrophils Relative %: 87 %
Platelets: 181 10*3/uL (ref 150–400)
RBC: 4.44 MIL/uL (ref 3.87–5.11)
RDW: 11.7 % (ref 11.5–15.5)
WBC: 11.8 10*3/uL — ABNORMAL HIGH (ref 4.0–10.5)
nRBC: 0 % (ref 0.0–0.2)

## 2022-01-02 LAB — COMPREHENSIVE METABOLIC PANEL
ALT: 17 U/L (ref 0–44)
AST: 17 U/L (ref 15–41)
Albumin: 3.9 g/dL (ref 3.5–5.0)
Alkaline Phosphatase: 79 U/L (ref 38–126)
Anion gap: 9 (ref 5–15)
BUN: 26 mg/dL — ABNORMAL HIGH (ref 8–23)
CO2: 25 mmol/L (ref 22–32)
Calcium: 9.1 mg/dL (ref 8.9–10.3)
Chloride: 103 mmol/L (ref 98–111)
Creatinine, Ser: 0.84 mg/dL (ref 0.44–1.00)
GFR, Estimated: >60 mL/min (ref >60)
Glucose, Bld: 262 mg/dL — ABNORMAL HIGH (ref 70–99)
Potassium: 4.3 mmol/L (ref 3.5–5.1)
Sodium: 137 mmol/L (ref 135–145)
Total Bilirubin: 0.7 mg/dL (ref 0.3–1.2)
Total Protein: 7.6 g/dL (ref 6.5–8.1)

## 2022-01-02 LAB — GLUCOSE, CAPILLARY
Glucose-Capillary: 137 mg/dL — ABNORMAL HIGH (ref 70–99)
Glucose-Capillary: 211 mg/dL — ABNORMAL HIGH (ref 70–99)

## 2022-01-02 MED ORDER — GABAPENTIN 300 MG PO CAPS: 600.0000 mg | ORAL_CAPSULE | Freq: Every evening | ORAL | Status: DC | PRN

## 2022-01-02 MED ORDER — VITAMIN D 25 MCG (1000 UNIT) PO TABS
5000.0000 [IU] | ORAL_TABLET | Freq: Every day | ORAL | Status: DC
  Administered 2022-01-02 – 2022-01-07 (×5): 5000 [IU] via ORAL
  Filled 2022-01-02 (×5): qty 5

## 2022-01-02 MED ORDER — DILTIAZEM HCL ER COATED BEADS 180 MG PO CP24
180.0000 mg | ORAL_CAPSULE | Freq: Every day | ORAL | Status: DC
  Administered 2022-01-03 – 2022-01-07 (×4): 180 mg via ORAL
  Filled 2022-01-02 (×5): qty 1

## 2022-01-02 MED ORDER — ONDANSETRON HCL 4 MG/2ML IJ SOLN
4.0000 mg | Freq: Once | INTRAMUSCULAR | Status: AC
  Administered 2022-01-02: 4 mg via INTRAVENOUS
  Filled 2022-01-02: qty 2

## 2022-01-02 MED ORDER — MORPHINE SULFATE (PF) 4 MG/ML IV SOLN
4.0000 mg | INTRAVENOUS | Status: DC | PRN
  Administered 2022-01-02 (×2): 4 mg via INTRAVENOUS
  Filled 2022-01-02 (×2): qty 1

## 2022-01-02 MED ORDER — BUPIVACAINE HCL (PF) 0.5 % IJ SOLN
30.0000 mL | Freq: Once | INTRAMUSCULAR | Status: AC
  Administered 2022-01-02: 30 mL
  Filled 2022-01-02: qty 30

## 2022-01-02 MED ORDER — NICOTINE 21 MG/24HR TD PT24
21.0000 mg | MEDICATED_PATCH | Freq: Every day | TRANSDERMAL | Status: DC
  Administered 2022-01-02 – 2022-01-06 (×4): 21 mg via TRANSDERMAL
  Filled 2022-01-02 (×5): qty 1

## 2022-01-02 MED ORDER — LORAZEPAM 1 MG PO TABS: 1.0000 mg | ORAL_TABLET | Freq: Four times a day (QID) | ORAL | Status: DC | PRN

## 2022-01-02 MED ORDER — INSULIN ASPART 100 UNIT/ML IJ SOLN
0.0000 [IU] | Freq: Three times a day (TID) | INTRAMUSCULAR | Status: DC
  Administered 2022-01-03: 8 [IU] via SUBCUTANEOUS
  Administered 2022-01-03 (×2): 5 [IU] via SUBCUTANEOUS
  Administered 2022-01-04: 15 [IU] via SUBCUTANEOUS
  Administered 2022-01-04: 3 [IU] via SUBCUTANEOUS
  Administered 2022-01-04: 11 [IU] via SUBCUTANEOUS
  Administered 2022-01-05: 5 [IU] via SUBCUTANEOUS
  Administered 2022-01-05: 11 [IU] via SUBCUTANEOUS
  Administered 2022-01-05: 15 [IU] via SUBCUTANEOUS
  Administered 2022-01-05: 8 [IU] via SUBCUTANEOUS
  Administered 2022-01-06: 3 [IU] via SUBCUTANEOUS
  Administered 2022-01-06: 11 [IU] via SUBCUTANEOUS
  Administered 2022-01-07: 8 [IU] via SUBCUTANEOUS
  Filled 2022-01-02 (×13): qty 1

## 2022-01-02 MED ORDER — PANTOPRAZOLE SODIUM 40 MG PO TBEC
40.0000 mg | DELAYED_RELEASE_TABLET | Freq: Every day | ORAL | Status: DC
  Administered 2022-01-02 – 2022-01-07 (×5): 40 mg via ORAL
  Filled 2022-01-02 (×5): qty 1

## 2022-01-02 MED ORDER — DILTIAZEM HCL ER COATED BEADS 180 MG PO CP24
180.0000 mg | ORAL_CAPSULE | Freq: Every day | ORAL | Status: DC
  Filled 2022-01-02: qty 1

## 2022-01-02 MED ORDER — INSULIN ASPART 100 UNIT/ML IJ SOLN
0.0000 [IU] | INTRAMUSCULAR | Status: DC
  Administered 2022-01-02: 5 [IU] via SUBCUTANEOUS
  Filled 2022-01-02: qty 1

## 2022-01-02 MED ORDER — MORPHINE SULFATE (PF) 4 MG/ML IV SOLN
4.0000 mg | INTRAVENOUS | Status: DC | PRN
  Administered 2022-01-02: 4 mg via INTRAVENOUS
  Filled 2022-01-02 (×2): qty 1

## 2022-01-02 MED ORDER — MORPHINE SULFATE (PF) 2 MG/ML IV SOLN
2.0000 mg | INTRAVENOUS | Status: DC | PRN
  Administered 2022-01-03 – 2022-01-05 (×5): 2 mg via INTRAVENOUS
  Filled 2022-01-02 (×5): qty 1

## 2022-01-02 MED ORDER — TEMAZEPAM 15 MG PO CAPS
15.0000 mg | ORAL_CAPSULE | Freq: Every evening | ORAL | Status: DC | PRN
  Administered 2022-01-03 – 2022-01-06 (×3): 15 mg via ORAL
  Filled 2022-01-02 (×3): qty 1

## 2022-01-02 MED ORDER — FLUOXETINE HCL 20 MG PO CAPS
40.0000 mg | ORAL_CAPSULE | Freq: Every day | ORAL | Status: DC
  Administered 2022-01-02 – 2022-01-07 (×5): 40 mg via ORAL
  Filled 2022-01-02 (×6): qty 2

## 2022-01-02 MED ORDER — INSULIN GLARGINE-YFGN 100 UNIT/ML ~~LOC~~ SOLN
36.0000 [IU] | Freq: Every day | SUBCUTANEOUS | Status: DC
  Administered 2022-01-03: 36 [IU] via SUBCUTANEOUS
  Filled 2022-01-02: qty 0.36

## 2022-01-02 MED ORDER — HEPARIN SODIUM (PORCINE) 5000 UNIT/ML IJ SOLN
5000.0000 [IU] | Freq: Three times a day (TID) | INTRAMUSCULAR | Status: DC
  Administered 2022-01-02 – 2022-01-03 (×4): 5000 [IU] via SUBCUTANEOUS
  Filled 2022-01-02 (×4): qty 1

## 2022-01-02 MED ORDER — LISINOPRIL 20 MG PO TABS
20.0000 mg | ORAL_TABLET | Freq: Every day | ORAL | Status: DC
  Administered 2022-01-02 – 2022-01-06 (×4): 20 mg via ORAL
  Filled 2022-01-02 (×7): qty 1

## 2022-01-02 MED ORDER — DOCUSATE SODIUM 100 MG PO CAPS
100.0000 mg | ORAL_CAPSULE | Freq: Two times a day (BID) | ORAL | Status: DC
  Administered 2022-01-02 – 2022-01-03 (×3): 100 mg via ORAL
  Filled 2022-01-02 (×3): qty 1

## 2022-01-02 MED ORDER — HYDROCODONE-ACETAMINOPHEN 5-325 MG PO TABS: 1.0000 | ORAL_TABLET | Freq: Four times a day (QID) | ORAL | Status: DC | PRN

## 2022-01-02 NOTE — ED Provider Notes (Signed)
[facility] Provider Note    Event Date/Time   First MD Initiated Contact with Patient 01/02/22 1505     (approximate)   History   Fall and Wrist Pain (Hip pain/)   HPI  Michaela Johnson is a 61 y.o. female history of A-fib on Eliquis presents to the ER for evaluation of right wrist pain and right hip pain that occurred after mechanical fall where she got tripped up by her dog while she was working in the garden today.  Did not hit her head.  No neck pain.  No chest pain or shortness of breath.  Pain in the right hip and wrist are moderate to severe.  Was given fentanyl and IV with EMS.     Physical Exam   Triage Vital Signs: ED Triage Vitals  Enc Vitals Group     BP 01/02/22 1431 105/75     Pulse Rate 01/02/22 1431 90     Resp 01/02/22 1431 19     Temp 01/02/22 1431 99 F (37.2 C)     Temp Source 01/02/22 1431 Oral     SpO2 01/02/22 1431 96 %     Weight 01/02/22 1433 125 lb (56.7 kg)     Height 01/02/22 1433 '5\' 2"'$  (1.575 m)     Head Circumference --      Peak Flow --      Pain Score 01/02/22 1433 8     Pain Loc --      Pain Edu? --      Excl. in [place]? --     Most recent vital signs: Vitals:   01/02/22 1431 01/02/22 1730  BP: 105/75 101/63  Pulse: 90 90  Resp: 19 18  Temp: 99 F (37.2 C)   SpO2: 96% 97%     Constitutional: Alert  Eyes: Conjunctivae are normal.  Head: Atraumatic. Nose: No congestion/rhinnorhea. Mouth/Throat: Mucous membranes are moist.   Neck: Painless ROM.  Cardiovascular:   Good peripheral circulation. Respiratory: Normal respiratory effort.  No retractions.  Gastrointestinal: Soft and nontender.  Musculoskeletal: Mild swelling and tenderness palpation of the distal right forearm.  No puncture wound or laceration.  Neurovascular intact.  Right hip pain with logroll. Neurologic:  MAE spontaneously. No gross focal neurologic deficits are appreciated.  Skin:  Skin is warm, dry and intact. No rash noted. Psychiatric:  Mood and affect are normal. Speech and behavior are normal.    ED Results / Procedures / Treatments   Labs (all labs ordered are listed, but only abnormal results are displayed) Labs Reviewed  CBC WITH DIFFERENTIAL/PLATELET - Abnormal; Notable for the following components:      Result Value   WBC 11.8 (*)    MCV 100.2 (*)    Neutro Abs 10.3 (*)    All other components within normal limits  COMPREHENSIVE METABOLIC PANEL - Abnormal; Notable for the following components:   Glucose, Bld 262 (*)    BUN 26 (*)    All other components within normal limits     EKG  ED ECG REPORT I, Merlyn Lot, the attending physician, personally viewed and interpreted this ECG.   Date: 01/02/2022  EKG Time: 15:42  Rate: 90  Rhythm: sinus  Axis: normal  Intervals:lbbb  ST&T Change: no stemi, no depressions    RADIOLOGY Please see ED Course for my review and interpretation.  I personally reviewed all radiographic images ordered to evaluate for the above acute complaints and reviewed radiology reports and findings.  These findings were personally discussed with the patient.  Please see medical record for radiology report.    PROCEDURES:  Critical Care performed: No  .Ortho Injury Treatment  Date/Time: 01/02/2022 5:33 PM  Performed by: Merlyn Lot, MD Authorized by: Merlyn Lot, MD   Consent:    Consent obtained:  Verbal   Risks discussed:  Nerve damage, irreducible dislocation, recurrent dislocation, restricted joint movement, vascular damage and stiffness   Alternatives discussed:  Immobilization and alternative treatmentInjury location: wrist Location details: right wrist Injury type: fracture Fracture type: distal radius and ulnar styloid Pre-procedure neurovascular assessment: neurovascularly intact Anesthesia: hematoma block  Anesthesia: Local anesthesia used: yes Local Anesthetic: bupivacaine 0.5% without epinephrine Anesthetic total: 5 mL Manipulation  performed: yes Reduction successful: yes X-ray confirmed reduction: yes Immobilization: splint Splint type: sugar tong Splint Applied by: ED Provider Supplies used: Ortho-Glass Post-procedure neurovascular assessment: post-procedure neurovascularly intact      MEDICATIONS ORDERED IN ED: Medications  morphine (PF) 4 MG/ML injection 4 mg (4 mg Intravenous Given 01/02/22 1533)  ondansetron (ZOFRAN) injection 4 mg (4 mg Intravenous Given 01/02/22 1533)  bupivacaine(PF) (MARCAINE) 0.5 % injection 30 mL (30 mLs Infiltration Given by Other 01/02/22 1627)     IMPRESSION / MDM / [place] / ED COURSE  I reviewed the triage vital signs and the nursing notes.                              Differential diagnosis includes, but is not limited to, fracture, contusion, dislocation, hematoma,  Presenting with right-sided hip pain and right wrist pain as described above.  No head injury no neck pain.  Simply mechanical fall.  Blood work as well as imaging will be ordered for above differential.  Clinical Course as of 01/02/22 1801  Thu Jan 02, 2022  1531 X-ray the my interpretation shows evidence of right inner troches fracture as well as right distal radius fracture. [PR]  1536 Case discussed in consultation with Dr. Roland Rack  of orthopedics.  The patient is on Eliquis she will require hospitalization.  We will give additional IV narcotic medication for [PR]  5625 Postreduction x-ray of right wrist on my interpretation with improved alignment. [PR]  1801 Hospitalist consulted for admission. [PR]    Clinical Course User Index [PR] Merlyn Lot, MD    Patient's presentation is most consistent with acute complicated illness / injury requiring diagnostic workup.   FINAL CLINICAL IMPRESSION(S) / ED DIAGNOSES   Final diagnoses:  Other closed extra-articular fracture of distal end of right radius, initial encounter  Closed displaced intertrochanteric fracture of right femur, initial  encounter ([place])     Rx / DC Orders   ED Discharge Orders     None        Note:  This document was prepared using Dragon voice recognition software and may include unintentional dictation errors.    Merlyn Lot, MD 01/02/22 [id number]

## 2022-01-02 NOTE — Consult Note (Signed)
ORTHOPAEDIC CONSULTATION  REQUESTING PHYSICIAN: Merlyn Lot, MD  Chief Complaint:   Right hip pain and right wrist pain.  History of Present Illness: Michaela Johnson is a 61 y.o. female with multiple medical problems including tobacco abuse, alcohol abuse, hypertension, paroxysmal atrial fibrillation for which she is on Eliquis, diabetes, anxiety/depression, gastroesophageal reflux disease, osteoporosis, vitamin D deficiency, and panic attacks.  The patient lives independently and was in her usual state of health today when she was knocked over by her dog while out in the yard, landing on her right side and injuring her right wrist and hip.  The patient was brought to the emergency room where x-rays demonstrated a displaced intertrochanteric fracture of the right hip, as well as a displaced comminuted right distal radius fracture with an ulnar styloid process fracture.  The patient denies striking her head or losing consciousness.  She also denies any lightheadedness, dizziness, chest pain, shortness of breath, or other symptoms which may have precipitated her fall.  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 07/30/2020   Constipation    Depression    Diabetes mellitus without complication (HCC)    Dysrhythmia     PAF   GERD (gastroesophageal reflux disease)    Hypertension    Neuropathy    Nicotine dependence 07/31/2020   PAF (paroxysmal atrial fibrillation) (HCC)    Panic attacks    Pneumonia    Tobacco use    Vitamin D deficiency    Past Surgical History:  Procedure Laterality Date   ANKLE FUSION Right 07/30/2020   Procedure: ANKLE FUSION;  Surgeon: Altamese Roosevelt, MD;  Location: [place];  Service: Orthopedics;  Laterality: Right;    APPLICATION OF WOUND VAAPPLICATION OF WOUND VAC (Right Ankle)C (Right Ankle)  89/21/1943   APPLICATION OF WOUND VAC Right 07/30/2020   Procedure: APPLICATION OF WOUND VAC;  Surgeon: Altamese Newdale, MD;  Location: [place];  Service: Orthopedics;  Laterality: Right;   HARDWARE REMOVAL Right 07/30/2020   Procedure: HARDWARE REMOVAL RIGHT ANKLE;  Surgeon: Altamese Oakwood, MD;  Location: [place];  Service: Orthopedics;  Laterality: Right;   HARDWARE REMOVAL RIGHT ANKLE (Right Ankle  07/31/2020   ORIF ANKLE FRACTURE Right 07/04/2020   Procedure: OPEN REDUCTION INTERNAL FIXATION (ORIF) ANKLE FRACTURE;  Surgeon: Lovell Sheehan, MD;  Location: [facility];  Service: Orthopedics;  Laterality: Right;   Social History   Socioeconomic History   Marital status: Married    Spouse name: Not on file   Number of children: Not on file   Years of education: Not on file   Highest education level: Not on file  Occupational History   Not on file  Tobacco Use   Smoking status: Some Days    Packs/day: 1.00    Years: 41.00    Total pack years: 41.00    Types: Cigarettes   Smokeless tobacco: Never  Vaping Use   Vaping Use: Never used  Substance and Sexual Activity   Alcohol use: Yes    Alcohol/week: 6.0 standard drinks of alcohol    Types: 6 Standard drinks or equivalent per week   Drug use: Yes    Frequency: 7.0 times per week    Types: Marijuana   Sexual activity: Not on file  Other Topics Concern   Not on  file  Social History Narrative   Not on file   Social Determinants of Health   Financial Resource Strain: Not on file  Food Insecurity: Not on file  Transportation Needs: Not on file  Physical Activity: Not on file  Stress: Not on file  Social Connections: Not on file   Family History  Problem Relation Age of Onset   Diabetes Mother    CAD Father    Heart Problems Father    Heart failure Father    Allergies  Allergen Reactions   Codeine Itching   Levaquin [Levofloxacin In D5w] Itching    Prior to Admission medications   Medication Sig Start Date End Date Taking? Authorizing Provider  albuterol (PROVENTIL HFA;VENTOLIN HFA) 108 (90 Base) MCG/ACT inhaler Inhale 2 puffs into the lungs every 6 (six) hours as needed for wheezing or shortness of breath. 02/14/18   Pyreddy, Reatha Harps, MD  Cholecalciferol (VITAMIN D) 125 MCG (5000 UT) CAPS Take 1 capsule by mouth daily. 08/03/20   Ainsley Spinner, PA-C  diltiazem (CARDIZEM CD) 180 MG 24 hr capsule Take 1 capsule (180 mg total) by mouth daily. 04/24/21   Minna Merritts, MD  diphenhydrAMINE (BENADRYL) 12.5 MG/5ML liquid Take 25 mg by mouth 4 (four) times daily as needed for itching.    [provider]  FLUoxetine (PROZAC) 40 MG capsule Take 40 mg by mouth daily.    [provider]  gabapentin (NEURONTIN) 300 MG capsule Take 600 mg by mouth at bedtime as needed (Neuropathy). 02/28/20   [provider]  insulin aspart (NOVOLOG) 100 UNIT/ML injection Inject 4 Units into the skin 3 (three) times daily with meals. Short-acting insulin 04/20/21 07/19/21  Enzo Bi, MD  LANTUS SOLOSTAR 100 UNIT/ML Solostar Pen Inject 37 Units into the skin daily. This is your home dose of long-acting insulin. 04/20/21   Enzo Bi, MD  lisinopril (ZESTRIL) 20 MG tablet Take 1 tablet (20 mg total) by mouth daily. 04/24/21 07/23/21  Minna Merritts, MD  nicotine (NICODERM CQ - DOSED IN MG/24 HOURS) 21 mg/24hr patch Place 1 patch (21 mg total) onto the skin daily. 04/21/21   Enzo Bi, MD  omeprazole (PRILOSEC) 20 MG capsule Take 20 mg by mouth daily.    [provider]  OZEMPIC, 0.25 OR 0.5 MG/DOSE, 2 MG/1.5ML SOPN Inject 0.25 mg into the skin every 7 (seven) days. 02/06/21   [provider]  rivaroxaban (XARELTO) 20 MG TABS tablet Take 1 tablet (20 mg total) by mouth daily with supper. 03/09/19   Minna Merritts, MD   DG Chest Port 1 View  Result Date: 01/02/2022 CLINICAL DATA:  Fall, right hip and right wrist fractures. EXAM:  PORTABLE CHEST 1 VIEW COMPARISON:  04/18/2021 chest CT and radiograph FINDINGS: Atherosclerotic calcification of the aortic arch. Old healed right posterolateral fourth and fifth rib fractures. Old displaced in nonunited mid clavicular fracture on the left. Old left anterior rib fractures. Subtle irregularity in the right mid clavicle is noted, but this appears relatively similar to 04/18/2021, and accordingly is likely not an acute fracture. Bony demineralization is present. The lungs appear clear. Heart size within normal limits. IMPRESSION: 1. Old bilateral rib fractures and old displaced nonunited left clavicular fracture. 2.  Aortic Atherosclerosis (ICD10-I70.0). Electronically Signed   By: Van Clines M.D.   On: 01/02/2022 15:45   DG Wrist 2 Views Right  Result Date: 01/02/2022 CLINICAL DATA:  Fall, right wrist pain and deformity. EXAM: RIGHT WRIST - 2 VIEW COMPARISON:  None Available. FINDINGS: Distal radial fracture with transverse component in the metaphysis and a longitudinal component extending to the distal radial articular surface. Deformity of the ulnar styloid with multiple well corticated ossicles, cannot exclude new acute fracture at the base of the ulnar styloid. Degenerative loss of articular space at the first MCP joint. IMPRESSION: 1. Distal radial mildly comminuted fracture with transverse metaphyseal component and longitudinal component extending to the distal radial articular surface. 2. Deformity of the ulnar styloid with multiple well corticated ossicles, cannot exclude acute fracture through the base of the ulnar styloid. 3. Degenerative findings at the first MCP joint. Electronically Signed   By: Van Clines M.D.   On: 01/02/2022 15:41   DG HIP UNILAT WITH PELVIS 2-3 VIEWS RIGHT  Result Date: 01/02/2022 CLINICAL DATA:  Fall.  Right hip pain. EXAM: DG HIP (WITH OR WITHOUT PELVIS) 2-3V RIGHT COMPARISON:  CT scan 04/18/2021 FINDINGS: Intertrochanteric right hip fracture  with mild varus angulation. Chronic bone island in the right iliac bone. Bony demineralization.  Vascular calcifications noted. IMPRESSION: 1. Acute right intertrochanteric hip fracture with mild varus angulation. Electronically Signed   By: Van Clines M.D.   On: 01/02/2022 15:39    Positive ROS: All other systems have been reviewed and were otherwise negative with the exception of those mentioned in the HPI and as above.  Physical Exam: General:  Alert, no acute distress Psychiatric:  Patient is competent for consent with normal mood and affect   Cardiovascular:  No pedal edema Respiratory:  No wheezing, non-labored breathing GI:  Abdomen is soft and non-tender Skin:  No lesions in the area of chief complaint Neurologic:  Sensation intact distally Lymphatic:  No axillary or cervical lymphadenopathy  Orthopedic Exam:  Orthopedic examination is limited to the right hip and lower extremity.  The right lower extremity is somewhat shortened and externally rotated as compared to the left.  Skin inspection around the right hip is unremarkable.  No swelling, erythema, ecchymosis, abrasions, or other skin abnormalities are identified.  She has mild tenderness to palpation of the lateral aspect of the right hip.  She has more severe pain with any attempted active or passive motion of the hip.  She is neurovascularly intact to the right lower extremity and foot.  Orthopedic examination also was limited to the right forearm and hand.  There is no obvious deformity of the right wrist region associated with moderate swelling and early ecchymosis.  No erythema or abrasions are identified.  She has moderate tenderness to palpation over the dorsal aspect of the wrist.  She has more severe pain with any attempted active or passive motion of the wrist.  She is able to flex and extend her fingers, although motion is limited secondary to pain and apprehension.  She is grossly neurovascular intact to all  digits.  X-rays:  Recent x-rays of the pelvis and right hip are available for review and have been reviewed by myself.  These films demonstrate a displaced intertrochanteric fracture of the right hip.  No significant degenerative changes of the right hip joint are noted.  No lytic lesions or other acute bony abnormalities are identified.  Recent x-rays of the right wrist also are available for review and have been reviewed by myself.  These films demonstrate a comminuted displaced intra-articular fracture of the right distal radius with an ulnar styloid fracture.  No significant degenerative changes of the radiocarpal joint are noted.  No lytic lesions or other acute bony abnormalities are  identified.  Assessment: 1.  Displaced intertrochanteric fracture right hip. 2.  Closed displaced right distal radius fracture.  Plan: The treatment options, including both surgical and nonsurgical choices, have been discussed in detail with the patient.  The patient would like to proceed with surgical intervention to include an intramedullary nailing of the displaced right intertrochanteric hip fracture.  The patient is about to undergo an attempted closed reduction of the wrist fracture in the emergency room by the ER provider.  If successful, then no further treatment may be necessary for the wrist.  If unsuccessful, the patient may also undergo an open reduction and internal fixation of the right distal radius fracture.  The risks (including bleeding, infection, nerve and/or blood vessel injury, persistent or recurrent pain, loosening or failure of the components, leg length inequality, dislocation, need for further surgery, blood clots, strokes, heart attacks or arrhythmias, pneumonia, etc.) and benefits of the surgical procedure(s) were discussed.  The patient states her understanding and agrees to proceed.  A formal written consent will be obtained by the nursing staff.  Thank you for asking me to participate  in the care of this most pleasant yet unfortunate woman.  I will be happy to follow her with you.   Pascal Lux, MD  Beeper #:  [phone]  01/02/2022 5:31 PM

## 2022-01-02 NOTE — ED Notes (Signed)
This RN attempted PIV x2, IV team consult placed.  EMS line removed due to it being on top of wrist deformity.

## 2022-01-02 NOTE — ED Notes (Signed)
Lab called to state CMP was hemolyzed, lab asked to come redraw due to difficulty stick and that blood was taken from a US guided IV

## 2022-01-02 NOTE — ED Notes (Signed)
Pt presents to ED with c/o of having  mechanical fall PTA. Pt state she was walking her dog and it tripped her up. Pt does present with EMS splint to R wrist, EMS states they noticed a deformity to this wrist, pt is also c/o of R hip pain. Pt denies LOC, pt does take a blood thinner but denies hitting her head. Pt is currently A&Ox4 at this time.   R wrist and R pedal pulse palpable at this time with strong pulses.

## 2022-01-02 NOTE — H&P (Signed)
History and Physical   [facility] - [place] @ [facility] Admission History and Physical [employer], D.O.    Patient Name: Michaela Johnson MR#: [id number] Date of Birth: 01-Sep-1960 Date of Admission: 01/02/2022  Referring MD/NP/PA: Dr. Quentin Cornwall Primary Care Physician: [name], NP  Chief Complaint:  Chief Complaint  Patient presents with   Fall   Wrist Pain    Hip pain     HPI: Michaela Johnson is a 61 y.o. female with a known history of anxiety, depression,  Afib on Eliquis, DM, EtOH with cirrhosis, HTN, GERD, osteoporosis presents to the emergency department for evaluation of right hip pain s/p mechanical fall this afternoon.  Patient was knocked over by her dog landed on her right hip and right arm.  She complains of right wrist and forearm pain as well as right hip pain.  She denies any preceding symptoms like dizziness, lightheadedness, loss of consciousness.   Patient denies fevers/chills, weakness, dizziness, chest pain, shortness of breath, N/V/C/D, abdominal pain, dysuria/frequency, changes in mental status.    Otherwise there has been no change in status. Patient has been taking medication as prescribed and there has been no recent change in medication or diet.  No recent antibiotics.  There has been no recent illness, hospitalizations, travel or sick contacts.    She is usually functionally independent with ADLs.   EMS/ED Course: Patient received marcaine, morphine and Zofran. Medical admission has been requested for further management of preop evaluation and orthopedic management of her fractures   R radial fracture was reduced and splinted in ER.   Review of Systems:  CONSTITUTIONAL: No fever/chills, fatigue, weakness, weight gain/loss, headache. EYES: No blurry or double vision. ENT: No tinnitus, postnasal drip, redness or soreness of the oropharynx. RESPIRATORY: No cough, dyspnea, wheeze.  No hemoptysis.  CARDIOVASCULAR: No chest pain, palpitations,  syncope, orthopnea. No lower extremity edema.  GASTROINTESTINAL: No nausea, vomiting, abdominal pain, diarrhea, constipation.  No hematemesis, melena or hematochezia. GENITOURINARY: No dysuria, frequency, hematuria. ENDOCRINE: No polyuria or nocturia. No heat or cold intolerance. HEMATOLOGY: No anemia, bruising, bleeding. INTEGUMENTARY: No rashes, ulcers, lesions. MUSCULOSKELETAL: No arthritis, gout, dyspnea. NEUROLOGIC: No numbness, tingling, ataxia, seizure-type activity, weakness. PSYCHIATRIC: No anxiety, depression, insomnia.   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 07/30/2020   Constipation    Depression    Diabetes mellitus without complication (HCC)    Dysrhythmia     PAF   GERD (gastroesophageal reflux disease)    Hypertension    Neuropathy    Nicotine dependence 07/31/2020   PAF (paroxysmal atrial fibrillation) (HCC)    Panic attacks    Pneumonia    Tobacco use    Vitamin D deficiency     Past Surgical History:  Procedure Laterality Date   ANKLE FUSION Right 07/30/2020   Procedure: ANKLE FUSION;  Surgeon: Altamese Hartman, MD;  Location: [place];  Service: Orthopedics;  Laterality: Right;   APPLICATION OF WOUND VAAPPLICATION OF WOUND VAC (Right Ankle)C (Right Ankle)  43/32/9518   APPLICATION OF WOUND VAC Right 07/30/2020   Procedure: APPLICATION OF WOUND VAC;  Surgeon: Altamese Cloquet, MD;  Location: [place];  Service: Orthopedics;  Laterality: Right;   HARDWARE REMOVAL Right 07/30/2020   Procedure: HARDWARE REMOVAL RIGHT ANKLE;  Surgeon: Altamese , MD;  Location: [place];  Service: Orthopedics;  Laterality: Right;   HARDWARE REMOVAL RIGHT ANKLE (Right Ankle  07/31/2020   ORIF ANKLE FRACTURE Right 07/04/2020   Procedure: OPEN REDUCTION INTERNAL FIXATION (ORIF) ANKLE FRACTURE;  Surgeon: Lovell Sheehan, MD;  Location: [facility];  Service: Orthopedics;  Laterality: Right;     reports that she has been smoking cigarettes. She has a 41.00 pack-year smoking history. She has never used smokeless tobacco. She reports current alcohol use of about 6.0 standard drinks of alcohol per week. She reports current drug use. Frequency: 7.00 times per week. Drug: Marijuana.  Allergies  Allergen Reactions   Codeine Itching   Levaquin [Levofloxacin In D5w] Itching    Family History  Problem Relation Age of Onset   Diabetes Mother    CAD Father    Heart Problems Father    Heart failure Father     Prior to Admission medications   Medication Sig Start Date End Date Taking? Authorizing Provider  albuterol (PROVENTIL HFA;VENTOLIN HFA) 108 (90 Base) MCG/ACT inhaler Inhale 2 puffs into the lungs every 6 (six) hours as needed for wheezing or shortness of breath. 02/14/18   Pyreddy, Reatha Harps, MD  Cholecalciferol (VITAMIN D) 125 MCG (5000 UT) CAPS Take 1 capsule by mouth daily. 08/03/20   Ainsley Spinner, PA-C  diltiazem (CARDIZEM CD) 180 MG 24 hr capsule Take 1 capsule (180 mg total) by mouth daily. 04/24/21   Minna Merritts, MD  diphenhydrAMINE (BENADRYL) 12.5 MG/5ML liquid Take 25 mg by mouth 4 (four) times daily as needed for itching.    [provider]  FLUoxetine (PROZAC) 40 MG capsule Take 40 mg by mouth daily.    [provider]  gabapentin (NEURONTIN) 300 MG capsule Take 600 mg by mouth at bedtime as needed (Neuropathy). 02/28/20   [provider]  insulin aspart (NOVOLOG) 100 UNIT/ML injection Inject 4 Units into the skin 3 (three) times daily with meals. Short-acting insulin 04/20/21 07/19/21  Enzo Bi, MD  LANTUS SOLOSTAR 100 UNIT/ML Solostar Pen Inject 37 Units into the skin daily. This is your home dose of long-acting insulin. 04/20/21   Enzo Bi, MD  lisinopril (ZESTRIL) 20 MG tablet Take 1 tablet (20 mg total) by mouth daily. 04/24/21 07/23/21  Minna Merritts, MD   nicotine (NICODERM CQ - DOSED IN MG/24 HOURS) 21 mg/24hr patch Place 1 patch (21 mg total) onto the skin daily. 04/21/21   Enzo Bi, MD  omeprazole (PRILOSEC) 20 MG capsule Take 20 mg by mouth daily.    [provider]  OZEMPIC, 0.25 OR 0.5 MG/DOSE, 2 MG/1.5ML SOPN Inject 0.25 mg into the skin every 7 (seven) days. 02/06/21   [provider]  rivaroxaban (XARELTO) 20 MG TABS tablet Take 1 tablet (20 mg total) by mouth daily with supper. 03/09/19   Minna Merritts, MD    Physical Exam: Vitals:   01/02/22 1431 01/02/22 1433 01/02/22 1730 01/02/22 1830  BP: 105/75  101/63 128/73  Pulse: 90  90 86  Resp: '19  18 18  '$ Temp: 99 F (37.2 C)     TempSrc: Oral     SpO2: 96%  97% 94%  Weight:  56.7 kg    Height:  '5\' 2"'$  (1.575 m)      GENERAL: 61 y.o.-year-old white female patient, well-developed, well-nourished lying in the bed in no acute distress.  Pleasant and cooperative.   HEENT: Head atraumatic, normocephalic. Pupils equal. Mucus membranes moist. NECK: Supple. No JVD. CHEST: Normal  breath sounds bilaterally. No wheezing, rales, rhonchi or crackles. No use of accessory muscles of respiration.   CARDIOVASCULAR: S1, S2 normal. No murmurs, rubs, or gallops. Cap refill <2 seconds. Pulses intact distally.  ABDOMEN: Soft, nondistended, nontender. No rebound, guarding, rigidity. Normoactive bowel sounds present in all four quadrants.  EXTREMITIES: right leg shortened and externally rotated.  No pedal edema, cyanosis, or clubbing. No calf tenderness or Homan's sign.  NEUROLOGIC: The patient is alert and oriented x 3. Cranial nerves II through XII are grossly intact with no focal sensorimotor deficit. PSYCHIATRIC:  Normal affect, mood, thought content. SKIN: Warm, dry, and intact without obvious rash, lesion, or ulcer.    Labs on Admission:  CBC: Recent Labs  Lab 01/02/22 1627  WBC 11.8*  NEUTROABS 10.3*  HGB 14.5  HCT 44.5  MCV 100.2*  PLT 595   Basic Metabolic  Panel: Recent Labs  Lab 01/02/22 1729  NA 137  K 4.3  CL 103  CO2 25  GLUCOSE 262*  BUN 26*  CREATININE 0.84  CALCIUM 9.1   GFR: Estimated Creatinine Clearance: 54.9 mL/min (by C-G formula based on SCr of 0.84 mg/dL). Liver Function Tests: Recent Labs  Lab 01/02/22 1729  AST 17  ALT 17  ALKPHOS 79  BILITOT 0.7  PROT 7.6  ALBUMIN 3.9   No results for input(s): "LIPASE", "AMYLASE" in the last 168 hours. No results for input(s): "AMMONIA" in the last 168 hours. Coagulation Profile: No results for input(s): "INR", "PROTIME" in the last 168 hours. Cardiac Enzymes: No results for input(s): "CKTOTAL", "CKMB", "CKMBINDEX", "TROPONINI" in the last 168 hours. BNP (last 3 results) No results for input(s): "PROBNP" in the last 8760 hours. HbA1C: No results for input(s): "HGBA1C" in the last 72 hours. CBG: No results for input(s): "GLUCAP" in the last 168 hours. Lipid Profile: No results for input(s): "CHOL", "HDL", "LDLCALC", "TRIG", "CHOLHDL", "LDLDIRECT" in the last 72 hours. Thyroid Function Tests: No results for input(s): "TSH", "T4TOTAL", "FREET4", "T3FREE", "THYROIDAB" in the last 72 hours. Anemia Panel: No results for input(s): "VITAMINB12", "FOLATE", "FERRITIN", "TIBC", "IRON", "RETICCTPCT" in the last 72 hours. Urine analysis:    Component Value Date/Time   COLORURINE YELLOW 04/18/2021 1825   APPEARANCEUR CLEAR 04/18/2021 1825   LABSPEC 1.010 04/18/2021 1825   PHURINE 5.5 04/18/2021 1825   GLUCOSEU >1,000 (A) 04/18/2021 1825   HGBUR TRACE (A) 04/18/2021 1825   BILIRUBINUR NEGATIVE 04/18/2021 1825   KETONESUR >160 (A) 04/18/2021 1825   PROTEINUR NEGATIVE 04/18/2021 1825   NITRITE NEGATIVE 04/18/2021 1825   LEUKOCYTESUR NEGATIVE 04/18/2021 1825   Sepsis Labs: '@LABRCNTIP'$ (procalcitonin:4,lacticidven:4) )No results found for this or any previous visit (from the past 240 hour(s)).   Radiological Exams on Admission: DG Wrist Complete Right  Result Date:  01/02/2022 CLINICAL DATA:  Postreduction film. Status post reduction of right wrist. EXAM: RIGHT WRIST - COMPLETE 3+ VIEW COMPARISON:  Right wrist radiographs 01/02/2022 FINDINGS: There is new overlying casting material that limits evaluation of fine bony detail. There is improved alignment of the distal radial metadiaphyseal and epiphyseal fracture, now with normal lateral and near anatomic frontal alignment. Minimal medial cortical step-off at the fracture line at the distal femoral metadiaphysis. Likely chronic nonunited ossicles of the ulnar styloid. Mild-to-moderate triscaphe and thumb carpometacarpal joint space narrowing. IMPRESSION: Interval reduction of comminuted distal radial acute fracture with improved, near anatomic alignment. Electronically Signed   By: Yvonne Kendall M.D.   On: 01/02/2022 17:59   DG Chest Port 1 View  Result Date:  01/02/2022 CLINICAL DATA:  Fall, right hip and right wrist fractures. EXAM: PORTABLE CHEST 1 VIEW COMPARISON:  04/18/2021 chest CT and radiograph FINDINGS: Atherosclerotic calcification of the aortic arch. Old healed right posterolateral fourth and fifth rib fractures. Old displaced in nonunited mid clavicular fracture on the left. Old left anterior rib fractures. Subtle irregularity in the right mid clavicle is noted, but this appears relatively similar to 04/18/2021, and accordingly is likely not an acute fracture. Bony demineralization is present. The lungs appear clear. Heart size within normal limits. IMPRESSION: 1. Old bilateral rib fractures and old displaced nonunited left clavicular fracture. 2.  Aortic Atherosclerosis (ICD10-I70.0). Electronically Signed   By: Van Clines M.D.   On: 01/02/2022 15:45   DG Wrist 2 Views Right  Result Date: 01/02/2022 CLINICAL DATA:  Fall, right wrist pain and deformity. EXAM: RIGHT WRIST - 2 VIEW COMPARISON:  None Available. FINDINGS: Distal radial fracture with transverse component in the metaphysis and a longitudinal  component extending to the distal radial articular surface. Deformity of the ulnar styloid with multiple well corticated ossicles, cannot exclude new acute fracture at the base of the ulnar styloid. Degenerative loss of articular space at the first MCP joint. IMPRESSION: 1. Distal radial mildly comminuted fracture with transverse metaphyseal component and longitudinal component extending to the distal radial articular surface. 2. Deformity of the ulnar styloid with multiple well corticated ossicles, cannot exclude acute fracture through the base of the ulnar styloid. 3. Degenerative findings at the first MCP joint. Electronically Signed   By: Van Clines M.D.   On: 01/02/2022 15:41   DG HIP UNILAT WITH PELVIS 2-3 VIEWS RIGHT  Result Date: 01/02/2022 CLINICAL DATA:  Fall.  Right hip pain. EXAM: DG HIP (WITH OR WITHOUT PELVIS) 2-3V RIGHT COMPARISON:  CT scan 04/18/2021 FINDINGS: Intertrochanteric right hip fracture with mild varus angulation. Chronic bone island in the right iliac bone. Bony demineralization.  Vascular calcifications noted. IMPRESSION: 1. Acute right intertrochanteric hip fracture with mild varus angulation. Electronically Signed   By: Van Clines M.D.   On: 01/02/2022 15:39    EKG: Normal sinus rhythm at 90 bpm with normal axis, LBBB and nonspecific ST-T wave changes.   Assessment/Plan  This is a 61 y.o. female with a history of anxiety, depression,  Afib on Eliquis, DM, EtOH with cirrhosis, HTN, GERD, osteoporosis  now being admitted with:  #. R intertrochanteric hip fracture - Admit inpatient - Plan is for OR with Dr. Roland Rack on Saturday 6/17 - NPO after midnight 6/17 - Holding Eliquis tonight will switch to heparin - Will check echo - Pain control  #. R radial fracture reduced and splinted in ER - Further management per ortho  #. History of anxiety and depression - Continue Prozac  #. History of HTN, afib - Continue lisinopril, diltiazem - Hold Eliquis for  now, will switch to heparin  #. History of DM - Continue Lantus - Accuchecks with ISS achs  #. History of GERD - Continue Protonix  Admission status: Inpatient IV Fluids: HL Diet/Nutrition: NPO after midnight Consults called: Dr. Roland Rack ortho  DVT Px: Heparin, SCDs Code Status: Full Code  Disposition Plan: To be determined.  All the records are reviewed and case discussed with ED provider. Management plans discussed with the patient and/or family who express understanding and agree with plan of care.  Michaela Johnson D.O. on 01/02/2022 at 7:14 PM CC: Primary care physician; [name], NP   01/02/2022, 7:14 PM

## 2022-01-03 ENCOUNTER — Inpatient Hospital Stay
Admit: 2022-01-03 | Discharge: 2022-01-03 | Disposition: A | Discharge status: Home / Self Care | Payer: Self-pay | Attending: Family Medicine | Admitting: Family Medicine

## 2022-01-03 DIAGNOSIS — S5291XA Unspecified fracture of right forearm, initial encounter for closed fracture: Secondary | ICD-10-CM | POA: Diagnosis present

## 2022-01-03 LAB — CBC
HCT: 44.5 % (ref 36.0–46.0)
Hemoglobin: 14.5 g/dL (ref 12.0–15.0)
MCH: 32.4 pg (ref 26.0–34.0)
MCHC: 32.6 g/dL (ref 30.0–36.0)
MCV: 99.3 fL (ref 80.0–100.0)
Platelets: 285 10*3/uL (ref 150–400)
RBC: 4.48 MIL/uL (ref 3.87–5.11)
RDW: 11.7 % (ref 11.5–15.5)
WBC: 10.6 10*3/uL — ABNORMAL HIGH (ref 4.0–10.5)
nRBC: 0 % (ref 0.0–0.2)

## 2022-01-03 LAB — BASIC METABOLIC PANEL
Anion gap: 7 (ref 5–15)
BUN: 20 mg/dL (ref 8–23)
CO2: 26 mmol/L (ref 22–32)
Calcium: 9.6 mg/dL (ref 8.9–10.3)
Chloride: 104 mmol/L (ref 98–111)
Creatinine, Ser: 0.48 mg/dL (ref 0.44–1.00)
GFR, Estimated: >60 mL/min (ref >60)
Glucose, Bld: 100 mg/dL — ABNORMAL HIGH (ref 70–99)
Potassium: 4.2 mmol/L (ref 3.5–5.1)
Sodium: 137 mmol/L (ref 135–145)

## 2022-01-03 LAB — GLUCOSE, CAPILLARY
Glucose-Capillary: 92 mg/dL (ref 70–99)
Glucose-Capillary: 225 mg/dL — ABNORMAL HIGH (ref 70–99)
Glucose-Capillary: 260 mg/dL — ABNORMAL HIGH (ref 70–99)
Glucose-Capillary: 208 mg/dL — ABNORMAL HIGH (ref 70–99)

## 2022-01-03 LAB — ECHOCARDIOGRAM COMPLETE
AR max vel: 2.62 cm2
AV Area VTI: 2.91 cm2
AV Area mean vel: 2.66 cm2
AV Mean grad: 3.5 mmHg
AV Peak grad: 6.6 mmHg
Ao pk vel: 1.29 m/s
Area-P 1/2: 5.62 cm2
Height: 62 in
MV VTI: 3.47 cm2
S' Lateral: 2.4 cm
Weight: 2000 oz

## 2022-01-03 LAB — ETHANOL: Alcohol, Ethyl (B): <10 mg/dL (ref <10)

## 2022-01-03 LAB — HEMOGLOBIN A1C
Hgb A1c MFr Bld: 8.4 % — ABNORMAL HIGH (ref 4.8–5.6)
Mean Plasma Glucose: 194.38 mg/dL

## 2022-01-03 LAB — PROTIME-INR
INR: 1 (ref 0.8–1.2)
Prothrombin Time: 12.8 seconds (ref 11.4–15.2)

## 2022-01-03 MED ORDER — HYDROMORPHONE HCL 1 MG/ML IJ SOLN: 1.0000 mg | INTRAMUSCULAR | Status: DC | PRN

## 2022-01-03 MED ORDER — CEFAZOLIN SODIUM-DEXTROSE 2-4 GM/100ML-% IV SOLN: 2.0000 g | INTRAVENOUS | Status: DC

## 2022-01-03 MED ORDER — ADULT MULTIVITAMIN W/MINERALS CH
1.0000 | ORAL_TABLET | Freq: Every day | ORAL | Status: DC
  Administered 2022-01-05 – 2022-01-07 (×3): 1 via ORAL
  Filled 2022-01-03 (×5): qty 1

## 2022-01-03 MED ORDER — FENTANYL CITRATE PF 50 MCG/ML IJ SOSY
25.0000 ug | PREFILLED_SYRINGE | Freq: Once | INTRAMUSCULAR | Status: AC
  Administered 2022-01-03: 25 ug via INTRAVENOUS
  Filled 2022-01-03: qty 1

## 2022-01-03 MED ORDER — HYDROMORPHONE HCL 1 MG/ML IJ SOLN
0.5000 mg | INTRAMUSCULAR | Status: DC | PRN
  Administered 2022-01-03 – 2022-01-06 (×5): 0.5 mg via INTRAVENOUS
  Filled 2022-01-03 (×5): qty 1

## 2022-01-03 MED ORDER — INSULIN GLARGINE-YFGN 100 UNIT/ML ~~LOC~~ SOLN
20.0000 [IU] | Freq: Two times a day (BID) | SUBCUTANEOUS | Status: DC
  Administered 2022-01-03 – 2022-01-05 (×3): 20 [IU] via SUBCUTANEOUS
  Filled 2022-01-03 (×4): qty 0.2

## 2022-01-03 MED ORDER — ENSURE ENLIVE PO LIQD
237.0000 mL | Freq: Three times a day (TID) | ORAL | Status: DC
  Administered 2022-01-03 – 2022-01-04 (×3): 237 mL via ORAL

## 2022-01-03 NOTE — Assessment & Plan Note (Signed)
Seems uncontrolled with hyperglycemia and A1c of 8.4. Patient was on Jardiance and Lantus at home -Changed Semglee to 20 units twice daily -Continue with SSI

## 2022-01-03 NOTE — Assessment & Plan Note (Signed)
S/p reduction and splint placement. -PT/OT evaluation -Continue with pain management

## 2022-01-03 NOTE — Assessment & Plan Note (Signed)
Ethanol levels less than 10. -Monitor with CIWA

## 2022-01-03 NOTE — Hospital Course (Addendum)
Taken from H&P.  Michaela Johnson is a 61 y.o. female with a known history of anxiety, depression,  Afib on Xarelto, DM, EtOH with cirrhosis, HTN, GERD, osteoporosis presents to the emergency department for evaluation of right hip pain s/p mechanical fall this afternoon.  Patient was knocked over by her dog landed on her right hip and right arm.  She complains of right wrist and forearm pain as well as right hip pain.  She denies any preceding symptoms like dizziness, lightheadedness, loss of consciousness.    Patient denies fevers/chills, weakness, dizziness, chest pain, shortness of breath, N/V/C/D, abdominal pain, dysuria/frequency, changes in mental status.   She was found to have right intertrochanteric hip fracture and right radius fracture. Right radial fracture was reduced and splinted in ED. Orthopedic is on board and she will be going to the OR tomorrow, surgery was delayed due to her Xarelto use  6/16: Patient continued to have significant pain involving right upper and lower extremities. Labs pretty much unremarkable, A1c of 8.4, ethanol levels were less than 10. Patient most likely need SNF after the surgery-will be a difficult disposition as she does not have insurance.  6/17: Patient underwent ORIF today.  Tolerated the procedure well.  6/18: No acute overnight event.  Restarting home Xarelto.  CBC with decrease of hemoglobin to 10 from 14.  Starting her on iron supplement.  CBG remained elevated-adjusting insulin  6/19: Patient remained stable, hemoglobin with some increased to 10.8.  PT is recommending rehab but patient does not have any insurance-TOC to help with displacement. CBG remained elevated although some improvement than before, continue to adjust insulin. Will be a very difficult placement per TOC. Discussed with patient and she can arrange some family or close friends to help her. We will try getting charitable home health services and potential discharge tomorrow if  remains stable.  6/20: Patient remained stable.  1 episode of mild hypoglycemia after dinner with CBG of 75 noted, CBG elevated this morning.  Decreasing the mealtime coverage to 6 units. Home health services were arranged with charity. Patient does not want to involve her family, encouraged him to discuss with a friend or family member so they can help you at home.  Patient need supervision and help, unfortunately we cannot get her to a rehab place due to insurance issues.  Patient is being discharged home with home health services and will follow-up with her primary care provider and orthopedic surgery.  Patient will continue current medications and follow-up with her providers.

## 2022-01-03 NOTE — Assessment & Plan Note (Signed)
-   Continue with home Neurontin

## 2022-01-03 NOTE — Assessment & Plan Note (Signed)
Blood pressure mildly elevated. -Continue home lisinopril and Cardizem

## 2022-01-03 NOTE — Progress Notes (Signed)
*  PRELIMINARY RESULTS* Echocardiogram 2D Echocardiogram has been performed.  Michaela Johnson 01/03/2022, 11:39 AM

## 2022-01-03 NOTE — Assessment & Plan Note (Signed)
Nicotine patch as needed °

## 2022-01-03 NOTE — Assessment & Plan Note (Signed)
Orthopedic was consulted and she will be going to the OR on Saturday, 6/17. Eliquis is being held and she was placed on heparin. Echocardiogram was ordered by admitting provider for risk stratification-done-pending results -Continue with pain management. -Continue with supportive care

## 2022-01-03 NOTE — Progress Notes (Signed)
Progress Note   Patient: Michaela Johnson MEQ:[id number] DOB: 1961/07/04 DOA: 01/02/2022     1 DOS: the patient was seen and examined on 01/03/2022   Brief hospital course: Taken from H&P.  Doreene Forrey is a 61 y.o. female with a known history of anxiety, depression,  Afib on Xarelto, DM, EtOH with cirrhosis, HTN, GERD, osteoporosis presents to the emergency department for evaluation of right hip pain s/p mechanical fall this afternoon.  Patient was knocked over by her dog landed on her right hip and right arm.  She complains of right wrist and forearm pain as well as right hip pain.  She denies any preceding symptoms like dizziness, lightheadedness, loss of consciousness.    Patient denies fevers/chills, weakness, dizziness, chest pain, shortness of breath, N/V/C/D, abdominal pain, dysuria/frequency, changes in mental status.   She was found to have right intertrochanteric hip fracture and right radius fracture. Right radial fracture was reduced and splinted in ED. Orthopedic is on board and she will be going to the OR tomorrow, surgery was delayed due to her Xarelto use  6/16: Patient continued to have significant pain involving right upper and lower extremities. Labs pretty much unremarkable, A1c of 8.4, ethanol levels were less than 10. Patient most likely need SNF after the surgery-will be a difficult disposition as she does not have insurance.      Assessment and Plan: * Closed intertrochanteric fracture of hip, right, initial encounter [facility]) Orthopedic was consulted and she will be going to the OR on Saturday, 6/17. Eliquis is being held and she was placed on heparin. Echocardiogram was ordered by admitting provider for risk stratification-done-pending results -Continue with pain management. -Continue with supportive care  Closed right radial fracture S/p reduction and splint placement. -PT/OT evaluation -Continue with pain management  PAF (paroxysmal atrial fibrillation)  (HCC) Rate well controlled. -Continue diltiazem. -Home Xarelto is being switched with heparin for surgery  Atrial fibrillation with rapid ventricular response (HCC)-resolved as of 01/03/2022 Rate well controlled. -Continue with diltiazem -Eliquis was switched with heparin  Diabetes ([place]) Seems uncontrolled with hyperglycemia and A1c of 8.4. Patient was on Jardiance and Lantus at home -Changed Semglee to 20 units twice daily -Continue with SSI  Hypertension associated with diabetes ([place]) Blood pressure mildly elevated. -Continue home lisinopril and Cardizem  Chronic alcohol use Ethanol levels less than 10. -Monitor with CIWA  Nicotine dependence - Nicotine patch as needed  Neuropathy - Continue with home Neurontin      Subjective: Patient was seen and examined during morning rounds.  She was getting her echocardiogram.  Complaining of right upper and lower extremity pain.  Physical Exam: Vitals:   01/02/22 2016 01/02/22 2347 01/03/22 0424 01/03/22 0744  BP: (!) 148/82 121/68 138/74 (!) 141/77  Pulse: 96 95 96 99  Resp: '14 16 16 16  '$ Temp: 98.7 F (37.1 C) 98.8 F (37.1 C) 98.3 F (36.8 C) 99 F (37.2 C)  TempSrc: Oral Oral Oral   SpO2: 97% 95% 96% 100%  Weight:      Height:       General.  Frail and malnourished lady, in no acute distress.  Appears older than stated age Pulmonary.  Lungs clear bilaterally, normal respiratory effort. CV.  Regular rate and rhythm, no JVD, rub or murmur. Abdomen.  Soft, nontender, nondistended, BS positive. CNS.  Alert and oriented .  No focal neurologic deficit. Extremities.  No edema, no cyanosis, pulses intact and symmetrical.  Right upper extremity with splint. Psychiatry.  Judgment and  insight appears normal.  Data Reviewed: Prior notes, labs and images reviewed  Family Communication: Discussed with patient  Disposition: Status is: Inpatient Remains inpatient appropriate because: Severity of illness   Planned  Discharge Destination: To be determined  DVT prophylaxis.  Heparin Time spent: 50 minutes  This record has been created using Systems analyst. Errors have been sought and corrected,but may not always be located. Such creation errors do not reflect on the standard of care.  Author: Lorella Nimrod, MD 01/03/2022 3:38 PM  For on call review [email or web address].

## 2022-01-03 NOTE — Assessment & Plan Note (Signed)
Rate well controlled. -Continue with diltiazem -Eliquis was switched with heparin

## 2022-01-03 NOTE — Assessment & Plan Note (Addendum)
Rate well controlled. -Continue diltiazem. -Home Xarelto is being switched with heparin for surgery

## 2022-01-03 NOTE — Progress Notes (Signed)
Initial Nutrition Assessment  DOCUMENTATION CODES:   Not applicable  INTERVENTION:   -Ensure Enlive po TID, each supplement provides 350 kcal and 20 grams of protein -MVI with minerals daily  NUTRITION DIAGNOSIS:   Increased nutrient needs related to post-op healing as evidenced by estimated needs.  GOAL:   Patient will meet greater than or equal to 90% of their needs  MONITOR:   PO intake, Supplement acceptance  REASON FOR ASSESSMENT:   Consult Assessment of nutrition requirement/status, Hip fracture protocol  ASSESSMENT:   Pt with a known history of anxiety, depression,  Afib on Eliquis, DM, EtOH with cirrhosis, HTN, GERD, osteoporosis presents for evaluation of right hip pain s/p mechanical fall.  Pt admitted with rt hip fracture.   Reviewed I/O's: +240 ml x 24 hours  UOP: 250 ml x 24 hours   Pt also with radial fracture that was splinted in the ED. Plan for OR on Saturday, 01/04/22.   Spoke with pt at bedside, who reports decreased oral intake today secondary to nausea. Noted meal completions documented at 10%. Pt reports she has a fair appetite at baseline, consuming 3 meals per day (Breakfast: oatmeal, Lunch: sandwich, Dinner: meat, starch and vegetable).   Reviewed wt hx; pt has experienced a 9.9% wt loss over the past 8 months. WHile this is not significant for time frame, it is concerning given history of ETOH abuse with cirrhosis. Pt reports her UBW is around 140# and estimates she has lost about 20 pounds over the past year as a result of separating from her husband.   Discussed importance of good meal and supplement intake to promote healing. Pt amenable to supplements.   Pt with poor oral intake and would benefit from nutrient dense supplement. One Ensure Enlive supplement provides 350 kcals, 20 grams protein, and 44-45 grams of carbohydrate vs one Glucerna shake supplement, which provides 220 kcals, 10 grams of protein, and 26 grams of carbohydrate. Given  pt's hx of DM, RD will reassess adequacy of PO intake, CBGS, and adjust supplement regimen as appropriate at follow-up.    Medications reviewed and include vitamin D3, cardizem, and colace.   Lab Results  Component Value Date   HGBA1C 8.4 (H) 01/02/2022   PTA DM medications are 4 units insulin aspart TID with meals and 37 units lantus solostar daily.   Labs reviewed: CBGS: 92-211 (inpatient orders for glycemic control are 0-15 units insulin aspart TID before meals and at bedtime and 36 units insulin glargine-yfgn daily).    NUTRITION - FOCUSED PHYSICAL EXAM:  Flowsheet Row Most Recent Value  Orbital Region No depletion  Upper Arm Region No depletion  Thoracic and Lumbar Region No depletion  Buccal Region No depletion  Temple Region No depletion  Clavicle Bone Region No depletion  Clavicle and Acromion Bone Region No depletion  Scapular Bone Region No depletion  Dorsal Hand Mild depletion  Patellar Region Mild depletion  Anterior Thigh Region Mild depletion  Posterior Calf Region Mild depletion  Edema (RD Assessment) None  Hair Reviewed  Eyes Reviewed  Mouth Reviewed  Skin Reviewed  Nails Reviewed       Diet Order:   Diet Order             Diet Carb Modified Fluid consistency: Thin; Room service appropriate? Yes  Diet effective now                   EDUCATION NEEDS:   Education needs have been addressed  Skin:  Skin Assessment: Reviewed RN Assessment  Last BM:  01/01/22  Height:   Ht Readings from Last 1 Encounters:  01/02/22 '5\' 2"'$  (1.575 m)    Weight:   Wt Readings from Last 1 Encounters:  01/02/22 56.7 kg    Ideal Body Weight:  50 kg  BMI:  Body mass index is 22.86 kg/m.  Estimated Nutritional Needs:   Kcal:  1700-1900  Protein:  85-100 grams  Fluid:  > 1.7 L    Loistine Chance, RD, LDN, [place] Registered Dietitian II Certified Diabetes Care and Education Specialist Please refer to [facility] for RD and/or RD on-call/weekend/after hours pager

## 2022-01-04 ENCOUNTER — Encounter
Admission: EM | Disposition: A | Discharge status: Home / Self Care | Payer: Self-pay | Source: Home / Self Care | Attending: Internal Medicine

## 2022-01-04 ENCOUNTER — Inpatient Hospital Stay: Payer: Self-pay | Admitting: Anesthesiology

## 2022-01-04 ENCOUNTER — Encounter: Payer: Self-pay | Admitting: Family Medicine

## 2022-01-04 ENCOUNTER — Other Ambulatory Visit: Payer: Self-pay

## 2022-01-04 ENCOUNTER — Inpatient Hospital Stay: Payer: Self-pay

## 2022-01-04 HISTORY — PX: INTRAMEDULLARY (IM) NAIL INTERTROCHANTERIC: SHX5875

## 2022-01-04 LAB — GLUCOSE, CAPILLARY
Glucose-Capillary: 198 mg/dL — ABNORMAL HIGH (ref 70–99)
Glucose-Capillary: 394 mg/dL — ABNORMAL HIGH (ref 70–99)
Glucose-Capillary: 326 mg/dL — ABNORMAL HIGH (ref 70–99)

## 2022-01-04 SURGERY — FIXATION, FRACTURE, INTERTROCHANTERIC, WITH INTRAMEDULLARY ROD
Anesthesia: General | Site: Hip | Laterality: Right

## 2022-01-04 MED ORDER — PHENYLEPHRINE HCL (PRESSORS) 10 MG/ML IV SOLN
INTRAVENOUS | Status: DC | PRN
  Administered 2022-01-04 (×6): 80 ug via INTRAVENOUS

## 2022-01-04 MED ORDER — LIDOCAINE HCL (CARDIAC) PF 100 MG/5ML IV SOSY
PREFILLED_SYRINGE | INTRAVENOUS | Status: DC | PRN
  Administered 2022-01-04: 60 mg via INTRAVENOUS

## 2022-01-04 MED ORDER — ONDANSETRON HCL 4 MG/2ML IJ SOLN
4.0000 mg | Freq: Once | INTRAMUSCULAR | Status: AC | PRN
  Administered 2022-01-04: 4 mg via INTRAVENOUS

## 2022-01-04 MED ORDER — ONDANSETRON HCL 4 MG/2ML IJ SOLN: 4.0000 mg | Freq: Four times a day (QID) | INTRAMUSCULAR | Status: DC | PRN

## 2022-01-04 MED ORDER — 0.9 % SODIUM CHLORIDE (POUR BTL) OPTIME
TOPICAL | Status: DC | PRN
  Administered 2022-01-04: 500 mL

## 2022-01-04 MED ORDER — DIPHENHYDRAMINE HCL 12.5 MG/5ML PO ELIX: 12.5000 mg | ORAL_SOLUTION | ORAL | Status: DC | PRN

## 2022-01-04 MED ORDER — LACTATED RINGERS IV SOLN: INTRAVENOUS | Status: DC | PRN

## 2022-01-04 MED ORDER — KETOROLAC TROMETHAMINE 15 MG/ML IJ SOLN
INTRAMUSCULAR | Status: AC
  Filled 2022-01-04: qty 1

## 2022-01-04 MED ORDER — FLEET ENEMA 7-19 GM/118ML RE ENEM: 1.0000 | ENEMA | Freq: Once | RECTAL | Status: DC | PRN

## 2022-01-04 MED ORDER — CEFAZOLIN SODIUM-DEXTROSE 2-4 GM/100ML-% IV SOLN: 2.0000 g | INTRAVENOUS | Status: DC

## 2022-01-04 MED ORDER — SODIUM CHLORIDE 0.9 % IV SOLN: INTRAVENOUS | Status: DC | PRN

## 2022-01-04 MED ORDER — ACETAMINOPHEN 500 MG PO TABS
1000.0000 mg | ORAL_TABLET | Freq: Four times a day (QID) | ORAL | Status: AC
  Administered 2022-01-04 – 2022-01-05 (×4): 1000 mg via ORAL
  Filled 2022-01-04 (×4): qty 2

## 2022-01-04 MED ORDER — CEFAZOLIN SODIUM-DEXTROSE 2-4 GM/100ML-% IV SOLN
2.0000 g | Freq: Four times a day (QID) | INTRAVENOUS | Status: AC
  Administered 2022-01-04 – 2022-01-05 (×3): 2 g via INTRAVENOUS
  Filled 2022-01-04 (×3): qty 100

## 2022-01-04 MED ORDER — METOCLOPRAMIDE HCL 5 MG/ML IJ SOLN: 5.0000 mg | Freq: Three times a day (TID) | INTRAMUSCULAR | Status: DC | PRN

## 2022-01-04 MED ORDER — OXYCODONE HCL 5 MG PO TABS
5.0000 mg | ORAL_TABLET | ORAL | Status: DC | PRN
  Administered 2022-01-05 – 2022-01-07 (×5): 10 mg via ORAL
  Administered 2022-01-07: 5 mg via ORAL
  Administered 2022-01-07: 10 mg via ORAL
  Filled 2022-01-04: qty 1
  Filled 2022-01-04 (×3): qty 2
  Filled 2022-01-04 (×2): qty 1
  Filled 2022-01-04 (×2): qty 2

## 2022-01-04 MED ORDER — DOCUSATE SODIUM 100 MG PO CAPS
100.0000 mg | ORAL_CAPSULE | Freq: Two times a day (BID) | ORAL | Status: DC
  Administered 2022-01-04 – 2022-01-07 (×7): 100 mg via ORAL
  Filled 2022-01-04 (×7): qty 1

## 2022-01-04 MED ORDER — GLYCOPYRROLATE 0.2 MG/ML IJ SOLN
INTRAMUSCULAR | Status: DC | PRN
  Administered 2022-01-04: .1 mg via INTRAVENOUS

## 2022-01-04 MED ORDER — KETOROLAC TROMETHAMINE 15 MG/ML IJ SOLN
15.0000 mg | Freq: Once | INTRAMUSCULAR | Status: AC
  Administered 2022-01-04: 15 mg via INTRAVENOUS

## 2022-01-04 MED ORDER — RIVAROXABAN 20 MG PO TABS
20.0000 mg | ORAL_TABLET | Freq: Every day | ORAL | Status: DC
  Administered 2022-01-05 – 2022-01-06 (×2): 20 mg via ORAL
  Filled 2022-01-04 (×3): qty 1

## 2022-01-04 MED ORDER — FENTANYL CITRATE (PF) 250 MCG/5ML IJ SOLN
INTRAMUSCULAR | Status: AC
  Filled 2022-01-04: qty 5

## 2022-01-04 MED ORDER — ONDANSETRON HCL 4 MG PO TABS: 4.0000 mg | ORAL_TABLET | Freq: Four times a day (QID) | ORAL | Status: DC | PRN

## 2022-01-04 MED ORDER — BUPIVACAINE-EPINEPHRINE (PF) 0.5% -1:200000 IJ SOLN
INTRAMUSCULAR | Status: DC | PRN
  Administered 2022-01-04: 30 mL via PERINEURAL

## 2022-01-04 MED ORDER — MIDAZOLAM HCL 2 MG/2ML IJ SOLN
INTRAMUSCULAR | Status: AC
  Filled 2022-01-04: qty 2

## 2022-01-04 MED ORDER — CEFAZOLIN SODIUM-DEXTROSE 2-3 GM-%(50ML) IV SOLR
INTRAVENOUS | Status: DC | PRN
  Administered 2022-01-04: 2 g via INTRAVENOUS

## 2022-01-04 MED ORDER — MAGNESIUM HYDROXIDE 400 MG/5ML PO SUSP
30.0000 mL | Freq: Every day | ORAL | Status: DC | PRN
  Administered 2022-01-05: 30 mL via ORAL
  Filled 2022-01-04: qty 30

## 2022-01-04 MED ORDER — PROPOFOL 10 MG/ML IV BOLUS
INTRAVENOUS | Status: AC
  Filled 2022-01-04: qty 20

## 2022-01-04 MED ORDER — EPHEDRINE SULFATE (PRESSORS) 50 MG/ML IJ SOLN
INTRAMUSCULAR | Status: DC | PRN
  Administered 2022-01-04 (×2): 10 mg via INTRAVENOUS

## 2022-01-04 MED ORDER — METOCLOPRAMIDE HCL 5 MG PO TABS: 5.0000 mg | ORAL_TABLET | Freq: Three times a day (TID) | ORAL | Status: DC | PRN

## 2022-01-04 MED ORDER — PROPOFOL 10 MG/ML IV BOLUS
INTRAVENOUS | Status: DC | PRN
  Administered 2022-01-04: 150 mg via INTRAVENOUS

## 2022-01-04 MED ORDER — KETOROLAC TROMETHAMINE 15 MG/ML IJ SOLN
7.5000 mg | Freq: Four times a day (QID) | INTRAMUSCULAR | Status: AC
  Administered 2022-01-04 – 2022-01-05 (×4): 7.5 mg via INTRAVENOUS
  Filled 2022-01-04 (×4): qty 1

## 2022-01-04 MED ORDER — FENTANYL CITRATE (PF) 100 MCG/2ML IJ SOLN
INTRAMUSCULAR | Status: DC | PRN
  Administered 2022-01-04 (×2): 50 ug via INTRAVENOUS

## 2022-01-04 MED ORDER — FENTANYL CITRATE (PF) 100 MCG/2ML IJ SOLN
INTRAMUSCULAR | Status: AC
  Administered 2022-01-04: 25 ug via INTRAVENOUS
  Filled 2022-01-04: qty 2

## 2022-01-04 MED ORDER — ROCURONIUM BROMIDE 10 MG/ML (PF) SYRINGE
PREFILLED_SYRINGE | INTRAVENOUS | Status: AC
  Filled 2022-01-04: qty 10

## 2022-01-04 MED ORDER — BISACODYL 10 MG RE SUPP: 10.0000 mg | Freq: Every day | RECTAL | Status: DC | PRN

## 2022-01-04 MED ORDER — DEXAMETHASONE SODIUM PHOSPHATE 10 MG/ML IJ SOLN
INTRAMUSCULAR | Status: DC | PRN
  Administered 2022-01-04: 4 mg via INTRAVENOUS

## 2022-01-04 MED ORDER — SODIUM CHLORIDE 0.9 % IV SOLN: INTRAVENOUS | Status: DC

## 2022-01-04 MED ORDER — FENTANYL CITRATE (PF) 100 MCG/2ML IJ SOLN
25.0000 ug | INTRAMUSCULAR | Status: DC | PRN
  Administered 2022-01-04: 25 ug via INTRAVENOUS

## 2022-01-04 MED ORDER — ACETAMINOPHEN 325 MG PO TABS: 325.0000 mg | ORAL_TABLET | Freq: Four times a day (QID) | ORAL | Status: DC | PRN

## 2022-01-04 SURGICAL SUPPLY — 62 items
APL PRP STRL LF DISP 70% ISPRP (MISCELLANEOUS) ×4
BIT DRILL CROWE POINT TWST 4.3 (DRILL) ×1 IMPLANT
BNDG CMPR 5X6 CHSV STRCH STRL (GAUZE/BANDAGES/DRESSINGS) ×2
BNDG COHESIVE 4X5 TAN ST LF (GAUZE/BANDAGES/DRESSINGS) ×4 IMPLANT
BNDG COHESIVE 6X5 TAN ST LF (GAUZE/BANDAGES/DRESSINGS) ×4 IMPLANT
BNDG ELASTIC 4X5.8 VLCR STR LF (GAUZE/BANDAGES/DRESSINGS) ×4 IMPLANT
CHLORAPREP W/TINT 26 (MISCELLANEOUS) ×8 IMPLANT
CORD BIP STRL DISP 12FT (MISCELLANEOUS) ×4 IMPLANT
DRAPE 3/4 80X56 (DRAPES) ×4 IMPLANT
DRAPE C-ARMOR (DRAPES) ×4 IMPLANT
DRAPE ORTHO SPLIT 77X108 STRL (DRAPES) ×3
DRAPE SURG 17X11 SM STRL (DRAPES) ×4 IMPLANT
DRAPE SURG ORHT 6 SPLT 77X108 (DRAPES) ×3 IMPLANT
DRAPE U-SHAPE 47X51 STRL (DRAPES) ×4 IMPLANT
DRILL CROWE POINT TWIST 4.3 (DRILL) ×3
DRSG OPSITE POSTOP 3X4 (GAUZE/BANDAGES/DRESSINGS) ×4 IMPLANT
DRSG OPSITE POSTOP 4X6 (GAUZE/BANDAGES/DRESSINGS) IMPLANT
ELECT CAUTERY BLADE 6.4 (BLADE) ×4 IMPLANT
ELECT REM PT RETURN 9FT ADLT (ELECTROSURGICAL) ×3
ELECTRODE REM PT RTRN 9FT ADLT (ELECTROSURGICAL) ×3 IMPLANT
FORCEPS JEWEL BIP 4-3/4 STR (INSTRUMENTS) ×4 IMPLANT
GAUZE SPONGE 4X4 12PLY STRL (GAUZE/BANDAGES/DRESSINGS) ×4 IMPLANT
GAUZE XEROFORM 1X8 LF (GAUZE/BANDAGES/DRESSINGS) ×4 IMPLANT
GLOVE BIO SURGEON STRL SZ8 (GLOVE) ×8 IMPLANT
GLOVE SURG UNDER LTX SZ8 (GLOVE) ×4 IMPLANT
GOWN STRL REUS W/ TWL LRG LVL3 (GOWN DISPOSABLE) ×3 IMPLANT
GOWN STRL REUS W/ TWL XL LVL3 (GOWN DISPOSABLE) ×3 IMPLANT
GOWN STRL REUS W/TWL LRG LVL3 (GOWN DISPOSABLE) ×3
GOWN STRL REUS W/TWL XL LVL3 (GOWN DISPOSABLE) ×3
GUIDEPIN VERSANAIL DSP 3.2X444 (ORTHOPEDIC DISPOSABLE SUPPLIES) ×2 IMPLANT
GUIDEWIRE BALL NOSE 80CM (WIRE) ×2 IMPLANT
HFN LAG SCREW 10.5MM X 85MM (Screw) ×2 IMPLANT
HFN RH 130 DEG 9MM X 360MM (Nail) ×2 IMPLANT
KIT TURNOVER KIT A (KITS) ×4 IMPLANT
MANIFOLD NEPTUNE II (INSTRUMENTS) ×4 IMPLANT
MAT ABSORB  FLUID 56X50 GRAY (MISCELLANEOUS) ×3
MAT ABSORB FLUID 56X50 GRAY (MISCELLANEOUS) ×3 IMPLANT
NEEDLE HYPO 22GX1.5 SAFETY (NEEDLE) ×4 IMPLANT
NS IRRIG 500ML POUR BTL (IV SOLUTION) ×4 IMPLANT
PACK EXTREMITY ARMC (MISCELLANEOUS) ×4 IMPLANT
PACK HIP COMPR (MISCELLANEOUS) ×4 IMPLANT
PADDING CAST 4IN STRL (MISCELLANEOUS) ×2
PADDING CAST BLEND 4X4 STRL (MISCELLANEOUS) ×6 IMPLANT
SCREW BONE CORTICAL 5.0X32 (Screw) ×2 IMPLANT
SCREWDRIVER HEX TIP 3.5MM (MISCELLANEOUS) ×2 IMPLANT
SPLINT CAST 1 STEP 4X15 (MISCELLANEOUS) IMPLANT
STAPLER SKIN PROX 35W (STAPLE) ×4 IMPLANT
STRAP SAFETY 5IN WIDE (MISCELLANEOUS) ×4 IMPLANT
STRIP CLOSURE SKIN 1/4X4 (GAUZE/BANDAGES/DRESSINGS) IMPLANT
SUT PROLENE 4 0 PS 2 18 (SUTURE) ×4 IMPLANT
SUT VIC AB 0 CT1 36 (SUTURE) ×4 IMPLANT
SUT VIC AB 1 CT1 36 (SUTURE) ×4 IMPLANT
SUT VIC AB 2-0 CT1 (SUTURE) ×8 IMPLANT
SUT VIC AB 2-0 SH 27 (SUTURE) ×3
SUT VIC AB 2-0 SH 27XBRD (SUTURE) ×3 IMPLANT
SUT VIC AB 3-0 SH 27 (SUTURE) ×3
SUT VIC AB 3-0 SH 27X BRD (SUTURE) ×3 IMPLANT
SWABSTK COMLB BENZOIN TINCTURE (MISCELLANEOUS) IMPLANT
SYR 10ML LL (SYRINGE) ×4 IMPLANT
SYR 30ML LL (SYRINGE) ×4 IMPLANT
TAPE MICROFOAM 4IN (TAPE) ×4 IMPLANT
WATER STERILE IRR 1000ML POUR (IV SOLUTION) ×4 IMPLANT

## 2022-01-04 NOTE — Anesthesia Procedure Notes (Signed)
Procedure Name: LMA Insertion Date/Time: 01/04/2022 7:53 AM  Performed by: Orion Crook, CRNAPre-anesthesia Checklist: Patient identified, Emergency Drugs available, Suction available, Patient being monitored and Timeout performed Patient Re-evaluated:Patient Re-evaluated prior to induction Oxygen Delivery Method: Circle system utilized Preoxygenation: Pre-oxygenation with 100% oxygen Induction Type: IV induction Ventilation: Mask ventilation without difficulty LMA: LMA inserted LMA Size: 4.0 Number of attempts: 1 Placement Confirmation: positive ETCO2 and breath sounds checked- equal and bilateral

## 2022-01-04 NOTE — Assessment & Plan Note (Signed)
S/p reduction and splint placement. -PT/OT evaluation -Continue with pain management

## 2022-01-04 NOTE — Progress Notes (Signed)
CBG 146. Info not transferring over to EMR.

## 2022-01-04 NOTE — Progress Notes (Signed)
Progress Note   Patient: Michaela Johnson EXH:[id number] DOB: 1961/02/10 DOA: 01/02/2022     2 DOS: the patient was seen and examined on 01/04/2022   Brief hospital course: Taken from H&P.  Michaela Johnson is a 61 y.o. female with a known history of anxiety, depression,  Afib on Xarelto, DM, EtOH with cirrhosis, HTN, GERD, osteoporosis presents to the emergency department for evaluation of right hip pain s/p mechanical fall this afternoon.  Patient was knocked over by her dog landed on her right hip and right arm.  She complains of right wrist and forearm pain as well as right hip pain.  She denies any preceding symptoms like dizziness, lightheadedness, loss of consciousness.    Patient denies fevers/chills, weakness, dizziness, chest pain, shortness of breath, N/V/C/D, abdominal pain, dysuria/frequency, changes in mental status.   She was found to have right intertrochanteric hip fracture and right radius fracture. Right radial fracture was reduced and splinted in ED. Orthopedic is on board and she will be going to the OR tomorrow, surgery was delayed due to her Xarelto use  6/16: Patient continued to have significant pain involving right upper and lower extremities. Labs pretty much unremarkable, A1c of 8.4, ethanol levels were less than 10. Patient most likely need SNF after the surgery-will be a difficult disposition as she does not have insurance.  6/17: Patient underwent ORIF today.  Tolerated the procedure well.    Assessment and Plan: * Closed intertrochanteric fracture of hip, right, initial encounter [facility]) Orthopedic was consulted and she went to the OR with orthopedic this morning s/p ORIF.  Tolerated the procedure well Xarelto is being held and she was placed on heparin. Echocardiogram was ordered by admitting provider for risk stratification-was with normal EF and grade 1 diastolic dysfunction. -Continue with pain management. -Continue with supportive care  Closed right radial  fracture S/p reduction and splint placement. -PT/OT evaluation -Continue with pain management  PAF (paroxysmal atrial fibrillation) (HCC) Rate well controlled. -Continue diltiazem. -Home Xarelto is being switched with heparin for surgery  Atrial fibrillation with rapid ventricular response (HCC)-resolved as of 01/03/2022 Rate well controlled. -Continue with diltiazem -Eliquis was switched with heparin  Diabetes ([place]) Seems uncontrolled with hyperglycemia and A1c of 8.4. Patient was on Jardiance and Lantus at home -Changed Semglee to 20 units twice daily -Continue with SSI  Hypertension associated with diabetes ([place]) Blood pressure mildly elevated. -Continue home lisinopril and Cardizem  Chronic alcohol use Ethanol levels less than 10. -Monitor with CIWA  Nicotine dependence - Nicotine patch as needed  Neuropathy - Continue with home Neurontin    Subjective: Patient was seen and examined after the surgery today.  She was denying any pain and has received pain medications before transfer from PACU.  Physical Exam: Vitals:   01/04/22 0945 01/04/22 1000 01/04/22 1015 01/04/22 1044  BP: (!) 142/76 (!) 144/70 137/69 138/72  Pulse: (!) 105 (!) 103 (!) 104 (!) 107  Resp: '13 12 12 16  '$ Temp:  (!) 96.9 F (36.1 C)  97.9 F (36.6 C)  TempSrc:    Oral  SpO2: 99% 100% 100% 100%  Weight:      Height:       General.  Malnourished lady, in no acute distress. Pulmonary.  Lungs clear bilaterally, normal respiratory effort. CV.  Regular rate and rhythm, no JVD, rub or murmur. Abdomen.  Soft, nontender, nondistended, BS positive. CNS.  Alert and oriented .  No focal neurologic deficit. Extremities.  Right upper extremity with splint, left  hip with clean bandage Psychiatry.  Judgment and insight appears normal.  Data Reviewed: Prior notes, labs and images reviewed  Family Communication: Discussed with patient  Disposition: Status is: Inpatient Remains inpatient appropriate  because: Severity of illness   Planned Discharge Destination: To be determined.  DVT prophylaxis.  Xarelto Time spent: 45 minutes  This record has been created using Systems analyst. Errors have been sought and corrected,but may not always be located. Such creation errors do not reflect on the standard of care.  Author: Lorella Nimrod, MD 01/04/2022 3:14 PM  For on call review [email or web address].

## 2022-01-04 NOTE — OR Nursing (Signed)
MD states to hold Heparin. Floor Rn notified and aware to hold dose.

## 2022-01-04 NOTE — Anesthesia Postprocedure Evaluation (Signed)
Anesthesia Post Note  Patient: Michaela Johnson  Procedure(s) Performed: INTRAMEDULLARY (IM) NAIL INTERTROCHANTRIC (Right: Hip)  Patient location during evaluation: PACU Anesthesia Type: General Level of consciousness: awake and alert Pain management: pain level controlled Vital Signs Assessment: post-procedure vital signs reviewed and stable Respiratory status: spontaneous breathing, nonlabored ventilation, respiratory function stable and patient connected to nasal cannula oxygen Cardiovascular status: blood pressure returned to baseline and stable Postop Assessment: no apparent nausea or vomiting Anesthetic complications: no   No notable events documented.   Last Vitals:  Vitals:   01/04/22 1015 01/04/22 1044  BP: 137/69 138/72  Pulse: (!) 104 (!) 107  Resp: 12 16  Temp:  36.6 C  SpO2: 100% 100%    Last Pain:  Vitals:   01/04/22 1044  TempSrc: Oral  PainSc:                  Martha Clan

## 2022-01-04 NOTE — Transfer of Care (Signed)
Immediate Anesthesia Transfer of Care Note  Patient: Michaela Johnson  Procedure(s) Performed: INTRAMEDULLARY (IM) NAIL INTERTROCHANTRIC (Right: Hip)  Patient Location: PACU  Anesthesia Type:General  Level of Consciousness: drowsy and patient cooperative  Airway & Oxygen Therapy: Patient Spontanous Breathing  Post-op Assessment: Report given to RN and Post -op Vital signs reviewed and stable  Post vital signs: Reviewed and stable  Last Vitals:  Vitals Value Taken Time  BP 105/60 01/04/22 0921  Temp    Pulse 28 01/04/22 0923  Resp 16 01/04/22 0924  SpO2 79 % 01/04/22 0923  Vitals shown include unvalidated device data.  Last Pain:  Vitals:   01/04/22 0620  TempSrc:   PainSc: 8          Complications: No notable events documented.

## 2022-01-04 NOTE — Anesthesia Preprocedure Evaluation (Addendum)
Anesthesia Evaluation  Patient identified by MRN, date of birth, ID band Patient awake    Reviewed: Allergy & Precautions, H&P , NPO status , Patient's Chart, lab work & pertinent test results, reviewed documented beta blocker date and time   History of Anesthesia Complications Negative for: history of anesthetic complications  Airway Mallampati: II  TM Distance: >3 FB Neck ROM: full    Dental no notable dental hx. (+) Edentulous Upper, Edentulous Lower, Dental Advidsory Given   Pulmonary neg shortness of breath, neg COPD, neg recent URI, Current Smoker and Patient abstained from smoking.,    Pulmonary exam normal breath sounds clear to auscultation       Cardiovascular Exercise Tolerance: Good hypertension, (-) angina(-) Past MI and (-) Cardiac Stents + dysrhythmias Atrial Fibrillation (-) Valvular Problems/Murmurs Rhythm:regular Rate:Normal     Neuro/Psych PSYCHIATRIC DISORDERS Anxiety Depression negative neurological ROS     GI/Hepatic Neg liver ROS, GERD  ,  Endo/Other  diabetes  Renal/GU negative Renal ROS  negative genitourinary   Musculoskeletal   Abdominal   Peds  Hematology negative hematology ROS (+)   Anesthesia Other Findings Past Medical History: No date: Alcohol use No date: Anxiety No date: Arrhythmia No date: Arthritis 07/31/2020: Chronic alcohol use 07/31/2020: Chronic anticoagulation 07/30/2020: 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 No date: Constipation No date: Depression No date: Diabetes mellitus without complication (HCC) No date: Dysrhythmia     Comment:   PAF No date: GERD (gastroesophageal reflux disease) No date: Hypertension No date: Neuropathy 07/31/2020: Nicotine dependence No date: PAF (paroxysmal atrial fibrillation) (HCC) No date: Panic attacks No date: Pneumonia No date: Tobacco use No date: Vitamin D deficiency   Reproductive/Obstetrics negative OB  ROS                             Anesthesia Physical Anesthesia Plan  ASA: 3  Anesthesia Plan: General   Post-op Pain Management:    Induction: Intravenous  PONV Risk Score and Plan: 2 and Ondansetron, Dexamethasone, Midazolam and Treatment may vary due to age or medical condition  Airway Management Planned: LMA  Additional Equipment:   Intra-op Plan:   Post-operative Plan: Extubation in OR  Informed Consent: I have reviewed the patients History and Physical, chart, labs and discussed the procedure including the risks, benefits and alternatives for the proposed anesthesia with the patient or authorized representative who has indicated his/her understanding and acceptance.     Dental Advisory Given  Plan Discussed with: Anesthesiologist, CRNA and Surgeon  Anesthesia Plan Comments:        Anesthesia Quick Evaluation

## 2022-01-04 NOTE — Op Note (Signed)
01/04/2022  9:21 AM  Patient:   Michaela Johnson  Pre-Op Diagnosis:   Closed displaced intertrochanteric fracture, right hip.  Post-Op Diagnosis:   Same  Procedure:   Reduction and internal fixation of displaced intertrochanteric right hip fracture with Biomet Affixis TFN nail.  Surgeon:   Pascal Lux, MD  Assistant:   None  Anesthesia:   General LMA  Findings:   As above  Complications:   None  EBL:   50 cc  Fluids:   1100 cc crystalloid  UOP:   None  TT:   None  Drains:   None  Closure:   Staples  Implants:   Biomet Affixis 9 x 360 mm TFN with an 85 mm lag screw and a 32 mm distal interlocking screw  Brief Clinical Note:   The patient is a 61 year old female who sustained the above-noted injury several days ago when she apparently tripped over her dog and landed on her right side. X-rays in the emergency room demonstrated the above-noted injury. The patient has been cleared medically and presents at this time for reduction and internal fixation of the displaced intertrochanteric right hip fracture.  Procedure:   The patient was brought into the operating room and lain in the supine position. After adequate general laryngeal mask anesthesia was obtained, the patient was repositioned on the fracture table. The uninjured leg was placed in a flexed and abducted position while the injured lower extremity was placed in longitudinal traction. The fracture was reduced using longitudinal traction and internal rotation. The adequacy of reduction was verified fluoroscopically in AP and lateral projections and found to be near anatomic. The lateral aspects of the right hip and thigh were prepped with ChloraPrep solution before being draped sterilely. Preoperative antibiotics were administered. A timeout was performed to verify the appropriate surgical site.   The greater trochanter was identified fluoroscopically and an approximately 3 cm incision made about 2-3 fingerbreadths above  the tip of the greater trochanter. The incision was carried down through the subcutaneous tissues to expose the gluteal fascia. This was split the length of the incision, providing access to the tip of the trochanter. Under fluoroscopic guidance, a guidewire was drilled through the tip of the trochanter into the proximal metaphysis to the level of the lesser trochanter. After verifying its position fluoroscopically in AP and lateral projections, it was overreamed with the initial reamer to the depth of the lesser trochanter. A guidewire was passed down through the femoral canal to the supracondylar region. The adequacy of guidewire position was verified fluoroscopically in AP and lateral projections before the length of the guidewire within the canal was measured and found to be 375 mm. Therefore, a 360 mm length nail was selected. The guidewire was overreamed sequentially using the flexible reamers, beginning with a 9 mm reamer and progressing to a 10.5 mm reamer. This provided good cortical chatter. The 9 x 360 mm Biomet Affixis TFN rod was selected and advanced to the appropriate depth, as verified fluoroscopically.   The guide system for the lag screw was positioned and advanced through an approximately 2 cm stab incision over the lateral aspect of the proximal femur. The guidewire was drilled up through the trochanteric femoral nail and into the femoral neck to rest within 5 mm of subchondral bone. After verifying its position in the femoral neck and head in both AP and lateral projections, the guidewire was measured and found to be optimally replicated by an 85 mm lag screw. The guidewire  was overreamed to the appropriate depth before the lag screw was inserted and advanced to the appropriate depth as verified fluoroscopically in AP and lateral projections. The locking screw was advanced, then backed off a quarter turn to set the lag screw. Again the adequacy of hardware position and fracture reduction was  verified fluoroscopically in AP and lateral projections and found to be excellent.  Attention was directed distally. Using the "perfect circle" technique, the leg and fluoroscopy machine were positioned appropriately. An approximately 1.5 cm stab incision was made over the skin at the appropriate point before the drill bit was advanced through the cortex and across the static hole of the nail. The appropriate length of the screw was determined before the 32 mm distal interlocking screw was positioned, then advanced and tightened securely. Again the adequacy of screw position was verified fluoroscopically in AP and lateral projections and found to be excellent.  The wounds were irrigated thoroughly with sterile saline solution before the abductor fascia was reapproximated using #0 Vicryl interrupted sutures. The subcutaneous tissues were closed using 2-0 Vicryl interrupted sutures. The skin was closed using staples. A total of 30 cc of 0.5% Sensorcaine with epinephrine was injected in and around all incisions. Sterile occlusive dressings were applied to all wounds before the patient was awakened, extubated, and returned to the recovery room in satisfactory condition after tolerating the procedure well.

## 2022-01-04 NOTE — Assessment & Plan Note (Signed)
Rate well controlled. -Continue diltiazem. -Home Xarelto is being switched with heparin for surgery

## 2022-01-04 NOTE — Assessment & Plan Note (Addendum)
Orthopedic was consulted and she went to the OR with orthopedic this morning s/p ORIF.  Tolerated the procedure well Xarelto is being held and she was placed on heparin. Echocardiogram was ordered by admitting provider for risk stratification-was with normal EF and grade 1 diastolic dysfunction. -Continue with pain management. -Continue with supportive care

## 2022-01-04 NOTE — Progress Notes (Signed)
[facility] [place] for rivaroxaban (Xarelto) Indication: atrial fibrillation  Allergies  Allergen Reactions   Glipizide     Other reaction(s): Other (See Comments)   Metformin Diarrhea   Codeine Itching   Levaquin [Levofloxacin In D5w] Itching    Patient Measurements: Height: '5\' 2"'$  (157.5 cm) Weight: 56.7 kg (125 lb) IBW/kg (Calculated) : 50.1  Vital Signs: Temp: 97.9 F (36.6 C) (06/17 1044) Temp Source: Oral (06/17 1044) BP: 138/72 (06/17 1044) Pulse Rate: 107 (06/17 1044)  Labs: Recent Labs    01/02/22 1627 01/02/22 1729 01/03/22 0646  HGB 14.5  --  14.5  HCT 44.5  --  44.5  PLT 181  --  285  LABPROT  --   --  12.8  INR  --   --  1.0  CREATININE  --  0.84 0.48    Estimated Creatinine Clearance: 57.7 mL/min (by C-G formula based on SCr of 0.48 mg/dL).   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   01/02/2022 at 0600   Cholecalciferol (VITAMIN D) 125 MCG (5000 UT) CAPS Take 1 capsule by mouth daily. 30 capsule 5 01/02/2022 at 0600   diltiazem (CARDIZEM CD) 180 MG 24 hr capsule Take 1 capsule (180 mg total) by mouth daily. 90 capsule 3 01/02/2022 at 0600   FLUoxetine (PROZAC) 40 MG capsule Take 40 mg by mouth daily.   01/02/2022 at 0600    JARDIANCE 10 MG TABS tablet Take 10 mg by mouth daily.   01/02/2022 at 0600   LANTUS SOLOSTAR 100 UNIT/ML Solostar Pen Inject 37 Units into the skin daily. This is your home dose of long-acting insulin. (Patient taking differently: Inject 36 Units into the skin daily. This is your home dose of long-acting insulin.) 15 mL 11 01/02/2022 at 0600   lisinopril (ZESTRIL) 40 MG tablet Take 40 mg by mouth daily.   01/02/2022 at 0600   nicotine (NICODERM CQ - DOSED IN MG/24 HOURS) 21 mg/24hr patch Place 1 patch (21 mg total) onto the skin daily. 28 patch 0 Past Week at prn   omeprazole (PRILOSEC) 20 MG capsule Take 20 mg by mouth daily.   01/02/2022 at 0600   rivaroxaban (XARELTO) 20 MG TABS tablet Take 1 tablet (20 mg total) by mouth daily with supper. 30 tablet 11 01/02/2022 at 0600   albuterol (PROVENTIL HFA;VENTOLIN HFA) 108 (90 Base) MCG/ACT inhaler Inhale 2 puffs into the lungs every 6 (six) hours as needed for wheezing or shortness of breath. (Patient not taking: Reported on 01/03/2022) 1 Inhaler 2 Not Taking   diphenhydrAMINE (BENADRYL) 12.5 MG/5ML liquid Take 25 mg by mouth 4 (four) times daily as needed for itching.  prn at prn   gabapentin (NEURONTIN) 300 MG capsule Take 600 mg by mouth at bedtime as needed (Neuropathy).   01/01/2022 at 2000   insulin aspart (NOVOLOG) 100 UNIT/ML injection Inject 4 Units into the skin 3 (three) times daily with meals. Short-acting insulin 10.8 mL 0     Assessment: 61 year old female s/p fixation of displaced intertrochanteric right hip fracture on 6/17 AM. PMH anxiety, depression, Afib on Xarelto, DM, EtOH with cirrhosis, hypertension, GERD, osteoporosis.   BL Hgb, platelets stable  Goal of Therapy:  N/a Monitor platelets by anticoagulation protocol: Yes   Plan: Rivaroxaban 20 mg daily with supper Restarting anticoagulation for 6/18 PM per MD request. (36 hrs post procedure)  Wynelle Cleveland, PharmD Pharmacy Resident  01/04/2022 11:33 AM

## 2022-01-05 DIAGNOSIS — D62 Acute posthemorrhagic anemia: Secondary | ICD-10-CM | POA: Diagnosis not present

## 2022-01-05 LAB — CBC
HCT: 29.8 % — ABNORMAL LOW (ref 36.0–46.0)
Hemoglobin: 10 g/dL — ABNORMAL LOW (ref 12.0–15.0)
MCH: 33.1 pg (ref 26.0–34.0)
MCHC: 33.6 g/dL (ref 30.0–36.0)
MCV: 98.7 fL (ref 80.0–100.0)
Platelets: 228 10*3/uL (ref 150–400)
RBC: 3.02 MIL/uL — ABNORMAL LOW (ref 3.87–5.11)
RDW: 11.3 % — ABNORMAL LOW (ref 11.5–15.5)
WBC: 10.7 10*3/uL — ABNORMAL HIGH (ref 4.0–10.5)
nRBC: 0 % (ref 0.0–0.2)

## 2022-01-05 LAB — BASIC METABOLIC PANEL
Anion gap: 6 (ref 5–15)
BUN: 38 mg/dL — ABNORMAL HIGH (ref 8–23)
CO2: 26 mmol/L (ref 22–32)
Calcium: 8.5 mg/dL — ABNORMAL LOW (ref 8.9–10.3)
Chloride: 102 mmol/L (ref 98–111)
Creatinine, Ser: 0.86 mg/dL (ref 0.44–1.00)
GFR, Estimated: >60 mL/min (ref >60)
Glucose, Bld: 291 mg/dL — ABNORMAL HIGH (ref 70–99)
Potassium: 4.7 mmol/L (ref 3.5–5.1)
Sodium: 134 mmol/L — ABNORMAL LOW (ref 135–145)

## 2022-01-05 LAB — GLUCOSE, CAPILLARY
Glucose-Capillary: 344 mg/dL — ABNORMAL HIGH (ref 70–99)
Glucose-Capillary: 378 mg/dL — ABNORMAL HIGH (ref 70–99)
Glucose-Capillary: 212 mg/dL — ABNORMAL HIGH (ref 70–99)
Glucose-Capillary: 261 mg/dL — ABNORMAL HIGH (ref 70–99)

## 2022-01-05 MED ORDER — INSULIN ASPART 100 UNIT/ML IJ SOLN
5.0000 [IU] | Freq: Three times a day (TID) | INTRAMUSCULAR | Status: DC
Start: 2022-01-05 — End: 2022-01-06
  Administered 2022-01-05 – 2022-01-06 (×3): 5 [IU] via SUBCUTANEOUS
  Filled 2022-01-05 (×3): qty 1

## 2022-01-05 MED ORDER — FE FUMARATE-B12-VIT C-FA-IFC PO CAPS
1.0000 | ORAL_CAPSULE | Freq: Three times a day (TID) | ORAL | Status: DC
  Administered 2022-01-05 – 2022-01-07 (×6): 1 via ORAL
  Filled 2022-01-05 (×9): qty 1

## 2022-01-05 MED ORDER — INSULIN GLARGINE-YFGN 100 UNIT/ML ~~LOC~~ SOLN
25.0000 [IU] | Freq: Two times a day (BID) | SUBCUTANEOUS | Status: DC
  Administered 2022-01-05: 25 [IU] via SUBCUTANEOUS
  Filled 2022-01-05 (×2): qty 0.25

## 2022-01-05 MED ORDER — GLUCERNA SHAKE PO LIQD
237.0000 mL | Freq: Three times a day (TID) | ORAL | Status: DC
  Administered 2022-01-05 – 2022-01-06 (×2): 237 mL via ORAL

## 2022-01-05 NOTE — Assessment & Plan Note (Signed)
Orthopedic was consulted s/p ORIF on 6/17.  Tolerated the procedure well Xarelto is being held and she was placed on heparin. Echocardiogram  with normal EF and grade 1 diastolic dysfunction. -Continue with pain management. -Continue with supportive care

## 2022-01-05 NOTE — Progress Notes (Signed)
Progress Note   Patient: Michaela Johnson NAT:[id number] DOB: [birth year] DOA: 01/02/2022     3 DOS: the patient was seen and examined on 01/05/2022   Brief hospital course: Taken from H&P.  Michaela Johnson is a 61 y.o. female with a known history of anxiety, depression,  Afib on Xarelto, DM, EtOH with cirrhosis, HTN, GERD, osteoporosis presents to the emergency department for evaluation of right hip pain s/p mechanical fall this afternoon.  Patient was knocked over by her dog landed on her right hip and right arm.  She complains of right wrist and forearm pain as well as right hip pain.  She denies any preceding symptoms like dizziness, lightheadedness, loss of consciousness.    Patient denies fevers/chills, weakness, dizziness, chest pain, shortness of breath, N/V/C/D, abdominal pain, dysuria/frequency, changes in mental status.   She was found to have right intertrochanteric hip fracture and right radius fracture. Right radial fracture was reduced and splinted in ED. Orthopedic is on board and she will be going to the OR tomorrow, surgery was delayed due to her Xarelto use  6/16: Patient continued to have significant pain involving right upper and lower extremities. Labs pretty much unremarkable, A1c of 8.4, ethanol levels were less than 10. Patient most likely need SNF after the surgery-will be a difficult disposition as she does not have insurance.  6/17: Patient underwent ORIF today.  Tolerated the procedure well.  6/18: No acute overnight event.  Restarting home Xarelto.  CBC with decrease of hemoglobin to 10 from 14.  Starting her on iron supplement.  CBG remained elevated-adjusting insulin   Assessment and Plan: * Closed intertrochanteric fracture of hip, right, initial encounter [facility]) Orthopedic was consulted s/p ORIF on 6/17.  Tolerated the procedure well Xarelto is being held and she was placed on heparin. Echocardiogram  with normal EF and grade 1 diastolic dysfunction. -Continue  with pain management. -Continue with supportive care  Closed right radial fracture S/p reduction and splint placement. -PT/OT evaluation -Continue with pain management  PAF (paroxysmal atrial fibrillation) (HCC) Rate well controlled. -Continue diltiazem. -Home Xarelto is being switched with heparin for surgery  Atrial fibrillation with rapid ventricular response (HCC)-resolved as of 01/03/2022 Rate well controlled. -Continue with diltiazem -Eliquis was switched with heparin  Diabetes ([place]) Seems uncontrolled with hyperglycemia and A1c of 8.4. Patient was on Jardiance and Lantus at home.  CBG remained elevated above 300 -Increase Semglee to 25 units twice daily -Add 5 units of NovoLog with meals -Continue with SSI  Hypertension associated with diabetes ([place]) Blood pressure mildly elevated. -Continue home lisinopril and Cardizem  Chronic alcohol use Ethanol levels less than 10. -Monitor with CIWA  Nicotine dependence - Nicotine patch as needed  Neuropathy - Continue with home Neurontin  Acute postoperative anemia due to expected blood loss Hemoglobin dropped to 10 today, from 14. -Start her on iron supplement -Monitor hemoglobin -Transfuse below 7     Subjective: Patient was complaining of pain with ambulation.  Physical Exam: Vitals:   01/04/22 1551 01/04/22 1950 01/05/22 0333 01/05/22 0748  BP: (!) 113/57 (!) 105/55 116/60 124/73  Pulse: 99 94 85 84  Resp: '18 19 15 18  '$ Temp: 98.3 F (36.8 C) 98.5 F (36.9 C) 97.9 F (36.6 C) 98.5 F (36.9 C)  TempSrc:    Oral  SpO2: 95% 97% 91% 99%  Weight:      Height:       General.  In no acute distress. Pulmonary.  Lungs clear bilaterally, normal respiratory effort.  CV.  Regular rate and rhythm, no JVD, rub or murmur. Abdomen.  Soft, nontender, nondistended, BS positive. CNS.  Alert and oriented .  No focal neurologic deficit. Extremities.  No edema, no cyanosis, pulses intact and symmetrical.  Right upper  extremity with splint and right lower extremity with clean bandage on hip. Psychiatry.  Judgment and insight appears normal.  Data Reviewed: Prior notes and labs reviewed  Family Communication: Discussed with patient.  Disposition: Status is: Inpatient Remains inpatient appropriate because:  severity of illness   Planned Discharge Destination: To be determined-pending PT evaluation  DVT prophylaxis.  Xarelto Time spent: 42 minutes  This record has been created using Systems analyst. Errors have been sought and corrected,but may not always be located. Such creation errors do not reflect on the standard of care.  Author: Lorella Nimrod, MD 01/05/2022 1:02 PM  For on call review [email or web address].

## 2022-01-05 NOTE — Progress Notes (Signed)
Physical Therapy Evaluation Patient Details Name: Pippa Hanif MRN: [id number] DOB: Mar 27, 1961 Today's Date: 01/05/2022  History of Present Illness  Pt is a 61 y.o. female with PMH of tobacco abuse, alcohol abuse, HTN, paroxysmal a-fib for which she is on Eliquis, diabetes, anxiety/depression, GERD, osteoporosis, vitamin D deficiency, and panic attacks.  The patient lives independently and was in her usual state of health when she was knocked over by her dog while out in the yard, landing on her right side and injuring her right wrist and hip on 6/15.  The patient was brought to the emergency room where x-rays demonstrated a displaced intertrochanteric fracture of the right hip, as well as a displaced comminuted right distal radius fracture with an ulnar styloid process fracture. Pt underwent R hip ORIF on 6/17.   Clinical Impression  Pt is a pleasant 61 year old female who was admitted for R hip ORIF following a fall. Pt reported pain in R hip of 5/10 at rest, and pain in R arm at 3/10 at rest. Pt performs bed mobility and transfers with CGA and R platform walker. Pt required verbal cues for maintaining R UE NWB during functional mobility tasks. Pt demonstrates deficits with strength, balance, and functional mobility tasks. Would benefit from skilled PT to address above deficits and promote optimal return to PLOF. Currently recommend SNF at discharge due to pt current mobility status and living alone. If pt declines SNF, must have 24/7 support at home.    Recommendations for follow up therapy are one component of a multi-disciplinary discharge planning process, led by the attending physician.  Recommendations may be updated based on patient status, additional functional criteria and insurance authorization.  Follow Up Recommendations Skilled nursing-short term rehab (<3 hours/day)    Assistance Recommended at Discharge Frequent or constant Supervision/Assistance  Patient can return home with the  following  A lot of help with walking and/or transfers;Help with stairs or ramp for entrance;Assist for transportation;Assistance with cooking/housework;A lot of help with bathing/dressing/bathroom    Equipment Recommendations    Recommendations for Other Services  OT consult    Functional Status Assessment Patient has had a recent decline in their functional status and demonstrates the ability to make significant improvements in function in a reasonable and predictable amount of time.     Precautions / Restrictions Precautions Precautions: Fall Restrictions Weight Bearing Restrictions: Yes RUE Weight Bearing: Weight bear through elbow only RLE Weight Bearing: Weight bearing as tolerated      Mobility  Bed Mobility Overal bed mobility: Needs Assistance Bed Mobility: Supine to Sit     Supine to sit: Min guard     General bed mobility comments: Pt able to transfer to sitting EOB with CGA. Verbal cues to scoot forward and place bilat feet on the floor. Verbal cue to prevent WB through R hand.    Transfers Overall transfer level: Needs assistance Equipment used: Right platform walker Transfers: Sit to/from Stand, Bed to chair/wheelchair/BSC Sit to Stand: Min guard   Step pivot transfers: Min guard       General transfer comment: Verbal cues required for UE placement for standing and proper technique with step-pivot transfer with RW    Ambulation/Gait Ambulation/Gait assistance: Min guard Gait Distance (Feet): 2 Feet Assistive device: Right platform walker Gait Pattern/deviations: Step-to pattern, Decreased stance time - right, Decreased step length - left, Decreased step length - right, Decreased stride length, Decreased weight shift to right, Antalgic Gait velocity: decreased     General Gait  Details: Step pivot transfer from bed to chair with pt utilizing UE support on R platform walker. Verbal cues for technique with walker  Stairs            Wheelchair  Mobility    Modified Rankin (Stroke Patients Only)       Balance Overall balance assessment: Needs assistance Sitting-balance support: No upper extremity supported, Feet supported Sitting balance-Leahy Scale: Fair Sitting balance - Comments: Pt able to sit EOB without UE support and able to reach for platform walker without LOB   Standing balance support: Bilateral upper extremity supported, Reliant on assistive device for balance Standing balance-Leahy Scale: Fair Standing balance comment: Bilat UE support in standing with R platform walker and minimal WB through R LE due to pain                             Pertinent Vitals/Pain Pain Assessment Pain Assessment: 0-10 Pain Score: 5  (5/10 pain R hip at rest, 3/10 R arm at rest) Pain Location: R hip Pain Descriptors / Indicators: Aching, Dull, Operative site guarding Pain Intervention(s): Limited activity within patient's tolerance, Monitored during session, Repositioned    Home Living Family/patient expects to be discharged to:: Private residence Living Arrangements: Alone Available Help at Discharge: Other (Comment) (Pt reported no family support available, but might be able to get someone to help if needed. Unsure.) Type of Home: Apartment Home Access: Level entry       Home Layout: One level Home Equipment: Shower seat      Prior Function Prior Level of Function : Independent/Modified Independent;Driving             Mobility Comments: PLOF amb without AD ADLs Comments: Independent with ADLs     Hand Dominance   Dominant Hand: Right    Extremity/Trunk Assessment   Upper Extremity Assessment Upper Extremity Assessment: RUE deficits/detail RUE Deficits / Details: R hand/wrist NWB and splinted. Pt able to utilize weight bearing through elbow during functional mobility tasks. RUE: Unable to fully assess due to immobilization    Lower Extremity Assessment Lower Extremity Assessment: RLE  deficits/detail RLE Deficits / Details: R LE WB as tolerated. Pt able to complete SLRx10       Communication   Communication: No difficulties  Cognition Arousal/Alertness: Awake/alert Behavior During Therapy: WFL for tasks assessed/performed Overall Cognitive Status: Within Functional Limits for tasks assessed                                 General Comments: A&Ox4        General Comments      Exercises General Exercises - Lower Extremity Ankle Circles/Pumps: AROM, Both, 10 reps, Supine Quad Sets: AROM, Strengthening, Right, 10 reps, Supine Heel Slides: AROM, Strengthening, Right, 10 reps, Supine Straight Leg Raises: AROM, Strengthening, Right, 10 reps, Supine   Assessment/Plan    PT Assessment Patient needs continued PT services  PT Problem List Decreased strength;Decreased range of motion;Decreased balance;Decreased mobility;Decreased knowledge of use of DME;Decreased safety awareness;Decreased knowledge of precautions;Pain       PT Treatment Interventions DME instruction;Gait training;Functional mobility training;Therapeutic activities;Therapeutic exercise;Balance training;Patient/family education    PT Goals (Current goals can be found in the Care Plan section)  Acute Rehab PT Goals Patient Stated Goal: to go home PT Goal Formulation: With patient Time For Goal Achievement: 01/19/22 Potential to Achieve Goals: Good  Frequency BID     Co-evaluation               AM-PAC PT "6 Clicks" Mobility  Outcome Measure Help needed turning from your back to your side while in a flat bed without using bedrails?: A Little Help needed moving from lying on your back to sitting on the side of a flat bed without using bedrails?: A Little Help needed moving to and from a bed to a chair (including a wheelchair)?: A Little Help needed standing up from a chair using your arms (e.g., wheelchair or bedside chair)?: A Little Help needed to walk in hospital room?:  A Little Help needed climbing 3-5 steps with a railing? : A Lot 6 Click Score: 17    End of Session Equipment Utilized During Treatment: Gait belt Activity Tolerance: Patient tolerated treatment well Patient left: in chair;with call bell/phone within reach;with chair alarm set;Other (comment) (with heels elevated with pillows) Nurse Communication: Mobility status PT Visit Diagnosis: Muscle weakness (generalized) (M62.81);History of falling (Z91.81);Other abnormalities of gait and mobility (R26.89)    Time: 4920-1007 PT Time Calculation (min) (ACUTE ONLY): 35 min   Charges:              Mindi Akerson, SPT   Shante Archambeault 01/05/2022, 1:30 PM

## 2022-01-05 NOTE — Assessment & Plan Note (Signed)
Hemoglobin dropped to 10 today, from 14. -Start her on iron supplement -Monitor hemoglobin -Transfuse below 7

## 2022-01-05 NOTE — Progress Notes (Signed)
Subjective: 1 Day Post-Op Procedure(s) (LRB): INTRAMEDULLARY (IM) NAIL INTERTROCHANTRIC (Right) Right distal radius fracture requiring closed reduction and splinting  Patient reports pain as mild.   Patient is well, and has had no acute complaints or problems Denies any CP, SOB, ABD pain. We will continue therapy today.  Plan is to go Skilled nursing facility after hospital stay.  Objective: Vital signs in last 24 hours: Temp:  [96.9 F (36.1 C)-98.5 F (36.9 C)] 98.5 F (36.9 C) (06/18 0748) Pulse Rate:  [31-107] 84 (06/18 0748) Resp:  [12-19] 18 (06/18 0748) BP: (105-144)/(55-76) 124/73 (06/18 0748) SpO2:  [91 %-100 %] 99 % (06/18 0748)  Intake/Output from previous day: 06/17 0701 - 06/18 0700 In: 2513.9 [P.O.:480; I.V.:1733.9; IV Piggyback:300] Out: 1270 [Urine:1270] Intake/Output this shift: Total I/O In: 240 [P.O.:240] Out: 400 [Urine:400]  Recent Labs    01/02/22 1627 01/03/22 0646 01/05/22 0345  HGB 14.5 14.5 10.0*   Recent Labs    01/03/22 0646 01/05/22 0345  WBC 10.6* 10.7*  RBC 4.48 3.02*  HCT 44.5 29.8*  PLT 285 228   Recent Labs    01/03/22 0646 01/05/22 0345  NA 137 134*  K 4.2 4.7  CL 104 102  CO2 26 26  BUN 20 38*  CREATININE 0.48 0.86  GLUCOSE 100* 291*  CALCIUM 9.6 8.5*   Recent Labs    01/03/22 0646  INR 1.0    EXAM General - Patient is Alert, Appropriate, and Oriented Right upper extremity - Neurovascular intact Sensation intact distally.  Splint intact right upper extremity, minimal swelling throughout the digits. Right lower extremity-dressing is clean dry and intact.  Thigh is soft.  No swelling or edema throughout the right lower extremity.  Ankle plantarflexion dorsiflexion is intact.  Compartments soft. Motor Function - intact, moving foot and toes well on exam.   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 07/30/2020   Constipation    Depression    Diabetes mellitus without complication (HCC)    Dysrhythmia     PAF   GERD (gastroesophageal reflux disease)    Hypertension    Neuropathy    Nicotine dependence 07/31/2020   PAF (paroxysmal atrial fibrillation) (HCC)    Panic attacks    Pneumonia    Tobacco use    Vitamin D deficiency     Assessment/Plan:   1 Day Post-Op Procedure(s) (LRB): INTRAMEDULLARY (IM) NAIL INTERTROCHANTRIC (Right) Principal Problem:   Closed intertrochanteric fracture of hip, right, initial encounter ([place]) Active Problems:   PAF (paroxysmal atrial fibrillation) (HCC)   Diabetes (HCC)   Chronic alcohol use   Nicotine dependence   Neuropathy   Hypertension associated with diabetes ([place])   Closed right radial fracture  Estimated body mass index is 22.86 kg/m as calculated from the following:   Height as of this encounter: '5\' 2"'$  (1.575 m).   Weight as of this encounter: 56.7 kg. Advance diet Up with therapy, nonweightbearing right upper extremity.  Weightbearing as tolerated right lower extremity  Pain well controlled  Vital signs are stable  Labs stable.  Hemoglobin 10.0.  Recheck labs in the morning  Care management to assist with discharge to skilled nursing facility    DVT Prophylaxis - Xarelto, TED hose, and SCDs Weight-Bearing as tolerated to right leg  Nonweightbearing right upper extremity   T. Rachelle Hora, PA-C [place] 01/05/2022, 9:12 AM

## 2022-01-05 NOTE — Assessment & Plan Note (Signed)
Seems uncontrolled with hyperglycemia and A1c of 8.4. Patient was on Jardiance and Lantus at home.  CBG remained elevated above 300 -Increase Semglee to 25 units twice daily -Add 5 units of NovoLog with meals -Continue with SSI

## 2022-01-06 ENCOUNTER — Encounter: Payer: Self-pay | Admitting: Surgery

## 2022-01-06 LAB — GLUCOSE, CAPILLARY
Glucose-Capillary: 159 mg/dL — ABNORMAL HIGH (ref 70–99)
Glucose-Capillary: 159 mg/dL — ABNORMAL HIGH (ref 70–99)
Glucose-Capillary: 213 mg/dL — ABNORMAL HIGH (ref 70–99)
Glucose-Capillary: 311 mg/dL — ABNORMAL HIGH (ref 70–99)
Glucose-Capillary: 320 mg/dL — ABNORMAL HIGH (ref 70–99)
Glucose-Capillary: 75 mg/dL (ref 70–99)
Glucose-Capillary: 99 mg/dL (ref 70–99)

## 2022-01-06 LAB — CBC
HCT: 32 % — ABNORMAL LOW (ref 36.0–46.0)
Hemoglobin: 10.8 g/dL — ABNORMAL LOW (ref 12.0–15.0)
MCH: 33.9 pg (ref 26.0–34.0)
MCHC: 33.8 g/dL (ref 30.0–36.0)
MCV: 100.3 fL — ABNORMAL HIGH (ref 80.0–100.0)
Platelets: 242 10*3/uL (ref 150–400)
RBC: 3.19 MIL/uL — ABNORMAL LOW (ref 3.87–5.11)
RDW: 11.4 % — ABNORMAL LOW (ref 11.5–15.5)
WBC: 10.1 10*3/uL (ref 4.0–10.5)
nRBC: 0 % (ref 0.0–0.2)

## 2022-01-06 MED ORDER — OXYCODONE HCL 5 MG PO TABS: 5.0000 mg | ORAL_TABLET | ORAL | 0 refills | Status: DC | PRN

## 2022-01-06 MED ORDER — INSULIN GLARGINE-YFGN 100 UNIT/ML ~~LOC~~ SOLN
28.0000 [IU] | Freq: Two times a day (BID) | SUBCUTANEOUS | Status: DC
  Administered 2022-01-06 – 2022-01-07 (×3): 28 [IU] via SUBCUTANEOUS
  Filled 2022-01-06 (×4): qty 0.28

## 2022-01-06 MED ORDER — INSULIN ASPART 100 UNIT/ML IJ SOLN
8.0000 [IU] | Freq: Three times a day (TID) | INTRAMUSCULAR | Status: DC
Start: 2022-01-06 — End: 2022-01-07
  Administered 2022-01-06: 8 [IU] via SUBCUTANEOUS
  Filled 2022-01-06: qty 1

## 2022-01-06 MED ORDER — ACETAMINOPHEN 500 MG PO TABS: 500.0000 mg | ORAL_TABLET | Freq: Four times a day (QID) | ORAL | 0 refills | Status: DC | PRN

## 2022-01-06 MED ORDER — ONDANSETRON HCL 4 MG PO TABS
4.0000 mg | ORAL_TABLET | Freq: Four times a day (QID) | ORAL | 0 refills | Status: DC | PRN
Start: 2022-01-06 — End: 2022-06-03

## 2022-01-06 NOTE — Progress Notes (Cosign Needed)
Patient is not able to walk the distance required to go the bathroom, or he/she is unable to safely negotiate stairs required to access the bathroom.  A 3in1 BSC will alleviate this problem  

## 2022-01-06 NOTE — Discharge Instructions (Signed)
Diet: As you were doing prior to hospitalization   Shower:  May shower but keep the wounds dry, use an occlusive plastic wrap, NO SOAKING IN TUB.  If the bandage gets wet, change with a clean dry gauze.  Dressing:  Leave the splint intact to the right arm until first follow-up appointment.   Activity:  Increase activity slowly as tolerated, but follow the weight bearing instructions below.  No lifting or driving for 6 weeks.  Weight Bearing:   Weight bearing as tolerated to the right leg, non-weightbearing to the right arm.  To prevent constipation: you may use a stool softener such as -  Colace (over the counter) 100 mg by mouth twice a day  Drink plenty of fluids (prune juice may be helpful) and high fiber foods Miralax (over the counter) for constipation as needed.    Itching:  If you experience itching with your medications, try taking only a single pain pill, or even half a pain pill at a time.  You may take up to 10 pain pills per day, and you can also use benadryl over the counter for itching or also to help with sleep.   Precautions:  If you experience chest pain or shortness of breath - call 911 immediately for transfer to the hospital emergency department!!  If you develop a fever greater that 101 F, purulent drainage from wound, increased redness or drainage from wound, or calf pain-Call [place]                                              Follow- Up Appointment:  Please call for an appointment to be seen in 2 weeks at [facility]

## 2022-01-06 NOTE — Progress Notes (Signed)
Physical Therapy Treatment Patient Details Name: Michaela Johnson MRN: [id number] DOB: [birth year] Today's Date: 01/06/2022   History of Present Illness Pt is a 61 y.o. female with PMH of tobacco abuse, alcohol abuse, HTN, paroxysmal a-fib for which she is on Eliquis, diabetes, anxiety/depression, GERD, osteoporosis, vitamin D deficiency, and panic attacks.  The patient lives independently and was in her usual state of health when she was knocked over by her dog while out in the yard, landing on her right side and injuring her right wrist and hip on 6/15.  The patient was brought to the emergency room where x-rays demonstrated a displaced intertrochanteric fracture of the right hip, as well as a displaced comminuted right distal radius fracture with an ulnar styloid process fracture. Pt underwent R hip ORIF on 6/17.    PT Comments    Pt did not want to get OOB this morning, but agreeable to PT and supine therapeutic exercises. Pt education provided on precautions and WB status of R UE/LE, HEP booklet, and plan for possible ambulation/sitting up in chair during afternoon session. Pt completed supine there-ex activities with limited ROM noted during hip abd/add likely due to pain. Pt reported 9/10 pain in R LE during exercises. However, pt able to complete SLR x10 of R LE. Continue to recommend STR at discharge.    Recommendations for follow up therapy are one component of a multi-disciplinary discharge planning process, led by the attending physician.  Recommendations may be updated based on patient status, additional functional criteria and insurance authorization.  Follow Up Recommendations  Skilled nursing-short term rehab (<3 hours/day)     Assistance Recommended at Discharge Frequent or constant Supervision/Assistance  Patient can return home with the following A lot of help with walking and/or transfers;Help with stairs or ramp for entrance;Assist for transportation;Assistance with  cooking/housework;A lot of help with bathing/dressing/bathroom   Equipment Recommendations       Recommendations for Other Services       Precautions / Restrictions Precautions Precautions: Fall Restrictions Weight Bearing Restrictions: Yes RUE Weight Bearing: Weight bear through elbow only RLE Weight Bearing: Weight bearing as tolerated     Mobility  Bed Mobility               General bed mobility comments: NT; pt completed ther-ex in supine this morning    Transfers                   General transfer comment: NT; pt completed ther-ex in supine this morning.    Ambulation/Gait               General Gait Details: NT; pt completed ther-ex in supine this morning.   Stairs             Wheelchair Mobility    Modified Rankin (Stroke Patients Only)       Balance                                            Cognition Arousal/Alertness: Awake/alert Behavior During Therapy: WFL for tasks assessed/performed Overall Cognitive Status: Within Functional Limits for tasks assessed                                 General Comments: A&Ox4        Exercises General Exercises -  Lower Extremity Ankle Circles/Pumps: AROM, Both, 10 reps, Supine Quad Sets: AROM, Strengthening, Right, 10 reps, Supine, Other (comment) (3" holds) Gluteal Sets: AROM, Strengthening, Both, 10 reps, Supine, Other (comment) (3" holds) Short Arc Quad: AROM, Strengthening, Right, 10 reps, Supine Heel Slides: AROM, Strengthening, Right, 10 reps, Supine Hip ABduction/ADduction: AROM, Strengthening, Right, 10 reps, Supine (pt unable to complete full ROM) Straight Leg Raises: AROM, Strengthening, Right, 10 reps, Supine    General Comments        Pertinent Vitals/Pain Pain Assessment Pain Assessment: 0-10 Pain Score: 9  (pt reported 9/10 pain in R hip with ther-ex in supine.) Pain Location: R hip Pain Descriptors / Indicators: Aching, Dull,  Operative site guarding Pain Intervention(s): Limited activity within patient's tolerance, Monitored during session, Premedicated before session, Repositioned    Home Living                          Prior Function            PT Goals (current goals can now be found in the care plan section) Acute Rehab PT Goals Patient Stated Goal: to go home PT Goal Formulation: With patient Time For Goal Achievement: 01/19/22 Potential to Achieve Goals: Good Progress towards PT goals: Progressing toward goals    Frequency    BID      PT Plan Current plan remains appropriate    Co-evaluation              AM-PAC PT "6 Clicks" Mobility   Outcome Measure  Help needed turning from your back to your side while in a flat bed without using bedrails?: A Little Help needed moving from lying on your back to sitting on the side of a flat bed without using bedrails?: A Little Help needed moving to and from a bed to a chair (including a wheelchair)?: A Little Help needed standing up from a chair using your arms (e.g., wheelchair or bedside chair)?: A Little Help needed to walk in hospital room?: A Lot Help needed climbing 3-5 steps with a railing? : A Lot 6 Click Score: 16    End of Session   Activity Tolerance: Patient tolerated treatment well Patient left: in bed;with nursing/sitter in room;with call bell/phone within reach;with bed alarm set (Nursing in room duration of session) Nurse Communication: Mobility status PT Visit Diagnosis: Muscle weakness (generalized) (M62.81);History of falling (Z91.81);Other abnormalities of gait and mobility (R26.89)     Time: 3474-2595 PT Time Calculation (min) (ACUTE ONLY): 15 min  Charges:                        Shandra Szymborski, SPT  Zaneta Lightcap 01/06/2022, 11:25 AM

## 2022-01-06 NOTE — Plan of Care (Signed)

## 2022-01-06 NOTE — Assessment & Plan Note (Signed)
Orthopedic was consulted s/p ORIF on 6/17.  Tolerated the procedure well Xarelto was initially held and now is being restarted. Echocardiogram  with normal EF and grade 1 diastolic dysfunction. -Continue with pain management. -Continue with supportive care

## 2022-01-06 NOTE — Assessment & Plan Note (Signed)
S/p reduction and splint placement. -PT/OT is recommending SNF. -Continue with pain management

## 2022-01-06 NOTE — Evaluation (Signed)
Occupational Therapy Evaluation Patient Details Name: Michaela Johnson MRN: [id number] DOB: 05/28/1961 Today's Date: 01/06/2022   History of Present Illness Pt is a 61 y/o F s/p R hip ORIF 01/04/22 and closed displaced right distal radius fracture secondary to a fall. PMH of anxiety, depression, Afib on Eliquis, DM, EtOH with cirrhosis, HTN, GERD, osteoporosis.   Clinical Impression   Pt was seen for OT evaluation this date. Prior to hospital admission, pt was independent with mobility and all ADLs. Pt lives alone in a one level apartment with a level entry. Pt reported a friend may be able to provide assistance at home if needed. Pt presents to acute OT demonstrating impaired ADL performance and functional mobility 2/2 decreased activity tolerance and s/p R hip ORIF and RUE fx - functional strength/ROM/balance deficits. Pt currently requires CGA with mod vcs to maintain pcns for supine<>sit and MIN A with mod vcs for safety and maintaining pcns for squat pivot t/f to [facility] - therapist supported R elbow. SUPERVISION for pericare while seated. MAX A for donning/doffing socks bed level - needs assistance with donning BLE due to RUE pcns. Pt educated on placement of BSC for safe t/f. Pt reported a 7/10 pain with movement. Pt would benefit from skilled OT to address noted impairments and functional limitations (see below for any additional details). Upon hospital discharge, recommend STR to maximize pt safety and return to PLOF.      Recommendations for follow up therapy are one component of a multi-disciplinary discharge planning process, led by the attending physician.  Recommendations may be updated based on patient status, additional functional criteria and insurance authorization.   Follow Up Recommendations  Skilled nursing-short term rehab (<3 hours/day)    Assistance Recommended at Discharge Frequent or constant Supervision/Assistance  Patient can return home with the following A lot of help with  walking and/or transfers;A lot of help with bathing/dressing/bathroom;Assistance with cooking/housework;Assist for transportation    Functional Status Assessment  Patient has had a recent decline in their functional status and demonstrates the ability to make significant improvements in function in a reasonable and predictable amount of time.  Equipment Recommendations  BSC/3in1    Recommendations for Other Services       Precautions / Restrictions Precautions Precautions: Fall Restrictions Weight Bearing Restrictions: Yes RUE Weight Bearing: Weight bear through elbow only RLE Weight Bearing: Weight bearing as tolerated      Mobility Bed Mobility Overal bed mobility: Needs Assistance Bed Mobility: Supine to Sit, Sit to Supine     Supine to sit: Min guard Sit to supine: Min guard        Transfers Overall transfer level: Needs assistance Equipment used: None Transfers: Bed to chair/wheelchair/BSC     Squat pivot transfers: Min assist       General transfer comment: Pt required vcs to maintain NWB pcns for RUE, therapist supported R elbow during squat pivot t/f.      Balance Overall balance assessment: Needs assistance Sitting-balance support: No upper extremity supported, Feet supported Sitting balance-Leahy Scale: Good     Standing balance support: Bilateral upper extremity supported, During functional activity Standing balance-Leahy Scale: Fair                             ADL either performed or assessed with clinical judgement   ADL Overall ADL's : Needs assistance/impaired  General ADL Comments: Pt required MIN A with mod vcs for safety and maintaining pcns for BSC t/f. SUPERVISION for pericare while seated. MAX A for donning/doffing socks bed level - pt requires assistance with donning BLE due to RUE pcns.      Pertinent Vitals/Pain Pain Assessment Pain Assessment: 0-10 Pain Score:  7  Pain Location: R hip Pain Descriptors / Indicators: Discomfort, Grimacing, Guarding Pain Intervention(s): Limited activity within patient's tolerance, Monitored during session, Repositioned, Premedicated before session     Hand Dominance Right   Extremity/Trunk Assessment Upper Extremity Assessment Upper Extremity Assessment: RUE deficits/detail RUE Deficits / Details: R hand/wrist NWB and splinted   Lower Extremity Assessment Lower Extremity Assessment: RLE deficits/detail RLE Deficits / Details: RLE WBAT       Communication Communication Communication: No difficulties   Cognition Arousal/Alertness: Awake/alert Behavior During Therapy: WFL for tasks assessed/performed Overall Cognitive Status: Within Functional Limits for tasks assessed                                                  Home Living Family/patient expects to be discharged to:: Private residence Living Arrangements: Alone Available Help at Discharge: Other (Comment);Friend(s) (Pt reported a friend might be able to assist at home if needed) Type of Home: Apartment Home Access: Level entry     Home Layout: One level     Bathroom Shower/Tub: Teacher, early years/pre: Standard     Home Equipment: Shower seat          Prior Functioning/Environment Prior Level of Function : Independent/Modified Independent;Driving             Mobility Comments: No AD for mobility ADLs Comments: Independent with ADLs        OT Problem List: Decreased strength;Decreased range of motion;Decreased activity tolerance;Impaired balance (sitting and/or standing);Decreased safety awareness      OT Treatment/Interventions: Self-care/ADL training;Therapeutic exercise;Energy conservation;DME and/or AE instruction;Therapeutic activities;Patient/family education;Balance training    OT Goals(Current goals can be found in the care plan section) Acute Rehab OT Goals Patient Stated Goal: to  improve pain OT Goal Formulation: With patient Time For Goal Achievement: 01/20/22 Potential to Achieve Goals: Good ADL Goals Pt Will Perform Grooming: standing;with supervision (tolerate standing >10 minutes with LRAD) Pt Will Perform Lower Body Dressing: with adaptive equipment;sit to/from stand;with supervision (with LRAD) Pt Will Transfer to Toilet: regular height toilet;ambulating;with supervision (with raisted height and LRAD)  OT Frequency: Min 2X/week       AM-PAC OT "6 Clicks" Daily Activity     Outcome Measure Help from another person eating meals?: A Little Help from another person taking care of personal grooming?: A Little Help from another person toileting, which includes using toliet, bedpan, or urinal?: A Little Help from another person bathing (including washing, rinsing, drying)?: A Lot Help from another person to put on and taking off regular upper body clothing?: A Little Help from another person to put on and taking off regular lower body clothing?: A Lot 6 Click Score: 16   End of Session Equipment Utilized During Treatment: Other (comment) (none)  Activity Tolerance: Patient tolerated treatment well;Patient limited by pain Patient left: in bed;with call bell/phone within reach  OT Visit Diagnosis: Unsteadiness on feet (R26.81)                Time: 9485-4627 OT Time  Calculation (min): 23 min Charges:  OT General Charges $OT Visit: 1 Visit OT Evaluation $OT Eval Moderate Complexity: 1 Mod OT Treatments $Self Care/Home Management : 8-22 mins  [employer] OTDS  [place] Janasia Coverdale 01/06/2022, 12:05 PM

## 2022-01-06 NOTE — Assessment & Plan Note (Signed)
Seems uncontrolled with hyperglycemia and A1c of 8.4. Patient was on Jardiance and Lantus at home.  CBG remained elevated, now above 200, some improvement than before -Increase Semglee to 28 units twice daily -Increase mealtime coverage to 8 units -Continue with SSI

## 2022-01-06 NOTE — Assessment & Plan Note (Signed)
Hemoglobin dropped to 10>>10.8 today, from 14. Continue iron supplement -Monitor hemoglobin -Transfuse below 7

## 2022-01-06 NOTE — Progress Notes (Signed)
Progress Note   Patient: Michaela Johnson QVZ:[id number] DOB: [birth year] DOA: 01/02/2022     4 DOS: the patient was seen and examined on 01/06/2022   Brief hospital course: Taken from H&P.  Michaela Johnson is a 61 y.o. female with a known history of anxiety, depression,  Afib on Xarelto, DM, EtOH with cirrhosis, HTN, GERD, osteoporosis presents to the emergency department for evaluation of right hip pain s/p mechanical fall this afternoon.  Patient was knocked over by her dog landed on her right hip and right arm.  She complains of right wrist and forearm pain as well as right hip pain.  She denies any preceding symptoms like dizziness, lightheadedness, loss of consciousness.    Patient denies fevers/chills, weakness, dizziness, chest pain, shortness of breath, N/V/C/D, abdominal pain, dysuria/frequency, changes in mental status.   She was found to have right intertrochanteric hip fracture and right radius fracture. Right radial fracture was reduced and splinted in ED. Orthopedic is on board and she will be going to the OR tomorrow, surgery was delayed due to her Xarelto use  6/16: Patient continued to have significant pain involving right upper and lower extremities. Labs pretty much unremarkable, A1c of 8.4, ethanol levels were less than 10. Patient most likely need SNF after the surgery-will be a difficult disposition as she does not have insurance.  6/17: Patient underwent ORIF today.  Tolerated the procedure well.  6/18: No acute overnight event.  Restarting home Xarelto.  CBC with decrease of hemoglobin to 10 from 14.  Starting her on iron supplement.  CBG remained elevated-adjusting insulin  6/19: Patient remained stable, hemoglobin with some increased to 10.8.  PT is recommending rehab but patient does not have any insurance-TOC to help with displacement. CBG remained elevated although some improvement than before, continue to adjust insulin. Will be a very difficult placement per  TOC. Discussed with patient and she can arrange some family or close friends to help her. We will try getting charitable home health services and potential discharge tomorrow if remains stable.   Assessment and Plan: * Closed intertrochanteric fracture of hip, right, initial encounter [facility]) Orthopedic was consulted s/p ORIF on 6/17.  Tolerated the procedure well Xarelto was initially held and now is being restarted. Echocardiogram  with normal EF and grade 1 diastolic dysfunction. -Continue with pain management. -Continue with supportive care  Closed right radial fracture S/p reduction and splint placement. -PT/OT is recommending SNF. -Continue with pain management  PAF (paroxysmal atrial fibrillation) (HCC) Rate well controlled. -Continue diltiazem. -Home Xarelto is being switched with heparin for surgery  Atrial fibrillation with rapid ventricular response (HCC)-resolved as of 01/03/2022 Rate well controlled. -Continue with diltiazem -Eliquis was switched with heparin  Diabetes ([place]) Seems uncontrolled with hyperglycemia and A1c of 8.4. Patient was on Jardiance and Lantus at home.  CBG remained elevated, now above 200, some improvement than before -Increase Semglee to 28 units twice daily -Increase mealtime coverage to 8 units -Continue with SSI  Hypertension associated with diabetes ([place]) Blood pressure mildly elevated. -Continue home lisinopril and Cardizem  Chronic alcohol use Ethanol levels less than 10. -Monitor with CIWA  Nicotine dependence - Nicotine patch as needed  Neuropathy - Continue with home Neurontin  Acute postoperative anemia due to expected blood loss Hemoglobin dropped to 10>>10.8 today, from 14. Continue iron supplement -Monitor hemoglobin -Transfuse below 7     Subjective: Patient was seen and examined today.  Pain with ambulation only.  We discussed about the PT recommendations and  limitation based on her insurance.  Patient is trying  to arrange some home health with a friend or family member.  Physical Exam: Vitals:   01/06/22 0336 01/06/22 0732 01/06/22 0750 01/06/22 1050  BP: (!) 142/71 122/61 129/63 135/69  Pulse: 82 84 90 78  Resp: '18 16 16 16  '$ Temp: 98.2 F (36.8 C) 98.7 F (37.1 C) 98.6 F (37 C) 98.2 F (36.8 C)  TempSrc: Oral  Oral Oral  SpO2: 94% 96% 96% 95%  Weight:      Height:       General.     In no acute distress. Pulmonary.  Lungs clear bilaterally, normal respiratory effort. CV.  Regular rate and rhythm, no JVD, rub or murmur. Abdomen.  Soft, nontender, nondistended, BS positive. CNS.  Alert and oriented .  No focal neurologic deficit. Extremities. RUE with splint and right hip with clean bandage., pulses intact and symmetrical. Psychiatry.  Judgment and insight appears normal.  Data Reviewed: Prior data reviewed  Family Communication: Discussed with patient  Disposition: Status is: Inpatient Remains inpatient appropriate because: Severity of illness   Planned Discharge Destination: Home with Home Health  DVT prophylaxis.  Xarelto Time spent: 43 minutes  This record has been created using Systems analyst. Errors have been sought and corrected,but may not always be located. Such creation errors do not reflect on the standard of care.  Author: Lorella Nimrod, MD 01/06/2022 2:11 PM  For on call review [email or web address].

## 2022-01-06 NOTE — Progress Notes (Signed)
Physical Therapy Treatment Patient Details Name: Michaela Johnson MRN: [id number] DOB: [birth year] Today's Date: 01/06/2022   History of Present Illness Pt is a 61 y/o F s/p R hip ORIF 01/04/22 and closed displaced right distal radius fracture secondary to a fall. PMH of anxiety, depression, Afib on Eliquis, DM, EtOH with cirrhosis, HTN, GERD, osteoporosis.    PT Comments    Pt agreeable to therapy and asked to use BSC to void at start of session. Pt performed bed mobility with supervision and transfers and step pivots with CGA and R platform walker. Pt able to complete hygiene independently while standing at platform walker without LOB. Pt then ambulated 15 feet in room with CGA and R platform walker with decreased activity tolerance and endurance noted with pt distance limited by fatigue. Discussed with care team via secure chat concerns of pt discharging home. If pt unable to discharge to STR at discharge, pt would need equipment including R platform walker, BSC, shower chair, and wheelchair as well as frequent supervision at home.    Recommendations for follow up therapy are one component of a multi-disciplinary discharge planning process, led by the attending physician.  Recommendations may be updated based on patient status, additional functional criteria and insurance authorization.  Follow Up Recommendations  Skilled nursing-short term rehab (<3 hours/day)     Assistance Recommended at Discharge Frequent or constant Supervision/Assistance  Patient can return home with the following A lot of help with walking and/or transfers;Help with stairs or ramp for entrance;Assist for transportation;Assistance with cooking/housework;A lot of help with bathing/dressing/bathroom   Equipment Recommendations  BSC/3in1;Other (comment) (shower chair, right platform walker, wheelchair)    Recommendations for Other Services       Precautions / Restrictions Precautions Precautions:  Fall Restrictions Weight Bearing Restrictions: Yes RUE Weight Bearing: Weight bear through elbow only RLE Weight Bearing: Weight bearing as tolerated     Mobility  Bed Mobility Overal bed mobility: Needs Assistance Bed Mobility: Supine to Sit     Supine to sit: Supervision     General bed mobility comments: Pt able to sit EOB with Supervision. Required increased effort and time.    Transfers Overall transfer level: Needs assistance Equipment used: Right platform walker Transfers: Sit to/from Stand, Bed to chair/wheelchair/BSC Sit to Stand: Min guard   Step pivot transfers: Min guard       General transfer comment: Pt required verbal cues for hand placement during sit-to-stand transfers and for maintaining wider BoS during step pivot transfer. Pt requires verbal cues to reach back for arm rest prior to sitting.    Ambulation/Gait Ambulation/Gait assistance: Min guard Gait Distance (Feet): 15 Feet Assistive device: Right platform walker Gait Pattern/deviations: Step-to pattern, Decreased stance time - right, Decreased step length - left, Decreased step length - right, Decreased stride length, Decreased weight shift to right, Antalgic, Narrow base of support Gait velocity: decreased     General Gait Details: Pt requires 2 steps with L for every 1 step with R due to limited WB on R LE. Pt ambulated in room with fatigue of L UE limiting distance. Verbal cues for maintaining wider BoS.   Stairs             Wheelchair Mobility    Modified Rankin (Stroke Patients Only)       Balance Overall balance assessment: Needs assistance Sitting-balance support: No upper extremity supported, Feet supported Sitting balance-Leahy Scale: Good Sitting balance - Comments: Pt able to sit EOB without UE support and able  to reach for platform walker without LOB.   Standing balance support: Bilateral upper extremity supported, During functional activity, Reliant on assistive  device for balance, Single extremity supported Standing balance-Leahy Scale: Good Standing balance comment: Bilat UE support during ambulation with R platform walker and minimal WB through R LE. Pt able to stand with R UE on platform and complete toilet hygiene with L UE standing at [name]. No LOB.                            Cognition Arousal/Alertness: Awake/alert Behavior During Therapy: WFL for tasks assessed/performed Overall Cognitive Status: Within Functional Limits for tasks assessed                                 General Comments: A&Ox4        Exercises General Exercises - Lower Extremity Ankle Circles/Pumps: AROM, Both, 10 reps, Supine Quad Sets: AROM, Strengthening, Right, 10 reps, Supine, Other (comment) (3" holds) Gluteal Sets: AROM, Strengthening, Both, 10 reps, Supine, Other (comment) (3" holds) Short Arc Quad: AROM, Strengthening, Right, 10 reps, Supine Heel Slides: AROM, Strengthening, Right, 10 reps, Supine Hip ABduction/ADduction: AROM, Strengthening, Right, 10 reps, Supine (pt unable to complete full ROM) Straight Leg Raises: AROM, Strengthening, Right, 10 reps, Supine Other Exercises Other Exercises: Pt transferred to [facility] to void. Verbal cues for hand placement during transfers. Pt independent with hygiene in standing with R UE on platform of walker.    General Comments        Pertinent Vitals/Pain Pain Assessment Pain Assessment: 0-10 Pain Score: 4  Pain Location: R hip Pain Descriptors / Indicators: Discomfort, Guarding Pain Intervention(s): Limited activity within patient's tolerance, Monitored during session, Repositioned, Premedicated before session    Home Living                          Prior Function            PT Goals (current goals can now be found in the care plan section) Acute Rehab PT Goals Patient Stated Goal: to go home PT Goal Formulation: With patient Time For Goal Achievement:  01/19/22 Potential to Achieve Goals: Good Progress towards PT goals: Progressing toward goals    Frequency    BID      PT Plan Current plan remains appropriate    Co-evaluation              AM-PAC PT "6 Clicks" Mobility   Outcome Measure  Help needed turning from your back to your side while in a flat bed without using bedrails?: A Little Help needed moving from lying on your back to sitting on the side of a flat bed without using bedrails?: A Little Help needed moving to and from a bed to a chair (including a wheelchair)?: A Little Help needed standing up from a chair using your arms (e.g., wheelchair or bedside chair)?: A Little Help needed to walk in hospital room?: A Lot Help needed climbing 3-5 steps with a railing? : A Lot 6 Click Score: 16    End of Session Equipment Utilized During Treatment: Gait belt Activity Tolerance: Patient tolerated treatment well Patient left: in chair;with nursing/sitter in room;Other (comment) (Left in chair with nursing assisting to bathe after PT session.) Nurse Communication: Mobility status PT Visit Diagnosis: Muscle weakness (generalized) (M62.81);History of falling (Z91.81);Other abnormalities of  gait and mobility (R26.89)     Time: 5521-7471 PT Time Calculation (min) (ACUTE ONLY): 20 min  Charges:                        Michaela Johnson, Michaela Johnson  Michaela Johnson 01/06/2022, 4:29 PM

## 2022-01-06 NOTE — Progress Notes (Signed)
Subjective: 2 Days Post-Op Procedure(s) (LRB): INTRAMEDULLARY (IM) NAIL INTERTROCHANTRIC (Right) Right distal radius fracture requiring closed reduction and splinting Pain is mild in the right wrist, moderate pain in the right thigh this morning after therapy yesterday. Denies any CP, SOB, ABD pain. We will continue therapy today.  Plan is to go Skilled nursing facility after hospital stay however patient does not have insurance.  Care management to assist with discharge planning.  Objective: Vital signs in last 24 hours: Temp:  [98.2 F (36.8 C)-98.8 F (37.1 C)] 98.2 F (36.8 C) (06/19 0336) Pulse Rate:  [82-84] 82 (06/19 0336) Resp:  [18] 18 (06/19 0336) BP: (124-142)/(62-73) 142/71 (06/19 0336) SpO2:  [94 %-99 %] 94 % (06/19 0336)  Intake/Output from previous day: 06/18 0701 - 06/19 0700 In: 600 [P.O.:600] Out: 403 [Urine:401; Stool:2] Intake/Output this shift: No intake/output data recorded.  Recent Labs    01/05/22 0345 01/06/22 0532  HGB 10.0* 10.8*   Recent Labs    01/05/22 0345 01/06/22 0532  WBC 10.7* 10.1  RBC 3.02* 3.19*  HCT 29.8* 32.0*  PLT 228 242   Recent Labs    01/05/22 0345  NA 134*  K 4.7  CL 102  CO2 26  BUN 38*  CREATININE 0.86  GLUCOSE 291*  CALCIUM 8.5*   No results for input(s): "LABPT", "INR" in the last 72 hours.   EXAM General - Patient is Alert, Appropriate, and Oriented Right upper extremity - Neurovascular intact Sensation intact distally.  Splint intact right upper extremity, minimal swelling throughout the digits. She is able to flex and extend her fingers this morning without pain. Right lower extremity-dressing is clean dry and intact.  Thigh is soft.  No swelling or edema throughout the right lower extremity.  Ankle plantarflexion dorsiflexion is intact.  Compartments soft. Motor Function - intact, moving foot and toes well on exam.  Abdomen soft with intact bowel sounds.  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 07/30/2020   Constipation    Depression    Diabetes mellitus without complication (HCC)    Dysrhythmia     PAF   GERD (gastroesophageal reflux disease)    Hypertension    Neuropathy    Nicotine dependence 07/31/2020   PAF (paroxysmal atrial fibrillation) (HCC)    Panic attacks    Pneumonia    Tobacco use    Vitamin D deficiency     Assessment/Plan:   2 Days Post-Op Procedure(s) (LRB): INTRAMEDULLARY (IM) NAIL INTERTROCHANTRIC (Right) Principal Problem:   Closed intertrochanteric fracture of hip, right, initial encounter ([place]) Active Problems:   PAF (paroxysmal atrial fibrillation) (HCC)   Diabetes (HCC)   Chronic alcohol use   Nicotine dependence   Neuropathy   Hypertension associated with diabetes ([place])   Closed right radial fracture   Acute postoperative anemia due to expected blood loss  Estimated body mass index is 22.86 kg/m as calculated from the following:   Height as of this encounter: '5\' 2"'$  (1.575 m).   Weight as of this encounter: 56.7 kg. Advance diet Up with therapy, nonweightbearing right upper extremity.  Weightbearing as tolerated right lower extremity  Vitals and labs reviewed this AM, Hg 10.8 this morning. Patient has had a BM. Up with therapy today.  Currently recommending SNF however it appears  the patient does not have insurance.   Care management to assist with discharge planning.  Continue Xarelto for DVT prophylaxis. Remain in splint, non-weightbearing to the right arm. Follow-up with [facility] in 10-14 days for staple removal and splint removal and repeat x-rays.  DVT Prophylaxis - Xarelto, TED hose, and SCDs Weight-Bearing as tolerated to right leg Nonweightbearing right upper extremity   J. Cameron Proud, PA-C [place] 01/06/2022,  7:10 AM

## 2022-01-06 NOTE — Progress Notes (Signed)
Met with the patient in the room at the bedside to discuss DC plan and needs She lives alone at home, she stated that she will see if she can get someone to stay with her but wont do that until she gets home tomorrow She does not have transportation home and plans to take a cab, I asked her if she had friends or family at all that could pick her up, she said no that she did not She will need a right side platform walker and a 3 in 1, thru [facility], [facility] will deliver to the bedside She will need HH set up thru [facility] as well, [facility] is not able to accept them, The rotation changes tomorrow, will reach out to [facility] tomorrow to set up [facility]  

## 2022-01-07 ENCOUNTER — Inpatient Hospital Stay: Payer: Self-pay

## 2022-01-07 LAB — GLUCOSE, CAPILLARY: Glucose-Capillary: 274 mg/dL — ABNORMAL HIGH (ref 70–99)

## 2022-01-07 MED ORDER — ADULT MULTIVITAMIN W/MINERALS CH: 1.0000 | ORAL_TABLET | Freq: Every day | ORAL | 1 refills | Status: DC

## 2022-01-07 MED ORDER — INSULIN ASPART 100 UNIT/ML IJ SOLN: 6.0000 [IU] | Freq: Three times a day (TID) | INTRAMUSCULAR | Status: DC

## 2022-01-07 MED ORDER — FE FUMARATE-B12-VIT C-FA-IFC PO CAPS: 1.0000 | ORAL_CAPSULE | Freq: Three times a day (TID) | ORAL | 1 refills | Status: DC

## 2022-01-07 MED ORDER — DOCUSATE SODIUM 100 MG PO CAPS: 100.0000 mg | ORAL_CAPSULE | Freq: Two times a day (BID) | ORAL | 0 refills | Status: DC

## 2022-01-07 NOTE — Progress Notes (Signed)
PT Cancellation Note  Patient Details Name: Michaela Johnson MRN: [id number] DOB: 28-Nov-1960   Cancelled Treatment:    Reason Eval/Treat Not Completed: Other (comment). PT spoke with care team about how patient would benefit from [facility] at discharge due to limited progression with ambulation and concerns about balance/safety at home. Per RN pt declined any PT session in PM, and eager to discharge home immediately.    Lieutenant Diego PT, DPT 1:38 PM,01/07/22

## 2022-01-07 NOTE — TOC Progression Note (Signed)
Transition of Care [facility]) - Progression Note    Patient Details  Name: Destyni Hoppel MRN: [id number] Date of Birth: 1960/11/04  [facility] CM/SW Contact  Eileen Stanford, LCSW Phone Number: 01/07/2022, 2:20 PM  Clinical Narrative:   CSW spoke with [place] with [facility] ([facility] for the week) and he declined servicing pt stating pt should not be going home should be going to SNF.    Expected Discharge Plan: [place] Barriers to Discharge: Continued Medical Work up  Expected Discharge Plan and Services Expected Discharge Plan: [place]   Discharge Planning Services: CM Consult   Living arrangements for the past 2 months: Single Family Home Expected Discharge Date: 01/07/22               DME Arranged: Berta Minor rolling DME Agency: [employer] Date DME Agency Contacted: 01/06/22 Time DME Agency Contacted: [phone] Representative spoke with at DME Agency: [place]             Social Determinants of Health ([place]) Interventions    Readmission Risk Interventions     No data to display

## 2022-01-07 NOTE — Progress Notes (Addendum)
5009 Pt refused to sit up in chair for breakfast.   1154 d/c instructions reviewed with pt IV removed all questions and concerns answered.   1300 Pt informed that PT wanted to see her again this evening and that they recommend SNF because she would be unsafe at home. Pt refused and states "just please let me go I am going home now".   Kirby informed Dr Reesa Chew, PT and CM

## 2022-01-07 NOTE — Progress Notes (Signed)
Physical Therapy Treatment Patient Details Name: Michaela Johnson MRN: [id number] DOB: 1960-10-06 Today's Date: 01/07/2022   History of Present Illness Pt is a 61 y/o F s/p R hip ORIF 01/04/22 and closed displaced right distal radius fracture secondary to a fall. PMH of anxiety, depression, Afib on Eliquis, DM, EtOH with cirrhosis, HTN, GERD, osteoporosis.    PT Comments    Patient alert, pleasant, and planning on going home. Equipment in room adjusted for patient. She reported more R hip pain than RUE but did not quantify, exhibited mild-moderate signs of pain with RLE weight bearing. Supine to sit with supervision, very effortful but pt completed without assist. Sit <> stand several times during session, CGA. Of note patient unable to maintain standing balance without BUE; strong posterior lean including falling back into bed without support. She was also able to ambulate ~66f today. The pt exhibited very short, shuffled step. does not step past either foot in step length, limited ability to weight ber on RLE due to pain. assist for RW guidance/management also necessary for turning/backing up to recliner. Pt up in chair with RN and MD at bedside. Pt still adamant about returning home due to financial barriers despite encouragement and education and discussion of safety concerns.     Recommendations for follow up therapy are one component of a multi-disciplinary discharge planning process, led by the attending physician.  Recommendations may be updated based on patient status, additional functional criteria and insurance authorization.  Follow Up Recommendations  Skilled nursing-short term rehab (<3 hours/day)     Assistance Recommended at Discharge Frequent or constant Supervision/Assistance  Patient can return home with the following A lot of help with walking and/or transfers;Help with stairs or ramp for entrance;Assist for transportation;Assistance with cooking/housework;A lot of help with  bathing/dressing/bathroom   Equipment Recommendations  BSC/3in1;Other (comment) (shower chair, right platform walker, wheelchair)    Recommendations for Other Services       Precautions / Restrictions Precautions Precautions: Fall Required Braces or Orthoses: Splint/Cast Splint/Cast: on RUE Restrictions Weight Bearing Restrictions: Yes RUE Weight Bearing: Weight bear through elbow only RLE Weight Bearing: Weight bearing as tolerated     Mobility  Bed Mobility Overal bed mobility: Needs Assistance Bed Mobility: Supine to Sit     Supine to sit: Supervision, HOB elevated     General bed mobility comments: extra time, reliant on bed rails    Transfers Overall transfer level: Needs assistance Equipment used: Right platform walker Transfers: Sit to/from Stand, Bed to chair/wheelchair/BSC Sit to Stand: Min guard   Step pivot transfers: Min guard       General transfer comment: Pt required verbal cues for hand placement during sit-to-stand transfers and for maintaining wider BoS during step pivot transfer. Pt requires verbal cues to reach back for arm rest prior to sitting.    Ambulation/Gait Ambulation/Gait assistance: Min guard Gait Distance (Feet): 8 Feet Assistive device: Right platform walker         General Gait Details: pt with very short, shuffled step. does not step past either foot in step length, limited ability to weight ber on RLE due to pain. assist for RW guidance/management   Stairs             Wheelchair Mobility    Modified Rankin (Stroke Patients Only)       Balance Overall balance assessment: Needs assistance Sitting-balance support: Feet supported Sitting balance-Leahy Scale: Good     Standing balance support: Reliant on assistive device for balance, Bilateral  upper extremity supported   Standing balance comment: without BUE pt experiences posterior LOB, including falling back into bed. somehwat improved with cueing but still  unsteady                            Cognition Arousal/Alertness: Awake/alert Behavior During Therapy: WFL for tasks assessed/performed Overall Cognitive Status: Within Functional Limits for tasks assessed                                          Exercises      General Comments        Pertinent Vitals/Pain Pain Assessment Pain Assessment: 0-10 Pain Location: R hip, did not quantify todady Pain Descriptors / Indicators: Discomfort, Guarding Pain Intervention(s): Limited activity within patient's tolerance, Monitored during session, Repositioned, Premedicated before session    Home Living                          Prior Function            PT Goals (current goals can now be found in the care plan section) Progress towards PT goals: Progressing toward goals    Frequency    BID      PT Plan Current plan remains appropriate    Co-evaluation              AM-PAC PT "6 Clicks" Mobility   Outcome Measure  Help needed turning from your back to your side while in a flat bed without using bedrails?: A Little Help needed moving from lying on your back to sitting on the side of a flat bed without using bedrails?: A Little Help needed moving to and from a bed to a chair (including a wheelchair)?: A Little Help needed standing up from a chair using your arms (e.g., wheelchair or bedside chair)?: A Little Help needed to walk in hospital room?: A Lot Help needed climbing 3-5 steps with a railing? : A Lot 6 Click Score: 16    End of Session Equipment Utilized During Treatment: Gait belt Activity Tolerance: Patient tolerated treatment well Patient left: in chair;with nursing/sitter in room;Other (comment) (with MD and RN at bedside) Nurse Communication: Mobility status PT Visit Diagnosis: Muscle weakness (generalized) (M62.81);History of falling (Z91.81);Other abnormalities of gait and mobility (R26.89)     Time: 0865-7846 PT  Time Calculation (min) (ACUTE ONLY): 20 min  Charges:  $Therapeutic Activity: 8-22 mins                     Lieutenant Diego PT, DPT 10:22 AM,01/07/22

## 2022-01-07 NOTE — Progress Notes (Signed)
Subjective: 3 Days Post-Op Procedure(s) (LRB): INTRAMEDULLARY (IM) NAIL INTERTROCHANTRIC (Right) Right distal radius fracture requiring closed reduction and splinting Pain is mild in the right wrist, moderate pain in the right thigh this morning. Denies any CP, SOB, ABD pain. We will continue therapy today.  Patient is performing poorly with PT however patient does not have insurance and is unable to afford SNF.  Plan is going to be for discharge home with hopeful HHPT thru [facility].  Objective: Vital signs in last 24 hours: Temp:  [98.2 F (36.8 C)-99.9 F (37.7 C)] 98.9 F (37.2 C) (06/20 0716) Pulse Rate:  [78-100] 100 (06/20 0716) Resp:  [15-16] 16 (06/20 0716) BP: (93-135)/(61-69) 93/67 (06/20 0716) SpO2:  [93 %-99 %] 96 % (06/20 0716)  Intake/Output from previous day: 06/19 0701 - 06/20 0700 In: 1070 [P.O.:1070] Out: -  Intake/Output this shift: No intake/output data recorded.  Recent Labs    01/05/22 0345 01/06/22 0532  HGB 10.0* 10.8*   Recent Labs    01/05/22 0345 01/06/22 0532  WBC 10.7* 10.1  RBC 3.02* 3.19*  HCT 29.8* 32.0*  PLT 228 242   Recent Labs    01/05/22 0345  NA 134*  K 4.7  CL 102  CO2 26  BUN 38*  CREATININE 0.86  GLUCOSE 291*  CALCIUM 8.5*   No results for input(s): "LABPT", "INR" in the last 72 hours.   EXAM General - Patient is Alert, Appropriate, and Oriented Right upper extremity - Neurovascular intact Sensation intact distally.  Splint intact right upper extremity, minimal swelling throughout the digits. She is able to flex and extend her fingers this morning without pain. Right lower extremity-dressing is clean dry and intact.  Thigh is soft.  No swelling or edema throughout the right lower extremity.  Ankle plantarflexion dorsiflexion is intact.  Compartments soft. Motor Function - intact, moving foot and toes well on exam.  Abdomen soft with intact bowel sounds.  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   Constipation    Depression    Diabetes mellitus without complication (HCC)    Dysrhythmia     PAF   GERD (gastroesophageal reflux disease)    Hypertension    Neuropathy    Nicotine dependence 07/31/2020   PAF (paroxysmal atrial fibrillation) (HCC)    Panic attacks    Pneumonia    Tobacco use    Vitamin D deficiency     Assessment/Plan:   3 Days Post-Op Procedure(s) (LRB): INTRAMEDULLARY (IM) NAIL INTERTROCHANTRIC (Right) Principal Problem:   Closed intertrochanteric fracture of hip, right, initial encounter ([place]) Active Problems:   PAF (paroxysmal atrial fibrillation) (HCC)   Diabetes (HCC)   Chronic alcohol use   Nicotine dependence   Neuropathy   Hypertension associated with diabetes ([place])   Closed right radial fracture   Acute postoperative anemia due to expected blood loss  Estimated body mass index is 22.86 kg/m as calculated from the following:   Height as of this encounter: '5\' 2"'$  (1.575 m).   Weight as of this encounter: 56.7 kg. Advance diet Up with therapy, nonweightbearing right upper extremity.  Weightbearing as tolerated right lower extremity  Vitals reviewed this AM, low grade fever last night.  Denies any cough, SOB or chest pain this morning. Patient has  had a BM. Up with therapy today.  Currently recommending SNF however patient does not have insurance, plan will be for her to return home. Updated x-rays of the wrist were obtained today, no shifting of the distal radius fracture, will plan to continue to treat non-surgically at this time.  Continue Xarelto for DVT prophylaxis. Remain in splint, non-weightbearing to the right arm. Follow-up with [facility] next week for staple removal and splint removal with placement of a cast.  DVT Prophylaxis - Xarelto, TED hose,  and SCDs Weight-Bearing as tolerated to right leg Nonweightbearing right upper extremity  J. Cameron Proud, PA-C [place] 01/07/2022, 9:36 AM

## 2022-01-07 NOTE — Progress Notes (Addendum)
PT Contact Note  Patient suffers from R LE fracture which impairs their ability to perform daily activities like bathing, dressing, grooming, and toileting in the home.  A walker will not resolve  issue with performing activities of daily living. A wheelchair will allow patient to safely perform daily activities. Patient is not able to propel themselves in the home using a standard weight wheelchair due to arm weakness and general weakness. Patient can self propel in the lightweight wheelchair. Length of need 6 months . Accessories: elevating leg rests (ELRs), wheel locks, extensions and anti-tippers.  Anara Cowman H. Owens Shark, PT, DPT, NCS 01/07/22, 2:15 PM [phone]

## 2022-01-07 NOTE — Inpatient Diabetes Management (Signed)
Inpatient Diabetes Program Recommendations  AACE/ADA: New Consensus Statement on Inpatient Glycemic Control  Target Ranges:  Prepandial:   less than 140 mg/dL      Peak postprandial:   less than 180 mg/dL (1-2 hours)      Critically ill patients:  140 - 180 mg/dL    Latest Reference Range & Units 01/07/22 07:52  Glucose-Capillary 70 - 99 mg/dL 274 (H)    Latest Reference Range & Units 01/06/22 07:33 01/06/22 08:30 01/06/22 09:28 01/06/22 12:09 01/06/22 16:21 01/06/22 20:38  Glucose-Capillary 70 - 99 mg/dL 159 (H) 213 (H)  Novolog 8 units 311 (H)   Semglee 28 units  320 (H)  Novolog 19 units 75 99   Semglee 28 units   Review of Glycemic Control  Diabetes history: DM2 Outpatient Diabetes medications: Lantus 37 units daily, Novolog 4 units TID with meals, Ozempic 0.25 mg Qweek Current orders for Inpatient glycemic control: Semglee 28 units BID, Novolog 8 units TID with meals, Novolog 0-15 units AC&HS  Inpatient Diabetes Program Recommendations:    Insulin: Patient received one time Decadron 4 mg on 01/04/22 at 7:51 am which is contributing to hyperglycemia. CBG down to 75 mg/dl before supper last night and no meal coverage with supper (even though ate 100%) and CBG 99 mg/dl at bedtime.  Please consider decreasing meal coverage to Novolog 5 units TID with meals. If patient has any issues with hypoglycemia, will need to decrease basal insulin.  Thanks, Barnie Alderman, RN, MSN, [place] Diabetes Coordinator Inpatient Diabetes Program [phone] (Team Pager from 8am to [place])

## 2022-01-07 NOTE — Clinical Social Work Note (Cosign Needed)
    Durable Medical Equipment  (From admission, onward)           Start     Ordered   01/07/22 1415  For home use only DME lightweight manual wheelchair with seat cushion  Once       Comments: Patient suffers from R LE fracture which impairs their ability to perform daily activities like bathing, dressing, grooming, and toileting in the home.  A walker will not resolve  issue with performing activities of daily living. A wheelchair will allow patient to safely perform daily activities. Patient is not able to propel themselves in the home using a standard weight wheelchair due to arm weakness and general weakness. Patient can self propel in the lightweight wheelchair. Length of need 6 months . Accessories: elevating leg rests (ELRs), wheel locks, extensions and anti-tippers.   01/07/22 1414   01/06/22 1514  For home use only DME Walker rolling  Once       Comments: Right side platform  Question Answer Comment  Walker: With [place]   Patient needs a walker to treat with the following condition Difficulty walking      01/06/22 1513

## 2022-01-07 NOTE — Discharge Summary (Signed)
Physician Discharge Summary   Patient: Michaela Johnson MRN: [id number] DOB: Dec 25, 1960  Admit date:     01/02/2022  Discharge date: 01/07/22  Discharge Physician: Lorella Nimrod   PCP: [name], NP   Recommendations at discharge:  Please obtain CBC and BMP in 1 week Follow-up with primary care provider within a week Follow-up with orthopedic surgery in 2 weeks  Discharge Diagnoses: Principal Problem:   Closed intertrochanteric fracture of hip, right, initial encounter [facility]) Active Problems:   Closed right radial fracture   PAF (paroxysmal atrial fibrillation) ([place])   Diabetes ([place])   Hypertension associated with diabetes ([place])   Chronic alcohol use   Nicotine dependence   Neuropathy   Acute postoperative anemia due to expected blood loss   Hospital Course: Taken from H&P.  Michaela Johnson is a 61 y.o. female with a known history of anxiety, depression,  Afib on Xarelto, DM, EtOH with cirrhosis, HTN, GERD, osteoporosis presents to the emergency department for evaluation of right hip pain s/p mechanical fall this afternoon.  Patient was knocked over by her dog landed on her right hip and right arm.  She complains of right wrist and forearm pain as well as right hip pain.  She denies any preceding symptoms like dizziness, lightheadedness, loss of consciousness.    Patient denies fevers/chills, weakness, dizziness, chest pain, shortness of breath, N/V/C/D, abdominal pain, dysuria/frequency, changes in mental status.   She was found to have right intertrochanteric hip fracture and right radius fracture. Right radial fracture was reduced and splinted in ED. Orthopedic is on board and she will be going to the OR tomorrow, surgery was delayed due to her Xarelto use  6/16: Patient continued to have significant pain involving right upper and lower extremities. Labs pretty much unremarkable, A1c of 8.4, ethanol levels were less than 10. Patient most likely need SNF after the surgery-will  be a difficult disposition as she does not have insurance.  6/17: Patient underwent ORIF today.  Tolerated the procedure well.  6/18: No acute overnight event.  Restarting home Xarelto.  CBC with decrease of hemoglobin to 10 from 14.  Starting her on iron supplement.  CBG remained elevated-adjusting insulin  6/19: Patient remained stable, hemoglobin with some increased to 10.8.  PT is recommending rehab but patient does not have any insurance-TOC to help with displacement. CBG remained elevated although some improvement than before, continue to adjust insulin. Will be a very difficult placement per TOC. Discussed with patient and she can arrange some family or close friends to help her. We will try getting charitable home health services and potential discharge tomorrow if remains stable.  6/20: Patient remained stable.  1 episode of mild hypoglycemia after dinner with CBG of 75 noted, CBG elevated this morning.  Decreasing the mealtime coverage to 6 units. Home health services were arranged with charity. Patient does not want to involve her family, encouraged him to discuss with a friend or family member so they can help you at home.  Patient need supervision and help, unfortunately we cannot get her to a rehab place due to insurance issues.  Patient is being discharged home with home health services and will follow-up with her primary care provider and orthopedic surgery.  Patient will continue current medications and follow-up with her providers.  Assessment and Plan: * Closed intertrochanteric fracture of hip, right, initial encounter [facility]) Orthopedic was consulted s/p ORIF on 6/17.  Tolerated the procedure well Xarelto was initially held and now is being restarted. Echocardiogram  with normal EF and grade 1 diastolic dysfunction. -Continue with pain management. -Continue with supportive care  Closed right radial fracture S/p reduction and splint placement. -PT/OT is recommending  SNF. -Continue with pain management  PAF (paroxysmal atrial fibrillation) (HCC) Rate well controlled. -Continue diltiazem. -Home Xarelto is being switched with heparin for surgery  Atrial fibrillation with rapid ventricular response (HCC)-resolved as of 01/03/2022 Rate well controlled. -Continue with diltiazem -Eliquis was switched with heparin  Diabetes ([place]) Seems uncontrolled with hyperglycemia and A1c of 8.4. Patient was on Jardiance and Lantus at home.  CBG remained elevated, now above 200, some improvement than before -Increase Semglee to 28 units twice daily -Increase mealtime coverage to 8 units -Continue with SSI  Hypertension associated with diabetes ([place]) Blood pressure mildly elevated. -Continue home lisinopril and Cardizem  Chronic alcohol use Ethanol levels less than 10. -Monitor with CIWA  Nicotine dependence - Nicotine patch as needed  Neuropathy - Continue with home Neurontin  Acute postoperative anemia due to expected blood loss Hemoglobin dropped to 10>>10.8 today, from 14. Continue iron supplement -Monitor hemoglobin -Transfuse below 7    Pain control - [place] Controlled Substance Reporting System database was reviewed. and patient was instructed, not to drive, operate heavy machinery, perform activities at heights, swimming or participation in water activities or provide baby-sitting services while on Pain, Sleep and Anxiety Medications; until their outpatient Physician has advised to do so again. Also recommended to not to take more than prescribed Pain, Sleep and Anxiety Medications.  Consultants: Orthopedic surgery Procedures performed: ORIF of right hip and splinting of right upper extremity Disposition: Home health Diet recommendation:  Discharge Diet Orders (From admission, onward)     Start     Ordered   01/07/22 0000  Diet - low sodium heart healthy        01/07/22 1114           Cardiac diet DISCHARGE  MEDICATION: Allergies as of 01/07/2022       Reactions   Glipizide    Other reaction(s): Other (See Comments)   Metformin Diarrhea   Codeine Itching   Levaquin [levofloxacin In D5w] Itching        Medication List     STOP taking these medications    amLODipine-benazepril 5-40 MG capsule Commonly known as: LOTREL   lisinopril 40 MG tablet Commonly known as: ZESTRIL       TAKE these medications    acetaminophen 500 MG tablet Commonly known as: TYLENOL Take 1-2 tablets (500-1,000 mg total) by mouth every 6 (six) hours as needed for moderate pain.   albuterol 108 (90 Base) MCG/ACT inhaler Commonly known as: VENTOLIN HFA Inhale 2 puffs into the lungs every 6 (six) hours as needed for wheezing or shortness of breath.   diltiazem 180 MG 24 hr capsule Commonly known as: CARDIZEM CD Take 1 capsule (180 mg total) by mouth daily.   diphenhydrAMINE 12.5 MG/5ML liquid Commonly known as: BENADRYL Take 25 mg by mouth 4 (four) times daily as needed for itching.   docusate sodium 100 MG capsule Commonly known as: COLACE Take 1 capsule (100 mg total) by mouth 2 (two) times daily.   ferrous [id number] C-folic acid capsule Commonly known as: TRINSICON / FOLTRIN Take 1 capsule by mouth 3 (three) times daily after meals.   FLUoxetine 40 MG capsule Commonly known as: PROZAC Take 40 mg by mouth daily.   gabapentin 300 MG capsule Commonly known as: NEURONTIN Take 600 mg by mouth at bedtime  as needed (Neuropathy).   insulin aspart 100 UNIT/ML injection Commonly known as: novoLOG Inject 4 Units into the skin 3 (three) times daily with meals. Short-acting insulin   Jardiance 10 MG Tabs tablet Generic drug: empagliflozin Take 10 mg by mouth daily.   Lantus SoloStar 100 UNIT/ML Solostar Pen Generic drug: insulin glargine Inject 37 Units into the skin daily. This is your home dose of long-acting insulin. What changed: how much to take   multivitamin with minerals  Tabs tablet Take 1 tablet by mouth daily.   nicotine 21 mg/24hr patch Commonly known as: NICODERM CQ - dosed in mg/24 hours Place 1 patch (21 mg total) onto the skin daily.   omeprazole 20 MG capsule Commonly known as: PRILOSEC Take 20 mg by mouth daily.   ondansetron 4 MG tablet Commonly known as: ZOFRAN Take 1 tablet (4 mg total) by mouth every 6 (six) hours as needed for nausea.   oxyCODONE 5 MG immediate release tablet Commonly known as: Oxy IR/ROXICODONE Take 1-2 tablets (5-10 mg total) by mouth every 4 (four) hours as needed for moderate pain (pain score 4-6).   rivaroxaban 20 MG Tabs tablet Commonly known as: XARELTO Take 1 tablet (20 mg total) by mouth daily with supper.   Vitamin D 125 MCG (5000 UT) Caps Take 1 capsule by mouth daily.               Durable Medical Equipment  (From admission, onward)           Start     Ordered   01/06/22 1514  For home use only DME Walker rolling  Once       Comments: Right side platform  Question Answer Comment  Walker: With [place] Wheels   Patient needs a walker to treat with the following condition Difficulty walking      01/06/22 1513              Discharge Care Instructions  (From admission, onward)           Start     Ordered   01/07/22 0000  Leave dressing on - Keep it clean, dry, and intact until clinic visit        01/07/22 1114            Follow-up Information     Lattie Corns, PA-C Follow up in 14 day(s).   Specialty: Physician Assistant Why: Electa Sniff information: [place] [employer] [address] [phone]                Discharge Exam: Filed Weights   01/02/22 1433  Weight: 56.7 kg   General.  Frail and malnourished lady, in no acute distress. Pulmonary.  Lungs clear bilaterally, normal respiratory effort. CV.  Regular rate and rhythm, no JVD, rub or murmur. Abdomen.  Soft, nontender, nondistended, BS positive. CNS.  Alert and  oriented .  No focal neurologic deficit. Extremities.  No edema, no cyanosis, pulses intact and symmetrical.RUE with splint. Psychiatry.  Judgment and insight appears normal.   Condition at discharge: stable  The results of significant diagnostics from this hospitalization (including imaging, microbiology, ancillary and laboratory) are listed below for reference.   Imaging Studies: DG Wrist Complete Right  Result Date: 01/07/2022 CLINICAL DATA:  RIGHT wrist fracture. EXAM: RIGHT WRIST - COMPLETE 3+ VIEW COMPARISON:  01/02/2022 FINDINGS: RIGHT breast is imaged through splinting material. There is comminuted fracture of the distal radius, associated with impaction. There is developing  callus at the fracture site. Alignment is unchanged. IMPRESSION: Developing callus at the distal radius fracture. Electronically Signed   By: Nolon Nations M.D.   On: 01/07/2022 08:51   DG HIP UNILAT WITH PELVIS 2-3 VIEWS RIGHT  Result Date: 01/04/2022 CLINICAL DATA:  Right hip surgery. EXAM: DG HIP (WITH OR WITHOUT PELVIS) 2-3V RIGHT COMPARISON:  01/02/2022 FINDINGS: Four intraoperative images of the right hip demonstrate placement of right femoral intramedullary nail with associated screw bridging patient's known trochanteric fracture into the femoral head. Hardware is intact with anatomic alignment over the fracture site. Remainder the exam is unremarkable. IMPRESSION: Intact fixation hardware bridging patient's right trochanteric fracture with anatomic alignment over the fracture site. Electronically Signed   By: Marin Olp M.D.   On: 01/04/2022 10:04   DG C-Arm 1-60 Min-No Report  Result Date: 01/04/2022 Fluoroscopy was utilized by the requesting physician.  No radiographic interpretation.   DG MINI C-ARM IMAGE ONLY  Result Date: 01/04/2022 There is no interpretation for this exam.  This order is for images obtained during a surgical procedure.  Please See "Surgeries" Tab for more information regarding  the procedure.   ECHOCARDIOGRAM COMPLETE  Result Date: 01/03/2022    ECHOCARDIOGRAM REPORT   Patient Name:   Michaela Johnson Date of Exam: 01/03/2022 Medical Rec #:  [id number]     Height:       62.0 in Accession #:    [id number]    Weight:       125.0 lb Date of Birth:  08-13-1960     BSA:          1.566 m Patient Age:    68 years      BP:           141/77 mmHg Patient Gender: F             HR:           99 bpm. Exam Location:  [facility] Procedure: 2D Echo, Cardiac Doppler and Color Doppler Indications:     Preoperative Evaluation  History:         Patient has prior history of Echocardiogram examinations, most                  recent 03/02/2018. Risk Factors:Diabetes. PAF, tobacco use.  Sonographer:     Sherrie Sport Referring Phys:  [id number] ALEXIS HUGELMEYER Diagnosing Phys: Yolonda Kida MD IMPRESSIONS  1. Left ventricular ejection fraction, by estimation, is 55 to 60%. The left ventricle has normal function. The left ventricle has no regional wall motion abnormalities. Left ventricular diastolic parameters are consistent with Grade I diastolic dysfunction (impaired relaxation).  2. Right ventricular systolic function is normal. The right ventricular size is normal.  3. The mitral valve is normal in structure. Trivial mitral valve regurgitation.  4. The aortic valve is normal in structure. Aortic valve regurgitation is not visualized. FINDINGS  Left Ventricle: Left ventricular ejection fraction, by estimation, is 55 to 60%. The left ventricle has normal function. The left ventricle has no regional wall motion abnormalities. The left ventricular internal cavity size was normal in size. There is  no left ventricular hypertrophy. Left ventricular diastolic parameters are consistent with Grade I diastolic dysfunction (impaired relaxation). Right Ventricle: The right ventricular size is normal. No increase in right ventricular wall thickness. Right ventricular systolic function is normal. Left Atrium: Left atrial size was  normal in size. Right Atrium: Right atrial size was normal in size. Pericardium: There is no evidence  of pericardial effusion. Mitral Valve: The mitral valve is normal in structure. Trivial mitral valve regurgitation. MV peak gradient, 6.1 mmHg. The mean mitral valve gradient is 2.0 mmHg. Tricuspid Valve: The tricuspid valve is grossly normal. Tricuspid valve regurgitation is trivial. Aortic Valve: The aortic valve is normal in structure. Aortic valve regurgitation is not visualized. Aortic valve mean gradient measures 3.5 mmHg. Aortic valve peak gradient measures 6.6 mmHg. Aortic valve area, by VTI measures 2.91 cm. Pulmonic Valve: The pulmonic valve was normal in structure. Pulmonic valve regurgitation is not visualized. Aorta: The ascending aorta was not well visualized. IAS/Shunts: No atrial level shunt detected by color flow Doppler.  LEFT VENTRICLE PLAX 2D LVIDd:         3.40 cm   Diastology LVIDs:         2.40 cm   LV e' medial:    5.77 cm/s LV PW:         1.00 cm   LV E/e' medial:  9.5 LV IVS:        0.75 cm   LV e' lateral:   7.29 cm/s LVOT diam:     2.00 cm   LV E/e' lateral: 7.5 LV SV:         60 LV SV Index:   38 LVOT Area:     3.14 cm  RIGHT VENTRICLE RV Basal diam:  2.00 cm RV S prime:     10.60 cm/s TAPSE (M-mode): 2.0 cm LEFT ATRIUM             Index        RIGHT ATRIUM           Index LA diam:        3.20 cm 2.04 cm/m   RA Area:     10.20 cm LA Vol (A2C):   52.4 ml 33.47 ml/m  RA Volume:   21.40 ml  13.67 ml/m LA Vol (A4C):   28.1 ml 17.95 ml/m LA Biplane Vol: 40.9 ml 26.12 ml/m  AORTIC VALVE                    PULMONIC VALVE AV Area (Vmax):    2.62 cm     PV Vmax:        1.16 m/s AV Area (Vmean):   2.66 cm     PV Vmean:       81.600 cm/s AV Area (VTI):     2.91 cm     PV VTI:         0.190 m AV Vmax:           128.50 cm/s  PV Peak grad:   5.4 mmHg AV Vmean:          85.700 cm/s  PV Mean grad:   3.0 mmHg AV VTI:            0.207 m      RVOT Peak grad: 5 mmHg AV Peak Grad:      6.6 mmHg AV  Mean Grad:      3.5 mmHg LVOT Vmax:         107.00 cm/s LVOT Vmean:        72.500 cm/s LVOT VTI:          0.191 m LVOT/AV VTI ratio: 0.92  AORTA Ao Root diam: 1.90 cm MITRAL VALVE                TRICUSPID VALVE MV Area (PHT): 5.62 cm  TR Peak grad:   13.2 mmHg MV Area VTI:   3.47 cm     TR Vmax:        182.00 cm/s MV Peak grad:  6.1 mmHg MV Mean grad:  2.0 mmHg     SHUNTS MV Vmax:       1.23 m/s     Systemic VTI:  0.19 m MV Vmean:      71.4 cm/s    Systemic Diam: 2.00 cm MV Decel Time: 135 msec     Pulmonic VTI:  0.205 m MV E velocity: 54.80 cm/s MV A velocity: 120.00 cm/s MV E/A ratio:  0.46 Dwayne D Callwood MD Electronically signed by Yolonda Kida MD Signature Date/Time: 01/03/2022/6:17:29 PM    Final    Chest Portable 1 View  Result Date: 01/02/2022 CLINICAL DATA:  Preop chest radiograph EXAM: PORTABLE CHEST 1 VIEW COMPARISON:  Chest radiograph dated 01/02/2022. FINDINGS: Mild diffuse chronic atrial coarsening and bronchitic changes. No focal consolidation, pleural effusion, or pneumothorax. The cardiac silhouette is within limits. Atherosclerotic calcification of the aorta. No acute osseous pathology. IMPRESSION: No active disease. Electronically Signed   By: Anner Crete M.D.   On: 01/02/2022 19:43   DG Wrist Complete Right  Result Date: 01/02/2022 CLINICAL DATA:  Postreduction film. Status post reduction of right wrist. EXAM: RIGHT WRIST - COMPLETE 3+ VIEW COMPARISON:  Right wrist radiographs 01/02/2022 FINDINGS: There is new overlying casting material that limits evaluation of fine bony detail. There is improved alignment of the distal radial metadiaphyseal and epiphyseal fracture, now with normal lateral and near anatomic frontal alignment. Minimal medial cortical step-off at the fracture line at the distal femoral metadiaphysis. Likely chronic nonunited ossicles of the ulnar styloid. Mild-to-moderate triscaphe and thumb carpometacarpal joint space narrowing. IMPRESSION: Interval  reduction of comminuted distal radial acute fracture with improved, near anatomic alignment. Electronically Signed   By: Yvonne Kendall M.D.   On: 01/02/2022 17:59   DG Chest Port 1 View  Result Date: 01/02/2022 CLINICAL DATA:  Fall, right hip and right wrist fractures. EXAM: PORTABLE CHEST 1 VIEW COMPARISON:  04/18/2021 chest CT and radiograph FINDINGS: Atherosclerotic calcification of the aortic arch. Old healed right posterolateral fourth and fifth rib fractures. Old displaced in nonunited mid clavicular fracture on the left. Old left anterior rib fractures. Subtle irregularity in the right mid clavicle is noted, but this appears relatively similar to 04/18/2021, and accordingly is likely not an acute fracture. Bony demineralization is present. The lungs appear clear. Heart size within normal limits. IMPRESSION: 1. Old bilateral rib fractures and old displaced nonunited left clavicular fracture. 2.  Aortic Atherosclerosis (ICD10-I70.0). Electronically Signed   By: Van Clines M.D.   On: 01/02/2022 15:45   DG Wrist 2 Views Right  Result Date: 01/02/2022 CLINICAL DATA:  Fall, right wrist pain and deformity. EXAM: RIGHT WRIST - 2 VIEW COMPARISON:  None Available. FINDINGS: Distal radial fracture with transverse component in the metaphysis and a longitudinal component extending to the distal radial articular surface. Deformity of the ulnar styloid with multiple well corticated ossicles, cannot exclude new acute fracture at the base of the ulnar styloid. Degenerative loss of articular space at the first MCP joint. IMPRESSION: 1. Distal radial mildly comminuted fracture with transverse metaphyseal component and longitudinal component extending to the distal radial articular surface. 2. Deformity of the ulnar styloid with multiple well corticated ossicles, cannot exclude acute fracture through the base of the ulnar styloid. 3. Degenerative findings at the first MCP joint.  Electronically Signed   By: Van Clines M.D.   On: 01/02/2022 15:41   DG HIP UNILAT WITH PELVIS 2-3 VIEWS RIGHT  Result Date: 01/02/2022 CLINICAL DATA:  Fall.  Right hip pain. EXAM: DG HIP (WITH OR WITHOUT PELVIS) 2-3V RIGHT COMPARISON:  CT scan 04/18/2021 FINDINGS: Intertrochanteric right hip fracture with mild varus angulation. Chronic bone island in the right iliac bone. Bony demineralization.  Vascular calcifications noted. IMPRESSION: 1. Acute right intertrochanteric hip fracture with mild varus angulation. Electronically Signed   By: Van Clines M.D.   On: 01/02/2022 15:39    Microbiology: Results for orders placed or performed during the hospital encounter of 04/18/21  Resp Panel by RT-PCR (Flu A&B, Covid) Nasopharyngeal Swab     Status: None   Collection Time: 04/18/21  3:34 PM   Specimen: Nasopharyngeal Swab; Nasopharyngeal(NP) swabs in vial transport medium  Result Value Ref Range Status   SARS Coronavirus 2 by RT PCR NEGATIVE NEGATIVE Final    Comment: (NOTE) SARS-CoV-2 target nucleic acids are NOT DETECTED.  The SARS-CoV-2 RNA is generally detectable in upper respiratory specimens during the acute phase of infection. The lowest concentration of SARS-CoV-2 viral copies this assay can detect is 138 copies/mL. A negative result does not preclude SARS-Cov-2 infection and should not be used as the sole basis for treatment or other patient management decisions. A negative result may occur with  improper specimen collection/handling, submission of specimen other than nasopharyngeal swab, presence of viral mutation(s) within the areas targeted by this assay, and inadequate number of viral copies(<138 copies/mL). A negative result must be combined with clinical observations, patient history, and epidemiological information. The expected result is Negative.  Fact Sheet for Patients:  EntrepreneurPulse.com.au  Fact Sheet for Healthcare Providers:   IncredibleEmployment.be  This test is no t yet approved or cleared by the [place] FDA and  has been authorized for detection and/or diagnosis of SARS-CoV-2 by FDA under an Emergency Use Authorization (EUA). This EUA will remain  in effect (meaning this test can be used) for the duration of the COVID-19 declaration under Section 564(b)(1) of the Act, 21 U.S.C.section 360bbb-3(b)(1), unless the authorization is terminated  or revoked sooner.       Influenza A by PCR NEGATIVE NEGATIVE Final   Influenza B by PCR NEGATIVE NEGATIVE Final    Comment: (NOTE) The Xpert Xpress SARS-CoV-2/FLU/RSV plus assay is intended as an aid in the diagnosis of influenza from Nasopharyngeal swab specimens and should not be used as a sole basis for treatment. Nasal washings and aspirates are unacceptable for Xpert Xpress SARS-CoV-2/FLU/RSV testing.  Fact Sheet for Patients: EntrepreneurPulse.com.au  Fact Sheet for Healthcare Providers: IncredibleEmployment.be  This test is not yet approved or cleared by the [place] FDA and has been authorized for detection and/or diagnosis of SARS-CoV-2 by FDA under an Emergency Use Authorization (EUA). This EUA will remain in effect (meaning this test can be used) for the duration of the COVID-19 declaration under Section 564(b)(1) of the Act, 21 U.S.C. section 360bbb-3(b)(1), unless the authorization is terminated or revoked.  Performed at [facility], [address] [place], [place] [address]   Blood culture (routine x 2)     Status: None   Collection Time: 04/18/21  5:59 PM   Specimen: BLOOD  Result Value Ref Range Status   Specimen Description BLOOD BLOOD LEFT HAND  Final   Special Requests   Final    BOTTLES DRAWN AEROBIC ONLY Blood Culture adequate volume  Culture   Final    NO GROWTH 5 DAYS Performed at [facility], [address] [place], [place] [address]     Report Status 04/23/2021 FINAL  Final  Urine Culture     Status: None   Collection Time: 04/18/21  6:25 PM   Specimen: Urine, Random  Result Value Ref Range Status   Specimen Description   Final    URINE, RANDOM Performed at [facility], [address], [place], [place] [address]    Special Requests   Final    NONE Performed at [facility], [address] [place], [place] [address]    Culture   Final    NO GROWTH Performed at [facility], [place] [address], [place], [place] [address]    Report Status 04/19/2021 FINAL  Final  Blood culture (routine x 2)     Status: None   Collection Time: 04/18/21  7:25 PM   Specimen: BLOOD  Result Value Ref Range Status   Specimen Description BLOOD BLOOD LEFT HAND  Final   Special Requests   Final    BOTTLES DRAWN AEROBIC ONLY Blood Culture results may not be optimal due to an inadequate volume of blood received in culture bottles   Culture   Final    NO GROWTH 5 DAYS Performed at [facility], [address] [place], [place] [address]    Report Status 04/23/2021 FINAL  Final    Labs: CBC: Recent Labs  Lab 01/02/22 1627 01/03/22 0646 01/05/22 0345 01/06/22 0532  WBC 11.8* 10.6* 10.7* 10.1  NEUTROABS 10.3*  --   --   --   HGB 14.5 14.5 10.0* 10.8*  HCT 44.5 44.5 29.8* 32.0*  MCV 100.2* 99.3 98.7 100.3*  PLT 181 285 228 093   Basic Metabolic Panel: Recent Labs  Lab 01/02/22 1729 01/03/22 0646 01/05/22 0345  NA 137 137 134*  K 4.3 4.2 4.7  CL 103 104 102  CO2 '25 26 26  '$ GLUCOSE 262* 100* 291*  BUN 26* 20 38*  CREATININE 0.84 0.48 0.86  CALCIUM 9.1 9.6 8.5*   Liver Function Tests: Recent Labs  Lab 01/02/22 1729  AST 17  ALT 17  ALKPHOS 79  BILITOT 0.7  PROT 7.6  ALBUMIN 3.9   CBG: Recent Labs  Lab 01/06/22 0928 01/06/22 1209 01/06/22 1621 01/06/22 2038 01/07/22 0752  GLUCAP 311* 320* 75 99 274*    Discharge time spent: greater than 30 minutes.  This record has been  created using Systems analyst. Errors have been sought and corrected,but may not always be located. Such creation errors do not reflect on the standard of care.   Signed: Lorella Nimrod, MD [facility] 01/07/2022

## 2022-01-14 NOTE — Progress Notes (Unsigned)
Office Visit    Patient Name: Michaela Johnson Date of Encounter: 01/16/2022  Primary Care Provider:  [name], NP Primary Cardiologist:  Ida Rogue, MD Primary Electrophysiologist: None  Chief Complaint    Michaela Johnson is a 61 y.o. female with PMH of A-fib on Xarelto, IDDM with history of DKA, osteoporosis, HTN, GERD, LBBB, GI bleed alcohol abuse with cirrhosis, anxiety presents today for follow-up of hypertension.  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 07/30/2020   Constipation    Depression    Diabetes mellitus without complication (HCC)    Dysrhythmia     PAF   GERD (gastroesophageal reflux disease)    Hypertension    Neuropathy    Nicotine dependence 07/31/2020   PAF (paroxysmal atrial fibrillation) (HCC)    Panic attacks    Pneumonia    Tobacco use    Vitamin D deficiency    Past Surgical History:  Procedure Laterality Date   ANKLE FUSION Right 07/30/2020   Procedure: ANKLE FUSION;  Surgeon: Altamese [place], MD;  Location: [facility];  Service: Orthopedics;  Laterality: Right;   APPLICATION OF WOUND VAAPPLICATION OF WOUND VAC (Right Ankle)C (Right Ankle)  25/36/6440   APPLICATION OF WOUND VAC Right 07/30/2020   Procedure: APPLICATION OF WOUND VAC;  Surgeon: Altamese Rogers, MD;  Location: [facility];  Service: Orthopedics;  Laterality: Right;   HARDWARE REMOVAL Right 07/30/2020   Procedure: HARDWARE REMOVAL RIGHT ANKLE;  Surgeon: Altamese Loyall, MD;  Location: [facility];  Service: Orthopedics;  Laterality: Right;   HARDWARE REMOVAL RIGHT ANKLE (Right Ankle  07/31/2020   INTRAMEDULLARY (IM) NAIL INTERTROCHANTERIC Right 01/04/2022   Procedure: INTRAMEDULLARY (IM) NAIL INTERTROCHANTRIC;  Surgeon: Corky Mull, MD;  Location: [facility];  Service: Orthopedics;  Laterality:  Right;   ORIF ANKLE FRACTURE Right 07/04/2020   Procedure: OPEN REDUCTION INTERNAL FIXATION (ORIF) ANKLE FRACTURE;  Surgeon: Lovell Sheehan, MD;  Location: [facility];  Service: Orthopedics;  Laterality: Right;    Allergies  Allergies  Allergen Reactions   Glipizide     Other reaction(s): Other (See Comments)   Metformin Diarrhea   Codeine Itching   Levaquin [Levofloxacin In D5w] Itching    History of Present Illness    Michaela Johnson is a 61 year old female with the above-mentioned past medical history who presents today for follow-up of elevated blood pressure following recent hospitalization.  She was initially seen in 01/2018 during hospitalization for DKA and AF with RVR that dates back to 2011.  She converted to sinus rhythm with IV Cardizem drip and resolution of DKA with insulin drip fluid replacement.    She was seen by Dr. Rockey Situ to establish care in 02/2018.  She was seen in the ED on 01/2019 with complaint of syncope possibly related to vasovagal following diarrhea and hematochezia.  She was referred to GI for follow-up however did not complete follow-up as planned.  She was continued on anticoagulation with instructions to hold if hematochezia returns.  In 11/2019 she was seen again in the ED after falling and sustaining a fractured left clavicle.  She was seen again in follow-up at which time she denied any symptoms of bleeding or recent falls.  Her blood pressure was mildly elevated and her Cardizem was  titrated to 240 mg daily and lisinopril 40 mg was continued.  She was last seen in follow-up by Dr. Rockey Situ on 04/2021 following recent hospitalization for DKA.  During visit she reported weight loss of 12 pounds.  She reported also frequent dizziness when standing.  She was noted to have some hypotension due to possible confusion with blood pressure medication.  She reportedly was taking amlodipine/benazepril in addition to lisinopril and Cardizem. She was recently seen in the ED for  evaluation of right hip pain following mechanical fall after falling over her dog.  She was found to have right intertrochanteric hip fracture and right radius fracture.  She underwent ORIF and closed reduction of radial fracture.  Her blood pressure was noted to be mildly elevated and she was discharged with home health.  Since last being seen in the office patient reports that she is doing well and recovering from her recent hospitalization from 6/15-6/20. She is not currently undergoing PT due to lack of insurance and states that she has a good friend that is helping her with recovery and getting around.  Medications were reviewed today and patient confirmed compliance with regimen. She denies chest pain, palpitations, dyspnea, PND, orthopnea, nausea, vomiting, dizziness, syncope, edema, weight gain, or early satiety.  Home Medications    Current Outpatient Medications  Medication Sig Dispense Refill   amLODipine-benazepril (LOTREL) 5-40 MG capsule Take 1 capsule by mouth daily.     acetaminophen (TYLENOL) 500 MG tablet Take 1-2 tablets (500-1,000 mg total) by mouth every 6 (six) hours as needed for moderate pain. (Patient not taking: Reported on 01/16/2022) 30 tablet 0   albuterol (PROVENTIL HFA;VENTOLIN HFA) 108 (90 Base) MCG/ACT inhaler Inhale 2 puffs into the lungs every 6 (six) hours as needed for wheezing or shortness of breath. (Patient not taking: Reported on 01/03/2022) 1 Inhaler 2   Cholecalciferol (VITAMIN D) 125 MCG (5000 UT) CAPS Take 1 capsule by mouth daily. (Patient not taking: Reported on 01/16/2022) 30 capsule 5   diltiazem (CARDIZEM CD) 180 MG 24 hr capsule Take 1 capsule (180 mg total) by mouth daily. (Patient not taking: Reported on 01/16/2022) 90 capsule 3   diphenhydrAMINE (BENADRYL) 12.5 MG/5ML liquid Take 25 mg by mouth 4 (four) times daily as needed for itching. (Patient not taking: Reported on 01/16/2022)     docusate sodium (COLACE) 100 MG capsule Take 1 capsule (100 mg total)  by mouth 2 (two) times daily. (Patient not taking: Reported on 01/16/2022) 30 capsule 0   ferrous [id number] C-folic acid (TRINSICON / FOLTRIN) capsule Take 1 capsule by mouth 3 (three) times daily after meals. (Patient not taking: Reported on 01/16/2022) 90 capsule 1   FLUoxetine (PROZAC) 40 MG capsule Take 40 mg by mouth daily. (Patient not taking: Reported on 01/16/2022)     gabapentin (NEURONTIN) 300 MG capsule Take 600 mg by mouth at bedtime as needed (Neuropathy). (Patient not taking: Reported on 01/16/2022)     insulin aspart (NOVOLOG) 100 UNIT/ML injection Inject 4 Units into the skin 3 (three) times daily with meals. Short-acting insulin (Patient not taking: Reported on 01/16/2022) 10.8 mL 0   JARDIANCE 10 MG TABS tablet Take 10 mg by mouth daily. (Patient not taking: Reported on 01/16/2022)     LANTUS SOLOSTAR 100 UNIT/ML Solostar Pen Inject 37 Units into the skin daily. This is your home dose of long-acting insulin. (Patient not taking: Reported on 01/16/2022) 15 mL 11   Multiple Vitamin (MULTIVITAMIN WITH MINERALS) TABS tablet Take 1 tablet  by mouth daily. (Patient not taking: Reported on 01/16/2022) 90 tablet 1   nicotine (NICODERM CQ - DOSED IN MG/24 HOURS) 21 mg/24hr patch Place 1 patch (21 mg total) onto the skin daily. (Patient not taking: Reported on 01/16/2022) 28 patch 0   omeprazole (PRILOSEC) 20 MG capsule Take 20 mg by mouth daily. (Patient not taking: Reported on 01/16/2022)     ondansetron (ZOFRAN) 4 MG tablet Take 1 tablet (4 mg total) by mouth every 6 (six) hours as needed for nausea. (Patient not taking: Reported on 01/16/2022) 30 tablet 0   oxyCODONE (OXY IR/ROXICODONE) 5 MG immediate release tablet Take 1-2 tablets (5-10 mg total) by mouth every 4 (four) hours as needed for moderate pain (pain score 4-6). (Patient not taking: Reported on 01/16/2022) 30 tablet 0   rivaroxaban (XARELTO) 20 MG TABS tablet Take 1 tablet (20 mg total) by mouth daily with supper. (Patient not  taking: Reported on 01/16/2022) 30 tablet 11   No current facility-administered medications for this visit.     Review of Systems  Please see the history of present illness.    (+)Right wrist pain (+)Right hip pain  All other systems reviewed and are otherwise negative except as noted above.  Physical Exam    Wt Readings from Last 3 Encounters:  01/16/22 117 lb 4 oz (53.2 kg)  01/02/22 125 lb (56.7 kg)  04/24/21 138 lb 9.6 oz (62.9 kg)   VS: Vitals:   01/16/22 0841  BP: 118/60  Pulse: 88  SpO2: 98%  ,Body mass index is 21.45 kg/m.  Constitutional:      Appearance: Healthy appearance. Not in distress.  Neck:     Vascular: JVD normal.  Pulmonary:     Effort: Pulmonary effort is normal.     Breath sounds: No wheezing. No rales. Diminished in the bases Cardiovascular:     Normal rate. Regular rhythm. Normal S1. Normal S2.      Murmurs: There is no murmur.  Edema:    Peripheral edema absent.  Abdominal:     Palpations: Abdomen is soft non tender. There is no hepatomegaly.  Skin:    General: Skin is warm and dry.  Neurological:     General: No focal deficit present.     Mental Status: Alert and oriented to person, place and time.     Cranial Nerves: Cranial nerves are intact.  EKG/LABS/Other Studies Reviewed    ECG personally reviewed by me today -sinus rhythm with left bundle branch block and ventricular rate of 88 with no acute changes compared to previous EKG.  Risk Assessment/Calculations:    CHA2DS2-VASc Score = 3   This indicates a 3.2% annual risk of stroke. The patient's score is based upon: CHF History: 0 HTN History: 1 Diabetes History: 1 Stroke History: 0 Vascular Disease History: 0 Age Score: 0 Gender Score: 1      Lab Results  Component Value Date   WBC 10.1 01/06/2022   HGB 10.8 (L) 01/06/2022   HCT 32.0 (L) 01/06/2022   MCV 100.3 (H) 01/06/2022   PLT 242 01/06/2022   Lab Results  Component Value Date   CREATININE 0.86 01/05/2022    BUN 38 (H) 01/05/2022   NA 134 (L) 01/05/2022   K 4.7 01/05/2022   CL 102 01/05/2022   CO2 26 01/05/2022   Lab Results  Component Value Date   ALT 17 01/02/2022   AST 17 01/02/2022   ALKPHOS 79 01/02/2022   BILITOT 0.7 01/02/2022  Lab Results  Component Value Date   CHOL 136 01/30/2018   HDL 73 01/30/2018   LDLCALC 42 01/30/2018   TRIG 106 01/30/2018   CHOLHDL 1.9 01/30/2018    Lab Results  Component Value Date   HGBA1C 8.4 (H) 01/02/2022    Assessment & Plan    1.  Paroxysmal atrial fibrillation: -Patient is rate controlled today in sinus rhythm.  -Continue Cardizem 180 mg daily -Patient denies any bleeding events with Xarelto. -Continue Xarelto 20 mg daily -Dose appropriate based on creatinine clearance of 66.85 mL/min  2.  Hypertension: -Patient's blood pressure today is well controlled at 118/60.  She reports one systolic reading in the high 70s at home and on recheck systolic was in the low 163A. -Advised her to discontinue lisinopril and continue Cardizem as noted above. -She was instructed to check blood pressures at home and we will plan to resume lisinopril if systolic blood pressure is greater than 130/80  3.  History of GI bleed: -Patient denies any hematuria or melena in stool -Continue current anticoagulant as noted above  4.  Tobacco abuse: -Smoking cessation advised and patient encouraged to use nicotine patches as prescribed  Disposition: Follow-up with Ida Rogue, MD or APP in 12 months   Medication Adjustments/Labs and Tests Ordered: Current medicines are reviewed at length with the patient today.  Concerns regarding medicines are outlined above.   Signed, Mable Fill, Marissa Nestle, NP 01/16/2022, 9:38 AM [place]

## 2022-01-16 ENCOUNTER — Ambulatory Visit (INDEPENDENT_AMBULATORY_CARE_PROVIDER_SITE_OTHER): Payer: Self-pay | Admitting: Nurse Practitioner

## 2022-01-16 ENCOUNTER — Encounter: Payer: Self-pay | Admitting: Nurse Practitioner

## 2022-01-16 VITALS — BP 118/60 | HR 88 | Ht 62.0 in | Wt 117.2 lb

## 2022-01-16 DIAGNOSIS — I10 Essential (primary) hypertension: Secondary | ICD-10-CM

## 2022-01-16 DIAGNOSIS — I480 Paroxysmal atrial fibrillation: Secondary | ICD-10-CM

## 2022-01-16 DIAGNOSIS — Z8719 Personal history of other diseases of the digestive system: Secondary | ICD-10-CM

## 2022-01-16 DIAGNOSIS — Z720 Tobacco use: Secondary | ICD-10-CM

## 2022-01-16 NOTE — Patient Instructions (Signed)
Medication Instructions:  No changes at this time.   *If you need a refill on your cardiac medications before your next appointment, please call your pharmacy*   Lab Work: None  If you have labs (blood work) drawn today and your tests are completely normal, you will receive your results only by: [place] (if you have MyChart) OR A paper copy in the mail If you have any lab test that is abnormal or we need to change your treatment, we will call you to review the results.   Testing/Procedures: None   Follow-Up: At [facility], you and your health needs are our priority.  As part of our continuing mission to provide you with exceptional heart care, we have created designated Provider Care Teams.  These Care Teams include your primary Cardiologist (physician) and Advanced Practice Providers (APPs -  Physician Assistants and Nurse Practitioners) who all work together to provide you with the care you need, when you need it.  We recommend signing up for the patient portal called "MyChart".  Sign up information is provided on this After Visit Summary.  MyChart is used to connect with patients for Virtual Visits (Telemedicine).  Patients are able to view lab/test results, encounter notes, upcoming appointments, etc.  Non-urgent messages can be sent to your provider as well.   To learn more about what you can do with MyChart, go to [email or web address].    Your next appointment:   1 year(s)  The format for your next appointment:   In Person  Provider:   Ida Rogue, MD or Murray Hodgkins, NP       Important Information About Sugar

## 2022-05-01 ENCOUNTER — Ambulatory Visit: Payer: Self-pay | Admitting: Advanced Practice Midwife

## 2022-05-01 ENCOUNTER — Encounter: Payer: Self-pay | Admitting: Advanced Practice Midwife

## 2022-05-01 DIAGNOSIS — Z113 Encounter for screening for infections with a predominantly sexual mode of transmission: Secondary | ICD-10-CM

## 2022-05-01 DIAGNOSIS — F17200 Nicotine dependence, unspecified, uncomplicated: Secondary | ICD-10-CM | POA: Insufficient documentation

## 2022-05-01 DIAGNOSIS — F325 Major depressive disorder, single episode, in full remission: Secondary | ICD-10-CM

## 2022-05-01 DIAGNOSIS — F1290 Cannabis use, unspecified, uncomplicated: Secondary | ICD-10-CM | POA: Insufficient documentation

## 2022-05-01 LAB — WET PREP FOR TRICH, YEAST, CLUE
Trichomonas Exam: NEGATIVE
Yeast Exam: NEGATIVE

## 2022-05-01 NOTE — Progress Notes (Signed)
Wet prep reviewed, no treatment indicated.Jenetta Downer, RN

## 2022-05-01 NOTE — Progress Notes (Signed)
[facility] Department  STI clinic/screening visit [place] [address] [phone]  Subjective:  Michaela Johnson is a 61 y.o. seperated WF smoker G2P1 female being seen today for an STI screening visit. The patient reports they do have symptoms.  Patient reports that they do not desire a pregnancy in the next year.   They reported they are not interested in discussing contraception today.    No LMP recorded. Patient is postmenopausal.   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 Last sex 04/29/22 without condom; with current partner x 3 mo. Last MJ yesterday. Last ETOH (3 glasses liquor) daily. Smoking 4 cpd, last vaped 10 years ago. Last cigar 5 years ago. LMP age 39.   Last HIV test per patient/review of record was 04/04/22 Patient reports last pap was 2021 per pt and neg  Screening for MPX risk: Does the patient have an unexplained rash? No Is the patient MSM? No Does the  patient endorse multiple sex partners or anonymous sex partners? No Did the patient have close or sexual contact with a person diagnosed with MPX? No Has the patient traveled outside the [place] where MPX is endemic? No Is there a high clinical suspicion for MPX-- evidenced by one of the following No  -Unlikely to be chickenpox  -Lymphadenopathy  -Rash that present in same phase of evolution on any given body part See flowsheet for further details and programmatic requirements.   Immunization history:  Immunization History  Administered Date(s) Administered   Influenza,inj,Quad PF,6+ Mos 06/10/2016, 05/30/2017, 05/27/2018, 04/20/2021   Pneumococcal Polysaccharide-23 06/10/2016     The following portions of the patient's history were reviewed and updated as appropriate: allergies, current medications, past medical history, past social history, past surgical history and problem list.  Objective:  There were no vitals filed for this visit.  Physical Exam Vitals and nursing note reviewed.  Constitutional:      Appearance: Normal appearance.  HENT:     Head: Normocephalic and atraumatic.     Mouth/Throat:     Mouth: Mucous membranes are moist.     Pharynx: Oropharynx is clear. No oropharyngeal exudate or posterior oropharyngeal erythema.     Comments: False teeth Eyes:     Conjunctiva/sclera: Conjunctivae normal.  Neck:     Thyroid: No thyroid mass, thyromegaly or thyroid tenderness.  Pulmonary:     Effort: Pulmonary effort is normal.  Abdominal:     Palpations: Abdomen is soft. There is no mass.  Tenderness: There is no abdominal tenderness. There is no rebound.     Comments: Soft without masses or tenderness  Genitourinary:    General: Normal vulva.     Exam position: Lithotomy position.     Pubic Area: No rash or pubic lice.      Labia:        Right: No rash or lesion.        Left: No rash or lesion.      Vagina: Normal. No vaginal discharge (white creamy leaukorrhea,  ph<4.5), erythema, bleeding or lesions.     Cervix: Normal.     Uterus: Normal.      Adnexa: Right adnexa normal and left adnexa normal.     Rectum: Normal.     Comments: pH = <4.5 Lymphadenopathy:     Head:     Right side of head: No preauricular or posterior auricular adenopathy.     Left side of head: No preauricular or posterior auricular adenopathy.     Cervical: No cervical adenopathy.     Upper Body:     Right upper body: No supraclavicular, axillary or epitrochlear adenopathy.     Left upper body: No supraclavicular, axillary or epitrochlear adenopathy.     Lower Body: No right inguinal adenopathy. No left inguinal adenopathy.  Skin:    General: Skin is warm and dry.     Findings: No rash.  Neurological:     Mental Status: She is alert and oriented to person, place, and time.     Assessment and Plan:  Michaela Johnson is a 61 y.o. female presenting to the [facility] Department for STI screening  1. Screening examination for venereal disease Treat wet mount per standing orders Immunization nurse consult  - WET PREP FOR [place], YEAST, [place] culture  2. Smoker Counseled via 5 A's to stop  smoking     Return if symptoms worsen or fail to improve.  No future appointments.  Herbie Saxon, CNM

## 2022-05-06 LAB — GONOCOCCUS CULTURE

## 2022-05-11 ENCOUNTER — Other Ambulatory Visit: Payer: Self-pay | Admitting: Cardiovascular Disease

## 2022-06-01 ENCOUNTER — Inpatient Hospital Stay
Admission: EM | Admit: 2022-06-01 | Discharge: 2022-06-04 | DRG: 637 | Discharge status: Against Medical Advice | Payer: Self-pay | Attending: Internal Medicine | Admitting: Internal Medicine

## 2022-06-01 ENCOUNTER — Emergency Department: Payer: Self-pay

## 2022-06-01 DIAGNOSIS — Z5329 Procedure and treatment not carried out because of patient's decision for other reasons: Secondary | ICD-10-CM | POA: Diagnosis present

## 2022-06-01 DIAGNOSIS — T68XXXA Hypothermia, initial encounter: Secondary | ICD-10-CM

## 2022-06-01 DIAGNOSIS — F1910 Other psychoactive substance abuse, uncomplicated: Secondary | ICD-10-CM | POA: Diagnosis present

## 2022-06-01 DIAGNOSIS — R739 Hyperglycemia, unspecified: Secondary | ICD-10-CM

## 2022-06-01 DIAGNOSIS — F17210 Nicotine dependence, cigarettes, uncomplicated: Secondary | ICD-10-CM | POA: Diagnosis present

## 2022-06-01 DIAGNOSIS — E860 Dehydration: Secondary | ICD-10-CM | POA: Diagnosis present

## 2022-06-01 DIAGNOSIS — I480 Paroxysmal atrial fibrillation: Secondary | ICD-10-CM | POA: Diagnosis present

## 2022-06-01 DIAGNOSIS — J9601 Acute respiratory failure with hypoxia: Secondary | ICD-10-CM | POA: Diagnosis present

## 2022-06-01 DIAGNOSIS — Z1152 Encounter for screening for COVID-19: Secondary | ICD-10-CM

## 2022-06-01 DIAGNOSIS — B9620 Unspecified Escherichia coli [E. coli] as the cause of diseases classified elsewhere: Secondary | ICD-10-CM | POA: Diagnosis present

## 2022-06-01 DIAGNOSIS — S52501K Unspecified fracture of the lower end of right radius, subsequent encounter for closed fracture with nonunion: Secondary | ICD-10-CM

## 2022-06-01 DIAGNOSIS — R579 Shock, unspecified: Principal | ICD-10-CM

## 2022-06-01 DIAGNOSIS — F1490 Cocaine use, unspecified, uncomplicated: Secondary | ICD-10-CM | POA: Insufficient documentation

## 2022-06-01 DIAGNOSIS — N170 Acute kidney failure with tubular necrosis: Secondary | ICD-10-CM | POA: Diagnosis present

## 2022-06-01 DIAGNOSIS — E1110 Type 2 diabetes mellitus with ketoacidosis without coma: Principal | ICD-10-CM | POA: Diagnosis present

## 2022-06-01 DIAGNOSIS — I152 Hypertension secondary to endocrine disorders: Secondary | ICD-10-CM | POA: Diagnosis present

## 2022-06-01 DIAGNOSIS — Z8719 Personal history of other diseases of the digestive system: Secondary | ICD-10-CM

## 2022-06-01 DIAGNOSIS — E119 Type 2 diabetes mellitus without complications: Secondary | ICD-10-CM

## 2022-06-01 DIAGNOSIS — E1159 Type 2 diabetes mellitus with other circulatory complications: Secondary | ICD-10-CM | POA: Diagnosis present

## 2022-06-01 DIAGNOSIS — K219 Gastro-esophageal reflux disease without esophagitis: Secondary | ICD-10-CM | POA: Diagnosis present

## 2022-06-01 DIAGNOSIS — Z8249 Family history of ischemic heart disease and other diseases of the circulatory system: Secondary | ICD-10-CM

## 2022-06-01 DIAGNOSIS — E1169 Type 2 diabetes mellitus with other specified complication: Secondary | ICD-10-CM | POA: Diagnosis present

## 2022-06-01 DIAGNOSIS — G928 Other toxic encephalopathy: Secondary | ICD-10-CM | POA: Diagnosis present

## 2022-06-01 DIAGNOSIS — Z794 Long term (current) use of insulin: Secondary | ICD-10-CM

## 2022-06-01 DIAGNOSIS — R451 Restlessness and agitation: Secondary | ICD-10-CM | POA: Diagnosis present

## 2022-06-01 DIAGNOSIS — E875 Hyperkalemia: Secondary | ICD-10-CM

## 2022-06-01 DIAGNOSIS — E861 Hypovolemia: Secondary | ICD-10-CM | POA: Diagnosis present

## 2022-06-01 DIAGNOSIS — R680 Hypothermia, not associated with low environmental temperature: Secondary | ICD-10-CM | POA: Diagnosis present

## 2022-06-01 DIAGNOSIS — R8271 Bacteriuria: Secondary | ICD-10-CM | POA: Diagnosis present

## 2022-06-01 DIAGNOSIS — Z833 Family history of diabetes mellitus: Secondary | ICD-10-CM

## 2022-06-01 DIAGNOSIS — R4182 Altered mental status, unspecified: Secondary | ICD-10-CM

## 2022-06-01 DIAGNOSIS — Z885 Allergy status to narcotic agent status: Secondary | ICD-10-CM

## 2022-06-01 DIAGNOSIS — T405X1A Poisoning by cocaine, accidental (unintentional), initial encounter: Secondary | ICD-10-CM | POA: Diagnosis present

## 2022-06-01 DIAGNOSIS — X58XXXD Exposure to other specified factors, subsequent encounter: Secondary | ICD-10-CM | POA: Diagnosis present

## 2022-06-01 DIAGNOSIS — J22 Unspecified acute lower respiratory infection: Secondary | ICD-10-CM | POA: Diagnosis present

## 2022-06-01 DIAGNOSIS — F1090 Alcohol use, unspecified, uncomplicated: Secondary | ICD-10-CM | POA: Diagnosis present

## 2022-06-01 DIAGNOSIS — K7460 Unspecified cirrhosis of liver: Secondary | ICD-10-CM | POA: Insufficient documentation

## 2022-06-01 DIAGNOSIS — F1010 Alcohol abuse, uncomplicated: Secondary | ICD-10-CM | POA: Diagnosis present

## 2022-06-01 DIAGNOSIS — F410 Panic disorder [episodic paroxysmal anxiety] without agoraphobia: Secondary | ICD-10-CM | POA: Diagnosis present

## 2022-06-01 DIAGNOSIS — Z881 Allergy status to other antibiotic agents status: Secondary | ICD-10-CM

## 2022-06-01 DIAGNOSIS — N179 Acute kidney failure, unspecified: Secondary | ICD-10-CM

## 2022-06-01 DIAGNOSIS — Z888 Allergy status to other drugs, medicaments and biological substances status: Secondary | ICD-10-CM

## 2022-06-01 DIAGNOSIS — J189 Pneumonia, unspecified organism: Secondary | ICD-10-CM | POA: Diagnosis present

## 2022-06-01 DIAGNOSIS — F17200 Nicotine dependence, unspecified, uncomplicated: Secondary | ICD-10-CM | POA: Diagnosis present

## 2022-06-01 DIAGNOSIS — E1140 Type 2 diabetes mellitus with diabetic neuropathy, unspecified: Secondary | ICD-10-CM | POA: Diagnosis present

## 2022-06-01 DIAGNOSIS — E876 Hypokalemia: Secondary | ICD-10-CM | POA: Diagnosis present

## 2022-06-01 DIAGNOSIS — R569 Unspecified convulsions: Secondary | ICD-10-CM

## 2022-06-01 DIAGNOSIS — Z79899 Other long term (current) drug therapy: Secondary | ICD-10-CM

## 2022-06-01 DIAGNOSIS — F32A Depression, unspecified: Secondary | ICD-10-CM | POA: Diagnosis present

## 2022-06-01 DIAGNOSIS — F14129 Cocaine abuse with intoxication, unspecified: Secondary | ICD-10-CM | POA: Diagnosis present

## 2022-06-01 DIAGNOSIS — Z7984 Long term (current) use of oral hypoglycemic drugs: Secondary | ICD-10-CM

## 2022-06-01 DIAGNOSIS — R578 Other shock: Secondary | ICD-10-CM | POA: Diagnosis present

## 2022-06-01 DIAGNOSIS — Z7901 Long term (current) use of anticoagulants: Secondary | ICD-10-CM

## 2022-06-01 LAB — CBC WITH DIFFERENTIAL/PLATELET
Abs Immature Granulocytes: 0.15 10*3/uL — ABNORMAL HIGH (ref 0.00–0.07)
Basophils Absolute: 0.1 10*3/uL (ref 0.0–0.1)
Basophils Relative: 1 %
Eosinophils Absolute: 0 10*3/uL (ref 0.0–0.5)
Eosinophils Relative: 0 %
HCT: 45.5 % (ref 36.0–46.0)
Hemoglobin: 13.4 g/dL (ref 12.0–15.0)
Immature Granulocytes: 1 %
Lymphocytes Relative: 8 %
Lymphs Abs: 1.7 10*3/uL (ref 0.7–4.0)
MCH: 33.4 pg (ref 26.0–34.0)
MCHC: 29.5 g/dL — ABNORMAL LOW (ref 30.0–36.0)
MCV: 113.5 fL — ABNORMAL HIGH (ref 80.0–100.0)
Monocytes Absolute: 1.2 10*3/uL — ABNORMAL HIGH (ref 0.1–1.0)
Monocytes Relative: 6 %
Neutro Abs: 17.4 10*3/uL — ABNORMAL HIGH (ref 1.7–7.7)
Neutrophils Relative %: 84 %
Platelets: 385 10*3/uL (ref 150–400)
RBC: 4.01 MIL/uL (ref 3.87–5.11)
RDW: 12.1 % (ref 11.5–15.5)
WBC: 20.6 10*3/uL — ABNORMAL HIGH (ref 4.0–10.5)
nRBC: 0 % (ref 0.0–0.2)

## 2022-06-01 LAB — BASIC METABOLIC PANEL
Anion gap: 17 — ABNORMAL HIGH (ref 5–15)
Anion gap: 8 (ref 5–15)
Anion gap: 7 (ref 5–15)
BUN: 52 mg/dL — ABNORMAL HIGH (ref 8–23)
BUN: 54 mg/dL — ABNORMAL HIGH (ref 8–23)
BUN: 49 mg/dL — ABNORMAL HIGH (ref 8–23)
BUN: 45 mg/dL — ABNORMAL HIGH (ref 8–23)
BUN: 37 mg/dL — ABNORMAL HIGH (ref 8–23)
CO2: <7 mmol/L — ABNORMAL LOW (ref 22–32)
CO2: <7 mmol/L — ABNORMAL LOW (ref 22–32)
CO2: 13 mmol/L — ABNORMAL LOW (ref 22–32)
CO2: 21 mmol/L — ABNORMAL LOW (ref 22–32)
CO2: 24 mmol/L (ref 22–32)
Calcium: 9.1 mg/dL (ref 8.9–10.3)
Calcium: 9.4 mg/dL (ref 8.9–10.3)
Calcium: 9.5 mg/dL (ref 8.9–10.3)
Calcium: 9.4 mg/dL (ref 8.9–10.3)
Calcium: 9 mg/dL (ref 8.9–10.3)
Chloride: 94 mmol/L — ABNORMAL LOW (ref 98–111)
Chloride: 96 mmol/L — ABNORMAL LOW (ref 98–111)
Chloride: 104 mmol/L (ref 98–111)
Chloride: 108 mmol/L (ref 98–111)
Chloride: 109 mmol/L (ref 98–111)
Creatinine, Ser: 1.92 mg/dL — ABNORMAL HIGH (ref 0.44–1.00)
Creatinine, Ser: 1.93 mg/dL — ABNORMAL HIGH (ref 0.44–1.00)
Creatinine, Ser: 1.6 mg/dL — ABNORMAL HIGH (ref 0.44–1.00)
Creatinine, Ser: 1.23 mg/dL — ABNORMAL HIGH (ref 0.44–1.00)
Creatinine, Ser: 0.98 mg/dL (ref 0.44–1.00)
GFR, Estimated: 29 mL/min — ABNORMAL LOW (ref >60)
GFR, Estimated: 29 mL/min — ABNORMAL LOW (ref >60)
GFR, Estimated: 36 mL/min — ABNORMAL LOW (ref >60)
GFR, Estimated: 49 mL/min — ABNORMAL LOW (ref >60)
GFR, Estimated: >60 mL/min (ref >60)
Glucose, Bld: 856 mg/dL (ref 70–99)
Glucose, Bld: 716 mg/dL (ref 70–99)
Glucose, Bld: 469 mg/dL — ABNORMAL HIGH (ref 70–99)
Glucose, Bld: 281 mg/dL — ABNORMAL HIGH (ref 70–99)
Glucose, Bld: 326 mg/dL — ABNORMAL HIGH (ref 70–99)
Potassium: 5.3 mmol/L — ABNORMAL HIGH (ref 3.5–5.1)
Potassium: 5.3 mmol/L — ABNORMAL HIGH (ref 3.5–5.1)
Potassium: 4.3 mmol/L (ref 3.5–5.1)
Potassium: 4.1 mmol/L (ref 3.5–5.1)
Potassium: 3.9 mmol/L (ref 3.5–5.1)
Sodium: 124 mmol/L — ABNORMAL LOW (ref 135–145)
Sodium: 128 mmol/L — ABNORMAL LOW (ref 135–145)
Sodium: 134 mmol/L — ABNORMAL LOW (ref 135–145)
Sodium: 137 mmol/L (ref 135–145)
Sodium: 140 mmol/L (ref 135–145)

## 2022-06-01 LAB — URINALYSIS, COMPLETE (UACMP) WITH MICROSCOPIC
Bilirubin Urine: NEGATIVE
Glucose, UA: >=500 mg/dL — AB
Hgb urine dipstick: NEGATIVE
Ketones, ur: 20 mg/dL — AB
Leukocytes,Ua: NEGATIVE
Nitrite: NEGATIVE
Protein, ur: 30 mg/dL — AB
Specific Gravity, Urine: 1.02 (ref 1.005–1.030)
Squamous Epithelial / HPF: NONE SEEN (ref 0–5)
pH: 5 (ref 5.0–8.0)

## 2022-06-01 LAB — COMPREHENSIVE METABOLIC PANEL
ALT: 18 U/L (ref 0–44)
AST: 29 U/L (ref 15–41)
Albumin: 4 g/dL (ref 3.5–5.0)
Alkaline Phosphatase: 234 U/L — ABNORMAL HIGH (ref 38–126)
BUN: 61 mg/dL — ABNORMAL HIGH (ref 8–23)
CO2: <7 mmol/L — ABNORMAL LOW (ref 22–32)
Calcium: 9.4 mg/dL (ref 8.9–10.3)
Chloride: 87 mmol/L — ABNORMAL LOW (ref 98–111)
Creatinine, Ser: 2.21 mg/dL — ABNORMAL HIGH (ref 0.44–1.00)
GFR, Estimated: 24 mL/min — ABNORMAL LOW (ref >60)
Glucose, Bld: 967 mg/dL (ref 70–99)
Potassium: 7.3 mmol/L (ref 3.5–5.1)
Sodium: 121 mmol/L — ABNORMAL LOW (ref 135–145)
Total Bilirubin: 2.5 mg/dL — ABNORMAL HIGH (ref 0.3–1.2)
Total Protein: 8.4 g/dL — ABNORMAL HIGH (ref 6.5–8.1)

## 2022-06-01 LAB — URINE DRUG SCREEN, QUALITATIVE (ARMC ONLY)
Amphetamines, Ur Screen: NOT DETECTED
Barbiturates, Ur Screen: NOT DETECTED
Benzodiazepine, Ur Scrn: NOT DETECTED
Cannabinoid 50 Ng, Ur ~~LOC~~: NOT DETECTED
Cocaine Metabolite,Ur ~~LOC~~: POSITIVE — AB
MDMA (Ecstasy)Ur Screen: NOT DETECTED
Methadone Scn, Ur: NOT DETECTED
Opiate, Ur Screen: NOT DETECTED
Phencyclidine (PCP) Ur S: NOT DETECTED
Tricyclic, Ur Screen: NOT DETECTED

## 2022-06-01 LAB — LACTIC ACID, PLASMA
Lactic Acid, Venous: 2.4 mmol/L (ref 0.5–1.9)
Lactic Acid, Venous: 2.4 mmol/L (ref 0.5–1.9)
Lactic Acid, Venous: 1.2 mmol/L (ref 0.5–1.9)

## 2022-06-01 LAB — GLUCOSE, CAPILLARY
Glucose-Capillary: >600 mg/dL (ref 70–99)
Glucose-Capillary: 436 mg/dL — ABNORMAL HIGH (ref 70–99)
Glucose-Capillary: >600 mg/dL (ref 70–99)
Glucose-Capillary: 483 mg/dL — ABNORMAL HIGH (ref 70–99)
Glucose-Capillary: 550 mg/dL (ref 70–99)
Glucose-Capillary: 438 mg/dL — ABNORMAL HIGH (ref 70–99)
Glucose-Capillary: 482 mg/dL — ABNORMAL HIGH (ref 70–99)
Glucose-Capillary: 373 mg/dL — ABNORMAL HIGH (ref 70–99)
Glucose-Capillary: 338 mg/dL — ABNORMAL HIGH (ref 70–99)
Glucose-Capillary: 103 mg/dL — ABNORMAL HIGH (ref 70–99)
Glucose-Capillary: 181 mg/dL — ABNORMAL HIGH (ref 70–99)
Glucose-Capillary: 331 mg/dL — ABNORMAL HIGH (ref 70–99)
Glucose-Capillary: 324 mg/dL — ABNORMAL HIGH (ref 70–99)
Glucose-Capillary: 321 mg/dL — ABNORMAL HIGH (ref 70–99)
Glucose-Capillary: 181 mg/dL — ABNORMAL HIGH (ref 70–99)
Glucose-Capillary: 157 mg/dL — ABNORMAL HIGH (ref 70–99)
Glucose-Capillary: 167 mg/dL — ABNORMAL HIGH (ref 70–99)

## 2022-06-01 LAB — CBC
HCT: 35.9 % — ABNORMAL LOW (ref 36.0–46.0)
Hemoglobin: 12 g/dL (ref 12.0–15.0)
MCH: 33.9 pg (ref 26.0–34.0)
MCHC: 33.4 g/dL (ref 30.0–36.0)
MCV: 101.4 fL — ABNORMAL HIGH (ref 80.0–100.0)
Platelets: 309 10*3/uL (ref 150–400)
RBC: 3.54 MIL/uL — ABNORMAL LOW (ref 3.87–5.11)
RDW: 11.9 % (ref 11.5–15.5)
WBC: 20.3 10*3/uL — ABNORMAL HIGH (ref 4.0–10.5)
nRBC: 0 % (ref 0.0–0.2)

## 2022-06-01 LAB — CBG MONITORING, ED
Glucose-Capillary: >600 mg/dL (ref 70–99)
Glucose-Capillary: >600 mg/dL (ref 70–99)
Glucose-Capillary: >600 mg/dL (ref 70–99)

## 2022-06-01 LAB — BETA-HYDROXYBUTYRIC ACID
Beta-Hydroxybutyric Acid: >8 mmol/L — ABNORMAL HIGH (ref 0.05–0.27)
Beta-Hydroxybutyric Acid: >8 mmol/L — ABNORMAL HIGH (ref 0.05–0.27)
Beta-Hydroxybutyric Acid: 1.83 mmol/L — ABNORMAL HIGH (ref 0.05–0.27)

## 2022-06-01 LAB — SARS CORONAVIRUS 2 BY RT PCR: SARS Coronavirus 2 by RT PCR: NEGATIVE

## 2022-06-01 LAB — TYPE AND SCREEN
ABO/RH(D): O POS
Antibody Screen: NEGATIVE

## 2022-06-01 LAB — CHOLESTEROL, TOTAL: Cholesterol: 157 mg/dL (ref 0–200)

## 2022-06-01 LAB — OSMOLALITY: Osmolality: 341 mOsm/kg (ref 275–295)

## 2022-06-01 LAB — MRSA NEXT GEN BY PCR, NASAL: MRSA by PCR Next Gen: NOT DETECTED

## 2022-06-01 LAB — APTT: aPTT: 31 seconds (ref 24–36)

## 2022-06-01 LAB — CK: Total CK: 206 U/L (ref 38–234)

## 2022-06-01 LAB — ACETAMINOPHEN LEVEL: Acetaminophen (Tylenol), Serum: <10 ug/mL — ABNORMAL LOW (ref 10–30)

## 2022-06-01 LAB — TROPONIN I (HIGH SENSITIVITY)
Troponin I (High Sensitivity): 18 ng/L — ABNORMAL HIGH (ref <18)
Troponin I (High Sensitivity): 19 ng/L — ABNORMAL HIGH (ref <18)

## 2022-06-01 LAB — PROCALCITONIN: Procalcitonin: 2.39 ng/mL

## 2022-06-01 LAB — MAGNESIUM: Magnesium: 2.4 mg/dL (ref 1.7–2.4)

## 2022-06-01 LAB — PROTIME-INR
INR: 1.7 — ABNORMAL HIGH (ref 0.8–1.2)
Prothrombin Time: 20.2 seconds — ABNORMAL HIGH (ref 11.4–15.2)

## 2022-06-01 LAB — ETHANOL: Alcohol, Ethyl (B): <10 mg/dL (ref <10)

## 2022-06-01 LAB — SALICYLATE LEVEL: Salicylate Lvl: <7 mg/dL — ABNORMAL LOW (ref 7.0–30.0)

## 2022-06-01 LAB — BRAIN NATRIURETIC PEPTIDE: B Natriuretic Peptide: 555.3 pg/mL — ABNORMAL HIGH (ref 0.0–100.0)

## 2022-06-01 LAB — PHOSPHORUS: Phosphorus: 2.3 mg/dL — ABNORMAL LOW (ref 2.5–4.6)

## 2022-06-01 MED ORDER — THIAMINE HCL 100 MG/ML IJ SOLN: 100.0000 mg | Freq: Every day | INTRAMUSCULAR | Status: DC

## 2022-06-01 MED ORDER — CHLORHEXIDINE GLUCONATE CLOTH 2 % EX PADS
6.0000 | MEDICATED_PAD | Freq: Every day | CUTANEOUS | Status: DC
  Administered 2022-06-01 – 2022-06-03 (×3): 6 via TOPICAL

## 2022-06-01 MED ORDER — VANCOMYCIN HCL IN DEXTROSE 1-5 GM/200ML-% IV SOLN
1000.0000 mg | Freq: Once | INTRAVENOUS | Status: AC
  Administered 2022-06-01: 1000 mg via INTRAVENOUS
  Filled 2022-06-01: qty 200

## 2022-06-01 MED ORDER — LACTATED RINGERS IV SOLN: INTRAVENOUS | Status: DC

## 2022-06-01 MED ORDER — ATROPINE SULFATE 1 MG/10ML IJ SOSY
1.0000 mg | PREFILLED_SYRINGE | Freq: Once | INTRAMUSCULAR | Status: AC
  Administered 2022-06-01: 1 mg via INTRAVENOUS

## 2022-06-01 MED ORDER — LORAZEPAM 1 MG PO TABS
1.0000 mg | ORAL_TABLET | ORAL | Status: AC | PRN
  Filled 2022-06-01: qty 1

## 2022-06-01 MED ORDER — INSULIN REGULAR(HUMAN) IN NACL 100-0.9 UT/100ML-% IV SOLN
INTRAVENOUS | Status: DC
  Administered 2022-06-01: 2.8 [IU]/h via INTRAVENOUS
  Filled 2022-06-01: qty 100

## 2022-06-01 MED ORDER — LORAZEPAM 2 MG/ML IJ SOLN
1.0000 mg | INTRAMUSCULAR | Status: AC | PRN
  Administered 2022-06-01 – 2022-06-02 (×4): 2 mg via INTRAVENOUS
  Filled 2022-06-01 (×4): qty 1

## 2022-06-01 MED ORDER — NOREPINEPHRINE 4 MG/250ML-% IV SOLN: 0.0000 ug/min | INTRAVENOUS | Status: DC

## 2022-06-01 MED ORDER — CALCIUM GLUCONATE 10 % IV SOLN
1.0000 g | Freq: Once | INTRAVENOUS | Status: AC
  Administered 2022-06-01: 1 g via INTRAVENOUS
  Filled 2022-06-01: qty 10

## 2022-06-01 MED ORDER — NOREPINEPHRINE 4 MG/250ML-% IV SOLN
INTRAVENOUS | Status: AC
  Administered 2022-06-01: 5 ug/min via INTRAVENOUS
  Filled 2022-06-01: qty 250

## 2022-06-01 MED ORDER — HEPARIN SODIUM (PORCINE) 5000 UNIT/ML IJ SOLN
5000.0000 [IU] | Freq: Three times a day (TID) | INTRAMUSCULAR | Status: DC
  Administered 2022-06-01 – 2022-06-03 (×7): 5000 [IU] via SUBCUTANEOUS
  Filled 2022-06-01 (×7): qty 1

## 2022-06-01 MED ORDER — NOREPINEPHRINE 16 MG/250ML-% IV SOLN
0.0000 ug/min | INTRAVENOUS | Status: DC
  Administered 2022-06-01: 2 ug/min via INTRAVENOUS
  Administered 2022-06-01: 7.5 ug/min via INTRAVENOUS
  Filled 2022-06-01: qty 250

## 2022-06-01 MED ORDER — DEXTROSE 50 % IV SOLN: 0.0000 mL | INTRAVENOUS | Status: DC | PRN

## 2022-06-01 MED ORDER — LACTATED RINGERS IV BOLUS
1000.0000 mL | Freq: Once | INTRAVENOUS | Status: AC
  Administered 2022-06-01: 1000 mL via INTRAVENOUS

## 2022-06-01 MED ORDER — INSULIN ASPART 100 UNIT/ML IV SOLN
10.0000 [IU] | Freq: Once | INTRAVENOUS | Status: AC
  Administered 2022-06-01: 10 [IU] via INTRAVENOUS
  Filled 2022-06-01: qty 0.1

## 2022-06-01 MED ORDER — SODIUM CHLORIDE 0.9 % IV BOLUS (SEPSIS)
1000.0000 mL | Freq: Once | INTRAVENOUS | Status: AC
  Administered 2022-06-01: 1000 mL via INTRAVENOUS

## 2022-06-01 MED ORDER — INSULIN ASPART 100 UNIT/ML IJ SOLN
20.0000 [IU] | Freq: Once | INTRAMUSCULAR | Status: AC
  Administered 2022-06-01: 20 [IU] via SUBCUTANEOUS
  Filled 2022-06-01: qty 1

## 2022-06-01 MED ORDER — DEXTROSE IN LACTATED RINGERS 5 % IV SOLN: INTRAVENOUS | Status: DC

## 2022-06-01 MED ORDER — DOCUSATE SODIUM 100 MG PO CAPS: 100.0000 mg | ORAL_CAPSULE | Freq: Two times a day (BID) | ORAL | Status: DC | PRN

## 2022-06-01 MED ORDER — ATROPINE SULFATE 1 MG/10ML IJ SOSY
PREFILLED_SYRINGE | INTRAMUSCULAR | Status: AC
  Filled 2022-06-01: qty 10

## 2022-06-01 MED ORDER — SODIUM PHOSPHATES 45 MMOLE/15ML IV SOLN
15.0000 mmol | Freq: Once | INTRAVENOUS | Status: AC
  Administered 2022-06-01: 15 mmol via INTRAVENOUS
  Filled 2022-06-01: qty 5

## 2022-06-01 MED ORDER — SODIUM CHLORIDE 0.9 % IV BOLUS: 1000.0000 mL | Freq: Once | INTRAVENOUS | Status: DC

## 2022-06-01 MED ORDER — SODIUM CHLORIDE 0.9 % IV SOLN
2.0000 g | Freq: Once | INTRAVENOUS | Status: AC
  Administered 2022-06-01: 2 g via INTRAVENOUS
  Filled 2022-06-01: qty 12.5

## 2022-06-01 MED ORDER — LACTATED RINGERS IV BOLUS
2000.0000 mL | Freq: Once | INTRAVENOUS | Status: AC
  Administered 2022-06-01: 2000 mL via INTRAVENOUS

## 2022-06-01 MED ORDER — ORAL CARE MOUTH RINSE: 15.0000 mL | OROMUCOSAL | Status: DC | PRN

## 2022-06-01 MED ORDER — FUROSEMIDE 10 MG/ML IJ SOLN
40.0000 mg | Freq: Once | INTRAMUSCULAR | Status: AC
  Administered 2022-06-01: 40 mg via INTRAVENOUS
  Filled 2022-06-01: qty 4

## 2022-06-01 MED ORDER — PIPERACILLIN-TAZOBACTAM 3.375 G IVPB
3.3750 g | Freq: Three times a day (TID) | INTRAVENOUS | Status: DC
  Administered 2022-06-01 – 2022-06-02 (×3): 3.375 g via INTRAVENOUS
  Filled 2022-06-01 (×3): qty 50

## 2022-06-01 MED ORDER — POLYETHYLENE GLYCOL 3350 17 G PO PACK: 17.0000 g | PACK | Freq: Every day | ORAL | Status: DC | PRN

## 2022-06-01 MED ORDER — FOLIC ACID 5 MG/ML IJ SOLN
1.0000 mg | Freq: Every day | INTRAMUSCULAR | Status: DC
  Administered 2022-06-01 – 2022-06-02 (×2): 1 mg via INTRAVENOUS
  Filled 2022-06-01 (×3): qty 0.2

## 2022-06-01 MED ORDER — THIAMINE HCL 100 MG/ML IJ SOLN
500.0000 mg | Freq: Every day | INTRAVENOUS | Status: DC
  Administered 2022-06-01 – 2022-06-02 (×2): 500 mg via INTRAVENOUS
  Filled 2022-06-01 (×3): qty 5

## 2022-06-01 MED ORDER — IPRATROPIUM-ALBUTEROL 0.5-2.5 (3) MG/3ML IN SOLN: 3.0000 mL | Freq: Four times a day (QID) | RESPIRATORY_TRACT | Status: DC | PRN

## 2022-06-01 MED ORDER — FAMOTIDINE IN NACL 20-0.9 MG/50ML-% IV SOLN
20.0000 mg | INTRAVENOUS | Status: DC
  Administered 2022-06-01 – 2022-06-02 (×2): 20 mg via INTRAVENOUS
  Filled 2022-06-01 (×2): qty 50

## 2022-06-01 NOTE — Inpatient Diabetes Management (Signed)
Inpatient Diabetes Program Recommendations  AACE/ADA: New Consensus Statement on Inpatient Glycemic Control (2015)  Target Ranges:  Prepandial:   less than 140 mg/dL      Peak postprandial:   less than 180 mg/dL (1-2 hours)      Critically ill patients:  140 - 180 mg/dL   Lab Results  Component Value Date   GLUCAP >600 (HH) 06/01/2022   HGBA1C 8.4 (H) 01/02/2022    Review of Glycemic Control  Latest Reference Range & Units 06/01/22 04:24  CO2 22 - 32 mmol/L <7 (L)  Glucose 70 - 99 mg/dL 967 (HH)  Anion gap 5 - 15  NOT CALCULATED  (HH): Data is critically high (L): Data is abnormally low  Diabetes history: DM2 Outpatient Diabetes medications: Lantus 37 units qd, Novolog 3 units tid, Jardiance 10 mg qd-not taking per med rec Current orders for Inpatient glycemic control: IV insulin  Referral received for IV insulin.  B-met q4h and BHB q8h ordered.  Appears she has not been taking medications possibly due to having no insurance.  Placed TOC consult for assistance with meds and pcp.  Will speak to patient when appropriate and will follow glucose trends.    Will continue to follow while inpatient.  Thank you, Reche Dixon, MSN, [place] Diabetes Coordinator Inpatient Diabetes Program [phone] (team pager from 8a-5p)

## 2022-06-01 NOTE — ED Triage Notes (Signed)
Pt found in floor at home by neighbor.  Last known normal 20 hours ago.  CBG per EMS >550.  Pt is altered at this time.

## 2022-06-01 NOTE — TOC CM/SW Note (Signed)
TOC consults for SA resources and "No insurance, requires insulin-has not been taking.  DKA/HHS"  Checked with bedside RN and chart review. Patient not fully oriented. Per flag in chart, patient has a legal guardian, however it is not listed who. Per RN, patient did not specify, just asked that info not be shared with her husband.   TOC will follow up when patient is more oriented. TOC handoff updated.  Oleh Genin, [place]

## 2022-06-01 NOTE — ED Notes (Signed)
Report received from [place], [place]

## 2022-06-01 NOTE — ED Notes (Signed)
Advised nurse that patient has ready bed 

## 2022-06-01 NOTE — Consult Note (Signed)
PHARMACY CONSULT NOTE - FOLLOW UP  Pharmacy Consult for Electrolyte Monitoring and Replacement   Recent Labs: Potassium (mmol/L)  Date Value  06/01/2022 5.3 (H)   Magnesium (mg/dL)  Date Value  04/20/2021 2.1   Calcium (mg/dL)  Date Value  06/01/2022 9.4   Albumin (g/dL)  Date Value  06/01/2022 4.0   Phosphorus (mg/dL)  Date Value  04/19/2021 3.8   Sodium (mmol/L)  Date Value  06/01/2022 128 (L)     Assessment: 61 yo female who presented to [facility] ER via EMS on 11/12 with unresponsiveness.  She was found by her neighbor on the floor unresponsive with last known well time 20 hrs before being found. BG elevated on insulin infusion. Monitoring BMP q4H.   Fluid: LR @ 75 ml/hr.  Lasix 40 mg IV x 1  Goal of Therapy:  WNL  Plan:  No replacement needed at this time.  F/u with AM labs.   Oswald Hillock ,PharmD Clinical Pharmacist 06/01/2022 10:36 AM

## 2022-06-01 NOTE — ED Provider Notes (Signed)
[facility] Provider Note    Event Date/Time   First MD Initiated Contact with Patient 06/01/22 [phone]     (approximate)   History   Altered Mental Status   HPI  Michaela Johnson is a 61 y.o. 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  She was not seen for 1 whole day and a friend did a safety check on her and found her on the ground.  Patient is altered and not providing much of a history of what had happened.  EMS her blood glucose read high and she was hypotensive 70s over 40s.  Bradycardic in the 40s.  History was obtained via the patient and independent historian EMS for collateral information as above.  Also reviewed external medical notes including a discharge summary dated 01/07/2022 when she presented to the emergency department for hip fracture status postsurgical repair.      Physical Exam   Triage Vital Signs: ED Triage Vitals  Enc Vitals Group     BP      Pulse      Resp      Temp      Temp src      SpO2      Weight      Height      Head Circumference      Peak Flow      Pain Score      Pain Loc      Pain Edu?      Excl. in [place]?     Most recent vital signs: Vitals:   06/01/22 0645 06/01/22 0700  BP: (!) 111/58   Pulse: 86 87  Resp: 20 (!) 29  Temp: (!) 89.4 F (31.9 C) (!) 89.7 F (32.1 C)  SpO2: 100% 100%    General: Awake and maintaining airway, disoriented, answers questions.  She is moving all extremities with full active range of motion.  No obvious traumatic injuries on my examination. CV:  Mucous membranes and poor skin turgor looks clinically dehydrated. Resp:  Normal effort.  Clear to auscultation. Abd:  No distention.  Nontender without rigidity. Other:  There is a small lump on her right wrist, full active range of motion neurovascular intact.  Pupils equal round and reactive midrange, extraocular movements  intact and she is moving all extremities.   ED Results / Procedures / Treatments   Labs (all labs ordered are listed, but only abnormal results are displayed) Labs Reviewed  LACTIC ACID, PLASMA - Abnormal; Notable for the following components:      Result Value   Lactic Acid, Venous 2.4 (*)    All other components within normal limits  COMPREHENSIVE METABOLIC PANEL - Abnormal; Notable for the following components:   Sodium 121 (*)    Potassium 7.3 (*)    Chloride 87 (*)    CO2 <7 (*)    Glucose, Bld 967 (*)    BUN 61 (*)    Creatinine, Ser 2.21 (*)    Total Protein 8.4 (*)    Alkaline Phosphatase 234 (*)    Total Bilirubin 2.5 (*)    GFR, Estimated 24 (*)    All other components within normal limits  CBC WITH DIFFERENTIAL/PLATELET - Abnormal; Notable for the following components:   WBC 20.6 (*)    MCV 113.5 (*)    MCHC 29.5 (*)    Neutro Abs 17.4 (*)  Monocytes Absolute 1.2 (*)    Abs Immature Granulocytes 0.15 (*)    All other components within normal limits  PROTIME-INR - Abnormal; Notable for the following components:   Prothrombin Time 20.2 (*)    INR 1.7 (*)    All other components within normal limits  URINALYSIS, COMPLETE (UACMP) WITH MICROSCOPIC - Abnormal; Notable for the following components:   Color, Urine YELLOW (*)    APPearance HAZY (*)    Glucose, UA >=500 (*)    Ketones, ur 20 (*)    Protein, ur 30 (*)    Bacteria, UA RARE (*)    All other components within normal limits  URINE DRUG SCREEN, QUALITATIVE ([facility] ONLY) - Abnormal; Notable for the following components:   Cocaine Metabolite,Ur [place] POSITIVE (*)    All other components within normal limits  ACETAMINOPHEN LEVEL - Abnormal; Notable for the following components:   Acetaminophen (Tylenol), Serum <10 (*)    All other components within normal limits  SALICYLATE LEVEL - Abnormal; Notable for the following components:   Salicylate Lvl <0.3 (*)    All other components within normal limits  CBG  MONITORING, ED - Abnormal; Notable for the following components:   Glucose-Capillary >600 (*)    All other components within normal limits  CBG MONITORING, ED - Abnormal; Notable for the following components:   Glucose-Capillary >600 (*)    All other components within normal limits  TROPONIN I (HIGH SENSITIVITY) - Abnormal; Notable for the following components:   Troponin I (High Sensitivity) 18 (*)    All other components within normal limits  CULTURE, BLOOD (ROUTINE X 2)  CULTURE, BLOOD (ROUTINE X 2)  URINE CULTURE  SARS CORONAVIRUS 2 BY RT PCR  APTT  CK  LACTIC ACID, PLASMA  ETHANOL  OSMOLALITY  BETA-HYDROXYBUTYRIC ACID  BETA-HYDROXYBUTYRIC ACID  BETA-HYDROXYBUTYRIC ACID  BASIC METABOLIC PANEL  BASIC METABOLIC PANEL  BASIC METABOLIC PANEL  BASIC METABOLIC PANEL  BASIC METABOLIC PANEL  HIV ANTIBODY (ROUTINE TESTING W REFLEX)  CREATININE, SERUM  PROCALCITONIN  LACTIC ACID, PLASMA  CBC  TYPE AND SCREEN  TROPONIN I (HIGH SENSITIVITY)     I reviewed labs and they are notable for hyperkalemia 7.3  EKG  ED ECG REPORT I, Lucillie Garfinkel, the attending physician, personally viewed and interpreted this ECG.   Date: 06/01/2022  EKG Time: 0421  Rate: 47  Rhythm: sinus bradycardia  Axis: nl  Intervals:QRS widening  ST&T Change: no ischemic changes    RADIOLOGY I independently reviewed and interpreted CT of the head and see no obvious bleeding or midline shift.   PROCEDURES:  Critical Care performed: Yes, see critical care procedure note(s)  .Critical Care  Performed by: Lucillie Garfinkel, MD Authorized by: Lucillie Garfinkel, MD   Critical care provider statement:    Critical care time (minutes):  60   Critical care was necessary to treat or prevent imminent or life-threatening deterioration of the following conditions:  Circulatory failure, renal failure, shock, dehydration and metabolic crisis   Critical care was time spent personally by me on the following activities:   Development of treatment plan with patient or surrogate, discussions with consultants, evaluation of patient's response to treatment, examination of patient, ordering and review of laboratory studies, ordering and review of radiographic studies, ordering and performing treatments and interventions, pulse oximetry, re-evaluation of patient's condition and review of old charts .Central Line  Date/Time: 06/01/2022 7:28 AM  Performed by: Lucillie Garfinkel, MD Authorized by: Lucillie Garfinkel, MD   Consent:  Consent obtained:  Emergent situation Universal protocol:    Patient identity confirmed:  Provided demographic data Pre-procedure details:    Indication(s): central venous access     Hand hygiene: Hand hygiene performed prior to insertion     Sterile barrier technique: All elements of maximal sterile technique followed     Skin preparation:  Chlorhexidine   Skin preparation agent: Skin preparation agent completely dried prior to procedure   Sedation:    Sedation type:  None Anesthesia:    Anesthesia method:  Local infiltration   Local anesthetic:  Lidocaine 1% w/o epi Procedure details:    Location:  R femoral   Patient position:  Supine   Procedural supplies:  Triple lumen   Landmarks identified: yes     Ultrasound guidance: yes     Ultrasound guidance timing: real time     Sterile ultrasound techniques: Sterile gel and sterile probe covers were used     Number of attempts:  1   Successful placement: yes   Post-procedure details:    Post-procedure:  Dressing applied and line sutured   Assessment:  Blood return through all ports, placement verified by x-ray and free fluid flow   Procedure completion:  Tolerated well, no immediate complications    MEDICATIONS ORDERED IN ED: Medications  sodium chloride 0.9 % bolus 1,000 mL (1,000 mLs Intravenous Not Given 06/01/22 0501)  atropine 1 MG/10ML injection (  Not Given 06/01/22 0552)  insulin regular, human (MYXREDLIN) 100 units/ 100 mL  infusion (2.8 Units/hr Intravenous Rate/Dose Verify 06/01/22 0642)  lactated ringers infusion ( Intravenous New Bag/Given 06/01/22 0720)  dextrose 5 % in lactated ringers infusion (0 mLs Intravenous Hold 06/01/22 0727)  dextrose 50 % solution 0-50 mL (has no administration in time range)  norepinephrine (LEVOPHED) '4mg'$  in 269m (0.016 mg/mL) premix infusion (7.5 mcg/min Intravenous Rate/Dose Change 06/01/22 0659)  docusate sodium (COLACE) capsule 100 mg (has no administration in time range)  polyethylene glycol (MIRALAX / GLYCOLAX) packet 17 g (has no administration in time range)  heparin injection 5,000 Units (5,000 Units Subcutaneous Given 06/01/22 0730)  sodium chloride 0.9 % bolus 1,000 mL (0 mLs Intravenous Stopped 06/01/22 0551)  calcium gluconate inj 10% (1 g) URGENT USE ONLY! (1 g Intravenous Given 06/01/22 0430)  atropine 1 MG/10ML injection 1 mg (1 mg Intravenous Given 06/01/22 0435)  calcium gluconate inj 10% (1 g) URGENT USE ONLY! (1 g Intravenous Given 06/01/22 0445)  ceFEPIme (MAXIPIME) 2 g in sodium chloride 0.9 % 100 mL IVPB (0 g Intravenous Stopped 06/01/22 0551)  atropine 1 MG/10ML injection 1 mg (1 mg Intravenous Given 06/01/22 0446)  vancomycin (VANCOCIN) IVPB 1000 mg/200 mL premix (0 mg Intravenous Stopped 06/01/22 0700)  furosemide (LASIX) injection 40 mg (40 mg Intravenous Given 06/01/22 0506)  insulin aspart (novoLOG) injection 10 Units (10 Units Intravenous Given 06/01/22 0544)  lactated ringers bolus 1,000 mL (0 mLs Intravenous Stopped 06/01/22 0722)  lactated ringers bolus 2,000 mL (0 mLs Intravenous Stopped 06/01/22 0644)    Consultants:  I spoke with ICU consultant ERufina Falcoregarding care plan for this patient.   IMPRESSION / MDM / ASSESSMENT AND PLAN / ED COURSE  I reviewed the triage vital signs and the nursing notes.                              Differential diagnosis includes, but is not limited to, DKA, hyperglycemia, HHS, metabolic derangements or  AKI severe  dehydration, sepsis or infection, traumatic injury head bleed intrathoracic or intra-abdominal bleeding, overdose CCB   The patient is on the cardiac monitor to evaluate for evidence of arrhythmia and/or significant heart rate changes.  MDM: Broad differential diagnosis in this hypotensive patient found down with high blood glucose.  Sepsis work-up including IV crystalloid bolus, sepsis bolus, and traumatic work-up including CT of the head neck thorax abdomen and pelvis.  Check for DKA or electrolyte derangements with basic labs, urinalysis, chest x-ray.  Check CK.  Initial EKG shows atrial fibrillation vs sinus (unclear P waves but appears regular) bradycardia 47 with QRS widening and prolonged QT interval.  Labs are pending but given her history of diabetes and appears clinically dehydrated, likely kidney injury concern for hyperkalemia we will give calcium gluconate while labs pending.  We will address the blood pressure by pressure bagging fluids at this time. Check tox labs.    Hyperkalemia 7+ was addressed with calcium, insulin, Lasix. Glucose was 900+ and hypotensive with evidence of severe dehydration was ordered for 4 L of IV crystalloid. Hypothermic and put on the [facility]. When she was initially presented with bradycardia and unstable with low blood pressure she was given 2 of IV atropine with some improvement to her heart rate from 40s to 60. Blood pressure improved slightly with IV fluid hydration, was supplemented with IV Levophed infusion to maintain maps 65. He was given broad-spectrum antibiotics to cover for potential sepsis. CT scan head and chest abdomen pelvis ordered for potential traumatic injuries in this anticoagulated patient who was found down, fortunately negative for acute traumatic injuries. The patient's Mental status slowly improved with the above interventions, continuing to maintain her airway and thus she was not intubated in the emergency  department. She was admitted to the ICU, for HHS/DKA?, sepsis, shock   Patient's presentation is most consistent with acute presentation with potential threat to life or bodily function.       FINAL CLINICAL IMPRESSION(S) / ED DIAGNOSES   Final diagnoses:  Altered mental status, unspecified altered mental status type  Hyperglycemia  Shock ([place])  Hyperkalemia  AKI (acute kidney injury) ([place])  Hypothermia, initial encounter     Rx / DC Orders   ED Discharge Orders     None        Note:  This document was prepared using Dragon voice recognition software and may include unintentional dictation errors.    Lucillie Garfinkel, MD 06/01/22 [id number]

## 2022-06-01 NOTE — H&P (Signed)
NAME:  Michaela Johnson, MRN:  [id number], DOB:  Jul 01, 1961, LOS: 0 ADMISSION DATE:  06/01/2022, CONSULTATION DATE:06/01/22 REFERRING MD: Dr. Jacelyn Grip, CHIEF COMPLAINT: AMS   History of Present Illness:  This is a 61 yo female who presented to [facility] ER via EMS on 11/12 with unresponsiveness.  She was found by her neighbor on the floor unresponsive with last known well time 20 hrs before being found.  Per ER notes EMS reported the pts CBG was >500; pt bradycardic hr in the 40's; and hypotensive sbp 70's.   ED Course Upon arrival to the ER pt remained altered.  ER vital signs:  bp 69/37/hr 50/resp rate 15/O2 sats 98%/ temp 88.7 F.  Pt received 2 mg of atropine; 40 mg of iv lasix; 2 g of calcium gluconate; vancomycin; cefepime; and 4L IV fluid bolus.  Lab results ruled pt in for DKA insulin gtt.  Levophed gtt initiated due to continued hypotension.  PCCM team contacted for ICU admission.  Lab results: Na+ 121/K+ 7.3/chloride 87/CO2 <7/glucose 967/BUN 61/creatinine 2.21/alk phos 234/troponin 18/lactic acid 2.4/wbc 20.6./PT 20.2/INR 9.2/TWKMQKM level <10/salicylate level <6.3/OTRRN drug positive for cocaine/ UA negative for UTI but glucose >=500 and ketones present    DG Wrist Complete Right:  Un-healed distal radius and ulna fractures since June. Comminution and dorsal impaction not significantly changed. No new osseous abnormality identified. CT Cervical Spine/Head: . No evidence of significant acute traumatic injury to the skull, brain or cervical spine. Mild cerebral atrophy with extensive chronic microvascular ischemic changes in the cerebral white matter, as above. Mild multilevel degenerative disc disease and cervical spondylosis, as above. CT Chest/Abd/Pelvis: . No definitive findings to suggest significant acute traumatic injury to the chest, abdomen or pelvis on today's noncontrast CT examination which is limited by considerable patient respiratory motion. There is a small volume of ascites, which is  nonspecific. Severe diffuse low attenuation throughout the hepatic parenchyma, indicative of a background of hepatic steatosis. The appearance of the lungs is concerning for developing interstitial lung disease. Outpatient referral to Pulmonology for further clinical evaluation is recommended. Follow-up nonemergent high-resolution chest CT should also be considered in 6-12 months to assess for temporal changes in the appearance of the lung parenchyma. Aortic atherosclerosis, in addition to left main and 2 vessel coronary artery disease. Please note that although the presence of coronary artery calcium documents the presence of coronary artery disease, the severity of this disease and any potential stenosis cannot be assessed on this non-gated CT examination. Assessment for potential risk factor modification, dietary therapy or pharmacologic therapy may be warranted, if clinically indicated.  Pertinent  Medical History  ETOH Abuse Anxiety Closed trimalleolar fracture of right ankle s/p repair  Constipation  Depression  Type II Diabetes Mellitus GERD  Neuropathy HTN Tobacco Use Paroxysmal Atrial Fibrillation (xarelto) Panic Attacks Pneumonia  Vitamin D Deficiency  Reduction and internal fixation of displaced right intertrochanteric hip fracture 01/04/22 Closed reduction of displaced right distal radius fracture 01/02/22  Significant Hospital Events: Including procedures, antibiotic start and stop dates in addition to other pertinent events   11/12: PT admitted to ICU with acute metabolic toxic encephalopathy; hypotension secondary to hypovolemic and possible septic shock; and DKA requiring insulin gtt and levophed gtt   Micro Data:   Blood x2 11/12>>negative  Urine 11/12>> MRSA PCR 11/12>> COVID-19 11/12>> Respiratory (~20 pathogens) panel by PCR 11/12>>  Antimicrobials:   Cefepime 11/12 x1 dose Vancomycin 11/12 x1 dose  Zosyn 11/12>>  Interim History / Subjective:  Pt now  awake but agitated/confused able to follow commands.  Pt tachypneic respiratory rate mid 30's; bp stable on levophed gtt   Objective   Blood pressure (!) 111/58, pulse 87, temperature (!) 89.7 F (32.1 C), resp. rate (!) 29, height _0  (1.575 m), weight 49.9 kg, SpO2 100 %.       No intake or output data in the 24 hours ending 06/01/22 0715 Filed Weights   06/01/22 0420  Weight: 49.9 kg    Examination: General: Acute on chronically-ill appearing female, in mild respiratory distress  HENT: Supple, JVD present  Lungs: Faint rhonchi throughout, mildly tachypneic  Cardiovascular: NSR, rrr, no r/g, 2+ radial/1+ distal pulses, no edema  Abdomen: +BS x4, soft, non distended, non tender  Extremities: Normal tone and moves all extremities  Neuro: Confused, agitated, able to follow commands, PERRLA  GU: Indwelling foley catheter in place draining clear yellow urine   Resolved Hospital Problem list     Assessment & Plan:  Diabetic ketoacidosis  - Continue insulin gtt until anion gap closed and serum CO2 20 or higher  - BMP q4hrs and Beta-hydroxybutyric acid q8hrs while on insulin gtt  - CBG's per endotool - IV fluids per DKA protocol  - Diabetes coordinator consulted appreciate input   Acute respiratory failure secondary to severe metabolic acidosis and possible aspiration event  CT Chest 06/01/22: concerning for developing interstitial lung disease  Hx: Tobacco Dependence  - Supplemental O2 for dyspnea and/or hypoxia - Maintain O2 sats >92% - Prn bronchodilator therapy  - Correct metabolic derangements  - Smoking cessation counseling once mentation improves  - Will need outpatient pulmonary follow-up at discharge  Hypotension multifactorial: hypovolemic and possible septic shock Mildly elevated troponin likely demand ischemia in the setting of acute illness Hx: Paroxysmal atrial fibrillation   - Continuous telemetry monitoring - Trend troponin's until peaked  - IV fluids and  prn levophed gtt to maintain map >65 - Will check TSH with thyroid panel  - BNP pending     Acute renal failure with hyperkalemia secondary to ATN in the setting of hypovolemic and possible septic shock  Severe metabolic acidosis  Lactic acidosis  Pseudohyponatremia in the setting of DKA  - Trend BMP and lactic acid  - Replace electrolytes as indicated  - Monitor UOP - Avoid nephrotoxic medications  - Aggressive IV fluid resuscitation   Possible sepsis secondary to atypical viral respiratory infection  - Trend WBC and monitor fever curve  - Trend PCT  - Follow cultures - Continue abx therapy as outlined above pending culture results and sensitivities   GERD  - IV pepcid   Hypothermia  - Apply bear hugger to maintain normothermia   Acute toxic metabolic encephalopathy  Hx: ETOH abuse and Cocaine Abuse   CT Head 06/01/22: No evidence of acute infarction, hemorrhage, hydrocephalus, extra-axial collection or mass lesion/mass effect Hx: Depression and panic attacks - Correct metabolic derangements - Will start high dose thiamine - Folic acid and mvi  - CIWA protocol  - Once mentation improves will need polysubstance abuse cessation counseling   Best Practice (right click and "Reselect all SmartList Selections" daily)   Diet/type: NPO DVT prophylaxis: prophylactic heparin  GI prophylaxis: H2B Lines: Central line Foley:  Yes, and it is still needed Code Status:  full code Last date of multidisciplinary goals of care discussion [N/A]  Labs   CBC: Recent Labs  Lab 06/01/22 0424  WBC 20.6*  NEUTROABS 17.4*  HGB 13.4  HCT 45.5  MCV 113.5*  PLT 456    Basic Metabolic Panel: Recent Labs  Lab 06/01/22 0424  NA 121*  K 7.3*  CL 87*  CO2 <7*  GLUCOSE 967*  BUN 61*  CREATININE 2.21*  CALCIUM 9.4   GFR: Estimated Creatinine Clearance: 20.5 mL/min (A) (by C-G formula based on SCr of 2.21 mg/dL (H)). Recent Labs  Lab 06/01/22 0424  WBC 20.6*  LATICACIDVEN  2.4*    Liver Function Tests: Recent Labs  Lab 06/01/22 0424  AST 29  ALT 18  ALKPHOS 234*  BILITOT 2.5*  PROT 8.4*  ALBUMIN 4.0   No results for input(s): "LIPASE", "AMYLASE" in the last 168 hours. No results for input(s): "AMMONIA" in the last 168 hours.  ABG    Component Value Date/Time   PHART 7.46 (H) 02/08/2018 0341   PCO2ART 42 02/08/2018 0341   PO2ART 59 (L) 02/08/2018 0341   HCO3 7.5 (L) 04/18/2021 1710   ACIDBASEDEF 18.8 (H) 04/18/2021 1710   O2SAT 95.1 04/18/2021 1710     Coagulation Profile: Recent Labs  Lab 06/01/22 0424  INR 1.7*    Cardiac Enzymes: Recent Labs  Lab 06/01/22 0424  CKTOTAL 206    HbA1C: Hgb A1c MFr Bld  Date/Time Value Ref Range Status  01/02/2022 04:27 PM 8.4 (H) 4.8 - 5.6 % Final    Comment:    (NOTE) Pre diabetes:          5.7%-6.4%  Diabetes:              >6.4%  Glycemic control for   <7.0% adults with diabetes   04/20/2021 05:39 AM 7.9 (H) 4.8 - 5.6 % Final    Comment:    (NOTE)         Prediabetes: 5.7 - 6.4         Diabetes: >6.4         Glycemic control for adults with diabetes: <7.0     CBG: Recent Labs  Lab 06/01/22 0641  GLUCAP >600*    Review of Systems:   Unable to assess pt confused   Past Medical History:  She,  has a past medical history of Alcohol use, Anxiety, Arrhythmia, Arthritis, Chronic alcohol use (07/31/2020), Chronic anticoagulation (07/31/2020), 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   Surgical History:   Past Surgical History:  Procedure Laterality Date   ANKLE FUSION Right 07/30/2020   Procedure: ANKLE FUSION;  Surgeon: Altamese Leominster, MD;  Location: [place];   Service: Orthopedics;  Laterality: Right;   APPLICATION OF WOUND VAAPPLICATION OF WOUND VAC (Right Ankle)C (Right Ankle)  25/63/8937   APPLICATION OF WOUND VAC Right 07/30/2020   Procedure: APPLICATION OF WOUND VAC;  Surgeon: Altamese Covel, MD;  Location: [place];  Service: Orthopedics;  Laterality: Right;   HARDWARE REMOVAL Right 07/30/2020   Procedure: HARDWARE REMOVAL RIGHT ANKLE;  Surgeon: Altamese [place], MD;  Location: [place];  Service: Orthopedics;  Laterality: Right;   HARDWARE REMOVAL RIGHT ANKLE (Right Ankle  07/31/2020   INTRAMEDULLARY (IM) NAIL INTERTROCHANTERIC Right 01/04/2022   Procedure: INTRAMEDULLARY (IM) NAIL INTERTROCHANTRIC;  Surgeon: Corky Mull, MD;  Location: [facility];  Service: Orthopedics;  Laterality: Right;   ORIF ANKLE FRACTURE Right 07/04/2020   Procedure: OPEN REDUCTION INTERNAL FIXATION (ORIF) ANKLE FRACTURE;  Surgeon: Lovell Sheehan, MD;  Location:  [facility];  Service: Orthopedics;  Laterality: Right;     Social History:   reports that she has been smoking cigarettes, e-cigarettes, and cigars. She has a 41.00 pack-year smoking history. She has never used smokeless tobacco. She reports current alcohol use of about 6.0 standard drinks of alcohol per week. She reports current drug use. Frequency: 7.00 times per week. Drug: Marijuana.   Family History:  Her family history includes CAD in her father; Diabetes in her mother; Heart Problems in her father; Heart failure in her father.   Allergies Allergies  Allergen Reactions   Glipizide     Other reaction(s): Other (See Comments)   Metformin Diarrhea   Codeine Itching   Levaquin [Levofloxacin In D5w] Itching     Home Medications  Prior to Admission medications   Medication Sig Start Date End Date Taking? Authorizing Provider  acetaminophen (TYLENOL) 500 MG tablet Take 1-2 tablets (500-1,000 mg total) by mouth every 6 (six) hours as needed for moderate pain. Patient not taking: Reported on 01/16/2022 01/06/22    Lattie Corns, PA-C  albuterol (PROVENTIL HFA;VENTOLIN HFA) 108 (90 Base) MCG/ACT inhaler Inhale 2 puffs into the lungs every 6 (six) hours as needed for wheezing or shortness of breath. Patient not taking: Reported on 01/03/2022 02/14/18   Saundra Shelling, MD  amLODipine-benazepril (LOTREL) 5-40 MG capsule Take 1 capsule by mouth daily.    [provider]  Cholecalciferol (VITAMIN D) 125 MCG (5000 UT) CAPS Take 1 capsule by mouth daily. Patient not taking: Reported on 01/16/2022 08/03/20   Ainsley Spinner, PA-C  diltiazem [facility] CD) 180 MG 24 hr capsule Take 1 capsule by mouth once daily 05/12/22   Minna Merritts, MD  diphenhydrAMINE (BENADRYL) 12.5 MG/5ML liquid Take 25 mg by mouth 4 (four) times daily as needed for itching. Patient not taking: Reported on 01/16/2022    [provider]  docusate sodium (COLACE) 100 MG capsule Take 1 capsule (100 mg total) by mouth 2 (two) times daily. Patient not taking: Reported on 01/16/2022 01/07/22   Lorella Nimrod, MD  ferrous [id number] C-folic acid (TRINSICON / FOLTRIN) capsule Take 1 capsule by mouth 3 (three) times daily after meals. Patient not taking: Reported on 01/16/2022 01/07/22   Lorella Nimrod, MD  FLUoxetine (PROZAC) 40 MG capsule Take 40 mg by mouth daily. Patient not taking: Reported on 01/16/2022    [provider]  gabapentin (NEURONTIN) 300 MG capsule Take 600 mg by mouth at bedtime as needed (Neuropathy). Patient not taking: Reported on 01/16/2022 02/28/20   [provider]  insulin aspart (NOVOLOG) 100 UNIT/ML injection Inject 4 Units into the skin 3 (three) times daily with meals. Short-acting insulin Patient not taking: Reported on 01/16/2022 04/20/21 07/19/21  Enzo Bi, MD  JARDIANCE 10 MG TABS tablet Take 10 mg by mouth daily. Patient not taking: Reported on 01/16/2022 11/25/21   [provider]  LANTUS SOLOSTAR 100 UNIT/ML Solostar Pen Inject 37 Units into the skin daily. This is your  home dose of long-acting insulin. Patient not taking: Reported on 01/16/2022 04/20/21   Enzo Bi, MD  Multiple Vitamin (MULTIVITAMIN WITH MINERALS) TABS tablet Take 1 tablet by mouth daily. Patient not taking: Reported on 01/16/2022 01/07/22   Lorella Nimrod, MD  nicotine (NICODERM CQ - DOSED IN MG/24 HOURS) 21 mg/24hr patch Place 1 patch (21 mg total) onto the skin daily. Patient not taking: Reported on 01/16/2022 04/21/21   Enzo Bi, MD  omeprazole (PRILOSEC) 20 MG capsule Take 20 mg  by mouth daily. Patient not taking: Reported on 01/16/2022    [provider]  ondansetron (ZOFRAN) 4 MG tablet Take 1 tablet (4 mg total) by mouth every 6 (six) hours as needed for nausea. Patient not taking: Reported on 01/16/2022 01/06/22   Lattie Corns, PA-C  oxyCODONE (OXY IR/ROXICODONE) 5 MG immediate release tablet Take 1-2 tablets (5-10 mg total) by mouth every 4 (four) hours as needed for moderate pain (pain score 4-6). Patient not taking: Reported on 01/16/2022 01/06/22   Lattie Corns, PA-C  rivaroxaban (XARELTO) 20 MG TABS tablet Take 1 tablet (20 mg total) by mouth daily with supper. Patient not taking: Reported on 01/16/2022 03/09/19   Minna Merritts, MD     Critical care time: 60 minutes      Donell Beers, [place] Pager [phone] (please enter 7 digits) PCCM Consult Pager [phone] (please enter 7 digits)

## 2022-06-01 NOTE — Consult Note (Signed)
PHARMACY CONSULT NOTE - FOLLOW UP  Pharmacy Consult for Electrolyte Monitoring and Replacement   Recent Labs: Potassium (mmol/L)  Date Value  06/01/2022 4.1   Magnesium (mg/dL)  Date Value  06/01/2022 2.4   Calcium (mg/dL)  Date Value  06/01/2022 9.4   Albumin (g/dL)  Date Value  06/01/2022 4.0   Phosphorus (mg/dL)  Date Value  06/01/2022 2.3 (L)   Sodium (mmol/L)  Date Value  06/01/2022 137     Assessment: Michaela Johnson is a 61 y.o. female who presented to [facility] ER via EMS on 11/12 with unresponsiveness. She was found by her neighbor on the floor unresponsive with last known well time 20 hrs before being found. BG elevated on insulin infusion. Monitoring BMP q4H.   Infusions:   dextrose 5% lactated ringers 75 mL/hr at 06/01/22 1826   famotidine (PEPCID) IV Stopped (06/01/22 1215)   insulin 4.8 Units/hr (06/01/22 1826)   lactated ringers Stopped (06/01/22 1600)   norepinephrine (LEVOPHED) Adult infusion 2 mcg/min (06/01/22 1826)   piperacillin-tazobactam (ZOSYN)  IV Stopped (06/01/22 1752)   sodium chloride     sodium phosphate 15 mmol in dextrose 5 % 250 mL infusion 43 mL/hr at 06/01/22 1826   thiamine (VITAMIN B1) injection 500 mg (06/01/22 1024)   Goal of Therapy:  Electrolytes WNL  Plan:  No replacement needed at this time Follow up next BMP scheduled for 11/12 @ [place], PharmD PGY1 Pharmacy Resident 06/01/2022 6:45 PM

## 2022-06-01 NOTE — Progress Notes (Signed)
MD aleskerov notified of critical lab value CBG 716 by this RN. One time dose of 20 units insulin novolog placed by MD and administered by this RN.

## 2022-06-01 NOTE — Progress Notes (Signed)
PHARMACY -  BRIEF ANTIBIOTIC NOTE   Pharmacy has received consult(s) for Vancomycin from an ED provider.  The patient's profile has been reviewed for ht/wt/allergies/indication/available labs.    One time order(s) placed for Vancomycin 1 gm IV X 1.   Further antibiotics/pharmacy consults should be ordered by admitting physician if indicated.                       Thank you, Akeela Busk D 06/01/2022  4:57 AM

## 2022-06-02 DIAGNOSIS — E0811 Diabetes mellitus due to underlying condition with ketoacidosis with coma: Secondary | ICD-10-CM

## 2022-06-02 LAB — GLUCOSE, CAPILLARY
Glucose-Capillary: 115 mg/dL — ABNORMAL HIGH (ref 70–99)
Glucose-Capillary: 134 mg/dL — ABNORMAL HIGH (ref 70–99)
Glucose-Capillary: 135 mg/dL — ABNORMAL HIGH (ref 70–99)
Glucose-Capillary: 137 mg/dL — ABNORMAL HIGH (ref 70–99)
Glucose-Capillary: 154 mg/dL — ABNORMAL HIGH (ref 70–99)
Glucose-Capillary: 177 mg/dL — ABNORMAL HIGH (ref 70–99)
Glucose-Capillary: 186 mg/dL — ABNORMAL HIGH (ref 70–99)
Glucose-Capillary: 99 mg/dL (ref 70–99)

## 2022-06-02 LAB — CBC
HCT: 33.2 % — ABNORMAL LOW (ref 36.0–46.0)
Hemoglobin: 11.7 g/dL — ABNORMAL LOW (ref 12.0–15.0)
MCH: 33.3 pg (ref 26.0–34.0)
MCHC: 35.2 g/dL (ref 30.0–36.0)
MCV: 94.6 fL (ref 80.0–100.0)
Platelets: 248 10*3/uL (ref 150–400)
RBC: 3.51 MIL/uL — ABNORMAL LOW (ref 3.87–5.11)
RDW: 11.9 % (ref 11.5–15.5)
WBC: 13.7 10*3/uL — ABNORMAL HIGH (ref 4.0–10.5)
nRBC: 0 % (ref 0.0–0.2)

## 2022-06-02 LAB — SODIUM, URINE, RANDOM: Sodium, Ur: 38 mmol/L

## 2022-06-02 LAB — BASIC METABOLIC PANEL
Anion gap: 8 (ref 5–15)
BUN: 32 mg/dL — ABNORMAL HIGH (ref 8–23)
CO2: 21 mmol/L — ABNORMAL LOW (ref 22–32)
Calcium: 8.7 mg/dL — ABNORMAL LOW (ref 8.9–10.3)
Chloride: 113 mmol/L — ABNORMAL HIGH (ref 98–111)
Creatinine, Ser: 0.87 mg/dL (ref 0.44–1.00)
GFR, Estimated: >60 mL/min (ref >60)
Glucose, Bld: 316 mg/dL — ABNORMAL HIGH (ref 70–99)
Potassium: 3.6 mmol/L (ref 3.5–5.1)
Sodium: 142 mmol/L (ref 135–145)

## 2022-06-02 LAB — MAGNESIUM: Magnesium: 2.1 mg/dL (ref 1.7–2.4)

## 2022-06-02 LAB — HEMOGLOBIN A1C
Hgb A1c MFr Bld: 11.7 % — ABNORMAL HIGH (ref 4.8–5.6)
Mean Plasma Glucose: 289.09 mg/dL

## 2022-06-02 LAB — OSMOLALITY, URINE: Osmolality, Ur: 544 mOsm/kg (ref 300–900)

## 2022-06-02 LAB — PROCALCITONIN: Procalcitonin: 3.45 ng/mL

## 2022-06-02 LAB — PHOSPHORUS: Phosphorus: 3.3 mg/dL (ref 2.5–4.6)

## 2022-06-02 MED ORDER — INSULIN ASPART 100 UNIT/ML IJ SOLN
0.0000 [IU] | INTRAMUSCULAR | Status: DC
  Administered 2022-06-02 (×2): 1 [IU] via SUBCUTANEOUS
  Administered 2022-06-02: 2 [IU] via SUBCUTANEOUS
  Filled 2022-06-02 (×3): qty 1

## 2022-06-02 MED ORDER — ORAL CARE MOUTH RINSE: 15.0000 mL | OROMUCOSAL | Status: DC | PRN

## 2022-06-02 MED ORDER — LACTATED RINGERS IV SOLN: INTRAVENOUS | Status: DC

## 2022-06-02 MED ORDER — INSULIN GLARGINE-YFGN 100 UNIT/ML ~~LOC~~ SOLN
10.0000 [IU] | Freq: Two times a day (BID) | SUBCUTANEOUS | Status: DC
  Administered 2022-06-02 (×3): 10 [IU] via SUBCUTANEOUS
  Filled 2022-06-02 (×4): qty 0.1

## 2022-06-02 MED ORDER — INSULIN ASPART 100 UNIT/ML IJ SOLN
0.0000 [IU] | INTRAMUSCULAR | Status: DC
  Administered 2022-06-02: 2 [IU] via SUBCUTANEOUS
  Filled 2022-06-02 (×2): qty 1

## 2022-06-02 NOTE — Inpatient Diabetes Management (Addendum)
Inpatient Diabetes Program Recommendations  AACE/ADA: New Consensus Statement on Inpatient Glycemic Control (2015)  Target Ranges:  Prepandial:   less than 140 mg/dL      Peak postprandial:   less than 180 mg/dL (1-2 hours)      Critically ill patients:  140 - 180 mg/dL    Latest Reference Range & Units 06/01/22 04:24  Sodium 135 - 145 mmol/L 121 (L)  Potassium 3.5 - 5.1 mmol/L 7.3 (HH)  Chloride 98 - 111 mmol/L 87 (L)  CO2 22 - 32 mmol/L <7 (L)  Glucose 70 - 99 mg/dL 967 (HH)  BUN 8 - 23 mg/dL 61 (H)  Creatinine 0.44 - 1.00 mg/dL 2.21 (H)  Calcium 8.9 - 10.3 mg/dL 9.4  Anion gap 5 - 15  NOT CALCULATED    Latest Reference Range & Units 06/01/22 04:24  Cocaine Metabolite,Ur [place] NONE DETECTED  POSITIVE !  !: Data is abnormal  Latest Reference Range & Units 06/01/22 06:47 06/01/22 10:28 06/01/22 18:03  Beta-Hydroxybutyric Acid 0.05 - 0.27 mmol/L >8.00 (H) >8.00 (H) 1.83 (H)  (H): Data is abnormally high  Latest Reference Range & Units 06/01/22 18:07 06/01/22 19:20 06/01/22 20:21 06/01/22 21:22 06/01/22 22:48 06/01/22 23:59 06/02/22 02:06 06/02/22 03:10  Glucose-Capillary 70 - 99 mg/dL 324 (H)  IV Insulin Drip Infusing 321 (H) 181 (H) 157 (H) 167 (H) 154 (H) 134 (H) 115 (H)  10 units Semglee '@0311'$   IV Insulin Drip stopped 0541 per MAR  (H): Data is abnormally high   Admit with:  Acute metabolic toxic encephalopathy; hypotension secondary to hypovolemic and possible septic shock; and DKA  Acute respiratory failure secondary to severe metabolic acidosis and possible aspiration event  Tox Screen + for Cocaine  History: DM, Polysubstance Abuse  Home DM Meds: Novolog 4 units TID (NOT taking)       Lantus 37 units Daily (NOT taking)       Jardiance 10 mg Daily (NOT taking)  Current Orders: Semglee 10 units BID      Novolog Sensitive Correction Scale/ SSI (0-9 units) Q4 hours     Note transition to SQ insulin early this AM  2nd dose Semglee due this AM (Semglee ordered BID  dosing)     MD- Please consider adding Hemoglobin A1c to current Labs Last A1c on file was 8.4% (June 2023)  [facility] consulted as pt does not have insurance and has NOT been taking insulin   Addendum 11:30am--Attempted to come by and speak with pt.  Per RN, pt only really oriented to self--Still lethargic and not appropriate for in-depth conversation today.  Will follow up with pt Tuesday 11/14.    --Will follow patient during hospitalization--  Wyn Quaker RN, MSN, [place] Diabetes Coordinator Inpatient Glycemic Control Team Team Pager: [phone] (8a-5p)

## 2022-06-02 NOTE — Progress Notes (Signed)
CHIEF COMPLAINT:   Chief Complaint  Patient presents with   Altered Mental Status     Significant Hospital Events: Including procedures, antibiotic start and stop dates in addition to other pertinent events   11/12: PT admitted to ICU with acute metabolic toxic encephalopathy; hypotension secondary to hypovolemic and possible septic shock; and DKA requiring insulin gtt and levophed gtt    Micro Data:   Blood x2 11/12>>negative  Urine 11/12>> MRSA PCR 11/12>> COVID-19 11/12>> Respiratory (~20 pathogens) panel by PCR 11/12>>   Antimicrobials:   Cefepime 11/12 x1 dose Vancomycin 11/12 x1 dose  Zosyn 11/12>>   Interim History / Subjective:  Off pressors Resp status stable Does not open eye but responds to vocal stimuli +cocaine   Examination:  General exam: Appears comfortable  Respiratory system: Clear to auscultation. Respiratory effort normal. HEENT: [place]/AT, PERRLA, no thrush, no stridor. Cardiovascular system: S1 & S2 heard, RRR.Marland Kitchen Gastrointestinal system: Abdomen is nondistended, soft and nontender.  Central nervous system: lethargic but arousabale no focal deficits Extremities: Symmetric 5 x 5 power. Skin: No rashes, lesions or ulcers Psychiatry: Judgement and insight very poor   VITALS:  height is '5\' 2"'$  (1.575 m) and weight is 50.9 kg. Her temperature is 99 F (37.2 C). Her blood pressure is 131/65 and her pulse is 112 (abnormal). Her respiration is 20 and oxygen saturation is 100%.   I personally reviewed Labs under Results section.  Radiology Reports CT CHEST ABDOMEN PELVIS WO CONTRAST  Result Date: 06/01/2022 CLINICAL DATA:  61 year old female with history of blunt trauma. EXAM: CT CHEST, ABDOMEN AND PELVIS WITHOUT CONTRAST TECHNIQUE: Multidetector CT imaging of the chest, abdomen and pelvis was performed following the standard protocol without IV contrast. RADIATION DOSE REDUCTION: This exam was performed according to the departmental dose-optimization  program which includes automated exposure control, adjustment of the mA and/or kV according to patient size and/or use of iterative reconstruction technique. COMPARISON:  CT of the chest, abdomen and pelvis 04/18/2021. FINDINGS: Comment: Portions of today's examination are severely limited by considerable patient respiratory motion. CT CHEST FINDINGS Cardiovascular: Heart size is normal. There is no significant pericardial fluid, thickening or pericardial calcification. There is aortic atherosclerosis, as well as atherosclerosis of the great vessels of the mediastinum and the coronary arteries, including calcified atherosclerotic plaque in the left main, left anterior descending and right coronary arteries. Mediastinum/Nodes: No pathologically enlarged mediastinal or hilar lymph nodes. Please note that accurate exclusion of hilar adenopathy is limited on noncontrast CT scans. Esophagus is unremarkable in appearance. No axillary lymphadenopathy. Lungs/Pleura: Evaluation of the lungs is limited by considerable patient respiratory motion. With these limitations in mind, there is widespread areas of ground-glass attenuation, septal thickening and some subpleural reticulation, most evident in the mid to lower lungs bilaterally, new compared to the prior study. No confluent consolidative airspace disease. No pleural effusions. No definite suspicious appearing pulmonary nodules or masses are noted. No pneumothorax. Musculoskeletal: Old fracture of the lateral aspect of the left clavicle with chronic posttraumatic deformity and probable fibrous union, similar to the remote prior examination. No definite acute displaced fractures or aggressive appearing lytic or blastic lesions are noted in the visualized portions of the skeleton on today's examination which is limited by considerable patient respiratory motion. CT ABDOMEN PELVIS FINDINGS Hepatobiliary: Diffuse low attenuation throughout the hepatic parenchyma, indicative of  a background of hepatic steatosis. No discrete cystic or solid hepatic lesions are confidently identified on today's noncontrast CT examination. Gallbladder is nearly completely decompressed, but  otherwise unremarkable in appearance. Pancreas: No definite pancreatic mass or peripancreatic fluid collections or inflammatory changes confidently identified on today's noncontrast CT examination. Spleen: Unremarkable. Adrenals/Urinary Tract: Unenhanced appearance of the kidneys and bilateral adrenal glands is unremarkable. No hydroureteronephrosis. Urinary bladder is completely decompressed around an indwelling Foley balloon catheter. Small amount of gas non dependently in the urinary bladder is presumably iatrogenic. Stomach/Bowel: Unenhanced appearance of the stomach is unremarkable. No pathologic dilatation of small bowel or colon. The appendix is not confidently identified and may be surgically absent. Regardless, there are no inflammatory changes noted adjacent to the cecum to suggest the presence of an acute appendicitis at this time. Vascular/Lymphatic: Atherosclerotic calcifications throughout the abdominal aorta and pelvic vasculature. Right femoral central venous catheter with tip terminating in the right common iliac vein. Reproductive: Uterus and ovaries are atrophic. Other: Small volume of ascites.  No pneumoperitoneum. Musculoskeletal: Postoperative changes of ORIF are noted in the right proximal femur. Well-defined sclerotic lesion with narrow zone of transition in the right ilium (axial image 99 of series 5) measuring 1.5 x 1.1 cm, similar to the prior study, presumably a large bone island. There are no aggressive appearing lytic or blastic lesions noted in the visualized portions of the skeleton. IMPRESSION: 1. No definitive findings to suggest significant acute traumatic injury to the chest, abdomen or pelvis on today's noncontrast CT examination which is limited by considerable patient respiratory  motion. 2. There is a small volume of ascites, which is nonspecific. 3. Severe diffuse low attenuation throughout the hepatic parenchyma, indicative of a background of hepatic steatosis. 4. The appearance of the lungs is concerning for developing interstitial lung disease. Outpatient referral to Pulmonology for further clinical evaluation is recommended. Follow-up nonemergent high-resolution chest CT should also be considered in 6-12 months to assess for temporal changes in the appearance of the lung parenchyma. 5. Aortic atherosclerosis, in addition to left main and 2 vessel coronary artery disease. Please note that although the presence of coronary artery calcium documents the presence of coronary artery disease, the severity of this disease and any potential stenosis cannot be assessed on this non-gated CT examination. Assessment for potential risk factor modification, dietary therapy or pharmacologic therapy may be warranted, if clinically indicated. 6. Additional incidental findings, as above. Electronically Signed   By: Vinnie Langton M.D.   On: 06/01/2022 06:53   CT Head Wo Contrast  Result Date: 06/01/2022 CLINICAL DATA:  61 year old female with history of blunt trauma. EXAM: CT HEAD WITHOUT CONTRAST CT CERVICAL SPINE WITHOUT CONTRAST TECHNIQUE: Multidetector CT imaging of the head and cervical spine was performed following the standard protocol without intravenous contrast. Multiplanar CT image reconstructions of the cervical spine were also generated. RADIATION DOSE REDUCTION: This exam was performed according to the departmental dose-optimization program which includes automated exposure control, adjustment of the mA and/or kV according to patient size and/or use of iterative reconstruction technique. COMPARISON:  No priors. FINDINGS: CT HEAD FINDINGS Brain: Mild cerebral atrophy. Patchy and confluent areas of decreased attenuation are noted throughout the deep and periventricular white matter of  the cerebral hemispheres bilaterally, compatible with chronic microvascular ischemic disease. No evidence of acute infarction, hemorrhage, hydrocephalus, extra-axial collection or mass lesion/mass effect. Vascular: No hyperdense vessel or unexpected calcification. Skull: Normal. Negative for fracture or focal lesion. Sinuses/Orbits: No acute finding. Other: None. CT CERVICAL SPINE FINDINGS Alignment: Normal. Skull base and vertebrae: No acute fracture. No primary bone lesion or focal pathologic process. Soft tissues and spinal canal: No prevertebral fluid  or swelling. No visible canal hematoma. Disc levels: Multilevel degenerative disc disease, most pronounced at C5-C6 and C6-C7. Moderate multilevel facet arthropathy bilaterally. Upper chest: Please see separate dictation for contemporaneously obtained CT chest, abdomen and pelvis. Other: None. IMPRESSION: 1. No evidence of significant acute traumatic injury to the skull, brain or cervical spine. 2. Mild cerebral atrophy with extensive chronic microvascular ischemic changes in the cerebral white matter, as above. 3. Mild multilevel degenerative disc disease and cervical spondylosis, as above. Electronically Signed   By: Vinnie Langton M.D.   On: 06/01/2022 06:45   CT Cervical Spine Wo Contrast  Result Date: 06/01/2022 CLINICAL DATA:  61 year old female with history of blunt trauma. EXAM: CT HEAD WITHOUT CONTRAST CT CERVICAL SPINE WITHOUT CONTRAST TECHNIQUE: Multidetector CT imaging of the head and cervical spine was performed following the standard protocol without intravenous contrast. Multiplanar CT image reconstructions of the cervical spine were also generated. RADIATION DOSE REDUCTION: This exam was performed according to the departmental dose-optimization program which includes automated exposure control, adjustment of the mA and/or kV according to patient size and/or use of iterative reconstruction technique. COMPARISON:  No priors. FINDINGS: CT HEAD  FINDINGS Brain: Mild cerebral atrophy. Patchy and confluent areas of decreased attenuation are noted throughout the deep and periventricular white matter of the cerebral hemispheres bilaterally, compatible with chronic microvascular ischemic disease. No evidence of acute infarction, hemorrhage, hydrocephalus, extra-axial collection or mass lesion/mass effect. Vascular: No hyperdense vessel or unexpected calcification. Skull: Normal. Negative for fracture or focal lesion. Sinuses/Orbits: No acute finding. Other: None. CT CERVICAL SPINE FINDINGS Alignment: Normal. Skull base and vertebrae: No acute fracture. No primary bone lesion or focal pathologic process. Soft tissues and spinal canal: No prevertebral fluid or swelling. No visible canal hematoma. Disc levels: Multilevel degenerative disc disease, most pronounced at C5-C6 and C6-C7. Moderate multilevel facet arthropathy bilaterally. Upper chest: Please see separate dictation for contemporaneously obtained CT chest, abdomen and pelvis. Other: None. IMPRESSION: 1. No evidence of significant acute traumatic injury to the skull, brain or cervical spine. 2. Mild cerebral atrophy with extensive chronic microvascular ischemic changes in the cerebral white matter, as above. 3. Mild multilevel degenerative disc disease and cervical spondylosis, as above. Electronically Signed   By: Vinnie Langton M.D.   On: 06/01/2022 06:45   DG Wrist Complete Right  Result Date: 06/01/2022 CLINICAL DATA:  61 year old female found down by neighbor. Last known well 20 hours ago. Hyperglycemia. EXAM: RIGHT WRIST - COMPLETE 3+ VIEW COMPARISON:  Right wrist series 06/15 - 01/07/2022. FINDINGS: Comminuted distal radius and ulna fractures in June. Distal radius and ulna deformities today appear to be the un healed injury from June. Mild displacement and dorsal angulation. Carpal bone alignment maintained. Metacarpals appear stable and intact. Incidental thenar area intravenous access.  IMPRESSION: 1. Un-healed distal radius and ulna fractures since June. Comminution and dorsal impaction not significantly changed. 2. No new osseous abnormality identified. Electronically Signed   By: Genevie Ann M.D.   On: 06/01/2022 06:27   DG Abd Portable 1 View  Result Date: 06/01/2022 CLINICAL DATA:  61 year old female found down by neighbor. Last known well 20 hours ago. Hyperglycemia. EXAM: PORTABLE ABDOMEN - 1 VIEW COMPARISON:  CT Abdomen and Pelvis 04/18/2021. Abdominal and pelvis radiographs 06/07/2016. Intraoperative right hip images [id number]. FINDINGS: Partially visible right femur ORIF hardware, visualized portion stable. Femoral heads normally located. Pelvis appears stable and intact. Grossly intact proximal left femur. Midline pelvic catheter in place, likely a Foley. Right femoral vascular catheter  in place, tip projects over the sacral ala. Negative visible bowel gas pattern. Visible lumbar spine appears stable. IMPRESSION: 1. Right femoral vascular catheter and midline probable Foley catheter in place. 2.  No acute fracture or dislocation identified about the pelvis. 3.  Non obstructed bowel gas pattern. Electronically Signed   By: Genevie Ann M.D.   On: 06/01/2022 06:25   DG Chest Port 1 View  Result Date: 06/01/2022 CLINICAL DATA:  61 year old female found down by neighbor. Last known well 20 hours ago. Hyperglycemia. EXAM: PORTABLE CHEST 1 VIEW COMPARISON:  Portable chest 01/02/2022 and earlier. FINDINGS: Portable AP supine view at 0553 hours. Mildly lower lung volumes. Mediastinal contours remain within normal limits. Pacer or resuscitation pads project over the chest and abdomen. Mild diffuse increased pulmonary interstitial opacity. No pneumothorax, pleural effusion or consolidation identified on this supine view. Chronic left clavicle fracture is stable. No acute osseous abnormality identified. Negative visible bowel gas. IMPRESSION: 1. Nonspecific increased pulmonary interstitial opacity.  Consider mild or developing pulmonary edema, atelectasis, viral/atypical respiratory infection. 2. Chronic left clavicle fracture. No acute traumatic injury identified. Electronically Signed   By: Genevie Ann M.D.   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  [facility] Pulmonary & Critical Care Medicine  Medical Director [place] Director [place] Department

## 2022-06-02 NOTE — Consult Note (Addendum)
Michaela Johnson for Electrolyte Monitoring and Replacement   Recent Labs: Potassium (mmol/L)  Date Value  06/02/2022 3.6   Magnesium (mg/dL)  Date Value  06/02/2022 2.1   Calcium (mg/dL)  Date Value  06/02/2022 8.7 (L)   Albumin (g/dL)  Date Value  06/01/2022 4.0   Phosphorus (mg/dL)  Date Value  06/02/2022 3.3   Sodium (mmol/L)  Date Value  06/02/2022 142     Assessment: Michaela Johnson is a 61 y.o. female who presented to [facility] ER via EMS on 11/12 with unresponsiveness. She was found by her neighbor on the floor unresponsive with last known well time 20 hrs before being found.  Infusions:   famotidine (PEPCID) IV Stopped (06/01/22 1215)   lactated ringers 75 mL/hr at 06/02/22 0600   norepinephrine (LEVOPHED) Adult infusion Stopped (06/01/22 2208)   piperacillin-tazobactam (ZOSYN)  IV 12.5 mL/hr at 06/02/22 0600   sodium chloride     thiamine (VITAMIN B1) injection 500 mg (06/01/22 1024)   Goal of Therapy:  Electrolytes WNL  Plan:  No replacement needed at this time Follow up next BMP scheduled 11/14 am  Vallery Sa, PharmD, BCPS 06/02/2022 7:02 AM

## 2022-06-03 ENCOUNTER — Inpatient Hospital Stay: Payer: Self-pay

## 2022-06-03 DIAGNOSIS — Z8719 Personal history of other diseases of the digestive system: Secondary | ICD-10-CM

## 2022-06-03 DIAGNOSIS — F1490 Cocaine use, unspecified, uncomplicated: Secondary | ICD-10-CM | POA: Insufficient documentation

## 2022-06-03 DIAGNOSIS — E1110 Type 2 diabetes mellitus with ketoacidosis without coma: Secondary | ICD-10-CM

## 2022-06-03 DIAGNOSIS — K7460 Unspecified cirrhosis of liver: Secondary | ICD-10-CM | POA: Insufficient documentation

## 2022-06-03 LAB — CBC
HCT: 34.7 % — ABNORMAL LOW (ref 36.0–46.0)
Hemoglobin: 11.9 g/dL — ABNORMAL LOW (ref 12.0–15.0)
MCH: 33.1 pg (ref 26.0–34.0)
MCHC: 34.3 g/dL (ref 30.0–36.0)
MCV: 96.7 fL (ref 80.0–100.0)
Platelets: 214 10*3/uL (ref 150–400)
RBC: 3.59 MIL/uL — ABNORMAL LOW (ref 3.87–5.11)
RDW: 12.6 % (ref 11.5–15.5)
WBC: 10.4 10*3/uL (ref 4.0–10.5)
nRBC: 0 % (ref 0.0–0.2)

## 2022-06-03 LAB — BASIC METABOLIC PANEL
Anion gap: 10 (ref 5–15)
BUN: 18 mg/dL (ref 8–23)
CO2: 25 mmol/L (ref 22–32)
Calcium: 9.3 mg/dL (ref 8.9–10.3)
Chloride: 110 mmol/L (ref 98–111)
Creatinine, Ser: 0.54 mg/dL (ref 0.44–1.00)
GFR, Estimated: >60 mL/min (ref >60)
Glucose, Bld: 100 mg/dL — ABNORMAL HIGH (ref 70–99)
Potassium: 3.1 mmol/L — ABNORMAL LOW (ref 3.5–5.1)
Sodium: 145 mmol/L (ref 135–145)

## 2022-06-03 LAB — GLUCOSE, CAPILLARY
Glucose-Capillary: 105 mg/dL — ABNORMAL HIGH (ref 70–99)
Glucose-Capillary: 67 mg/dL — ABNORMAL LOW (ref 70–99)
Glucose-Capillary: 140 mg/dL — ABNORMAL HIGH (ref 70–99)
Glucose-Capillary: 297 mg/dL — ABNORMAL HIGH (ref 70–99)
Glucose-Capillary: 238 mg/dL — ABNORMAL HIGH (ref 70–99)
Glucose-Capillary: 214 mg/dL — ABNORMAL HIGH (ref 70–99)

## 2022-06-03 LAB — MAGNESIUM: Magnesium: 2.2 mg/dL (ref 1.7–2.4)

## 2022-06-03 LAB — URINE CULTURE: Culture: >=100000 — AB

## 2022-06-03 LAB — THYROID PANEL WITH TSH
Free Thyroxine Index: 1.2 (ref 1.2–4.9)
T3 Uptake Ratio: 36 % (ref 24–39)
T4, Total: 3.2 ug/dL — ABNORMAL LOW (ref 4.5–12.0)
TSH: 2.14 u[IU]/mL (ref 0.450–4.500)

## 2022-06-03 LAB — PHOSPHORUS: Phosphorus: 1.7 mg/dL — ABNORMAL LOW (ref 2.5–4.6)

## 2022-06-03 LAB — PROCALCITONIN: Procalcitonin: 0.82 ng/mL

## 2022-06-03 MED ORDER — INSULIN GLARGINE-YFGN 100 UNIT/ML ~~LOC~~ SOLN
8.0000 [IU] | Freq: Two times a day (BID) | SUBCUTANEOUS | Status: DC
  Administered 2022-06-03 – 2022-06-04 (×3): 8 [IU] via SUBCUTANEOUS
  Filled 2022-06-03 (×4): qty 0.08

## 2022-06-03 MED ORDER — INSULIN ASPART 100 UNIT/ML IJ SOLN
0.0000 [IU] | Freq: Three times a day (TID) | INTRAMUSCULAR | Status: DC
  Administered 2022-06-03: 5 [IU] via SUBCUTANEOUS
  Filled 2022-06-03: qty 1

## 2022-06-03 MED ORDER — ACETAMINOPHEN 500 MG PO TABS: 1000.0000 mg | ORAL_TABLET | Freq: Four times a day (QID) | ORAL | Status: DC | PRN

## 2022-06-03 MED ORDER — THIAMINE MONONITRATE 100 MG PO TABS
100.0000 mg | ORAL_TABLET | Freq: Every day | ORAL | Status: DC
  Administered 2022-06-03 – 2022-06-04 (×2): 100 mg via ORAL
  Filled 2022-06-03 (×2): qty 1

## 2022-06-03 MED ORDER — INSULIN ASPART 100 UNIT/ML IJ SOLN
0.0000 [IU] | Freq: Three times a day (TID) | INTRAMUSCULAR | Status: DC
  Administered 2022-06-03: 5 [IU] via SUBCUTANEOUS
  Administered 2022-06-04: 15 [IU] via SUBCUTANEOUS
  Administered 2022-06-04: 5 [IU] via SUBCUTANEOUS
  Filled 2022-06-03 (×3): qty 1

## 2022-06-03 MED ORDER — POTASSIUM PHOSPHATES 15 MMOLE/5ML IV SOLN
15.0000 mmol | Freq: Once | INTRAVENOUS | Status: AC
  Administered 2022-06-03: 15 mmol via INTRAVENOUS
  Filled 2022-06-03: qty 5

## 2022-06-03 MED ORDER — AMOXICILLIN-POT CLAVULANATE 875-125 MG PO TABS
1.0000 | ORAL_TABLET | Freq: Two times a day (BID) | ORAL | Status: DC
  Administered 2022-06-03 – 2022-06-04 (×2): 1 via ORAL
  Filled 2022-06-03 (×2): qty 1

## 2022-06-03 MED ORDER — DILTIAZEM HCL ER COATED BEADS 180 MG PO CP24
180.0000 mg | ORAL_CAPSULE | Freq: Every day | ORAL | Status: DC
  Administered 2022-06-03 – 2022-06-04 (×2): 180 mg via ORAL
  Filled 2022-06-03 (×2): qty 1

## 2022-06-03 MED ORDER — RIVAROXABAN 20 MG PO TABS
20.0000 mg | ORAL_TABLET | Freq: Every day | ORAL | Status: DC
  Administered 2022-06-03: 20 mg via ORAL
  Filled 2022-06-03 (×2): qty 1

## 2022-06-03 MED ORDER — ACETAMINOPHEN 500 MG PO TABS
500.0000 mg | ORAL_TABLET | Freq: Four times a day (QID) | ORAL | Status: DC | PRN
  Administered 2022-06-03 – 2022-06-04 (×3): 500 mg via ORAL
  Filled 2022-06-03 (×3): qty 1

## 2022-06-03 MED ORDER — GABAPENTIN 300 MG PO CAPS
900.0000 mg | ORAL_CAPSULE | Freq: Every day | ORAL | Status: DC
  Administered 2022-06-03: 900 mg via ORAL
  Filled 2022-06-03: qty 3

## 2022-06-03 MED ORDER — INSULIN ASPART 100 UNIT/ML IJ SOLN
0.0000 [IU] | Freq: Every day | INTRAMUSCULAR | Status: DC
  Administered 2022-06-03: 2 [IU] via SUBCUTANEOUS
  Filled 2022-06-03: qty 1

## 2022-06-03 MED ORDER — FOLIC ACID 1 MG PO TABS
1.0000 mg | ORAL_TABLET | Freq: Every day | ORAL | Status: DC
  Administered 2022-06-03 – 2022-06-04 (×2): 1 mg via ORAL
  Filled 2022-06-03 (×2): qty 1

## 2022-06-03 MED ORDER — PANTOPRAZOLE SODIUM 40 MG PO TBEC
40.0000 mg | DELAYED_RELEASE_TABLET | Freq: Every day | ORAL | Status: DC
  Administered 2022-06-03 – 2022-06-04 (×2): 40 mg via ORAL
  Filled 2022-06-03 (×2): qty 1

## 2022-06-03 NOTE — Inpatient Diabetes Management (Addendum)
Inpatient Diabetes Program Recommendations  AACE/ADA: New Consensus Statement on Inpatient Glycemic Control (2015)  Target Ranges:  Prepandial:   less than 140 mg/dL      Peak postprandial:   less than 180 mg/dL (1-2 hours)      Critically ill patients:  140 - 180 mg/dL    Latest Reference Range & Units 06/02/22 07:49 06/02/22 12:10 06/02/22 16:17 06/02/22 19:16 06/02/22 23:19 06/03/22 03:38 06/03/22 04:09  Glucose-Capillary 70 - 99 mg/dL  10 units Semglee _0  177 (H)  2 units Novolog  186 (H)  2 units Novolog  10 units Semglee _1  137 (H)  1 unit Novolog 99 135 (H)  1 unit Novolog  10 units Semglee _2   67 (L) 105 (H)  (H): Data is abnormally high (L): Data is abnormally low  Admit with:  Acute metabolic toxic encephalopathy; hypotension secondary to hypovolemic and possible septic shock; and DKA  Acute respiratory failure secondary to severe metabolic acidosis and possible aspiration event  Tox Screen + for Cocaine   History: DM, Polysubstance Abuse   Home DM Meds: Novolog 4 units TID (NOT taking)                             Lantus 37 units Daily (NOT taking)                             Jardiance 10 mg Daily (NOT taking)   Current Orders: Semglee 10 units BID                            Novolog Sensitive Correction Scale/ SSI (0-9 units) Q4 hours    TOC consulted as pt does not have insurance and has NOT been taking insulin     MD- Note Hypoglycemia at 4am today.  Please consider reducing the Semglee to 8 units BID    Addendum 10:30am--Met w/ pt at bedside in the ICU.  Pt still very sleepy and kept falling back asleep, however,  she was able to wake for a few minutes and answer a few of my questions.  [facility] to the [facility].  Gets Insulins through the clinic pharmacy for $10 a piece (pt stated they were $10).  Has traditional fingerstick CBG meter at home--States she checks her CBGs BID at home but I am not 449% certain this  is accurate info.  Pt told me she has her insulins at home (pt verified she is supposed to be taking Lantus 37 units daily + Novolog 4 units TID with meals + Jardiance 10 mg Daily).  Stated to me she missed her meds for only a few days at home prior to admission, however, I am uncertain this is true either given her A1c rose from 11.7% (currently) and last A1c was 8.4% back in June 2023.  Of note, Cocaine Screen was positive--This will definitely have an impact on her self-care at home.  Reviewed current A1c with pt and reminded pt her goal A1c is 7% or less--Also reviewed goal CBGs for home and encouraged pt to check CBGs at least TID prior to meals at home.  Also discussed with patient diagnosis of DKA (pathophysiology), treatment of DKA, lab results, and transition plan to SQ insulin regimen.     --Will follow patient during hospitalization--  Wyn Quaker RN, MSN, CDCES Diabetes  Coordinator Inpatient Glycemic Control Team Team Pager: [phone] (8a-5p)

## 2022-06-03 NOTE — Consult Note (Signed)
[place] for Electrolyte Monitoring and Replacement   Recent Labs: Potassium (mmol/L)  Date Value  06/03/2022 3.1 (L)   Magnesium (mg/dL)  Date Value  06/03/2022 2.2   Calcium (mg/dL)  Date Value  06/03/2022 9.3   Albumin (g/dL)  Date Value  06/01/2022 4.0   Phosphorus (mg/dL)  Date Value  06/03/2022 1.7 (L)   Sodium (mmol/L)  Date Value  06/03/2022 145    Assessment: Michaela Johnson is a 61 y.o. female who presented to [facility] ER via EMS on 11/12 with unresponsiveness. She was found by her neighbor on the floor unresponsive with last known well time 20 hrs before being found. Pharmacy is asked to follow and replace electrolytes while in CCU  Infusions:   famotidine (PEPCID) IV Stopped (06/02/22 0831)   sodium chloride     thiamine (VITAMIN B1) injection Stopped (06/02/22 1226)   Goal of Therapy:  Electrolytes WNL  Plan:  15 mmol IV potassium phosphate x 1 (contains 22 mEq IV potassium) Follow up next BMP scheduled 11/15 am  Vallery Sa, PharmD, BCPS 06/03/2022 7:05 AM

## 2022-06-03 NOTE — Progress Notes (Signed)
Patient arrived to unit from ICU via bed in stable condition.

## 2022-06-03 NOTE — Progress Notes (Addendum)
PROGRESS NOTE    Michaela Johnson  GMW:[id number] DOB: [birth year] DOA: 06/01/2022 PCP: [name], NP  Outpatient Specialists: cardiology    Brief Narrative:   Admitted to the ICU on 11/12 after presenting via EMS after being found down and unresponsive by a neighbor. On EMS arrival glucose >500, bradycardic, and hypotensive. On arrival found to be in DKA with hyperkalemia. Cocaine positive. Encephalopathic. Treated with IV insulin and pressor support (levopheg gtt). Also empiric abx (vanc/cefepime x1 then zosyn). Transferred to hospitalist service on 11/14.   Assessment & Plan:   Principal Problem:   Diabetic ketoacidosis ([place]) Active Problems:   PAF (paroxysmal atrial fibrillation) (HCC)   Diabetes ([place])   Hypertension associated with diabetes ([place])   Chronic alcohol use   Nicotine dependence   Seizure ([place])   Substance abuse ([place])   History of GI bleed   Cirrhosis of liver ([place])   Cocaine use  # T2DM # DKA Poorly controlled dm, A1c 11.7%. DKA is resolved - dm educator following - continue basal/bolus regimen  # SIRS # Bacteriuria # CAP? Initially hypotensive, hypoxic. Procal elevated. Possible pna on poor-quality CT. Urine culture growing e coli though patient asymptoamtic. Weaned to room air today. CXR w/ bilateral opacities - d/c zosyn, start augmentin - f/u urine culture  # Acute toxic metabolic encephalopathy Multifactorial, largely 2/2 DKA and cocaine intoxication. Much improved this morning. CT head on 11/12 nothing acute. 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 Secure chat w/ dr. Roland Rack, he advises maintain velcro splint and f/u in his office 1-2 weeks after discharge.  # Hx GI bleed No signs of bleeding, hgb stable - monitor  # Cirrhosis Appears compensated   DVT prophylaxis: home xarelto Code Status: full Family Communication: none at bedside. No answer when contact called today  Level of care: Stepdown, will order transfer to floor Status is: Inpatient Remains inpatient appropriate because: severity of illness    Consultants:  none  Procedures: none  Antimicrobials:  Vanc/cefepime>zosyn    Subjective: Feeling much better, no pain, no dyspnea, no dysuria or abd pain  Objective: Vitals:   06/03/22 0300 06/03/22 0400 06/03/22 0500 06/03/22 0600  BP: (!) 143/80 (!) 148/84 136/72   Pulse: (!) 110 (!) 107 (!) 107 (!) 105  Resp: '15 20 18 '$ (!) 21  Temp:      TempSrc:      SpO2: 100% 98% 99% 100%  Weight:   30.2 kg   Height:        Intake/Output Summary (Last 24 hours) at 06/03/2022 0830 Last data filed at 06/03/2022 0600 Gross per 24 hour  Intake 339.94 ml  Output 1525 ml  Net -1185.06 ml   Filed Weights   06/01/22 0840 06/02/22 0500 06/03/22 0500  Weight: 50 kg 50.9 kg 30.2 kg    Examination:  General exam: Appears calm and comfortable, chronically ill Respiratory system: rales bilaterally Cardiovascular system: S1 & S2 heard, RRR. No JVD, murmurs, rubs, gallops or clicks.  Gastrointestinal system:  Abdomen is nondistended, soft and nontender. No organomegaly or masses felt. Normal bowel sounds heard. Central nervous system: Alert and oriented. No focal neurological deficits. Extremities: Symmetric 5 x 5 power. Skin: No rashes, lesions or ulcers Psychiatry: calm    Data Reviewed: I have personally reviewed following labs and imaging studies  CBC: Recent Labs  Lab 06/01/22 0424 06/01/22 0904  06/02/22 0317 06/03/22 0417  WBC 20.6* 20.3* 13.7* 10.4  NEUTROABS 17.4*  --   --   --   HGB 13.4 12.0 11.7* 11.9*  HCT 45.5 35.9* 33.2* 34.7*  MCV 113.5* 101.4* 94.6 96.7  PLT 385 309 248 161   Basic Metabolic Panel: Recent Labs  Lab 06/01/22 1012 06/01/22 1330 06/01/22 1803 06/01/22 2211 06/02/22 0317 06/03/22 0417  NA  --  134* 137 140 142 145  K  --  4.3 4.1 3.9 3.6 3.1*  CL  --  104 108 109 113* 110  CO2  --  13* 21* 24 21* 25  GLUCOSE  --  469* 281* 326* 316* 100*  BUN  --  49* 45* 37* 32* 18  CREATININE  --  1.60* 1.23* 0.98 0.87 0.54  CALCIUM  --  9.5 9.4 9.0 8.7* 9.3  MG 2.4  --   --   --  2.1 2.2  PHOS 2.3*  --   --   --  3.3 1.7*   GFR: Estimated Creatinine Clearance: 34.3 mL/min (by C-G formula based on SCr of 0.54 mg/dL). Liver Function Tests: Recent Labs  Lab 06/01/22 0424  AST 29  ALT 18  ALKPHOS 234*  BILITOT 2.5*  PROT 8.4*  ALBUMIN 4.0   No results for input(s): "LIPASE", "AMYLASE" in the last 168 hours. No results for input(s): "AMMONIA" in the last 168 hours. Coagulation Profile: Recent Labs  Lab 06/01/22 0424  INR 1.7*   Cardiac Enzymes: Recent Labs  Lab 06/01/22 0424  CKTOTAL 206   BNP (last 3 results) No results for input(s): "PROBNP" in the last 8760 hours. HbA1C: Recent Labs    06/02/22 0317  HGBA1C 11.7*   CBG: Recent Labs  Lab 06/02/22 1916 06/02/22 2319 06/03/22 0338 06/03/22 0409 06/03/22 0731  GLUCAP 99 135* 67* 105* 140*   Lipid Profile: Recent Labs    06/01/22 0904  CHOL 157   Thyroid Function Tests: Recent Labs    06/01/22 0904  TSH 2.140  T4TOTAL 3.2*   Anemia Panel: No results for input(s): "VITAMINB12", "FOLATE", "FERRITIN", "TIBC", "IRON", "RETICCTPCT" in the last 72 hours. Urine analysis:    Component Value Date/Time   COLORURINE YELLOW (A) 06/01/2022 0424   APPEARANCEUR HAZY (A) 06/01/2022 0424   LABSPEC 1.020 06/01/2022 0424   PHURINE 5.0 06/01/2022 0424   GLUCOSEU >=500 (A)  06/01/2022 0424   HGBUR NEGATIVE 06/01/2022 0424   BILIRUBINUR NEGATIVE 06/01/2022 0424   KETONESUR 20 (A) 06/01/2022 0424   PROTEINUR 30 (A) 06/01/2022 0424   NITRITE NEGATIVE 06/01/2022 0424   LEUKOCYTESUR NEGATIVE 06/01/2022 0424   Sepsis Labs: '@LABRCNTIP'$ (procalcitonin:4,lacticidven:4)  ) Recent Results (from the past 240 hour(s))  Blood Culture (routine x 2)     Status: None (Preliminary result)   Collection Time: 06/01/22  4:24 AM   Specimen: BLOOD RIGHT WRIST  Result Value Ref Range Status   Specimen Description BLOOD RIGHT WRIST  Final   Special Requests   Final    BOTTLES DRAWN AEROBIC AND ANAEROBIC Blood Culture results may not be optimal due to an inadequate volume of blood received in  culture bottles   Culture   Final    NO GROWTH 2 DAYS Performed at [facility], [address] [place], [place] [address]    Report Status PENDING  Incomplete  Blood Culture (routine x 2)     Status: None (Preliminary result)   Collection Time: 06/01/22  4:24 AM   Specimen: Left Antecubital; Blood  Result Value Ref Range Status   Specimen Description LEFT ANTECUBITAL  Final   Special Requests   Final    BOTTLES DRAWN AEROBIC AND ANAEROBIC Blood Culture adequate volume   Culture   Final    NO GROWTH 2 DAYS Performed at [facility], [address], [place], [place] [address]    Report Status PENDING  Incomplete  Urine Culture     Status: Abnormal (Preliminary result)   Collection Time: 06/01/22  4:24 AM   Specimen: In/Out Cath Urine  Result Value Ref Range Status   Specimen Description   Final    IN/OUT CATH URINE Performed at [facility], [address] [place], [place] [address]    Special Requests   Final    NONE Performed at  [facility], [address] [place], [place] [address]    Culture (A)  Final    >=100,000 COLONIES/mL ESCHERICHIA COLI SUSCEPTIBILITIES TO FOLLOW Performed at [facility], [place] [address],  [place], [place] [address]    Report Status PENDING  Incomplete  SARS Coronavirus 2 by RT PCR (hospital order, performed in [facility] hospital lab) *cepheid single result test* Anterior Nasal Swab     Status: None   Collection Time: 06/01/22  7:34 AM   Specimen: Anterior Nasal Swab  Result Value Ref Range Status   SARS Coronavirus 2 by RT PCR NEGATIVE NEGATIVE Final    Comment: (NOTE) SARS-CoV-2 target nucleic acids are NOT DETECTED.  The SARS-CoV-2 RNA is generally detectable in upper and lower respiratory specimens during the acute phase of infection. The lowest concentration of SARS-CoV-2 viral copies this assay can detect is 250 copies / mL. A negative result does not preclude SARS-CoV-2 infection and should not be used as the sole basis for treatment or other patient management decisions.  A negative result may occur with improper specimen collection / handling, submission of specimen other than nasopharyngeal swab, presence of viral mutation(s) within the areas targeted by this assay, and inadequate number of viral copies (<250 copies / mL). A negative result must be combined with clinical observations, patient history, and epidemiological information.  Fact Sheet for Patients:   https://www.patel.info/  Fact Sheet for Healthcare Providers: https://hall.com/  This test is not yet approved or  cleared by the [place] FDA and has been authorized for detection and/or diagnosis of SARS-CoV-2 by FDA under an Emergency Use Authorization (EUA).  This EUA will remain in effect (meaning this test can be used) for the duration of the COVID-19 declaration under Section 564(b)(1) of the Act, 21 U.S.C. section 360bbb-3(b)(1), unless the authorization is terminated or revoked sooner.  Performed at [facility], [address] [place], [place] [address]   MRSA Next Gen by PCR, Nasal     Status: None   Collection Time: 06/01/22   8:42 AM   Specimen: Nasal Mucosa; Nasal Swab  Result Value Ref Range Status   MRSA by PCR Next Gen NOT DETECTED NOT DETECTED Final    Comment: (NOTE) The GeneXpert MRSA Assay (FDA approved for NASAL specimens only), is one component of a comprehensive  MRSA colonization surveillance program. It is not intended to diagnose MRSA infection nor to guide or monitor treatment for MRSA infections. Test performance is not FDA approved in patients less than 12 years old. Performed at [facility], [address], [place],  [address]          Radiology Studies: No results found.      Scheduled Meds:  Chlorhexidine Gluconate Cloth  6 each Topical O7078   folic acid  1 mg Intravenous Daily   heparin  5,000 Units Subcutaneous Q8H   insulin aspart  0-9 Units Subcutaneous Q4H   insulin glargine-yfgn  10 Units Subcutaneous BID   [START ON 06/05/2022] thiamine (VITAMIN B1) injection  100 mg Intravenous Daily   Continuous Infusions:  famotidine (PEPCID) IV Stopped (06/02/22 0831)   potassium PHOSPHATE IVPB (in mmol)     sodium chloride     thiamine (VITAMIN B1) injection Stopped (06/02/22 1226)     LOS: 2 days     Desma [name], MD [facility]   If 7PM-7AM, please contact night-coverage [email or web address] Password [email or web address] 06/03/2022, 8:30 AM

## 2022-06-03 NOTE — Progress Notes (Signed)
[place] for restarting of rivaroxaban Indication: atrial fibrillation  Allergies  Allergen Reactions   Glipizide     Other reaction(s): Other (See Comments)   Metformin Diarrhea   Codeine Itching   Levaquin [Levofloxacin In D5w] Itching    Patient Measurements: Height: '5\' 2"'$  (157.5 cm) Weight: 49.9 kg (110 lb) (bed not accurate) IBW/kg (Calculated) : 50.1  Vital Signs: Temp: 98.3 F (36.8 C) (11/14 0800) Temp Source: Oral (11/14 0800) BP: 150/79 (11/14 0800) Pulse Rate: 97 (11/14 0800)  Labs: Recent Labs    06/01/22 0424 06/01/22 0647 06/01/22 0904 06/01/22 1330 06/01/22 2211 06/02/22 0317 06/03/22 0417  HGB 13.4  --  12.0  --   --  11.7* 11.9*  HCT 45.5  --  35.9*  --   --  33.2* 34.7*  PLT 385  --  309  --   --  248 214  APTT 31  --   --   --   --   --   --   LABPROT 20.2*  --   --   --   --   --   --   INR 1.7*  --   --   --   --   --   --   CREATININE 2.21* 1.92* 1.93*   < > 0.98 0.87 0.54  CKTOTAL 206  --   --   --   --   --   --   TROPONINIHS 18* 19*  --   --   --   --   --    < > = values in this interval not displayed.    Estimated Creatinine Clearance: 56.7 mL/min (by C-G formula based on SCr of 0.54 mg/dL).   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   Constipation    Depression    Diabetes mellitus without complication (HCC)    Dysrhythmia     PAF   GERD (gastroesophageal reflux disease)    Hypertension    Neuropathy    Nicotine dependence 07/31/2020   PAF (paroxysmal atrial fibrillation) (HCC)    Panic attacks    Pneumonia    Tobacco use    Vitamin D deficiency     Medications:  Scheduled:   Chlorhexidine Gluconate Cloth  6 each Topical Q0600   diltiazem  180 mg Oral Daily   folic acid   1 mg Oral Daily   insulin aspart  0-5 Units Subcutaneous QHS   insulin aspart  0-9 Units Subcutaneous TID WC   insulin glargine-yfgn  8 Units Subcutaneous BID   pantoprazole  40 mg Oral Daily   rivaroxaban  20 mg Oral Q supper   thiamine  100 mg Oral Daily    Assessment: 61 y.o. female who presented to [facility] ER via EMS on 11/12 with unresponsiveness. She was found by her neighbor on the floor unresponsive with last known well time 20 hrs before being found.   Goal of Therapy:  Monitor platelets by anticoagulation protocol: Yes   Plan:  Restart rivaroxaban 20 mg po once daily (CrCl >50 mL/minute: No dosage adjustment necessary) Serum creatinine and WBC at least once weekly unless more frequent monitoring is needed  Dallie Piles 06/03/2022,8:48 AM

## 2022-06-04 ENCOUNTER — Encounter: Payer: Self-pay | Admitting: Obstetrics and Gynecology

## 2022-06-04 ENCOUNTER — Other Ambulatory Visit: Payer: Self-pay

## 2022-06-04 DIAGNOSIS — R531 Weakness: Secondary | ICD-10-CM

## 2022-06-04 DIAGNOSIS — F1910 Other psychoactive substance abuse, uncomplicated: Secondary | ICD-10-CM

## 2022-06-04 DIAGNOSIS — E1169 Type 2 diabetes mellitus with other specified complication: Secondary | ICD-10-CM

## 2022-06-04 LAB — CBC
HCT: 36.6 % (ref 36.0–46.0)
Hemoglobin: 12.6 g/dL (ref 12.0–15.0)
MCH: 33.2 pg (ref 26.0–34.0)
MCHC: 34.4 g/dL (ref 30.0–36.0)
MCV: 96.6 fL (ref 80.0–100.0)
Platelets: 182 10*3/uL (ref 150–400)
RBC: 3.79 MIL/uL — ABNORMAL LOW (ref 3.87–5.11)
RDW: 12.5 % (ref 11.5–15.5)
WBC: 8.3 10*3/uL (ref 4.0–10.5)
nRBC: 0 % (ref 0.0–0.2)

## 2022-06-04 LAB — MISC LABCORP TEST (SEND OUT): Labcorp test code: 83935

## 2022-06-04 LAB — HIV ANTIBODY (ROUTINE TESTING W REFLEX): HIV Screen 4th Generation wRfx: NONREACTIVE

## 2022-06-04 LAB — GLUCOSE, CAPILLARY
Glucose-Capillary: 241 mg/dL — ABNORMAL HIGH (ref 70–99)
Glucose-Capillary: 393 mg/dL — ABNORMAL HIGH (ref 70–99)
Glucose-Capillary: 250 mg/dL — ABNORMAL HIGH (ref 70–99)

## 2022-06-04 LAB — HEPATITIS B SURFACE ANTIGEN: Hepatitis B Surface Ag: NONREACTIVE

## 2022-06-04 LAB — RPR: RPR Ser Ql: NONREACTIVE

## 2022-06-04 LAB — BASIC METABOLIC PANEL
Anion gap: 5 (ref 5–15)
BUN: 23 mg/dL (ref 8–23)
CO2: 29 mmol/L (ref 22–32)
Calcium: 9.2 mg/dL (ref 8.9–10.3)
Chloride: 104 mmol/L (ref 98–111)
Creatinine, Ser: 0.59 mg/dL (ref 0.44–1.00)
GFR, Estimated: >60 mL/min (ref >60)
Glucose, Bld: 194 mg/dL — ABNORMAL HIGH (ref 70–99)
Potassium: 3.6 mmol/L (ref 3.5–5.1)
Sodium: 138 mmol/L (ref 135–145)

## 2022-06-04 LAB — PHOSPHORUS: Phosphorus: 3.5 mg/dL (ref 2.5–4.6)

## 2022-06-04 MED ORDER — LORAZEPAM 1 MG PO TABS
1.0000 mg | ORAL_TABLET | ORAL | Status: DC | PRN
  Administered 2022-06-04 (×2): 1 mg via ORAL
  Filled 2022-06-04: qty 1

## 2022-06-04 MED ORDER — NICOTINE 21 MG/24HR TD PT24
21.0000 mg | MEDICATED_PATCH | Freq: Every day | TRANSDERMAL | Status: DC
  Administered 2022-06-04: 21 mg via TRANSDERMAL
  Filled 2022-06-04: qty 1

## 2022-06-04 MED ORDER — LORAZEPAM 2 MG/ML IJ SOLN: 1.0000 mg | INTRAMUSCULAR | Status: DC | PRN

## 2022-06-04 NOTE — Progress Notes (Signed)
Patient leaving AMA. MD made aware. Patient verbalized understanding of all risks associated with leaving AMA including financial and physical.

## 2022-06-04 NOTE — Evaluation (Signed)
Physical Therapy Evaluation Patient Details Name: Michaela Johnson MRN: [id number] DOB: 05/14/1961 Today's Date: 06/04/2022  History of Present Illness  Admitted to the ICU on 11/12 after presenting via EMS after being found down and unresponsive by a neighbor. On EMS arrival glucose >500, bradycardic, and hypotensive. On arrival found to be in DKA with hyperkalemia. Cocaine positive. Encephalopathic. Treated with IV insulin and pressor support (levopheg gtt). Also empiric abx (vanc/cefepime x1 then zosyn). Transferred to hospitalist service on 11/14. PMH includes arrythmia, DM, Dysrhythmia, HTN, Meuropathy, PAF, and pneumonia.  Clinical Impression  Pt found supine in bed upon PT entry and motivated for mobility. Pt Mod I with bed mobility. Sit<>Stand with Mod I and RW. Pt ambulated 220 ft with RW and supervision. Pt reported feeling off balance, but no LOB noted. Pt would benefit from skilled physical therapy to address the listed deficits (see below) to increase independence with ADLs and function. Current Recommendation is HHPT with intermittent assistance for safety and to return pt to PLOF.          Recommendations for follow up therapy are one component of a multi-disciplinary discharge planning process, led by the attending physician.  Recommendations may be updated based on patient status, additional functional criteria and insurance authorization.  Follow Up Recommendations Home health PT      Assistance Recommended at Discharge Intermittent Supervision/Assistance  Patient can return home with the following  A little help with walking and/or transfers;A little help with bathing/dressing/bathroom;Assistance with cooking/housework;Assist for transportation;Help with stairs or ramp for entrance    Equipment Recommendations None recommended by PT  Recommendations for Other Services       Functional Status Assessment Patient has had a recent decline in their functional status and  demonstrates the ability to make significant improvements in function in a reasonable and predictable amount of time.     Precautions / Restrictions Precautions Precautions: Fall Restrictions Weight Bearing Restrictions: No      Mobility  Bed Mobility Overal bed mobility: Modified Independent                  Transfers Overall transfer level: Modified independent Equipment used: Rolling walker (2 wheels)                    Ambulation/Gait Ambulation/Gait assistance: Supervision Gait Distance (Feet): 220 Feet Assistive device: Rolling walker (2 wheels) Gait Pattern/deviations: WFL(Within Functional Limits), Step-through pattern       General Gait Details: using walker mostly for bilateral ue support for balance  Stairs            Wheelchair Mobility    Modified Rankin (Stroke Patients Only)       Balance Overall balance assessment: Needs assistance Sitting-balance support: Feet unsupported Sitting balance-Leahy Scale: Good     Standing balance support: No upper extremity supported, Bilateral upper extremity supported Standing balance-Leahy Scale: Good Standing balance comment: Pt able to maintain static balance without UE support                             Pertinent Vitals/Pain Pain Assessment Pain Assessment: No/denies pain    Home Living Family/patient expects to be discharged to:: Private residence Living Arrangements: Alone Available Help at Discharge: Other (Comment);Friend(s) Type of Home: Apartment Home Access: Stairs to enter Entrance Stairs-Rails: None Entrance Stairs-Number of Steps: 1   Home Layout: One level Home Equipment: Shower seat;Cane - single point;Wheelchair - Publishing copy (2 wheels)  Prior Function Prior Level of Function : Independent/Modified Independent;Driving             Mobility Comments: No AD for mobility ADLs Comments: Independent with ADLs     Hand Dominance    Dominant Hand: Right    Extremity/Trunk Assessment   Upper Extremity Assessment Upper Extremity Assessment: Overall WFL for tasks assessed RUE Deficits / Details: non-union, distal radius fx, from fall earlier this year    Lower Extremity Assessment Lower Extremity Assessment: Generalized weakness    Cervical / Trunk Assessment Cervical / Trunk Assessment: Normal  Communication   Communication: No difficulties  Cognition Arousal/Alertness: Awake/alert Behavior During Therapy: WFL for tasks assessed/performed Overall Cognitive Status: Within Functional Limits for tasks assessed                                          General Comments      Exercises     Assessment/Plan    PT Assessment Patient needs continued PT services  PT Problem List Decreased strength;Decreased balance;Decreased knowledge of use of DME;Decreased activity tolerance       PT Treatment Interventions DME instruction;Functional mobility training;Balance training;Patient/family education;Gait training;Therapeutic activities;Neuromuscular re-education;Stair training;Therapeutic exercise    PT Goals (Current goals can be found in the Care Plan section)  Acute Rehab PT Goals Patient Stated Goal: to return home PT Goal Formulation: With patient Time For Goal Achievement: 06/18/22 Potential to Achieve Goals: Good    Frequency Min 2X/week     Co-evaluation               AM-PAC PT "6 Clicks" Mobility  Outcome Measure Help needed turning from your back to your side while in a flat bed without using bedrails?: None Help needed moving from lying on your back to sitting on the side of a flat bed without using bedrails?: None Help needed moving to and from a bed to a chair (including a wheelchair)?: None Help needed standing up from a chair using your arms (e.g., wheelchair or bedside chair)?: None Help needed to walk in hospital room?: A Little Help needed climbing 3-5 steps with a  railing? : A Little 6 Click Score: 22    End of Session Equipment Utilized During Treatment: Gait belt Activity Tolerance: Patient tolerated treatment well Patient left: in bed;with bed alarm set;with call bell/phone within reach Nurse Communication: Mobility status PT Visit Diagnosis: Unsteadiness on feet (R26.81);Muscle weakness (generalized) (M62.81)    Time: 8756-4332 PT Time Calculation (min) (ACUTE ONLY): 14 min   Charges:              Claiborne Billings O'Daniel, SPT  06/04/2022, 3:25 PM

## 2022-06-04 NOTE — Inpatient Diabetes Management (Signed)
Inpatient Diabetes Program Recommendations  AACE/ADA: New Consensus Statement on Inpatient Glycemic Control (2015)  Target Ranges:  Prepandial:   less than 140 mg/dL      Peak postprandial:   less than 180 mg/dL (1-2 hours)      Critically ill patients:  140 - 180 mg/dL   Lab Results  Component Value Date   GLUCAP 241 (H) 06/04/2022   HGBA1C 11.7 (H) 06/02/2022    Review of Glycemic Control  Latest Reference Range & Units 06/03/22 07:31 06/03/22 11:28 06/03/22 15:04 06/03/22 21:45 06/04/22 08:06 06/04/22 11:50  Glucose-Capillary 70 - 99 mg/dL 140 (H) 297 (H) 238 (H) 214 (H) 241 (H) 393 (H)  (H): Data is abnormally high  Diabetes history: DM2 Outpatient Diabetes medications: Lantus 36 units QD, Novolog 2 units QD, Jardiance 10 mg QD Current orders for Inpatient glycemic control: Semglee 8 units BID, Novolog 0-15 units TID and 0-5 units QHS   Inpatient Diabetes Recommendations:  Novolog 3 units TID with meals if consumes at least 50%  Spoke with patient at bedside.  When asked what she takes for her diabetes she was very confused.  Eventually she stated that she takes 36 units of Lantus QAM and 2 units of Novolog QAM and a round pill.  She states she does not skip doses but might have forgotten to take her insulin the day she became sick because she was confused; thought maybe she had food poisoning.  When asked why she doesn't take her Novolog TID she states, "because I'm not supposed to".  Explained rapid insulin to her and that it should be taken with each of her meals.  She is current with Dr. Elsworth Soho at the clinic and they help her obtain her medications as she is uninsured.  She states she checks her blood sugar once in a while.  Asked her to check daily fasting and before each meal and HS.  She asks about a [employer] but unfortunately this would be unaffordable at $75/month.       Will continue to follow while inpatient.  Thank you, Michaela Dixon, MSN, [facility]  Coordinator Inpatient Diabetes Program [phone] (team pager from 8a-5p)

## 2022-06-04 NOTE — Evaluation (Signed)
Occupational Therapy Evaluation Patient Details Name: Michaela Johnson MRN: [id number] DOB: 1961/06/22 Today's Date: 06/04/2022   History of Present Illness Admitted to the ICU on 11/12 after presenting via EMS after being found down and unresponsive by a neighbor. On EMS arrival glucose >500, bradycardic, and hypotensive. On arrival found to be in DKA with hyperkalemia. Cocaine positive. Encephalopathic. Treated with IV insulin and pressor support (levopheg gtt). Also empiric abx (vanc/cefepime x1 then zosyn). Transferred to hospitalist service on 11/14.   Clinical Impression   Michaela Johnson presents with generalized weakness and reduced endurance. She lives alone in an apartment, drives, is not employed at present, and is Mod I in fxl mobility and BADL/IADL. During today's evaluation, pt is able to perform bed mobility, transfers, grooming, toileting, dressing, all with SUPV-Mod I. Utilized RW for safety, although pt states she generally does not walk with AD (occasionally uses a SPC if she is feeling "shaky"). Pt displayed some anxiety, stating she wanted to leave hospital ASAP. Provided educ re: importance of DM mgmt, close and prompt attention to any foot injuries (pt had cat scratch on toe that she reported was healing slowly and for which she had not received any medical care), resources for harm reduction/cessation programs for tobacco, alcohol, and drugs. Pt verbalizes understanding, states she has been successful of late in reducing her alcohol use (now down to 3-4 drinks per day) and smoking (now down to 4-5 cigarettes per day). Provided educ re: donning/doffing hand splint, with pt able to provide teach back. No additional OT services required at this time.     Recommendations for follow up therapy are one component of a multi-disciplinary discharge planning process, led by the attending physician.  Recommendations may be updated based on patient status, additional functional criteria and  insurance authorization.   Follow Up Recommendations  No OT follow up     Assistance Recommended at Discharge PRN  Patient can return home with the following      Functional Status Assessment  Patient has not had a recent decline in their functional status  Equipment Recommendations  None recommended by OT    Recommendations for Other Services       Precautions / Restrictions Precautions Precautions: Fall Restrictions Weight Bearing Restrictions: No      Mobility Bed Mobility Overal bed mobility: Modified Independent                  Transfers Overall transfer level: Modified independent Equipment used: Rolling walker (2 wheels)                      Balance Overall balance assessment: Needs assistance Sitting-balance support: Feet unsupported, Single extremity supported Sitting balance-Leahy Scale: Good     Standing balance support: No upper extremity supported, Bilateral upper extremity supported Standing balance-Leahy Scale: Good Standing balance comment: SUPV for safety                           ADL either performed or assessed with clinical judgement   ADL Overall ADL's : Modified independent                                             Vision         Perception     Praxis      Pertinent Vitals/Pain  Pain Assessment Pain Assessment: No/denies pain     Hand Dominance Right   Extremity/Trunk Assessment Upper Extremity Assessment Upper Extremity Assessment: Overall WFL for tasks assessed;RUE deficits/detail RUE Deficits / Details: non-union, distal radius fx, from fall earlier this year   Lower Extremity Assessment Lower Extremity Assessment: Overall WFL for tasks assessed   Cervical / Trunk Assessment Cervical / Trunk Assessment: Normal   Communication Communication Communication: No difficulties   Cognition Arousal/Alertness: Awake/alert Behavior During Therapy: WFL for tasks  assessed/performed Overall Cognitive Status: Within Functional Limits for tasks assessed                                       General Comments       Exercises Other Exercises Other Exercises: Provided educ re: substance use harm reduction, DM mgmt, foot care, community supports, hand splint use   Shoulder Instructions      Home Living Family/patient expects to be discharged to:: Private residence Living Arrangements: Alone   Type of Home: Apartment Home Access: Level entry     Home Layout: One level     Bathroom Shower/Tub: Teacher, early years/pre: Standard     Home Equipment: Shower seat          Prior Functioning/Environment Prior Level of Function : Independent/Modified Independent;Driving             Mobility Comments: No AD for mobility ADLs Comments: Independent with ADLs        OT Problem List: Decreased activity tolerance;Impaired UE functional use      OT Treatment/Interventions:      OT Goals(Current goals can be found in the care plan section) Acute Rehab OT Goals Patient Stated Goal: to get out of hospital ASAP OT Goal Formulation: With patient Time For Goal Achievement: 06/18/22 Potential to Achieve Goals: Good  OT Frequency:      Co-evaluation              AM-PAC OT "6 Clicks" Daily Activity     Outcome Measure Help from another person eating meals?: None Help from another person taking care of personal grooming?: None Help from another person toileting, which includes using toliet, bedpan, or urinal?: None Help from another person bathing (including washing, rinsing, drying)?: A Little Help from another person to put on and taking off regular upper body clothing?: None Help from another person to put on and taking off regular lower body clothing?: None 6 Click Score: 23   End of Session Equipment Utilized During Treatment: Rolling walker (2 wheels) Nurse Communication: Mobility status  Activity  Tolerance: Patient tolerated treatment well Patient left: in bed;with call bell/phone within reach  OT Visit Diagnosis: Unsteadiness on feet (R26.81);Muscle weakness (generalized) (M62.81)                Time: 1010-1035 OT Time Calculation (min): 25 min Charges:  OT General Charges $OT Visit: 1 Visit OT Evaluation $OT Eval Low Complexity: 1 Low OT Treatments $Self Care/Home Management : 23-37 mins Josiah Lobo, PhD, MS, OTR/L 06/04/22, 12:25 PM

## 2022-06-04 NOTE — TOC Initial Note (Addendum)
Transition of Care [facility]) - Initial/Assessment Note    Patient Details  Name: Michaela Johnson MRN: [id number] Date of Birth: 04-15-1961  Transition of Care [facility]) CM/SW Contact:    Colen Darling, [place] Phone Number: 06/04/2022, 1:20 PM  Clinical Narrative:                  The patient's PCP is [name] NP. The patient drives herself to appointments. Her pharmacy is [facility]. She picks up her medications. DME in the home includes walker, wheelchair, cane and tub chair. The patient has not had home health.  Expected Discharge Plan: Home/Self Care Barriers to Discharge: Continued Medical Work up   Patient Goals and CMS Choice Patient states their goals for this hospitalization and ongoing recovery are:: wants to go home      Expected Discharge Plan and Services Expected Discharge Plan: Home/Self Care   Discharge Planning Services: CM Consult   Living arrangements for the past 2 months: Apartment                 DME Arranged: N/A DME Agency: NA       HH Arranged: NA [place] Agency: NA        Prior Living Arrangements/Services Living arrangements for the past 2 months: Apartment Lives with:: Self Patient language and need for interpreter reviewed:: Yes Do you feel safe going back to the place where you live?: Yes      Need for Family Participation in Patient Care: Yes (Comment) Care giver support system in place?: No (comment)   Criminal Activity/Legal Involvement Pertinent to Current Situation/Hospitalization: No - Comment as needed  Activities of Daily Living Home Assistive Devices/Equipment: None ADL Screening (condition at time of admission) Patient's cognitive ability adequate to safely complete daily activities?: Yes Is the patient deaf or have difficulty hearing?: No Does the patient have difficulty seeing, even when wearing glasses/contacts?: No Does the patient have difficulty concentrating, remembering, or making decisions?:  Yes Patient able to express need for assistance with ADLs?: Yes Does the patient have difficulty dressing or bathing?: Yes Independently performs ADLs?: No Does the patient have difficulty walking or climbing stairs?: Yes Weakness of Legs: Both Weakness of Arms/Hands: None  Permission Sought/Granted                  Emotional Assessment Appearance:: Appears older than stated age Attitude/Demeanor/Rapport: Engaged Affect (typically observed): Accepting Orientation: : Oriented to Self, Oriented to Place, Oriented to  Time, Oriented to Situation Alcohol / Substance Use: Illicit Drugs Psych Involvement: No (comment)  Admission diagnosis:  Hyperkalemia [E87.5] Diabetic ketoacidosis ([place]) [E11.10] Shock ([place]) [R57.9] Hyperglycemia [R73.9] AKI (acute kidney injury) ([place]) [N17.9] Hypothermia, initial encounter [T68.XXXA] Altered mental status, unspecified altered mental status type [R41.82] Patient Active Problem List   Diagnosis Date Noted   History of GI bleed 06/03/2022   Cirrhosis of liver ([place]) 06/03/2022   Cocaine use 06/03/2022   Diabetic ketoacidosis ([place]) 06/01/2022   Smoker 05/01/2022   Marijuana use 05/01/2022   Depression, major, single episode, complete remission ([place]) dx'd 15 years ago 05/01/2022   Acute postoperative anemia due to expected blood loss 01/05/2022   Closed right radial fracture 01/03/2022   Closed intertrochanteric fracture of hip, right, initial encounter ([place]) 01/02/2022   Multiple lung nodules on CT 04/18/2021   Vitamin D deficiency    Neuropathy    Chronic anticoagulation 07/31/2020   Chronic alcohol use 07/31/2020   Nicotine dependence 07/31/2020   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: [phone]: [phone] [address] [place] [address] [place] [address]: [phone]: [address]: [phone]: [phone]    Social Determinants of Health (SDOH) Interventions    Readmission Risk Interventions    06/04/2022    8:58 AM  Readmission Risk Prevention Plan  Transportation Screening Complete  PCP or Specialist Appt within 5-7 Days Complete  Home Care Screening Complete  Medication Review (RN CM) Complete

## 2022-06-04 NOTE — Progress Notes (Signed)
PROGRESS NOTE    Michaela Johnson  XHB:[id number] DOB: December 29, 1960 DOA: 06/01/2022 PCP: [name], NP    Assessment & Plan:   Principal Problem:   Diabetic ketoacidosis ([place]) Active Problems:   PAF (paroxysmal atrial fibrillation) ([place])   Diabetes ([place])   Hypertension associated with diabetes ([place])   Chronic alcohol use   Nicotine dependence   Seizure ([place])   Substance abuse ([place])   History of GI bleed   Cirrhosis of liver ([place])   Cocaine use  Assessment and Plan:  DM2: poorly controlled, HbA1c 11.7%. Continue on glargine, SSI w/ accuchecks   DKA: resolved   SIRS: see Dr. Marcia Brash note on how pt met SIRS criteria  Possible CAP: initially hypotensive, hypoxic. Procal elevated. Possible pna on poor-quality CT. CXR shows b/l opacities. Continue on augmentin   Bacteriuria: urine culture growing e coli though patient asymptomatic.  Acute toxic metabolic encephalopathy: likely multifactorial, largely secondary to DKA and cocaine intoxication. CT head on 11/12 shows no acute intracranial abnormalities   A-fib: likely PAF. Continue on xarelto, diltiazem    HTN: on low end of normal. Off of pressors. Holding anti-HTN meds    Polysubstance abuse: cocaine positive on admission & hx heavy drinking as well. Continue on CIWA protocol. 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 Continue w/ splint and f/u in ortho surg office in 1-2 weeks, Dr. Roland Rack   Hx GI bleed: H&H are WNL.   Cirrhosis: appears compensated       DVT prophylaxis: xarelto  Code Status: full  Family Communication:  Disposition Plan: depends on PT/OT recs  Level of care: Telemetry Medical  Status is: Inpatient Remains inpatient appropriate because: severity of illness     Consultants:    Procedures:  Antimicrobials: augmentin    Subjective: Pt c/o  malaise  Objective: Vitals:   06/03/22 1800 06/03/22 2029 06/03/22 2340 06/04/22 0635  BP: 111/64 120/66 (!) 105/53 111/64  Pulse: 83 84 90 84  Resp: _0 Temp: 99.1 F (37.3 C) 98.7 F (37.1 C) (!) 100.8 F (38.2 C) 99.6 F (37.6 C)  TempSrc:  Oral  Oral  SpO2: 97% 97% 95% 93%  Weight:      Height:        Intake/Output Summary (Last 24 hours) at 06/04/2022 0730 Last data filed at 06/03/2022 1700 Gross per 24 hour  Intake 1194.88 ml  Output 1800 ml  Net -605.12 ml   Filed Weights   06/01/22 0840 06/02/22 0500 06/03/22 0500  Weight: 50 kg 50.9 kg 49.9 kg    Examination:  General exam: Appears calm and comfortable  Respiratory system: diminished breath sounds b/l  Cardiovascular system: S1 & S2+. No rubs, gallops or clicks.  Gastrointestinal system: Abdomen is nondistended, soft and nontender. Normal bowel sounds heard. Central nervous system: Alert and oriented. Moves all extremities  Psychiatry: Judgement and insight appear normal. Flat mood and affect      Data Reviewed: I have personally reviewed following labs and imaging studies  CBC: Recent Labs  Lab 06/01/22 0424 06/01/22 0904 06/02/22 0317 06/03/22 0417 06/04/22 0407  WBC 20.6* 20.3* 13.7* 10.4 8.3  NEUTROABS 17.4*  --   --   --   --   HGB 13.4 12.0 11.7* 11.9* 12.6  HCT 45.5 35.9* 33.2* 34.7* 36.6  MCV 113.5* 101.4* 94.6 96.7 96.6  PLT 385  309 248 214 606   Basic Metabolic Panel: Recent Labs  Lab 06/01/22 1012 06/01/22 1330 06/01/22 1803 06/01/22 2211 06/02/22 0317 06/03/22 0417 06/04/22 0407  NA  --    < > 137 140 142 145 138  K  --    < > 4.1 3.9 3.6 3.1* 3.6  CL  --    < > 108 109 113* 110 104  CO2  --    < > 21* 24 21* 25 29  GLUCOSE  --    < > 281* 326* 316* 100* 194*  BUN  --    < > 45* 37* 32* 18 23  CREATININE  --    < > 1.23* 0.98 0.87 0.54 0.59  CALCIUM  --    < > 9.4 9.0 8.7* 9.3 9.2  MG 2.4  --   --   --  2.1 2.2  --   PHOS 2.3*  --   --   --  3.3 1.7* 3.5   <  > = values in this interval not displayed.   GFR: Estimated Creatinine Clearance: 56.7 mL/min (by C-G formula based on SCr of 0.59 mg/dL). Liver Function Tests: Recent Labs  Lab 06/01/22 0424  AST 29  ALT 18  ALKPHOS 234*  BILITOT 2.5*  PROT 8.4*  ALBUMIN 4.0   No results for input(s): "LIPASE", "AMYLASE" in the last 168 hours. No results for input(s): "AMMONIA" in the last 168 hours. Coagulation Profile: Recent Labs  Lab 06/01/22 0424  INR 1.7*   Cardiac Enzymes: Recent Labs  Lab 06/01/22 0424  CKTOTAL 206   BNP (last 3 results) No results for input(s): "PROBNP" in the last 8760 hours. HbA1C: Recent Labs    06/02/22 0317  HGBA1C 11.7*   CBG: Recent Labs  Lab 06/03/22 0409 06/03/22 0731 06/03/22 1128 06/03/22 1504 06/03/22 2145  GLUCAP 105* 140* 297* 238* 214*   Lipid Profile: Recent Labs    06/01/22 0904  CHOL 157   Thyroid Function Tests: Recent Labs    06/01/22 0904  TSH 2.140  T4TOTAL 3.2*   Anemia Panel: No results for input(s): "VITAMINB12", "FOLATE", "FERRITIN", "TIBC", "IRON", "RETICCTPCT" in the last 72 hours. Sepsis Labs: Recent Labs  Lab 06/01/22 0424 06/01/22 0647 06/01/22 0734 06/02/22 0317 06/03/22 0417  PROCALCITON  --  2.39  --  3.45 0.82  LATICACIDVEN 2.4* 2.4* 1.2  --   --     Recent Results (from the past 240 hour(s))  Blood Culture (routine x 2)     Status: None (Preliminary result)   Collection Time: 06/01/22  4:24 AM   Specimen: BLOOD RIGHT WRIST  Result Value Ref Range Status   Specimen Description BLOOD RIGHT WRIST  Final   Special Requests   Final    BOTTLES DRAWN AEROBIC AND ANAEROBIC Blood Culture results may not be optimal due to an inadequate volume of blood received in culture bottles   Culture   Final    NO GROWTH 3 DAYS Performed at [facility], [address] [place], [place] [address]    Report Status PENDING  Incomplete  Blood Culture (routine x 2)     Status: None (Preliminary  result)   Collection Time: 06/01/22  4:24 AM   Specimen: Left Antecubital; Blood  Result Value Ref Range Status   Specimen Description LEFT ANTECUBITAL  Final   Special Requests   Final    BOTTLES DRAWN AEROBIC AND ANAEROBIC Blood Culture adequate volume   Culture  Final    NO GROWTH 3 DAYS Performed at [facility], [address] [place], [place] [address]    Report Status PENDING  Incomplete  Urine Culture     Status: Abnormal   Collection Time: 06/01/22  4:24 AM   Specimen: In/Out Cath Urine  Result Value Ref Range Status   Specimen Description   Final    IN/OUT CATH URINE Performed at [facility], [address] [place], [place] [address]    Special Requests   Final    NONE Performed at [facility], [address] [place], [place] [address]    Culture >=100,000 COLONIES/mL ESCHERICHIA COLI (A)  Final   Report Status 06/03/2022 FINAL  Final   Organism ID, Bacteria ESCHERICHIA COLI (A)  Final      Susceptibility   Escherichia coli - MIC*    AMPICILLIN 4 SENSITIVE Sensitive     CEFAZOLIN <=4 SENSITIVE Sensitive     CEFEPIME <=0.12 SENSITIVE Sensitive     CEFTRIAXONE <=0.25 SENSITIVE Sensitive     CIPROFLOXACIN <=0.25 SENSITIVE Sensitive     GENTAMICIN <=1 SENSITIVE Sensitive     IMIPENEM <=0.25 SENSITIVE Sensitive     NITROFURANTOIN <=16 SENSITIVE Sensitive     TRIMETH/SULFA <=20 SENSITIVE Sensitive     AMPICILLIN/SULBACTAM <=2 SENSITIVE Sensitive     PIP/TAZO <=4 SENSITIVE Sensitive     * >=100,000 COLONIES/mL ESCHERICHIA COLI  SARS Coronavirus 2 by RT PCR (hospital order, performed in [facility] hospital lab) *cepheid single result test* Anterior Nasal Swab     Status: None   Collection Time: 06/01/22  7:34 AM   Specimen: Anterior Nasal Swab  Result Value Ref Range Status   SARS Coronavirus 2 by RT PCR NEGATIVE NEGATIVE Final    Comment: (NOTE) SARS-CoV-2 target nucleic acids are NOT DETECTED.  The SARS-CoV-2 RNA is generally  detectable in upper and lower respiratory specimens during the acute phase of infection. The lowest concentration of SARS-CoV-2 viral copies this assay can detect is 250 copies / mL. A negative result does not preclude SARS-CoV-2 infection and should not be used as the sole basis for treatment or other patient management decisions.  A negative result may occur with improper specimen collection / handling, submission of specimen other than nasopharyngeal swab, presence of viral mutation(s) within the areas targeted by this assay, and inadequate number of viral copies (<250 copies / mL). A negative result must be combined with clinical observations, patient history, and epidemiological information.  Fact Sheet for Patients:   https://www.patel.info/  Fact Sheet for Healthcare Providers: https://hall.com/  This test is not yet approved or  cleared by the [place] FDA and has been authorized for detection and/or diagnosis of SARS-CoV-2 by FDA under an Emergency Use Authorization (EUA).  This EUA will remain in effect (meaning this test can be used) for the duration of the COVID-19 declaration under Section 564(b)(1) of the Act, 21 U.S.C. section 360bbb-3(b)(1), unless the authorization is terminated or revoked sooner.  Performed at [facility], [address] [place], [place] [address]   MRSA Next Gen by PCR, Nasal     Status: None   Collection Time: 06/01/22  8:42 AM   Specimen: Nasal Mucosa; Nasal Swab  Result Value Ref Range Status   MRSA by PCR Next Gen NOT DETECTED NOT DETECTED Final    Comment: (NOTE) The GeneXpert MRSA Assay (FDA approved for NASAL specimens only), is one component of a comprehensive MRSA colonization surveillance program. It  is not intended to diagnose MRSA infection nor to guide or monitor treatment for MRSA infections. Test performance is not FDA approved in patients less than 63  years old. Performed at [facility], [address], [place], [place] [address]          Radiology Studies: DG Chest Eighty Four 1 View  Result Date: 06/03/2022 CLINICAL DATA:  CAP (community acquired pneumonia) EXAM: PORTABLE CHEST 1 VIEW COMPARISON:  Chest x-ray June 01, 2021. FINDINGS: Similar versus slightly improved mild bilateral interstitial opacities. No confluent consolidation. No visible pleural effusions or pneumothorax. No evidence of acute osseous abnormality. IMPRESSION: Similar versus slightly improved mild bilateral interstitial opacities. Electronically Signed   By: Margaretha Sheffield M.D.   On: 06/03/2022 09:22        Scheduled Meds:  amoxicillin-clavulanate  1 tablet Oral Q12H   diltiazem  180 mg Oral Daily   folic acid  1 mg Oral Daily   gabapentin  900 mg Oral QHS   insulin aspart  0-15 Units Subcutaneous TID WC   insulin aspart  0-5 Units Subcutaneous QHS   insulin glargine-yfgn  8 Units Subcutaneous BID   pantoprazole  40 mg Oral Daily   rivaroxaban  20 mg Oral Q supper   thiamine  100 mg Oral Daily   Continuous Infusions:  sodium chloride       LOS: 3 days    Time spent: 35 mins     Wyvonnia Dusky, MD [facility] Pager [phone]  If 7PM-7AM, please contact night-coverage [email or web address] 06/04/2022, 7:30 AM

## 2022-06-04 NOTE — Progress Notes (Signed)
Mobility Specialist - Progress Note   06/04/22 1447  Mobility  Activity Ambulated independently in hallway  Level of Assistance Modified independent, requires aide device or extra time  Assistive Device Front wheel walker  Distance Ambulated (ft) 160 ft  Activity Response Tolerated well  Mobility Referral Yes  $Mobility charge 1 Mobility   Pt supine upon entry, utilizing RA. Pt completed bed mob indep, STS to RW and amb ModI. Pt ambulated one lap around NS with RW, tolerated well. Max VC for navigating RW and spatial awareness, MinA for correction. Pt returned to room, left supine with alarm set and needs within reach.   [place]ie [place] Mobility Specialist 06/04/22 2:52 PM

## 2022-06-04 NOTE — TOC Initial Note (Signed)
Transition of Care [facility]) - Initial/Assessment Note    Patient Details  Name: Michaela Johnson MRN: [id number] Date of Birth: [birth year]  Transition of [facility]) CM/SW Contact:    Shelbie Hutching, RN Phone Number: 06/04/2022, 9:00 AM  Clinical Narrative:                 Patient admitted to the hospital with DKA and altered mental status.  RNCM met with patient at the bedside introduced self and explained role in DC planning.  Patient reports that she is from home where she lives alone and is independent.  She drives.  Patient is current with her PCP at [facility] and she gets her prescriptions from there.  Patient has no concerns and no questions.  Patient reports that she has an upcoming appointment [facility].  When asked how patient will get home she says a taxi.    Patient wants to go home.   Expected Discharge Plan: Home/Self Care Barriers to Discharge: Continued Medical Work up   Patient Goals and CMS Choice Patient states their goals for this hospitalization and ongoing recovery are:: wants to go home      Expected Discharge Plan and Services Expected Discharge Plan: Home/Self Care   Discharge Planning Services: CM Consult   Living arrangements for the past 2 months: Apartment                 DME Arranged: N/A DME Agency: NA       HH Arranged: NA [place] Agency: NA        Prior Living Arrangements/Services Living arrangements for the past 2 months: Apartment Lives with:: Self Patient language and need for interpreter reviewed:: Yes Do you feel safe going back to the place where you live?: Yes      Need for Family Participation in Patient Care: Yes (Comment) Care giver support system in place?: No (comment)   Criminal Activity/Legal Involvement Pertinent to Current Situation/Hospitalization: No - Comment as needed  Activities of Daily Living      Permission Sought/Granted                  Emotional Assessment Appearance:: Appears older than  stated age Attitude/Demeanor/Rapport: Engaged Affect (typically observed): Accepting Orientation: : Oriented to Self, Oriented to Place, Oriented to  Time, Oriented to Situation Alcohol / Substance Use: Illicit Drugs Psych Involvement: No (comment)  Admission diagnosis:  Hyperkalemia [E87.5] Diabetic ketoacidosis (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) 03/18/2018   H/O medication noncompliance 02/07/2018   PAF (paroxysmal atrial fibrillation) (  [place]) 01/30/2018   Seizure ([place])    Hypertension associated with diabetes ([place]) 05/05/2016   PCP:  [name], NP Pharmacy:   [id number] [id number]: [phone]: [phone] [address] [place] [address] [address]: [phone]: [address]Phone: [phone]: [phone]    Social Determinants of Health (SDOH) Interventions    Readmission Risk Interventions    06/04/2022    8:58 AM  Readmission Risk Prevention Plan  Transportation Screening Complete  PCP or Specialist Appt within 5-7 Days Complete  Home Care Screening Complete  Medication Review (RN CM) Complete

## 2022-06-04 NOTE — Discharge Summary (Signed)
Physician Discharge Summary  Michaela Johnson PPI:[id number] DOB: [birth year] DOA: 06/01/2022  PCP: [name], NP  Admit date: 06/01/2022 Discharge date: 06/04/2022  Admitted From: home  Disposition:  Pt left AMA  Recommendations for Outpatient Follow-up:  Pt left AMA   Home Health: no  Equipment/Devices:  Discharge Condition: guarded CODE STATUS: full  Diet recommendation: Pt left AMA   Brief/Interim Summary: HPI was taken from NP [name]: This is a 61 yo female who presented to [facility] ER via EMS on 11/12 with unresponsiveness.  She was found by her neighbor on the floor unresponsive with last known well time 20 hrs before being found.  Per ER notes EMS reported the pts CBG was >500; pt bradycardic hr in the 40's; and hypotensive sbp 70's.    ED Course Upon arrival to the ER pt remained altered.  ER vital signs:  bp 69/37/hr 50/resp rate 15/O2 sats 98%/ temp 88.7 F.  Pt received 2 mg of atropine; 40 mg of iv lasix; 2 g of calcium gluconate; vancomycin; cefepime; and 4L IV fluid bolus.  Lab results ruled pt in for DKA insulin gtt.  Levophed gtt initiated due to continued hypotension.  PCCM team contacted for ICU admission.   Lab results: Na+ 121/K+ 7.3/chloride 87/CO2 <7/glucose 967/BUN 61/creatinine 2.21/alk phos 234/troponin 18/lactic acid 2.4/wbc 20.6./PT 20.2/INR 0.6/TKZSWFU level <10/salicylate level <9.3/ATFTD drug positive for cocaine/ UA negative for UTI but glucose >=500 and ketones present     DG Wrist Complete Right:  Un-healed distal radius and ulna fractures since June. Comminution and dorsal impaction not significantly changed. No new osseous abnormality identified. CT Cervical Spine/Head: . No evidence of significant acute traumatic injury to the skull, brain or cervical spine. Mild cerebral atrophy with extensive chronic microvascular ischemic changes in the cerebral white matter, as above. Mild multilevel degenerative disc disease and cervical spondylosis, as above. CT  Chest/Abd/Pelvis: . No definitive findings to suggest significant acute traumatic injury to the chest, abdomen or pelvis on today's noncontrast CT examination which is limited by considerable patient respiratory motion. There is a small volume of ascites, which is nonspecific. Severe diffuse low attenuation throughout the hepatic parenchyma, indicative of a background of hepatic steatosis. The appearance of the lungs is concerning for developing interstitial lung disease. Outpatient referral to Pulmonology for further clinical evaluation is recommended. Follow-up nonemergent high-resolution chest CT should also be considered in 6-12 months to assess for temporal changes in the appearance of the lung parenchyma. Aortic atherosclerosis, in addition to left main and 2 vessel coronary artery disease. Please note that although the presence of coronary artery calcium documents the presence of coronary artery disease, the severity of this disease and any potential stenosis cannot be assessed on this non-gated CT examination. Assessment for potential risk factor modification, dietary therapy or pharmacologic therapy may be warranted, if clinically indicated.  11/12: PT admitted to ICU with acute metabolic toxic encephalopathy; hypotension secondary to hypovolemic and possible septic shock; and DKA requiring insulin gtt and levophed gtt    As per Dr. Si Raider: Admitted to the ICU on 11/12 after presenting via EMS after being found down and unresponsive by a neighbor. On EMS arrival glucose >500, bradycardic, and hypotensive. On arrival found to be in DKA with hyperkalemia. Cocaine positive. Encephalopathic. Treated with IV insulin and pressor support (levopheg gtt). Also empiric abx (vanc/cefepime x1 then zosyn). Transferred to hospitalist service on 11/14.   As per Dr. Jimmye Norman 06/04/22: Pt's DKA had resolved but pt was still on sq insulin for  poorly controlled DM2. Pt was on abxs for possible CAP. In the evening,  pt was adamant about leaving the hospital, so pt left AMA.   Discharge Diagnoses:  Principal Problem:   Diabetic ketoacidosis ([place]) Active Problems:   PAF (paroxysmal atrial fibrillation) ([place])   Diabetes ([place])   Hypertension associated with diabetes ([place])   Chronic alcohol use   Nicotine dependence   Seizure ([place])   Substance abuse ([place])   History of GI bleed   Cirrhosis of liver ([place])   Cocaine use    DM2: poorly controlled, HbA1c 11.7%. Continue on glargine, SSI w/ accuchecks    DKA: resolved    SIRS: see Dr. Marcia Brash note on how pt met SIRS criteria   Possible CAP: initially hypotensive, hypoxic. Procal elevated. Possible pna on poor-quality CT. CXR shows b/l opacities. Continue on augmentin    Bacteriuria: urine culture growing e coli though patient asymptomatic.   Acute toxic metabolic encephalopathy: likely multifactorial, largely secondary to DKA and cocaine intoxication. CT head on 11/12 shows no acute intracranial abnormalities    A-fib: likely PAF. Continue on xarelto, diltiazem    HTN: on low end of normal. Off of pressors. Holding anti-HTN meds    Polysubstance abuse: cocaine positive on admission & hx heavy drinking as well. Continue on CIWA protocol. Polysubstance cessation counseling    Generalized weakness: PT/OT consulted   Hypokalemia: WNL today   Hypophosphatemia: WNL today    Non-union of distal radius fracture: as per XR. Continue w/ splint and f/u in ortho surg office in 1-2 weeks, Dr. Roland Rack   Hx GI bleed: H&H are WNL.    Cirrhosis: appears compensated    Discharge Instructions  Pt left AMA    Allergies  Allergen Reactions   Glipizide     Other reaction(s): Other (See Comments)   Metformin Diarrhea   Codeine Itching   Levaquin [Levofloxacin In D5w] Itching    Consultations: ICU    Procedures/Studies: DG Chest Port 1 View  Result Date: 06/03/2022 CLINICAL DATA:  CAP (community acquired pneumonia) EXAM: PORTABLE CHEST 1 VIEW  COMPARISON:  Chest x-ray June 01, 2021. FINDINGS: Similar versus slightly improved mild bilateral interstitial opacities. No confluent consolidation. No visible pleural effusions or pneumothorax. No evidence of acute osseous abnormality. IMPRESSION: Similar versus slightly improved mild bilateral interstitial opacities. Electronically Signed   By: Margaretha Sheffield M.D.   On: 06/03/2022 09:22   CT CHEST ABDOMEN PELVIS WO CONTRAST  Result Date: 06/01/2022 CLINICAL DATA:  61 year old female with history of blunt trauma. EXAM: CT CHEST, ABDOMEN AND PELVIS WITHOUT CONTRAST TECHNIQUE: Multidetector CT imaging of the chest, abdomen and pelvis was performed following the standard protocol without IV contrast. RADIATION DOSE REDUCTION: This exam was performed according to the departmental dose-optimization program which includes automated exposure control, adjustment of the mA and/or kV according to patient size and/or use of iterative reconstruction technique. COMPARISON:  CT of the chest, abdomen and pelvis 04/18/2021. FINDINGS: Comment: Portions of today's examination are severely limited by considerable patient respiratory motion. CT CHEST FINDINGS Cardiovascular: Heart size is normal. There is no significant pericardial fluid, thickening or pericardial calcification. There is aortic atherosclerosis, as well as atherosclerosis of the great vessels of the mediastinum and the coronary arteries, including calcified atherosclerotic plaque in the left main, left anterior descending and right coronary arteries. Mediastinum/Nodes: No pathologically enlarged mediastinal or hilar lymph nodes. Please note that accurate exclusion of hilar adenopathy is limited on noncontrast CT scans. Esophagus is unremarkable in appearance. No  axillary lymphadenopathy. Lungs/Pleura: Evaluation of the lungs is limited by considerable patient respiratory motion. With these limitations in mind, there is widespread areas of ground-glass  attenuation, septal thickening and some subpleural reticulation, most evident in the mid to lower lungs bilaterally, new compared to the prior study. No confluent consolidative airspace disease. No pleural effusions. No definite suspicious appearing pulmonary nodules or masses are noted. No pneumothorax. Musculoskeletal: Old fracture of the lateral aspect of the left clavicle with chronic posttraumatic deformity and probable fibrous union, similar to the remote prior examination. No definite acute displaced fractures or aggressive appearing lytic or blastic lesions are noted in the visualized portions of the skeleton on today's examination which is limited by considerable patient respiratory motion. CT ABDOMEN PELVIS FINDINGS Hepatobiliary: Diffuse low attenuation throughout the hepatic parenchyma, indicative of a background of hepatic steatosis. No discrete cystic or solid hepatic lesions are confidently identified on today's noncontrast CT examination. Gallbladder is nearly completely decompressed, but otherwise unremarkable in appearance. Pancreas: No definite pancreatic mass or peripancreatic fluid collections or inflammatory changes confidently identified on today's noncontrast CT examination. Spleen: Unremarkable. Adrenals/Urinary Tract: Unenhanced appearance of the kidneys and bilateral adrenal glands is unremarkable. No hydroureteronephrosis. Urinary bladder is completely decompressed around an indwelling Foley balloon catheter. Small amount of gas non dependently in the urinary bladder is presumably iatrogenic. Stomach/Bowel: Unenhanced appearance of the stomach is unremarkable. No pathologic dilatation of small bowel or colon. The appendix is not confidently identified and may be surgically absent. Regardless, there are no inflammatory changes noted adjacent to the cecum to suggest the presence of an acute appendicitis at this time. Vascular/Lymphatic: Atherosclerotic calcifications throughout the  abdominal aorta and pelvic vasculature. Right femoral central venous catheter with tip terminating in the right common iliac vein. Reproductive: Uterus and ovaries are atrophic. Other: Small volume of ascites.  No pneumoperitoneum. Musculoskeletal: Postoperative changes of ORIF are noted in the right proximal femur. Well-defined sclerotic lesion with narrow zone of transition in the right ilium (axial image 99 of series 5) measuring 1.5 x 1.1 cm, similar to the prior study, presumably a large bone island. There are no aggressive appearing lytic or blastic lesions noted in the visualized portions of the skeleton. IMPRESSION: 1. No definitive findings to suggest significant acute traumatic injury to the chest, abdomen or pelvis on today's noncontrast CT examination which is limited by considerable patient respiratory motion. 2. There is a small volume of ascites, which is nonspecific. 3. Severe diffuse low attenuation throughout the hepatic parenchyma, indicative of a background of hepatic steatosis. 4. The appearance of the lungs is concerning for developing interstitial lung disease. Outpatient referral to Pulmonology for further clinical evaluation is recommended. Follow-up nonemergent high-resolution chest CT should also be considered in 6-12 months to assess for temporal changes in the appearance of the lung parenchyma. 5. Aortic atherosclerosis, in addition to left main and 2 vessel coronary artery disease. Please note that although the presence of coronary artery calcium documents the presence of coronary artery disease, the severity of this disease and any potential stenosis cannot be assessed on this non-gated CT examination. Assessment for potential risk factor modification, dietary therapy or pharmacologic therapy may be warranted, if clinically indicated. 6. Additional incidental findings, as above. Electronically Signed   By: Vinnie Langton M.D.   On: 06/01/2022 06:53   CT Head Wo Contrast  Result  Date: 06/01/2022 CLINICAL DATA:  61 year old female with history of blunt trauma. EXAM: CT HEAD WITHOUT CONTRAST CT CERVICAL SPINE WITHOUT CONTRAST TECHNIQUE:  Multidetector CT imaging of the head and cervical spine was performed following the standard protocol without intravenous contrast. Multiplanar CT image reconstructions of the cervical spine were also generated. RADIATION DOSE REDUCTION: This exam was performed according to the departmental dose-optimization program which includes automated exposure control, adjustment of the mA and/or kV according to patient size and/or use of iterative reconstruction technique. COMPARISON:  No priors. FINDINGS: CT HEAD FINDINGS Brain: Mild cerebral atrophy. Patchy and confluent areas of decreased attenuation are noted throughout the deep and periventricular white matter of the cerebral hemispheres bilaterally, compatible with chronic microvascular ischemic disease. No evidence of acute infarction, hemorrhage, hydrocephalus, extra-axial collection or mass lesion/mass effect. Vascular: No hyperdense vessel or unexpected calcification. Skull: Normal. Negative for fracture or focal lesion. Sinuses/Orbits: No acute finding. Other: None. CT CERVICAL SPINE FINDINGS Alignment: Normal. Skull base and vertebrae: No acute fracture. No primary bone lesion or focal pathologic process. Soft tissues and spinal canal: No prevertebral fluid or swelling. No visible canal hematoma. Disc levels: Multilevel degenerative disc disease, most pronounced at C5-C6 and C6-C7. Moderate multilevel facet arthropathy bilaterally. Upper chest: Please see separate dictation for contemporaneously obtained CT chest, abdomen and pelvis. Other: None. IMPRESSION: 1. No evidence of significant acute traumatic injury to the skull, brain or cervical spine. 2. Mild cerebral atrophy with extensive chronic microvascular ischemic changes in the cerebral white matter, as above. 3. Mild multilevel degenerative disc  disease and cervical spondylosis, as above. Electronically Signed   By: Vinnie Langton M.D.   On: 06/01/2022 06:45   CT Cervical Spine Wo Contrast  Result Date: 06/01/2022 CLINICAL DATA:  61 year old female with history of blunt trauma. EXAM: CT HEAD WITHOUT CONTRAST CT CERVICAL SPINE WITHOUT CONTRAST TECHNIQUE: Multidetector CT imaging of the head and cervical spine was performed following the standard protocol without intravenous contrast. Multiplanar CT image reconstructions of the cervical spine were also generated. RADIATION DOSE REDUCTION: This exam was performed according to the departmental dose-optimization program which includes automated exposure control, adjustment of the mA and/or kV according to patient size and/or use of iterative reconstruction technique. COMPARISON:  No priors. FINDINGS: CT HEAD FINDINGS Brain: Mild cerebral atrophy. Patchy and confluent areas of decreased attenuation are noted throughout the deep and periventricular white matter of the cerebral hemispheres bilaterally, compatible with chronic microvascular ischemic disease. No evidence of acute infarction, hemorrhage, hydrocephalus, extra-axial collection or mass lesion/mass effect. Vascular: No hyperdense vessel or unexpected calcification. Skull: Normal. Negative for fracture or focal lesion. Sinuses/Orbits: No acute finding. Other: None. CT CERVICAL SPINE FINDINGS Alignment: Normal. Skull base and vertebrae: No acute fracture. No primary bone lesion or focal pathologic process. Soft tissues and spinal canal: No prevertebral fluid or swelling. No visible canal hematoma. Disc levels: Multilevel degenerative disc disease, most pronounced at C5-C6 and C6-C7. Moderate multilevel facet arthropathy bilaterally. Upper chest: Please see separate dictation for contemporaneously obtained CT chest, abdomen and pelvis. Other: None. IMPRESSION: 1. No evidence of significant acute traumatic injury to the skull, brain or cervical spine.  2. Mild cerebral atrophy with extensive chronic microvascular ischemic changes in the cerebral white matter, as above. 3. Mild multilevel degenerative disc disease and cervical spondylosis, as above. Electronically Signed   By: Vinnie Langton M.D.   On: 06/01/2022 06:45   DG Wrist Complete Right  Result Date: 06/01/2022 CLINICAL DATA:  61 year old female found down by neighbor. Last known well 20 hours ago. Hyperglycemia. EXAM: RIGHT WRIST - COMPLETE 3+ VIEW COMPARISON:  Right wrist series 06/15 - 01/07/2022. FINDINGS:  Comminuted distal radius and ulna fractures in June. Distal radius and ulna deformities today appear to be the un healed injury from June. Mild displacement and dorsal angulation. Carpal bone alignment maintained. Metacarpals appear stable and intact. Incidental thenar area intravenous access. IMPRESSION: 1. Un-healed distal radius and ulna fractures since June. Comminution and dorsal impaction not significantly changed. 2. No new osseous abnormality identified. Electronically Signed   By: Genevie Ann M.D.   On: 06/01/2022 06:27   DG Abd Portable 1 View  Result Date: 06/01/2022 CLINICAL DATA:  61 year old female found down by neighbor. Last known well 20 hours ago. Hyperglycemia. EXAM: PORTABLE ABDOMEN - 1 VIEW COMPARISON:  CT Abdomen and Pelvis 04/18/2021. Abdominal and pelvis radiographs 06/07/2016. Intraoperative right hip images [id number]. FINDINGS: Partially visible right femur ORIF hardware, visualized portion stable. Femoral heads normally located. Pelvis appears stable and intact. Grossly intact proximal left femur. Midline pelvic catheter in place, likely a Foley. Right femoral vascular catheter in place, tip projects over the sacral ala. Negative visible bowel gas pattern. Visible lumbar spine appears stable. IMPRESSION: 1. Right femoral vascular catheter and midline probable Foley catheter in place. 2.  No acute fracture or dislocation identified about the pelvis. 3.  Non obstructed  bowel gas pattern. Electronically Signed   By: Genevie Ann M.D.   On: 06/01/2022 06:25   DG Chest Port 1 View  Result Date: 06/01/2022 CLINICAL DATA:  61 year old female found down by neighbor. Last known well 20 hours ago. Hyperglycemia. EXAM: PORTABLE CHEST 1 VIEW COMPARISON:  Portable chest 01/02/2022 and earlier. FINDINGS: Portable AP supine view at 0553 hours. Mildly lower lung volumes. Mediastinal contours remain within normal limits. Pacer or resuscitation pads project over the chest and abdomen. Mild diffuse increased pulmonary interstitial opacity. No pneumothorax, pleural effusion or consolidation identified on this supine view. Chronic left clavicle fracture is stable. No acute osseous abnormality identified. Negative visible bowel gas. IMPRESSION: 1. Nonspecific increased pulmonary interstitial opacity. Consider mild or developing pulmonary edema, atelectasis, viral/atypical respiratory infection. 2. Chronic left clavicle fracture. No acute traumatic injury identified. Electronically Signed   By: Genevie Ann M.D.   On: 06/01/2022 06:23   (Echo, Carotid, EGD, Colonoscopy, ERCP)    Subjective:   Discharge Exam: Vitals:   06/04/22 0801 06/04/22 1132  BP: 136/79 107/63  Pulse: 97 82  Resp: 18 18  Temp: 98.4 F (36.9 C) 98 F (36.7 C)  SpO2: 97% 96%   Vitals:   06/03/22 2340 06/04/22 0635 06/04/22 0801 06/04/22 1132  BP: (!) 105/53 111/64 136/79 107/63  Pulse: 90 84 97 82  Resp: _0 Temp: (!) 100.8 F (38.2 C) 99.6 F (37.6 C) 98.4 F (36.9 C) 98 F (36.7 C)  TempSrc:  Oral Oral Oral  SpO2: 95% 93% 97% 96%  Weight:      Height:        Pt left AMA.     The results of significant diagnostics from this hospitalization (including imaging, microbiology, ancillary and laboratory) are listed below for reference.     Microbiology: Recent Results (from the past 240 hour(s))  Blood Culture (routine x 2)     Status: None (Preliminary result)   Collection Time: 06/01/22   4:24 AM   Specimen: BLOOD RIGHT WRIST  Result Value Ref Range Status   Specimen Description BLOOD RIGHT WRIST  Final   Special Requests   Final    BOTTLES DRAWN AEROBIC AND ANAEROBIC Blood Culture results may not be optimal due  to an inadequate volume of blood received in culture bottles   Culture   Final    NO GROWTH 3 DAYS Performed at [facility], [address] [place], [place] [address]    Report Status PENDING  Incomplete  Blood Culture (routine x 2)     Status: None (Preliminary result)   Collection Time: 06/01/22  4:24 AM   Specimen: Left Antecubital; Blood  Result Value Ref Range Status   Specimen Description LEFT ANTECUBITAL  Final   Special Requests   Final    BOTTLES DRAWN AEROBIC AND ANAEROBIC Blood Culture adequate volume   Culture   Final    NO GROWTH 3 DAYS Performed at [facility], [address] [place], [place] [address]    Report Status PENDING  Incomplete  Urine Culture     Status: Abnormal   Collection Time: 06/01/22  4:24 AM   Specimen: In/Out Cath Urine  Result Value Ref Range Status   Specimen Description   Final    IN/OUT CATH URINE Performed at [facility], [address], [place], [place] [address]    Special Requests   Final    NONE Performed at [facility], [address] [place],  [address]    Culture >=100,000 COLONIES/mL ESCHERICHIA COLI (A)  Final   Report Status 06/03/2022 FINAL  Final   Organism ID, Bacteria ESCHERICHIA COLI (A)  Final      Susceptibility   Escherichia coli - MIC*    AMPICILLIN 4 SENSITIVE Sensitive     CEFAZOLIN <=4 SENSITIVE Sensitive     CEFEPIME <=0.12 SENSITIVE Sensitive     CEFTRIAXONE <=0.25 SENSITIVE Sensitive     CIPROFLOXACIN <=0.25 SENSITIVE Sensitive     GENTAMICIN <=1 SENSITIVE Sensitive     IMIPENEM <=0.25 SENSITIVE Sensitive     NITROFURANTOIN <=16 SENSITIVE Sensitive     TRIMETH/SULFA <=20 SENSITIVE Sensitive     AMPICILLIN/SULBACTAM <=2 SENSITIVE  Sensitive     PIP/TAZO <=4 SENSITIVE Sensitive     * >=100,000 COLONIES/mL ESCHERICHIA COLI  SARS Coronavirus 2 by RT PCR (hospital order, performed in [place] hospital lab) *cepheid single result test* Anterior Nasal Swab     Status: None   Collection Time: 06/01/22  7:34 AM   Specimen: Anterior Nasal Swab  Result Value Ref Range Status   SARS Coronavirus 2 by RT PCR NEGATIVE NEGATIVE Final    Comment: (NOTE) SARS-CoV-2 target nucleic acids are NOT DETECTED.  The SARS-CoV-2 RNA is generally detectable in upper and lower respiratory specimens during the acute phase of infection. The lowest concentration of SARS-CoV-2 viral copies this assay can detect is 250 copies / mL. A negative result does not preclude SARS-CoV-2 infection and should not be used as the sole basis for treatment or other patient management decisions.  A negative result may occur with improper specimen collection / handling, submission of specimen other than nasopharyngeal swab, presence of viral mutation(s) within the areas targeted by this assay, and inadequate number of viral copies (<250 copies / mL). A negative result must be combined with clinical observations, patient history, and epidemiological information.  Fact Sheet for Patients:   https://www.patel.info/  Fact Sheet for Healthcare Providers: https://hall.com/  This test is not yet approved or  cleared by the [place] FDA and has been authorized for detection and/or diagnosis of SARS-CoV-2 by FDA under an Emergency Use Authorization (EUA).  This EUA will remain in effect (meaning this test can be used) for the  duration of the COVID-19 declaration under Section 564(b)(1) of the Act, 21 U.S.C. section 360bbb-3(b)(1), unless the authorization is terminated or revoked sooner.  Performed at [facility], [address] [place], [place] [address]   MRSA Next Gen by PCR, Nasal     Status: None    Collection Time: 06/01/22  8:42 AM   Specimen: Nasal Mucosa; Nasal Swab  Result Value Ref Range Status   MRSA by PCR Next Gen NOT DETECTED NOT DETECTED Final    Comment: (NOTE) The GeneXpert MRSA Assay (FDA approved for NASAL specimens only), is one component of a comprehensive MRSA colonization surveillance program. It is not intended to diagnose MRSA infection nor to guide or monitor treatment for MRSA infections. Test performance is not FDA approved in patients less than 35 years old. Performed at [facility], [address] [place], [place] [address]      Labs: BNP (last 3 results) Recent Labs    06/01/22 0904  BNP 440.1*   Basic Metabolic Panel: Recent Labs  Lab 06/01/22 1012 06/01/22 1330 06/01/22 1803 06/01/22 2211 06/02/22 0317 06/03/22 0417 06/04/22 0407  NA  --    < > 137 140 142 145 138  K  --    < > 4.1 3.9 3.6 3.1* 3.6  CL  --    < > 108 109 113* 110 104  CO2  --    < > 21* 24 21* 25 29  GLUCOSE  --    < > 281* 326* 316* 100* 194*  BUN  --    < > 45* 37* 32* 18 23  CREATININE  --    < > 1.23* 0.98 0.87 0.54 0.59  CALCIUM  --    < > 9.4 9.0 8.7* 9.3 9.2  MG 2.4  --   --   --  2.1 2.2  --   PHOS 2.3*  --   --   --  3.3 1.7* 3.5   < > = values in this interval not displayed.   Liver Function Tests: Recent Labs  Lab 06/01/22 0424  AST 29  ALT 18  ALKPHOS 234*  BILITOT 2.5*  PROT 8.4*  ALBUMIN 4.0   No results for input(s): "LIPASE", "AMYLASE" in the last 168 hours. No results for input(s): "AMMONIA" in the last 168 hours. CBC: Recent Labs  Lab 06/01/22 0424 06/01/22 0904 06/02/22 0317 06/03/22 0417 06/04/22 0407  WBC 20.6* 20.3* 13.7* 10.4 8.3  NEUTROABS 17.4*  --   --   --   --   HGB 13.4 12.0 11.7* 11.9* 12.6  HCT 45.5 35.9* 33.2* 34.7* 36.6  MCV 113.5* 101.4* 94.6 96.7 96.6  PLT 385 309 248 214 182   Cardiac Enzymes: Recent Labs  Lab 06/01/22 0424  CKTOTAL 206   BNP: Invalid input(s): "POCBNP" CBG: Recent Labs   Lab 06/03/22 1504 06/03/22 2145 06/04/22 0806 06/04/22 1150 06/04/22 1610  GLUCAP 238* 214* 241* 393* 250*   D-Dimer No results for input(s): "DDIMER" in the last 72 hours. Hgb A1c Recent Labs    06/02/22 0317  HGBA1C 11.7*   Lipid Profile No results for input(s): "CHOL", "HDL", "LDLCALC", "TRIG", "CHOLHDL", "LDLDIRECT" in the last 72 hours. Thyroid function studies No results for input(s): "TSH", "T4TOTAL", "T3FREE", "THYROIDAB" in the last 72 hours.  Invalid input(s): "FREET3" Anemia work up No results for input(s): "VITAMINB12", "FOLATE", "FERRITIN", "TIBC", "IRON", "RETICCTPCT" in the last 72 hours. Urinalysis    Component Value Date/Time   COLORURINE  YELLOW (A) 06/01/2022 0424   APPEARANCEUR HAZY (A) 06/01/2022 0424   LABSPEC 1.020 06/01/2022 0424   PHURINE 5.0 06/01/2022 0424   GLUCOSEU >=500 (A) 06/01/2022 0424   HGBUR NEGATIVE 06/01/2022 0424   BILIRUBINUR NEGATIVE 06/01/2022 0424   KETONESUR 20 (A) 06/01/2022 0424   PROTEINUR 30 (A) 06/01/2022 0424   NITRITE NEGATIVE 06/01/2022 0424   LEUKOCYTESUR NEGATIVE 06/01/2022 0424   Sepsis Labs Recent Labs  Lab 06/01/22 0904 06/02/22 0317 06/03/22 0417 06/04/22 0407  WBC 20.3* 13.7* 10.4 8.3   Microbiology Recent Results (from the past 240 hour(s))  Blood Culture (routine x 2)     Status: None (Preliminary result)   Collection Time: 06/01/22  4:24 AM   Specimen: BLOOD RIGHT WRIST  Result Value Ref Range Status   Specimen Description BLOOD RIGHT WRIST  Final   Special Requests   Final    BOTTLES DRAWN AEROBIC AND ANAEROBIC Blood Culture results may not be optimal due to an inadequate volume of blood received in culture bottles   Culture   Final    NO GROWTH 3 DAYS Performed at [facility], [address], [place], [place] [address]    Report Status PENDING  Incomplete  Blood Culture (routine x 2)     Status: None (Preliminary result)   Collection Time: 06/01/22  4:24 AM   Specimen: Left  Antecubital; Blood  Result Value Ref Range Status   Specimen Description LEFT ANTECUBITAL  Final   Special Requests   Final    BOTTLES DRAWN AEROBIC AND ANAEROBIC Blood Culture adequate volume   Culture   Final    NO GROWTH 3 DAYS Performed at [facility], [address] [place], [place] [address]    Report Status PENDING  Incomplete  Urine Culture     Status: Abnormal   Collection Time: 06/01/22  4:24 AM   Specimen: In/Out Cath Urine  Result Value Ref Range Status   Specimen Description   Final    IN/OUT CATH URINE Performed at [facility], [address] [place], [place] [address]    Special Requests   Final    NONE Performed at [facility], [address] [place], [place] [address]    Culture >=100,000 COLONIES/mL ESCHERICHIA COLI (A)  Final   Report Status 06/03/2022 FINAL  Final   Organism ID, Bacteria ESCHERICHIA COLI (A)  Final      Susceptibility   Escherichia coli - MIC*    AMPICILLIN 4 SENSITIVE Sensitive     CEFAZOLIN <=4 SENSITIVE Sensitive     CEFEPIME <=0.12 SENSITIVE Sensitive     CEFTRIAXONE <=0.25 SENSITIVE Sensitive     CIPROFLOXACIN <=0.25 SENSITIVE Sensitive     GENTAMICIN <=1 SENSITIVE Sensitive     IMIPENEM <=0.25 SENSITIVE Sensitive     NITROFURANTOIN <=16 SENSITIVE Sensitive     TRIMETH/SULFA <=20 SENSITIVE Sensitive     AMPICILLIN/SULBACTAM <=2 SENSITIVE Sensitive     PIP/TAZO <=4 SENSITIVE Sensitive     * >=100,000 COLONIES/mL ESCHERICHIA COLI  SARS Coronavirus 2 by RT PCR (hospital order, performed in [facility] lab) *cepheid single result test* Anterior Nasal Swab     Status: None   Collection Time: 06/01/22  7:34 AM   Specimen: Anterior Nasal Swab  Result Value Ref Range Status   SARS Coronavirus 2 by RT PCR NEGATIVE NEGATIVE Final    Comment: (NOTE) SARS-CoV-2 target nucleic acids are NOT DETECTED.  The SARS-CoV-2 RNA is generally detectable in upper and  lower respiratory specimens during the  acute phase of infection. The lowest concentration of SARS-CoV-2 viral copies this assay can detect is 250 copies / mL. A negative result does not preclude SARS-CoV-2 infection and should not be used as the sole basis for treatment or other patient management decisions.  A negative result may occur with improper specimen collection / handling, submission of specimen other than nasopharyngeal swab, presence of viral mutation(s) within the areas targeted by this assay, and inadequate number of viral copies (<250 copies / mL). A negative result must be combined with clinical observations, patient history, and epidemiological information.  Fact Sheet for Patients:   https://www.patel.info/  Fact Sheet for Healthcare Providers: https://hall.com/  This test is not yet approved or  cleared by the [place] FDA and has been authorized for detection and/or diagnosis of SARS-CoV-2 by FDA under an Emergency Use Authorization (EUA).  This EUA will remain in effect (meaning this test can be used) for the duration of the COVID-19 declaration under Section 564(b)(1) of the Act, 21 U.S.C. section 360bbb-3(b)(1), unless the authorization is terminated or revoked sooner.  Performed at [facility], [address] [place], [place] [address]   MRSA Next Gen by PCR, Nasal     Status: None   Collection Time: 06/01/22  8:42 AM   Specimen: Nasal Mucosa; Nasal Swab  Result Value Ref Range Status   MRSA by PCR Next Gen NOT DETECTED NOT DETECTED Final    Comment: (NOTE) The GeneXpert MRSA Assay (FDA approved for NASAL specimens only), is one component of a comprehensive MRSA colonization surveillance program. It is not intended to diagnose MRSA infection nor to guide or monitor treatment for MRSA infections. Test performance is not FDA approved in patients less than 19 years old. Performed at [facility], [address],  [place], [place] [address]      Time coordinating discharge: Over 30 minutes  SIGNED:   Wyvonnia Dusky, MD  [facility] 06/04/2022, 5:17 PM Pager   If 7PM-7AM, please contact night-coverage

## 2022-06-05 LAB — HCV INTERPRETATION

## 2022-06-05 LAB — HCV AB W REFLEX TO QUANT PCR: HCV Ab: NONREACTIVE

## 2022-06-06 LAB — CULTURE, BLOOD (ROUTINE X 2)
Culture: NO GROWTH
Culture: NO GROWTH
Special Requests: ADEQUATE

## 2022-06-14 ENCOUNTER — Encounter: Payer: Self-pay | Admitting: Emergency Medicine

## 2022-06-14 ENCOUNTER — Other Ambulatory Visit: Payer: Self-pay

## 2022-06-14 ENCOUNTER — Emergency Department: Payer: Self-pay

## 2022-06-14 ENCOUNTER — Inpatient Hospital Stay
Admission: EM | Admit: 2022-06-14 | Discharge: 2022-06-17 | DRG: 637 | Disposition: A | Discharge status: Home / Self Care | Payer: Self-pay | Attending: Hospitalist | Admitting: Hospitalist

## 2022-06-14 ENCOUNTER — Inpatient Hospital Stay: Payer: Self-pay

## 2022-06-14 DIAGNOSIS — Z5309 Procedure and treatment not carried out because of other contraindication: Secondary | ICD-10-CM | POA: Diagnosis not present

## 2022-06-14 DIAGNOSIS — Z794 Long term (current) use of insulin: Secondary | ICD-10-CM

## 2022-06-14 DIAGNOSIS — E1165 Type 2 diabetes mellitus with hyperglycemia: Secondary | ICD-10-CM

## 2022-06-14 DIAGNOSIS — E872 Acidosis, unspecified: Secondary | ICD-10-CM

## 2022-06-14 DIAGNOSIS — T405X1A Poisoning by cocaine, accidental (unintentional), initial encounter: Secondary | ICD-10-CM | POA: Diagnosis present

## 2022-06-14 DIAGNOSIS — K219 Gastro-esophageal reflux disease without esophagitis: Secondary | ICD-10-CM | POA: Diagnosis present

## 2022-06-14 DIAGNOSIS — E1140 Type 2 diabetes mellitus with diabetic neuropathy, unspecified: Secondary | ICD-10-CM | POA: Diagnosis present

## 2022-06-14 DIAGNOSIS — R578 Other shock: Secondary | ICD-10-CM | POA: Diagnosis present

## 2022-06-14 DIAGNOSIS — J9601 Acute respiratory failure with hypoxia: Secondary | ICD-10-CM | POA: Diagnosis present

## 2022-06-14 DIAGNOSIS — R680 Hypothermia, not associated with low environmental temperature: Secondary | ICD-10-CM | POA: Diagnosis present

## 2022-06-14 DIAGNOSIS — E875 Hyperkalemia: Secondary | ICD-10-CM | POA: Diagnosis present

## 2022-06-14 DIAGNOSIS — E861 Hypovolemia: Secondary | ICD-10-CM | POA: Diagnosis present

## 2022-06-14 DIAGNOSIS — Z833 Family history of diabetes mellitus: Secondary | ICD-10-CM

## 2022-06-14 DIAGNOSIS — Z79899 Other long term (current) drug therapy: Secondary | ICD-10-CM

## 2022-06-14 DIAGNOSIS — N179 Acute kidney failure, unspecified: Secondary | ICD-10-CM | POA: Diagnosis present

## 2022-06-14 DIAGNOSIS — E1010 Type 1 diabetes mellitus with ketoacidosis without coma: Principal | ICD-10-CM

## 2022-06-14 DIAGNOSIS — F410 Panic disorder [episodic paroxysmal anxiety] without agoraphobia: Secondary | ICD-10-CM | POA: Diagnosis present

## 2022-06-14 DIAGNOSIS — E860 Dehydration: Secondary | ICD-10-CM | POA: Diagnosis present

## 2022-06-14 DIAGNOSIS — Z7984 Long term (current) use of oral hypoglycemic drugs: Secondary | ICD-10-CM

## 2022-06-14 DIAGNOSIS — N170 Acute kidney failure with tubular necrosis: Secondary | ICD-10-CM | POA: Diagnosis present

## 2022-06-14 DIAGNOSIS — I480 Paroxysmal atrial fibrillation: Secondary | ICD-10-CM | POA: Diagnosis present

## 2022-06-14 DIAGNOSIS — F1410 Cocaine abuse, uncomplicated: Secondary | ICD-10-CM | POA: Diagnosis present

## 2022-06-14 DIAGNOSIS — F17210 Nicotine dependence, cigarettes, uncomplicated: Secondary | ICD-10-CM | POA: Diagnosis present

## 2022-06-14 DIAGNOSIS — E1110 Type 2 diabetes mellitus with ketoacidosis without coma: Principal | ICD-10-CM | POA: Diagnosis present

## 2022-06-14 DIAGNOSIS — F32A Depression, unspecified: Secondary | ICD-10-CM | POA: Diagnosis present

## 2022-06-14 DIAGNOSIS — Z885 Allergy status to narcotic agent status: Secondary | ICD-10-CM

## 2022-06-14 DIAGNOSIS — J22 Unspecified acute lower respiratory infection: Secondary | ICD-10-CM | POA: Diagnosis present

## 2022-06-14 DIAGNOSIS — F1010 Alcohol abuse, uncomplicated: Secondary | ICD-10-CM | POA: Diagnosis present

## 2022-06-14 DIAGNOSIS — Z881 Allergy status to other antibiotic agents status: Secondary | ICD-10-CM

## 2022-06-14 DIAGNOSIS — Z7901 Long term (current) use of anticoagulants: Secondary | ICD-10-CM

## 2022-06-14 DIAGNOSIS — G928 Other toxic encephalopathy: Secondary | ICD-10-CM | POA: Diagnosis present

## 2022-06-14 DIAGNOSIS — I10 Essential (primary) hypertension: Secondary | ICD-10-CM | POA: Diagnosis present

## 2022-06-14 DIAGNOSIS — J689 Unspecified respiratory condition due to chemicals, gases, fumes and vapors: Secondary | ICD-10-CM | POA: Diagnosis present

## 2022-06-14 DIAGNOSIS — Z888 Allergy status to other drugs, medicaments and biological substances status: Secondary | ICD-10-CM

## 2022-06-14 DIAGNOSIS — Z8249 Family history of ischemic heart disease and other diseases of the circulatory system: Secondary | ICD-10-CM

## 2022-06-14 DIAGNOSIS — R571 Hypovolemic shock: Secondary | ICD-10-CM | POA: Diagnosis present

## 2022-06-14 DIAGNOSIS — J95811 Postprocedural pneumothorax: Secondary | ICD-10-CM

## 2022-06-14 DIAGNOSIS — Z91199 Patient's noncompliance with other medical treatment and regimen due to unspecified reason: Secondary | ICD-10-CM

## 2022-06-14 DIAGNOSIS — E876 Hypokalemia: Secondary | ICD-10-CM | POA: Diagnosis not present

## 2022-06-14 LAB — GLUCOSE, CAPILLARY
Glucose-Capillary: 562 mg/dL (ref 70–99)
Glucose-Capillary: 571 mg/dL (ref 70–99)
Glucose-Capillary: 561 mg/dL (ref 70–99)
Glucose-Capillary: 530 mg/dL (ref 70–99)
Glucose-Capillary: 533 mg/dL (ref 70–99)
Glucose-Capillary: 494 mg/dL — ABNORMAL HIGH (ref 70–99)
Glucose-Capillary: 545 mg/dL (ref 70–99)
Glucose-Capillary: 450 mg/dL — ABNORMAL HIGH (ref 70–99)
Glucose-Capillary: 498 mg/dL — ABNORMAL HIGH (ref 70–99)
Glucose-Capillary: 445 mg/dL — ABNORMAL HIGH (ref 70–99)
Glucose-Capillary: 369 mg/dL — ABNORMAL HIGH (ref 70–99)
Glucose-Capillary: 326 mg/dL — ABNORMAL HIGH (ref 70–99)
Glucose-Capillary: 239 mg/dL — ABNORMAL HIGH (ref 70–99)
Glucose-Capillary: 221 mg/dL — ABNORMAL HIGH (ref 70–99)
Glucose-Capillary: 158 mg/dL — ABNORMAL HIGH (ref 70–99)
Glucose-Capillary: 160 mg/dL — ABNORMAL HIGH (ref 70–99)

## 2022-06-14 LAB — URINE DRUG SCREEN, QUALITATIVE (ARMC ONLY)
Amphetamines, Ur Screen: NOT DETECTED
Barbiturates, Ur Screen: NOT DETECTED
Benzodiazepine, Ur Scrn: NOT DETECTED
Cannabinoid 50 Ng, Ur ~~LOC~~: NOT DETECTED
Cocaine Metabolite,Ur ~~LOC~~: POSITIVE — AB
MDMA (Ecstasy)Ur Screen: NOT DETECTED
Methadone Scn, Ur: NOT DETECTED
Opiate, Ur Screen: NOT DETECTED
Phencyclidine (PCP) Ur S: NOT DETECTED
Tricyclic, Ur Screen: NOT DETECTED

## 2022-06-14 LAB — CBC
HCT: 36 % (ref 36.0–46.0)
Hemoglobin: 11.1 g/dL — ABNORMAL LOW (ref 12.0–15.0)
MCH: 33.6 pg (ref 26.0–34.0)
MCHC: 30.8 g/dL (ref 30.0–36.0)
MCV: 109.1 fL — ABNORMAL HIGH (ref 80.0–100.0)
Platelets: 489 10*3/uL — ABNORMAL HIGH (ref 150–400)
RBC: 3.3 MIL/uL — ABNORMAL LOW (ref 3.87–5.11)
RDW: 12.5 % (ref 11.5–15.5)
WBC: 20.6 10*3/uL — ABNORMAL HIGH (ref 4.0–10.5)
nRBC: 0 % (ref 0.0–0.2)

## 2022-06-14 LAB — SALICYLATE LEVEL: Salicylate Lvl: <7 mg/dL — ABNORMAL LOW (ref 7.0–30.0)

## 2022-06-14 LAB — BASIC METABOLIC PANEL
Anion gap: 13 (ref 5–15)
BUN: 42 mg/dL — ABNORMAL HIGH (ref 8–23)
BUN: 39 mg/dL — ABNORMAL HIGH (ref 8–23)
BUN: 39 mg/dL — ABNORMAL HIGH (ref 8–23)
BUN: 36 mg/dL — ABNORMAL HIGH (ref 8–23)
CO2: <7 mmol/L — ABNORMAL LOW (ref 22–32)
CO2: <7 mmol/L — ABNORMAL LOW (ref 22–32)
CO2: <7 mmol/L — ABNORMAL LOW (ref 22–32)
CO2: 20 mmol/L — ABNORMAL LOW (ref 22–32)
Calcium: 9.4 mg/dL (ref 8.9–10.3)
Calcium: 8.9 mg/dL (ref 8.9–10.3)
Calcium: 8.8 mg/dL — ABNORMAL LOW (ref 8.9–10.3)
Calcium: 8.3 mg/dL — ABNORMAL LOW (ref 8.9–10.3)
Chloride: 90 mmol/L — ABNORMAL LOW (ref 98–111)
Chloride: 100 mmol/L (ref 98–111)
Chloride: 101 mmol/L (ref 98–111)
Chloride: 99 mmol/L (ref 98–111)
Creatinine, Ser: 1.91 mg/dL — ABNORMAL HIGH (ref 0.44–1.00)
Creatinine, Ser: 1.57 mg/dL — ABNORMAL HIGH (ref 0.44–1.00)
Creatinine, Ser: 1.5 mg/dL — ABNORMAL HIGH (ref 0.44–1.00)
Creatinine, Ser: 1.22 mg/dL — ABNORMAL HIGH (ref 0.44–1.00)
GFR, Estimated: 29 mL/min — ABNORMAL LOW (ref >60)
GFR, Estimated: 37 mL/min — ABNORMAL LOW (ref >60)
GFR, Estimated: 39 mL/min — ABNORMAL LOW (ref >60)
GFR, Estimated: 50 mL/min — ABNORMAL LOW (ref >60)
Glucose, Bld: 754 mg/dL (ref 70–99)
Glucose, Bld: 633 mg/dL (ref 70–99)
Glucose, Bld: 579 mg/dL (ref 70–99)
Glucose, Bld: 235 mg/dL — ABNORMAL HIGH (ref 70–99)
Potassium: 6.2 mmol/L — ABNORMAL HIGH (ref 3.5–5.1)
Potassium: 4.7 mmol/L (ref 3.5–5.1)
Potassium: 4.4 mmol/L (ref 3.5–5.1)
Potassium: 3.7 mmol/L (ref 3.5–5.1)
Sodium: 125 mmol/L — ABNORMAL LOW (ref 135–145)
Sodium: 130 mmol/L — ABNORMAL LOW (ref 135–145)
Sodium: 131 mmol/L — ABNORMAL LOW (ref 135–145)
Sodium: 132 mmol/L — ABNORMAL LOW (ref 135–145)

## 2022-06-14 LAB — URINALYSIS, ROUTINE W REFLEX MICROSCOPIC
Bilirubin Urine: NEGATIVE
Glucose, UA: >=500 mg/dL — AB
Ketones, ur: 80 mg/dL — AB
Leukocytes,Ua: NEGATIVE
Nitrite: NEGATIVE
Protein, ur: 100 mg/dL — AB
RBC / HPF: >50 RBC/hpf — ABNORMAL HIGH (ref 0–5)
Specific Gravity, Urine: 1.017 (ref 1.005–1.030)
pH: 5 (ref 5.0–8.0)

## 2022-06-14 LAB — PROTIME-INR
INR: 1.4 — ABNORMAL HIGH (ref 0.8–1.2)
Prothrombin Time: 17.2 seconds — ABNORMAL HIGH (ref 11.4–15.2)

## 2022-06-14 LAB — ACETAMINOPHEN LEVEL: Acetaminophen (Tylenol), Serum: <10 ug/mL — ABNORMAL LOW (ref 10–30)

## 2022-06-14 LAB — RESPIRATORY PANEL BY PCR

## 2022-06-14 LAB — PROCALCITONIN: Procalcitonin: 4.87 ng/mL

## 2022-06-14 LAB — BLOOD GAS, ARTERIAL
Acid-base deficit: 29 mmol/L — ABNORMAL HIGH (ref 0.0–2.0)
Bicarbonate: 2.8 mmol/L — ABNORMAL LOW (ref 20.0–28.0)
O2 Content: 6 L/min
O2 Saturation: 98.4 %
Patient temperature: 37
pCO2 arterial: <18 mmHg — CL (ref 32–48)
pH, Arterial: <6.95 — CL (ref 7.35–7.45)
pO2, Arterial: 111 mmHg — ABNORMAL HIGH (ref 83–108)

## 2022-06-14 LAB — CBG MONITORING, ED
Glucose-Capillary: >600 mg/dL (ref 70–99)
Glucose-Capillary: 545 mg/dL (ref 70–99)

## 2022-06-14 LAB — LACTIC ACID, PLASMA
Lactic Acid, Venous: 2.7 mmol/L (ref 0.5–1.9)
Lactic Acid, Venous: 2.8 mmol/L (ref 0.5–1.9)
Lactic Acid, Venous: 2 mmol/L (ref 0.5–1.9)

## 2022-06-14 LAB — BRAIN NATRIURETIC PEPTIDE: B Natriuretic Peptide: 298.4 pg/mL — ABNORMAL HIGH (ref 0.0–100.0)

## 2022-06-14 LAB — TROPONIN I (HIGH SENSITIVITY): Troponin I (High Sensitivity): 18 ng/L — ABNORMAL HIGH (ref <18)

## 2022-06-14 LAB — BETA-HYDROXYBUTYRIC ACID
Beta-Hydroxybutyric Acid: >8 mmol/L — ABNORMAL HIGH (ref 0.05–0.27)
Beta-Hydroxybutyric Acid: >8 mmol/L — ABNORMAL HIGH (ref 0.05–0.27)

## 2022-06-14 LAB — MAGNESIUM: Magnesium: 2.2 mg/dL (ref 1.7–2.4)

## 2022-06-14 LAB — ETHANOL: Alcohol, Ethyl (B): <10 mg/dL (ref <10)

## 2022-06-14 LAB — APTT: aPTT: 23 seconds — ABNORMAL LOW (ref 24–36)

## 2022-06-14 MED ORDER — FAMOTIDINE IN NACL 20-0.9 MG/50ML-% IV SOLN
20.0000 mg | INTRAVENOUS | Status: DC
  Administered 2022-06-14: 20 mg via INTRAVENOUS
  Filled 2022-06-14: qty 50

## 2022-06-14 MED ORDER — STERILE WATER FOR INJECTION IV SOLN
INTRAVENOUS | Status: DC
  Filled 2022-06-14: qty 150
  Filled 2022-06-14: qty 1000
  Filled 2022-06-14: qty 150
  Filled 2022-06-14: qty 1000
  Filled 2022-06-14: qty 150
  Filled 2022-06-14 (×2): qty 1000
  Filled 2022-06-14: qty 150
  Filled 2022-06-14 (×2): qty 1000

## 2022-06-14 MED ORDER — SODIUM CHLORIDE 0.9 % IV BOLUS
1000.0000 mL | Freq: Once | INTRAVENOUS | Status: AC
  Administered 2022-06-14: 1000 mL via INTRAVENOUS

## 2022-06-14 MED ORDER — DEXTROSE IN LACTATED RINGERS 5 % IV SOLN
INTRAVENOUS | Status: DC
  Filled 2022-06-14: qty 1000

## 2022-06-14 MED ORDER — DEXTROSE 50 % IV SOLN: 0.0000 mL | INTRAVENOUS | Status: DC | PRN

## 2022-06-14 MED ORDER — SODIUM CHLORIDE 0.9 % IV SOLN
2.0000 g | Freq: Once | INTRAVENOUS | Status: AC
  Administered 2022-06-14: 2 g via INTRAVENOUS
  Filled 2022-06-14: qty 12.5

## 2022-06-14 MED ORDER — SODIUM BICARBONATE 8.4 % IV SOLN
25.0000 meq | Freq: Once | INTRAVENOUS | Status: AC
  Administered 2022-06-14: 25 meq via INTRAVENOUS

## 2022-06-14 MED ORDER — METRONIDAZOLE 500 MG/100ML IV SOLN
500.0000 mg | Freq: Once | INTRAVENOUS | Status: AC
  Administered 2022-06-14: 500 mg via INTRAVENOUS
  Filled 2022-06-14: qty 100

## 2022-06-14 MED ORDER — SODIUM BICARBONATE 8.4 % IV SOLN
100.0000 meq | Freq: Once | INTRAVENOUS | Status: DC
  Filled 2022-06-14: qty 50

## 2022-06-14 MED ORDER — LACTATED RINGERS IV BOLUS
20.0000 mL/kg | Freq: Once | INTRAVENOUS | Status: AC
  Administered 2022-06-14: 1088 mL via INTRAVENOUS

## 2022-06-14 MED ORDER — LACTATED RINGERS IV SOLN: INTRAVENOUS | Status: DC

## 2022-06-14 MED ORDER — LACTATED RINGERS IV BOLUS: 1000.0000 mL | Freq: Once | INTRAVENOUS | Status: DC

## 2022-06-14 MED ORDER — DOCUSATE SODIUM 100 MG PO CAPS: 100.0000 mg | ORAL_CAPSULE | Freq: Two times a day (BID) | ORAL | Status: DC | PRN

## 2022-06-14 MED ORDER — CALCIUM GLUCONATE 10 % IV SOLN: 1.0000 g | Freq: Once | INTRAVENOUS | Status: DC

## 2022-06-14 MED ORDER — CHLORHEXIDINE GLUCONATE CLOTH 2 % EX PADS
6.0000 | MEDICATED_PAD | Freq: Every day | CUTANEOUS | Status: DC
  Administered 2022-06-15 – 2022-06-16 (×2): 6 via TOPICAL

## 2022-06-14 MED ORDER — VANCOMYCIN HCL IN DEXTROSE 1-5 GM/200ML-% IV SOLN
1000.0000 mg | Freq: Once | INTRAVENOUS | Status: AC
  Administered 2022-06-14: 1000 mg via INTRAVENOUS
  Filled 2022-06-14 (×2): qty 200

## 2022-06-14 MED ORDER — CALCIUM GLUCONATE 10 % IV SOLN
INTRAVENOUS | Status: AC
  Administered 2022-06-14: 2 g via INTRAVENOUS
  Filled 2022-06-14: qty 20

## 2022-06-14 MED ORDER — POLYETHYLENE GLYCOL 3350 17 G PO PACK: 17.0000 g | PACK | Freq: Every day | ORAL | Status: DC | PRN

## 2022-06-14 MED ORDER — SODIUM CHLORIDE 0.9 % IV SOLN
250.0000 mL | INTRAVENOUS | Status: DC
  Administered 2022-06-15: 250 mL via INTRAVENOUS

## 2022-06-14 MED ORDER — HEPARIN SODIUM (PORCINE) 5000 UNIT/ML IJ SOLN
5000.0000 [IU] | Freq: Three times a day (TID) | INTRAMUSCULAR | Status: DC
  Administered 2022-06-14 – 2022-06-16 (×7): 5000 [IU] via SUBCUTANEOUS
  Filled 2022-06-14 (×7): qty 1

## 2022-06-14 MED ORDER — NOREPINEPHRINE 4 MG/250ML-% IV SOLN
2.0000 ug/min | INTRAVENOUS | Status: DC
  Administered 2022-06-14: 6 ug/min via INTRAVENOUS
  Filled 2022-06-14: qty 250

## 2022-06-14 MED ORDER — CALCIUM GLUCONATE 10 % IV SOLN: 2.0000 g | Freq: Once | INTRAVENOUS | Status: AC

## 2022-06-14 MED ORDER — ENOXAPARIN SODIUM 30 MG/0.3ML IJ SOSY: 30.0000 mg | PREFILLED_SYRINGE | INTRAMUSCULAR | Status: DC

## 2022-06-14 MED ORDER — PIPERACILLIN-TAZOBACTAM 3.375 G IVPB
3.3750 g | Freq: Three times a day (TID) | INTRAVENOUS | Status: DC
  Administered 2022-06-14 – 2022-06-16 (×5): 3.375 g via INTRAVENOUS
  Filled 2022-06-14 (×5): qty 50

## 2022-06-14 MED ORDER — INSULIN ASPART 100 UNIT/ML IJ SOLN
5.0000 [IU] | Freq: Once | INTRAMUSCULAR | Status: AC
  Administered 2022-06-14: 5 [IU] via INTRAVENOUS

## 2022-06-14 MED ORDER — INSULIN REGULAR(HUMAN) IN NACL 100-0.9 UT/100ML-% IV SOLN
INTRAVENOUS | Status: DC
  Administered 2022-06-14: 3 [IU]/h via INTRAVENOUS
  Administered 2022-06-14: 12 [IU]/h via INTRAVENOUS
  Filled 2022-06-14 (×2): qty 100

## 2022-06-14 NOTE — Sepsis Progress Note (Signed)
[facility]link is following code sepsis 

## 2022-06-14 NOTE — ED Provider Notes (Signed)
[facility] Provider Note    Event Date/Time   First MD Initiated Contact with Patient 06/14/22 1157     (approximate)   History   Altered Mental Status and Hyperglycemia Level V caveat:  AMS \ HPI  Michaela Johnson is a 61 y.o. female with diabetes presents to the ER after being found unresponsive by EMS after reported call out by family as had not been heard from 24 hours.  No report of trauma.  Patient has multiple presentations for substance abuse as well as noncompliance and DKA.     Physical Exam   Triage Vital Signs: ED Triage Vitals  Enc Vitals Group     BP 06/14/22 1050 (!) 76/53     Pulse Rate 06/14/22 1100 71     Resp 06/14/22 1100 16     Temp 06/14/22 1111 (!) 88.9 F (31.6 C)     Temp Source 06/14/22 1111 Rectal     SpO2 06/14/22 1100 100 %     Weight 06/14/22 1048 120 lb (54.4 kg)     Height 06/14/22 1048 '5\' 2"'$  (1.575 m)     Head Circumference --      Peak Flow --      Pain Score --      Pain Loc --      Pain Edu? --      Excl. in [place]? --     Most recent vital signs: Vitals:   06/14/22 1330 06/14/22 1400  BP: (!) 94/55 (!) 103/52  Pulse: 91 (!) 105  Resp: 19 20  Temp: (!) 88.3 F (31.3 C) (!) 89.4 F (31.9 C)  SpO2: 95% 98%     Constitutional: Drowsy with kusmal respirations.  Will respond to voice.  Is protecting her airway. Eyes: Conjunctivae are normal. perl Head: Atraumatic. Nose: No congestion/rhinnorhea. Mouth/Throat: Mucous membranes are dry Cardiovascular:   Tachycardic, poorly perfused Respiratory: kusmal resp Gastrointestinal: Soft and nontender.  Musculoskeletal:  no deformity Neurologic:  MAE spontaneously. No gross focal neurologic deficits are appreciated.  Skin:  Skin is warm, dry and intact. No rash noted. Psychiatric: Mood and affect are normal. Speech and behavior are normal.    ED Results / Procedures / Treatments   Labs (all labs ordered are listed, but only abnormal results are  displayed) Labs Reviewed  BASIC METABOLIC PANEL - Abnormal; Notable for the following components:      Result Value   Sodium 125 (*)    Potassium 6.2 (*)    Chloride 90 (*)    CO2 <7 (*)    Glucose, Bld 754 (*)    BUN 42 (*)    Creatinine, Ser 1.91 (*)    GFR, Estimated 29 (*)    All other components within normal limits  CBC - Abnormal; Notable for the following components:   WBC 20.6 (*)    RBC 3.30 (*)    Hemoglobin 11.1 (*)    MCV 109.1 (*)    Platelets 489 (*)    All other components within normal limits  LACTIC ACID, PLASMA - Abnormal; Notable for the following components:   Lactic Acid, Venous 2.7 (*)    All other components within normal limits  LACTIC ACID, PLASMA - Abnormal; Notable for the following components:   Lactic Acid, Venous 2.8 (*)    All other components within normal limits  PROTIME-INR - Abnormal; Notable for the following components:   Prothrombin Time 17.2 (*)    INR 1.4 (*)  All other components within normal limits  APTT - Abnormal; Notable for the following components:   aPTT 23 (*)    All other components within normal limits  ACETAMINOPHEN LEVEL - Abnormal; Notable for the following components:   Acetaminophen (Tylenol), Serum <10 (*)    All other components within normal limits  SALICYLATE LEVEL - Abnormal; Notable for the following components:   Salicylate Lvl <5.3 (*)    All other components within normal limits  BLOOD GAS, VENOUS - Abnormal; Notable for the following components:   pCO2, Ven 21 (*)    pO2, Ven 88 (*)    All other components within normal limits  BLOOD GAS, ARTERIAL - Abnormal; Notable for the following components:   pO2, Arterial 163 (*)    All other components within normal limits  GLUCOSE, CAPILLARY - Abnormal; Notable for the following components:   Glucose-Capillary 562 (*)    All other components within normal limits  BLOOD GAS, ARTERIAL - Abnormal; Notable for the following components:   pH, Arterial <6.95 (*)     pCO2 arterial <18 (*)    pO2, Arterial 111 (*)    Bicarbonate 2.8 (*)    Acid-base deficit 29.0 (*)    All other components within normal limits  BRAIN NATRIURETIC PEPTIDE - Abnormal; Notable for the following components:   B Natriuretic Peptide 298.4 (*)    All other components within normal limits  BASIC METABOLIC PANEL - Abnormal; Notable for the following components:   Sodium 130 (*)    CO2 <7 (*)    Glucose, Bld 633 (*)    BUN 39 (*)    Creatinine, Ser 1.57 (*)    GFR, Estimated 37 (*)    All other components within normal limits  GLUCOSE, CAPILLARY - Abnormal; Notable for the following components:   Glucose-Capillary 571 (*)    All other components within normal limits  GLUCOSE, CAPILLARY - Abnormal; Notable for the following components:   Glucose-Capillary 561 (*)    All other components within normal limits  CBG MONITORING, ED - Abnormal; Notable for the following components:   Glucose-Capillary >600 (*)    All other components within normal limits  CBG MONITORING, ED - Abnormal; Notable for the following components:   Glucose-Capillary 545 (*)    All other components within normal limits  TROPONIN I (HIGH SENSITIVITY) - Abnormal; Notable for the following components:   Troponin I (High Sensitivity) 18 (*)    All other components within normal limits  RESP PANEL BY RT-PCR (FLU A&B, COVID) ARPGX2  CULTURE, BLOOD (ROUTINE X 2)  CULTURE, BLOOD (ROUTINE X 2)  RESPIRATORY PANEL BY PCR  PROCALCITONIN  ETHANOL  URINALYSIS, ROUTINE W REFLEX MICROSCOPIC  BETA-HYDROXYBUTYRIC ACID  BETA-HYDROXYBUTYRIC ACID  URINE DRUG SCREEN, QUALITATIVE ([facility] ONLY)  CBG MONITORING, ED       RADIOLOGY Please see ED Course for my review and interpretation.  I personally reviewed all radiographic images ordered to evaluate for the above acute complaints and reviewed radiology reports and findings.  These findings were personally discussed with the patient.  Please see medical record  for radiology report.    PROCEDURES:  Critical Care performed: No  .Critical Care  Performed by: Merlyn Lot, MD Authorized by: Merlyn Lot, MD   Critical care provider statement:    Critical care time (minutes):  50   Critical care was necessary to treat or prevent imminent or life-threatening deterioration of the following conditions:  Metabolic crisis   Critical care  was time spent personally by me on the following activities:  Ordering and performing treatments and interventions, ordering and review of laboratory studies, ordering and review of radiographic studies, pulse oximetry, re-evaluation of patient's condition, review of old charts, obtaining history from patient or surrogate, examination of patient, evaluation of patient's response to treatment, discussions with primary provider, discussions with consultants and development of treatment plan with patient or surrogate    MEDICATIONS ORDERED IN ED: Medications  vancomycin (VANCOCIN) IVPB 1000 mg/200 mL premix (has no administration in time range)  insulin regular, human (MYXREDLIN) 100 units/ 100 mL infusion (3.6 Units/hr Intravenous Infusion Verify 06/14/22 1330)  lactated ringers infusion (has no administration in time range)  dextrose 5 % in lactated ringers infusion (has no administration in time range)  dextrose 50 % solution 0-50 mL (has no administration in time range)  0.9 %  sodium chloride infusion (has no administration in time range)  norepinephrine (LEVOPHED) '4mg'$  in 232m (0.016 mg/mL) premix infusion (6 mcg/min Intravenous Infusion Verify 06/14/22 1330)  lactated ringers bolus 1,000 mL (0 mLs Intravenous Hold 06/14/22 1143)  docusate sodium (COLACE) capsule 100 mg (has no administration in time range)  polyethylene glycol (MIRALAX / GLYCOLAX) packet 17 g (has no administration in time range)  sodium bicarbonate 150 mEq in sterile water 1,150 mL infusion ( Intravenous Infusion Verify 06/14/22 1330)   famotidine (PEPCID) IVPB 20 mg premix (has no administration in time range)  piperacillin-tazobactam (ZOSYN) IVPB 3.375 g (has no administration in time range)  sodium chloride 0.9 % bolus 1,000 mL (0 mLs Intravenous Stopped 06/14/22 1125)  sodium chloride 0.9 % bolus 1,000 mL (0 mLs Intravenous Stopped 06/14/22 1146)  ceFEPIme (MAXIPIME) 2 g in sodium chloride 0.9 % 100 mL IVPB (0 g Intravenous Stopped 06/14/22 1200)  metroNIDAZOLE (FLAGYL) IVPB 500 mg (0 mg Intravenous Stopped 06/14/22 1333)  lactated ringers bolus 1,088 mL (1,088 mLs Intravenous New Bag/Given 06/14/22 1215)  sodium chloride 0.9 % bolus 1,000 mL (0 mLs Intravenous Stopped 06/14/22 1200)  calcium gluconate inj 10% (1 g) URGENT USE ONLY! (2 g Intravenous Given 06/14/22 1212)  insulin aspart (novoLOG) injection 5 Units (5 Units Intravenous Given 06/14/22 1213)  sodium bicarbonate injection 25 mEq (25 mEq Intravenous Given 06/14/22 1259)     IMPRESSION / MDM / ASSESSMENT AND PLAN / ED COURSE  I reviewed the triage vital signs and the nursing notes.                              Differential diagnosis includes, but is not limited to, DKA, HHS, sepsis, electrolyte abnormality, dehydration  Patient presenting to the ER for evaluation of symptoms as described above.  Based on symptoms, risk factors and considered above differential, this presenting complaint could reflect a potentially life-threatening illness therefore the patient will be placed on continuous pulse oximetry and telemetry for monitoring.  Patient critically ill-appearing have high suspicion for significant metabolic derangement and likely DKA given her history.  No report of any trauma.  She is protecting her airway though she is quite drowsy we will closely observe.  Laboratory evaluation will be sent to evaluate for the above complaints.  Aggressive IV fluid resuscitation has been ordered as she is hypotensive.    Clinical Course as of 06/14/22 1434  Sat Jun 14, 2022  1124 Patient profoundly hypothermic with leukocytosis hypotensive receiving IV fluids will start broad-spectrum antibiotics.  Have high suspicion for DKA. [PR]  1137 Patient with persistent hypotension despite IV fluids was started on nor epi.  Will start on insulin infusion as she does have evidence of severe DKA with profound metabolic acidosis. [PR]  1017 Chest x-ray on my review and interpretation does not show any evidence of consolidation.  Case discussed in consultation with ICU attending. [PR]    Clinical Course User Index [PR] Merlyn Lot, MD     FINAL CLINICAL IMPRESSION(S) / ED DIAGNOSES   Final diagnoses:  Diabetic ketoacidosis without coma associated with type 1 diabetes mellitus ([place])     Rx / DC Orders   ED Discharge Orders     None        Note:  This document was prepared using Dragon voice recognition software and may include unintentional dictation errors.    Merlyn Lot, MD 06/14/22 1434

## 2022-06-14 NOTE — Consult Note (Signed)
PHARMACY -  BRIEF ANTIBIOTIC NOTE   Pharmacy has received consult(s) for vancomycin & cefepime from an ED provider.  The patient's profile has been reviewed for ht/wt/allergies/indication/available labs.    One time order(s) placed for: Vancomycin 1g IV x1 in ED Cefepime 2g IV x1 in ED Pt also receiving Metronidazole '500mg'$  IV x1 in ED  Further antibiotics/pharmacy consults should be ordered by admitting physician if indicated.                       Thank you, Lorna Dibble 06/14/2022  11:42 AM

## 2022-06-14 NOTE — ED Triage Notes (Signed)
Pt via EMS from home. Neighbor checked on her this morning and found her like this, last seen normal last night. Usually ambulatory and alert. On arrival, pt moaning and responsive to voice but not answering questions appropriately. EMS reports CBG of 600 with hx of diabetes. HR 90s, BP 80/50 per EMS.

## 2022-06-14 NOTE — Procedures (Addendum)
Given patients significant acidemia and hypotension and poorly functioning IV's, attempted emergent central line placement under ultrasound guidance through the right subclavian vein. Patient was initially calm to proceed, but became agitated mid procedure. One stick attempted and aborted given agitation. Post procedural ultrasound showed lung sliding with a [place] sign on M mode.    Update: CXR ordered and shows a small right apical pneumothorax. Patient is oxygenating well and her blood pressure is maintained. Will initiate oxygen therapy via nasal cannula and obtain a repeat CXR to assess for evolution of the pneumothorax. Should there be expansion, she will require a chest tube under ultrasound guidance.  Armando Reichert, MD [facility]

## 2022-06-14 NOTE — Consult Note (Signed)
CODE SEPSIS - PHARMACY COMMUNICATION  **Broad Spectrum Antibiotics should be administered within 1 hour of Sepsis diagnosis**  Time Code Sepsis Called/Page Received: 1124  Antibiotics Ordered: 1130  Time of 1st antibiotic administration: 0867  Additional action taken by pharmacy: N/A  If necessary, Name of Provider/Nurse Contacted: N/A  Lorna Dibble ,PharmD Clinical Pharmacist  06/14/2022  11:43 AM

## 2022-06-14 NOTE — Consult Note (Signed)
PHARMACY CONSULT NOTE - FOLLOW UP  Pharmacy Consult for Electrolyte Monitoring and Replacement   Recent Labs: Potassium (mmol/L)  Date Value  06/14/2022 6.2 (H)   Magnesium (mg/dL)  Date Value  06/03/2022 2.2   Calcium (mg/dL)  Date Value  06/14/2022 9.4   Albumin (g/dL)  Date Value  06/01/2022 4.0   Phosphorus (mg/dL)  Date Value  06/04/2022 3.5   Sodium (mmol/L)  Date Value  06/14/2022 125 (L)     Assessment: Patient admitted with sepsis and AMS. PMH includes substance abuse, diabetes , and A.fib. Pharmacy consulted to manage electrolytes.  Goal of Therapy:  K >/= 4, Mg >/=2, and all other electrolytes WNL  Plan:  Admitted with DKA, elevated K and low Na. Currently on insulin infusion. Calcium gluconate and NS bolus given. No additional electrolytes supplementation needed at this time. Will follow up blood work and replace electrolytes as needed.  Michaela Johnson PharmD, BCPS 06/14/2022 12:26 PM

## 2022-06-14 NOTE — H&P (Addendum)
NAME:  Michaela Johnson, MRN:  [id number], DOB:  September 07, 1960, LOS: 0 ADMISSION DATE:  06/14/2022, CONSULTATION DATE: 06/14/22 REFERRING MD: Dr. Quentin Cornwall, CHIEF COMPLAINT: AMS    History of Present Illness:  This is a 61 yo female who presented to [facility] ER via EMS on 11/25  with altered mental status.  Pts neighbor went to check on her the morning of 11/25 and found the pt confused.  Pts last known well time was the night of 11/24.  EMS reported the pts CBG reading was 600/bp 80/50/hr 90.  Pt recently hospitalized from 06/01/22 to 06/04/2022 with DKA and possible CAP.  However, the pt left AGAINST MEDICAL ADVICE on 06/04/2022.  All information for HPI obtained from pts chart due to pts altered mental status.    ED Course Upon arrival to the ER pt remained altered.  ER vital signs:  bp 76/53/hr 81/resp rate 16/O2 sats 100% on RA/ temp 88.9 F.  Pt received 10 units iv insulin/2 g of calcium gluconate; vancomycin; cefepime; metronidazole and 5L IV fluid bolus. Lab results ruled pt in for DKA insulin gtt.  Levophed gtt initiated due to continued hypotension. PCCM team contacted for ICU admission.   Lab results: Na+ 125/K+ 6.2/chloride 90/CO2 <7/glucose 754/BUN 42/creatinine 1.91/wbc 20.6.  CXR 11/25: Mild increased interstitial markings throughout both lungs mild interstitial edema versus atypical infection.   Pertinent  Medical History  ETOH Abuse Anxiety Closed trimalleolar fracture of right ankle s/p repair  Constipation  Depression  Type II Diabetes Mellitus GERD  Neuropathy HTN Tobacco Use Paroxysmal Atrial Fibrillation (xarelto) Panic Attacks Pneumonia  Vitamin D Deficiency  Reduction and internal fixation of displaced right intertrochanteric hip fracture 01/04/22 Closed reduction of displaced right distal radius fracture 01/02/22  Significant Hospital Events: Including procedures, antibiotic start and stop dates in addition to other pertinent events   11/25: Pt admitted to ICU with  acute toxic metabolic encephalopathy; hypotension secondary to hypovolemic and possible septic shock; and DKA requiring insulin gtt and levophed gtt   Micro Data:   Blood x2 11/25>> MRSA PCR 11/25>> Resp Panel by RT-PCR (Flu A&B, Covid) 11/25>> Respiratory (~20 pathogens) panel by PCR 11/25>>   Antimicrobials:   Cefepime 11/25 x1 dose Vancomycin 11/25 x1 dose  Metronidazole 11/25 x1 dose  Zosyn 11/25>>   Interim History / Subjective:  Pt agitated and confused. Pt severely hypotensive sbp 60 to 70's, currently receiving aggressive iv fluid resuscitation and levophed gtt ordered.    Objective   Blood pressure (!) 65/46, pulse 82, temperature (!) 88.9 F (31.6 C), temperature source Rectal, resp. rate 16, height '5\' 2"'$  (1.575 m), weight 54.4 kg, SpO2 (!) 66 %.        Intake/Output Summary (Last 24 hours) at 06/14/2022 1159 Last data filed at 06/14/2022 1125 Gross per 24 hour  Intake 1000 ml  Output --  Net 1000 ml   Filed Weights   06/14/22 1048  Weight: 54.4 kg    Examination: General: Acute on chronically-ill appearing female, in mild respiratory distress  HENT: Supple, JVD present  Lungs: Faint rhonchi throughout, mildly tachypneic  Cardiovascular: Irregular irregular no r/g, 2+ radial/1+ distal pulses, no edema  Abdomen: +BS x4, soft, non distended, non tender  Extremities: Normal tone and moves all extremities  Neuro: Confused, agitated, able to follow commands some commands, PERRLA  GU: Deferred   Resolved Hospital Problem list     Assessment & Plan:  Diabetic ketoacidosis  - Continue insulin gtt until anion gap closed and serum  CO2 20 or higher  - BMP q4hrs and Beta-hydroxybutyric acid q8hrs while on insulin gtt  - CBG's per endotool - IV fluids per DKA protocol  - Diabetes coordinator consulted appreciate input    Acute respiratory failure secondary to severe anion gap metabolic acidosis and possible aspiration event  Hx: Tobacco Dependence  -  Supplemental O2 for dyspnea and/or hypoxia - Maintain O2 sats >92% - Correct metabolic derangements  - Smoking cessation counseling once mentation improves   Hypotension multifactorial: hypovolemic and possible septic shock Hx: Paroxysmal atrial fibrillation   - Continuous telemetry monitoring - IV fluids and prn levophed gtt to maintain map >65  Acute renal failure with hyperkalemia secondary to ATN in the setting of hypovolemic and possible septic shock  Severe metabolic acidosis  Lactic acidosis  Pseudohyponatremia in the setting of DKA  - Trend BMP and lactic acid  - Replace electrolytes as indicated  - Monitor UOP - Avoid nephrotoxic medications  - Sodium bicarb gtt '@100'$  ml/hr     Possible sepsis secondary to atypical viral respiratory infection  - Trend WBC and monitor fever curve  - Trend PCT  - Follow cultures - Continue abx therapy as outlined above pending culture results and sensitivities    GERD  - IV pepcid    Hypothermia  - Apply bear hugger to maintain normothermia    Acute toxic metabolic encephalopathy  Hx: ETOH abuse, Depression, Panic Attacks, and Cocaine Abuse  Hx: Depression and panic attacks - Correct metabolic derangements - Urine drug screen/salicylate level/acetaminophen level/ethanol level pending  - Folic acid, thiamine, and mvi  - CIWA protocol  - Once mentation improves will need polysubstance abuse cessation counseling  - If mentation does not improve following correction of metabolic derangements will order CT Head   Best Practice (right click and "Reselect all SmartList Selections" daily)   Diet/type: NPO DVT prophylaxis: LMWH GI prophylaxis: H2B Lines: N/A Foley:  N/A Code Status:  full code Last date of multidisciplinary goals of care discussion [N/A]  Labs   CBC: Recent Labs  Lab 06/14/22 1053  WBC 20.6*  HGB 11.1*  HCT 36.0  MCV 109.1*  PLT 489*    Basic Metabolic Panel: Recent Labs  Lab 06/14/22 1053  NA 125*  K  6.2*  CL 90*  CO2 <7*  GLUCOSE 754*  BUN 42*  CREATININE 1.91*  CALCIUM 9.4   GFR: Estimated Creatinine Clearance: 23.8 mL/min (A) (by C-G formula based on SCr of 1.91 mg/dL (H)). Recent Labs  Lab 06/14/22 1053  WBC 20.6*    Liver Function Tests: No results for input(s): "AST", "ALT", "ALKPHOS", "BILITOT", "PROT", "ALBUMIN" in the last 168 hours. No results for input(s): "LIPASE", "AMYLASE" in the last 168 hours. No results for input(s): "AMMONIA" in the last 168 hours.  ABG    Component Value Date/Time   PHART 7.46 (H) 02/08/2018 0341   PCO2ART 42 02/08/2018 0341   PO2ART 59 (L) 02/08/2018 0341   HCO3 7.5 (L) 04/18/2021 1710   ACIDBASEDEF 18.8 (H) 04/18/2021 1710   O2SAT 95.1 04/18/2021 1710     Coagulation Profile: No results for input(s): "INR", "PROTIME" in the last 168 hours.  Cardiac Enzymes: No results for input(s): "CKTOTAL", "CKMB", "CKMBINDEX", "TROPONINI" in the last 168 hours.  HbA1C: Hgb A1c MFr Bld  Date/Time Value Ref Range Status  06/02/2022 03:17 AM 11.7 (H) 4.8 - 5.6 % Final    Comment:    (NOTE) Pre diabetes:  5.7%-6.4%  Diabetes:              >6.4%  Glycemic control for   <7.0% adults with diabetes   01/02/2022 04:27 PM 8.4 (H) 4.8 - 5.6 % Final    Comment:    (NOTE) Pre diabetes:          5.7%-6.4%  Diabetes:              >6.4%  Glycemic control for   <7.0% adults with diabetes     CBG: Recent Labs  Lab 06/14/22 1049  GLUCAP >600*    Review of Systems:   Unable to assess pt with acute toxic metabolic encephalopathy   Past Medical History:  She,  has a past medical history of Alcohol use, Anxiety, Arrhythmia, Arthritis, Chronic alcohol use (07/31/2020), Chronic anticoagulation (07/31/2020), 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   Surgical History:   Past Surgical History:  Procedure Laterality Date   ANKLE FUSION Right 07/30/2020   Procedure: ANKLE FUSION;  Surgeon: Altamese [place], MD;  Location: [place];  Service: Orthopedics;  Laterality: Right;   APPLICATION OF WOUND VAAPPLICATION OF WOUND VAC (Right Ankle)C (Right Ankle)  53/61/4431   APPLICATION OF WOUND VAC Right 07/30/2020   Procedure: APPLICATION OF WOUND VAC;  Surgeon: Altamese [place], MD;  Location: [place];  Service: Orthopedics;  Laterality: Right;   HARDWARE REMOVAL Right 07/30/2020   Procedure: HARDWARE REMOVAL RIGHT ANKLE;  Surgeon: Altamese , MD;  Location: [place];  Service: Orthopedics;  Laterality: Right;   HARDWARE REMOVAL RIGHT ANKLE (Right Ankle  07/31/2020   INTRAMEDULLARY (IM) NAIL INTERTROCHANTERIC Right 01/04/2022   Procedure: INTRAMEDULLARY (IM) NAIL INTERTROCHANTRIC;  Surgeon: Corky Mull, MD;  Location: [facility];  Service: Orthopedics;  Laterality: Right;   ORIF ANKLE FRACTURE Right 07/04/2020   Procedure: OPEN REDUCTION INTERNAL FIXATION (ORIF) ANKLE FRACTURE;  Surgeon: Lovell Sheehan, MD;  Location: [facility];  Service: Orthopedics;  Laterality: Right;     Social History:   reports that she has been smoking cigarettes, e-cigarettes, and cigars. She has a 41.00 pack-year smoking history. She has never used smokeless tobacco. She reports current alcohol use of about 6.0 standard drinks of alcohol per week. She reports current drug use. Frequency: 7.00 times per week. Drug: Marijuana.   Family History:  Her family history includes CAD in her father; Diabetes in her mother; Heart Problems in her father; Heart failure in her father.   Allergies Allergies  Allergen Reactions   Glipizide     Other reaction(s): Other (See Comments)   Metformin Diarrhea   Codeine Itching   Levaquin [Levofloxacin In D5w]  Itching     Home Medications  Prior to Admission medications   Medication Sig Start Date End Date Taking? Authorizing Provider  acetaminophen (TYLENOL) 500 MG tablet Take 1-2 tablets (500-1,000 mg total) by mouth every 6 (six) hours as needed for moderate pain. 01/06/22   Lattie Corns, PA-C  albuterol (PROVENTIL HFA;VENTOLIN HFA) 108 (90 Base) MCG/ACT inhaler Inhale 2 puffs into the lungs every 6 (six) hours as needed for wheezing or shortness of breath. 02/14/18   Pyreddy, Reatha Harps, MD  amLODipine-benazepril (LOTREL) 5-40 MG capsule Take 1 capsule by mouth daily.    [provider]  Cholecalciferol (VITAMIN D) 125 MCG (5000 UT)  CAPS Take 1 capsule by mouth daily. 08/03/20   Ainsley Spinner, PA-C  diltiazem (CARDIZEM CD) 180 MG 24 hr capsule Take 1 capsule by mouth once daily 05/12/22   Minna Merritts, MD  diphenhydrAMINE (BENADRYL) 12.5 MG/5ML liquid Take 25 mg by mouth 4 (four) times daily as needed for itching.    [provider]  docusate sodium (COLACE) 100 MG capsule Take 1 capsule (100 mg total) by mouth 2 (two) times daily. 01/07/22   Lorella Nimrod, MD  FLUoxetine (PROZAC) 40 MG capsule Take 40 mg by mouth daily.    [provider]  gabapentin (NEURONTIN) 300 MG capsule Take 900 mg by mouth at bedtime. 02/28/20   [provider]  insulin aspart (NOVOLOG) 100 UNIT/ML injection Inject 4 Units into the skin 3 (three) times daily with meals. Short-acting insulin Patient not taking: Reported on 01/16/2022 04/20/21 07/19/21  Enzo Bi, MD  JARDIANCE 10 MG TABS tablet Take 10 mg by mouth daily. 11/25/21   [provider]  LANTUS SOLOSTAR 100 UNIT/ML Solostar Pen Inject 37 Units into the skin daily. This is your home dose of long-acting insulin. 04/20/21   Enzo Bi, MD  Multiple Vitamin (MULTIVITAMIN WITH MINERALS) TABS tablet Take 1 tablet by mouth daily. 01/07/22   Lorella Nimrod, MD  omeprazole (PRILOSEC) 20 MG capsule Take 20 mg by mouth daily.     [provider]  rivaroxaban (XARELTO) 20 MG TABS tablet Take 1 tablet (20 mg total) by mouth daily with supper. 03/09/19   Minna Merritts, MD     Critical care time: 60 minutes      Donell Beers, [place] Pager [phone] (please enter 7 digits) PCCM Consult Pager [phone] (please enter 7 digits)

## 2022-06-14 NOTE — Progress Notes (Signed)
Pharmacy Antibiotic Note  Michaela Johnson is a 60 y.o. female w/ PMH of DKA, PNA, EtOH abuse, DM, atrial fibrillation admitted on 06/14/2022 with toxic metabolic encephalopathy; hypotension secondary to hypovolemic and possible septic shock; and DKA requiring insulin gtt and levophed gtt.  Pharmacy has been consulted for Zosyn dosing.  Plan: start Zosyn 3.375g IV q8h (4 hour infusion).  Height: '5\' 2"'$  (157.5 cm) Weight: 54.4 kg (120 lb) IBW/kg (Calculated) : 50.1  Temp (24hrs), Avg:88.9 F (31.6 C), Min:88.9 F (31.6 C), Max:88.9 F (31.6 C)  Recent Labs  Lab 06/14/22 1053  WBC 20.6*  CREATININE 1.91*    Estimated Creatinine Clearance: 23.8 mL/min (A) (by C-G formula based on SCr of 1.91 mg/dL (H)).    Allergies  Allergen Reactions   Glipizide     Other reaction(s): Other (See Comments)   Metformin Diarrhea   Codeine Itching   Levaquin [Levofloxacin In D5w] Itching    Antimicrobials this admission: 11/25 vancomycin, metronidazole, cefepime x 1 11/25 Zosyn >>   Microbiology results: 11/25 BCx: pending 11/25 Sputum: pending   Thank you for allowing pharmacy to be a part of this patient's care.  Dallie Piles 06/14/2022 12:32 PM

## 2022-06-15 ENCOUNTER — Inpatient Hospital Stay: Payer: Self-pay

## 2022-06-15 DIAGNOSIS — G928 Other toxic encephalopathy: Secondary | ICD-10-CM

## 2022-06-15 LAB — BASIC METABOLIC PANEL
Anion gap: 12 (ref 5–15)
Anion gap: 9 (ref 5–15)
Anion gap: 9 (ref 5–15)
Anion gap: 9 (ref 5–15)
Anion gap: 15 (ref 5–15)
BUN: 32 mg/dL — ABNORMAL HIGH (ref 8–23)
BUN: 26 mg/dL — ABNORMAL HIGH (ref 8–23)
BUN: 23 mg/dL (ref 8–23)
BUN: 20 mg/dL (ref 8–23)
BUN: 19 mg/dL (ref 8–23)
CO2: 25 mmol/L (ref 22–32)
CO2: 30 mmol/L (ref 22–32)
CO2: 32 mmol/L (ref 22–32)
CO2: 29 mmol/L (ref 22–32)
CO2: 24 mmol/L (ref 22–32)
Calcium: 8 mg/dL — ABNORMAL LOW (ref 8.9–10.3)
Calcium: 7.8 mg/dL — ABNORMAL LOW (ref 8.9–10.3)
Calcium: 7.7 mg/dL — ABNORMAL LOW (ref 8.9–10.3)
Calcium: 8 mg/dL — ABNORMAL LOW (ref 8.9–10.3)
Calcium: 7.5 mg/dL — ABNORMAL LOW (ref 8.9–10.3)
Chloride: 97 mmol/L — ABNORMAL LOW (ref 98–111)
Chloride: 98 mmol/L (ref 98–111)
Chloride: 97 mmol/L — ABNORMAL LOW (ref 98–111)
Chloride: 100 mmol/L (ref 98–111)
Chloride: 94 mmol/L — ABNORMAL LOW (ref 98–111)
Creatinine, Ser: 0.92 mg/dL (ref 0.44–1.00)
Creatinine, Ser: 0.72 mg/dL (ref 0.44–1.00)
Creatinine, Ser: 0.65 mg/dL (ref 0.44–1.00)
Creatinine, Ser: 0.7 mg/dL (ref 0.44–1.00)
Creatinine, Ser: 0.66 mg/dL (ref 0.44–1.00)
GFR, Estimated: >60 mL/min (ref >60)
GFR, Estimated: >60 mL/min (ref >60)
GFR, Estimated: >60 mL/min (ref >60)
GFR, Estimated: >60 mL/min (ref >60)
GFR, Estimated: >60 mL/min (ref >60)
Glucose, Bld: 147 mg/dL — ABNORMAL HIGH (ref 70–99)
Glucose, Bld: 146 mg/dL — ABNORMAL HIGH (ref 70–99)
Glucose, Bld: 111 mg/dL — ABNORMAL HIGH (ref 70–99)
Glucose, Bld: 160 mg/dL — ABNORMAL HIGH (ref 70–99)
Glucose, Bld: 259 mg/dL — ABNORMAL HIGH (ref 70–99)
Potassium: 3.3 mmol/L — ABNORMAL LOW (ref 3.5–5.1)
Potassium: 3 mmol/L — ABNORMAL LOW (ref 3.5–5.1)
Potassium: 3 mmol/L — ABNORMAL LOW (ref 3.5–5.1)
Potassium: 4.1 mmol/L (ref 3.5–5.1)
Potassium: 4.1 mmol/L (ref 3.5–5.1)
Sodium: 134 mmol/L — ABNORMAL LOW (ref 135–145)
Sodium: 137 mmol/L (ref 135–145)
Sodium: 138 mmol/L (ref 135–145)
Sodium: 138 mmol/L (ref 135–145)
Sodium: 133 mmol/L — ABNORMAL LOW (ref 135–145)

## 2022-06-15 LAB — BLOOD GAS, VENOUS
Acid-base deficit: 7.6 mmol/L — ABNORMAL HIGH (ref 0.0–2.0)
Bicarbonate: 15.7 mmol/L — ABNORMAL LOW (ref 20.0–28.0)
O2 Saturation: 99.3 %
Patient temperature: 37
pCO2, Ven: 26 mmHg — ABNORMAL LOW (ref 44–60)
pH, Ven: 7.39 (ref 7.25–7.43)
pO2, Ven: 106 mmHg — ABNORMAL HIGH (ref 32–45)

## 2022-06-15 LAB — GLUCOSE, CAPILLARY
Glucose-Capillary: 107 mg/dL — ABNORMAL HIGH (ref 70–99)
Glucose-Capillary: 124 mg/dL — ABNORMAL HIGH (ref 70–99)
Glucose-Capillary: 129 mg/dL — ABNORMAL HIGH (ref 70–99)
Glucose-Capillary: 129 mg/dL — ABNORMAL HIGH (ref 70–99)
Glucose-Capillary: 140 mg/dL — ABNORMAL HIGH (ref 70–99)
Glucose-Capillary: 148 mg/dL — ABNORMAL HIGH (ref 70–99)
Glucose-Capillary: 153 mg/dL — ABNORMAL HIGH (ref 70–99)
Glucose-Capillary: 265 mg/dL — ABNORMAL HIGH (ref 70–99)
Glucose-Capillary: 298 mg/dL — ABNORMAL HIGH (ref 70–99)
Glucose-Capillary: 93 mg/dL (ref 70–99)

## 2022-06-15 LAB — CBC WITH DIFFERENTIAL/PLATELET
Abs Immature Granulocytes: 0.05 10*3/uL (ref 0.00–0.07)
Basophils Absolute: 0 10*3/uL (ref 0.0–0.1)
Basophils Relative: 0 %
Eosinophils Absolute: 0 10*3/uL (ref 0.0–0.5)
Eosinophils Relative: 0 %
HCT: 28.9 % — ABNORMAL LOW (ref 36.0–46.0)
Hemoglobin: 10.4 g/dL — ABNORMAL LOW (ref 12.0–15.0)
Immature Granulocytes: 1 %
Lymphocytes Relative: 7 %
Lymphs Abs: 0.6 10*3/uL — ABNORMAL LOW (ref 0.7–4.0)
MCH: 33.8 pg (ref 26.0–34.0)
MCHC: 36 g/dL (ref 30.0–36.0)
MCV: 93.8 fL (ref 80.0–100.0)
Monocytes Absolute: 0.7 10*3/uL (ref 0.1–1.0)
Monocytes Relative: 8 %
Neutro Abs: 7.6 10*3/uL (ref 1.7–7.7)
Neutrophils Relative %: 84 %
Platelets: 321 10*3/uL (ref 150–400)
RBC: 3.08 MIL/uL — ABNORMAL LOW (ref 3.87–5.11)
RDW: 11.8 % (ref 11.5–15.5)
WBC: 9 10*3/uL (ref 4.0–10.5)
nRBC: 0 % (ref 0.0–0.2)

## 2022-06-15 LAB — PHOSPHORUS: Phosphorus: 2.2 mg/dL — ABNORMAL LOW (ref 2.5–4.6)

## 2022-06-15 LAB — BETA-HYDROXYBUTYRIC ACID: Beta-Hydroxybutyric Acid: 0.52 mmol/L — ABNORMAL HIGH (ref 0.05–0.27)

## 2022-06-15 LAB — PROCALCITONIN: Procalcitonin: 7.75 ng/mL

## 2022-06-15 LAB — MAGNESIUM: Magnesium: 1.9 mg/dL (ref 1.7–2.4)

## 2022-06-15 MED ORDER — POTASSIUM CHLORIDE 20 MEQ PO PACK
40.0000 meq | PACK | ORAL | Status: AC
  Administered 2022-06-15 (×2): 40 meq via ORAL
  Filled 2022-06-15 (×2): qty 2

## 2022-06-15 MED ORDER — ACETAMINOPHEN 500 MG PO TABS
1000.0000 mg | ORAL_TABLET | Freq: Three times a day (TID) | ORAL | Status: DC | PRN
  Administered 2022-06-15 – 2022-06-17 (×5): 1000 mg via ORAL
  Filled 2022-06-15 (×5): qty 2

## 2022-06-15 MED ORDER — LOPERAMIDE HCL 2 MG PO CAPS
4.0000 mg | ORAL_CAPSULE | Freq: Once | ORAL | Status: AC
  Administered 2022-06-15: 4 mg via ORAL
  Filled 2022-06-15: qty 2

## 2022-06-15 MED ORDER — MAGNESIUM SULFATE 2 GM/50ML IV SOLN
2.0000 g | Freq: Once | INTRAVENOUS | Status: AC
  Administered 2022-06-15: 2 g via INTRAVENOUS
  Filled 2022-06-15: qty 50

## 2022-06-15 MED ORDER — LACTATED RINGERS IV SOLN: INTRAVENOUS | Status: DC

## 2022-06-15 MED ORDER — INSULIN GLARGINE-YFGN 100 UNIT/ML ~~LOC~~ SOLN
10.0000 [IU] | Freq: Two times a day (BID) | SUBCUTANEOUS | Status: DC
  Administered 2022-06-15 (×2): 10 [IU] via SUBCUTANEOUS
  Filled 2022-06-15 (×4): qty 0.1

## 2022-06-15 MED ORDER — POTASSIUM CHLORIDE 10 MEQ/100ML IV SOLN
10.0000 meq | INTRAVENOUS | Status: AC
  Administered 2022-06-15 (×4): 10 meq via INTRAVENOUS
  Filled 2022-06-15 (×4): qty 100

## 2022-06-15 MED ORDER — GABAPENTIN 400 MG PO CAPS
400.0000 mg | ORAL_CAPSULE | Freq: Every day | ORAL | Status: DC
  Administered 2022-06-15: 400 mg via ORAL
  Filled 2022-06-15: qty 1

## 2022-06-15 MED ORDER — INSULIN ASPART 100 UNIT/ML IJ SOLN
3.0000 [IU] | Freq: Three times a day (TID) | INTRAMUSCULAR | Status: DC
  Administered 2022-06-15 – 2022-06-17 (×8): 3 [IU] via SUBCUTANEOUS
  Filled 2022-06-15 (×8): qty 1

## 2022-06-15 MED ORDER — POTASSIUM & SODIUM PHOSPHATES 280-160-250 MG PO PACK
2.0000 | PACK | Freq: Three times a day (TID) | ORAL | Status: AC
  Administered 2022-06-15 (×4): 2 via ORAL
  Filled 2022-06-15 (×4): qty 2

## 2022-06-15 MED ORDER — ORAL CARE MOUTH RINSE: 15.0000 mL | OROMUCOSAL | Status: DC | PRN

## 2022-06-15 MED ORDER — POTASSIUM & SODIUM PHOSPHATES 280-160-250 MG PO PACK: 2.0000 | PACK | Freq: Three times a day (TID) | ORAL | Status: DC

## 2022-06-15 MED ORDER — INSULIN ASPART 100 UNIT/ML IJ SOLN
0.0000 [IU] | Freq: Every day | INTRAMUSCULAR | Status: DC
  Administered 2022-06-15: 3 [IU] via SUBCUTANEOUS
  Filled 2022-06-15: qty 1

## 2022-06-15 MED ORDER — INSULIN ASPART 100 UNIT/ML IJ SOLN
0.0000 [IU] | Freq: Three times a day (TID) | INTRAMUSCULAR | Status: DC
  Administered 2022-06-15: 2 [IU] via SUBCUTANEOUS
  Administered 2022-06-15: 5 [IU] via SUBCUTANEOUS
  Filled 2022-06-15 (×2): qty 1

## 2022-06-15 NOTE — Consult Note (Signed)
PHARMACY CONSULT NOTE - FOLLOW UP  Pharmacy Consult for Electrolyte Monitoring and Replacement   Recent Labs: Potassium (mmol/L)  Date Value  06/15/2022 3.0 (L)   Magnesium (mg/dL)  Date Value  06/15/2022 1.9   Calcium (mg/dL)  Date Value  06/15/2022 7.8 (L)   Albumin (g/dL)  Date Value  06/01/2022 4.0   Phosphorus (mg/dL)  Date Value  06/15/2022 2.2 (L)   Sodium (mmol/L)  Date Value  06/15/2022 137     Assessment: Patient admitted with sepsis and AMS. PMH includes substance abuse, diabetes , and A.fib. Pharmacy consulted to manage electrolytes.  11/25 - Admitted with DKA, elevated K and low Na. Currently on insulin infusion. Calcium gluconate and NS bolus given. 11/26 - transitioning insulin gtt to Sub-q insulin.  Goal of Therapy:  K >/= 4, Mg >/=2, and all other electrolytes WNL  Plan:  Scr 1.22>0.92>0.72; Net I/O +8.8L;  K 3.3>3:  Replacing with 36mq PO q4h x2 doses & KCL IV 17m q1h x4 doses (per CCM) Mg 1.9: will target >2 ISO Afib and hypoK Replacing with MgSO 2g IV x1 Phos 2.2: Replacing with Phos-NaK packets 2pks TIDAC x4 doses. Will follow up blood work and replace electrolytes as needed.  BrLorna DibblePharmD, BCLexington Surgery Centerlinical Pharmacist 06/15/2022 7:58 AM

## 2022-06-15 NOTE — Progress Notes (Signed)
Notified by lab that phlebotomist will be up to draw labs asap.

## 2022-06-15 NOTE — Progress Notes (Signed)
Semglee up from pharmacy and given to patient. Will continue insulin gtt per policy. D5LR discontinued and LR initiated per order. Will continue to monitor.

## 2022-06-15 NOTE — Progress Notes (Signed)
Notified by lab that results were inconsistent and phlebotomist would be up to redraw blood asap. Will continue to monitor.

## 2022-06-15 NOTE — Progress Notes (Signed)
NAME:  Michaela Johnson, MRN:  [id number], DOB:  1961/04/26, LOS: 1 ADMISSION DATE:  06/14/2022, CHIEF COMPLAINT:  DKA   History of Present Illness:   61 yo female who presented to [facility] ER via EMS on 11/25  with altered mental status.  Pts neighbor went to check on her the morning of 11/25 and found the pt confused.  She has a history of recurrent history of DKA. Patient's last known well time was the night of 11/24.  EMS reported the pts CBG reading was 600/bp 80/50/hr 90.  Pt recently hospitalized from 06/01/22 to 06/04/2022 with DKA and possible CAP.  However, the pt left AGAINST MEDICAL ADVICE on 06/04/2022.  All information for HPI obtained from pts chart due to pts altered mental status.   ED Course Upon arrival to the ER pt remained altered.  ER vital signs:  bp 76/53/hr 81/resp rate 16/O2 sats 100% on RA/ temp 88.9 F.  Pt received 10 units iv insulin/2 g of calcium gluconate; vancomycin; cefepime; metronidazole and 5L IV fluid bolus. Lab results ruled pt in for DKA insulin gtt.  Levophed gtt initiated due to continued hypotension. PCCM team contacted for ICU admission.   Lab results: Na+ 125/K+ 6.2/chloride 90/CO2 <7/glucose 754/BUN 42/creatinine 1.91/wbc 20.6.   CXR 11/25: Mild increased interstitial markings throughout both lungs mild interstitial edema versus atypical infection.  Pertinent  Medical History  ETOH Abuse Anxiety Closed trimalleolar fracture of right ankle s/p repair  Constipation  Depression  Type II Diabetes Mellitus GERD  Neuropathy HTN Tobacco Use Paroxysmal Atrial Fibrillation (xarelto) Panic Attacks Pneumonia  Vitamin D Deficiency  Reduction and internal fixation of displaced right intertrochanteric hip fracture 01/04/22 Closed reduction of displaced right distal radius fracture 01/02/22  Significant Hospital Events: Including procedures, antibiotic start and stop dates in addition to other pertinent events   11/25: Pt admitted to ICU with acute toxic  metabolic encephalopathy; hypotension secondary to hypovolemic and possible septic shock; and DKA requiring insulin gtt and levophed gtt.  Interim History / Subjective:  No shortness of breath this morning, no chest pain or discomfort. Sleepy this morning but aroused during the day.  Objective   Blood pressure 139/66, pulse (!) 109, temperature 99.5 F (37.5 C), temperature source Oral, resp. rate 17, height '5\' 2"'$  (1.575 m), weight 53.3 kg, SpO2 98 %.        Intake/Output Summary (Last 24 hours) at 06/15/2022 0722 Last data filed at 06/15/2022 0700 Gross per 24 hour  Intake 9871.1 ml  Output 1100 ml  Net 8771.1 ml   Filed Weights   06/14/22 1048 06/15/22 0500  Weight: 54.4 kg 53.3 kg    Examination: Physical Exam Vitals and nursing note reviewed.  Constitutional:      General: She is not in acute distress.    Appearance: She is ill-appearing.  HENT:     Head: Normocephalic.     Mouth/Throat:     Mouth: Mucous membranes are moist.  Eyes:     Pupils: Pupils are equal, round, and reactive to light.  Cardiovascular:     Rate and Rhythm: Regular rhythm. Tachycardia present.     Pulses: Normal pulses.     Heart sounds: Normal heart sounds.  Pulmonary:     Effort: Pulmonary effort is normal.     Breath sounds: Normal breath sounds.  Abdominal:     General: Abdomen is flat.     Palpations: Abdomen is soft.  Musculoskeletal:     Cervical back: Normal range of  motion.  Skin:    General: Skin is warm.  Neurological:     General: No focal deficit present.     Mental Status: She is alert and oriented to person, place, and time.     Comments: Somnolent and sleepy     Assessment & Plan:   61 year old female with a history of recurrent admissions for DKA recently admitted and left AMA who presents to the hospital for DKA.   Neurology #Toxic Metabolic Encephalopathy   Secondary to drug abuse, severe acidemia and DKA. Significantly improved with improvement in her  metabolic status.   Cardiovascular #Distributive and Hypovolemic Shock   Hypotensive on presentation secondary to severe dehydration in the setting of DKA as well as distributive shock due to severe metabolic acidosis from DKA. Improved with volume resuscitation and improvement in her acid base status. She was peripheral nor-epinephrine with improvement; now off pressors. Continue volume replacement for DKA.  Respiratory #Iatrogenic Pneumothorax #Crack Lung   Developed iatrogenic right apical pneumothorax in the setting of attempted right subclavian central line (see separate procedure note by me 06/14/2022). Pneumothorax is small on the initial chest xray, and oxygen therapy started. This has improved on repeat CXR yesterday and this morning. It is a small and stable pneumothorax for which the management will be conservative. It's small size leaves no safe pocket for chest tube insertion. Patient has underlying lung disease secondary to inhalational injury from cocaine.  -Daily chest xray while admitted -continue oxygen therapy for resorption of PTX   Renal #HyperKalemia #Metabolic Acidosis #Acute Kidney Injury  Life threatening hyperkalemia in the setting of acidemia and AKI. Received calcium gluconate and IV insulin with improvement yesterday, and this morning kidney function is significantly improved and she is now hypokalemic. Will continue to closely monitor potassium and treat as deemed necessary. Has received aggressive volume resuscitation with LR and sodium bicarb given AKI and acidosis. pH normalized last night, now switched back to LR.  -close monitoring of kidney function and potassium -d/c sodium bicarbonate infusion   Endo #DKA  On insulin per DKA protocol. Close monitoring of electrolytes, glucose, and pH. Has been volume resuscitated. -d/c insulin gtt -initiate long and short acting insulin   Hem/Onc   Heparin for prophylaxis   ID Blood cultures drawn. On broad  spectrum antibiotics given acuity of illness. Procalcitonin is elevated, will continue antibiotics pending cultures (UA negative, respiratory panel negative, blood cultures pending).  Best Practice (right click and "Reselect all SmartList Selections" daily)   Diet/type: Regular consistency (see orders) DVT prophylaxis: prophylactic heparin  GI prophylaxis: N/A Lines: N/A Foley:  N/A Code Status:  full code Last date of multidisciplinary goals of care discussion [06/15/2022]  Labs   CBC: Recent Labs  Lab 06/14/22 1053 06/15/22 0338  WBC 20.6* 9.0  NEUTROABS  --  7.6  HGB 11.1* 10.4*  HCT 36.0 28.9*  MCV 109.1* 93.8  PLT 489* 725    Basic Metabolic Panel: Recent Labs  Lab 06/14/22 1053 06/14/22 1346 06/14/22 1551 06/14/22 2109 06/15/22 0115  NA 125* 130* 131* 132* 134*  K 6.2* 4.7 4.4 3.7 3.3*  CL 90* 100 101 99 97*  CO2 <7* <7* <7* 20* 25  GLUCOSE 754* 633* 579* 235* 147*  BUN 42* 39* 39* 36* 32*  CREATININE 1.91* 1.57* 1.50* 1.22* 0.92  CALCIUM 9.4 8.9 8.8* 8.3* 8.0*  MG  --   --  2.2  --  1.9  PHOS  --   --   --   --  2.2*   GFR: Estimated Creatinine Clearance: 49.5 mL/min (by C-G formula based on SCr of 0.92 mg/dL). Recent Labs  Lab 06/14/22 1053 06/14/22 1346 06/14/22 1551 06/15/22 0115 06/15/22 0338  PROCALCITON 4.87  --   --  7.75  --   WBC 20.6*  --   --   --  9.0  LATICACIDVEN 2.7* 2.8* 2.0*  --   --     Liver Function Tests: No results for input(s): "AST", "ALT", "ALKPHOS", "BILITOT", "PROT", "ALBUMIN" in the last 168 hours. No results for input(s): "LIPASE", "AMYLASE" in the last 168 hours. No results for input(s): "AMMONIA" in the last 168 hours.  ABG    Component Value Date/Time   PHART <6.95 (LL) 06/14/2022 1400   PCO2ART <18 (LL) 06/14/2022 1400   PO2ART 111 (H) 06/14/2022 1400   HCO3 15.7 (L) 06/14/2022 1935   ACIDBASEDEF 7.6 (H) 06/14/2022 1935   O2SAT 99.3 06/14/2022 1935     Coagulation Profile: Recent Labs  Lab  06/14/22 1315  INR 1.4*    Cardiac Enzymes: No results for input(s): "CKTOTAL", "CKMB", "CKMBINDEX", "TROPONINI" in the last 168 hours.  HbA1C: Hgb A1c MFr Bld  Date/Time Value Ref Range Status  06/02/2022 03:17 AM 11.7 (H) 4.8 - 5.6 % Final    Comment:    (NOTE) Pre diabetes:          5.7%-6.4%  Diabetes:              >6.4%  Glycemic control for   <7.0% adults with diabetes   01/02/2022 04:27 PM 8.4 (H) 4.8 - 5.6 % Final    Comment:    (NOTE) Pre diabetes:          5.7%-6.4%  Diabetes:              >6.4%  Glycemic control for   <7.0% adults with diabetes     CBG: Recent Labs  Lab 06/15/22 0050 06/15/22 0248 06/15/22 0356 06/15/22 0508 06/15/22 0634  GLUCAP 140* 129* 124* 148* 129*     Past Medical History:  She,  has a past medical history of Alcohol use, Anxiety, Arrhythmia, Arthritis, Chronic alcohol use (07/31/2020), Chronic anticoagulation (07/31/2020), 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   Surgical History:   Past Surgical History:  Procedure Laterality Date   ANKLE FUSION Right 07/30/2020   Procedure: ANKLE FUSION;  Surgeon: Altamese Warsaw, MD;  Location: [place];  Service: Orthopedics;  Laterality: Right;   APPLICATION OF WOUND VAAPPLICATION OF WOUND VAC (Right Ankle)C (Right Ankle)  09/32/3557   APPLICATION OF WOUND VAC Right 07/30/2020   Procedure: APPLICATION OF WOUND VAC;  Surgeon: Altamese Riverton, MD;  Location: [place];  Service: Orthopedics;  Laterality: Right;   HARDWARE REMOVAL Right 07/30/2020   Procedure: HARDWARE REMOVAL RIGHT ANKLE;  Surgeon: Altamese [place], MD;  Location: [place];  Service: Orthopedics;  Laterality: Right;    HARDWARE REMOVAL RIGHT ANKLE (Right Ankle  07/31/2020   INTRAMEDULLARY (IM) NAIL INTERTROCHANTERIC Right 01/04/2022   Procedure: INTRAMEDULLARY (IM) NAIL INTERTROCHANTRIC;  Surgeon: Corky Mull, MD;  Location: [facility];  Service: Orthopedics;  Laterality: Right;   ORIF ANKLE FRACTURE Right 07/04/2020   Procedure: OPEN REDUCTION INTERNAL FIXATION (ORIF) ANKLE FRACTURE;  Surgeon: Lovell Sheehan, MD;  Location: [facility];  Service: Orthopedics;  Laterality: Right;     Social History:   reports that she has been smoking cigarettes, e-cigarettes, and cigars. She has a 41.00 pack-year smoking history. She has never used smokeless tobacco. She reports current alcohol use of about 6.0 standard drinks of alcohol per week. She reports current drug use. Frequency: 7.00 times per week. Drug: Marijuana.   Family History:  Her family history includes CAD in her father; Diabetes in her mother; Heart Problems in her father; Heart failure in her father.   Allergies Allergies  Allergen Reactions   Glipizide     Other reaction(s): Other (See Comments)   Metformin Diarrhea   Codeine Itching   Levaquin [Levofloxacin In D5w] Itching     Home Medications  Prior to Admission medications   Medication Sig Start Date End Date Taking? Authorizing Provider  Cholecalciferol (VITAMIN D) 125 MCG (5000 UT) CAPS Take 1 capsule by mouth daily. 08/03/20  Yes Ainsley Spinner, PA-C  diltiazem (CARDIZEM CD) 180 MG 24 hr capsule Take 1 capsule by mouth once daily 05/12/22  Yes Gollan, Kathlene November, MD  gabapentin (NEURONTIN) 300 MG capsule Take 900 mg by mouth at bedtime. 02/28/20  Yes [provider]  JARDIANCE 10 MG TABS tablet Take 10 mg by mouth daily. 11/25/21  Yes [provider]  LANTUS SOLOSTAR 100 UNIT/ML Solostar Pen Inject 37 Units into the skin daily. This is your home dose of long-acting insulin. 04/20/21  Yes Enzo Bi, MD  Multiple Vitamin (MULTIVITAMIN WITH MINERALS) TABS tablet Take 1 tablet by mouth  daily. 01/07/22  Yes Lorella Nimrod, MD  omeprazole (PRILOSEC) 20 MG capsule Take 20 mg by mouth daily.   Yes [provider]  rivaroxaban (XARELTO) 20 MG TABS tablet Take 1 tablet (20 mg total) by mouth daily with supper. 03/09/19  Yes Gollan, Kathlene November, MD  acetaminophen (TYLENOL) 500 MG tablet Take 1-2 tablets (500-1,000 mg total) by mouth every 6 (six) hours as needed for moderate pain. 01/06/22   Lattie Corns, PA-C  albuterol (PROVENTIL HFA;VENTOLIN HFA) 108 (90 Base) MCG/ACT inhaler Inhale 2 puffs into the lungs every 6 (six) hours as needed for wheezing or shortness of breath. 02/14/18   Pyreddy, Reatha Harps, MD  amLODipine-benazepril (LOTREL) 5-40 MG capsule Take 1 capsule by mouth daily. Patient not taking: Reported on 06/14/2022    [provider]  diphenhydrAMINE (BENADRYL) 12.5 MG/5ML liquid Take 25 mg by mouth 4 (four) times daily as needed for itching.    [provider]  docusate sodium (COLACE) 100 MG capsule Take 1 capsule (100 mg total) by mouth 2 (two) times daily. 01/07/22   Lorella Nimrod, MD  FLUoxetine (PROZAC) 40 MG capsule Take 40 mg by mouth daily. Patient not taking: Reported on 06/14/2022    [provider]  insulin aspart (NOVOLOG) 100 UNIT/ML injection Inject 4 Units into the skin 3 (three) times daily with meals. Short-acting insulin Patient not taking: Reported on 01/16/2022 04/20/21 07/19/21  Enzo Bi, MD     Critical care time: 62 minutes

## 2022-06-16 ENCOUNTER — Inpatient Hospital Stay: Payer: Self-pay

## 2022-06-16 LAB — BASIC METABOLIC PANEL
Anion gap: 7 (ref 5–15)
BUN: 15 mg/dL (ref 8–23)
CO2: 29 mmol/L (ref 22–32)
Calcium: 7.9 mg/dL — ABNORMAL LOW (ref 8.9–10.3)
Chloride: 98 mmol/L (ref 98–111)
Creatinine, Ser: 0.63 mg/dL (ref 0.44–1.00)
GFR, Estimated: >60 mL/min (ref >60)
Glucose, Bld: 359 mg/dL — ABNORMAL HIGH (ref 70–99)
Potassium: 4.2 mmol/L (ref 3.5–5.1)
Sodium: 134 mmol/L — ABNORMAL LOW (ref 135–145)

## 2022-06-16 LAB — GLUCOSE, CAPILLARY
Glucose-Capillary: 322 mg/dL — ABNORMAL HIGH (ref 70–99)
Glucose-Capillary: 255 mg/dL — ABNORMAL HIGH (ref 70–99)
Glucose-Capillary: 158 mg/dL — ABNORMAL HIGH (ref 70–99)
Glucose-Capillary: 125 mg/dL — ABNORMAL HIGH (ref 70–99)

## 2022-06-16 LAB — C DIFFICILE QUICK SCREEN W PCR REFLEX
C Diff antigen: NEGATIVE
C Diff interpretation: NOT DETECTED
C Diff toxin: NEGATIVE

## 2022-06-16 LAB — PHOSPHORUS: Phosphorus: 2.5 mg/dL (ref 2.5–4.6)

## 2022-06-16 LAB — PROCALCITONIN: Procalcitonin: 3.39 ng/mL

## 2022-06-16 LAB — MAGNESIUM: Magnesium: 2.4 mg/dL (ref 1.7–2.4)

## 2022-06-16 MED ORDER — PANTOPRAZOLE SODIUM 40 MG PO TBEC
40.0000 mg | DELAYED_RELEASE_TABLET | Freq: Every day | ORAL | Status: DC
  Administered 2022-06-16 – 2022-06-17 (×2): 40 mg via ORAL
  Filled 2022-06-16 (×2): qty 1

## 2022-06-16 MED ORDER — LOPERAMIDE HCL 2 MG PO CAPS
2.0000 mg | ORAL_CAPSULE | Freq: Two times a day (BID) | ORAL | Status: DC | PRN
  Administered 2022-06-16: 2 mg via ORAL
  Filled 2022-06-16: qty 1

## 2022-06-16 MED ORDER — RIVAROXABAN 20 MG PO TABS
20.0000 mg | ORAL_TABLET | Freq: Every day | ORAL | Status: DC
  Administered 2022-06-16 – 2022-06-17 (×2): 20 mg via ORAL
  Filled 2022-06-16 (×2): qty 1

## 2022-06-16 MED ORDER — GABAPENTIN 300 MG PO CAPS
300.0000 mg | ORAL_CAPSULE | Freq: Three times a day (TID) | ORAL | Status: DC
  Administered 2022-06-16 – 2022-06-17 (×4): 300 mg via ORAL
  Filled 2022-06-16 (×4): qty 1

## 2022-06-16 MED ORDER — DILTIAZEM HCL ER COATED BEADS 180 MG PO CP24
180.0000 mg | ORAL_CAPSULE | Freq: Every day | ORAL | Status: DC
  Administered 2022-06-16 – 2022-06-17 (×2): 180 mg via ORAL
  Filled 2022-06-16 (×3): qty 1

## 2022-06-16 MED ORDER — INSULIN ASPART 100 UNIT/ML IJ SOLN
0.0000 [IU] | Freq: Three times a day (TID) | INTRAMUSCULAR | Status: DC
  Administered 2022-06-16: 11 [IU] via SUBCUTANEOUS
  Administered 2022-06-16: 3 [IU] via SUBCUTANEOUS
  Administered 2022-06-16: 8 [IU] via SUBCUTANEOUS
  Administered 2022-06-17: 11 [IU] via SUBCUTANEOUS
  Administered 2022-06-17 (×2): 8 [IU] via SUBCUTANEOUS
  Filled 2022-06-16 (×5): qty 1

## 2022-06-16 MED ORDER — INSULIN GLARGINE-YFGN 100 UNIT/ML ~~LOC~~ SOLN
20.0000 [IU] | Freq: Every day | SUBCUTANEOUS | Status: DC
  Administered 2022-06-16 – 2022-06-17 (×2): 20 [IU] via SUBCUTANEOUS
  Filled 2022-06-16 (×2): qty 0.2

## 2022-06-16 NOTE — Progress Notes (Signed)
Pt to d/c to 1c-127 per MD order, report given to Janae Bridgeman, RN. All belongings sent with pt. VSS at this time. Pt being transport by NT.

## 2022-06-16 NOTE — TOC Initial Note (Signed)
Transition of Care [facility]) - Initial/Assessment Note    Patient Details  Name: Michaela Johnson MRN: [id number] Date of Birth: January 01, 1961  Transition of Care [facility]) CM/SW Contact:    Shelbie Hutching, RN Phone Number: 06/16/2022, 11:56 AM  Clinical Narrative:                 High risk readmission assessment completed.  Patient left AMA last admission, she said she was worried about her cats and needed to get home.  She has no questions or concerns and nothing has changed.  She reports not having any trouble getting her medications, she reports she is taking her medications.  [facility] to [facility] for [facility], she drives.  At discharge she says she will get a taxi home.   Expected Discharge Plan: Home/Self Care Barriers to Discharge: Continued Medical Work up   Patient Goals and CMS Choice Patient states their goals for this hospitalization and ongoing recovery are:: no questions, wants to go home      Expected Discharge Plan and Services Expected Discharge Plan: Home/Self Care   Discharge Planning Services: CM Consult   Living arrangements for the past 2 months: Apartment                 DME Arranged: N/A DME Agency: NA       HH Arranged: NA [place] Agency: NA        Prior Living Arrangements/Services Living arrangements for the past 2 months: Apartment Lives with:: Self Patient language and need for interpreter reviewed:: Yes Do you feel safe going back to the place where you live?: Yes      Need for Family Participation in Patient Care: Yes (Comment) Care giver support system in place?: Yes (comment)   Criminal Activity/Legal Involvement Pertinent to Current Situation/Hospitalization: No - Comment as needed  Activities of Daily Living Home Assistive Devices/Equipment: None ADL Screening (condition at time of admission) Patient's cognitive ability adequate to safely complete daily activities?: Yes Is the patient deaf or have difficulty hearing?: No Does the patient have  difficulty seeing, even when wearing glasses/contacts?: No Does the patient have difficulty concentrating, remembering, or making decisions?: Yes Patient able to express need for assistance with ADLs?: Yes Does the patient have difficulty dressing or bathing?: No Independently performs ADLs?: No Does the patient have difficulty walking or climbing stairs?: Yes Weakness of Legs: Both Weakness of Arms/Hands: None  Permission Sought/Granted                  Emotional Assessment Appearance:: Appears older than stated age Attitude/Demeanor/Rapport: Engaged Affect (typically observed): Accepting Orientation: : Oriented to Self, Oriented to Place, Oriented to  Time, Oriented to Situation Alcohol / Substance Use: Illicit Drugs Psych Involvement: No (comment)  Admission diagnosis:  Diabetic ketoacidosis ([place]) [E11.10] Diabetic ketoacidosis without coma associated with type 1 diabetes mellitus ([place]) [E10.10] Patient Active Problem List   Diagnosis Date Noted   Metabolic acidosis 42/70/6237   Iatrogenic pneumothorax 06/14/2022   History of GI bleed 06/03/2022   Cirrhosis of liver ([place]) 06/03/2022   Cocaine use 06/03/2022   Diabetic ketoacidosis ([place]) 06/01/2022   Smoker 05/01/2022   Marijuana use 05/01/2022   Depression, major, single episode, complete remission ([place]) dx'd 15 years ago 05/01/2022   Acute postoperative anemia due to expected blood loss 01/05/2022   Closed right radial fracture 01/03/2022   Closed intertrochanteric fracture of hip, right, initial encounter ([place]) 01/02/2022   Acute kidney injury ([place]) 04/18/2021   Hyperkalemia  04/18/2021   Multiple lung nodules on CT 04/18/2021   Vitamin D deficiency    Neuropathy    Chronic anticoagulation 07/31/2020   Chronic alcohol use 07/31/2020   Nicotine dependence 07/31/2020   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: [phone]: [phone] [address] [place] [address] [address]Phone: [phone]: [address]Phone: [phone]: [phone]    Social Determinants of Health ([place] Interventions    Readmission Risk Interventions    06/16/2022   11:54 AM 06/04/2022    8:58 AM  Readmission Risk Prevention Plan  Transportation Screening Complete Complete  PCP or Specialist Appt within 5-7 Days  Complete  Home Care Screening  Complete  Medication Review (RN CM)  Complete  Medication Review ([place] Complete   PCP or Specialist appointment within 3-5 days of discharge Complete   HRI or H[place]Complete   SW Recovery Care/Counseling Consult Not Complete   SW Consult Not Complete Comments NA   Palliative Care Screening Not ATwin LakesNot Applicable

## 2022-06-16 NOTE — Inpatient Diabetes Management (Signed)
Inpatient Diabetes Program Recommendations  AACE/ADA: New Consensus Statement on Inpatient Glycemic Control (2015)  Target Ranges:  Prepandial:   less than 140 mg/dL      Peak postprandial:   less than 180 mg/dL (1-2 hours)      Critically ill patients:  140 - 180 mg/dL   Lab Results  Component Value Date   GLUCAP 322 (H) 06/16/2022   HGBA1C 11.7 (H) 06/02/2022    Review of Glycemic Control  Diabetes history: DM2 Outpatient Diabetes medications: Lantus 36 units daily, Novolog 2 (?) units TID, Jardiance 10 mg daily  Current orders for Inpatient glycemic control: Semglee 20 units (increased from 10 units given 11/26), Novolog 3 units tid meal coverage, Novolog 0-15 units tid correction  Inpatient Diabetes Program Recommendations:   Diabetes coordinator spoke with patient regarding insulin and diabetes management on last admission 06/04/22 and reviewed with patient need to check CBGs and take insulin as prescribed. Will follow during hospitalization and assist as needed.  Thank you, Nani Gasser. Cabela Pacifico, RN, MSN, CDE  Diabetes Coordinator Inpatient Glycemic Control Team Team Pager [phone] (8am-5pm) 06/16/2022 9:01 AM

## 2022-06-16 NOTE — Plan of Care (Signed)

## 2022-06-16 NOTE — Progress Notes (Signed)
  PROGRESS NOTE    Michaela Johnson  MVE:[id number] DOB: April 27, 1961 DOA: 06/14/2022 PCP: [name], NP  IC12A/IC12A-AA  LOS: 2 days   Brief hospital course:   Assessment & Plan: Michaela Johnson is a 61 y.o.  female with medical history significant for recurrent DKA (last hospitalization from 06/01/22 to 06/04/2022 with DKA and left AMA), substance abuse, who presented with AMS.    Pt was admitted to ICU with acute toxic metabolic encephalopathy; hypotension requiring Levophed gtt and DKA requiring insulin gtt.  Pt was started on empiric broad spectrum abx, however, workup revealed no source of infection, abx d/c'ed on 11/27.  # Acute Toxic Metabolic Encephalopathy Secondary to drug abuse, severe acidemia and DKA.  Resolved.   #DKA # recurrent hospitalizations for DKA likely due to non-compliance --received insulins gtt per protocol and transitioned to subQ insulin.  DM2, poorly controlled  With hyperglycemia --A1c 11.7 --cont glargine as 20u daily --mealtime 3u TID --ACHS and SSI  #Distributive and Hypovolemic Shock Hypotensive on presentation secondary to severe dehydration in the setting of DKA as well as distributive shock due to severe metabolic acidosis from DKA. Improved with volume resuscitation and off pressors.   Acute hypoxemic respiratory failure #Crack Lung --Patient has underlying lung disease secondary to inhalational injury from cocaine.  Currently on 6L O2 --Continue supplemental O2 to keep sats >=90%, wean as tolerated  #Iatrogenic Pneumothorax Developed iatrogenic right apical pneumothorax in the setting of attempted right subclavian central line (see separate procedure note by me 06/14/2022). Pneumothorax small and stable.  #Acute Kidney Injury #Metabolic Acidosis --Cr 2.83 on presentation.  AKI resolved after IVF, Cr back to baseline 0.63. --received sodium bicarb infusion.  #HyperKalemia Presented with Life threatening hyperkalemia in the setting of  acidemia and AKI. Received calcium gluconate and IV insulin with improvement.  Hypokalemia Hypophos --monitor and replete PRN  Cocaine abuse --UDS pos for cocaine  HTN --resume home cardizem today  Sepsis ruled out    DVT prophylaxis: [id number]  Code Status: Full code  Family Communication:  Level of care: Telemetry Medical Dispo:   The patient is from: home Anticipated d/c is to: home Anticipated d/c date is: 1-2 days   Subjective and Interval History:  Pt ate.  Complained of some diarrhea.     Objective: Vitals:   06/16/22 1400 06/16/22 1500 06/16/22 1600 06/16/22 1700  BP: 137/83 136/77 (!) 132/90 (!) 149/93  Pulse: (!) 103 (!) 101 98 100  Resp: (!) 23 (!) '26 18 19  '$ Temp:   98.1 F (36.7 C)   TempSrc:   Oral   SpO2: 98% 97% 99% 99%  Weight:      Height:        Intake/Output Summary (Last 24 hours) at 06/16/2022 1717 Last data filed at 06/16/2022 1100 Gross per 24 hour  Intake 1684.07 ml  Output 1775 ml  Net -90.93 ml   Filed Weights   06/14/22 1048 06/15/22 0500  Weight: 54.4 kg 53.3 kg    Examination:   Constitutional: NAD, AAOx3 HEENT: conjunctivae and lids normal, EOMI CV: No cyanosis.   RESP: normal respiratory effort Neuro: II - XII grossly intact.   Psych: Normal mood and affect.     Data Reviewed: I have personally reviewed labs and imaging studies  Time spent: 50 minutes  Enzo Bi, MD [facility] If 7PM-7AM, please contact night-coverage 06/16/2022, 5:17 PM

## 2022-06-17 DIAGNOSIS — E1110 Type 2 diabetes mellitus with ketoacidosis without coma: Secondary | ICD-10-CM

## 2022-06-17 LAB — CBC
HCT: 30.7 % — ABNORMAL LOW (ref 36.0–46.0)
Hemoglobin: 10.5 g/dL — ABNORMAL LOW (ref 12.0–15.0)
MCH: 34 pg (ref 26.0–34.0)
MCHC: 34.2 g/dL (ref 30.0–36.0)
MCV: 99.4 fL (ref 80.0–100.0)
Platelets: 283 10*3/uL (ref 150–400)
RBC: 3.09 MIL/uL — ABNORMAL LOW (ref 3.87–5.11)
RDW: 12.5 % (ref 11.5–15.5)
WBC: 7.9 10*3/uL (ref 4.0–10.5)
nRBC: 0 % (ref 0.0–0.2)

## 2022-06-17 LAB — BASIC METABOLIC PANEL
Anion gap: 6 (ref 5–15)
BUN: 14 mg/dL (ref 8–23)
CO2: 30 mmol/L (ref 22–32)
Calcium: 8.6 mg/dL — ABNORMAL LOW (ref 8.9–10.3)
Chloride: 103 mmol/L (ref 98–111)
Creatinine, Ser: 0.69 mg/dL (ref 0.44–1.00)
GFR, Estimated: >60 mL/min (ref >60)
Glucose, Bld: 190 mg/dL — ABNORMAL HIGH (ref 70–99)
Potassium: 4 mmol/L (ref 3.5–5.1)
Sodium: 139 mmol/L (ref 135–145)

## 2022-06-17 LAB — GLUCOSE, CAPILLARY
Glucose-Capillary: 285 mg/dL — ABNORMAL HIGH (ref 70–99)
Glucose-Capillary: 290 mg/dL — ABNORMAL HIGH (ref 70–99)
Glucose-Capillary: 307 mg/dL — ABNORMAL HIGH (ref 70–99)

## 2022-06-17 LAB — MAGNESIUM: Magnesium: 2.3 mg/dL (ref 1.7–2.4)

## 2022-06-17 MED ORDER — INSULIN GLARGINE-YFGN 100 UNIT/ML ~~LOC~~ SOLN
10.0000 [IU] | Freq: Once | SUBCUTANEOUS | Status: AC
  Administered 2022-06-17: 10 [IU] via SUBCUTANEOUS
  Filled 2022-06-17: qty 0.1

## 2022-06-17 MED ORDER — INSULIN GLARGINE-YFGN 100 UNIT/ML ~~LOC~~ SOLN: 30.0000 [IU] | Freq: Every day | SUBCUTANEOUS | Status: DC

## 2022-06-17 NOTE — Discharge Instructions (Signed)
                  Intensive Outpatient Programs  [facility]    [facility] [address]     [address] [place],  [place]     [place], [place] [phone]      [phone]  [facility]   [facility]  (Inpatient and outpatient)  [phone] (Suboxone and Methadone) [address]           [phone]           [facility]    [facility] - Intensive Outpatient [address]     [address] [place], [place] [address]     [place], [place]  [phone]      [phone]  [facility] (Outpatient, Inpatient, [facility]  [facility] (Groups and Residental) (insurance only) [phone]    [place], [place]          [phone]       [facility]    [facility] (for caregivers and family) [address]     [address] [place], [place]     [place], [place] [phone]      [phone]  Residential Treatment Programs  [facility]  [facility](2 years) Residential: 90 days)  [facility] [address]      [address] [place], [place]     [place], [place] [phone]      [phone] or [phone]  [facility]    [facility] [address]      [address] [place], [place]     [place], [place] [phone]      [phone]  [facility]   [facility] ([facility] [address]     [address] [place], [place] [address]     [place], [place] [phone]      [phone] Admissions: 8am-3pm M-F  [facility]: Residential Program (90 Days)              [facility]: [facility]  [place],      [place],   [phone] or [phone]    (Walk in Hours over the weekend or by referral)   [place]: [facility]:[phone] (for crisis  response 24 hours a day) 

## 2022-06-17 NOTE — Discharge Summary (Incomplete)
Physician Discharge Summary   Michaela Johnson  female DOB: 02/12/1961  XBM:[id number]  PCP: [name], NP  Admit date: 06/14/2022 Discharge date: 06/17/2022  Admitted From: home Disposition:  home CODE STATUS: Full code  Discharge Instructions     Diet Carb Modified   Complete by: As directed       Hospital Course:  For full details, please see H&P, progress notes, consult notes and ancillary notes.  Briefly,  Michaela Johnson is a 61 y.o.  female with medical history significant for recurrent DKA (last hospitalization from 06/01/22 to 06/04/2022 with DKA and left AMA), substance abuse, who presented with AMS.     Pt was admitted to ICU with acute toxic metabolic encephalopathy; hypotension requiring Levophed gtt and DKA requiring insulin gtt.  Pt was started on empiric broad spectrum abx, however, workup revealed no source of infection, abx d/c'ed on 11/27.   # Acute Toxic Metabolic Encephalopathy Secondary to drug abuse, severe acidemia and DKA.  Resolved.   #DKA # recurrent hospitalizations for DKA likely due to non-compliance --received insulins gtt per protocol and transitioned to subQ insulin.   DM2, poorly controlled  With hyperglycemia --A1c 11.7 --pt reported she only takes Lantus 37 ut daily, and no short-acting insulin.   --f/u with outpatient provider   #Distributive and Hypovolemic Shock Hypotensive on presentation secondary to severe dehydration in the setting of DKA as well as distributive shock due to severe metabolic acidosis from DKA. Improved with volume resuscitation and off pressors.   Acute hypoxemic respiratory failure #Crack Lung --Patient has underlying lung disease secondary to inhalational injury from cocaine.  was on 6L O2, and weaned down to RA prior to discharge.   #Iatrogenic Pneumothorax Developed iatrogenic right apical pneumothorax in the setting of attempted right subclavian central line. Pneumothorax small and stable.   #Acute  Kidney Injury #Metabolic Acidosis --Cr 0.27 on presentation.  AKI resolved after IVF, Cr back to baseline 0.63. --received sodium bicarb infusion.   #HyperKalemia Potassium 6.2 on presentation, in the setting of acidemia and AKI. Received calcium gluconate and IV insulin with normalization.   Hypokalemia Hypophos --monitored and repleted PRN   Cocaine abuse --UDS pos for cocaine   HTN --home cardizem resumed prior to discharge. --Pt was not taking Lotrel PTA.   Sepsis ruled out   Discharge Diagnoses:  Principal Problem:   Diabetic ketoacidosis ([place]) Active Problems:   Acute kidney injury ([place])   Hyperkalemia   Metabolic acidosis   Iatrogenic pneumothorax   30 Day Unplanned Readmission Risk Score    Flowsheet Row ED to Hosp-Admission (Current) from 06/14/2022 in [facility]  30 Day Unplanned Readmission Risk Score (%) 31.21 Filed at 06/17/2022 1200       This score is the patient's risk of an unplanned readmission within 30 days of being discharged (0 -100%). The score is based on dignosis, age, lab data, medications, orders, and past utilization.   Low:  0-14.9   Medium: 15-21.9   High: 22-29.9   Extreme: 30 and above         Discharge Instructions:  Allergies as of 06/17/2022       Reactions   Glipizide    Other reaction(s): Other (See Comments)   Metformin Diarrhea   Codeine Itching   Levaquin [levofloxacin In D5w] Itching        Medication List     STOP taking these medications    amLODipine-benazepril 5-40 MG capsule Commonly known as: LOTREL  FLUoxetine 40 MG capsule Commonly known as: PROZAC   insulin aspart 100 UNIT/ML injection Commonly known as: novoLOG       TAKE these medications    acetaminophen 500 MG tablet Commonly known as: TYLENOL Take 1-2 tablets (500-1,000 mg total) by mouth every 6 (six) hours as needed for moderate pain.   albuterol 108 (90 Base) MCG/ACT  inhaler Commonly known as: VENTOLIN HFA Inhale 2 puffs into the lungs every 6 (six) hours as needed for wheezing or shortness of breath.   diltiazem 180 MG 24 hr capsule Commonly known as: CARDIZEM CD Take 1 capsule by mouth once daily   diphenhydrAMINE 12.5 MG/5ML liquid Commonly known as: BENADRYL Take 25 mg by mouth 4 (four) times daily as needed for itching.   docusate sodium 100 MG capsule Commonly known as: COLACE Take 1 capsule (100 mg total) by mouth 2 (two) times daily.   gabapentin 300 MG capsule Commonly known as: NEURONTIN Take 900 mg by mouth at bedtime.   Jardiance 10 MG Tabs tablet Generic drug: empagliflozin Take 10 mg by mouth daily.   Lantus SoloStar 100 UNIT/ML Solostar Pen Generic drug: insulin glargine Inject 37 Units into the skin daily. This is your home dose of long-acting insulin.   multivitamin with minerals Tabs tablet Take 1 tablet by mouth daily.   omeprazole 20 MG capsule Commonly known as: PRILOSEC Take 20 mg by mouth daily.   rivaroxaban 20 MG Tabs tablet Commonly known as: XARELTO Take 1 tablet (20 mg total) by mouth daily with supper.   Vitamin D 125 MCG (5000 UT) Caps Take 1 capsule by mouth daily.         Follow-up Information     [name], NP Follow up in 1 week(s).   Specialty: Nurse Practitioner Contact information: [address] [place] [address] [phone]                 Allergies  Allergen Reactions   Glipizide     Other reaction(s): Other (See Comments)   Metformin Diarrhea   Codeine Itching   Levaquin [Levofloxacin In D5w] Itching     The results of significant diagnostics from this hospitalization (including imaging, microbiology, ancillary and laboratory) are listed below for reference.   Consultations:   Procedures/Studies: DG Chest Port 1 View  Result Date: 06/16/2022 CLINICAL DATA:  61 year old female with history of pneumothorax. EXAM: PORTABLE CHEST 1 VIEW  COMPARISON:  Chest x-ray 06/15/2022. FINDINGS: Small right-sided pneumothorax most evident in the apex, occupying 5-10% of the volume of the right hemithorax, stable to slightly decreased in size compared to the recent prior study. No left-sided pneumothorax noted. Widespread areas of interstitial prominence and peribronchial cuffing throughout the lungs bilaterally, similar to prior examinations, favored to largely reflect underlying chronic lung disease, although the possibility of bronchitis and developing multilobar bilateral bronchopneumonia is not excluded. No pleural effusions. No evidence of pulmonary edema. Heart size is normal. Upper mediastinal contours are within normal limits allowing for patient rotation to the right. Atherosclerotic calcifications are noted in the thoracic aorta. IMPRESSION: 1. Small right-sided pneumothorax appears unchanged to slightly decreased compared to the recent prior examination. 2. Chronically increased lung markings again noted, likely reflective of underlying interstitial lung disease, although the possibility of superimposed bronchitis and developing multilobar bronchopneumonia is not excluded. Further clinical evaluation is recommended. 3. Aortic atherosclerosis. Electronically Signed   By: Vinnie Langton M.D.   On: 06/16/2022 06:33   DG Chest [facility]  Result Date: 06/15/2022 CLINICAL DATA:  Right pneumothorax. EXAM: PORTABLE CHEST 1 VIEW COMPARISON:  June 14, 2022. FINDINGS: Stable cardiomediastinal silhouette. Mild right apical pneumothorax is noted which is slightly increased in size compared to prior exam. Increased left upper lobe opacity is noted concerning for developing pneumonia. Minimal bilateral pleural effusions are noted. Bony thorax is unremarkable. IMPRESSION: Mild right apical pneumothorax is noted which is slightly increased in size compared to prior exam. Increased left upper lobe opacity is noted concerning for developing pneumonia.  Minimal bilateral pleural effusions may be present. Electronically Signed   By: Marijo Conception M.D.   On: 06/15/2022 08:47   DG Chest Port 1 View  Result Date: 06/14/2022 CLINICAL DATA:  Follow-up pneumothorax EXAM: PORTABLE CHEST 1 VIEW COMPARISON:  Radiographs earlier today FINDINGS: Slight decrease in size of the small right apical pneumothorax. Remainder unchanged. Stable cardiomediastinal silhouette. Aortic atherosclerotic calcification. Diffuse coarse interstitial opacities. Trace pleural effusion or pleural thickening in the right costophrenic angle. Defibrillator pads. No acute osseous abnormality. Remote right rib fractures. Chronic posttraumatic deformity left clavicle. IMPRESSION: Decreased size of the small right apical pneumothorax. Similar diffuse bilateral interstitial opacities. Electronically Signed   By: Placido Sou M.D.   On: 06/14/2022 19:27   DG Chest Port 1 View  Result Date: 06/14/2022 CLINICAL DATA:  Right pneumothorax EXAM: PORTABLE CHEST 1 VIEW COMPARISON:  Chest x-ray dated June 14, 2022 FINDINGS: Cardiac and mediastinal contours are within normal limits. Small right apical pneumothorax, similar in size when compared with prior exam. Mild diffuse heterogeneous opacities. No large pleural effusion. IMPRESSION: Small right apical pneumothorax, similar in size when compared with prior exam. Electronically Signed   By: Yetta Glassman M.D.   On: 06/14/2022 16:59   DG Chest Port 1 View  Result Date: 06/14/2022 CLINICAL DATA:  Dyspnea EXAM: PORTABLE CHEST 1 VIEW COMPARISON:  06/14/2022, 11:30 a.m. FINDINGS: Rotated AP portable examination. Diffuse bilateral interstitial opacity, somewhat worsened compared to prior examination. Lucency of the right pulmonary apex, new and suspicious for a small pneumothorax, the pleural margin however not clearly visualized. Heart and mediastinum unremarkable. Chronic, elevated fracture of the mid left clavicle, unchanged. IMPRESSION: 1.  Lucency of the right pulmonary apex, new and suspicious for a small pneumothorax, the pleural margin however not clearly visualized. If present, pneumothorax is no greater than 15% volume. 2. Diffuse bilateral interstitial opacity, somewhat worsened compared to prior examination, consistent with edema and/or infection. No focal airspace opacity. These results will be called to the ordering clinician or representative by the Radiologist Assistant, and communication documented in the PACS or [employer]. Electronically Signed   By: Delanna Ahmadi M.D.   On: 06/14/2022 13:24   DG Chest Port 1 View  Result Date: 06/14/2022 CLINICAL DATA:  Questionable sepsis. EXAM: PORTABLE CHEST 1 VIEW COMPARISON:  06/03/2022 FINDINGS: Stable cardiomediastinal contours. No signs of pleural effusion. Mild diffuse interstitial scratch set there is mildly increased interstitial markings throughout both lungs. No airspace consolidation, atelectasis or pneumothorax. Remote fracture deformity involving the left clavicle is noted. There is also remote appearing right posterior fifth rib fracture. IMPRESSION: Mild increased interstitial markings throughout both lungs mild interstitial edema versus atypical infection. Electronically Signed   By: Kerby Moors M.D.   On: 06/14/2022 11:44   DG Chest Port 1 View  Result Date: 06/03/2022 CLINICAL DATA:  CAP (community acquired pneumonia) EXAM: PORTABLE CHEST 1 VIEW COMPARISON:  Chest x-ray June 01, 2021. FINDINGS: Similar versus slightly improved mild bilateral interstitial opacities.  No confluent consolidation. No visible pleural effusions or pneumothorax. No evidence of acute osseous abnormality. IMPRESSION: Similar versus slightly improved mild bilateral interstitial opacities. Electronically Signed   By: Margaretha Sheffield M.D.   On: 06/03/2022 09:22   CT CHEST ABDOMEN PELVIS WO CONTRAST  Result Date: 06/01/2022 CLINICAL DATA:  61 year old female with history of blunt  trauma. EXAM: CT CHEST, ABDOMEN AND PELVIS WITHOUT CONTRAST TECHNIQUE: Multidetector CT imaging of the chest, abdomen and pelvis was performed following the standard protocol without IV contrast. RADIATION DOSE REDUCTION: This exam was performed according to the departmental dose-optimization program which includes automated exposure control, adjustment of the mA and/or kV according to patient size and/or use of iterative reconstruction technique. COMPARISON:  CT of the chest, abdomen and pelvis 04/18/2021. FINDINGS: Comment: Portions of today's examination are severely limited by considerable patient respiratory motion. CT CHEST FINDINGS Cardiovascular: Heart size is normal. There is no significant pericardial fluid, thickening or pericardial calcification. There is aortic atherosclerosis, as well as atherosclerosis of the great vessels of the mediastinum and the coronary arteries, including calcified atherosclerotic plaque in the left main, left anterior descending and right coronary arteries. Mediastinum/Nodes: No pathologically enlarged mediastinal or hilar lymph nodes. Please note that accurate exclusion of hilar adenopathy is limited on noncontrast CT scans. Esophagus is unremarkable in appearance. No axillary lymphadenopathy. Lungs/Pleura: Evaluation of the lungs is limited by considerable patient respiratory motion. With these limitations in mind, there is widespread areas of ground-glass attenuation, septal thickening and some subpleural reticulation, most evident in the mid to lower lungs bilaterally, new compared to the prior study. No confluent consolidative airspace disease. No pleural effusions. No definite suspicious appearing pulmonary nodules or masses are noted. No pneumothorax. Musculoskeletal: Old fracture of the lateral aspect of the left clavicle with chronic posttraumatic deformity and probable fibrous union, similar to the remote prior examination. No definite acute displaced fractures or  aggressive appearing lytic or blastic lesions are noted in the visualized portions of the skeleton on today's examination which is limited by considerable patient respiratory motion. CT ABDOMEN PELVIS FINDINGS Hepatobiliary: Diffuse low attenuation throughout the hepatic parenchyma, indicative of a background of hepatic steatosis. No discrete cystic or solid hepatic lesions are confidently identified on today's noncontrast CT examination. Gallbladder is nearly completely decompressed, but otherwise unremarkable in appearance. Pancreas: No definite pancreatic mass or peripancreatic fluid collections or inflammatory changes confidently identified on today's noncontrast CT examination. Spleen: Unremarkable. Adrenals/Urinary Tract: Unenhanced appearance of the kidneys and bilateral adrenal glands is unremarkable. No hydroureteronephrosis. Urinary bladder is completely decompressed around an indwelling Foley balloon catheter. Small amount of gas non dependently in the urinary bladder is presumably iatrogenic. Stomach/Bowel: Unenhanced appearance of the stomach is unremarkable. No pathologic dilatation of small bowel or colon. The appendix is not confidently identified and may be surgically absent. Regardless, there are no inflammatory changes noted adjacent to the cecum to suggest the presence of an acute appendicitis at this time. Vascular/Lymphatic: Atherosclerotic calcifications throughout the abdominal aorta and pelvic vasculature. Right femoral central venous catheter with tip terminating in the right common iliac vein. Reproductive: Uterus and ovaries are atrophic. Other: Small volume of ascites.  No pneumoperitoneum. Musculoskeletal: Postoperative changes of ORIF are noted in the right proximal femur. Well-defined sclerotic lesion with narrow zone of transition in the right ilium (axial image 99 of series 5) measuring 1.5 x 1.1 cm, similar to the prior study, presumably a large bone island. There are no aggressive  appearing lytic or blastic lesions noted in  the visualized portions of the skeleton. IMPRESSION: 1. No definitive findings to suggest significant acute traumatic injury to the chest, abdomen or pelvis on today's noncontrast CT examination which is limited by considerable patient respiratory motion. 2. There is a small volume of ascites, which is nonspecific. 3. Severe diffuse low attenuation throughout the hepatic parenchyma, indicative of a background of hepatic steatosis. 4. The appearance of the lungs is concerning for developing interstitial lung disease. Outpatient referral to Pulmonology for further clinical evaluation is recommended. Follow-up nonemergent high-resolution chest CT should also be considered in 6-12 months to assess for temporal changes in the appearance of the lung parenchyma. 5. Aortic atherosclerosis, in addition to left main and 2 vessel coronary artery disease. Please note that although the presence of coronary artery calcium documents the presence of coronary artery disease, the severity of this disease and any potential stenosis cannot be assessed on this non-gated CT examination. Assessment for potential risk factor modification, dietary therapy or pharmacologic therapy may be warranted, if clinically indicated. 6. Additional incidental findings, as above. Electronically Signed   By: Vinnie Langton M.D.   On: 06/01/2022 06:53   CT Head Wo Contrast  Result Date: 06/01/2022 CLINICAL DATA:  61 year old female with history of blunt trauma. EXAM: CT HEAD WITHOUT CONTRAST CT CERVICAL SPINE WITHOUT CONTRAST TECHNIQUE: Multidetector CT imaging of the head and cervical spine was performed following the standard protocol without intravenous contrast. Multiplanar CT image reconstructions of the cervical spine were also generated. RADIATION DOSE REDUCTION: This exam was performed according to the departmental dose-optimization program which includes automated exposure control, adjustment of  the mA and/or kV according to patient size and/or use of iterative reconstruction technique. COMPARISON:  No priors. FINDINGS: CT HEAD FINDINGS Brain: Mild cerebral atrophy. Patchy and confluent areas of decreased attenuation are noted throughout the deep and periventricular white matter of the cerebral hemispheres bilaterally, compatible with chronic microvascular ischemic disease. No evidence of acute infarction, hemorrhage, hydrocephalus, extra-axial collection or mass lesion/mass effect. Vascular: No hyperdense vessel or unexpected calcification. Skull: Normal. Negative for fracture or focal lesion. Sinuses/Orbits: No acute finding. Other: None. CT CERVICAL SPINE FINDINGS Alignment: Normal. Skull base and vertebrae: No acute fracture. No primary bone lesion or focal pathologic process. Soft tissues and spinal canal: No prevertebral fluid or swelling. No visible canal hematoma. Disc levels: Multilevel degenerative disc disease, most pronounced at C5-C6 and C6-C7. Moderate multilevel facet arthropathy bilaterally. Upper chest: Please see separate dictation for contemporaneously obtained CT chest, abdomen and pelvis. Other: None. IMPRESSION: 1. No evidence of significant acute traumatic injury to the skull, brain or cervical spine. 2. Mild cerebral atrophy with extensive chronic microvascular ischemic changes in the cerebral white matter, as above. 3. Mild multilevel degenerative disc disease and cervical spondylosis, as above. Electronically Signed   By: Vinnie Langton M.D.   On: 06/01/2022 06:45   CT Cervical Spine Wo Contrast  Result Date: 06/01/2022 CLINICAL DATA:  61 year old female with history of blunt trauma. EXAM: CT HEAD WITHOUT CONTRAST CT CERVICAL SPINE WITHOUT CONTRAST TECHNIQUE: Multidetector CT imaging of the head and cervical spine was performed following the standard protocol without intravenous contrast. Multiplanar CT image reconstructions of the cervical spine were also generated.  RADIATION DOSE REDUCTION: This exam was performed according to the departmental dose-optimization program which includes automated exposure control, adjustment of the mA and/or kV according to patient size and/or use of iterative reconstruction technique. COMPARISON:  No priors. FINDINGS: CT HEAD FINDINGS Brain: Mild cerebral atrophy. Patchy and confluent  areas of decreased attenuation are noted throughout the deep and periventricular white matter of the cerebral hemispheres bilaterally, compatible with chronic microvascular ischemic disease. No evidence of acute infarction, hemorrhage, hydrocephalus, extra-axial collection or mass lesion/mass effect. Vascular: No hyperdense vessel or unexpected calcification. Skull: Normal. Negative for fracture or focal lesion. Sinuses/Orbits: No acute finding. Other: None. CT CERVICAL SPINE FINDINGS Alignment: Normal. Skull base and vertebrae: No acute fracture. No primary bone lesion or focal pathologic process. Soft tissues and spinal canal: No prevertebral fluid or swelling. No visible canal hematoma. Disc levels: Multilevel degenerative disc disease, most pronounced at C5-C6 and C6-C7. Moderate multilevel facet arthropathy bilaterally. Upper chest: Please see separate dictation for contemporaneously obtained CT chest, abdomen and pelvis. Other: None. IMPRESSION: 1. No evidence of significant acute traumatic injury to the skull, brain or cervical spine. 2. Mild cerebral atrophy with extensive chronic microvascular ischemic changes in the cerebral white matter, as above. 3. Mild multilevel degenerative disc disease and cervical spondylosis, as above. Electronically Signed   By: Vinnie Langton M.D.   On: 06/01/2022 06:45   DG Wrist Complete Right  Result Date: 06/01/2022 CLINICAL DATA:  61 year old female found down by neighbor. Last known well 20 hours ago. Hyperglycemia. EXAM: RIGHT WRIST - COMPLETE 3+ VIEW COMPARISON:  Right wrist series 06/15 - 01/07/2022. FINDINGS:  Comminuted distal radius and ulna fractures in June. Distal radius and ulna deformities today appear to be the un healed injury from June. Mild displacement and dorsal angulation. Carpal bone alignment maintained. Metacarpals appear stable and intact. Incidental thenar area intravenous access. IMPRESSION: 1. Un-healed distal radius and ulna fractures since June. Comminution and dorsal impaction not significantly changed. 2. No new osseous abnormality identified. Electronically Signed   By: Genevie Ann M.D.   On: 06/01/2022 06:27   DG Abd Portable 1 View  Result Date: 06/01/2022 CLINICAL DATA:  61 year old female found down by neighbor. Last known well 20 hours ago. Hyperglycemia. EXAM: PORTABLE ABDOMEN - 1 VIEW COMPARISON:  CT Abdomen and Pelvis 04/18/2021. Abdominal and pelvis radiographs 06/07/2016. Intraoperative right hip images [id number]. FINDINGS: Partially visible right femur ORIF hardware, visualized portion stable. Femoral heads normally located. Pelvis appears stable and intact. Grossly intact proximal left femur. Midline pelvic catheter in place, likely a Foley. Right femoral vascular catheter in place, tip projects over the sacral ala. Negative visible bowel gas pattern. Visible lumbar spine appears stable. IMPRESSION: 1. Right femoral vascular catheter and midline probable Foley catheter in place. 2.  No acute fracture or dislocation identified about the pelvis. 3.  Non obstructed bowel gas pattern. Electronically Signed   By: Genevie Ann M.D.   On: 06/01/2022 06:25   DG Chest Port 1 View  Result Date: 06/01/2022 CLINICAL DATA:  61 year old female found down by neighbor. Last known well 20 hours ago. Hyperglycemia. EXAM: PORTABLE CHEST 1 VIEW COMPARISON:  Portable chest 01/02/2022 and earlier. FINDINGS: Portable AP supine view at 0553 hours. Mildly lower lung volumes. Mediastinal contours remain within normal limits. Pacer or resuscitation pads project over the chest and abdomen. Mild diffuse increased  pulmonary interstitial opacity. No pneumothorax, pleural effusion or consolidation identified on this supine view. Chronic left clavicle fracture is stable. No acute osseous abnormality identified. Negative visible bowel gas. IMPRESSION: 1. Nonspecific increased pulmonary interstitial opacity. Consider mild or developing pulmonary edema, atelectasis, viral/atypical respiratory infection. 2. Chronic left clavicle fracture. No acute traumatic injury identified. Electronically Signed   By: Genevie Ann M.D.   On: 06/01/2022 06:23  Labs: BNP (last 3 results) Recent Labs    06/01/22 0904 06/14/22 1053  BNP 555.3* 967.5*   Basic Metabolic Panel: Recent Labs  Lab 06/14/22 1551 06/14/22 2109 06/15/22 0115 06/15/22 0616 06/15/22 0823 06/15/22 1218 06/15/22 1552 06/16/22 0522 06/17/22 0333  NA 131*   < > 134*   < > 138 138 133* 134* 139  K 4.4   < > 3.3*   < > 3.0* 4.1 4.1 4.2 4.0  CL 101   < > 97*   < > 97* 100 94* 98 103  CO2 <7*   < > 25   < > 32 '29 24 29 30  '$ GLUCOSE 579*   < > 147*   < > 111* 160* 259* 359* 190*  BUN 39*   < > 32*   < > '23 20 19 15 14  '$ CREATININE 1.50*   < > 0.92   < > 0.65 0.70 0.66 0.63 0.69  CALCIUM 8.8*   < > 8.0*   < > 7.7* 8.0* 7.5* 7.9* 8.6*  MG 2.2  --  1.9  --   --   --   --  2.4 2.3  PHOS  --   --  2.2*  --   --   --   --  2.5  --    < > = values in this interval not displayed.   Liver Function Tests: No results for input(s): "AST", "ALT", "ALKPHOS", "BILITOT", "PROT", "ALBUMIN" in the last 168 hours. No results for input(s): "LIPASE", "AMYLASE" in the last 168 hours. No results for input(s): "AMMONIA" in the last 168 hours. CBC: Recent Labs  Lab 06/14/22 1053 06/15/22 0338 06/17/22 0333  WBC 20.6* 9.0 7.9  NEUTROABS  --  7.6  --   HGB 11.1* 10.4* 10.5*  HCT 36.0 28.9* 30.7*  MCV 109.1* 93.8 99.4  PLT 489* 321 283   Cardiac Enzymes: No results for input(s): "CKTOTAL", "CKMB", "CKMBINDEX", "TROPONINI" in the last 168 hours. BNP: Invalid  input(s): "POCBNP" CBG: Recent Labs  Lab 06/16/22 1119 06/16/22 1553 06/16/22 2056 06/17/22 0906 06/17/22 1130  GLUCAP 255* 158* 125* 285* 290*   D-Dimer No results for input(s): "DDIMER" in the last 72 hours. Hgb A1c No results for input(s): "HGBA1C" in the last 72 hours. Lipid Profile No results for input(s): "CHOL", "HDL", "LDLCALC", "TRIG", "CHOLHDL", "LDLDIRECT" in the last 72 hours. Thyroid function studies No results for input(s): "TSH", "T4TOTAL", "T3FREE", "THYROIDAB" in the last 72 hours.  Invalid input(s): "FREET3" Anemia work up No results for input(s): "VITAMINB12", "FOLATE", "FERRITIN", "TIBC", "IRON", "RETICCTPCT" in the last 72 hours. Urinalysis    Component Value Date/Time   COLORURINE YELLOW (A) 06/14/2022 1459   APPEARANCEUR HAZY (A) 06/14/2022 1459   LABSPEC 1.017 06/14/2022 1459   PHURINE 5.0 06/14/2022 1459   GLUCOSEU >=500 (A) 06/14/2022 1459   HGBUR LARGE (A) 06/14/2022 1459   BILIRUBINUR NEGATIVE 06/14/2022 1459   KETONESUR 80 (A) 06/14/2022 1459   PROTEINUR 100 (A) 06/14/2022 1459   NITRITE NEGATIVE 06/14/2022 1459   LEUKOCYTESUR NEGATIVE 06/14/2022 1459   Sepsis Labs Recent Labs  Lab 06/14/22 1053 06/15/22 0338 06/17/22 0333  WBC 20.6* 9.0 7.9   Microbiology Recent Results (from the past 240 hour(s))  Blood Culture (routine x 2)     Status: None (Preliminary result)   Collection Time: 06/14/22 11:20 AM   Specimen: BLOOD  Result Value Ref Range Status   Specimen Description BLOOD IJ LINE  Final   Special Requests  Final    BOTTLES DRAWN AEROBIC AND ANAEROBIC Blood Culture results may not be optimal due to an inadequate volume of blood received in culture bottles   Culture   Final    NO GROWTH 3 DAYS Performed at [facility], [address] [place], [place] [address]    Report Status PENDING  Incomplete  Respiratory (~20 pathogens) panel by PCR     Status: None   Collection Time: 06/14/22 12:17 PM   Specimen:  Nasopharyngeal Swab; Respiratory  Result Value Ref Range Status   Adenovirus NOT DETECTED NOT DETECTED Final   Coronavirus 229E NOT DETECTED NOT DETECTED Final    Comment: (NOTE) The Coronavirus on the Respiratory Panel, DOES NOT test for the novel  Coronavirus (2019 nCoV)    Coronavirus HKU1 NOT DETECTED NOT DETECTED Final   Coronavirus NL63 NOT DETECTED NOT DETECTED Final   Coronavirus OC43 NOT DETECTED NOT DETECTED Final   Metapneumovirus NOT DETECTED NOT DETECTED Final   Rhinovirus / Enterovirus NOT DETECTED NOT DETECTED Final   Influenza A NOT DETECTED NOT DETECTED Final   Influenza B NOT DETECTED NOT DETECTED Final   Parainfluenza Virus 1 NOT DETECTED NOT DETECTED Final   Parainfluenza Virus 2 NOT DETECTED NOT DETECTED Final   Parainfluenza Virus 3 NOT DETECTED NOT DETECTED Final   Parainfluenza Virus 4 NOT DETECTED NOT DETECTED Final   Respiratory Syncytial Virus NOT DETECTED NOT DETECTED Final   Bordetella pertussis NOT DETECTED NOT DETECTED Final   Bordetella Parapertussis NOT DETECTED NOT DETECTED Final   Chlamydophila pneumoniae NOT DETECTED NOT DETECTED Final   Mycoplasma pneumoniae NOT DETECTED NOT DETECTED Final    Comment: Performed at [facility], [place]. [address] [place], [place] [address]  Blood Culture (routine x 2)     Status: None (Preliminary result)   Collection Time: 06/14/22  1:47 PM   Specimen: BLOOD  Result Value Ref Range Status   Specimen Description BLOOD WRIST  Final   Special Requests   Final    BOTTLES DRAWN AEROBIC AND ANAEROBIC Blood Culture adequate volume   Culture   Final    NO GROWTH 3 DAYS Performed at [facility], [address] [place], [place] [address]    Report Status PENDING  Incomplete  C Difficile Quick Screen w PCR reflex     Status: None   Collection Time: 06/16/22  1:03 PM   Specimen: STOOL  Result Value Ref Range Status   C Diff antigen NEGATIVE NEGATIVE Final   C Diff toxin NEGATIVE NEGATIVE Final   C  Diff interpretation No C. difficile detected.  Final    Comment: Performed at [facility], [address] [place], [place] [address]     Total time spend on discharging this patient, including the last patient exam, discussing the hospital stay, instructions for ongoing care as it relates to all pertinent caregivers, as well as preparing the medical discharge records, prescriptions, and/or referrals as applicable, is 35 minutes.    Enzo Bi, MD  [facility] 06/17/2022, 3:13 PM

## 2022-06-19 LAB — CULTURE, BLOOD (ROUTINE X 2)
Culture: NO GROWTH
Culture: NO GROWTH
Special Requests: ADEQUATE

## 2022-06-27 LAB — BLOOD GAS, VENOUS
Acid-base deficit: 23.3 mmol/L — ABNORMAL HIGH (ref 0.0–2.0)
Bicarbonate: 4.7 mmol/L — ABNORMAL LOW (ref 20.0–28.0)
O2 Saturation: 94.9 %
O2 Saturation: 89.1 %
Patient temperature: 37
Patient temperature: 37
pCO2, Ven: 21 mmHg — ABNORMAL LOW (ref 44–60)
pH, Ven: 7.08 — CL (ref 7.25–7.43)
pO2, Ven: 88 mmHg — ABNORMAL HIGH (ref 32–45)
pO2, Ven: 52 mmHg — ABNORMAL HIGH (ref 32–45)

## 2022-06-27 LAB — BLOOD GAS, ARTERIAL
O2 Saturation: 98.7 %
Patient temperature: 37
pO2, Arterial: 163 mmHg — ABNORMAL HIGH (ref 83–108)

## 2022-11-20 ENCOUNTER — Other Ambulatory Visit: Payer: Self-pay | Admitting: Cardiovascular Disease

## 2022-12-22 ENCOUNTER — Other Ambulatory Visit: Payer: Self-pay | Admitting: Cardiovascular Disease

## 2022-12-22 NOTE — Telephone Encounter (Signed)
Please contact pt for future appointment. °Pt due for 12 month f/u. °

## 2022-12-23 NOTE — Telephone Encounter (Signed)
Pt scheduled on 6/28.

## 2023-01-13 NOTE — Progress Notes (Deleted)
Date:  01/13/2023   ID:  Michaela Johnson, DOB [birth year], MRN [id number]  Patient Location:  [address] [name] [address]   Provider location:   [facility], [place] office  PCP:  Sandrea Hughs, NP  Cardiologist:  [facility]  No chief complaint on file.   History of Present Illness:    Michaela Johnson is a 62 y.o. female past medical history of paroxysmal atrial fibrillation, August 2019 insulin-dependent diabetes,  prior history of DKA,  sunstance abuse including cocaine, alcohol abuse abuse neuropathy,   [facility] 01/2018 with DKA, nausea vomiting potassium 7.1 sodium level 123 Syncope in the setting of vasovagal episodes Who presents to follow-up with her atrial fibrillation, syncope/vasovagal episodes  Last seen by myself in clinic October 2022 Seen by one of our providers June 2023  Hospitalized November 2023, drug abuse, severe acidemia, DKA Left AMA  admitted to ICU with acute toxic metabolic encephalopathy; hypotension requiring Levophed gtt and DKA requiring insulin gtt.  Developed iatrogenic right apical pneumothorax in the setting of attempted right subclavian central line  Cr 1.91 on presentation.  AKI resolved after IVF  Potassium 6.2 on presentation   A1c 11.7 Recurrent hospitalizations for DKA  in the hospital end of September into beginning of October 2022 for nausea and vomiting.  serum glucose 534, anion gap 28, beta hydroxybutyrate >8, ketonuria, and pH 7.18 on vBG consistent with DKA.  -On discussion today she is unsure how her sugars got so high  Over the past several months, reports she has Lost weight 150 to 138 pounds Most notably in the past 3 weeks  Reports having orthostasis symptoms the past 2 weeks at least Has to stand in place close her eyes breathe until dizziness goes away Typically happens going from sitting to standing position  Not sure what medications she is taking Told to stop amlodipine  benazepril on d/c paperwork from the hospital Thinks she is taking it? Also on diltiazem 240/lisinopril 40 Did not bring a list with her today  Denies chest pain on exertion Reports previously with chest discomfort in the setting of vomiting prior to admission  A1c 7.9  EKG personally reviewed by myself on todays visit Normal sinus rhythm rate 85 bpm PVCs left bundle branch block No change from prior EKG  Other past medical history reviewed 01/2019 seen in the ER  diarrhea, bleeding, weak Syncope sitting on the commode [place] bathroom, wrist injury On eliquis  7/12 to 7./14 emesis, hyperkalemia,DKA, atrial fibrillation 7/17 to 7/28  Weakness emesis, hyperkalemia,DKA 8/4 to 8/5, PAF, emisis, hyperkalemia,DKA, treated with amiodaronetreated with amiodarone, eliquis   In follow-up today she reports having diarrhea one week ago, She thinks it was from drinking a Glucerna Felt dizzy while having the diarrhea in the bathroom, developed syncope Did not go to the hospital Hit the side of her face on the left    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 07/30/2020   Constipation    Depression    Diabetes mellitus without complication (HCC)    Dysrhythmia     PAF   GERD (gastroesophageal reflux disease)    Hypertension    Neuropathy    Nicotine dependence 07/31/2020   PAF (paroxysmal atrial fibrillation) (HCC)  Panic attacks    Pneumonia    Tobacco use    Vitamin D deficiency    Past Surgical History:  Procedure Laterality Date   ANKLE FUSION Right 07/30/2020   Procedure: ANKLE FUSION;  Surgeon: Myrene Galas, MD;  Location: [facility] OR;  Service: Orthopedics;  Laterality: Right;   APPLICATION OF WOUND VAAPPLICATION OF WOUND VAC (Right Ankle)C (Right Ankle)  07/31/2020   APPLICATION OF WOUND VAC  Right 07/30/2020   Procedure: APPLICATION OF WOUND VAC;  Surgeon: Myrene Galas, MD;  Location: [facility] OR;  Service: Orthopedics;  Laterality: Right;   HARDWARE REMOVAL Right 07/30/2020   Procedure: HARDWARE REMOVAL RIGHT ANKLE;  Surgeon: Myrene Galas, MD;  Location: [facility] OR;  Service: Orthopedics;  Laterality: Right;   HARDWARE REMOVAL RIGHT ANKLE (Right Ankle  07/31/2020   INTRAMEDULLARY (IM) NAIL INTERTROCHANTERIC Right 01/04/2022   Procedure: INTRAMEDULLARY (IM) NAIL INTERTROCHANTRIC;  Surgeon: Christena Flake, MD;  Location: [facility] ORS;  Service: Orthopedics;  Laterality: Right;   ORIF ANKLE FRACTURE Right 07/04/2020   Procedure: OPEN REDUCTION INTERNAL FIXATION (ORIF) ANKLE FRACTURE;  Surgeon: Lyndle Herrlich, MD;  Location: [facility] ORS;  Service: Orthopedics;  Laterality: Right;     Allergies:   Glipizide, Metformin, Codeine, and Levaquin [levofloxacin in d5w]   Social History   Tobacco Use   Smoking status: Every Day    Packs/day: 1.00    Years: 41.00    Additional pack years: 0.00    Total pack years: 41.00    Types: Cigarettes, E-cigarettes, Cigars   Smokeless tobacco: Never  Vaping Use   Vaping Use: Never used  Substance Use Topics   Alcohol use: Yes    Alcohol/week: 6.0 standard drinks of alcohol    Types: 6 Standard drinks or equivalent per week    Comment: last use 04/30/22 liquor   Drug use: Yes    Frequency: 7.0 times per week    Types: Marijuana    Comment: daily      Outpatient Encounter Medications as of 01/15/2023  Medication Sig Note   acetaminophen (TYLENOL) 500 MG tablet Take 1-2 tablets (500-1,000 mg total) by mouth every 6 (six) hours as needed for moderate pain.    albuterol (PROVENTIL HFA;VENTOLIN HFA) 108 (90 Base) MCG/ACT inhaler Inhale 2 puffs into the lungs every 6 (six) hours as needed for wheezing or shortness of breath.    Cholecalciferol (VITAMIN D) 125 MCG (5000 UT) CAPS Take 1 capsule by mouth daily.    diltiazem (CARDIZEM CD) 180 MG 24 hr capsule Take  1 capsule (180 mg total) by mouth daily. PLEASE KEEP SCHEDULED OFFICE VISIT FOR FURTHER REFILLS. THANK YOU!    diphenhydrAMINE (BENADRYL) 12.5 MG/5ML liquid Take 25 mg by mouth 4 (four) times daily as needed for itching.    docusate sodium (COLACE) 100 MG capsule Take 1 capsule (100 mg total) by mouth 2 (two) times daily.    gabapentin (NEURONTIN) 300 MG capsule Take 900 mg by mouth at bedtime. 06/02/2022: Patient has refilled a 30 day supply every month since 06/2021, last fill date 05/13/22   JARDIANCE 10 MG TABS tablet Take 10 mg by mouth daily. 06/02/2022: Patient has refilled medication every 90 days since 08/27/21, with last refill 05/26/22 with one refill remaining   LANTUS SOLOSTAR 100 UNIT/ML Solostar Pen Inject 37 Units into the skin daily. This is your home dose of long-acting insulin. 06/02/2022: Last fill date 03/31/22 #65 day supply, 1 refill remains   Multiple Vitamin (MULTIVITAMIN WITH MINERALS) TABS tablet Take  1 tablet by mouth daily.    omeprazole (PRILOSEC) 20 MG capsule Take 20 mg by mouth daily. 06/02/2022: Last fill date 05/13/22 #90 day supply, 2 refills remain    rivaroxaban (XARELTO) 20 MG TABS tablet Take 1 tablet (20 mg total) by mouth daily with supper. 06/02/2022: Last fill date 05/12/22 #90 day supply, 1 refill remains   No facility-administered encounter medications on file as of 01/15/2023.     Family Hx: The patient's family history includes CAD in her father; Diabetes in her mother; Heart Problems in her father; Heart failure in her father.  ROS:   Please see the history of present illness.    Review of Systems  Constitutional: Negative.   HENT: Negative.    Respiratory: Negative.    Cardiovascular: Negative.   Gastrointestinal: Negative.   Musculoskeletal: Negative.   Neurological:  Positive for dizziness.  Psychiatric/Behavioral: Negative.    All other systems reviewed and are negative.    Labs/Other Tests and Data Reviewed:    Recent  Labs: 06/01/2022: ALT 18; TSH 2.140 06/14/2022: B Natriuretic Peptide 298.4 06/17/2022: BUN 14; Creatinine, Ser 0.69; Hemoglobin 10.5; Magnesium 2.3; Platelets 283; Potassium 4.0; Sodium 139   Recent Lipid Panel Lab Results  Component Value Date/Time   CHOL 157 06/01/2022 09:04 AM   TRIG 106 01/30/2018 05:31 AM   HDL 73 01/30/2018 05:31 AM   CHOLHDL 1.9 01/30/2018 05:31 AM   LDLCALC 42 01/30/2018 05:31 AM    Wt Readings from Last 3 Encounters:  06/17/22 121 lb 0.5 oz (54.9 kg)  06/03/22 110 lb (49.9 kg)  01/16/22 117 lb 4 oz (53.2 kg)     Exam:    Vital Signs:  There were no vitals taken for this visit.   Constitutional:  oriented to person, place, and time. No distress.  HENT:  Head: Grossly normal Eyes:  no discharge. No scleral icterus.  Neck: No JVD, no carotid bruits  Cardiovascular: Regular rate and rhythm, no murmurs appreciated Pulmonary/Chest: Clear to auscultation bilaterally, no wheezes or rails Abdominal: Soft.  no distension.  no tenderness.  Musculoskeletal: Normal range of motion Neurological:  normal muscle tone. Coordination normal. No atrophy Skin: Skin warm and dry Psychiatric: normal affect, pleasant   ASSESSMENT & PLAN:    Problem List Items Addressed This Visit   None PAF (paroxysmal atrial fibrillation) (HCC) - Plan: EKG 12-Lead continue on anticoagulation, Xarelto Elevated CHDS VASC : female, diabetic, vascular disease, hypertension She will confirm her medications with [place], will continue diltiazem 240 mg daily for now, if blood pressure running low may need to decrease the dose of either the diltiazem or lisinopril or both She reports recent weight loss  Orthostasis Blood pressure running low, some medication confusion, she will go home look at her medications and call us with the list If taking amlodipine benazepril, this will be stopped and will continue diltiazem and lisinopril at current dose If not on amlodipine benazepril, will  decrease doses of her diltiazem/lisinopril Drop in pressure likely from recent weight loss over 10 pounds per her account   Diabetic ketoacidosis without coma associated with other specified diabetes mellitus (HCC) - Numerous hospital admissions for high sugars, DKA Recent admission for vomiting, glucose levels 500 Stressed importance of taking her diabetes medications   Substance abuse (HCC) We will recommend cessation from any alcohol, cocaine  Insulin dependent diabetes mellitus (HCC) Managed by primary care Recent hospital admission for sugars of 500, nausea vomiting  Syncope Prior episodes, none recently  1  episode in the setting of diarrhea, vasovagal response Recommend strict sugar control, hydration with low sugar beverage   Total encounter time more than 25 minutes  Greater than 50% was spent in counseling and coordination of care with the patient    Signed, Julien Nordmann, MD  [facility] [address], [place], [place] [address]

## 2023-01-15 ENCOUNTER — Ambulatory Visit: Payer: Medicaid Other | Admitting: Cardiovascular Disease

## 2023-01-15 DIAGNOSIS — Z720 Tobacco use: Secondary | ICD-10-CM

## 2023-01-15 DIAGNOSIS — R55 Syncope and collapse: Secondary | ICD-10-CM

## 2023-01-15 DIAGNOSIS — E1110 Type 2 diabetes mellitus with ketoacidosis without coma: Secondary | ICD-10-CM

## 2023-01-15 DIAGNOSIS — I10 Essential (primary) hypertension: Secondary | ICD-10-CM

## 2023-01-15 DIAGNOSIS — I480 Paroxysmal atrial fibrillation: Secondary | ICD-10-CM

## 2023-01-16 ENCOUNTER — Ambulatory Visit: Payer: Self-pay | Admitting: Cardiology

## 2023-01-26 NOTE — Progress Notes (Deleted)
Date:  01/26/2023   ID:  Michaela Johnson, DOB 12/25/1960, MRN [id number]  Patient Location:  [address] [place] [address]   Provider location:   [facility], [place] office  PCP:  Sandrea Hughs, NP  Cardiologist:  [facility]  No chief complaint on file.   History of Present Illness:    Solmarie Hilgendorf is a 62 y.o. female past medical history of paroxysmal atrial fibrillation, August 2019 insulin-dependent diabetes,  prior history of DKA,  sunstance abuse including cocaine, alcohol abuse abuse neuropathy,   [facility] 01/2018 with DKA, nausea vomiting potassium 7.1 sodium level 123 Syncope in the setting of vasovagal episodes Who presents to follow-up with her atrial fibrillation, syncope/vasovagal episodes  Last seen by myself in clinic October 2022 Seen by one of our providers June 2023  Hospitalized November 2023, drug abuse, severe acidemia, DKA Left AMA  admitted to ICU with acute toxic metabolic encephalopathy; hypotension requiring Levophed gtt and DKA requiring insulin gtt.  Developed iatrogenic right apical pneumothorax in the setting of attempted right subclavian central line  Cr 1.91 on presentation.  AKI resolved after IVF  Potassium 6.2 on presentation   A1c 11.7 Recurrent hospitalizations for DKA  in the hospital end of September into beginning of October 2022 for nausea and vomiting.  serum glucose 534, anion gap 28, beta hydroxybutyrate >8, ketonuria, and pH 7.18 on vBG consistent with DKA.  -On discussion today she is unsure how her sugars got so high  Over the past several months, reports she has Lost weight 150 to 138 pounds Most notably in the past 3 weeks  Reports having orthostasis symptoms the past 2 weeks at least Has to stand in place close her eyes breathe until dizziness goes away Typically happens going from sitting to standing position  Not sure what medications she is taking Told to stop amlodipine  benazepril on d/c paperwork from the hospital Thinks she is taking it? Also on diltiazem 240/lisinopril 40 Did not bring a list with her today  Denies chest pain on exertion Reports previously with chest discomfort in the setting of vomiting prior to admission  A1c 7.9  EKG personally reviewed by myself on todays visit Normal sinus rhythm rate 85 bpm PVCs left bundle branch block No change from prior EKG  Other past medical history reviewed 01/2019 seen in the ER  diarrhea, bleeding, weak Syncope sitting on the commode [place] bathroom, wrist injury On eliquis  7/12 to 7./14 emesis, hyperkalemia,DKA, atrial fibrillation 7/17 to 7/28  Weakness emesis, hyperkalemia,DKA 8/4 to 8/5, PAF, emisis, hyperkalemia,DKA, treated with amiodaronetreated with amiodarone, eliquis   In follow-up today she reports having diarrhea one week ago, She thinks it was from drinking a Glucerna Felt dizzy while having the diarrhea in the bathroom, developed syncope Did not go to the hospital Hit the side of her face on the left    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 07/30/2020   Constipation    Depression    Diabetes mellitus without complication (HCC)    Dysrhythmia     PAF   GERD (gastroesophageal reflux disease)    Hypertension    Neuropathy    Nicotine dependence 07/31/2020   PAF (paroxysmal atrial fibrillation) (HCC)  Panic attacks    Pneumonia    Tobacco use    Vitamin D deficiency    Past Surgical History:  Procedure Laterality Date   ANKLE FUSION Right 07/30/2020   Procedure: ANKLE FUSION;  Surgeon: Myrene Galas, MD;  Location: [facility] OR;  Service: Orthopedics;  Laterality: Right;   APPLICATION OF WOUND VAAPPLICATION OF WOUND VAC (Right Ankle)C (Right Ankle)  07/31/2020   APPLICATION OF WOUND VAC  Right 07/30/2020   Procedure: APPLICATION OF WOUND VAC;  Surgeon: Myrene Galas, MD;  Location: [facility] OR;  Service: Orthopedics;  Laterality: Right;   HARDWARE REMOVAL Right 07/30/2020   Procedure: HARDWARE REMOVAL RIGHT ANKLE;  Surgeon: Myrene Galas, MD;  Location: [facility] OR;  Service: Orthopedics;  Laterality: Right;   HARDWARE REMOVAL RIGHT ANKLE (Right Ankle  07/31/2020   INTRAMEDULLARY (IM) NAIL INTERTROCHANTERIC Right 01/04/2022   Procedure: INTRAMEDULLARY (IM) NAIL INTERTROCHANTRIC;  Surgeon: Christena Flake, MD;  Location: [facility] ORS;  Service: Orthopedics;  Laterality: Right;   ORIF ANKLE FRACTURE Right 07/04/2020   Procedure: OPEN REDUCTION INTERNAL FIXATION (ORIF) ANKLE FRACTURE;  Surgeon: Lyndle Herrlich, MD;  Location: [facility] ORS;  Service: Orthopedics;  Laterality: Right;     Allergies:   Glipizide, Metformin, Codeine, and Levaquin [levofloxacin in d5w]   Social History   Tobacco Use   Smoking status: Every Day    Packs/day: 1.00    Years: 41.00    Additional pack years: 0.00    Total pack years: 41.00    Types: Cigarettes, E-cigarettes, Cigars   Smokeless tobacco: Never  Vaping Use   Vaping Use: Never used  Substance Use Topics   Alcohol use: Yes    Alcohol/week: 6.0 standard drinks of alcohol    Types: 6 Standard drinks or equivalent per week    Comment: last use 04/30/22 liquor   Drug use: Yes    Frequency: 7.0 times per week    Types: Marijuana    Comment: daily      Outpatient Encounter Medications as of 01/27/2023  Medication Sig Note   acetaminophen (TYLENOL) 500 MG tablet Take 1-2 tablets (500-1,000 mg total) by mouth every 6 (six) hours as needed for moderate pain.    albuterol (PROVENTIL HFA;VENTOLIN HFA) 108 (90 Base) MCG/ACT inhaler Inhale 2 puffs into the lungs every 6 (six) hours as needed for wheezing or shortness of breath.    Cholecalciferol (VITAMIN D) 125 MCG (5000 UT) CAPS Take 1 capsule by mouth daily.    diltiazem (CARDIZEM CD) 180 MG 24 hr capsule Take 1  capsule (180 mg total) by mouth daily. PLEASE KEEP SCHEDULED OFFICE VISIT FOR FURTHER REFILLS. THANK YOU!    diphenhydrAMINE (BENADRYL) 12.5 MG/5ML liquid Take 25 mg by mouth 4 (four) times daily as needed for itching.    docusate sodium (COLACE) 100 MG capsule Take 1 capsule (100 mg total) by mouth 2 (two) times daily.    gabapentin (NEURONTIN) 300 MG capsule Take 900 mg by mouth at bedtime. 06/02/2022: Patient has refilled a 30 day supply every month since 06/2021, last fill date 05/13/22   JARDIANCE 10 MG TABS tablet Take 10 mg by mouth daily. 06/02/2022: Patient has refilled medication every 90 days since 08/27/21, with last refill 05/26/22 with one refill remaining   LANTUS SOLOSTAR 100 UNIT/ML Solostar Pen Inject 37 Units into the skin daily. This is your home dose of long-acting insulin. 06/02/2022: Last fill date 03/31/22 #65 day supply, 1 refill remains   Multiple Vitamin (MULTIVITAMIN WITH MINERALS) TABS tablet Take  1 tablet by mouth daily.    omeprazole (PRILOSEC) 20 MG capsule Take 20 mg by mouth daily. 06/02/2022: Last fill date 05/13/22 #90 day supply, 2 refills remain    rivaroxaban (XARELTO) 20 MG TABS tablet Take 1 tablet (20 mg total) by mouth daily with supper. 06/02/2022: Last fill date 05/12/22 #90 day supply, 1 refill remains   No facility-administered encounter medications on file as of 01/27/2023.     Family Hx: The patient's family history includes CAD in her father; Diabetes in her mother; Heart Problems in her father; Heart failure in her father.  ROS:   Please see the history of present illness.    Review of Systems  Constitutional: Negative.   HENT: Negative.    Respiratory: Negative.    Cardiovascular: Negative.   Gastrointestinal: Negative.   Musculoskeletal: Negative.   Neurological:  Positive for dizziness.  Psychiatric/Behavioral: Negative.    All other systems reviewed and are negative.    Labs/Other Tests and Data Reviewed:    Recent  Labs: 06/01/2022: ALT 18; TSH 2.140 06/14/2022: B Natriuretic Peptide 298.4 06/17/2022: BUN 14; Creatinine, Ser 0.69; Hemoglobin 10.5; Magnesium 2.3; Platelets 283; Potassium 4.0; Sodium 139   Recent Lipid Panel Lab Results  Component Value Date/Time   CHOL 157 06/01/2022 09:04 AM   TRIG 106 01/30/2018 05:31 AM   HDL 73 01/30/2018 05:31 AM   CHOLHDL 1.9 01/30/2018 05:31 AM   LDLCALC 42 01/30/2018 05:31 AM    Wt Readings from Last 3 Encounters:  06/17/22 121 lb 0.5 oz (54.9 kg)  06/03/22 110 lb (49.9 kg)  01/16/22 117 lb 4 oz (53.2 kg)     Exam:    Vital Signs:  There were no vitals taken for this visit.   Constitutional:  oriented to person, place, and time. No distress.  HENT:  Head: Grossly normal Eyes:  no discharge. No scleral icterus.  Neck: No JVD, no carotid bruits  Cardiovascular: Regular rate and rhythm, no murmurs appreciated Pulmonary/Chest: Clear to auscultation bilaterally, no wheezes or rails Abdominal: Soft.  no distension.  no tenderness.  Musculoskeletal: Normal range of motion Neurological:  normal muscle tone. Coordination normal. No atrophy Skin: Skin warm and dry Psychiatric: normal affect, pleasant   ASSESSMENT & PLAN:    Problem List Items Addressed This Visit   None PAF (paroxysmal atrial fibrillation) (HCC) - Plan: EKG 12-Lead continue on anticoagulation, Xarelto Elevated CHDS VASC : female, diabetic, vascular disease, hypertension She will confirm her medications with [place], will continue diltiazem 240 mg daily for now, if blood pressure running low may need to decrease the dose of either the diltiazem or lisinopril or both She reports recent weight loss  Orthostasis Blood pressure running low, some medication confusion, she will go home look at her medications and call us with the list If taking amlodipine benazepril, this will be stopped and will continue diltiazem and lisinopril at current dose If not on amlodipine benazepril, will  decrease doses of her diltiazem/lisinopril Drop in pressure likely from recent weight loss over 10 pounds per her account   Diabetic ketoacidosis without coma associated with other specified diabetes mellitus (HCC) - Numerous hospital admissions for high sugars, DKA Recent admission for vomiting, glucose levels 500 Stressed importance of taking her diabetes medications   Substance abuse (HCC) We will recommend cessation from any alcohol, cocaine  Insulin dependent diabetes mellitus (HCC) Managed by primary care Recent hospital admission for sugars of 500, nausea vomiting  Syncope Prior episodes, none recently  1  episode in the setting of diarrhea, vasovagal response Recommend strict sugar control, hydration with low sugar beverage   Total encounter time more than 25 minutes  Greater than 50% was spent in counseling and coordination of care with the patient    Signed, Julien Nordmann, MD  [facility] [address], [place], [place] [address]

## 2023-01-27 ENCOUNTER — Other Ambulatory Visit: Payer: Self-pay | Admitting: Cardiovascular Disease

## 2023-01-27 ENCOUNTER — Encounter: Payer: Self-pay | Admitting: Cardiovascular Disease

## 2023-01-27 ENCOUNTER — Ambulatory Visit: Payer: Medicaid Other | Attending: Cardiology | Admitting: Cardiovascular Disease

## 2023-01-28 NOTE — Telephone Encounter (Signed)
Pt scheduled on 7/12

## 2023-01-28 NOTE — Telephone Encounter (Signed)
Please reschedule office visit for further refills. Patient did not show or cancelled last three office visits. Thank you!

## 2023-01-28 NOTE — Telephone Encounter (Signed)
Will you please refill this medication if appropriate? Patient has office visit scheduled this Friday. She has no showed/cancelled her last three appointments. Thank you!

## 2023-01-30 ENCOUNTER — Encounter: Payer: Self-pay | Admitting: Cardiovascular Disease

## 2023-01-30 ENCOUNTER — Other Ambulatory Visit
Admission: RE | Admit: 2023-01-30 | Discharge: 2023-01-30 | Disposition: A | Discharge status: Home / Self Care | Payer: Medicaid Other | Source: Ambulatory Visit | Attending: Cardiovascular Disease | Admitting: Cardiovascular Disease

## 2023-01-30 ENCOUNTER — Ambulatory Visit: Payer: Medicaid Other | Admitting: Cardiovascular Disease

## 2023-01-30 VITALS — BP 88/50 | HR 83 | Ht 62.0 in | Wt 112.4 lb

## 2023-01-30 DIAGNOSIS — Z720 Tobacco use: Secondary | ICD-10-CM | POA: Diagnosis not present

## 2023-01-30 DIAGNOSIS — I10 Essential (primary) hypertension: Secondary | ICD-10-CM | POA: Diagnosis not present

## 2023-01-30 DIAGNOSIS — Z7984 Long term (current) use of oral hypoglycemic drugs: Secondary | ICD-10-CM

## 2023-01-30 DIAGNOSIS — Z760 Encounter for issue of repeat prescription: Secondary | ICD-10-CM

## 2023-01-30 DIAGNOSIS — Z79899 Other long term (current) drug therapy: Secondary | ICD-10-CM

## 2023-01-30 DIAGNOSIS — F1090 Alcohol use, unspecified, uncomplicated: Secondary | ICD-10-CM

## 2023-01-30 DIAGNOSIS — I480 Paroxysmal atrial fibrillation: Secondary | ICD-10-CM

## 2023-01-30 DIAGNOSIS — E1110 Type 2 diabetes mellitus with ketoacidosis without coma: Secondary | ICD-10-CM

## 2023-01-30 LAB — BASIC METABOLIC PANEL
Anion gap: 8 (ref 5–15)
BUN: 19 mg/dL (ref 8–23)
CO2: 24 mmol/L (ref 22–32)
Calcium: 9.1 mg/dL (ref 8.9–10.3)
Chloride: 100 mmol/L (ref 98–111)
Creatinine, Ser: 0.74 mg/dL (ref 0.44–1.00)
GFR, Estimated: >60 mL/min (ref >60)
Glucose, Bld: 139 mg/dL — ABNORMAL HIGH (ref 70–99)
Potassium: 3.8 mmol/L (ref 3.5–5.1)
Sodium: 132 mmol/L — ABNORMAL LOW (ref 135–145)

## 2023-01-30 LAB — CBC
HCT: 39 % (ref 36.0–46.0)
Hemoglobin: 13.6 g/dL (ref 12.0–15.0)
MCH: 31.6 pg (ref 26.0–34.0)
MCHC: 34.9 g/dL (ref 30.0–36.0)
MCV: 90.7 fL (ref 80.0–100.0)
Platelets: 338 10*3/uL (ref 150–400)
RBC: 4.3 MIL/uL (ref 3.87–5.11)
RDW: 12.1 % (ref 11.5–15.5)
WBC: 9.9 10*3/uL (ref 4.0–10.5)
nRBC: 0 % (ref 0.0–0.2)

## 2023-01-30 MED ORDER — RIVAROXABAN 20 MG PO TABS: 20.0000 mg | ORAL_TABLET | Freq: Every day | ORAL | 1 refills | Status: DC

## 2023-01-30 MED ORDER — METOPROLOL SUCCINATE ER 25 MG PO TB24: 25.0000 mg | ORAL_TABLET | Freq: Every day | ORAL | 3 refills | Status: DC

## 2023-01-30 NOTE — Patient Instructions (Addendum)
Medication Instructions:  Stop the diltiazem Please start metoprolol succinate 25 mg daily   If you need a refill on your cardiac medications before your next appointment, please call your pharmacy.   Lab work: Your provider would like for you to have following labs drawn: CBC, A1c and BMP.   Please go to the [facility] entrance and check in at the front desk.  You do not need an appointment.  They are open from 7am-6 pm.    Testing/Procedures: No new testing needed  Follow-Up: At [facility], you and your health needs are our priority.  As part of our continuing mission to provide you with exceptional heart care, we have created designated Provider Care Teams.  These Care Teams include your primary Cardiologist (physician) and Advanced Practice Providers (APPs -  Physician Assistants and Nurse Practitioners) who all work together to provide you with the care you need, when you need it.  You will need a follow up appointment in 6 months  Providers on your designated Care Team:   Nicolasa Ducking, NP Eula Listen, PA-C Cadence Fransico Michael, [place]  COVID-19 Vaccine Information can be found at: [email or web address] For questions related to vaccine distribution or appointments, please email [email or web address] or call [phone].

## 2023-01-30 NOTE — Progress Notes (Signed)
Date:  01/30/2023   ID:  Michaela Johnson, DOB 05/27/1961, MRN [id number]  Patient Location:  [address] [name] [address]   Provider location:   [facility],  office  PCP:  Sandrea Hughs, NP  Cardiologist:  Fonnie Mu  Chief Complaint  Patient presents with   12 month follow up     "Doing well." Patient request refill on all cardiac medications.  Medications reviewed by the patient verbally.     History of Present Illness:    Michaela Johnson is a 62 y.o. female past medical history of paroxysmal atrial fibrillation, August 2019 insulin-dependent diabetes,  Recurrent hospitalizations for DKA sunstance abuse including cocaine, alcohol abuse abuse neuropathy,   [facility] 01/2018 with DKA, nausea vomiting potassium 7.1 sodium level 123 Syncope in the setting of vasovagal episodes EF 55 to 60% in 6/23 Who presents to follow-up with her atrial fibrillation, syncope/vasovagal episodes  Last seen by myself in clinic October 2022 Seen by one of our providers June 2023  Hospitalized November 2023, drug abuse, severe acidemia, DKA Left AMA admitted to ICU with acute toxic metabolic encephalopathy; hypotension requiring Levophed gtt and DKA requiring insulin gtt.  Developed iatrogenic right apical pneumothorax in the setting of attempted right subclavian central line  Cr 1.91 on presentation.  AKI resolved after IVF  Potassium 6.2 on presentation   A1c 11.7 in nov 2023 No recent lab work She reports that she has not had follow-up with Phineas Real, declining lab work  Denies palpitations Did not bring a list of medications with her today Has been out of medications she reports past several days including her diltiazem Blood pressure low today 90/50, confirmed by myself  Denies any orthostasis symptoms  Weight down from 138 pounds on last visit with me down to 112 today Eating TV dinners, separated from her husband so eating  less  Denies chest pain concerning for angina  Lots of chest cramping in the exam room today, trouble getting comfortable  EKG personally reviewed by myself on todays visit Normal sinus rhythm rate 83 bpm intraventricular conduction delay, PVCs  Other past medical history reviewed in the hospital end of September into beginning of October 2022 for nausea and vomiting.  serum glucose 534, anion gap 28, beta hydroxybutyrate >8, ketonuria, and pH 7.18 on vBG consistent with DKA.  -On discussion today she is unsure how her sugars got so high  Other past medical history reviewed 01/2019 seen in the ER  diarrhea, bleeding, weak Syncope sitting on the commode [place] bathroom, wrist injury On eliquis  7/12 to 7./14 emesis, hyperkalemia,DKA, atrial fibrillation 7/17 to 7/28  Weakness emesis, hyperkalemia,DKA 8/4 to 8/5, PAF, emisis, hyperkalemia,DKA, treated with amiodaronetreated with amiodarone, eliquis   In follow-up today she reports having diarrhea one week ago, She thinks it was from drinking a Glucerna Felt dizzy while having the diarrhea in the bathroom, developed syncope Did not go to the hospital Hit the side of her face on the left    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 07/30/2020   Constipation    Depression    Diabetes mellitus without complication (HCC)    Dysrhythmia     PAF   GERD (gastroesophageal reflux disease)  Hypertension    Neuropathy    Nicotine dependence 07/31/2020   PAF (paroxysmal atrial fibrillation) (HCC)    Panic attacks    Pneumonia    Tobacco use    Vitamin D deficiency    Past Surgical History:  Procedure Laterality Date   ANKLE FUSION Right 07/30/2020   Procedure: ANKLE FUSION;  Surgeon: Myrene Galas, MD;  Location: [facility] OR;  Service: Orthopedics;   Laterality: Right;   APPLICATION OF WOUND VAAPPLICATION OF WOUND VAC (Right Ankle)C (Right Ankle)  07/31/2020   APPLICATION OF WOUND VAC Right 07/30/2020   Procedure: APPLICATION OF WOUND VAC;  Surgeon: Myrene Galas, MD;  Location: [facility] OR;  Service: Orthopedics;  Laterality: Right;   HARDWARE REMOVAL Right 07/30/2020   Procedure: HARDWARE REMOVAL RIGHT ANKLE;  Surgeon: Myrene Galas, MD;  Location: [facility] OR;  Service: Orthopedics;  Laterality: Right;   HARDWARE REMOVAL RIGHT ANKLE (Right Ankle  07/31/2020   INTRAMEDULLARY (IM) NAIL INTERTROCHANTERIC Right 01/04/2022   Procedure: INTRAMEDULLARY (IM) NAIL INTERTROCHANTRIC;  Surgeon: Christena Flake, MD;  Location: [facility] ORS;  Service: Orthopedics;  Laterality: Right;   ORIF ANKLE FRACTURE Right 07/04/2020   Procedure: OPEN REDUCTION INTERNAL FIXATION (ORIF) ANKLE FRACTURE;  Surgeon: Lyndle Herrlich, MD;  Location: [facility] ORS;  Service: Orthopedics;  Laterality: Right;     Allergies:   Glipizide, Metformin, Codeine, and Levaquin [levofloxacin in d5w]   Social History   Tobacco Use   Smoking status: Every Day    Current packs/day: 1.00    Average packs/day: 1 pack/day for 41.0 years (41.0 ttl pk-yrs)    Types: Cigarettes, E-cigarettes, Cigars   Smokeless tobacco: Never  Vaping Use   Vaping status: Never Used  Substance Use Topics   Alcohol use: Yes    Alcohol/week: 6.0 standard drinks of alcohol    Types: 6 Standard drinks or equivalent per week    Comment: last use yesterday x 1 beer   Drug use: Yes    Frequency: 7.0 times per week    Types: Marijuana    Comment: 3 days every week      Outpatient Encounter Medications as of 01/30/2023  Medication Sig Note   Cholecalciferol (VITAMIN D) 125 MCG (5000 UT) CAPS Take 1 capsule by mouth daily.    diltiazem (CARDIZEM CD) 180 MG 24 hr capsule Take 1 capsule (180 mg total) by mouth daily. PLEASE KEEP SCHEDULED OFFICE VISIT FOR FURTHER REFILLS. THANK YOU!    gabapentin (NEURONTIN) 300 MG capsule  Take 900 mg by mouth at bedtime. 06/02/2022: Patient has refilled a 30 day supply every month since 06/2021, last fill date 05/13/22   JARDIANCE 10 MG TABS tablet Take 10 mg by mouth daily. 06/02/2022: Patient has refilled medication every 90 days since 08/27/21, with last refill 05/26/22 with one refill remaining   LANTUS SOLOSTAR 100 UNIT/ML Solostar Pen Inject 37 Units into the skin daily. This is your home dose of long-acting insulin. 06/02/2022: Last fill date 03/31/22 #65 day supply, 1 refill remains   Multiple Vitamin (MULTIVITAMIN WITH MINERALS) TABS tablet Take 1 tablet by mouth daily.    NOVOLOG FLEXPEN 100 UNIT/ML FlexPen Inject 36 Units into the skin daily.    omeprazole (PRILOSEC) 20 MG capsule Take 20 mg by mouth daily. 06/02/2022: Last fill date 05/13/22 #90 day supply, 2 refills remain    rivaroxaban (XARELTO) 20 MG TABS tablet Take 1 tablet (20 mg total) by mouth daily with supper. 06/02/2022: Last fill date 05/12/22 #90 day supply, 1 refill remains  VICTOZA 18 MG/3ML SOPN Inject 4 mg into the skin daily.    acetaminophen (TYLENOL) 500 MG tablet Take 1-2 tablets (500-1,000 mg total) by mouth every 6 (six) hours as needed for moderate pain. (Patient not taking: Reported on 01/30/2023)    albuterol (PROVENTIL HFA;VENTOLIN HFA) 108 (90 Base) MCG/ACT inhaler Inhale 2 puffs into the lungs every 6 (six) hours as needed for wheezing or shortness of breath. (Patient not taking: Reported on 01/30/2023)    diphenhydrAMINE (BENADRYL) 12.5 MG/5ML liquid Take 25 mg by mouth 4 (four) times daily as needed for itching. (Patient not taking: Reported on 01/30/2023)    docusate sodium (COLACE) 100 MG capsule Take 1 capsule (100 mg total) by mouth 2 (two) times daily. (Patient not taking: Reported on 01/30/2023)    No facility-administered encounter medications on file as of 01/30/2023.     Family Hx: The patient's family history includes CAD in her father; Diabetes in her mother; Heart Problems in her  father; Heart failure in her father.  ROS:   Please see the history of present illness.    Review of Systems  Constitutional: Negative.   HENT: Negative.    Respiratory: Negative.    Cardiovascular: Negative.   Gastrointestinal: Negative.   Musculoskeletal: Negative.   Neurological: Negative.   Psychiatric/Behavioral: Negative.    All other systems reviewed and are negative.    Labs/Other Tests and Data Reviewed:    Recent Labs: 06/01/2022: ALT 18; TSH 2.140 06/14/2022: B Natriuretic Peptide 298.4 06/17/2022: BUN 14; Creatinine, Ser 0.69; Hemoglobin 10.5; Magnesium 2.3; Platelets 283; Potassium 4.0; Sodium 139   Recent Lipid Panel Lab Results  Component Value Date/Time   CHOL 157 06/01/2022 09:04 AM   TRIG 106 01/30/2018 05:31 AM   HDL 73 01/30/2018 05:31 AM   CHOLHDL 1.9 01/30/2018 05:31 AM   LDLCALC 42 01/30/2018 05:31 AM    Wt Readings from Last 3 Encounters:  01/30/23 112 lb 6 oz (51 kg)  06/17/22 121 lb 0.5 oz (54.9 kg)  06/03/22 110 lb (49.9 kg)     Exam:    Vital Signs:  BP (!) 88/50 (BP Location: Left Arm, Patient Position: Sitting, Cuff Size: Normal)   Pulse 84   Ht 5\' 2"  (1.575 m)   Wt 112 lb 6 oz (51 kg)   SpO2 97%   BMI 20.55 kg/m    Constitutional:  oriented to person, place, and time. No distress.  HENT:  Head: Grossly normal Eyes:  no discharge. No scleral icterus.  Neck: No JVD, no carotid bruits  Cardiovascular: Regular rate and rhythm, no murmurs appreciated Pulmonary/Chest: Clear to auscultation bilaterally, no wheezes or rails Abdominal: Soft.  no distension.  no tenderness.  Musculoskeletal: Normal range of motion Neurological:  normal muscle tone. Coordination normal. No atrophy Skin: Skin warm and dry Psychiatric: normal affect, pleasant   ASSESSMENT & PLAN:    Problem List Items Addressed This Visit       Cardiology Problems   PAF (paroxysmal atrial fibrillation) (HCC) - Primary (Chronic)   Relevant Orders   EKG 12-Lead      Other   Diabetic ketoacidosis (HCC)   Relevant Medications   NOVOLOG FLEXPEN 100 UNIT/ML FlexPen   VICTOZA 18 MG/3ML SOPN   Chronic alcohol use   Other Visit Diagnoses     Essential hypertension       Relevant Orders   EKG 12-Lead   Tobacco use         PAF (paroxysmal atrial fibrillation) (HCC) -  Plan: EKG 12-Lead Elevated CHDS VASC : female, diabetic, vascular disease, hypertension Blood pressure very low declining lab work today.  Ideally needs CBC before refilling her Xarelto, this was expressed to her Recommend we stop diltiazem ER 180, changed to metoprolol succinate 25 daily Recommend she hydrate   Orthostasis As on prior clinic visit, blood pressure low likely exacerbated by continued weight loss Denies orthostasis symptoms Medication changes as above, stop diltiazem Recommend hydrate   Diabetic ketoacidosis without coma associated with other specified diabetes mellitus (HCC) - Numerous hospital admissions for high sugars, DKA Prior admission for vomiting, glucose levels 500 We will suggest lab work today, so far has declined Recommend follow-up with Phineas Real   Substance abuse [facility] cessation from any alcohol, cocaine  Insulin dependent diabetes mellitus (HCC) Managed by primary care, has not had close follow-up with Phineas Real Recent hospital admission for sugars of 500, nausea vomiting  Syncope Prior episodes, none recently  1 episode in the setting of diarrhea, vasovagal response Low blood pressure today likely exacerbated by continued weight loss,  medication changes as above   Total encounter time more than 40 minutes  Greater than 50% was spent in counseling and coordination of care with the patient      Signed, Julien Nordmann, MD  [facility] [address], [place], [place] [address]

## 2023-02-02 LAB — HEMOGLOBIN A1C
Hgb A1c MFr Bld: 12 % — ABNORMAL HIGH (ref 4.8–5.6)
Mean Plasma Glucose: 298 mg/dL

## 2023-02-06 ENCOUNTER — Encounter: Payer: Self-pay | Admitting: Emergency Medicine

## 2023-02-17 ENCOUNTER — Encounter: Payer: Self-pay | Admitting: Emergency Medicine

## 2023-05-28 ENCOUNTER — Other Ambulatory Visit: Payer: Self-pay | Admitting: Primary Care

## 2023-05-28 DIAGNOSIS — Z1231 Encounter for screening mammogram for malignant neoplasm of breast: Secondary | ICD-10-CM

## 2023-06-15 ENCOUNTER — Inpatient Hospital Stay
Admission: EM | Admit: 2023-06-15 | Discharge: 2023-06-17 | DRG: 155 | Disposition: A | Discharge status: Home / Self Care | Payer: Medicaid Other | Attending: Internal Medicine | Admitting: Internal Medicine

## 2023-06-15 ENCOUNTER — Emergency Department: Payer: Medicaid Other

## 2023-06-15 ENCOUNTER — Inpatient Hospital Stay: Payer: Medicaid Other | Admitting: Certified Registered Nurse Anesthetist

## 2023-06-15 ENCOUNTER — Encounter: Payer: Self-pay | Admitting: Emergency Medicine

## 2023-06-15 ENCOUNTER — Encounter
Admission: EM | Disposition: A | Discharge status: Home / Self Care | Payer: Self-pay | Source: Home / Self Care | Attending: Internal Medicine

## 2023-06-15 ENCOUNTER — Other Ambulatory Visit: Payer: Self-pay

## 2023-06-15 DIAGNOSIS — B850 Pediculosis due to Pediculus humanus capitis: Secondary | ICD-10-CM | POA: Diagnosis present

## 2023-06-15 DIAGNOSIS — E1140 Type 2 diabetes mellitus with diabetic neuropathy, unspecified: Secondary | ICD-10-CM | POA: Diagnosis present

## 2023-06-15 DIAGNOSIS — Z794 Long term (current) use of insulin: Secondary | ICD-10-CM

## 2023-06-15 DIAGNOSIS — Z881 Allergy status to other antibiotic agents status: Secondary | ICD-10-CM

## 2023-06-15 DIAGNOSIS — E871 Hypo-osmolality and hyponatremia: Secondary | ICD-10-CM | POA: Diagnosis present

## 2023-06-15 DIAGNOSIS — E1169 Type 2 diabetes mellitus with other specified complication: Secondary | ICD-10-CM | POA: Diagnosis present

## 2023-06-15 DIAGNOSIS — I480 Paroxysmal atrial fibrillation: Secondary | ICD-10-CM | POA: Diagnosis present

## 2023-06-15 DIAGNOSIS — L97509 Non-pressure chronic ulcer of other part of unspecified foot with unspecified severity: Secondary | ICD-10-CM

## 2023-06-15 DIAGNOSIS — E1159 Type 2 diabetes mellitus with other circulatory complications: Secondary | ICD-10-CM | POA: Diagnosis not present

## 2023-06-15 DIAGNOSIS — E1165 Type 2 diabetes mellitus with hyperglycemia: Secondary | ICD-10-CM | POA: Diagnosis present

## 2023-06-15 DIAGNOSIS — Z981 Arthrodesis status: Secondary | ICD-10-CM

## 2023-06-15 DIAGNOSIS — Z885 Allergy status to narcotic agent status: Secondary | ICD-10-CM | POA: Diagnosis not present

## 2023-06-15 DIAGNOSIS — G629 Polyneuropathy, unspecified: Secondary | ICD-10-CM

## 2023-06-15 DIAGNOSIS — B9562 Methicillin resistant Staphylococcus aureus infection as the cause of diseases classified elsewhere: Secondary | ICD-10-CM | POA: Diagnosis present

## 2023-06-15 DIAGNOSIS — L97529 Non-pressure chronic ulcer of other part of left foot with unspecified severity: Secondary | ICD-10-CM | POA: Diagnosis not present

## 2023-06-15 DIAGNOSIS — J3489 Other specified disorders of nose and nasal sinuses: Secondary | ICD-10-CM | POA: Diagnosis present

## 2023-06-15 DIAGNOSIS — Z8249 Family history of ischemic heart disease and other diseases of the circulatory system: Secondary | ICD-10-CM | POA: Diagnosis not present

## 2023-06-15 DIAGNOSIS — F17210 Nicotine dependence, cigarettes, uncomplicated: Secondary | ICD-10-CM | POA: Diagnosis present

## 2023-06-15 DIAGNOSIS — Z833 Family history of diabetes mellitus: Secondary | ICD-10-CM

## 2023-06-15 DIAGNOSIS — K219 Gastro-esophageal reflux disease without esophagitis: Secondary | ICD-10-CM | POA: Diagnosis present

## 2023-06-15 DIAGNOSIS — Z79899 Other long term (current) drug therapy: Secondary | ICD-10-CM | POA: Diagnosis not present

## 2023-06-15 DIAGNOSIS — E11621 Type 2 diabetes mellitus with foot ulcer: Secondary | ICD-10-CM | POA: Diagnosis present

## 2023-06-15 DIAGNOSIS — Z888 Allergy status to other drugs, medicaments and biological substances status: Secondary | ICD-10-CM | POA: Diagnosis not present

## 2023-06-15 DIAGNOSIS — A419 Sepsis, unspecified organism: Secondary | ICD-10-CM

## 2023-06-15 DIAGNOSIS — Z7901 Long term (current) use of anticoagulants: Secondary | ICD-10-CM

## 2023-06-15 DIAGNOSIS — I447 Left bundle-branch block, unspecified: Secondary | ICD-10-CM | POA: Diagnosis present

## 2023-06-15 DIAGNOSIS — L97522 Non-pressure chronic ulcer of other part of left foot with fat layer exposed: Secondary | ICD-10-CM | POA: Diagnosis present

## 2023-06-15 DIAGNOSIS — F17200 Nicotine dependence, unspecified, uncomplicated: Secondary | ICD-10-CM | POA: Diagnosis not present

## 2023-06-15 DIAGNOSIS — L03211 Cellulitis of face: Secondary | ICD-10-CM | POA: Diagnosis present

## 2023-06-15 DIAGNOSIS — J340 Abscess, furuncle and carbuncle of nose: Secondary | ICD-10-CM | POA: Diagnosis present

## 2023-06-15 DIAGNOSIS — I152 Hypertension secondary to endocrine disorders: Secondary | ICD-10-CM | POA: Diagnosis present

## 2023-06-15 DIAGNOSIS — Z7984 Long term (current) use of oral hypoglycemic drugs: Secondary | ICD-10-CM

## 2023-06-15 DIAGNOSIS — D72829 Elevated white blood cell count, unspecified: Secondary | ICD-10-CM

## 2023-06-15 DIAGNOSIS — L0291 Cutaneous abscess, unspecified: Secondary | ICD-10-CM | POA: Diagnosis present

## 2023-06-15 DIAGNOSIS — E119 Type 2 diabetes mellitus without complications: Secondary | ICD-10-CM

## 2023-06-15 DIAGNOSIS — E876 Hypokalemia: Secondary | ICD-10-CM | POA: Diagnosis present

## 2023-06-15 HISTORY — PX: INCISION AND DRAINAGE ABSCESS: SHX5864

## 2023-06-15 LAB — CBC WITH DIFFERENTIAL/PLATELET
Abs Immature Granulocytes: 0.07 10*3/uL (ref 0.00–0.07)
Basophils Absolute: 0.1 10*3/uL (ref 0.0–0.1)
Basophils Relative: 1 %
Eosinophils Absolute: 0.1 10*3/uL (ref 0.0–0.5)
Eosinophils Relative: 0 %
HCT: 40.3 % (ref 36.0–46.0)
Hemoglobin: 13.6 g/dL (ref 12.0–15.0)
Immature Granulocytes: 1 %
Lymphocytes Relative: 11 %
Lymphs Abs: 1.6 10*3/uL (ref 0.7–4.0)
MCH: 31.5 pg (ref 26.0–34.0)
MCHC: 33.7 g/dL (ref 30.0–36.0)
MCV: 93.3 fL (ref 80.0–100.0)
Monocytes Absolute: 1 10*3/uL (ref 0.1–1.0)
Monocytes Relative: 7 %
Neutro Abs: 12 10*3/uL — ABNORMAL HIGH (ref 1.7–7.7)
Neutrophils Relative %: 80 %
Platelets: 336 10*3/uL (ref 150–400)
RBC: 4.32 MIL/uL (ref 3.87–5.11)
RDW: 13.3 % (ref 11.5–15.5)
WBC: 14.7 10*3/uL — ABNORMAL HIGH (ref 4.0–10.5)
nRBC: 0 % (ref 0.0–0.2)

## 2023-06-15 LAB — COMPREHENSIVE METABOLIC PANEL
ALT: 9 U/L (ref 0–44)
AST: 11 U/L — ABNORMAL LOW (ref 15–41)
Albumin: 2.9 g/dL — ABNORMAL LOW (ref 3.5–5.0)
Alkaline Phosphatase: 82 U/L (ref 38–126)
Anion gap: 12 (ref 5–15)
BUN: 15 mg/dL (ref 8–23)
CO2: 26 mmol/L (ref 22–32)
Calcium: 8.7 mg/dL — ABNORMAL LOW (ref 8.9–10.3)
Chloride: 93 mmol/L — ABNORMAL LOW (ref 98–111)
Creatinine, Ser: 0.64 mg/dL (ref 0.44–1.00)
GFR, Estimated: >60 mL/min (ref >60)
Glucose, Bld: 154 mg/dL — ABNORMAL HIGH (ref 70–99)
Potassium: 3.3 mmol/L — ABNORMAL LOW (ref 3.5–5.1)
Sodium: 131 mmol/L — ABNORMAL LOW (ref 135–145)
Total Bilirubin: 0.4 mg/dL (ref <1.2)
Total Protein: 7.9 g/dL (ref 6.5–8.1)

## 2023-06-15 LAB — URINALYSIS, ROUTINE W REFLEX MICROSCOPIC
Bilirubin Urine: NEGATIVE
Glucose, UA: >=500 mg/dL — AB
Hgb urine dipstick: NEGATIVE
Ketones, ur: 20 mg/dL — AB
Nitrite: NEGATIVE
Protein, ur: 100 mg/dL — AB
Specific Gravity, Urine: 1.032 — ABNORMAL HIGH (ref 1.005–1.030)
pH: 7 (ref 5.0–8.0)

## 2023-06-15 LAB — LACTIC ACID, PLASMA
Lactic Acid, Venous: 1.3 mmol/L (ref 0.5–1.9)
Lactic Acid, Venous: 1.5 mmol/L (ref 0.5–1.9)

## 2023-06-15 LAB — CBG MONITORING, ED: Glucose-Capillary: 91 mg/dL (ref 70–99)

## 2023-06-15 LAB — GLUCOSE, CAPILLARY: Glucose-Capillary: 197 mg/dL — ABNORMAL HIGH (ref 70–99)

## 2023-06-15 SURGERY — INCISION AND DRAINAGE, ABSCESS
Anesthesia: General

## 2023-06-15 MED ORDER — SODIUM CHLORIDE 0.9 % IV BOLUS
1000.0000 mL | Freq: Once | INTRAVENOUS | Status: AC
  Administered 2023-06-15: 1000 mL via INTRAVENOUS

## 2023-06-15 MED ORDER — IOHEXOL 300 MG/ML  SOLN
75.0000 mL | Freq: Once | INTRAMUSCULAR | Status: AC | PRN
  Administered 2023-06-15: 75 mL via INTRAVENOUS

## 2023-06-15 MED ORDER — ONDANSETRON HCL 4 MG PO TABS: 4.0000 mg | ORAL_TABLET | Freq: Four times a day (QID) | ORAL | Status: DC | PRN

## 2023-06-15 MED ORDER — PROPOFOL 10 MG/ML IV BOLUS
INTRAVENOUS | Status: AC
  Filled 2023-06-15: qty 20

## 2023-06-15 MED ORDER — PROPOFOL 10 MG/ML IV BOLUS
INTRAVENOUS | Status: DC | PRN
  Administered 2023-06-15: 140 mg via INTRAVENOUS

## 2023-06-15 MED ORDER — MORPHINE SULFATE (PF) 4 MG/ML IV SOLN
4.0000 mg | INTRAVENOUS | Status: DC | PRN
  Administered 2023-06-16: 4 mg via INTRAVENOUS
  Filled 2023-06-15: qty 1

## 2023-06-15 MED ORDER — PHENYLEPHRINE HCL (PRESSORS) 10 MG/ML IV SOLN
INTRAVENOUS | Status: AC
  Filled 2023-06-15: qty 1

## 2023-06-15 MED ORDER — FENTANYL CITRATE (PF) 100 MCG/2ML IJ SOLN
INTRAMUSCULAR | Status: DC | PRN
  Administered 2023-06-15: 50 ug via INTRAVENOUS

## 2023-06-15 MED ORDER — LIDOCAINE HCL (CARDIAC) PF 100 MG/5ML IV SOSY
PREFILLED_SYRINGE | INTRAVENOUS | Status: DC | PRN
  Administered 2023-06-15: 50 mg via INTRAVENOUS

## 2023-06-15 MED ORDER — DROPERIDOL 2.5 MG/ML IJ SOLN: 0.6250 mg | Freq: Once | INTRAMUSCULAR | Status: DC | PRN

## 2023-06-15 MED ORDER — ACETAMINOPHEN 10 MG/ML IV SOLN
INTRAVENOUS | Status: AC
  Filled 2023-06-15: qty 100

## 2023-06-15 MED ORDER — HYDROMORPHONE HCL 1 MG/ML IJ SOLN
1.0000 mg | Freq: Once | INTRAMUSCULAR | Status: DC
  Filled 2023-06-15: qty 1

## 2023-06-15 MED ORDER — FENTANYL CITRATE (PF) 100 MCG/2ML IJ SOLN
INTRAMUSCULAR | Status: AC
  Filled 2023-06-15: qty 2

## 2023-06-15 MED ORDER — SENNOSIDES-DOCUSATE SODIUM 8.6-50 MG PO TABS: 1.0000 | ORAL_TABLET | Freq: Every evening | ORAL | Status: DC | PRN

## 2023-06-15 MED ORDER — HYDROMORPHONE HCL 1 MG/ML IJ SOLN
0.5000 mg | INTRAMUSCULAR | Status: DC | PRN
  Administered 2023-06-15: 0.5 mg via INTRAVENOUS

## 2023-06-15 MED ORDER — ONDANSETRON HCL 4 MG/2ML IJ SOLN
INTRAMUSCULAR | Status: DC | PRN
  Administered 2023-06-15: 4 mg via INTRAVENOUS

## 2023-06-15 MED ORDER — MIDAZOLAM HCL 2 MG/2ML IJ SOLN
INTRAMUSCULAR | Status: AC
  Filled 2023-06-15: qty 2

## 2023-06-15 MED ORDER — DEXAMETHASONE SODIUM PHOSPHATE 10 MG/ML IJ SOLN
INTRAMUSCULAR | Status: DC | PRN
  Administered 2023-06-15: 5 mg via INTRAVENOUS

## 2023-06-15 MED ORDER — GABAPENTIN 300 MG PO CAPS
300.0000 mg | ORAL_CAPSULE | Freq: Three times a day (TID) | ORAL | Status: DC
  Administered 2023-06-15 – 2023-06-16 (×2): 300 mg via ORAL
  Filled 2023-06-15: qty 1
  Filled 2023-06-15: qty 3

## 2023-06-15 MED ORDER — OXYCODONE HCL 5 MG PO TABS: 5.0000 mg | ORAL_TABLET | Freq: Once | ORAL | Status: DC | PRN

## 2023-06-15 MED ORDER — ACETAMINOPHEN 10 MG/ML IV SOLN: 1000.0000 mg | Freq: Once | INTRAVENOUS | Status: DC | PRN

## 2023-06-15 MED ORDER — MIDAZOLAM HCL 2 MG/2ML IJ SOLN
INTRAMUSCULAR | Status: DC | PRN
  Administered 2023-06-15: 2 mg via INTRAVENOUS

## 2023-06-15 MED ORDER — INSULIN ASPART 100 UNIT/ML IJ SOLN
0.0000 [IU] | Freq: Three times a day (TID) | INTRAMUSCULAR | Status: DC
  Administered 2023-06-16: 3 [IU] via SUBCUTANEOUS
  Administered 2023-06-16: 5 [IU] via SUBCUTANEOUS
  Administered 2023-06-17 (×2): 9 [IU] via SUBCUTANEOUS
  Filled 2023-06-15 (×4): qty 1

## 2023-06-15 MED ORDER — SUCCINYLCHOLINE CHLORIDE 200 MG/10ML IV SOSY
PREFILLED_SYRINGE | INTRAVENOUS | Status: DC | PRN
  Administered 2023-06-15: 100 mg via INTRAVENOUS

## 2023-06-15 MED ORDER — LIDOCAINE-EPINEPHRINE 1 %-1:100000 IJ SOLN
INTRAMUSCULAR | Status: AC
  Filled 2023-06-15: qty 1

## 2023-06-15 MED ORDER — CLINDAMYCIN PHOSPHATE 600 MG/50ML IV SOLN
600.0000 mg | Freq: Once | INTRAVENOUS | Status: AC
  Administered 2023-06-15: 600 mg via INTRAVENOUS
  Filled 2023-06-15: qty 50

## 2023-06-15 MED ORDER — SODIUM CHLORIDE 0.9 % IV SOLN: INTRAVENOUS | Status: DC | PRN

## 2023-06-15 MED ORDER — ONDANSETRON HCL 4 MG/2ML IJ SOLN
4.0000 mg | Freq: Once | INTRAMUSCULAR | Status: AC
  Administered 2023-06-15: 4 mg via INTRAVENOUS
  Filled 2023-06-15: qty 2

## 2023-06-15 MED ORDER — FENTANYL CITRATE (PF) 100 MCG/2ML IJ SOLN: 25.0000 ug | INTRAMUSCULAR | Status: DC | PRN

## 2023-06-15 MED ORDER — VANCOMYCIN HCL IN DEXTROSE 1-5 GM/200ML-% IV SOLN
1000.0000 mg | INTRAVENOUS | Status: DC
  Administered 2023-06-16: 1000 mg via INTRAVENOUS
  Filled 2023-06-15 (×2): qty 200

## 2023-06-15 MED ORDER — LIDOCAINE HCL (PF) 2 % IJ SOLN
INTRAMUSCULAR | Status: AC
  Filled 2023-06-15: qty 5

## 2023-06-15 MED ORDER — INSULIN ASPART 100 UNIT/ML IJ SOLN
0.0000 [IU] | Freq: Every day | INTRAMUSCULAR | Status: DC
  Administered 2023-06-16: 4 [IU] via SUBCUTANEOUS
  Filled 2023-06-15: qty 1

## 2023-06-15 MED ORDER — OXYMETAZOLINE HCL 0.05 % NA SOLN
NASAL | Status: AC
  Filled 2023-06-15: qty 30

## 2023-06-15 MED ORDER — OXYCODONE HCL 5 MG/5ML PO SOLN: 5.0000 mg | Freq: Once | ORAL | Status: DC | PRN

## 2023-06-15 MED ORDER — NICOTINE 21 MG/24HR TD PT24: 21.0000 mg | MEDICATED_PATCH | Freq: Every day | TRANSDERMAL | Status: DC | PRN

## 2023-06-15 MED ORDER — PHENYLEPHRINE 80 MCG/ML (10ML) SYRINGE FOR IV PUSH (FOR BLOOD PRESSURE SUPPORT)
PREFILLED_SYRINGE | INTRAVENOUS | Status: DC | PRN
  Administered 2023-06-15: 160 ug via INTRAVENOUS

## 2023-06-15 MED ORDER — MORPHINE SULFATE (PF) 4 MG/ML IV SOLN
4.0000 mg | Freq: Once | INTRAVENOUS | Status: AC
  Administered 2023-06-15: 4 mg via INTRAVENOUS
  Filled 2023-06-15: qty 1

## 2023-06-15 MED ORDER — MORPHINE SULFATE (PF) 2 MG/ML IV SOLN: 2.0000 mg | INTRAVENOUS | Status: DC | PRN

## 2023-06-15 MED ORDER — TRIPLE ANTIBIOTIC 3.5-400-5000 EX OINT
TOPICAL_OINTMENT | CUTANEOUS | Status: AC
  Filled 2023-06-15: qty 1

## 2023-06-15 MED ORDER — HYDROMORPHONE HCL 1 MG/ML IJ SOLN
1.0000 mg | Freq: Once | INTRAMUSCULAR | Status: AC
  Administered 2023-06-15: 1 mg via INTRAVENOUS
  Filled 2023-06-15: qty 1

## 2023-06-15 MED ORDER — METOPROLOL TARTRATE 5 MG/5ML IV SOLN
5.0000 mg | INTRAVENOUS | Status: DC | PRN
  Administered 2023-06-15: 5 mg via INTRAVENOUS
  Filled 2023-06-15: qty 5

## 2023-06-15 MED ORDER — VANCOMYCIN HCL IN DEXTROSE 1-5 GM/200ML-% IV SOLN
1000.0000 mg | Freq: Once | INTRAVENOUS | Status: AC
  Administered 2023-06-15: 1000 mg via INTRAVENOUS
  Filled 2023-06-15: qty 200

## 2023-06-15 MED ORDER — HYDRALAZINE HCL 20 MG/ML IJ SOLN: 5.0000 mg | Freq: Four times a day (QID) | INTRAMUSCULAR | Status: DC | PRN

## 2023-06-15 MED ORDER — BUPIVACAINE HCL (PF) 0.5 % IJ SOLN
INTRAMUSCULAR | Status: DC | PRN
  Administered 2023-06-15: 7 mL

## 2023-06-15 MED ORDER — SODIUM CHLORIDE 0.9 % IV SOLN
2.0000 g | Freq: Two times a day (BID) | INTRAVENOUS | Status: DC
  Administered 2023-06-16 (×2): 2 g via INTRAVENOUS
  Filled 2023-06-15 (×2): qty 12.5

## 2023-06-15 MED ORDER — LIDOCAINE HCL (PF) 4 % IJ SOLN
INTRAMUSCULAR | Status: AC
  Filled 2023-06-15: qty 5

## 2023-06-15 MED ORDER — ONDANSETRON HCL 4 MG/2ML IJ SOLN: 4.0000 mg | Freq: Four times a day (QID) | INTRAMUSCULAR | Status: DC | PRN

## 2023-06-15 MED ORDER — SODIUM CHLORIDE 0.9 % IV SOLN
2.0000 g | Freq: Three times a day (TID) | INTRAVENOUS | Status: DC
  Filled 2023-06-15: qty 12.5

## 2023-06-15 SURGICAL SUPPLY — 22 items
BLADE SURG 15 STRL LF DISP TIS (BLADE) ×2 IMPLANT
DRESSING NASL FOAM PST OP SINU (MISCELLANEOUS) ×4 IMPLANT
DRSG NASAL FOAM POST OP SINU (MISCELLANEOUS) ×2
ELECT REM PT RETURN 9FT ADLT (ELECTROSURGICAL) ×1
ELECTRODE REM PT RTRN 9FT ADLT (ELECTROSURGICAL) ×2 IMPLANT
GAUZE 4X4 16PLY ~~LOC~~+RFID DBL (SPONGE) ×2 IMPLANT
GAUZE PACKING IODOFORM 1/2INX (GAUZE/BANDAGES/DRESSINGS) IMPLANT
GLOVE BIO SURGEON STRL SZ7.5 (GLOVE) ×4 IMPLANT
GOWN STRL REUS W/ TWL LRG LVL3 (GOWN DISPOSABLE) ×4 IMPLANT
LABEL OR SOLS (LABEL) ×2 IMPLANT
MANIFOLD NEPTUNE II (INSTRUMENTS) ×2 IMPLANT
NS IRRIG 500ML POUR BTL (IV SOLUTION) ×2 IMPLANT
PACK HEAD/NECK (MISCELLANEOUS) ×2 IMPLANT
PATTIES SURGICAL .5 X3 (DISPOSABLE) IMPLANT
SPLINT NASAL REUTER .5MM (MISCELLANEOUS) IMPLANT
SPONGE NEURO XRAY DETECT 1X3 (DISPOSABLE) ×2 IMPLANT
SUCTION COAG ELEC 10 HAND CTRL (ELECTROSURGICAL) ×2 IMPLANT
SUT ETHILON 3-0 KS 30 BLK (SUTURE) ×2 IMPLANT
SUT PLAIN GUT 4-0 (SUTURE) ×2 IMPLANT
SWAB CULTURE AMIES ANAERIB BLU (MISCELLANEOUS) IMPLANT
TRAP FLUID SMOKE EVACUATOR (MISCELLANEOUS) ×2 IMPLANT
WATER STERILE IRR 500ML POUR (IV SOLUTION) ×2 IMPLANT

## 2023-06-15 NOTE — Ethics Note (Addendum)
Brief ethics note  D/w Dr Sedalia Muta and Dr Jenne Campus re surgery consent in patient w/ legal guardian, but unable to reach the guardian or other next of kin / surrogate decision makers.   If a patient has capacity (can understand her illness and options for treatment and can communicate wishes regarding treatment) and agrees to or declines a given treatment or procedure, this is always ideal. If there is uncertain capacity or diminished/absent capacity, consent will defer to legal guardian and down the line, etc (see below). However, if surrogate decision maker is not reasonably available then the attending physician and at least one other physician consultant may provide consent.   For any patient with a legal guardian flagged in Epic, there should be legal guardianship paperwork documented. Often there is confusion on "legal guardian" versus "HCPOA" which are different designations.      See: [facility] Statutes Chapter 90. Medicine and Allied Occupations  90-21.13. Informed consent to health care treatment or procedure. In brief summary, this statute outlines that the following persons, in order indicated, are authorized to consent to medical treatment on behalf of the patient who does not demonstrate capacity to do so: Legally assigned guardian appointed by the court > healthcare agent appointed by legal healthcare power of attorney > healthcare agent appointed by the patient > legal spouse > majority of the patient's reasonably available parents and children who are at least 80 years of age > individual who has an established relationship with the patient, who is acting in good faith on behalf of the patient, and who can reliably convey the patient's wishes > attending physician with confirmation by another physician of the patient's condition and the necessity for treatment (unless in urgent/emergent situation)

## 2023-06-15 NOTE — Assessment & Plan Note (Signed)
Suspect secondary to cellulitis and abscess, treat per above Recheck CBC in the a.m.

## 2023-06-15 NOTE — ED Notes (Signed)
See triage note  Presents with redness and swelling nose  States she has been treated for cellulitis   but feels like it is worse

## 2023-06-15 NOTE — Assessment & Plan Note (Signed)
Treated prior to coming in.

## 2023-06-15 NOTE — Anesthesia Preprocedure Evaluation (Signed)
Anesthesia Evaluation  Patient identified by MRN, date of birth, ID band Patient awake    Reviewed: Allergy & Precautions, H&P , NPO status , Patient's Chart, lab work & pertinent test results, reviewed documented beta blocker date and time   Airway Mallampati: II  TM Distance: >3 FB Neck ROM: full    Dental  (+) Teeth Intact   Pulmonary neg pulmonary ROS, pneumonia, resolved, Current Smoker and Patient abstained from smoking.   Pulmonary exam normal        Cardiovascular Exercise Tolerance: Poor hypertension, On Medications negative cardio ROS Normal cardiovascular exam+ dysrhythmias  Rate:Normal     Neuro/Psych Seizures -, Well Controlled,  PSYCHIATRIC DISORDERS Anxiety Depression    negative neurological ROS  negative psych ROS   GI/Hepatic negative GI ROS, Neg liver ROS,GERD  Medicated,,  Endo/Other  negative endocrine ROSdiabetes    Renal/GU Renal diseasenegative Renal ROS  negative genitourinary   Musculoskeletal  (+) Arthritis ,    Abdominal   Peds  Hematology negative hematology ROS (+) Blood dyscrasia, anemia   Anesthesia Other Findings   Reproductive/Obstetrics negative OB ROS                             Anesthesia Physical Anesthesia Plan  ASA: 3 and emergent  Anesthesia Plan: General LMA   Post-op Pain Management:    Induction:   PONV Risk Score and Plan:   Airway Management Planned:   Additional Equipment:   Intra-op Plan:   Post-operative Plan:   Informed Consent: I have reviewed the patients History and Physical, chart, labs and discussed the procedure including the risks, benefits and alternatives for the proposed anesthesia with the patient or authorized representative who has indicated his/her understanding and acceptance.       Plan Discussed with: CRNA  Anesthesia Plan Comments:        Anesthesia Quick Evaluation

## 2023-06-15 NOTE — ED Notes (Signed)
This tech attempted to draw labs from pt x2. Was not able to get blood.

## 2023-06-15 NOTE — ED Provider Notes (Signed)
-----------------------------------------   2:47 PM on 06/15/2023 ----------------------------------------- I have seen and evaluated the patient in conjunction with physician assistant Greig Right.  Patient appears to have fairly significant cellulitis mostly involving the nose but also extending to overlie the maxillary sinus areas especially on the right side.  CT image does show concerns for abscess.  ENT is aware and will likely be taking to the OR at some point.  We will admit to the hospital service to continue on IV antibiotics given the leukocytosis and worsening facial cellulitis.  I discussed this plan of care with the patient she is agreeable.   Minna Antis, MD 06/15/23 [phone]

## 2023-06-15 NOTE — Consult Note (Signed)
Michaela Johnson, Michaela Johnson [id number] 26-Jan-1961 [name], [name], DO  Reason for Consult: Nasal abscess  HPI: 62 year old female who noticed this past Friday with pain in the right side of her nose.  Is progressively gotten worse to the point where she presented to the emergency room for pain and swelling of the nose.  CT scan performed showed an abscess.  ENT was consulted.  Allergies:  Allergies  Allergen Reactions   Glipizide     Other reaction(s): Other (See Comments)   Metformin Diarrhea   Codeine Itching   Levaquin [Levofloxacin In D5w] Itching    ROS: Review of systems normal other than 12 systems except per HPI.  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   Constipation    Depression    Diabetes mellitus without complication (HCC)    Dysrhythmia     PAF   GERD (gastroesophageal reflux disease)    Hypertension    Neuropathy    Nicotine dependence 07/31/2020   PAF (paroxysmal atrial fibrillation) (HCC)    Panic attacks    Pneumonia    Tobacco use    Vitamin D deficiency     FH:  Family History  Problem Relation Age of Onset   Diabetes Mother    CAD Father    Heart Problems Father    Heart failure Father     SH:  Social History   Socioeconomic History   Marital status: Married    Spouse name: Not on file   Number of children: Not on file   Years of education: Not on file   Highest education level: Not on file  Occupational History   Not on file  Tobacco Use   Smoking status: Every Day    Current packs/day: 1.00    Average packs/day: 1 pack/day for 41.0 years (41.0 ttl pk-yrs)    Types: Cigarettes, E-cigarettes, Cigars   Smokeless tobacco: Never  Vaping Use   Vaping status: Never Used  Substance and Sexual Activity   Alcohol use: Yes    Alcohol/week: 6.0 standard  drinks of alcohol    Types: 6 Standard drinks or equivalent per week    Comment: last use yesterday x 1 beer   Drug use: Not Currently    Frequency: 7.0 times per week    Types: Marijuana    Comment: 3 days every week   Sexual activity: Yes    Partners: Male  Other Topics Concern   Not on file  Social History Narrative   Not on file   Social Determinants of Health   Financial Resource Strain: Not on file  Food Insecurity: No Food Insecurity (06/14/2022)   Hunger Vital Sign    Worried About Running Out of Food in the Last Year: Never true    Ran Out of Food in the Last Year: Never true  Transportation Needs: Patient Declined (06/14/2022)   PRAPARE - Administrator, Civil Service (Medical): Patient declined    Lack of Transportation (Non-Medical): Patient declined  Physical Activity: Not on file  Stress: Not on file  Social Connections: Not on file  Intimate Partner Violence: Not At Risk (06/14/2022)   Humiliation, Afraid, Rape, and Kick questionnaire    Fear of Current or Ex-Partner: No    Emotionally Abused:  No    Physically Abused: No    Sexually Abused: No    PSH:  Past Surgical History:  Procedure Laterality Date   ANKLE FUSION Right 07/30/2020   Procedure: ANKLE FUSION;  Surgeon: Myrene Galas, MD;  Location[facility] OR;  Service: Orthopedics;  Laterality: Right;   APPLICATION OF WOUND VAAPPLICATION OF WOUND VAC (Right Ankle)C (Right Ankle)  07/31/2020   APPLICATION OF WOUND VAC Right 07/30/2020   Procedure: APPLICATION OF WOUND VAC;  Surgeon: Myrene Galas, MD;  Location: [facility] OR;  Service: Orthopedics;  Laterality: Right;   HARDWARE REMOVAL Right 07/30/2020   Procedure: HARDWARE REMOVAL RIGHT ANKLE;  Surgeon: Myrene Galas, MD;  Location: [facility] OR;  Service: Orthopedics;  Laterality: Right;   HARDWARE REMOVAL RIGHT ANKLE (Right Ankle  07/31/2020   INTRAMEDULLARY (IM) NAIL INTERTROCHANTERIC Right 01/04/2022   Procedure: INTRAMEDULLARY (IM) NAIL INTERTROCHANTRIC;   Surgeon: Christena Flake, MD;  Location: [facility] ORS;  Service: Orthopedics;  Laterality: Right;   ORIF ANKLE FRACTURE Right 07/04/2020   Procedure: OPEN REDUCTION INTERNAL FIXATION (ORIF) ANKLE FRACTURE;  Surgeon: Lyndle Herrlich, MD;  Location: [facility] ORS;  Service: Orthopedics;  Laterality: Right;    Physical  Exam: Patient is awake and alert in the preop area.  She has obvious swelling of the nose.  Intranasal exam shows abscess formation most likely forming inside the right nasal ala.  Heart had a regular rate and rhythm lungs were clear to auscultation.  The oral cavity oropharynx benign the external ears appear normal.  CT scan reviewed shows obvious abscess of the nasal tip.   A/P: Abscess of the nasal cavity plan will take her emergently to the operating room for incision and drainage we discussed risk and benefits including bleeding infection persistent abscess and she is eager to proceed.   [name] 06/15/2023 4:23 PM

## 2023-06-15 NOTE — Anesthesia Postprocedure Evaluation (Signed)
Anesthesia Post Note  Patient: Michaela Johnson  Procedure(s) Performed: INCISION AND DRAINAGE NASAL ABSCESS  Patient location during evaluation: PACU Anesthesia Type: General Level of consciousness: awake and alert Pain management: pain level controlled Vital Signs Assessment: post-procedure vital signs reviewed and stable Respiratory status: spontaneous breathing, nonlabored ventilation, respiratory function stable and patient connected to nasal cannula oxygen Cardiovascular status: blood pressure returned to baseline and stable Postop Assessment: no apparent nausea or vomiting Anesthetic complications: no   No notable events documented.   Last Vitals:  Vitals:   06/15/23 1846 06/15/23 1956  BP: (!) 157/84 112/62  Pulse: (!) 119 (!) 117  Resp: 18 18  Temp: 37.3 C 37.8 C  SpO2: 99% 100%    Last Pain:  Vitals:   06/15/23 1956  TempSrc: Oral  PainSc:                  Yevette Edwards

## 2023-06-15 NOTE — ED Notes (Signed)
Pt with difficult access. Abx started after 1 blood culture obtained to prevent delay.

## 2023-06-15 NOTE — H&P (Addendum)
History and Physical   Michaela Johnson ZOX:[id number] DOB: 11-Jan-1961 DOA: 06/15/2023  PCP: Sandrea Hughs, NP  Outpatient Specialists: Dr. Mariah Milling  I have personally briefly reviewed patient's old medical records in [facility] EMR.  Chief Concern: nose infection  HPI: Michaela Johnson is a 62 year old female with history of hypertension, non-insulin-dependent diabetes mellitus, GERD, history of head lice, who presents emergency department for chief concerns of nose infection.  Patient reports this has been ongoing for about 1 week.  Vitals in the ED showed temperature of 98, respiration rate 16, heart rate of 105, blood pressure 151/101, SpO2 100% on room air.  Serum sodium is 131, potassium 3.3, chloride 93, bicarb 26, BUN of 15, serum creatinine 0.64, EGFR greater than 60, nonfasting blood glucose 154, WBC 14.7, hemoglobin 13.6, platelets of 336.  Lactic acid was 1.3 and on repeat was 1.5.  UA was positive for moderate leukocytes greater than 500 glucose, rare bacteria, and protein of 100.  CT maxillofacial with contrast: Read as 3.3 x 1.6 x 2.1 cm soft tissue abscess in the prenasal soft tissue extending inferiorly to the level of the columella and superiorly to the level of the nasal bridge.  Abscess also extends posteriorly to involve the anterior nasal septum.  ED treatment: Dilaudid 1 mg IV one-time dose, morphine 4 mg IV, ondansetron 4 mg IV, clindamycin 600 mg IV, sodium chloride 1 L bolus.  EDP consulted Dr. Jenne Campus who is aware and will take patient to the OR for incision and drainage. ------------------------------------- At bedside, patient was able to tell me her name, age, location, current calendar year.  She reports that her nose has been bothering her since last Friday, 06/05/2023.  Patient reports she had mild fever yesterday, however she is not certain because she did not check her temperature.  She endorses nausea currently because she is hungry.  Denies  vomiting.  She reports she has never had this happen before.  She does not know why her nose would start to have an infection, she denies known trauma.  She denies chest pain, shortness of breath, abdominal pain, dysuria, hematuria, diarrhea.  Social history: She lives at home on her own.  She denies recreational drug use.  She reports she smokes about 3 cigarettes a day.  She reports infrequent EtOH use, last whiskey shot was about a week ago because she had a sore throat.  ROS: Constitutional: no weight change, + fever ENT/Mouth: no sore throat, no rhinorrhea Eyes: no eye pain, no vision changes Cardiovascular: no chest pain, no dyspnea,  no edema, no palpitations Respiratory: no cough, no sputum, no wheezing Gastrointestinal: no nausea, no vomiting, no diarrhea, no constipation Genitourinary: no urinary incontinence, no dysuria, no hematuria Musculoskeletal: no arthralgias, no myalgias Skin: + Nose redness and pain Neuro: no weakness, no loss of consciousness, no syncope Psych: no anxiety, no depression, no decrease appetite Heme/Lymph: no bruising, no bleeding  ED Course: Discussed with EDP, patient requiring hospitalization for chief concerns of cellulitis and abscess of the prenasal soft tissue.  Assessment/Plan  Principal Problem:   Abscess Active Problems:   PAF (paroxysmal atrial fibrillation) (HCC)   Diabetes (HCC)   Hypertension associated with diabetes (HCC)   Nicotine dependence   Neuropathy   Smoker   Leukocytosis   Head lice   Assessment and Plan:  * Abscess Prenasal soft tissue abscess and cellulitis approximately 3.3 x 1.6 x 2.1 cm Continue with cefepime and vancomycin per pharmacy Patient understands that there is an infection  with purulence and that we will need to drain the purulence out Patient is agreeable to surgical intervention further removal of the purulence/infection ENT has been consulted for I&D Tentatively scheduled for surgery at 4 PM on  day of admission N.p.o. in anticipation of OR Admit to telemetry medical, inpatient  PAF (paroxysmal atrial fibrillation) (HCC) Home Xarelto not resumed on admission due to I&D with ENT provider AM team to resume home Xarelto when the benefits outweigh the risk  Diabetes (HCC) Non-insulin-dependent diabetes mellitus Home Jardiance not resumed on admission Insulin SSI with at bedtime coverage ordered  Neuropathy PDMP reviewed Home gabapentin 300 mg, 3 times daily resumed on admission  Head lice Patient reports that she has had 3 treatments of head lice over-the-counter Per CDC, patient does not need to be quarantine Contact precaution ordered  Leukocytosis Suspect secondary to cellulitis and abscess, treat per above Recheck CBC in the a.m.  Smoker As needed nicotine patch ordered Patient understands that smoking is not good for her and that she is on her way to titrating down the cigarettes Patient not ready to fully quit at this time  Chart reviewed.   DVT prophylaxis: TED hose Code Status: Full code Diet: N.p.o. Family Communication: Attempted to call patient's friend whom sometimes lives with her, dose of brand of [phone], left a voicemail.  Tried [phone], also no pickup.  Called Calvin May at [phone], no pickup, could not understand the voicemail greeting, did not leave a voicemail. Disposition Plan: Ending ENT evaluation Consults called: ENT, Dr. 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/30/2020   Constipation    Depression    Diabetes mellitus without complication (HCC)    Dysrhythmia     PAF   GERD (gastroesophageal reflux disease)    Hypertension    Neuropathy    Nicotine dependence 07/31/2020   PAF  (paroxysmal atrial fibrillation) (HCC)    Panic attacks    Pneumonia    Tobacco use    Vitamin D deficiency    Past Surgical History:  Procedure Laterality Date   ANKLE FUSION Right 07/30/2020   Procedure: ANKLE FUSION;  Surgeon: Myrene Galas, MD;  Location: [facility] OR;  Service: Orthopedics;  Laterality: Right;   APPLICATION OF WOUND VAAPPLICATION OF WOUND VAC (Right Ankle)C (Right Ankle)  07/31/2020   APPLICATION OF WOUND VAC Right 07/30/2020   Procedure: APPLICATION OF WOUND VAC;  Surgeon: Myrene Galas, MD;  Location: [facility] OR;  Service: Orthopedics;  Laterality: Right;   HARDWARE REMOVAL Right 07/30/2020   Procedure: HARDWARE REMOVAL RIGHT ANKLE;  Surgeon: Myrene Galas, MD;  Location: [facility] OR;  Service: Orthopedics;  Laterality: Right;   HARDWARE REMOVAL RIGHT ANKLE (Right Ankle  07/31/2020   INTRAMEDULLARY (IM) NAIL INTERTROCHANTERIC Right 01/04/2022   Procedure: INTRAMEDULLARY (IM) NAIL INTERTROCHANTRIC;  Surgeon: Christena Flake, MD;  Location: [facility] ORS;  Service: Orthopedics;  Laterality: Right;   ORIF ANKLE FRACTURE Right 07/04/2020   Procedure: OPEN REDUCTION INTERNAL FIXATION (ORIF) ANKLE FRACTURE;  Surgeon: Lyndle Herrlich, MD;  Location: [facility] ORS;  Service: Orthopedics;  Laterality: Right;   Social History:  reports that she has been smoking cigarettes, e-cigarettes, and cigars. She has a 41 pack-year smoking history. She has never used smokeless tobacco. She reports  current alcohol use of about 6.0 standard drinks of alcohol per week. She reports that she does not currently use drugs after having used the following drugs: Marijuana. Frequency: 7.00 times per week.  Allergies  Allergen Reactions   Glipizide     Other reaction(s): Other (See Comments)   Metformin Diarrhea   Codeine Itching   Levaquin [Levofloxacin In D5w] Itching   Family History  Problem Relation Age of Onset   Diabetes Mother    CAD Father    Heart Problems Father    Heart failure Father    Family history:  Family history reviewed and not pertinent.  Prior to Admission medications   Medication Sig Start Date End Date Taking? Authorizing Provider  acetaminophen (TYLENOL) 500 MG tablet Take 1-2 tablets (500-1,000 mg total) by mouth every 6 (six) hours as needed for moderate pain. Patient not taking: Reported on 01/30/2023 01/06/22   Anson Oregon, PA-C  albuterol (PROVENTIL HFA;VENTOLIN HFA) 108 (90 Base) MCG/ACT inhaler Inhale 2 puffs into the lungs every 6 (six) hours as needed for wheezing or shortness of breath. Patient not taking: Reported on 01/30/2023 02/14/18   Ihor Austin, MD  Cholecalciferol (VITAMIN D) 125 MCG (5000 UT) CAPS Take 1 capsule by mouth daily. 08/03/20   Montez Morita, PA-C  diphenhydrAMINE (BENADRYL) 12.5 MG/5ML liquid Take 25 mg by mouth 4 (four) times daily as needed for itching. Patient not taking: Reported on 01/30/2023    [provider]  docusate sodium (COLACE) 100 MG capsule Take 1 capsule (100 mg total) by mouth 2 (two) times daily. Patient not taking: Reported on 01/30/2023 01/07/22   Arnetha Courser, MD  gabapentin (NEURONTIN) 300 MG capsule Take 900 mg by mouth at bedtime. 02/28/20   [provider]  JARDIANCE 10 MG TABS tablet Take 10 mg by mouth daily. 11/25/21   [provider]  LANTUS SOLOSTAR 100 UNIT/ML Solostar Pen Inject 37 Units into the skin daily. This is your home dose of long-acting insulin. 04/20/21   Darlin Priestly, MD  metoprolol succinate (TOPROL-XL) 25 MG 24 hr tablet Take 1 tablet (25 mg total) by mouth daily. Take with or immediately following a meal. 01/30/23   Gollan, Tollie Pizza, MD  Multiple Vitamin (MULTIVITAMIN WITH MINERALS) TABS tablet Take 1 tablet by mouth daily. 01/07/22   Arnetha Courser, MD  NOVOLOG FLEXPEN 100 UNIT/ML FlexPen Inject 36 Units into the skin daily. 12/19/22   [provider]  omeprazole (PRILOSEC) 20 MG capsule Take 20 mg by mouth daily.    [provider]  rivaroxaban (XARELTO) 20 MG TABS  tablet Take 1 tablet (20 mg total) by mouth daily with supper. 01/30/23   Antonieta Iba, MD  VICTOZA 18 MG/3ML SOPN Inject 4 mg into the skin daily. 11/20/22   [provider]   Physical Exam: Vitals:   06/15/23 1754 06/15/23 1800 06/15/23 1813 06/15/23 1846  BP:  (!) 153/84 (!) 155/85 (!) 157/84  Pulse: (!) 120 (!) 118 (!) 120 (!) 119  Resp: 17 16 15 18   Temp:   99 F (37.2 C) 99.1 F (37.3 C)  TempSrc:    Oral  SpO2: (!) 87% 94% 100% 99%  Weight:      Height:       Constitutional: appears older than chronological age, NAD, calm Eyes: PERRL, lids and conjunctivae normal ENMT: Mucous membranes are moist. Posterior pharynx clear of any exudate or lesions. Age-appropriate dentition. Hearing appropriate Neck: normal, supple, no masses, no thyromegaly Respiratory: clear  to auscultation bilaterally, no wheezing, no crackles. Normal respiratory effort. No accessory muscle use.  Cardiovascular: Regular rate and rhythm, no murmurs / rubs / gallops. No extremity edema. 2+ pedal pulses. No carotid bruits.  Abdomen: no tenderness, no masses palpated, no hepatosplenomegaly. Bowel sounds positive.  Musculoskeletal: no clubbing / cyanosis. No joint deformity upper and lower extremities. Good ROM, no contractures, no atrophy. Normal muscle tone.  Skin: Her nose exhibits erythema and edema      Neurologic: Sensation intact. Strength 5/5 in all 4.  Psychiatric: Normal judgment and insight. Alert and oriented x 3. Normal mood.   EKG: independently reviewed, showing sinus tachycardia with rate of 123, QTc 515, left bundle branch block present on prior EKG on 01/02/2022  Chest x-ray on Admission: Not indicated at this time  CT Maxillofacial W Contrast  Result Date: 06/15/2023 CLINICAL DATA:  Nasal abscess cellulitis EXAM: CT MAXILLOFACIAL WITH CONTRAST TECHNIQUE: Multidetector CT imaging of the maxillofacial structures was performed with intravenous contrast. Multiplanar CT image  reconstructions were also generated. RADIATION DOSE REDUCTION: This exam was performed according to the departmental dose-optimization program which includes automated exposure control, adjustment of the mA and/or kV according to patient size and/or use of iterative reconstruction technique. CONTRAST:  75mL OMNIPAQUE IOHEXOL 300 MG/ML  SOLN COMPARISON:  None Available. FINDINGS: Osseous: No fracture or mandibular dislocation. No destructive process. Orbits: Negative. No traumatic or inflammatory finding. Sinuses: Mild mucosal thickening of the floor of the right maxillary sinus and in the left sphenoid sinus. Soft tissues: There is a 3.3 x 1.6 x 2.1 cm peripherally enhancing fluid collection along the pre nasal soft tissues and the columella (series 3, image). Abscess also extends posteriorly to involve the nasal septum (series 3, image 55). Limited intracranial: No significant or unexpected finding. IMPRESSION: 3.3 x 1.6 x 2.1 cm soft tissue abscess in the pre nasal soft tissues extending inferiorly to the level of the columella and superiorly to the level of the nasal bridge. Abscess also extends posteriorly to involve the anterior nasal septum. Electronically Signed   By: Lorenza Cambridge M.D.   On: 06/15/2023 12:54    Labs on Admission: I have personally reviewed following labs CBC: Recent Labs  Lab 06/15/23 1044  WBC 14.7*  NEUTROABS 12.0*  HGB 13.6  HCT 40.3  MCV 93.3  PLT 336   Basic Metabolic Panel: Recent Labs  Lab 06/15/23 1044  NA 131*  K 3.3*  CL 93*  CO2 26  GLUCOSE 154*  BUN 15  CREATININE 0.64  CALCIUM 8.7*   GFR: Estimated Creatinine Clearance: 56.2 mL/min (by C-G formula based on SCr of 0.64 mg/dL).  Liver Function Tests: Recent Labs  Lab 06/15/23 1044  AST 11*  ALT 9  ALKPHOS 82  BILITOT 0.4  PROT 7.9  ALBUMIN 2.9*   Urine analysis:    Component Value Date/Time   COLORURINE YELLOW (A) 06/15/2023 0855   APPEARANCEUR HAZY (A) 06/15/2023 0855   LABSPEC 1.032  (H) 06/15/2023 0855   PHURINE 7.0 06/15/2023 0855   GLUCOSEU >=500 (A) 06/15/2023 0855   HGBUR NEGATIVE 06/15/2023 0855   BILIRUBINUR NEGATIVE 06/15/2023 0855   KETONESUR 20 (A) 06/15/2023 0855   PROTEINUR 100 (A) 06/15/2023 0855   NITRITE NEGATIVE 06/15/2023 0855   LEUKOCYTESUR MODERATE (A) 06/15/2023 0855   This document was prepared using Dragon Voice Recognition software and may include unintentional dictation errors.  Dr. Sedalia Muta [facility]  If 7PM-7AM, please contact overnight-coverage provider If 7AM-7PM, please contact day attending  provider [email or web address]  06/15/2023, 7:05 PM

## 2023-06-15 NOTE — Hospital Course (Signed)
Ms. Michaela Johnson is a 62 year old female with history of hypertension, non-insulin-dependent diabetes mellitus, GERD, history of head lice, who presents emergency department for chief concerns of nose infection.  Patient reports this has been ongoing for about 1 week.  Vitals in the ED showed temperature of 98, respiration rate 16, heart rate of 105, blood pressure 151/101, SpO2 100% on room air.  Serum sodium is 131, potassium 3.3, chloride 93, bicarb 26, BUN of 15, serum creatinine 0.64, EGFR greater than 60, nonfasting blood glucose 154, WBC 14.7, hemoglobin 13.6, platelets of 336.  Lactic acid was 1.3 and on repeat was 1.5.  UA was positive for moderate leukocytes greater than 500 glucose, rare bacteria, and protein of 100.  CT maxillofacial with contrast: Read as 3.3 x 1.6 x 2.1 cm soft tissue abscess in the prenasal soft tissue extending inferiorly to the level of the columella and superiorly to the level of the nasal bridge.  Abscess also extends posteriorly to involve the anterior nasal septum.  ED treatment: Dilaudid 1 mg IV one-time dose, morphine 4 mg IV, ondansetron 4 mg IV, clindamycin 600 mg IV, sodium chloride 1 L bolus.  EDP consulted Dr. Jenne Campus who is aware and will take patient to the OR for incision and drainage.

## 2023-06-15 NOTE — Assessment & Plan Note (Addendum)
Nicotine patch

## 2023-06-15 NOTE — Assessment & Plan Note (Signed)
Home Xarelto not resumed on admission due to I&D with ENT provider AM team to resume home Xarelto when the benefits outweigh the risk

## 2023-06-15 NOTE — ED Notes (Signed)
Pt to be transported to the OR at this time

## 2023-06-15 NOTE — ED Provider Notes (Addendum)
[facility] Provider Note    Event Date/Time   First MD Initiated Contact with Patient 06/15/23 [phone]     (approximate)   History   Facial Swelling   HPI  Michaela Johnson is a 62 y.o. female with history of hypertension, anxiety, diabetes, EtOH use, and arthritis presents emergency department with a red swollen nose.  Roommate states that it has been that way for about a week maybe longer.  Patient states becoming very painful at this time.  Denies fever or chills.  Has not taken the antibiotic.      Physical Exam   Triage Vital Signs: ED Triage Vitals [06/15/23 0913]  Encounter Vitals Group     BP (!) 151/101     Systolic BP Percentile      Diastolic BP Percentile      Pulse Rate (!) 105     Resp 16     Temp 98 F (36.7 C)     Temp Source Oral     SpO2 100 %     Weight      Height      Head Circumference      Peak Flow      Pain Score 10     Pain Loc      Pain Education      Exclude from Growth Chart     Most recent vital signs: Vitals:   06/15/23 0913 06/15/23 1339  BP: (!) 151/101 (!) 160/85  Pulse: (!) 105 (!) 122  Resp: 16 18  Temp: 98 F (36.7 C) 99 F (37.2 C)  SpO2: 100% 100%     General: Awake, no distress.   CV:  Good peripheral perfusion.  Tachycardic  resp:  Normal effort. Lungs CTA Abd:  No distention.   Other:  Nose is bright red, swollen, tender along the tip and into the maxillary sinus area, see media picture   ED Results / Procedures / Treatments   Labs (all labs ordered are listed, but only abnormal results are displayed) Labs Reviewed  COMPREHENSIVE METABOLIC PANEL - Abnormal; Notable for the following components:      Result Value   Sodium 131 (*)    Potassium 3.3 (*)    Chloride 93 (*)    Glucose, Bld 154 (*)    Calcium 8.7 (*)    Albumin 2.9 (*)    AST 11 (*)    All other components within normal limits  CBC WITH DIFFERENTIAL/PLATELET - Abnormal; Notable for the following components:   WBC  14.7 (*)    Neutro Abs 12.0 (*)    All other components within normal limits  URINALYSIS, ROUTINE W REFLEX MICROSCOPIC - Abnormal; Notable for the following components:   Color, Urine YELLOW (*)    APPearance HAZY (*)    Specific Gravity, Urine 1.032 (*)    Glucose, UA >=500 (*)    Ketones, ur 20 (*)    Protein, ur 100 (*)    Leukocytes,Ua MODERATE (*)    Bacteria, UA RARE (*)    All other components within normal limits  CULTURE, BLOOD (ROUTINE X 2)  CULTURE, BLOOD (ROUTINE X 2)  LACTIC ACID, PLASMA  LACTIC ACID, PLASMA     EKG     RADIOLOGY CT maxillofacial with contrast for abscess    PROCEDURES:   .Critical Care E&M  Performed by: Faythe Ghee, PA-C Critical care provider statement:    Critical care time (minutes):  45   Critical care  time was exclusive of:  Separately billable procedures and treating other patients   Critical care was necessary to treat or prevent imminent or life-threatening deterioration of the following conditions:  Sepsis   Critical care was time spent personally by me on the following activities:  Blood draw for specimens, development of treatment plan with patient or surrogate, discussions with consultants, evaluation of patient's response to treatment, examination of patient, obtaining history from patient or surrogate, ordering and performing treatments and interventions, ordering and review of laboratory studies, ordering and review of radiographic studies and re-evaluation of patient's condition   Care discussed with: admitting provider   After initial E/M assessment, critical care services were subsequently performed that were exclusive of separately billable procedures or treatment.      MEDICATIONS ORDERED IN ED: Medications  senna-docusate (Senokot-S) tablet 1 tablet (has no administration in time range)  ondansetron (ZOFRAN) tablet 4 mg (has no administration in time range)    Or  ondansetron (ZOFRAN) injection 4 mg (has no  administration in time range)  hydrALAZINE (APRESOLINE) injection 5 mg (has no administration in time range)  morphine (PF) 2 MG/ML injection 2 mg (has no administration in time range)  morphine (PF) 4 MG/ML injection 4 mg (has no administration in time range)  HYDROmorphone (DILAUDID) injection 0.5 mg (0.5 mg Intravenous Given 06/15/23 1413)  vancomycin (VANCOCIN) IVPB 1000 mg/200 mL premix (1,000 mg Intravenous New Bag/Given 06/15/23 1428)  HYDROmorphone (DILAUDID) injection 1 mg (has no administration in time range)  sodium chloride 0.9 % bolus 1,000 mL (has no administration in time range)  ceFEPIme (MAXIPIME) 2 g in sodium chloride 0.9 % 100 mL IVPB (has no administration in time range)  vancomycin (VANCOCIN) IVPB 1000 mg/200 mL premix (has no administration in time range)  metoprolol tartrate (LOPRESSOR) injection 5 mg (has no administration in time range)  nicotine (NICODERM CQ - dosed in mg/24 hours) patch 21 mg (has no administration in time range)  insulin aspart (novoLOG) injection 0-5 Units (has no administration in time range)  insulin aspart (novoLOG) injection 0-9 Units (has no administration in time range)  sodium chloride 0.9 % bolus 1,000 mL (0 mLs Intravenous Stopped 06/15/23 1426)  morphine (PF) 4 MG/ML injection 4 mg (4 mg Intravenous Given 06/15/23 1021)  ondansetron (ZOFRAN) injection 4 mg (4 mg Intravenous Given 06/15/23 1021)  clindamycin (CLEOCIN) IVPB 600 mg (0 mg Intravenous Stopped 06/15/23 1141)  HYDROmorphone (DILAUDID) injection 1 mg (1 mg Intravenous Given 06/15/23 1143)  iohexol (OMNIPAQUE) 300 MG/ML solution 75 mL (75 mLs Intravenous Contrast Given 06/15/23 1225)     IMPRESSION / MDM / ASSESSMENT AND PLAN / ED COURSE  I reviewed the triage vital signs and the nursing notes.                              Differential diagnosis includes, but is not limited to, nasal abscess, cellulitis, thrombosis, sepsis  Patient's presentation is most consistent with  acute presentation with potential threat to life or bodily function.   Patient is a little tachycardic so we will go ahead and do sepsis protocols.  Labs, CT maxillofacial with contrast ordered, patient was given clindamycin 600 mg IV, morphine 4 mg IV and Zofran 4 mg IV  Plan at this time is to admit.  I am awaiting lab work to aid in admission.   Labs with elevated WBC of 14.7, glucose elevated 154, sodium is a little decreased at  131 and so was potassium at 3.3, remainder lab works reassuring, lactic is normal  CT maxillofacial with contrast independently reviewed and interpreted the radiologist reading as being positive for soft tissue fluid collection/abscess at the nose  Consult to ENT, spoke with Dr. Jenne Campus.  Will take patient to the OR due to the abscess  Consult to hospitalist for admission, spoke with Dr. Sedalia Muta, patient is stable at this time.  She is aware patient will be going to the OR and we need to keep the patient n.p.o.  On recheck of the patient her heart rate has increased to 122, will add another liter of fluids, additional pain medication.  EKG ordered  Patient states that legal guardian is her daughter but she does not know her last name or phone number anymore if they are not speaking.  I did speak with the patient's friend that was here with her earlier at the patient's permission.  Felicity Pellegrini, [phone].  States he knows where the daughter lives and could go by if needed.  However feel the patient can make her own decisions at this time.  She seems to be very well and understands the procedure that is scheduled for later today.  EKG ordered  EKG shows sinus tachycardia, see physician read  FINAL CLINICAL IMPRESSION(S) / ED DIAGNOSES   Final diagnoses:  Cellulitis of nasal tip  Nasal abscess  Acute sepsis (HCC)     Rx / DC Orders   ED Discharge Orders     None        Note:  This document was prepared using Dragon voice recognition software and  may include unintentional dictation errors.    Faythe Ghee, PA-C 06/15/23 1411    Faythe Ghee, PA-C 06/15/23 1507    Minna Antis, MD 06/15/23 [phone]

## 2023-06-15 NOTE — Assessment & Plan Note (Signed)
PDMP reviewed Home gabapentin 300 mg, 3 times daily resumed on admission

## 2023-06-15 NOTE — Assessment & Plan Note (Signed)
Non-insulin-dependent diabetes mellitus Home Jardiance not resumed on admission Insulin SSI with at bedtime coverage ordered

## 2023-06-15 NOTE — Op Note (Addendum)
06/15/2023  5:29 PM    Michaela Johnson  [id number]   Pre-Op Dx: Cellulitus of nasal tip  Post-op Dx: SAME  Proc: Incision and drainage of nasal abscess  Surg:  [name]  Anes:  GOT  EBL: Less than 10 cc  Comp: None  Findings: Approximately 5 to 8 cc of pus within a pocket in the superior aspect of the septum and nasal tip  Procedure: [name] was identified in the holding area taken the operating room placed in supine position.  After general trach anesthesia the table was turned 90 degrees.  The patient was draped in usual fashion for nasal surgery.  Examination of the nose shows an obvious wideness in the superior aspect of the septum anteriorly extending up to the tip.  A 15 blade was used to incise through the mucosa on the right-hand side into this area.  Immediately pus was obtained.  A curved hemostat was then used to probe into this pocket and it was opened widely.  Immediately there was a splash of pus coming from this pocket approximately 5-8 cc of pus were extracted.  This was sent for culture.  The nose was then reprobed there was no evidence of residual pus.  The wound was then irrigated with saline.  Half-inch iodoform gauze was then placed within this pocket and brought out of the nose and taped to the face on the right-hand side. 8ml % plain Marcaine injected in and around the nose for pain control.  The patient was then returned to anesthesia where she was awakened in the operating type cover room in stable condition.  Culture: Right nasal abscess  Dispo:   Good  Plan: Admit to the hospitalist service for IV antibiotics.  Most likely staph related would suggest coverage with vancomycin until the culture and sensitivities have returned.  [name]  06/15/2023 5:29 PM

## 2023-06-15 NOTE — ED Triage Notes (Signed)
Pt here with cellulitis on her nose. Pt states she has not been given abx on triage. Pt states severe pain in her nose. Pt states she has a mild fever this weekend. Pt nose is noticeably red in color.

## 2023-06-15 NOTE — Anesthesia Procedure Notes (Signed)
Procedure Name: Intubation Date/Time: 06/15/2023 4:53 PM  Performed by: Irving Burton, CRNAPre-anesthesia Checklist: Patient identified, Patient being monitored, Timeout performed, Emergency Drugs available and Suction available Patient Re-evaluated:Patient Re-evaluated prior to induction Oxygen Delivery Method: Circle system utilized Preoxygenation: Pre-oxygenation with 100% oxygen Induction Type: IV induction and Rapid sequence Ventilation: Mask ventilation without difficulty Laryngoscope Size: 3 and McGrath Grade View: Grade I Tube type: Oral Tube size: 7.0 mm Number of attempts: 1 Airway Equipment and Method: Stylet and Video-laryngoscopy Placement Confirmation: ETT inserted through vocal cords under direct vision, positive ETCO2 and breath sounds checked- equal and bilateral Secured at: 21 cm Tube secured with: Tape Dental Injury: Teeth and Oropharynx as per pre-operative assessment

## 2023-06-15 NOTE — Progress Notes (Signed)
Pharmacy Antibiotic Note  Michaela Johnson is a 62 y.o. female admitted on 06/15/2023 with cellulitis.  PMH significant for HTN, T2DM, and GERD. In ED, patient is afebrile with WBC 14.7. Pharmacy has been consulted for vancomycin and cefepime dosing.  Plan: Start cefepime 2 g IV every 12 hours Vancomycin load of 1000 mg IV x1 given in ED Start vancomycin 1000 mg IV every 24 hours (eAUC 533.6, Cmin 11.9, Scr 0.8, IBW, Vd 0.72 L/kg) Monitor renal function, clinical status, culture data, and LOT  Height: 5\' 2"  (157.5 cm) Weight: 51 kg (112 lb 7 oz) IBW/kg (Calculated) : 50.1  Temp (24hrs), Avg:98.5 F (36.9 C), Min:98 F (36.7 C), Max:99 F (37.2 C)  Recent Labs  Lab 06/15/23 1044 06/15/23 1142  WBC 14.7*  --   CREATININE 0.64  --   LATICACIDVEN 1.3 1.5    Estimated Creatinine Clearance: 56.2 mL/min (by C-G formula based on SCr of 0.64 mg/dL).    Allergies  Allergen Reactions   Glipizide     Other reaction(s): Other (See Comments)   Metformin Diarrhea   Codeine Itching   Levaquin [Levofloxacin In D5w] Itching   Antimicrobials this admission: cefepime 11/25 >>  vancomycin 11/25 >>  Clindamycin 11/25 x1  Dose adjustments this admission: N/A  Microbiology results: 11/25 BCx: pending  Thank you for involving pharmacy in this patient's care.   Rockwell Alexandria, PharmD Clinical Pharmacist 06/15/2023 2:03 PM

## 2023-06-15 NOTE — Transfer of Care (Signed)
Immediate Anesthesia Transfer of Care Note  Patient: Michaela Johnson  Procedure(s) Performed: INCISION AND DRAINAGE NASAL ABSCESS  Patient Location: PACU  Anesthesia Type:General  Level of Consciousness: awake, alert , and oriented  Airway & Oxygen Therapy: Patient Spontanous Breathing and Patient connected to face mask oxygen  Post-op Assessment: Report given to RN and Post -op Vital signs reviewed and stable  Post vital signs: Reviewed and stable  Last Vitals:  Vitals Value Taken Time  BP 144/78 06/15/23 1725  Temp    Pulse    Resp 22 06/15/23 1729  SpO2    Vitals shown include unfiled device data.  Last Pain:  Vitals:   06/15/23 1410  TempSrc:   PainSc: 10-Worst pain ever         Complications: No notable events documented.

## 2023-06-15 NOTE — Assessment & Plan Note (Deleted)
Prenasal soft tissue abscess and cellulitis approximately 3.3 x 1.6 x 2.1 cm Continue with cefepime and vancomycin per pharmacy Patient understands that there is an infection with purulence and that we will need to drain the purulence out Patient is agreeable to surgical intervention further removal of the purulence/infection ENT has been consulted for I&D Tentatively scheduled for surgery at 4 PM on day of admission N.p.o. in anticipation of OR Admit to telemetry medical, inpatient

## 2023-06-16 ENCOUNTER — Encounter: Payer: Self-pay | Admitting: Unknown Physician Specialty

## 2023-06-16 DIAGNOSIS — E876 Hypokalemia: Secondary | ICD-10-CM | POA: Insufficient documentation

## 2023-06-16 DIAGNOSIS — L97529 Non-pressure chronic ulcer of other part of left foot with unspecified severity: Secondary | ICD-10-CM

## 2023-06-16 DIAGNOSIS — B850 Pediculosis due to Pediculus humanus capitis: Secondary | ICD-10-CM

## 2023-06-16 DIAGNOSIS — G629 Polyneuropathy, unspecified: Secondary | ICD-10-CM

## 2023-06-16 DIAGNOSIS — E1159 Type 2 diabetes mellitus with other circulatory complications: Secondary | ICD-10-CM

## 2023-06-16 DIAGNOSIS — E871 Hypo-osmolality and hyponatremia: Secondary | ICD-10-CM | POA: Insufficient documentation

## 2023-06-16 DIAGNOSIS — J340 Abscess, furuncle and carbuncle of nose: Secondary | ICD-10-CM | POA: Diagnosis not present

## 2023-06-16 DIAGNOSIS — Z794 Long term (current) use of insulin: Secondary | ICD-10-CM

## 2023-06-16 DIAGNOSIS — L97509 Non-pressure chronic ulcer of other part of unspecified foot with unspecified severity: Secondary | ICD-10-CM

## 2023-06-16 DIAGNOSIS — E1165 Type 2 diabetes mellitus with hyperglycemia: Secondary | ICD-10-CM | POA: Diagnosis not present

## 2023-06-16 DIAGNOSIS — D72829 Elevated white blood cell count, unspecified: Secondary | ICD-10-CM

## 2023-06-16 DIAGNOSIS — F17200 Nicotine dependence, unspecified, uncomplicated: Secondary | ICD-10-CM

## 2023-06-16 DIAGNOSIS — I480 Paroxysmal atrial fibrillation: Secondary | ICD-10-CM | POA: Diagnosis not present

## 2023-06-16 DIAGNOSIS — I152 Hypertension secondary to endocrine disorders: Secondary | ICD-10-CM

## 2023-06-16 LAB — GLUCOSE, CAPILLARY
Glucose-Capillary: 60 mg/dL — ABNORMAL LOW (ref 70–99)
Glucose-Capillary: 112 mg/dL — ABNORMAL HIGH (ref 70–99)
Glucose-Capillary: 221 mg/dL — ABNORMAL HIGH (ref 70–99)
Glucose-Capillary: 299 mg/dL — ABNORMAL HIGH (ref 70–99)
Glucose-Capillary: 346 mg/dL — ABNORMAL HIGH (ref 70–99)

## 2023-06-16 LAB — CBC
HCT: 38.5 % (ref 36.0–46.0)
Hemoglobin: 12.9 g/dL (ref 12.0–15.0)
MCH: 31 pg (ref 26.0–34.0)
MCHC: 33.5 g/dL (ref 30.0–36.0)
MCV: 92.5 fL (ref 80.0–100.0)
Platelets: 310 10*3/uL (ref 150–400)
RBC: 4.16 MIL/uL (ref 3.87–5.11)
RDW: 13.5 % (ref 11.5–15.5)
WBC: 13.1 10*3/uL — ABNORMAL HIGH (ref 4.0–10.5)
nRBC: 0 % (ref 0.0–0.2)

## 2023-06-16 LAB — BASIC METABOLIC PANEL
Anion gap: 9 (ref 5–15)
BUN: 11 mg/dL (ref 8–23)
CO2: 27 mmol/L (ref 22–32)
Calcium: 8.3 mg/dL — ABNORMAL LOW (ref 8.9–10.3)
Chloride: 100 mmol/L (ref 98–111)
Creatinine, Ser: 0.74 mg/dL (ref 0.44–1.00)
GFR, Estimated: >60 mL/min (ref >60)
Glucose, Bld: 93 mg/dL (ref 70–99)
Potassium: 4 mmol/L (ref 3.5–5.1)
Sodium: 136 mmol/L (ref 135–145)

## 2023-06-16 MED ORDER — INSULIN GLARGINE-YFGN 100 UNIT/ML ~~LOC~~ SOLN
15.0000 [IU] | Freq: Every day | SUBCUTANEOUS | Status: DC
  Administered 2023-06-16: 15 [IU] via SUBCUTANEOUS
  Filled 2023-06-16: qty 0.15

## 2023-06-16 MED ORDER — SALINE SPRAY 0.65 % NA SOLN
6.0000 | Freq: Three times a day (TID) | NASAL | Status: DC
  Administered 2023-06-16 – 2023-06-17 (×3): 6 via NASAL
  Filled 2023-06-16: qty 44

## 2023-06-16 MED ORDER — EMPAGLIFLOZIN 25 MG PO TABS
25.0000 mg | ORAL_TABLET | Freq: Every day | ORAL | Status: DC
  Administered 2023-06-16 – 2023-06-17 (×2): 25 mg via ORAL
  Filled 2023-06-16 (×3): qty 1

## 2023-06-16 MED ORDER — DEXAMETHASONE SODIUM PHOSPHATE 10 MG/ML IJ SOLN
10.0000 mg | Freq: Three times a day (TID) | INTRAMUSCULAR | Status: DC
  Administered 2023-06-16 – 2023-06-17 (×4): 10 mg via INTRAVENOUS
  Filled 2023-06-16 (×4): qty 1

## 2023-06-16 MED ORDER — FLUOXETINE HCL 20 MG PO CAPS
40.0000 mg | ORAL_CAPSULE | Freq: Every day | ORAL | Status: DC
  Administered 2023-06-16 – 2023-06-17 (×2): 40 mg via ORAL
  Filled 2023-06-16 (×3): qty 2

## 2023-06-16 MED ORDER — DEXTROSE 50 % IV SOLN: 25.0000 g | Freq: Once | INTRAVENOUS | Status: DC

## 2023-06-16 MED ORDER — GENTAMICIN SULFATE 0.1 % EX CREA
TOPICAL_CREAM | Freq: Three times a day (TID) | CUTANEOUS | Status: DC
  Filled 2023-06-16: qty 15

## 2023-06-16 MED ORDER — PANTOPRAZOLE SODIUM 40 MG PO TBEC
40.0000 mg | DELAYED_RELEASE_TABLET | Freq: Every day | ORAL | Status: DC
  Administered 2023-06-16 – 2023-06-17 (×2): 40 mg via ORAL
  Filled 2023-06-16 (×2): qty 1

## 2023-06-16 MED ORDER — OXYCODONE HCL 5 MG PO TABS
5.0000 mg | ORAL_TABLET | Freq: Four times a day (QID) | ORAL | Status: DC | PRN
  Administered 2023-06-16 – 2023-06-17 (×2): 5 mg via ORAL
  Filled 2023-06-16 (×2): qty 1

## 2023-06-16 MED ORDER — METOPROLOL SUCCINATE ER 25 MG PO TB24
25.0000 mg | ORAL_TABLET | Freq: Every day | ORAL | Status: DC
  Administered 2023-06-16 – 2023-06-17 (×2): 25 mg via ORAL
  Filled 2023-06-16 (×2): qty 1

## 2023-06-16 MED ORDER — GABAPENTIN 300 MG PO CAPS
900.0000 mg | ORAL_CAPSULE | Freq: Every day | ORAL | Status: DC
  Administered 2023-06-16: 900 mg via ORAL
  Filled 2023-06-16: qty 3

## 2023-06-16 NOTE — Assessment & Plan Note (Signed)
On Toprol

## 2023-06-16 NOTE — Assessment & Plan Note (Signed)
Sugars will likely be higher while on steroids continue to monitor.  Patient placed on sliding scale.  Will place on long-acting insulin at night.  Patient on Jardiance.

## 2023-06-16 NOTE — Progress Notes (Signed)
Progress Note   Patient: Michaela Johnson ZOX:[id number] DOB: 02/01/1961 DOA: 06/15/2023     1 DOS: the patient was seen and examined on 06/16/2023   Brief hospital course: Ms. Michaela Johnson is a 62 year old female with history of hypertension, non-insulin-dependent diabetes mellitus, GERD, history of head lice, who presents emergency department for chief concerns of nose infection.  Patient reports this has been ongoing for about 1 week.  Dr. Jenne Campus ENT took to the operating room on 11/25 for incision and drainage of nasal abscess  11/26.  Decadron added.  Continue IV vancomycin.  Gram-positive cocci in cultures from operative report.    Assessment and Plan: * Abscess of nasal cavity Patient went to the operating room with Dr. Jenne Campus yesterday for abscess drainage.  Continue IV vancomycin.  Continue Decadron.  Evaluate on a daily basis.  Of note patient does have a little area on her left ear and top of her head that are likely Staph aureus also.  Uncontrolled type 2 diabetes mellitus with hyperglycemia, with long-term current use of insulin (HCC) Sugars will likely be higher while on steroids continue to monitor.  Patient placed on sliding scale.  Will place on long-acting insulin at night.  Patient on Jardiance.  PAF (paroxysmal atrial fibrillation) (HCC) Home Xarelto held.  Can likely resume tomorrow.  Resume Toprol-XL.  Hypertension associated with diabetes (HCC) On Toprol  Leukocytosis Secondary to abscess.  Will likely stay a little elevated with steroids.  Toe ulcer (HCC) Seems more pressure between first and second toe on left foot.  Will get wound care consultation.  Neuropathy Gabapentin 900 mg at night  Head lice Treated prior to coming in.  Smoker Nicotine patch  Hypokalemia Potassium normal range today  Hyponatremia Sodium normal range today        Subjective: Patient feels little bit better than yesterday.  Still having pain in her nose.  Nose still  red and swollen.  Also has an area on the top of her head and left ear.  Also noticed area between her toes on the left foot.  Physical Exam: Vitals:   06/15/23 1846 06/15/23 1956 06/16/23 0442 06/16/23 0718  BP: (!) 157/84 112/62 134/70 (!) 151/78  Pulse: (!) 119 (!) 117 (!) 109 (!) 107  Resp: 18 18 18    Temp: 99.1 F (37.3 C) 100 F (37.8 C) 99.4 F (37.4 C) 98.8 F (37.1 C)  TempSrc: Oral Oral Oral Oral  SpO2: 99% 100% 91% 95%  Weight:      Height:       Physical Exam HENT:     Head: Normocephalic.     Mouth/Throat:     Pharynx: No oropharyngeal exudate.  Eyes:     General: Lids are normal.     Conjunctiva/sclera: Conjunctivae normal.  Cardiovascular:     Rate and Rhythm: Normal rate and regular rhythm.     Heart sounds: Normal heart sounds, S1 normal and S2 normal.  Pulmonary:     Breath sounds: No decreased breath sounds, wheezing, rhonchi or rales.  Abdominal:     Palpations: Abdomen is soft.     Tenderness: There is no abdominal tenderness.  Musculoskeletal:     Right lower leg: No swelling.     Left lower leg: No swelling.  Skin:    General: Skin is warm.     Comments: Erythema and swelling of the nose.  Small area of a little spot on the top of her head and left earlobe.  Looks like  small ulceration on first and second toes with pressure.  Neurological:     Mental Status: She is alert and oriented to person, place, and time.     Data Reviewed: Creatinine 0.74, potassium 4.0, sodium 136  Family Communication: Friend at the bedside  Disposition: Status is: Inpatient Remains inpatient appropriate because: Continue IV antibiotics and IV Decadron.  Planned Discharge Destination: Home    Time spent: 28 minutes  Author: Alford Highland, MD 06/16/2023 3:22 PM  For on call review [email or web address].

## 2023-06-16 NOTE — Assessment & Plan Note (Signed)
Potassium normal range today

## 2023-06-16 NOTE — Assessment & Plan Note (Addendum)
Patient went to the operating room with Dr. Jenne Campus yesterday for abscess drainage.  Continue IV vancomycin.  Continue Decadron.  Evaluate on a daily basis.  Of note patient does have a little area on her left ear and top of her head that are likely Staph aureus also.

## 2023-06-16 NOTE — Progress Notes (Signed)
06/16/2023 4:24 PM  Michaela Johnson [id number]  Post-Op Day 1    Temp:  [98.7 F (37.1 C)-100 F (37.8 C)] 99.1 F (37.3 C) (11/26 1612) Pulse Rate:  [107-120] 110 (11/26 1612) Resp:  [14-19] 18 (11/26 1612) BP: (112-158)/(62-87) 148/84 (11/26 1612) SpO2:  [87 %-100 %] 97 % (11/26 1612),     Intake/Output Summary (Last 24 hours) at 06/16/2023 1624 Last data filed at 06/16/2023 1500 Gross per 24 hour  Intake 109.28 ml  Output --  Net 109.28 ml    Results for orders placed or performed during the hospital encounter of 06/15/23 (from the past 24 hour(s))  Aerobic/Anaerobic Culture w Gram Stain (surgical/deep wound)     Status: None (Preliminary result)   Collection Time: 06/15/23  4:50 PM   Specimen: Nose; ENT  Result Value Ref Range   Specimen Description WOUND    Special Requests NASAL ABSC SPEC A    Gram Stain      RARE WBC PRESENT, PREDOMINANTLY MONONUCLEAR FEW GRAM POSITIVE COCCI IN CLUSTERS    Culture      TOO YOUNG TO READ Performed at [facility], [address], [place], [place] [address]    Report Status PENDING   CBG monitoring, ED     Status: None   Collection Time: 06/15/23  5:31 PM  Result Value Ref Range   Glucose-Capillary 91 70 - 99 mg/dL  Glucose, capillary     Status: Abnormal   Collection Time: 06/15/23  9:46 PM  Result Value Ref Range   Glucose-Capillary 197 (H) 70 - 99 mg/dL  Basic metabolic panel     Status: Abnormal   Collection Time: 06/16/23  5:25 AM  Result Value Ref Range   Sodium 136 135 - 145 mmol/L   Potassium 4.0 3.5 - 5.1 mmol/L   Chloride 100 98 - 111 mmol/L   CO2 27 22 - 32 mmol/L   Glucose, Bld 93 70 - 99 mg/dL   BUN 11 8 - 23 mg/dL   Creatinine, Ser 3.32 0.44 - 1.00 mg/dL   Calcium 8.3 (L) 8.9 - 10.3 mg/dL   GFR, Estimated >95 >18 mL/min   Anion gap 9 5 - 15  CBC     Status: Abnormal   Collection Time: 06/16/23  5:25 AM  Result Value Ref Range   WBC 13.1 (H) 4.0 - 10.5 K/uL   RBC 4.16 3.87 - 5.11 MIL/uL    Hemoglobin 12.9 12.0 - 15.0 g/dL   HCT 84.1 66.0 - 63.0 %   MCV 92.5 80.0 - 100.0 fL   MCH 31.0 26.0 - 34.0 pg   MCHC 33.5 30.0 - 36.0 g/dL   RDW 16.0 10.9 - 32.3 %   Platelets 310 150 - 400 K/uL   nRBC 0.0 0.0 - 0.2 %  Glucose, capillary     Status: Abnormal   Collection Time: 06/16/23  7:17 AM  Result Value Ref Range   Glucose-Capillary 60 (L) 70 - 99 mg/dL   Comment 1 Notify RN    Comment 2 Document in Chart   Glucose, capillary     Status: Abnormal   Collection Time: 06/16/23  8:20 AM  Result Value Ref Range   Glucose-Capillary 112 (H) 70 - 99 mg/dL   Comment 1 Notify RN    Comment 2 Document in Chart   Glucose, capillary     Status: Abnormal   Collection Time: 06/16/23 11:26 AM  Result Value Ref Range   Glucose-Capillary 221 (H) 70 -  99 mg/dL   Comment 1 Notify RN    Comment 2 Document in Chart   Glucose, capillary     Status: Abnormal   Collection Time: 06/16/23  4:09 PM  Result Value Ref Range   Glucose-Capillary 299 (H) 70 - 99 mg/dL    SUBJECTIVE: Patient feeling better this afternoon.  OBJECTIVE: I removed the intranasal iodoform gauze packing.  Swelling has improved.  IMPRESSION: Status post incision and drainage of nasal abscess.  PLAN: Cultures are continuing to pend, would recommend keeping her in the hospital IV antibiotics and steroids until those results results come back.  ENT will sign off for now if her symptoms worsen would repeat CT to be sure fluid collection has not returned.  I will let my partners on call no about her condition.  She can follow-up with me as an outpatient 2 to 3 weeks following discharge.  [name] 06/16/2023, 4:24 PM

## 2023-06-16 NOTE — Progress Notes (Signed)
06/16/2023 8:00 AM  [name] [id number]  Post-Op Day 1    Temp:  [98 F (36.7 C)-100 F (37.8 C)] 98.8 F (37.1 C) (11/26 0718) Pulse Rate:  [105-122] 107 (11/26 0718) Resp:  [14-23] 18 (11/26 0442) BP: (112-160)/(62-101) 151/78 (11/26 0718) SpO2:  [87 %-100 %] 95 % (11/26 0718) Weight:  [51 kg] 51 kg (11/25 0933),     Intake/Output Summary (Last 24 hours) at 06/16/2023 0800 Last data filed at 06/16/2023 0405 Gross per 24 hour  Intake 1100 ml  Output --  Net 1100 ml    Results for orders placed or performed during the hospital encounter of 06/15/23 (from the past 24 hour(s))  Urinalysis, Routine w reflex microscopic -Urine, Clean Catch     Status: Abnormal   Collection Time: 06/15/23  8:55 AM  Result Value Ref Range   Color, Urine YELLOW (A) YELLOW   APPearance HAZY (A) CLEAR   Specific Gravity, Urine 1.032 (H) 1.005 - 1.030   pH 7.0 5.0 - 8.0   Glucose, UA >=500 (A) NEGATIVE mg/dL   Hgb urine dipstick NEGATIVE NEGATIVE   Bilirubin Urine NEGATIVE NEGATIVE   Ketones, ur 20 (A) NEGATIVE mg/dL   Protein, ur 956 (A) NEGATIVE mg/dL   Nitrite NEGATIVE NEGATIVE   Leukocytes,Ua MODERATE (A) NEGATIVE   RBC / HPF 6-10 0 - 5 RBC/hpf   WBC, UA 11-20 0 - 5 WBC/hpf   Bacteria, UA RARE (A) NONE SEEN   Squamous Epithelial / HPF 0-5 0 - 5 /HPF  Comprehensive metabolic panel     Status: Abnormal   Collection Time: 06/15/23 10:44 AM  Result Value Ref Range   Sodium 131 (L) 135 - 145 mmol/L   Potassium 3.3 (L) 3.5 - 5.1 mmol/L   Chloride 93 (L) 98 - 111 mmol/L   CO2 26 22 - 32 mmol/L   Glucose, Bld 154 (H) 70 - 99 mg/dL   BUN 15 8 - 23 mg/dL   Creatinine, Ser 2.13 0.44 - 1.00 mg/dL   Calcium 8.7 (L) 8.9 - 10.3 mg/dL   Total Protein 7.9 6.5 - 8.1 g/dL   Albumin 2.9 (L) 3.5 - 5.0 g/dL   AST 11 (L) 15 - 41 U/L   ALT 9 0 - 44 U/L   Alkaline Phosphatase 82 38 - 126 U/L   Total Bilirubin 0.4 <1.2 mg/dL   GFR, Estimated >08 >65 mL/min   Anion gap 12 5 - 15  CBC with  Differential     Status: Abnormal   Collection Time: 06/15/23 10:44 AM  Result Value Ref Range   WBC 14.7 (H) 4.0 - 10.5 K/uL   RBC 4.32 3.87 - 5.11 MIL/uL   Hemoglobin 13.6 12.0 - 15.0 g/dL   HCT 78.4 69.6 - 29.5 %   MCV 93.3 80.0 - 100.0 fL   MCH 31.5 26.0 - 34.0 pg   MCHC 33.7 30.0 - 36.0 g/dL   RDW 28.4 13.2 - 44.0 %   Platelets 336 150 - 400 K/uL   nRBC 0.0 0.0 - 0.2 %   Neutrophils Relative % 80 %   Neutro Abs 12.0 (H) 1.7 - 7.7 K/uL   Lymphocytes Relative 11 %   Lymphs Abs 1.6 0.7 - 4.0 K/uL   Monocytes Relative 7 %   Monocytes Absolute 1.0 0.1 - 1.0 K/uL   Eosinophils Relative 0 %   Eosinophils Absolute 0.1 0.0 - 0.5 K/uL   Basophils Relative 1 %   Basophils Absolute 0.1 0.0 - 0.1  K/uL   Immature Granulocytes 1 %   Abs Immature Granulocytes 0.07 0.00 - 0.07 K/uL  Lactic acid, plasma     Status: None   Collection Time: 06/15/23 10:44 AM  Result Value Ref Range   Lactic Acid, Venous 1.3 0.5 - 1.9 mmol/L  Lactic acid, plasma     Status: None   Collection Time: 06/15/23 11:42 AM  Result Value Ref Range   Lactic Acid, Venous 1.5 0.5 - 1.9 mmol/L  Aerobic/Anaerobic Culture w Gram Stain (surgical/deep wound)     Status: None (Preliminary result)   Collection Time: 06/15/23  4:50 PM   Specimen: Nose; ENT  Result Value Ref Range   Specimen Description WOUND    Special Requests NASAL ABSC SPEC A    Gram Stain      RARE WBC PRESENT, PREDOMINANTLY MONONUCLEAR FEW GRAM POSITIVE COCCI IN CLUSTERS Performed at [facility], [address] [place], [place] [address]    Culture PENDING    Report Status PENDING   CBG monitoring, ED     Status: None   Collection Time: 06/15/23  5:31 PM  Result Value Ref Range   Glucose-Capillary 91 70 - 99 mg/dL  Glucose, capillary     Status: Abnormal   Collection Time: 06/15/23  9:46 PM  Result Value Ref Range   Glucose-Capillary 197 (H) 70 - 99 mg/dL  Basic metabolic panel     Status: Abnormal   Collection Time: 06/16/23  5:25 AM   Result Value Ref Range   Sodium 136 135 - 145 mmol/L   Potassium 4.0 3.5 - 5.1 mmol/L   Chloride 100 98 - 111 mmol/L   CO2 27 22 - 32 mmol/L   Glucose, Bld 93 70 - 99 mg/dL   BUN 11 8 - 23 mg/dL   Creatinine, Ser 9.56 0.44 - 1.00 mg/dL   Calcium 8.3 (L) 8.9 - 10.3 mg/dL   GFR, Estimated >21 >30 mL/min   Anion gap 9 5 - 15  CBC     Status: Abnormal   Collection Time: 06/16/23  5:25 AM  Result Value Ref Range   WBC 13.1 (H) 4.0 - 10.5 K/uL   RBC 4.16 3.87 - 5.11 MIL/uL   Hemoglobin 12.9 12.0 - 15.0 g/dL   HCT 86.5 78.4 - 69.6 %   MCV 92.5 80.0 - 100.0 fL   MCH 31.0 26.0 - 34.0 pg   MCHC 33.5 30.0 - 36.0 g/dL   RDW 29.5 28.4 - 13.2 %   Platelets 310 150 - 400 K/uL   nRBC 0.0 0.0 - 0.2 %  Glucose, capillary     Status: Abnormal   Collection Time: 06/16/23  7:17 AM  Result Value Ref Range   Glucose-Capillary 60 (L) 70 - 99 mg/dL   Comment 1 Notify RN    Comment 2 Document in Chart     SUBJECTIVE: Patient feeling better this morning.  Headache significantly improved.  OBJECTIVE: Iodoform gauze still in position.  Swelling of the nose has decreased.,  She does have some crusting of the left earlobe consistent with cellulitis.  IMPRESSION: Status post I&D of nasal abscess, Gram stain has gram-positive cocci consistent with staph.  Will continue with vancomycin.  Will add gentamicin ointment to the ear and nose.  PLAN: I will return this afternoon to remove the iodoform gauze from the nose.  Will continue current treatment would add Decadron 10 mg IV every 8 hours for the inflammatory process.  Michaela Johnson 06/16/2023,  8:00 AM

## 2023-06-16 NOTE — Assessment & Plan Note (Signed)
Sodium normal range today

## 2023-06-16 NOTE — Assessment & Plan Note (Signed)
Seems more pressure between first and second toe on left foot.  Will get wound care consultation.

## 2023-06-17 ENCOUNTER — Other Ambulatory Visit (HOSPITAL_COMMUNITY): Payer: Self-pay

## 2023-06-17 DIAGNOSIS — E1165 Type 2 diabetes mellitus with hyperglycemia: Secondary | ICD-10-CM | POA: Diagnosis not present

## 2023-06-17 DIAGNOSIS — J340 Abscess, furuncle and carbuncle of nose: Secondary | ICD-10-CM | POA: Diagnosis not present

## 2023-06-17 DIAGNOSIS — I480 Paroxysmal atrial fibrillation: Secondary | ICD-10-CM | POA: Diagnosis not present

## 2023-06-17 DIAGNOSIS — Z794 Long term (current) use of insulin: Secondary | ICD-10-CM | POA: Diagnosis not present

## 2023-06-17 LAB — BASIC METABOLIC PANEL
Anion gap: 10 (ref 5–15)
BUN: 31 mg/dL — ABNORMAL HIGH (ref 8–23)
CO2: 25 mmol/L (ref 22–32)
Calcium: 8.8 mg/dL — ABNORMAL LOW (ref 8.9–10.3)
Chloride: 97 mmol/L — ABNORMAL LOW (ref 98–111)
Creatinine, Ser: 0.72 mg/dL (ref 0.44–1.00)
GFR, Estimated: >60 mL/min (ref >60)
Glucose, Bld: 251 mg/dL — ABNORMAL HIGH (ref 70–99)
Potassium: 4.5 mmol/L (ref 3.5–5.1)
Sodium: 132 mmol/L — ABNORMAL LOW (ref 135–145)

## 2023-06-17 LAB — CBC
HCT: 36.4 % (ref 36.0–46.0)
Hemoglobin: 12.4 g/dL (ref 12.0–15.0)
MCH: 30.7 pg (ref 26.0–34.0)
MCHC: 34.1 g/dL (ref 30.0–36.0)
MCV: 90.1 fL (ref 80.0–100.0)
Platelets: 360 10*3/uL (ref 150–400)
RBC: 4.04 MIL/uL (ref 3.87–5.11)
RDW: 13.1 % (ref 11.5–15.5)
WBC: 10.3 10*3/uL (ref 4.0–10.5)
nRBC: 0 % (ref 0.0–0.2)

## 2023-06-17 LAB — GLUCOSE, CAPILLARY
Glucose-Capillary: 436 mg/dL — ABNORMAL HIGH (ref 70–99)
Glucose-Capillary: 453 mg/dL — ABNORMAL HIGH (ref 70–99)
Glucose-Capillary: 362 mg/dL — ABNORMAL HIGH (ref 70–99)

## 2023-06-17 MED ORDER — INSULIN ASPART 100 UNIT/ML IJ SOLN
10.0000 [IU] | Freq: Three times a day (TID) | INTRAMUSCULAR | Status: DC
  Administered 2023-06-17 (×2): 10 [IU] via SUBCUTANEOUS
  Filled 2023-06-17 (×2): qty 1

## 2023-06-17 MED ORDER — RIVAROXABAN 20 MG PO TABS
20.0000 mg | ORAL_TABLET | Freq: Every day | ORAL | Status: DC
  Filled 2023-06-17: qty 1

## 2023-06-17 MED ORDER — LANTUS SOLOSTAR 100 UNIT/ML ~~LOC~~ SOPN: 20.0000 [IU] | PEN_INJECTOR | Freq: Every day | SUBCUTANEOUS | 0 refills | Status: DC

## 2023-06-17 MED ORDER — INSULIN ASPART 100 UNIT/ML IJ SOLN: 10.0000 [IU] | Freq: Three times a day (TID) | INTRAMUSCULAR | Status: DC

## 2023-06-17 MED ORDER — NICOTINE 21 MG/24HR TD PT24: 21.0000 mg | MEDICATED_PATCH | Freq: Every day | TRANSDERMAL | 0 refills | Status: DC | PRN

## 2023-06-17 MED ORDER — NOVOLOG FLEXPEN 100 UNIT/ML ~~LOC~~ SOPN: 15.0000 [IU] | PEN_INJECTOR | Freq: Three times a day (TID) | SUBCUTANEOUS | 11 refills | Status: DC

## 2023-06-17 MED ORDER — INSULIN GLARGINE-YFGN 100 UNIT/ML ~~LOC~~ SOLN
50.0000 [IU] | Freq: Every day | SUBCUTANEOUS | Status: DC
  Filled 2023-06-17: qty 0.5

## 2023-06-17 MED ORDER — LINEZOLID 600 MG PO TABS: 600.0000 mg | ORAL_TABLET | Freq: Two times a day (BID) | ORAL | 0 refills | Status: AC

## 2023-06-17 NOTE — Progress Notes (Signed)
  Progress Note   Patient: Michaela Johnson [id number]:[id number] DOB: [birth year] DOA: 06/15/2023     2 DOS: the patient was seen and examined on 06/17/2023   Brief hospital course: Michaela Johnson is a 62 year old female with history of hypertension, non-insulin-dependent diabetes mellitus, GERD, history of head lice, who presents emergency department for chief concerns of nose infection.  Patient reports this has been ongoing for about 1 week.  Dr. Jenne Campus ENT took to the operating room on 11/25 for incision and drainage of nasal abscess Culture from abscess showed Gram positive cocci in clusters.  Patient currently on vancomycin.    Principal Problem:   Abscess of nasal cavity Active Problems:   Uncontrolled type 2 diabetes mellitus with hyperglycemia, with long-term current use of insulin (HCC)   PAF (paroxysmal atrial fibrillation) (HCC)   Hypertension associated with diabetes (HCC)   Leukocytosis   Nicotine dependence   Toe ulcer (HCC)   Neuropathy   Head lice   Smoker   Hyponatremia   Hypokalemia   Assessment and Plan: * Abscess of nasal cavity Status post I&D, culture came back with gram-positive cocci in clusters, discussed with microbiology, final results may not be available until Friday. Continue vancomycin, nasal swelling is better, discontinue steroids due to worsening glucose control.  Uncontrolled type 2 diabetes mellitus with hyperglycemia, with long-term current use of insulin (HCC) Patient had a significant elevation of glucose, increase long-acting insulin, scheduled NovoLog and sliding scale insulin.  PAF (paroxysmal atrial fibrillation) (HCC) Continue Xarelto and beta-blocker.  Hypertension associated with diabetes (HCC) On Toprol  Toe ulcer (HCC) Seems more pressure between first and second toe on left foot.  Followed with wound care, will refer to podiatry after discharge.  Neuropathy Gabapentin 900 mg at night  Head lice Treated prior to coming  in.  Smoker Nicotine patch  Hypokalemia Hyponatremia Condition is stable.       Subjective:  Patient nasal swelling much better, no fever or chills.  Physical Exam: Vitals:   06/16/23 1612 06/16/23 2122 06/17/23 0507 06/17/23 0838  BP: (!) 148/84 (!) 162/77 138/73 (!) 167/87  Pulse: (!) 110 93 80 100  Resp: 18 18 18 16   Temp: 99.1 F (37.3 C) 98.8 F (37.1 C) 98.9 F (37.2 C) 97.9 F (36.6 C)  TempSrc: Oral Oral Oral Oral  SpO2: 97% 99% 97% 99%  Weight:      Height:       General exam: Appears calm and comfortable  Respiratory system: Clear to auscultation. Respiratory effort normal. Cardiovascular system: Irregular. No JVD, murmurs, rubs, gallops or clicks. No pedal edema. Gastrointestinal system: Abdomen is nondistended, soft and nontender. No organomegaly or masses felt. Normal bowel sounds heard. Central nervous system: Alert and oriented. No focal neurological deficits. Extremities: Symmetric 5 x 5 power. Skin: No rashes, lesions or ulcers Psychiatry: Judgement and insight appear normal. Mood & affect appropriate.    Data Reviewed:  Lab results reviewed.  Family Communication: None  Disposition: Status is: Inpatient Remains inpatient appropriate because: Severity of disease, IV treatment.     Time spent: 35 minutes  Author: Marrion Coy, MD 06/17/2023 12:25 PM  For on call review [email or web address].

## 2023-06-17 NOTE — Progress Notes (Signed)
PT Cancellation Note  Patient Details Name: Michaela Johnson MRN: [id number] DOB: [birth year]   Cancelled Treatment:    Reason Eval/Treat Not Completed: Other (comment). Consult received and chart reviewed. Pt listed as level 6 and documented as indep ambulating in hallway. Secure chat sent to RN to identify any true PT needs. Will plan to sign off at this time. Please re-consult if needs change.   Ticia Virgo 06/17/2023, 8:59 AM Elizabeth Palau, PT, DPT, GCS [phone]

## 2023-06-17 NOTE — Discharge Summary (Addendum)
Physician Discharge Summary   Patient: Michaela Johnson MRN: [id number] DOB: 04/20/1961  Admit date:     06/15/2023  Discharge date: 06/17/23  Discharge Physician: Marrion Coy   PCP: Sandrea Hughs, NP   Recommendations at discharge:   Follow with PCP in 1 week. Follow-up with ENT in 1 to 2 weeks. Follow-up with podiatry in 1 to 2 weeks. Hold off Prozac due to interaction with Zyvox. Follow-up on the final culture results.  Discharge Diagnoses: Principal Problem:   Abscess of nasal cavity Active Problems:   Uncontrolled type 2 diabetes mellitus with hyperglycemia, with long-term current use of insulin (HCC)   PAF (paroxysmal atrial fibrillation) (HCC)   Hypertension associated with diabetes (HCC)   Leukocytosis   Nicotine dependence   Toe ulcer (HCC)   Neuropathy   Head lice   Smoker   Hyponatremia   Hypokalemia  Resolved Problems:   * No resolved hospital problems. [place] [facility] Course: Michaela Johnson is a 62 year old female with history of hypertension, non-insulin-dependent diabetes mellitus, GERD, history of head lice, who presents emergency department for chief concerns of nose infection.  Patient reports this has been ongoing for about 1 week.  Dr. Jenne Campus ENT took to the operating room on 11/25 for incision and drainage of nasal abscess Culture from abscess showed Gram positive cocci in clusters.  Patient currently on vancomycin. Patient condition has improved, spoke with microbiology, lab results will not be available until Friday.  At this point, I will treat with additional 5 days of Zyvox.  Assessment and Plan: * Abscess of nasal cavity Status post I&D, culture came back with gram-positive cocci in clusters, discussed with microbiology, final results may not be available until Friday. nasal swelling is better, discontinue steroids due to worsening glucose control. Patient wants to go home, I will give additional 5 days of Zyvox, patient need to follow-up with  PCP for culture results.   Uncontrolled type 2 diabetes mellitus with hyperglycemia, with long-term current use of insulin (HCC) Patient had a significant elevation of glucose, increase long-acting insulin, scheduled NovoLog and sliding scale insulin.   PAF (paroxysmal atrial fibrillation) (HCC) Continue Xarelto and beta-blocker.   Hypertension associated with diabetes (HCC) On Toprol   Toe ulcer (HCC) Seems more pressure between first and second toe on left foot.  Followed with wound care, will refer to podiatry after discharge.   Neuropathy Gabapentin 900 mg at night   Head lice Treated prior to coming in.   Smoker Nicotine patch   Hypokalemia Hyponatremia Condition is stable.    Left great toe wound. No evidence of infection.  Refer to podiatry.        Consultants: ENT. Procedures performed: I&D. Disposition: Home Diet recommendation:  Discharge Diet Orders (From admission, onward)     Start     Ordered   06/17/23 0000  Diet - low sodium heart healthy        06/17/23 1327           Carb modified diet DISCHARGE MEDICATION: Allergies as of 06/17/2023       Reactions   Glipizide    Other reaction(s): Other (See Comments)   Metformin Diarrhea   Codeine Itching   Levaquin [levofloxacin In D5w] Itching        Medication List     STOP taking these medications    FLUoxetine 40 MG capsule Commonly known as: PROZAC       TAKE these medications    gabapentin 300 MG capsule  Commonly known as: NEURONTIN Take 900 mg by mouth at bedtime.   Jardiance 25 MG Tabs tablet Generic drug: empagliflozin Take 25 mg by mouth daily.   Lantus SoloStar 100 UNIT/ML Solostar Pen Generic drug: insulin glargine Inject 20 Units into the skin daily. This is your home dose of long-acting insulin. What changed: how much to take   linezolid 600 MG tablet Commonly known as: Zyvox Take 1 tablet (600 mg total) by mouth 2 (two) times daily for 5 days.    metoprolol succinate 25 MG 24 hr tablet Commonly known as: TOPROL-XL Take 1 tablet (25 mg total) by mouth daily. Take with or immediately following a meal.   multivitamin with minerals Tabs tablet Take 1 tablet by mouth daily.   nicotine 21 mg/24hr patch Commonly known as: NICODERM CQ - dosed in mg/24 hours Place 1 patch (21 mg total) onto the skin daily as needed (nicotine craving).   NovoLOG FlexPen 100 UNIT/ML FlexPen Generic drug: insulin aspart Inject 15 Units into the skin 3 (three) times daily with meals. What changed:  how much to take when to take this   omeprazole 20 MG capsule Commonly known as: PRILOSEC Take 20 mg by mouth daily.   Ozempic (2 MG/DOSE) 8 MG/3ML Sopn Generic drug: Semaglutide (2 MG/DOSE) Inject 2 mg into the skin once a week. Sunday   rivaroxaban 20 MG Tabs tablet Commonly known as: XARELTO Take 1 tablet (20 mg total) by mouth daily with supper.   Victoza 18 MG/3ML Sopn Generic drug: liraglutide Inject 4 mg into the skin daily.   Vitamin D 125 MCG (5000 UT) Caps Take 1 capsule by mouth daily.               Discharge Care Instructions  (From admission, onward)           Start     Ordered   06/17/23 0000  Discharge wound care:       Comments: Wound care  Every other day    Comments: Clean L medial great toe wound with NS, apply a small piece of Xeroform gauze [place] [phone] to wound bed daily, cover with strip of silicone foam to separate toes and secure Xeroform gauze.  Perform wound care every other day.   06/17/23 1327            Follow-up Information     Sandrea Hughs, NP Follow up in 1 week(s).   Specialty: Nurse Practitioner Contact information: [address] [place] [address] [phone]         Linus Salmons, MD Follow up in 2 week(s).   Specialty: Otolaryngology Contact information: [address] [place] [address] [place] [address] [phone]         Felecia Shelling, DPM  Follow up in 2 week(s).   Specialty: Podiatry Contact information: [address] [place] [address] [place] [address] [phone]                Discharge Exam: Ceasar Mons Weights   06/15/23 0933  Weight: 51 kg   General exam: Appears calm and comfortable  Respiratory system: Clear to auscultation. Respiratory effort normal. Cardiovascular system: Irregular. No JVD, murmurs, rubs, gallops or clicks. No pedal edema. Gastrointestinal system: Abdomen is nondistended, soft and nontender. No organomegaly or masses felt. Normal bowel sounds heard. Central nervous system: Alert and oriented. No focal neurological deficits. Extremities: Symmetric 5 x 5 power. Skin: No rashes, lesions or ulcers Psychiatry: Judgement and insight appear normal.  Mood & affect appropriate.    Condition at discharge: good  The results of significant diagnostics from this hospitalization (including imaging, microbiology, ancillary and laboratory) are listed below for reference.   Imaging Studies: CT Maxillofacial W Contrast  Result Date: 06/15/2023 CLINICAL DATA:  Nasal abscess cellulitis EXAM: CT MAXILLOFACIAL WITH CONTRAST TECHNIQUE: Multidetector CT imaging of the maxillofacial structures was performed with intravenous contrast. Multiplanar CT image reconstructions were also generated. RADIATION DOSE REDUCTION: This exam was performed according to the departmental dose-optimization program which includes automated exposure control, adjustment of the mA and/or kV according to patient size and/or use of iterative reconstruction technique. CONTRAST:  75mL OMNIPAQUE IOHEXOL 300 MG/ML  SOLN COMPARISON:  None Available. FINDINGS: Osseous: No fracture or mandibular dislocation. No destructive process. Orbits: Negative. No traumatic or inflammatory finding. Sinuses: Mild mucosal thickening of the floor of the right maxillary sinus and in the left sphenoid sinus. Soft tissues: There is a 3.3 x 1.6 x 2.1 cm peripherally  enhancing fluid collection along the pre nasal soft tissues and the columella (series 3, image). Abscess also extends posteriorly to involve the nasal septum (series 3, image 55). Limited intracranial: No significant or unexpected finding. IMPRESSION: 3.3 x 1.6 x 2.1 cm soft tissue abscess in the pre nasal soft tissues extending inferiorly to the level of the columella and superiorly to the level of the nasal bridge. Abscess also extends posteriorly to involve the anterior nasal septum. Electronically Signed   By: Lorenza Cambridge M.D.   On: 06/15/2023 12:54    Microbiology: Results for orders placed or performed during the hospital encounter of 06/15/23  Culture, blood (routine x 2)     Status: None (Preliminary result)   Collection Time: 06/15/23 10:44 AM   Specimen: BLOOD  Result Value Ref Range Status   Specimen Description BLOOD LAC  Final   Special Requests   Final    BOTTLES DRAWN AEROBIC AND ANAEROBIC Blood Culture adequate volume   Culture   Final    NO GROWTH 2 DAYS Performed at [facility], [address], [place], [place] [address]    Report Status PENDING  Incomplete  Culture, blood (routine x 2)     Status: None (Preliminary result)   Collection Time: 06/15/23 11:50 AM   Specimen: BLOOD LEFT ARM  Result Value Ref Range Status   Specimen Description BLOOD LEFT ARM  Final   Special Requests Blood Culture adequate volume  Final   Culture   Final    NO GROWTH 2 DAYS Performed at [facility], [address], [place], [place] [address]    Report Status PENDING  Incomplete  Aerobic/Anaerobic Culture w Gram Stain (surgical/deep wound)     Status: None (Preliminary result)   Collection Time: 06/15/23  4:50 PM   Specimen: Nose; ENT  Result Value Ref Range Status   Specimen Description WOUND  Final   Special Requests NASAL ABSC SPEC A  Final   Gram Stain   Final    RARE WBC PRESENT, PREDOMINANTLY MONONUCLEAR FEW GRAM POSITIVE COCCI IN CLUSTERS Performed at  [facility], [address] [place], [place] [address]    Culture ABUNDANT STAPHYLOCOCCUS AUREUS  Final   Report Status PENDING  Incomplete    Labs: CBC: Recent Labs  Lab 06/15/23 1044 06/16/23 0525 06/17/23 0542  WBC 14.7* 13.1* 10.3  NEUTROABS 12.0*  --   --   HGB 13.6 12.9 12.4  HCT 40.3 38.5 36.4  MCV 93.3 92.5 90.1  PLT 336 310 360   Basic Metabolic Panel: Recent Labs  Lab 06/15/23 1044 06/16/23 0525 06/17/23 0542  NA 131* 136 132*  K 3.3* 4.0 4.5  CL 93* 100 97*  CO2 26 27 25   GLUCOSE 154* 93 251*  BUN 15 11 31*  CREATININE 0.64 0.74 0.72  CALCIUM 8.7* 8.3* 8.8*   Liver Function Tests: Recent Labs  Lab 06/15/23 1044  AST 11*  ALT 9  ALKPHOS 82  BILITOT 0.4  PROT 7.9  ALBUMIN 2.9*   CBG: Recent Labs  Lab 06/16/23 1609 06/16/23 2110 06/17/23 0830 06/17/23 0835 06/17/23 1202  GLUCAP 299* 346* 453* 436* 362*    Discharge time spent: greater than 30 minutes.  Signed: Marrion Coy, MD [facility] 06/17/2023

## 2023-06-17 NOTE — Consult Note (Signed)
WOC Nurse Consult Note: patient states she had a piece of skin that she pulled off on her L great toe; patient is a diabetic and states she has neuropathy  Reason for Consult: wound between L 1st and 2nd toe  Wound type: full thickness trauma per patient versus neuropathic Pressure Injury POA:  Measurement: 1 cm x 1 cm x 0.1 cm L medial great toe  Wound bed: 50% pink moist 50% dry brown  Drainage (amount, consistency, odor) minimal serosanguinous  Periwound: callus noted to tip of great toe and around nail bed  Dressing procedure/placement/frequency: Clean L medial great toe wound with NS, apply a small piece of Xeroform gauze [place] [phone]) to wound bed daily, cover with strip of silicone foam to separate toes and secure Xeroform gauze.  Perform wound care every other day.    POC discussed with patient and bedside nurse.  I did encourage patient to follow-up with podiatry as she is a diabetic and has callusing around this area.    WOC team will not follow. Re-consult if further needs arise.   Thank you,    Priscella Mann MSN, RN-BC, [employer] [phone]

## 2023-06-17 NOTE — Progress Notes (Signed)
Pharmacy Antibiotic Note  Michaela Johnson is a 62 y.o. female admitted on 06/15/2023 with cellulitis.  PMH significant for HTN, T2DM, and GERD. In ED, patient is afebrile with WBC wnl. Pharmacy has been consulted for vancomycin and cefepime dosing.  Plan: Antibiotics Day #3 Continue cefepime 2 grams IV every 12 hours Continue vancomycin 1 gram IV every 8 hours Estimated AUC 533, Cmin 11.9 IBW, Scr rounded to 0.8, Vd 0.72 Vancomycin levels at steady state or as clinically indicated Follow renal function and cultures for adjustments  Height: 5\' 2"  (157.5 cm) Weight: 51 kg (112 lb 7 oz) IBW/kg (Calculated) : 50.1  Temp (24hrs), Avg:98.9 F (37.2 C), Min:98.8 F (37.1 C), Max:99.1 F (37.3 C)  Recent Labs  Lab 06/15/23 1044 06/15/23 1142 06/16/23 0525 06/17/23 0542  WBC 14.7*  --  13.1* 10.3  CREATININE 0.64  --  0.74 0.72  LATICACIDVEN 1.3 1.5  --   --     Estimated Creatinine Clearance: 56.2 mL/min (by C-G formula based on SCr of 0.72 mg/dL).    Allergies  Allergen Reactions   Glipizide     Other reaction(s): Other (See Comments)   Metformin Diarrhea   Codeine Itching   Levaquin [Levofloxacin In D5w] Itching   Antimicrobials this admission: cefepime 11/25 >>  vancomycin 11/25 >>  Clindamycin 11/25 x1  Dose adjustments this admission: N/A  Microbiology results: 11/25 BCx: ngtd 11/25 Nasal abscess: GPC in clusters  Thank you for involving pharmacy in this patient's care.   Barrie Folk, PharmD Clinical Pharmacist 06/17/2023 7:46 AM

## 2023-06-17 NOTE — TOC Benefit Eligibility Note (Signed)
Pharmacy Patient Advocate Encounter  Insurance verification completed.    The patient is insured through [facility] ADVANTAGE/RX ADVANCE   Ran test claim for Linezolid. Currently a quantity of 10 is a 5 day supply and the co-pay is $20.05 .   This test claim was processed through [facility]- copay amounts may vary at other pharmacies due to pharmacy/plan contracts, or as the patient moves through the different stages of their insurance plan.

## 2023-06-20 LAB — AEROBIC/ANAEROBIC CULTURE W GRAM STAIN (SURGICAL/DEEP WOUND)

## 2023-06-20 LAB — CULTURE, BLOOD (ROUTINE X 2)
Culture: NO GROWTH
Culture: NO GROWTH
Special Requests: ADEQUATE
Special Requests: ADEQUATE

## 2023-08-15 ENCOUNTER — Inpatient Hospital Stay: Payer: Medicaid Other

## 2023-08-15 ENCOUNTER — Encounter: Payer: Self-pay | Admitting: Internal Medicine

## 2023-08-15 ENCOUNTER — Other Ambulatory Visit: Payer: Self-pay

## 2023-08-15 ENCOUNTER — Inpatient Hospital Stay
Admission: EM | Admit: 2023-08-15 | Discharge: 2023-08-19 | DRG: 638 | Disposition: A | Discharge status: Home / Self Care | Payer: Medicaid Other | Attending: Student | Admitting: Student

## 2023-08-15 DIAGNOSIS — Z794 Long term (current) use of insulin: Secondary | ICD-10-CM

## 2023-08-15 DIAGNOSIS — L97519 Non-pressure chronic ulcer of other part of right foot with unspecified severity: Secondary | ICD-10-CM | POA: Diagnosis present

## 2023-08-15 DIAGNOSIS — Z881 Allergy status to other antibiotic agents status: Secondary | ICD-10-CM

## 2023-08-15 DIAGNOSIS — L03031 Cellulitis of right toe: Secondary | ICD-10-CM | POA: Insufficient documentation

## 2023-08-15 DIAGNOSIS — F17290 Nicotine dependence, other tobacco product, uncomplicated: Secondary | ICD-10-CM | POA: Diagnosis present

## 2023-08-15 DIAGNOSIS — K7460 Unspecified cirrhosis of liver: Secondary | ICD-10-CM | POA: Diagnosis present

## 2023-08-15 DIAGNOSIS — Z8249 Family history of ischemic heart disease and other diseases of the circulatory system: Secondary | ICD-10-CM

## 2023-08-15 DIAGNOSIS — F17200 Nicotine dependence, unspecified, uncomplicated: Secondary | ICD-10-CM | POA: Diagnosis present

## 2023-08-15 DIAGNOSIS — B9562 Methicillin resistant Staphylococcus aureus infection as the cause of diseases classified elsewhere: Secondary | ICD-10-CM | POA: Diagnosis present

## 2023-08-15 DIAGNOSIS — E11621 Type 2 diabetes mellitus with foot ulcer: Secondary | ICD-10-CM | POA: Diagnosis present

## 2023-08-15 DIAGNOSIS — Z888 Allergy status to other drugs, medicaments and biological substances status: Secondary | ICD-10-CM

## 2023-08-15 DIAGNOSIS — L089 Local infection of the skin and subcutaneous tissue, unspecified: Secondary | ICD-10-CM | POA: Diagnosis not present

## 2023-08-15 DIAGNOSIS — E1010 Type 1 diabetes mellitus with ketoacidosis without coma: Secondary | ICD-10-CM | POA: Diagnosis not present

## 2023-08-15 DIAGNOSIS — Z1152 Encounter for screening for COVID-19: Secondary | ICD-10-CM

## 2023-08-15 DIAGNOSIS — E1110 Type 2 diabetes mellitus with ketoacidosis without coma: Secondary | ICD-10-CM | POA: Diagnosis present

## 2023-08-15 DIAGNOSIS — R7989 Other specified abnormal findings of blood chemistry: Secondary | ICD-10-CM

## 2023-08-15 DIAGNOSIS — D72829 Elevated white blood cell count, unspecified: Secondary | ICD-10-CM | POA: Diagnosis present

## 2023-08-15 DIAGNOSIS — I152 Hypertension secondary to endocrine disorders: Secondary | ICD-10-CM | POA: Diagnosis present

## 2023-08-15 DIAGNOSIS — L97509 Non-pressure chronic ulcer of other part of unspecified foot with unspecified severity: Secondary | ICD-10-CM | POA: Diagnosis present

## 2023-08-15 DIAGNOSIS — I480 Paroxysmal atrial fibrillation: Secondary | ICD-10-CM | POA: Diagnosis present

## 2023-08-15 DIAGNOSIS — I2489 Other forms of acute ischemic heart disease: Secondary | ICD-10-CM | POA: Diagnosis present

## 2023-08-15 DIAGNOSIS — R7881 Bacteremia: Secondary | ICD-10-CM | POA: Diagnosis not present

## 2023-08-15 DIAGNOSIS — E1140 Type 2 diabetes mellitus with diabetic neuropathy, unspecified: Secondary | ICD-10-CM | POA: Diagnosis present

## 2023-08-15 DIAGNOSIS — L02611 Cutaneous abscess of right foot: Secondary | ICD-10-CM | POA: Diagnosis not present

## 2023-08-15 DIAGNOSIS — M86171 Other acute osteomyelitis, right ankle and foot: Secondary | ICD-10-CM | POA: Diagnosis present

## 2023-08-15 DIAGNOSIS — Z79899 Other long term (current) drug therapy: Secondary | ICD-10-CM

## 2023-08-15 DIAGNOSIS — Z7984 Long term (current) use of oral hypoglycemic drugs: Secondary | ICD-10-CM

## 2023-08-15 DIAGNOSIS — Z5320 Procedure and treatment not carried out because of patient's decision for unspecified reasons: Secondary | ICD-10-CM | POA: Diagnosis present

## 2023-08-15 DIAGNOSIS — Z7901 Long term (current) use of anticoagulants: Secondary | ICD-10-CM | POA: Diagnosis not present

## 2023-08-15 DIAGNOSIS — E871 Hypo-osmolality and hyponatremia: Secondary | ICD-10-CM | POA: Diagnosis present

## 2023-08-15 DIAGNOSIS — E1169 Type 2 diabetes mellitus with other specified complication: Secondary | ICD-10-CM | POA: Diagnosis present

## 2023-08-15 DIAGNOSIS — Z833 Family history of diabetes mellitus: Secondary | ICD-10-CM

## 2023-08-15 DIAGNOSIS — Z716 Tobacco abuse counseling: Secondary | ICD-10-CM

## 2023-08-15 DIAGNOSIS — L03811 Cellulitis of head [any part, except face]: Secondary | ICD-10-CM

## 2023-08-15 DIAGNOSIS — F1090 Alcohol use, unspecified, uncomplicated: Secondary | ICD-10-CM | POA: Diagnosis present

## 2023-08-15 DIAGNOSIS — F17210 Nicotine dependence, cigarettes, uncomplicated: Secondary | ICD-10-CM | POA: Diagnosis present

## 2023-08-15 DIAGNOSIS — Z885 Allergy status to narcotic agent status: Secondary | ICD-10-CM

## 2023-08-15 DIAGNOSIS — E8729 Other acidosis: Secondary | ICD-10-CM | POA: Diagnosis not present

## 2023-08-15 DIAGNOSIS — E1159 Type 2 diabetes mellitus with other circulatory complications: Secondary | ICD-10-CM | POA: Diagnosis present

## 2023-08-15 DIAGNOSIS — L659 Nonscarring hair loss, unspecified: Secondary | ICD-10-CM | POA: Insufficient documentation

## 2023-08-15 DIAGNOSIS — E872 Acidosis, unspecified: Secondary | ICD-10-CM | POA: Diagnosis present

## 2023-08-15 DIAGNOSIS — F325 Major depressive disorder, single episode, in full remission: Secondary | ICD-10-CM | POA: Diagnosis present

## 2023-08-15 DIAGNOSIS — B850 Pediculosis due to Pediculus humanus capitis: Secondary | ICD-10-CM | POA: Diagnosis present

## 2023-08-15 DIAGNOSIS — Z7985 Long-term (current) use of injectable non-insulin antidiabetic drugs: Secondary | ICD-10-CM

## 2023-08-15 DIAGNOSIS — R112 Nausea with vomiting, unspecified: Secondary | ICD-10-CM

## 2023-08-15 DIAGNOSIS — E1165 Type 2 diabetes mellitus with hyperglycemia: Secondary | ICD-10-CM | POA: Diagnosis not present

## 2023-08-15 DIAGNOSIS — N179 Acute kidney failure, unspecified: Secondary | ICD-10-CM

## 2023-08-15 DIAGNOSIS — F1910 Other psychoactive substance abuse, uncomplicated: Secondary | ICD-10-CM | POA: Diagnosis present

## 2023-08-15 DIAGNOSIS — K219 Gastro-esophageal reflux disease without esophagitis: Secondary | ICD-10-CM | POA: Diagnosis present

## 2023-08-15 DIAGNOSIS — R569 Unspecified convulsions: Secondary | ICD-10-CM

## 2023-08-15 HISTORY — DX: Type 2 diabetes mellitus with ketoacidosis without coma: E11.10

## 2023-08-15 LAB — URINALYSIS, ROUTINE W REFLEX MICROSCOPIC
Bacteria, UA: NONE SEEN
Bilirubin Urine: NEGATIVE
Glucose, UA: >=500 mg/dL — AB
Ketones, ur: 20 mg/dL — AB
Leukocytes,Ua: NEGATIVE
Nitrite: NEGATIVE
Protein, ur: NEGATIVE mg/dL
Specific Gravity, Urine: 1.026 (ref 1.005–1.030)
pH: 5 (ref 5.0–8.0)

## 2023-08-15 LAB — BASIC METABOLIC PANEL
Anion gap: 20 — ABNORMAL HIGH (ref 5–15)
Anion gap: 12 (ref 5–15)
BUN: 37 mg/dL — ABNORMAL HIGH (ref 8–23)
BUN: 32 mg/dL — ABNORMAL HIGH (ref 8–23)
CO2: 14 mmol/L — ABNORMAL LOW (ref 22–32)
CO2: 20 mmol/L — ABNORMAL LOW (ref 22–32)
Calcium: 8.1 mg/dL — ABNORMAL LOW (ref 8.9–10.3)
Calcium: 7.9 mg/dL — ABNORMAL LOW (ref 8.9–10.3)
Chloride: 99 mmol/L (ref 98–111)
Chloride: 101 mmol/L (ref 98–111)
Creatinine, Ser: 0.97 mg/dL (ref 0.44–1.00)
Creatinine, Ser: 0.76 mg/dL (ref 0.44–1.00)
GFR, Estimated: >60 mL/min (ref >60)
GFR, Estimated: >60 mL/min (ref >60)
Glucose, Bld: 447 mg/dL — ABNORMAL HIGH (ref 70–99)
Glucose, Bld: 200 mg/dL — ABNORMAL HIGH (ref 70–99)
Potassium: 4.3 mmol/L (ref 3.5–5.1)
Potassium: 3.9 mmol/L (ref 3.5–5.1)
Sodium: 133 mmol/L — ABNORMAL LOW (ref 135–145)
Sodium: 133 mmol/L — ABNORMAL LOW (ref 135–145)

## 2023-08-15 LAB — CBC WITH DIFFERENTIAL/PLATELET
Abs Immature Granulocytes: 0.06 10*3/uL (ref 0.00–0.07)
Basophils Absolute: 0.1 10*3/uL (ref 0.0–0.1)
Basophils Relative: 1 %
Eosinophils Absolute: 0 10*3/uL (ref 0.0–0.5)
Eosinophils Relative: 0 %
HCT: 44.7 % (ref 36.0–46.0)
Hemoglobin: 14.3 g/dL (ref 12.0–15.0)
Immature Granulocytes: 0 %
Lymphocytes Relative: 3 %
Lymphs Abs: 0.5 10*3/uL — ABNORMAL LOW (ref 0.7–4.0)
MCH: 31.8 pg (ref 26.0–34.0)
MCHC: 32 g/dL (ref 30.0–36.0)
MCV: 99.6 fL (ref 80.0–100.0)
Monocytes Absolute: 0.2 10*3/uL (ref 0.1–1.0)
Monocytes Relative: 1 %
Neutro Abs: 13.9 10*3/uL — ABNORMAL HIGH (ref 1.7–7.7)
Neutrophils Relative %: 95 %
Platelets: 382 10*3/uL (ref 150–400)
RBC: 4.49 MIL/uL (ref 3.87–5.11)
RDW: 12.9 % (ref 11.5–15.5)
WBC: 14.7 10*3/uL — ABNORMAL HIGH (ref 4.0–10.5)
nRBC: 0 % (ref 0.0–0.2)

## 2023-08-15 LAB — COMPREHENSIVE METABOLIC PANEL
ALT: 13 U/L (ref 0–44)
AST: 25 U/L (ref 15–41)
Albumin: 3.5 g/dL (ref 3.5–5.0)
Alkaline Phosphatase: 110 U/L (ref 38–126)
Anion gap: 31 — ABNORMAL HIGH (ref 5–15)
BUN: 38 mg/dL — ABNORMAL HIGH (ref 8–23)
CO2: 10 mmol/L — ABNORMAL LOW (ref 22–32)
Calcium: 9.6 mg/dL (ref 8.9–10.3)
Chloride: 89 mmol/L — ABNORMAL LOW (ref 98–111)
Creatinine, Ser: 1.27 mg/dL — ABNORMAL HIGH (ref 0.44–1.00)
GFR, Estimated: 47 mL/min — ABNORMAL LOW (ref >60)
Glucose, Bld: 599 mg/dL (ref 70–99)
Potassium: 5.7 mmol/L — ABNORMAL HIGH (ref 3.5–5.1)
Sodium: 130 mmol/L — ABNORMAL LOW (ref 135–145)
Total Bilirubin: 2 mg/dL — ABNORMAL HIGH (ref 0.0–1.2)
Total Protein: 8.2 g/dL — ABNORMAL HIGH (ref 6.5–8.1)

## 2023-08-15 LAB — GLUCOSE, CAPILLARY
Glucose-Capillary: 258 mg/dL — ABNORMAL HIGH (ref 70–99)
Glucose-Capillary: 252 mg/dL — ABNORMAL HIGH (ref 70–99)
Glucose-Capillary: 223 mg/dL — ABNORMAL HIGH (ref 70–99)
Glucose-Capillary: 202 mg/dL — ABNORMAL HIGH (ref 70–99)
Glucose-Capillary: 169 mg/dL — ABNORMAL HIGH (ref 70–99)

## 2023-08-15 LAB — CBG MONITORING, ED
Glucose-Capillary: 594 mg/dL (ref 70–99)
Glucose-Capillary: 532 mg/dL (ref 70–99)
Glucose-Capillary: 363 mg/dL — ABNORMAL HIGH (ref 70–99)

## 2023-08-15 LAB — SEDIMENTATION RATE: Sed Rate: 57 mm/h — ABNORMAL HIGH (ref 0–30)

## 2023-08-15 LAB — MRSA NEXT GEN BY PCR, NASAL: MRSA by PCR Next Gen: DETECTED — AB

## 2023-08-15 LAB — RESP PANEL BY RT-PCR (RSV, FLU A&B, COVID)  RVPGX2
Influenza A by PCR: NEGATIVE
Influenza B by PCR: NEGATIVE
Resp Syncytial Virus by PCR: NEGATIVE
SARS Coronavirus 2 by RT PCR: NEGATIVE

## 2023-08-15 LAB — BLOOD GAS, VENOUS
Acid-base deficit: 17.4 mmol/L — ABNORMAL HIGH (ref 0.0–2.0)
Bicarbonate: 10.6 mmol/L — ABNORMAL LOW (ref 20.0–28.0)
O2 Saturation: 84.1 %
Patient temperature: 37
pCO2, Ven: 32 mm[Hg] — ABNORMAL LOW (ref 44–60)
pH, Ven: 7.13 — CL (ref 7.25–7.43)
pO2, Ven: 55 mm[Hg] — ABNORMAL HIGH (ref 32–45)

## 2023-08-15 LAB — LIPASE, BLOOD: Lipase: 20 U/L (ref 11–51)

## 2023-08-15 LAB — BETA-HYDROXYBUTYRIC ACID
Beta-Hydroxybutyric Acid: >8 mmol/L — ABNORMAL HIGH (ref 0.05–0.27)
Beta-Hydroxybutyric Acid: 7.85 mmol/L — ABNORMAL HIGH (ref 0.05–0.27)
Beta-Hydroxybutyric Acid: 2.45 mmol/L — ABNORMAL HIGH (ref 0.05–0.27)

## 2023-08-15 LAB — TROPONIN I (HIGH SENSITIVITY)
Troponin I (High Sensitivity): 14 ng/L (ref <18)
Troponin I (High Sensitivity): 16 ng/L (ref <18)
Troponin I (High Sensitivity): 20 ng/L — ABNORMAL HIGH (ref <18)

## 2023-08-15 LAB — PROCALCITONIN: Procalcitonin: 0.17 ng/mL

## 2023-08-15 MED ORDER — ACETAMINOPHEN 325 MG PO TABS
650.0000 mg | ORAL_TABLET | Freq: Four times a day (QID) | ORAL | Status: DC | PRN
  Administered 2023-08-15 – 2023-08-19 (×6): 650 mg via ORAL
  Filled 2023-08-15 (×6): qty 2

## 2023-08-15 MED ORDER — HEPARIN SODIUM (PORCINE) 5000 UNIT/ML IJ SOLN: 5000.0000 [IU] | Freq: Three times a day (TID) | INTRAMUSCULAR | Status: DC

## 2023-08-15 MED ORDER — SODIUM CHLORIDE 0.45 % IV SOLN
INTRAVENOUS | Status: DC
  Filled 2023-08-15 (×3): qty 75

## 2023-08-15 MED ORDER — VANCOMYCIN HCL 500 MG/100ML IV SOLN
500.0000 mg | INTRAVENOUS | Status: DC
  Filled 2023-08-15: qty 100

## 2023-08-15 MED ORDER — CHLORHEXIDINE GLUCONATE CLOTH 2 % EX PADS
6.0000 | MEDICATED_PAD | Freq: Every day | CUTANEOUS | Status: DC
  Administered 2023-08-15: 6 via TOPICAL

## 2023-08-15 MED ORDER — SODIUM CHLORIDE 0.9 % IV BOLUS
1000.0000 mL | Freq: Once | INTRAVENOUS | Status: AC
  Administered 2023-08-15: 1000 mL via INTRAVENOUS

## 2023-08-15 MED ORDER — SODIUM BICARBONATE 8.4 % IV SOLN
50.0000 meq | Freq: Once | INTRAVENOUS | Status: AC
  Administered 2023-08-15: 50 meq via INTRAVENOUS
  Filled 2023-08-15: qty 50

## 2023-08-15 MED ORDER — LORAZEPAM 2 MG/ML IJ SOLN: 2.0000 mg | INTRAMUSCULAR | Status: DC | PRN

## 2023-08-15 MED ORDER — RIVAROXABAN 20 MG PO TABS: 20.0000 mg | ORAL_TABLET | Freq: Every day | ORAL | Status: DC

## 2023-08-15 MED ORDER — LORAZEPAM 2 MG/ML IJ SOLN
2.0000 mg | INTRAMUSCULAR | Status: AC | PRN
  Administered 2023-08-16 – 2023-08-17 (×2): 2 mg via INTRAVENOUS
  Filled 2023-08-15 (×2): qty 1

## 2023-08-15 MED ORDER — DEXTROSE 50 % IV SOLN: 0.0000 mL | INTRAVENOUS | Status: DC | PRN

## 2023-08-15 MED ORDER — VANCOMYCIN HCL IN DEXTROSE 1-5 GM/200ML-% IV SOLN
1000.0000 mg | Freq: Once | INTRAVENOUS | Status: AC
  Administered 2023-08-15: 1000 mg via INTRAVENOUS
  Filled 2023-08-15: qty 200

## 2023-08-15 MED ORDER — RIVAROXABAN 15 MG PO TABS
15.0000 mg | ORAL_TABLET | Freq: Every day | ORAL | Status: DC
  Administered 2023-08-15 – 2023-08-16 (×2): 15 mg via ORAL
  Filled 2023-08-15 (×2): qty 1

## 2023-08-15 MED ORDER — SODIUM BICARBONATE 8.4 % IV SOLN
INTRAVENOUS | Status: DC
Start: 2023-08-15 — End: 2023-08-16
  Filled 2023-08-15: qty 1000
  Filled 2023-08-15: qty 150
  Filled 2023-08-15 (×2): qty 1000
  Filled 2023-08-15: qty 150

## 2023-08-15 MED ORDER — INSULIN REGULAR(HUMAN) IN NACL 100-0.9 UT/100ML-% IV SOLN
INTRAVENOUS | Status: DC
  Administered 2023-08-15: 3.2 [IU]/h via INTRAVENOUS
  Filled 2023-08-15: qty 100

## 2023-08-15 MED ORDER — ACETAMINOPHEN 650 MG RE SUPP: 650.0000 mg | RECTAL | Status: DC | PRN

## 2023-08-15 MED ORDER — MUPIROCIN 2 % EX OINT
1.0000 | TOPICAL_OINTMENT | Freq: Two times a day (BID) | CUTANEOUS | Status: DC
  Administered 2023-08-15 – 2023-08-19 (×8): 1 via NASAL
  Filled 2023-08-15 (×2): qty 22

## 2023-08-15 MED ORDER — INSULIN REGULAR(HUMAN) IN NACL 100-0.9 UT/100ML-% IV SOLN
INTRAVENOUS | Status: DC
  Administered 2023-08-15: 1.9 [IU]/h via INTRAVENOUS
  Administered 2023-08-15: 1.8 [IU]/h via INTRAVENOUS
  Administered 2023-08-15: 0.7 [IU]/h via INTRAVENOUS
  Administered 2023-08-16: 0.5 [IU]/h via INTRAVENOUS
  Administered 2023-08-16: 1 [IU]/h via INTRAVENOUS
  Filled 2023-08-15: qty 100

## 2023-08-15 MED ORDER — SODIUM CHLORIDE 0.9 % IV SOLN
2.0000 g | Freq: Two times a day (BID) | INTRAVENOUS | Status: DC
  Administered 2023-08-16: 2 g via INTRAVENOUS
  Filled 2023-08-15 (×2): qty 12.5

## 2023-08-15 MED ORDER — DEXTROSE IN LACTATED RINGERS 5 % IV SOLN: INTRAVENOUS | Status: DC

## 2023-08-15 MED ORDER — LACTATED RINGERS IV SOLN: INTRAVENOUS | Status: DC

## 2023-08-15 MED ORDER — CHLORHEXIDINE GLUCONATE CLOTH 2 % EX PADS
6.0000 | MEDICATED_PAD | Freq: Every day | CUTANEOUS | Status: DC
  Administered 2023-08-16 – 2023-08-19 (×3): 6 via TOPICAL

## 2023-08-15 MED ORDER — ONDANSETRON HCL 4 MG/2ML IJ SOLN
4.0000 mg | Freq: Once | INTRAMUSCULAR | Status: AC
  Administered 2023-08-15: 4 mg via INTRAVENOUS
  Filled 2023-08-15: qty 2

## 2023-08-15 MED ORDER — NICOTINE 21 MG/24HR TD PT24: 21.0000 mg | MEDICATED_PATCH | Freq: Every day | TRANSDERMAL | Status: DC | PRN

## 2023-08-15 MED ORDER — ONDANSETRON HCL 4 MG/2ML IJ SOLN
4.0000 mg | Freq: Four times a day (QID) | INTRAMUSCULAR | Status: DC | PRN
  Administered 2023-08-16: 4 mg via INTRAVENOUS
  Filled 2023-08-15: qty 2

## 2023-08-15 MED ORDER — CEFEPIME HCL 2 G IV SOLR
2.0000 g | Freq: Once | INTRAVENOUS | Status: AC
  Administered 2023-08-15: 2 g via INTRAVENOUS
  Filled 2023-08-15: qty 12.5

## 2023-08-15 MED ORDER — SODIUM CHLORIDE 0.9 % IV SOLN: 12.5000 mg | Freq: Four times a day (QID) | INTRAVENOUS | Status: AC | PRN

## 2023-08-15 MED ORDER — GABAPENTIN 300 MG PO CAPS
900.0000 mg | ORAL_CAPSULE | Freq: Every day | ORAL | Status: DC
  Administered 2023-08-15 – 2023-08-18 (×4): 900 mg via ORAL
  Filled 2023-08-15 (×4): qty 3

## 2023-08-15 MED ORDER — PANTOPRAZOLE SODIUM 40 MG PO TBEC
40.0000 mg | DELAYED_RELEASE_TABLET | Freq: Every day | ORAL | Status: DC
  Administered 2023-08-15 – 2023-08-19 (×5): 40 mg via ORAL
  Filled 2023-08-15 (×5): qty 1

## 2023-08-15 MED ORDER — LORAZEPAM 2 MG/ML IJ SOLN: 1.0000 mg | Freq: Four times a day (QID) | INTRAMUSCULAR | Status: DC | PRN

## 2023-08-15 MED ORDER — HYDRALAZINE HCL 20 MG/ML IJ SOLN: 5.0000 mg | Freq: Four times a day (QID) | INTRAMUSCULAR | Status: DC | PRN

## 2023-08-15 MED ORDER — METOPROLOL TARTRATE 5 MG/5ML IV SOLN: 5.0000 mg | INTRAVENOUS | Status: DC | PRN

## 2023-08-15 NOTE — Assessment & Plan Note (Addendum)
Home Xarelto dosing reduced to 15 mg daily with supper for creatinine clearance adjustment

## 2023-08-15 NOTE — Assessment & Plan Note (Signed)
As needed nicotine patch ordered for nicotine craving

## 2023-08-15 NOTE — Assessment & Plan Note (Addendum)
Patient presented with DKA, was not using insulin for the past few days.  Started on Endo tool along with bicarb infusion due to significant metabolic acidosis. Metabolic acidosis improved.  Worsening beta-hydroxybutyrate acid.  A1c of 9.8 -Stop bicarb infusion -Continue with Endo tool -Will transition once started showing improvement -Monitor BMP and beta-hydroxybutyrate acid

## 2023-08-15 NOTE — H&P (Addendum)
History and Physical   Michaela Johnson ZOX:[id number] DOB: [birth year] DOA: 08/15/2023  PCP: Michaela Hughs, NP  Patient coming from: home via EMS  I have personally briefly reviewed patient's old medical records in [facility] EMR.  Chief Concern: Intractable nausea  HPI: Ms. Michaela Johnson is a 63 year old female with history of insulin-dependent diabetes mellitus, GERD, atrial fibrillation on Xarelto, neuropathy, who presents emergency department for chief concerns of nausea vomiting since 6 AM on day of admission.  Vitals in the ED temperature 98.6, respiration 18, heart rate of 118, blood pressure 111/69, SpO2 100% on room air.  Serum sodium is 130, potassium 5.7, chloride 89, bicarb 10, nonfasting blood glucose 599,  BUN of 38, serum creatinine 1.27, EGFR 47, T. bili elevated at 2.0.  WBC 14.7, hemoglobin 14.3, platelets of 382.  High since troponin is 14.  COVID/influenza A/influenza B/RSV PCR were negative.  ED treatment: Sodium chloride 3 L bolus, insulin per endotool, ondansetron 4 mg IV one-time dose. ----------------------------- At bedside, patient is able to tell me her first and last name, age, current calendar year, current location.   She has been out of insulin for several days. She states her last insulin dose was Wednesday. She reports she called her medication in but they are on back log.   She denies diarrhea, dysuria, hematuria, blood in her stool. She vomited up food for two days and today, she is vomiting yellow stuff. She has not been able to keep anything down.   She denies chest pain. She is coughing. She smokes 4 cigarettes per day. She denies etoh use, last etoh was more than 1 month ago.   She reports her right toe lesion was noticed about two days now.   Social history: She denies history of IV and recreational drug use. She lives alone. She smokes 4 cigarettes per day. She denies current etoh use.   ROS: Constitutional: no weight change, no  fever ENT/Mouth: no sore throat, no rhinorrhea Eyes: no eye pain, no vision changes Cardiovascular: no chest pain, no dyspnea,  no edema, no palpitations Respiratory: no cough, no sputum, no wheezing Gastrointestinal: + nausea, + vomiting, no diarrhea, no constipation Genitourinary: no urinary incontinence, no dysuria, no hematuria Musculoskeletal: no arthralgias, no myalgias Skin: + skin lesions, no pruritus Neuro: + weakness, no loss of consciousness, no syncope Psych: no anxiety, no depression, + decrease appetite Heme/Lymph: no bruising, no bleeding  ED Course: Discussed with EDP, patient requiring hospitalization for DKA.   Assessment/Plan  Principal Problem:   DKA (diabetic ketoacidosis) (HCC) Active Problems:   Chronic alcohol use   Uncontrolled type 2 diabetes mellitus with hyperglycemia, with long-term current use of insulin (HCC)   Toe ulcer (HCC)   PAF (paroxysmal atrial fibrillation) (HCC)   Hypertension associated with diabetes (HCC)   Leukocytosis   Nicotine dependence   Head lice   Seizure (HCC)   Substance abuse (HCC)   Chronic anticoagulation   Depression, major, single episode, complete remission (HCC) dx'd 15 years ago   Cirrhosis of liver (HCC)   Metabolic acidosis   Hyponatremia   High anion gap metabolic acidosis   Intractable nausea and vomiting   Alopecia of scalp   Cellulitis of scalp   Cellulitis of right toe   AKI (acute kidney injury) (HCC)   Elevated troponin   Assessment and Plan:  * DKA (diabetic ketoacidosis) (HCC) Check portable chest x-ray, high sensitive troponin, procalcitonin, UA Status post sodium chloride 3 L bolus ordered by EDP Continue  insulin via Endo tool Discontinue LR 125 mL infusion and LR 5% at 125 mL/h due to high anion gap metabolic acidosis Sodium bicarbonate 1 amp one-time dose ordered on admission Sodium bicarbonate 75 mill equivalent in half-normal saline at 125 mL/h, 1 day ordered for CBG > 250 Sodium  bicarbonate 150 mill equivalent and dextrose 5% at 125 mL/h, 1 day ordered for CBG < 250  Toe ulcer (HCC) Right foot x-ray, 2 view ordered on admission Check sed rate and CRP Cefepime and vancomycin per pharmacy  Chronic alcohol use Ativan 2 mg IV as needed for anxiety, 2 doses ordered with instructions to administer as appropriate and then let provider know and if needed we can initiate CIWA precaution  Leukocytosis Suspect reactive in setting of DKA however we will check portable chest x-ray, procalcitonin, urine analysis  PAF (paroxysmal atrial fibrillation) (HCC) Xarelto dosing decreased to 15 mg q. supper due to decreased creatinine clearance calculated at 35 mL/minute  Nicotine dependence As needed nicotine patch ordered for nicotine craving  Elevated troponin Secondary to demand ischemia, continue current medical treatment per above  AKI (acute kidney injury) (HCC) Secondary to DKA, high anion gap metabolic acidosis Status post aggressive fluid hydration per EDP Continue with fluid bicarbonate hydration on admission, treat DKA per above Avoid nephrotoxic agents Check BMP in the a.m.  Cellulitis of right toe With possible abscesses and osteomyelitis Right foot x-ray ordered on admission Cefepime and vancomycin per pharmacy  Cellulitis of scalp In setting of uncontrolled insulin-dependent diabetes mellitus Cefepime and vancomycin per pharmacy  Intractable nausea and vomiting Ondansetron 4 mg IV every 6 hours as needed for nausea, vomiting, 5 days ordered Phenergan 12.5 mg IV every 6 hours.  For refractory nausea and vomiting, 1 day ordered  High anion gap metabolic acidosis Secondary to DKA Bicarbonate injection 50 mill: IV one-time dose ordered Sodium bicarbonate 75 mEq in dextrose 5% infusion at 125 mL/h, 1 day ordered for CBG > 250 Sodium bicarbonate 150 mill equivalent and D5% IV at 125 mL/h, continuous for 1 day for CBG < 250  Hyponatremia Secondary to  hyperosmolar hyperglycemia Serum corrected sodium is 142, Hillier 1999  Chronic anticoagulation Home Xarelto dosing reduced to 15 mg daily with supper for creatinine clearance adjustment  Seizure (HCC) Seizure precaution Ativan 2 mg IV as needed as needed for anxiety, seizure, 2 doses ordered with instructions to administer as appropriate and then let provider know  Prolonged QT-secondary to DKA, avoid scheduled QT prolonging agents  Chart reviewed.   DVT prophylaxis: Xarelto Code Status: full code Diet: N.p.o. 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 07/30/2020   Constipation    Depression    Diabetes mellitus without complication (HCC)    Dysrhythmia     PAF   GERD (gastroesophageal reflux disease)    Hypertension    Neuropathy    Nicotine dependence 07/31/2020   PAF (paroxysmal atrial fibrillation) (HCC)    Panic attacks    Pneumonia    Tobacco use    Vitamin D deficiency    Past Surgical History:  Procedure Laterality Date   ANKLE FUSION Right 07/30/2020   Procedure:  ANKLE FUSION;  Surgeon: Myrene Galas, MD;  Location: [facility] OR;  Service: Orthopedics;  Laterality: Right;   APPLICATION OF WOUND VAAPPLICATION OF WOUND VAC (Right Ankle)C (Right Ankle)  07/31/2020   APPLICATION OF WOUND VAC Right 07/30/2020   Procedure: APPLICATION OF WOUND VAC;  Surgeon: Myrene Galas, MD;  Location: [facility] OR;  Service: Orthopedics;  Laterality: Right;   HARDWARE REMOVAL Right 07/30/2020   Procedure: HARDWARE REMOVAL RIGHT ANKLE;  Surgeon: Myrene Galas, MD;  Location: [facility] OR;  Service: Orthopedics;  Laterality: Right;   HARDWARE  REMOVAL RIGHT ANKLE (Right Ankle  07/31/2020   INCISION AND DRAINAGE ABSCESS N/A 06/15/2023   Procedure: INCISION AND DRAINAGE NASAL ABSCESS;  Surgeon: Linus Salmons, MD;  Location: [facility] ORS;  Service: ENT;  Laterality: N/A;   INTRAMEDULLARY (IM) NAIL INTERTROCHANTERIC Right 01/04/2022   Procedure: INTRAMEDULLARY (IM) NAIL INTERTROCHANTRIC;  Surgeon: Christena Flake, MD;  Location: [facility] ORS;  Service: Orthopedics;  Laterality: Right;   ORIF ANKLE FRACTURE Right 07/04/2020   Procedure: OPEN REDUCTION INTERNAL FIXATION (ORIF) ANKLE FRACTURE;  Surgeon: Lyndle Herrlich, MD;  Location: [facility] ORS;  Service: Orthopedics;  Laterality: Right;   Social History:  reports that she has been smoking cigarettes, e-cigarettes, and cigars. She has a 41 pack-year smoking history. She has never used smokeless tobacco. She reports that she does not currently use alcohol after a past usage of about 6.0 standard drinks of alcohol per week. She reports that she does not currently use drugs after having used the following drugs: Marijuana. Frequency: 7.00 times per week.  Allergies  Allergen Reactions   Glipizide     Other reaction(s): Other (See Comments)   Metformin Diarrhea   Codeine Itching   Levaquin [Levofloxacin In D5w] Itching   Family History  Problem Relation Age of Onset   Diabetes Mother    CAD Father    Heart Problems Father    Heart failure Father    Diabetes Paternal Aunt    Family history: Family history reviewed and not pertinent  Prior to Admission medications   Medication Sig Start Date End Date Taking? Authorizing Provider  Cholecalciferol (VITAMIN D) 125 MCG (5000 UT) CAPS Take 1 capsule by mouth daily. 08/03/20   Montez Morita, PA-C  gabapentin (NEURONTIN) 300 MG capsule Take 900 mg by mouth at bedtime. 02/28/20   [provider]  JARDIANCE 25 MG TABS tablet Take 25 mg by mouth daily. 05/27/23   [provider]  LANTUS SOLOSTAR 100 UNIT/ML Solostar Pen Inject 20 Units into  the skin daily. This is your home dose of long-acting insulin. 06/17/23   Marrion Coy, MD  metoprolol succinate (TOPROL-XL) 25 MG 24 hr tablet Take 1 tablet (25 mg total) by mouth daily. Take with or immediately following a meal. 01/30/23   Gollan, Tollie Pizza, MD  Multiple Vitamin (MULTIVITAMIN WITH MINERALS) TABS tablet Take 1 tablet by mouth daily. 01/07/22   Arnetha Courser, MD  nicotine (NICODERM CQ - DOSED IN MG/24 HOURS) 21 mg/24hr patch Place 1 patch (21 mg total) onto the skin daily as needed (nicotine craving). 06/17/23   Marrion Coy, MD  NOVOLOG FLEXPEN 100 UNIT/ML FlexPen Inject 15 Units into the skin 3 (three) times daily with meals. 06/17/23   Marrion Coy, MD  omeprazole (PRILOSEC) 20 MG capsule Take 20 mg by mouth daily.    [provider]  OZEMPIC, 2 MG/DOSE, 8 MG/3ML SOPN Inject 2 mg into the skin once a week. Sunday 06/02/23   [provider]  rivaroxaban (XARELTO) 20 MG TABS tablet Take 1 tablet (20 mg total) by mouth daily with supper. 01/30/23   Antonieta Iba, MD  VICTOZA 18 MG/3ML SOPN Inject 4 mg into the skin daily. 11/20/22   [provider]   Physical Exam: Vitals:   08/15/23 1730 08/15/23 1800 08/15/23 1830 08/15/23 1900  BP: 114/67 129/63 (!) 120/51 (!) 118/48  Pulse: (!) 119 (!) 120 (!) 118 (!) 118  Resp: 13 (!) 22 (!) 25 (!) 25  Temp:      TempSrc:      SpO2: 100% 100% 98% 100%  Weight:      Height:       Constitutional: appears older than chronological age, chronically ill, and acutely ill, hectic appearing Eyes: PERRL, lids and conjunctivae normal HENMT: Mucous membranes are moist. Posterior pharynx clear of any exudate or lesions. Age-appropriate dentition. Hearing appropriate.  Alopecia Neck: normal, supple, no masses, no thyromegaly Respiratory: clear to auscultation bilaterally, no wheezing, no crackles. Normal respiratory effort. No accessory muscle use.  Cardiovascular: Regular rate and rhythm, no murmurs / rubs / gallops. No  extremity edema. 2+ pedal pulses. No carotid bruits.  Abdomen: Scaphoid abdomen no tenderness, no masses palpated, no hepatosplenomegaly. Bowel sounds positive.  Musculoskeletal: no clubbing / cyanosis. No joint deformity upper and lower extremities. Good ROM, no contractures, no atrophy. Normal muscle tone.  Skin: Diffuse, generalized skin rashes with evidence of excoriation.     Neurologic: Sensation intact. Strength 5/5 in all 4.  Psychiatric: Normal judgment and insight. Alert and oriented x 3. Normal mood.   EKG: independently reviewed, showing supraventricular tachycardia with rate of 127, QTc 581  Chest x-ray on Admission: I personally reviewed and I agree with radiologist reading as below.  DG Foot 2 Views Right Result Date: 08/15/2023 CLINICAL DATA:  Wound to the third and fourth toes EXAM: RIGHT FOOT - 2 VIEW COMPARISON:  None Available. FINDINGS: Partially imaged postsurgical changes of the tibia and hindfoot. Intramedullary rod extends beyond the plantar calcaneus. Screw traversing the calcaneus is fractured. There is no evidence of fracture or dislocation. Diffuse osteopenia and severe degenerative changes of the ankle and hindfoot with collapse of the calcaneus. Mild soft tissue swelling of the great, third, and fourth toes. IMPRESSION: 1. Soft tissue swelling of the first, third, and fourth toes. No radiographic evidence of osteomyelitis. 2. Diffuse osteopenia and severe degenerative changes of the ankle and hindfoot with collapse of the calcaneus. Electronically Signed   By: Agustin Cree M.D.   On: 08/15/2023 16:14   Portable chest x-ray (1 view) Result Date: 08/15/2023 CLINICAL DATA:  Diabetic ketoacidosis.  Nausea and vomiting. EXAM: PORTABLE CHEST 1 VIEW COMPARISON:  Chest radiograph dated June 16, 2022. FINDINGS: The heart size and mediastinal contours are within normal limits. Aortic atherosclerosis. No focal consolidation, pleural effusion, or pneumothorax. Similar mild  chronic reticular changes at the lung bases. Remote right-sided rib fractures. Chronic posttraumatic deformity of the left clavicle. No acute osseous abnormality identified. IMPRESSION: No acute cardiopulmonary findings. Electronically Signed   By: Hart Robinsons M.D.   On: 08/15/2023 14:34   Labs on Admission: I have personally reviewed following labs  CBC: Recent Labs  Lab 08/15/23 1231  WBC 14.7*  NEUTROABS 13.9*  HGB 14.3  HCT 44.7  MCV 99.6  PLT 382   Basic Metabolic Panel: Recent Labs  Lab 08/15/23 1231 08/15/23 1604  NA 130* 133*  K 5.7* 4.3  CL 89* 99  CO2 10* 14*  GLUCOSE  599* 447*  BUN 38* 37*  CREATININE 1.27* 0.97  CALCIUM 9.6 8.1*   GFR: Estimated Creatinine Clearance: 46.3 mL/min (by C-G formula based on SCr of 0.97 mg/dL).  Liver Function Tests: Recent Labs  Lab 08/15/23 1231  AST 25  ALT 13  ALKPHOS 110  BILITOT 2.0*  PROT 8.2*  ALBUMIN 3.5   Recent Labs  Lab 08/15/23 1231  LIPASE 20   CBG: Recent Labs  Lab 08/15/23 1350 08/15/23 1513 08/15/23 1636 08/15/23 1750  GLUCAP 594* 532* 363* 258*   Urine analysis:    Component Value Date/Time   COLORURINE YELLOW (A) 06/15/2023 0855   APPEARANCEUR HAZY (A) 06/15/2023 0855   LABSPEC 1.032 (H) 06/15/2023 0855   PHURINE 7.0 06/15/2023 0855   GLUCOSEU >=500 (A) 06/15/2023 0855   HGBUR NEGATIVE 06/15/2023 0855   BILIRUBINUR NEGATIVE 06/15/2023 0855   KETONESUR 20 (A) 06/15/2023 0855   PROTEINUR 100 (A) 06/15/2023 0855   NITRITE NEGATIVE 06/15/2023 0855   LEUKOCYTESUR MODERATE (A) 06/15/2023 0855   CRITICAL CARE Performed by: Dr. Sedalia Muta  Total critical care time: 40 minutes  Critical care time was exclusive of separately billable procedures and treating other patients.  Critical care was necessary to treat or prevent imminent or life-threatening deterioration.  Critical care was time spent personally by me on the following activities: development of treatment plan with patient, as well  as nursing, pharmacy, discussions with consultants, evaluation of patient's response to treatment, examination of patient, obtaining history from patient or surrogate, ordering and performing treatments and interventions, ordering and review of laboratory studies, ordering and review of radiographic studies, pulse oximetry and re-evaluation of patient's condition.  This document was prepared using Dragon Voice Recognition software and may include unintentional dictation errors.  Dr. Sedalia Muta [facility]  If 7PM-7AM, please contact overnight-coverage provider If 7AM-7PM, please contact day attending provider [email or web address]  08/15/2023, 7:10 PM

## 2023-08-15 NOTE — ED Triage Notes (Signed)
Pt presents to ER from home, reports developed nausea yesterday and today she has been vomiting. Pt talks in complete sentences no respiratory distress noted. Initial BP 89/43.

## 2023-08-15 NOTE — ED Provider Triage Note (Signed)
Emergency Medicine Provider Triage Evaluation Note  Michaela Johnson , a 63 y.o. female  was evaluated in triage.  Pt complains of vomiting, low blood pressure.  Review of Systems  Positive:  Negative:   Physical Exam  BP (!) 93/46 (BP Location: Right Arm)   Pulse (!) 115   Temp 98.6 F (37 C) (Oral)   Resp 18   Ht 5\' 2"  (1.575 m)   Wt 49.9 kg   SpO2 96%   BMI 20.12 kg/m  Gen:   Awake, no distress   Resp:  Normal effort  MSK:   Moves extremities without difficulty  Other:    Medical Decision Making  Medically screening exam initiated at 12:53 PM.  Appropriate orders placed.  Nicholle Falzon was informed that the remainder of the evaluation will be completed by another provider, this initial triage assessment does not replace that evaluation, and the importance of remaining in the ED until their evaluation is complete.  Blood pressure low, nursing staff to start IV fluids labs   Leda, [place], PA-C 08/15/23 1253

## 2023-08-15 NOTE — Consult Note (Addendum)
Pharmacy Antibiotic Note  Michaela Johnson is a 63 y.o. female admitted on 08/15/2023 with DKA.  Pharmacy has been consulted for vancomycin and cefepime dosing for cellulitis (ulcer of right toe and leukocytosis).  Plan: Give vancomycin 1000 mg IV x 1, then start vancomycin 500 mg IV every 24 hours Estimated AUC 413, Cmin 11.4 Wt 49.9 kg, Scr 1.27, Vd 0.72 Vancomycin levels at steady state or as clinically indicated Cefepime 2 grams IV every 12 hours Follow renal function and cultures for adjustments  Height: 5\' 2"  (157.5 cm) Weight: 49.9 kg (110 lb) IBW/kg (Calculated) : 50.1  Temp (24hrs), Avg:98.6 F (37 C), Min:98.6 F (37 C), Max:98.6 F (37 C)  Recent Labs  Lab 08/15/23 1231  WBC 14.7*  CREATININE 1.27*    Estimated Creatinine Clearance: 35.3 mL/min (A) (by C-G formula based on SCr of 1.27 mg/dL (H)).    Allergies  Allergen Reactions   Glipizide     Other reaction(s): Other (See Comments)   Metformin Diarrhea   Codeine Itching   Levaquin [Levofloxacin In D5w] Itching    Antimicrobials this admission: vancomycin 1/25 >>  cefepime 1/125 >>   Dose adjustments this admission: N/A  Microbiology results: 1/25 BCx: pending   Thank you for allowing pharmacy to be a part of this patient's care.  Barrie Folk, PharmD 08/15/2023 4:00 PM

## 2023-08-15 NOTE — ED Notes (Signed)
First nurse note:  From home, [facility] for N/V since 6am. Is diabetic, 376 CBG, has not been taking insulin (ran out--maybe since yesterday). HR 90, 118/58, 100%, could not get temp "low" on EMS thermometer.  Also per EMS, pt urinated on self in wheelchair.

## 2023-08-15 NOTE — Assessment & Plan Note (Signed)
Suspect reactive in setting of DKA however we will check portable chest x-ray, procalcitonin, urine analysis

## 2023-08-15 NOTE — Assessment & Plan Note (Signed)
Ondansetron 4 mg IV every 6 hours as needed for nausea, vomiting, 5 days ordered Phenergan 12.5 mg IV every 6 hours.  For refractory nausea and vomiting, 1 day ordered

## 2023-08-15 NOTE — Assessment & Plan Note (Signed)
Secondary to hyperosmolar hyperglycemia Serum corrected sodium is 142, [place] 1999

## 2023-08-15 NOTE — ED Provider Notes (Signed)
[facility] Provider Note    Event Date/Time   First MD Initiated Contact with Patient 08/15/23 1258     (approximate)  History   Chief Complaint: Emesis and Hypotension  HPI  Michaela Johnson is a 63 y.o. female with a past medical history of alcohol use, anxiety, diabetes, hypertension, presents to the emergency department for nausea vomiting generalized fatigue and weakness.  According to the patient she has been out of her insulin for 2 days, states she went to the pharmacy yesterday try to get it but they did not have it.  Patient states since this morning she has been nauseated with frequent episodes of vomiting has been feeling very weak.  Patient found to be hypotensive 93/46 upon arrival with EMS.  Patient denies any diarrhea denies any abdominal pain.  No chest pain.  Physical Exam   Triage Vital Signs: ED Triage Vitals  Encounter Vitals Group     BP 08/15/23 1215 (!) 89/43     Systolic BP Percentile --      Diastolic BP Percentile --      Pulse Rate 08/15/23 1215 (!) 115     Resp 08/15/23 1215 18     Temp 08/15/23 1215 98.6 F (37 C)     Temp Source 08/15/23 1215 Oral     SpO2 08/15/23 1215 96 %     Weight 08/15/23 1241 110 lb (49.9 kg)     Height 08/15/23 1241 5\' 2"  (1.575 m)     Head Circumference --      Peak Flow --      Pain Score 08/15/23 1241 0     Pain Loc --      Pain Education --      Exclude from Growth Chart --     Most recent vital signs: Vitals:   08/15/23 1215 08/15/23 1234  BP: (!) 89/43 (!) 93/46  Pulse: (!) 115   Resp: 18   Temp: 98.6 F (37 C)   SpO2: 96%     General: Awake, no distress.  Very dry appearing mucous membranes.  Frail/cachectic appearing. CV:  Good peripheral perfusion.  Regular rhythm rate around 120 bpm. Resp:  Normal effort.  Equal breath sounds bilaterally.  Abd:  No distention.  Soft, nontender.  No rebound or guarding.  ED Results / Procedures / Treatments   EKG  EKG viewed and  interpreted by myself shows tachycardia 127 bpm with a widened QRS, normal axis, normal intervals nonspecific ST changes.  Morphology is largely unchanged from prior EKG showing a left bundle branch block.  MEDICATIONS ORDERED IN ED: Medications  sodium chloride 0.9 % bolus 1,000 mL (has no administration in time range)  sodium chloride 0.9 % bolus 1,000 mL (has no administration in time range)  sodium chloride 0.9 % bolus 1,000 mL (1,000 mLs Intravenous New Bag/Given 08/15/23 1238)  ondansetron (ZOFRAN) injection 4 mg (4 mg Intravenous Given 08/15/23 1237)     IMPRESSION / MDM / ASSESSMENT AND PLAN / ED COURSE  I reviewed the triage vital signs and the nursing notes.  Patient's presentation is most consistent with acute presentation with potential threat to life or bodily function.  Patient presents to the emergency department with nausea vomiting appears very dehydrated clinically.  Tachycardic with a wide-complex QRS on EKG.  Given the patient's nausea vomiting tachycardia very dry mucous membranes highly suspect diabetic ketoacidosis.  Will check labs including a VBG and a beta hydroxybutyric acid.  Will begin hydration  with 2 L of normal saline and continue to closely monitor.  Patient's lab work has resulted showing significant hyperglycemia 599 with a potassium of 5.7 and anion gap of 31 consistent with diabetic ketoacidosis.  WBC of 14,000 otherwise reassuring CBC.  Lipase shows no concerning findings.  Patient's COVID/flu/RSV is negative.  I have added a beta hydroxybutyric acid as well as a VBG as a precaution.  Patient receiving 2 L normal saline IV bolus.  I have ordered an insulin infusion.  We will admit to the hospital service for further workup and treatment.  CRITICAL CARE Performed by: Minna Antis   Total critical care time: 30 minutes  Critical care time was exclusive of separately billable procedures and treating other patients.  Critical care was necessary to  treat or prevent imminent or life-threatening deterioration.  Critical care was time spent personally by me on the following activities: development of treatment plan with patient and/or surrogate as well as nursing, discussions with consultants, evaluation of patient's response to treatment, examination of patient, obtaining history from patient or surrogate, ordering and performing treatments and interventions, ordering and review of laboratory studies, ordering and review of radiographic studies, pulse oximetry and re-evaluation of patient's condition.   FINAL CLINICAL IMPRESSION(S) / ED DIAGNOSES   Diabetic ketoacidosis    Note:  This document was prepared using Dragon voice recognition software and may include unintentional dictation errors.   Minna Antis, MD 08/15/23 1345

## 2023-08-15 NOTE — Assessment & Plan Note (Addendum)
Secondary to demand ischemia, flat curve, continue current medical treatment per above

## 2023-08-15 NOTE — Assessment & Plan Note (Signed)
With possible abscesses and osteomyelitis Right foot x-ray ordered on admission Cefepime and vancomycin per pharmacy

## 2023-08-15 NOTE — Assessment & Plan Note (Signed)
Xarelto dosing decreased to 15 mg q. supper due to decreased creatinine clearance calculated at 35 mL/minute

## 2023-08-15 NOTE — Assessment & Plan Note (Signed)
Ativan 2 mg IV as needed for anxiety, 2 doses ordered with instructions to administer as appropriate and then let provider know and if needed we can initiate CIWA precaution

## 2023-08-15 NOTE — Hospital Course (Addendum)
Ms. Michaela Johnson is a 63 year old female with history of insulin-dependent diabetes mellitus, GERD, atrial fibrillation on Xarelto, neuropathy, who presents emergency department for chief concerns of nausea vomiting since 6 AM on day of admission.  Vitals in the ED temperature 98.6, respiration 18, heart rate of 118, blood pressure 111/69, SpO2 100% on room air.  Serum sodium is 130, potassium 5.7, chloride 89, bicarb 10, nonfasting blood glucose 599,  BUN of 38, serum creatinine 1.27, EGFR 47, T. bili elevated at 2.0.  WBC 14.7, hemoglobin 14.3, platelets of 382.  High since troponin is 14.  COVID/influenza A/influenza B/RSV PCR were negative.  ED treatment: Sodium chloride 3 L bolus, insulin per endotool, ondansetron 4 mg IV one-time dose.

## 2023-08-15 NOTE — Inpatient Diabetes Management (Signed)
Inpatient Diabetes Program Recommendations  AACE/ADA: New Consensus Statement on Inpatient Glycemic Control (2015)  Target Ranges:  Prepandial:   less than 140 mg/dL      Peak postprandial:   less than 180 mg/dL (1-2 hours)      Critically ill patients:  140 - 180 mg/dL   Lab Results  Component Value Date   GLUCAP 594 (HH) 08/15/2023   HGBA1C 12.0 (H) 01/30/2023    Review of Glycemic Control  Diabetes history: DM2 Outpatient Diabetes medications: Lantus 20 daily, Novolog 15 TID, Jardiance 25 mg daily Current orders for Inpatient glycemic control: IV insulin  Inpatient Diabetes Program Recommendations:    Received consult for DKA.  Continue IV insulin until criteria met for discontinuation. Give SQ Semglee 1-2 hours prior to discontinuation of drip.  Diabetes Coordinator working remotely over w/e. Will f/u with pt on 1/27.  Note pt was seen previously be Diabetes Coordinator on past 2 admissions in Nov 2024.  Thank you. Ailene Ards, RD, LDN, CDCES Inpatient Diabetes Coordinator [phone]

## 2023-08-15 NOTE — Assessment & Plan Note (Addendum)
Secondary to DKA Gap With mild reopening after closing. -Continue with Endo tool -Continue to monitor

## 2023-08-15 NOTE — Progress Notes (Signed)
Notified Dr. Sedalia Muta of troponin of 20. MD acknowledged. No new orders given.

## 2023-08-15 NOTE — Assessment & Plan Note (Signed)
Seizure precaution Ativan 2 mg IV as needed as needed for anxiety, seizure, 2 doses ordered with instructions to administer as appropriate and then let provider know

## 2023-08-15 NOTE — Assessment & Plan Note (Addendum)
Secondary to DKA, high anion gap metabolic acidosis Status post aggressive fluid hydration per EDP Continue with fluid bicarbonate hydration on admission, treat DKA per above Avoid nephrotoxic agents Check BMP in the a.m.

## 2023-08-15 NOTE — Assessment & Plan Note (Signed)
In setting of uncontrolled insulin-dependent diabetes mellitus Cultures were taken by ID -Currently on vancomycin

## 2023-08-15 NOTE — Assessment & Plan Note (Signed)
Right foot x-ray, without any obvious osteo Elevated CRP and ESR. Blood cultures growing MRSA -Continue with vancomycin -MRI ordered to rule out osteo-

## 2023-08-15 NOTE — Progress Notes (Signed)
Notified Dr. Sedalia Muta that patient's ECG rhythm looks similar to EKG done earlier in ED. EKG ordered. MD acknowledged and aware of new EKG results in epic. No further orders.

## 2023-08-16 ENCOUNTER — Inpatient Hospital Stay: Payer: Medicaid Other

## 2023-08-16 ENCOUNTER — Inpatient Hospital Stay (HOSPITAL_COMMUNITY)
Admit: 2023-08-16 | Discharge: 2023-08-16 | Disposition: A | Discharge status: Home / Self Care | Payer: Medicaid Other | Attending: Internal Medicine | Admitting: Internal Medicine

## 2023-08-16 DIAGNOSIS — D72829 Elevated white blood cell count, unspecified: Secondary | ICD-10-CM | POA: Diagnosis not present

## 2023-08-16 DIAGNOSIS — R7881 Bacteremia: Secondary | ICD-10-CM | POA: Diagnosis not present

## 2023-08-16 DIAGNOSIS — E1165 Type 2 diabetes mellitus with hyperglycemia: Secondary | ICD-10-CM | POA: Diagnosis not present

## 2023-08-16 DIAGNOSIS — B9562 Methicillin resistant Staphylococcus aureus infection as the cause of diseases classified elsewhere: Secondary | ICD-10-CM | POA: Diagnosis present

## 2023-08-16 DIAGNOSIS — E1110 Type 2 diabetes mellitus with ketoacidosis without coma: Secondary | ICD-10-CM | POA: Diagnosis not present

## 2023-08-16 LAB — BASIC METABOLIC PANEL
Anion gap: 14 (ref 5–15)
Anion gap: 11 (ref 5–15)
Anion gap: 16 — ABNORMAL HIGH (ref 5–15)
Anion gap: 14 (ref 5–15)
Anion gap: 14 (ref 5–15)
BUN: 31 mg/dL — ABNORMAL HIGH (ref 8–23)
BUN: 29 mg/dL — ABNORMAL HIGH (ref 8–23)
BUN: 24 mg/dL — ABNORMAL HIGH (ref 8–23)
BUN: 21 mg/dL (ref 8–23)
BUN: 19 mg/dL (ref 8–23)
CO2: 21 mmol/L — ABNORMAL LOW (ref 22–32)
CO2: 23 mmol/L (ref 22–32)
CO2: 23 mmol/L (ref 22–32)
CO2: 24 mmol/L (ref 22–32)
CO2: 24 mmol/L (ref 22–32)
Calcium: 7.9 mg/dL — ABNORMAL LOW (ref 8.9–10.3)
Calcium: 7.8 mg/dL — ABNORMAL LOW (ref 8.9–10.3)
Calcium: 8.2 mg/dL — ABNORMAL LOW (ref 8.9–10.3)
Calcium: 8.5 mg/dL — ABNORMAL LOW (ref 8.9–10.3)
Calcium: 8.2 mg/dL — ABNORMAL LOW (ref 8.9–10.3)
Chloride: 101 mmol/L (ref 98–111)
Chloride: 101 mmol/L (ref 98–111)
Chloride: 97 mmol/L — ABNORMAL LOW (ref 98–111)
Chloride: 97 mmol/L — ABNORMAL LOW (ref 98–111)
Chloride: 96 mmol/L — ABNORMAL LOW (ref 98–111)
Creatinine, Ser: 0.8 mg/dL (ref 0.44–1.00)
Creatinine, Ser: 0.71 mg/dL (ref 0.44–1.00)
Creatinine, Ser: 0.83 mg/dL (ref 0.44–1.00)
Creatinine, Ser: 0.65 mg/dL (ref 0.44–1.00)
Creatinine, Ser: 0.63 mg/dL (ref 0.44–1.00)
GFR, Estimated: >60 mL/min (ref >60)
GFR, Estimated: >60 mL/min (ref >60)
GFR, Estimated: >60 mL/min (ref >60)
GFR, Estimated: >60 mL/min (ref >60)
GFR, Estimated: >60 mL/min (ref >60)
Glucose, Bld: 202 mg/dL — ABNORMAL HIGH (ref 70–99)
Glucose, Bld: 210 mg/dL — ABNORMAL HIGH (ref 70–99)
Glucose, Bld: 275 mg/dL — ABNORMAL HIGH (ref 70–99)
Glucose, Bld: 170 mg/dL — ABNORMAL HIGH (ref 70–99)
Glucose, Bld: 138 mg/dL — ABNORMAL HIGH (ref 70–99)
Potassium: 3.8 mmol/L (ref 3.5–5.1)
Potassium: 3.5 mmol/L (ref 3.5–5.1)
Potassium: 3.8 mmol/L (ref 3.5–5.1)
Potassium: 3.7 mmol/L (ref 3.5–5.1)
Potassium: 4 mmol/L (ref 3.5–5.1)
Sodium: 136 mmol/L (ref 135–145)
Sodium: 135 mmol/L (ref 135–145)
Sodium: 136 mmol/L (ref 135–145)
Sodium: 135 mmol/L (ref 135–145)
Sodium: 134 mmol/L — ABNORMAL LOW (ref 135–145)

## 2023-08-16 LAB — BLOOD CULTURE ID PANEL (REFLEXED) - BCID2

## 2023-08-16 LAB — BETA-HYDROXYBUTYRIC ACID
Beta-Hydroxybutyric Acid: 2.58 mmol/L — ABNORMAL HIGH (ref 0.05–0.27)
Beta-Hydroxybutyric Acid: 5.15 mmol/L — ABNORMAL HIGH (ref 0.05–0.27)
Beta-Hydroxybutyric Acid: 3.52 mmol/L — ABNORMAL HIGH (ref 0.05–0.27)
Beta-Hydroxybutyric Acid: 3.7 mmol/L — ABNORMAL HIGH (ref 0.05–0.27)

## 2023-08-16 LAB — GLUCOSE, CAPILLARY
Glucose-Capillary: 126 mg/dL — ABNORMAL HIGH (ref 70–99)
Glucose-Capillary: 128 mg/dL — ABNORMAL HIGH (ref 70–99)
Glucose-Capillary: 132 mg/dL — ABNORMAL HIGH (ref 70–99)
Glucose-Capillary: 137 mg/dL — ABNORMAL HIGH (ref 70–99)
Glucose-Capillary: 137 mg/dL — ABNORMAL HIGH (ref 70–99)
Glucose-Capillary: 138 mg/dL — ABNORMAL HIGH (ref 70–99)
Glucose-Capillary: 141 mg/dL — ABNORMAL HIGH (ref 70–99)
Glucose-Capillary: 150 mg/dL — ABNORMAL HIGH (ref 70–99)
Glucose-Capillary: 166 mg/dL — ABNORMAL HIGH (ref 70–99)
Glucose-Capillary: 173 mg/dL — ABNORMAL HIGH (ref 70–99)
Glucose-Capillary: 176 mg/dL — ABNORMAL HIGH (ref 70–99)
Glucose-Capillary: 186 mg/dL — ABNORMAL HIGH (ref 70–99)
Glucose-Capillary: 188 mg/dL — ABNORMAL HIGH (ref 70–99)
Glucose-Capillary: 189 mg/dL — ABNORMAL HIGH (ref 70–99)
Glucose-Capillary: 194 mg/dL — ABNORMAL HIGH (ref 70–99)
Glucose-Capillary: 197 mg/dL — ABNORMAL HIGH (ref 70–99)
Glucose-Capillary: 199 mg/dL — ABNORMAL HIGH (ref 70–99)
Glucose-Capillary: 200 mg/dL — ABNORMAL HIGH (ref 70–99)
Glucose-Capillary: 201 mg/dL — ABNORMAL HIGH (ref 70–99)
Glucose-Capillary: 211 mg/dL — ABNORMAL HIGH (ref 70–99)
Glucose-Capillary: 223 mg/dL — ABNORMAL HIGH (ref 70–99)
Glucose-Capillary: 257 mg/dL — ABNORMAL HIGH (ref 70–99)
Glucose-Capillary: 257 mg/dL — ABNORMAL HIGH (ref 70–99)

## 2023-08-16 LAB — CBC
HCT: 32.6 % — ABNORMAL LOW (ref 36.0–46.0)
Hemoglobin: 11.4 g/dL — ABNORMAL LOW (ref 12.0–15.0)
MCH: 31.7 pg (ref 26.0–34.0)
MCHC: 35 g/dL (ref 30.0–36.0)
MCV: 90.6 fL (ref 80.0–100.0)
Platelets: 381 10*3/uL (ref 150–400)
RBC: 3.6 MIL/uL — ABNORMAL LOW (ref 3.87–5.11)
RDW: 12.9 % (ref 11.5–15.5)
WBC: 13.1 10*3/uL — ABNORMAL HIGH (ref 4.0–10.5)
nRBC: 0 % (ref 0.0–0.2)

## 2023-08-16 LAB — HEMOGLOBIN A1C
Hgb A1c MFr Bld: 9.8 % — ABNORMAL HIGH (ref 4.8–5.6)
Mean Plasma Glucose: 234.56 mg/dL

## 2023-08-16 LAB — C-REACTIVE PROTEIN: CRP: 1.8 mg/dL — ABNORMAL HIGH (ref <1.0)

## 2023-08-16 LAB — SEDIMENTATION RATE: Sed Rate: 45 mm/h — ABNORMAL HIGH (ref 0–30)

## 2023-08-16 MED ORDER — DEXTROSE IN LACTATED RINGERS 5 % IV SOLN: INTRAVENOUS | Status: DC

## 2023-08-16 MED ORDER — VANCOMYCIN HCL 500 MG/100ML IV SOLN
500.0000 mg | Freq: Two times a day (BID) | INTRAVENOUS | Status: DC
  Administered 2023-08-16 – 2023-08-19 (×7): 500 mg via INTRAVENOUS
  Filled 2023-08-16 (×8): qty 100

## 2023-08-16 MED ORDER — VANCOMYCIN HCL 500 MG/100ML IV SOLN
500.0000 mg | Freq: Two times a day (BID) | INTRAVENOUS | Status: DC
  Filled 2023-08-16: qty 100

## 2023-08-16 MED ORDER — LACTATED RINGERS IV SOLN: INTRAVENOUS | Status: AC

## 2023-08-16 MED ORDER — POTASSIUM CHLORIDE 10 MEQ/100ML IV SOLN
10.0000 meq | INTRAVENOUS | Status: AC
  Administered 2023-08-16 (×2): 10 meq via INTRAVENOUS
  Filled 2023-08-16 (×2): qty 100

## 2023-08-16 NOTE — Consult Note (Signed)
Pharmacy Antibiotic Note  Michaela Johnson is a 63 y.o. female admitted on 08/15/2023 with DKA.  Pharmacy has been consulted for vancomycin and cefepime dosing for cellulitis (ulcer of right toe and leukocytosis).  Plan: Adjust vancomycin dose to 500 mg IV every 12 hours Estimated AUC 538, Cmin 16.8  IBW, Scr 0.83, Vd 0.72 Vancomycin levels at steady state or as clinically indicated Follow renal function and cultures for adjustments  Height: 5\' 2"  (157.5 cm) Weight: 52.3 kg (115 lb 4.8 oz) IBW/kg (Calculated) : 50.1  Temp (24hrs), Avg:98.3 F (36.8 C), Min:98 F (36.7 C), Max:99 F (37.2 C)  Recent Labs  Lab 08/15/23 1231 08/15/23 1604 08/15/23 2141 08/16/23 0133 08/16/23 0336 08/16/23 0337 08/16/23 0857  WBC 14.7*  --   --   --  13.1*  --   --   CREATININE 1.27* 0.97 0.76 0.80  --  0.71 0.83    Estimated Creatinine Clearance: 54.2 mL/min (by C-G formula based on SCr of 0.83 mg/dL).    Allergies  Allergen Reactions   Glipizide     Other reaction(s): Other (See Comments)   Metformin Diarrhea   Codeine Itching   Levaquin [Levofloxacin In D5w] Itching    Antimicrobials this admission: vancomycin 1/25 >>  cefepime 1/125 >>   Dose adjustments this admission: N/A  Microbiology results: 1/25 BCx: MRSA   Thank you for allowing pharmacy to be a part of this patient's care.  Barrie Folk, PharmD 08/16/2023 1:02 PM

## 2023-08-16 NOTE — Progress Notes (Signed)
ID MRSA bacteremia -vigilanz alert Full consult to follow Chart reviewed 63 yr female with DM on insulin,  Afib, Neuropathy, ankle fusion Recent nasal abscess ( NOV 2024) I/D and it was MRSA and treated with Antibiocs for 7 days including linezolid PT admitted with nausea , vomiting and missing insulin in a few days. Vitals in the ED  08/15/23 12:15  BP 89/43 (L)  Temp 98.6 F (37 C)  Pulse Rate 115 !  Resp 18  SpO2 96 %    Latest Reference Range & Units 08/15/23 12:31  WBC 4.0 - 10.5 K/uL 14.7 (H)  Hemoglobin 12.0 - 15.0 g/dL 81.1  HCT 91.4 - 78.2 % 44.7  Platelets 150 - 400 K/uL 382  Creatinine 0.44 - 1.00 mg/dL 9.56 (H)    Latest Reference Range & Units 08/15/23 12:31  Sodium 135 - 145 mmol/L 130 (L)  Potassium 3.5 - 5.1 mmol/L 5.7 (H)  Chloride 98 - 111 mmol/L 89 (L)  CO2 22 - 32 mmol/L 10 (L)  Glucose 70 - 99 mg/dL 213 (HH)    Found to have severe hyperglycemia, hyponatremia Co2 10- diagnosed with DKA , admitted to step down In ICU and managed with fluids and insulin Was on vanco and cefepime for toe wounds Now has MRSA bacteremia Aki has resolved Adjust vanco dose- informed pharmacist Culture scalp wound and toe wound Will repeat blood culture tomorrow 2 d echo is needed Discussed the management with care team

## 2023-08-16 NOTE — Plan of Care (Signed)
  Problem: Education: Goal: Ability to describe self-care measures that may prevent or decrease complications (Diabetes Survival Skills Education) will improve Outcome: Progressing Goal: Individualized Educational Video(s) Outcome: Progressing   Problem: Coping: Goal: Ability to adjust to condition or change in health will improve Outcome: Progressing   Problem: Fluid Volume: Goal: Ability to maintain a balanced intake and output will improve Outcome: Progressing   Problem: Health Behavior/Discharge Planning: Goal: Ability to identify and utilize available resources and services will improve Outcome: Progressing Goal: Ability to manage health-related needs will improve Outcome: Progressing   Problem: Metabolic: Goal: Ability to maintain appropriate glucose levels will improve Outcome: Progressing   Problem: Nutritional: Goal: Maintenance of adequate nutrition will improve Outcome: Progressing Goal: Progress toward achieving an optimal weight will improve Outcome: Progressing   Problem: Skin Integrity: Goal: Risk for impaired skin integrity will decrease Outcome: Progressing   Problem: Tissue Perfusion: Goal: Adequacy of tissue perfusion will improve Outcome: Progressing   Problem: Education: Goal: Ability to describe self-care measures that may prevent or decrease complications (Diabetes Survival Skills Education) will improve Outcome: Progressing Goal: Individualized Educational Video(s) Outcome: Progressing   Problem: Cardiac: Goal: Ability to maintain an adequate cardiac output will improve Outcome: Progressing   Problem: Health Behavior/Discharge Planning: Goal: Ability to identify and utilize available resources and services will improve Outcome: Progressing Goal: Ability to manage health-related needs will improve Outcome: Progressing   Problem: Fluid Volume: Goal: Ability to achieve a balanced intake and output will improve Outcome: Progressing    Problem: Metabolic: Goal: Ability to maintain appropriate glucose levels will improve Outcome: Progressing   Problem: Nutritional: Goal: Maintenance of adequate nutrition will improve Outcome: Progressing Goal: Maintenance of adequate weight for body size and type will improve Outcome: Progressing   Problem: Respiratory: Goal: Will regain and/or maintain adequate ventilation Outcome: Progressing   Problem: Urinary Elimination: Goal: Ability to achieve and maintain adequate renal perfusion and functioning will improve Outcome: Progressing   Problem: Clinical Measurements: Goal: Ability to avoid or minimize complications of infection will improve Outcome: Progressing   Problem: Skin Integrity: Goal: Skin integrity will improve Outcome: Progressing   Problem: Education: Goal: Knowledge of General Education information will improve Description: Including pain rating scale, medication(s)/side effects and non-pharmacologic comfort measures Outcome: Progressing   Problem: Health Behavior/Discharge Planning: Goal: Ability to manage health-related needs will improve Outcome: Progressing   Problem: Clinical Measurements: Goal: Ability to maintain clinical measurements within normal limits will improve Outcome: Progressing Goal: Will remain free from infection Outcome: Progressing Goal: Diagnostic test results will improve Outcome: Progressing Goal: Respiratory complications will improve Outcome: Progressing Goal: Cardiovascular complication will be avoided Outcome: Progressing   Problem: Activity: Goal: Risk for activity intolerance will decrease Outcome: Progressing   Problem: Nutrition: Goal: Adequate nutrition will be maintained Outcome: Progressing   Problem: Coping: Goal: Level of anxiety will decrease Outcome: Progressing   Problem: Elimination: Goal: Will not experience complications related to bowel motility Outcome: Progressing Goal: Will not experience  complications related to urinary retention Outcome: Progressing   Problem: Pain Managment: Goal: General experience of comfort will improve and/or be controlled Outcome: Progressing   Problem: Safety: Goal: Ability to remain free from injury will improve Outcome: Progressing   Problem: Skin Integrity: Goal: Risk for impaired skin integrity will decrease Outcome: Progressing

## 2023-08-16 NOTE — Assessment & Plan Note (Signed)
Blood cultures growing MRSA.  History of nasal abscess which grew MRSA few months ago which was treated with appropriately.  Currently having a scalp and toe wound, cultures were ordered.  ID is on board -Antibiotic switched to vancomycin -Echocardiogram ordered -Repeat blood cultures tomorrow

## 2023-08-16 NOTE — Assessment & Plan Note (Signed)
Blood pressure currently within goal.  Not on any antihypertensives at home. -Continue to monitor

## 2023-08-16 NOTE — Progress Notes (Signed)
Endo tool recommends to transition Pt.. Per provider Jon Billings, to evaluate until next chem. panel at 0130.  0212 Made provider aware of lab results. Per provider "continue endo tool"

## 2023-08-16 NOTE — Assessment & Plan Note (Signed)
Uncontrolled with hyperglycemia, A1c of 9.8. -Currently being managed with Endo tool

## 2023-08-16 NOTE — Inpatient Diabetes Management (Signed)
Inpatient Diabetes Program Recommendations  AACE/ADA: New Consensus Statement on Inpatient Glycemic Control (2015)  Target Ranges:  Prepandial:   less than 140 mg/dL      Peak postprandial:   less than 180 mg/dL (1-2 hours)      Critically ill patients:  140 - 180 mg/dL   Lab Results  Component Value Date   GLUCAP 201 (H) 08/16/2023   HGBA1C 9.8 (H) 08/16/2023    Review of Glycemic Control  Diabetes history: DM2 Outpatient Diabetes medications: Lantus 20 daily, Novolog 15 TID, Jardiance 25 daily Current orders for Inpatient glycemic control: IV insulin ready for transitioning to SQ insulin  Inpatient Diabetes Program Recommendations:    Consider transitioning to Semglee 15 units Q24H (give 1-2 hours prior to discontinuation of drip)  Consider Novolog 0-15 TID with meals and 0-5 HS  Add Novolog meal coverage - 3 units TID if eating > 50%  Continue to follow.  Diabetes Coordinator to see on 1/27.  Thank you. Ailene Ards, RD, LDN, CDCES Inpatient Diabetes Coordinator [phone]

## 2023-08-16 NOTE — Progress Notes (Signed)
Progress Note   Patient: Michaela Johnson VHQ:[id number] DOB: 11-14-1960 DOA: 08/15/2023     1 DOS: the patient was seen and examined on 08/16/2023   Brief hospital course: Ms. Dhalia Zingaro is a 63 year old female with history of insulin-dependent diabetes mellitus, GERD, atrial fibrillation on Xarelto, neuropathy, who presents emergency department for chief concerns of nausea vomiting since 6 AM on day of admission.  Vitals in the ED temperature 98.6, respiration 18, heart rate of 118, blood pressure 111/69, SpO2 100% on room air.  Serum sodium is 130, potassium 5.7, chloride 89, bicarb 10, nonfasting blood glucose 599,  BUN of 38, serum creatinine 1.27, EGFR 47, T. bili elevated at 2.0.  WBC 14.7, hemoglobin 14.3, platelets of 382.  troponin is 14.  COVID/influenza A/influenza B/RSV PCR were negative.  ED treatment: Sodium chloride 3 L bolus, insulin per endotool, ondansetron 4 mg IV one-time dose.  1/26: Vital stable, right foot imaging with soft tissue swelling of the first, third and fourth toes.  No radiographic evidence of osteomyelitis.  ESR was elevated at 45.  Ordered MRI to rule out osteomyelitis as she is high risk. Repeat labs with reopening And worsening beta-hydroxybutyrate acid so continuing Endo tool.  Blood cultures growing MRSA, discontinue cefepime and continuing vancomycin.  Echocardiogram ordered  Assessment and Plan: * DKA (diabetic ketoacidosis) (HCC) Patient presented with DKA, was not using insulin for the past few days.  Started on Endo tool along with bicarb infusion due to significant metabolic acidosis. Metabolic acidosis improved.  Worsening beta-hydroxybutyrate acid.  A1c of 9.8 -Stop bicarb infusion -Continue with Endo tool -Will transition once started showing improvement -Monitor BMP and beta-hydroxybutyrate acid  Uncontrolled type 2 diabetes mellitus with hyperglycemia, with long-term current use of insulin (HCC) Uncontrolled with hyperglycemia, A1c of  9.8. -Currently being managed with Endo tool  MRSA bacteremia Blood cultures growing MRSA.  History of nasal abscess which grew MRSA few months ago which was treated with appropriately.  Currently having a scalp and toe wound, cultures were ordered.  ID is on board -Antibiotic switched to vancomycin -Echocardiogram ordered -Repeat blood cultures tomorrow  Toe ulcer (HCC) Right foot x-ray, without any obvious osteo Elevated CRP and ESR. Blood cultures growing MRSA -Continue with vancomycin -MRI ordered to rule out osteo-  Leukocytosis Improving.  Growing MRSA in blood cultures. -Continue to monitor  PAF (paroxysmal atrial fibrillation) (HCC) Xarelto dosing decreased to 15 mg q. supper due to decreased creatinine clearance calculated at 35 mL/minute  Hypertension associated with diabetes (HCC) Blood pressure currently within goal.  Not on any antihypertensives at home. -Continue to monitor  AKI (acute kidney injury) (HCC) Secondary to DKA, high anion gap metabolic acidosis Status post aggressive fluid hydration per EDP AKI resolved  Cellulitis of scalp In setting of uncontrolled insulin-dependent diabetes mellitus Cultures were taken by ID -Currently on vancomycin  Chronic anticoagulation Home Xarelto dosing reduced to 15 mg daily with supper for creatinine clearance adjustment  Hyponatremia Pseudohyponatremia secondary to hyperosmolar hyperglycemia Serum corrected sodium is 142,   Elevated troponin Secondary to demand ischemia, flat curve, continue current medical treatment per above  Seizure [facility]) Not on any medication at home Seizure precaution Ativan 2 mg IV as needed as needed for anxiety, seizure,   Intractable nausea and vomiting Improved.  Patient wants to eat now. -Continue with supportive care  High anion gap metabolic acidosis Secondary to DKA Gap With mild reopening after closing. -Continue with Endo tool -Continue to monitor  Nicotine  dependence As needed  nicotine patch ordered for nicotine craving  Chronic alcohol use Ativan 2 mg IV as needed for anxiety, 2 doses ordered with instructions to administer as appropriate and then let provider know and if needed we can initiate CIWA precaution  Cellulitis of right toe With possible abscesses and osteomyelitis Right foot x-ray ordered on admission Cefepime and vancomycin per pharmacy   Subjective: Patient was seen and examined today.  Nausea and vomiting improved she wants to eat.  Physical Exam: Vitals:   08/16/23 0900 08/16/23 1000 08/16/23 1100 08/16/23 1200  BP: (!) 126/105 (!) 109/45 (!) 109/48 (!) 103/40  Pulse: (!) 120 (!) 106 (!) 106 (!) 107  Resp: (!) 22 15 15 20   Temp:      TempSrc:      SpO2: 100% 98% 99% 100%  Weight:      Height:       General.  Unkept lady, in no acute distress. Pulmonary.  Lungs clear bilaterally, normal respiratory effort. CV.  Regular rate and rhythm, no JVD, rub or murmur. Abdomen.  Soft, nontender, nondistended, BS positive. CNS.  Alert and oriented .  No focal neurologic deficit. Extremities.  No edema,  pulses intact and symmetrical.  Data Reviewed: Prior data reviewed  Family Communication: Discussed with patient  Disposition: Status is: Inpatient Remains inpatient appropriate because: Severity of illness  Planned Discharge Destination: Home  DVT prophylaxis.  Xarelto Time spent: 52 minutes  This record has been created using Conservation officer, historic buildings. Errors have been sought and corrected,but may not always be located. Such creation errors do not reflect on the standard of care.   Author: Arnetha Courser, MD 08/16/2023 1:34 PM  For on call review [email or web address].

## 2023-08-16 NOTE — Progress Notes (Signed)
Echocardiogram 2D Echocardiogram has been performed.  Michaela Johnson 08/16/2023, 4:22 PM

## 2023-08-16 NOTE — Progress Notes (Signed)
PHARMACY - PHYSICIAN COMMUNICATION CRITICAL VALUE ALERT - BLOOD CULTURE IDENTIFICATION (BCID)  Michaela Johnson is an 63 y.o. female who presented to [facility] on 08/15/2023 with a chief complaint of intractable nausea.   Assessment: 1/3 bottles GPCs; BCID detected MRSA  Name of physician (or Provider) Contacted: Dr. Nelson Chimes  Current antibiotics: vancomycin & cefepime  Changes to prescribed antibiotics recommended:  Recommendations accepted by provider - discontinue cefepime; continue vancomycin  Results for orders placed or performed during the hospital encounter of 08/15/23  Blood Culture ID Panel (Reflexed) (Collected: 08/15/2023  4:04 PM)  Result Value Ref Range   Enterococcus faecalis NOT DETECTED NOT DETECTED   Enterococcus Faecium NOT DETECTED NOT DETECTED   Listeria monocytogenes NOT DETECTED NOT DETECTED   Staphylococcus species DETECTED (A) NOT DETECTED   Staphylococcus aureus (BCID) DETECTED (A) NOT DETECTED   Staphylococcus epidermidis NOT DETECTED NOT DETECTED   Staphylococcus lugdunensis NOT DETECTED NOT DETECTED   Streptococcus species NOT DETECTED NOT DETECTED   Streptococcus agalactiae NOT DETECTED NOT DETECTED   Streptococcus pneumoniae NOT DETECTED NOT DETECTED   Streptococcus pyogenes NOT DETECTED NOT DETECTED   A.calcoaceticus-baumannii NOT DETECTED NOT DETECTED   Bacteroides fragilis NOT DETECTED NOT DETECTED   Enterobacterales NOT DETECTED NOT DETECTED   Enterobacter cloacae complex NOT DETECTED NOT DETECTED   Escherichia coli NOT DETECTED NOT DETECTED   Klebsiella aerogenes NOT DETECTED NOT DETECTED   Klebsiella oxytoca NOT DETECTED NOT DETECTED   Klebsiella pneumoniae NOT DETECTED NOT DETECTED   Proteus species NOT DETECTED NOT DETECTED   Salmonella species NOT DETECTED NOT DETECTED   Serratia marcescens NOT DETECTED NOT DETECTED   Haemophilus influenzae NOT DETECTED NOT DETECTED   Neisseria meningitidis NOT DETECTED NOT DETECTED   Pseudomonas aeruginosa  NOT DETECTED NOT DETECTED   Stenotrophomonas maltophilia NOT DETECTED NOT DETECTED   Candida albicans NOT DETECTED NOT DETECTED   Candida auris NOT DETECTED NOT DETECTED   Candida glabrata NOT DETECTED NOT DETECTED   Candida krusei NOT DETECTED NOT DETECTED   Candida parapsilosis NOT DETECTED NOT DETECTED   Candida tropicalis NOT DETECTED NOT DETECTED   Cryptococcus neoformans/gattii NOT DETECTED NOT DETECTED   Meth resistant mecA/C and MREJ DETECTED (A) NOT DETECTED   Littie Deeds, PharmD Pharmacy Resident  08/16/2023 11:34 AM

## 2023-08-17 DIAGNOSIS — F17200 Nicotine dependence, unspecified, uncomplicated: Secondary | ICD-10-CM

## 2023-08-17 DIAGNOSIS — N179 Acute kidney failure, unspecified: Secondary | ICD-10-CM

## 2023-08-17 DIAGNOSIS — L089 Local infection of the skin and subcutaneous tissue, unspecified: Secondary | ICD-10-CM

## 2023-08-17 DIAGNOSIS — R7989 Other specified abnormal findings of blood chemistry: Secondary | ICD-10-CM

## 2023-08-17 DIAGNOSIS — R7881 Bacteremia: Secondary | ICD-10-CM

## 2023-08-17 DIAGNOSIS — F1090 Alcohol use, unspecified, uncomplicated: Secondary | ICD-10-CM

## 2023-08-17 DIAGNOSIS — E1110 Type 2 diabetes mellitus with ketoacidosis without coma: Secondary | ICD-10-CM | POA: Diagnosis not present

## 2023-08-17 DIAGNOSIS — D72829 Elevated white blood cell count, unspecified: Secondary | ICD-10-CM | POA: Diagnosis not present

## 2023-08-17 DIAGNOSIS — R112 Nausea with vomiting, unspecified: Secondary | ICD-10-CM

## 2023-08-17 DIAGNOSIS — B9562 Methicillin resistant Staphylococcus aureus infection as the cause of diseases classified elsewhere: Secondary | ICD-10-CM

## 2023-08-17 DIAGNOSIS — E8729 Other acidosis: Secondary | ICD-10-CM

## 2023-08-17 DIAGNOSIS — E1165 Type 2 diabetes mellitus with hyperglycemia: Secondary | ICD-10-CM | POA: Diagnosis not present

## 2023-08-17 DIAGNOSIS — Z794 Long term (current) use of insulin: Secondary | ICD-10-CM

## 2023-08-17 DIAGNOSIS — I480 Paroxysmal atrial fibrillation: Secondary | ICD-10-CM

## 2023-08-17 DIAGNOSIS — E871 Hypo-osmolality and hyponatremia: Secondary | ICD-10-CM

## 2023-08-17 LAB — CBC
HCT: 33.1 % — ABNORMAL LOW (ref 36.0–46.0)
Hemoglobin: 11.2 g/dL — ABNORMAL LOW (ref 12.0–15.0)
MCH: 31.6 pg (ref 26.0–34.0)
MCHC: 33.8 g/dL (ref 30.0–36.0)
MCV: 93.5 fL (ref 80.0–100.0)
Platelets: 328 10*3/uL (ref 150–400)
RBC: 3.54 MIL/uL — ABNORMAL LOW (ref 3.87–5.11)
RDW: 13.1 % (ref 11.5–15.5)
WBC: 12.3 10*3/uL — ABNORMAL HIGH (ref 4.0–10.5)
nRBC: 0.2 % (ref 0.0–0.2)

## 2023-08-17 LAB — ECHOCARDIOGRAM COMPLETE
AR max vel: 1.8 cm2
AV Peak grad: 8.5 mm[Hg]
Ao pk vel: 1.46 m/s
Area-P 1/2: 4.96 cm2
Est EF: 55
Height: 62 in
S' Lateral: 2.6 cm
Weight: 1844.81 [oz_av]

## 2023-08-17 LAB — GLUCOSE, CAPILLARY
Glucose-Capillary: 141 mg/dL — ABNORMAL HIGH (ref 70–99)
Glucose-Capillary: 150 mg/dL — ABNORMAL HIGH (ref 70–99)
Glucose-Capillary: 157 mg/dL — ABNORMAL HIGH (ref 70–99)
Glucose-Capillary: 157 mg/dL — ABNORMAL HIGH (ref 70–99)
Glucose-Capillary: 164 mg/dL — ABNORMAL HIGH (ref 70–99)
Glucose-Capillary: 169 mg/dL — ABNORMAL HIGH (ref 70–99)
Glucose-Capillary: 171 mg/dL — ABNORMAL HIGH (ref 70–99)
Glucose-Capillary: 178 mg/dL — ABNORMAL HIGH (ref 70–99)
Glucose-Capillary: 266 mg/dL — ABNORMAL HIGH (ref 70–99)
Glucose-Capillary: 306 mg/dL — ABNORMAL HIGH (ref 70–99)
Glucose-Capillary: 386 mg/dL — ABNORMAL HIGH (ref 70–99)

## 2023-08-17 LAB — BASIC METABOLIC PANEL
Anion gap: 10 (ref 5–15)
BUN: 15 mg/dL (ref 8–23)
CO2: 25 mmol/L (ref 22–32)
Calcium: 8.1 mg/dL — ABNORMAL LOW (ref 8.9–10.3)
Chloride: 100 mmol/L (ref 98–111)
Creatinine, Ser: 0.71 mg/dL (ref 0.44–1.00)
GFR, Estimated: >60 mL/min (ref >60)
Glucose, Bld: 163 mg/dL — ABNORMAL HIGH (ref 70–99)
Potassium: 3.6 mmol/L (ref 3.5–5.1)
Sodium: 135 mmol/L (ref 135–145)

## 2023-08-17 LAB — URINE DRUG SCREEN, QUALITATIVE (ARMC ONLY)
Amphetamines, Ur Screen: NOT DETECTED
Barbiturates, Ur Screen: NOT DETECTED
Benzodiazepine, Ur Scrn: NOT DETECTED
Cannabinoid 50 Ng, Ur ~~LOC~~: NOT DETECTED
Cocaine Metabolite,Ur ~~LOC~~: POSITIVE — AB
MDMA (Ecstasy)Ur Screen: NOT DETECTED
Methadone Scn, Ur: NOT DETECTED
Opiate, Ur Screen: NOT DETECTED
Phencyclidine (PCP) Ur S: NOT DETECTED
Tricyclic, Ur Screen: NOT DETECTED

## 2023-08-17 LAB — BETA-HYDROXYBUTYRIC ACID
Beta-Hydroxybutyric Acid: 2.77 mmol/L — ABNORMAL HIGH (ref 0.05–0.27)
Beta-Hydroxybutyric Acid: 1.99 mmol/L — ABNORMAL HIGH (ref 0.05–0.27)

## 2023-08-17 MED ORDER — INSULIN GLARGINE-YFGN 100 UNIT/ML ~~LOC~~ SOLN
10.0000 [IU] | Freq: Two times a day (BID) | SUBCUTANEOUS | Status: DC
  Administered 2023-08-17 – 2023-08-18 (×3): 10 [IU] via SUBCUTANEOUS
  Filled 2023-08-17 (×6): qty 0.1

## 2023-08-17 MED ORDER — INSULIN ASPART 100 UNIT/ML IJ SOLN
4.0000 [IU] | Freq: Three times a day (TID) | INTRAMUSCULAR | Status: DC
  Administered 2023-08-17 – 2023-08-18 (×3): 4 [IU] via SUBCUTANEOUS
  Filled 2023-08-17 (×3): qty 1

## 2023-08-17 MED ORDER — INSULIN ASPART 100 UNIT/ML IJ SOLN
0.0000 [IU] | Freq: Three times a day (TID) | INTRAMUSCULAR | Status: DC
  Administered 2023-08-17: 8 [IU] via SUBCUTANEOUS
  Administered 2023-08-17: 15 [IU] via SUBCUTANEOUS
  Administered 2023-08-18: 8 [IU] via SUBCUTANEOUS
  Administered 2023-08-18: 3 [IU] via SUBCUTANEOUS
  Administered 2023-08-18: 11 [IU] via SUBCUTANEOUS
  Administered 2023-08-19: 15 [IU] via SUBCUTANEOUS
  Administered 2023-08-19: 3 [IU] via SUBCUTANEOUS
  Filled 2023-08-17 (×7): qty 1

## 2023-08-17 MED ORDER — INSULIN ASPART 100 UNIT/ML IJ SOLN
0.0000 [IU] | Freq: Every day | INTRAMUSCULAR | Status: DC
  Administered 2023-08-17: 4 [IU] via SUBCUTANEOUS
  Administered 2023-08-18: 3 [IU] via SUBCUTANEOUS
  Filled 2023-08-17 (×2): qty 1

## 2023-08-17 MED ORDER — RIVAROXABAN 20 MG PO TABS
20.0000 mg | ORAL_TABLET | Freq: Every day | ORAL | Status: DC
  Administered 2023-08-17 – 2023-08-18 (×2): 20 mg via ORAL
  Filled 2023-08-17 (×4): qty 1

## 2023-08-17 NOTE — Progress Notes (Signed)
   [facility] has been requested to perform a transesophageal echocardiogram on Michaela Johnson for bacteremia.  After careful review of history and examination, the risks and benefits of transesophageal echocardiogram have been explained including risks of esophageal damage, perforation (1:10,000 risk), bleeding, pharyngeal hematoma as well as other potential complications associated with conscious sedation including aspiration, arrhythmia, respiratory failure and death. Alternatives to treatment were discussed, questions were answered. Patient is willing to proceed.  We will tentatively plan on TEE on Wednesday 1/29 w/ Dr. Mariah Milling.  Time TBD.  Nicolasa Ducking, NP  08/17/2023 4:17 PM

## 2023-08-17 NOTE — Plan of Care (Signed)
  Problem: Education: Goal: Ability to describe self-care measures that may prevent or decrease complications (Diabetes Survival Skills Education) will improve Outcome: Not Progressing   Problem: Coping: Goal: Ability to adjust to condition or change in health will improve Outcome: Progressing   Problem: Health Behavior/Discharge Planning: Goal: Ability to identify and utilize available resources and services will improve Outcome: Not Progressing

## 2023-08-17 NOTE — Progress Notes (Signed)
Patient was requesting food. CBG dropped to 128. Notified NP Jawo. Received orders to start D5 with LR and to repeat labs. No diet order at this time. Patient is allowed to have water.

## 2023-08-17 NOTE — Assessment & Plan Note (Signed)
Slowly improving.  Growing MRSA in blood cultures. -Continue to monitor

## 2023-08-17 NOTE — Assessment & Plan Note (Signed)
Blood cultures growing MRSA.  History of nasal abscess which grew MRSA few months ago which was treated with appropriately.   ID is on board Scalp and foot ulcer cultures are also growing MRSA Echocardiogram with no mention of vegetation will need TEE -Continue with vancomycin -Cardiology consulted for TEE -Repeat blood cultures tomorrow

## 2023-08-17 NOTE — Assessment & Plan Note (Signed)
Uncontrolled with hyperglycemia, A1c of 9.8. -Semeglee 10 units twice daily -6 units of NovoLog with meal -SSI

## 2023-08-17 NOTE — Assessment & Plan Note (Signed)
In setting of uncontrolled insulin-dependent diabetes mellitus Cultures are growing MRSA -Currently on vancomycin

## 2023-08-17 NOTE — Assessment & Plan Note (Signed)
Secondary to DKA, high anion gap metabolic acidosis Status post aggressive fluid hydration per EDP AKI resolved

## 2023-08-17 NOTE — Progress Notes (Signed)
   08/17/23 1040  Spiritual Encounters  Type of Visit Initial  Care provided to: Patient  Referral source Chaplain assessment  Reason for visit Routine spiritual support  OnCall Visit No  Spiritual Framework  Presenting Themes Meaning/purpose/sources of inspiration;Values and beliefs;Other (comment) (Pt asked for prayer)  Interventions  Spiritual Care Interventions Made Established relationship of care and support;Compassionate presence;Reflective listening;Explored values/beliefs/practices/strengths;Prayer  Intervention Outcomes  Outcomes Connection to spiritual care;Awareness around self/spiritual resourses;Awareness of support;Other (comment) (Refreshed cold cloth for her eye; gave her a blanket from her bed (in chair))

## 2023-08-17 NOTE — Assessment & Plan Note (Signed)
Right foot x-ray, without any obvious osteo, MRI with concern of early osteomyelitis Elevated CRP and ESR. Blood cultures growing MRSA -Continue with vancomycin -Podiatry consult-going to the OR tomorrow for right third and fourth toe amputation

## 2023-08-17 NOTE — Assessment & Plan Note (Signed)
Secondary to hyperosmolar hyperglycemia Serum corrected sodium is 142, [place] 1999

## 2023-08-17 NOTE — Progress Notes (Signed)
Progress Note   Patient: Michaela Johnson ZOX:[id number] DOB: Oct 21, 1960 DOA: 08/15/2023     2 DOS: the patient was seen and examined on 08/17/2023   Brief hospital course: Ms. Michaela Johnson is a 62 year old female with history of insulin-dependent diabetes mellitus, GERD, atrial fibrillation on Xarelto, neuropathy, who presents emergency department for chief concerns of nausea vomiting since 6 AM on day of admission.  Vitals in the ED temperature 98.6, respiration 18, heart rate of 118, blood pressure 111/69, SpO2 100% on room air.  Serum sodium is 130, potassium 5.7, chloride 89, bicarb 10, nonfasting blood glucose 599,  BUN of 38, serum creatinine 1.27, EGFR 47, T. bili elevated at 2.0.  WBC 14.7, hemoglobin 14.3, platelets of 382.  troponin is 14.  COVID/influenza A/influenza B/RSV PCR were negative.  ED treatment: Sodium chloride 3 L bolus, insulin per endotool, ondansetron 4 mg IV one-time dose.  1/26: Vital stable, right foot imaging with soft tissue swelling of the first, third and fourth toes.  No radiographic evidence of osteomyelitis.  ESR was elevated at 45.  Ordered MRI to rule out osteomyelitis as she is high risk. Repeat labs with reopening And worsening beta-hydroxybutyrate acid so continuing Endo tool.  Blood cultures growing MRSA, discontinue cefepime and continuing vancomycin.  Echocardiogram ordered  1/27: Her wound culture from scalp and foot are also growing MRSA. Echocardiogram with EF of 55%, grade 1 diastolic dysfunction, also showed some regional wall motion abnormalities but no mention of any vegetation, cardiology was consulted for TEE.  Foot x-ray with concern of early osteomyelitis, consulting podiatry. DKA resolved-patient was started on basal and SSI.  Assessment and Plan: * DKA (diabetic ketoacidosis) (HCC) Patient presented with DKA, was not using insulin for the past few days.  Started on Endo tool along with bicarb infusion due to significant metabolic  acidosis. DKA resolved and she was started on basal and short-acting  A1c of 9.8 -Started on Semglee 10 units twice daily -4 units of NovoLog with meal -SSI  Uncontrolled type 2 diabetes mellitus with hyperglycemia, with long-term current use of insulin (HCC) Uncontrolled with hyperglycemia, A1c of 9.8. -Semeglee 10 units twice daily -4 units of NovoLog with meal -SSI  MRSA bacteremia Blood cultures growing MRSA.  History of nasal abscess which grew MRSA few months ago which was treated with appropriately.   ID is on board Scalp and foot ulcer cultures are also growing MRSA Echocardiogram with no mention of vegetation will need TEE -Continue with vancomycin -Cardiology consulted for TEE -Repeat blood cultures tomorrow  Toe ulcer (HCC) Right foot x-ray, without any obvious osteo, MRI with concern of early osteomyelitis Elevated CRP and ESR. Blood cultures growing MRSA -Continue with vancomycin -Podiatry consult  Leukocytosis Slowly improving.  Growing MRSA in blood cultures. -Continue to monitor  PAF (paroxysmal atrial fibrillation) (HCC) Xarelto dosing decreased to 15 mg q. supper due to decreased creatinine clearance calculated at 35 mL/minute  Hypertension associated with diabetes (HCC) Blood pressure currently within goal.  Not on any antihypertensives at home. -Continue to monitor  AKI (acute kidney injury) (HCC) Secondary to DKA, high anion gap metabolic acidosis Status post aggressive fluid hydration per EDP AKI resolved  Cellulitis of scalp In setting of uncontrolled insulin-dependent diabetes mellitus Cultures are growing MRSA -Currently on vancomycin  Chronic anticoagulation Home Xarelto dosing reduced to 15 mg daily with supper for creatinine clearance adjustment  Hyponatremia Pseudohyponatremia secondary to hyperosmolar hyperglycemia Serum corrected sodium is 142,   Elevated troponin Secondary to demand ischemia, flat  curve, continue current medical  treatment per above  Seizure [facility]) Not on any medication at home Seizure precaution Ativan 2 mg IV as needed as needed for anxiety, seizure,   Intractable nausea and vomiting Improved.  Patient wants to eat now. -Continue with supportive care  High anion gap metabolic acidosis Secondary to DKA Gap With mild reopening after closing. -Continue with Endo tool -Continue to monitor  Nicotine dependence As needed nicotine patch ordered for nicotine craving  Chronic alcohol use Ativan 2 mg IV as needed for anxiety, 2 doses ordered with instructions to administer as appropriate and then let provider know and if needed we can initiate CIWA precaution  Cellulitis of right toe With possible abscesses and osteomyelitis Right foot x-ray ordered on admission Cefepime and vancomycin per pharmacy   Subjective: Patient was sitting in chair when seen today.  She was keeps saying that there are dust mites crawling everywhere including my eyes.  Physical Exam: Vitals:   08/17/23 1000 08/17/23 1100 08/17/23 1200 08/17/23 1336  BP: (!) 134/96 (!) 152/70 (!) 147/81 (!) 152/72  Pulse: (!) 106 (!) 102 93 (!) 104  Resp: 17 17 20 18   Temp:   98.4 F (36.9 C) 98.1 F (36.7 C)  TempSrc:   Oral   SpO2: 99% 99% 100% 100%  Weight:      Height:       General.  Frail and unkept lady, in no acute distress. Pulmonary.  Lungs clear bilaterally, normal respiratory effort. CV.  Regular rate and rhythm, no JVD, rub or murmur. Abdomen.  Soft, nontender, nondistended, BS positive. CNS.  Alert and oriented .  No focal neurologic deficit. Extremities.  No edema, no cyanosis, pulses intact and symmetrical.  Data Reviewed: Prior data reviewed  Family Communication: Discussed with patient  Disposition: Status is: Inpatient Remains inpatient appropriate because: Severity of illness  Planned Discharge Destination: Home  DVT prophylaxis.  Xarelto Time spent: 50 minutes  This record has been created  using Conservation officer, historic buildings. Errors have been sought and corrected,but may not always be located. Such creation errors do not reflect on the standard of care.   Author: Arnetha Courser, MD 08/17/2023 3:18 PM  For on call review [email or web address].

## 2023-08-17 NOTE — Assessment & Plan Note (Signed)
Patient presented with DKA, was not using insulin for the past few days.  Started on Endo tool along with bicarb infusion due to significant metabolic acidosis. DKA resolved and she was started on basal and short-acting  A1c of 9.8 -Started on Semglee 10 units twice daily -4 units of NovoLog with meal -SSI

## 2023-08-17 NOTE — Inpatient Diabetes Management (Addendum)
Inpatient Diabetes Program Recommendations  AACE/ADA: New Consensus Statement on Inpatient Glycemic Control (2015)  Target Ranges:  Prepandial:   less than 140 mg/dL      Peak postprandial:   less than 180 mg/dL (1-2 hours)      Critically ill patients:  140 - 180 mg/dL    Latest Reference Range & Units 08/17/23 00:02 08/17/23 01:03 08/17/23 03:00 08/17/23 04:15 08/17/23 05:01  Glucose-Capillary 70 - 99 mg/dL 601 (H) 093 (H) 235 (H) 169 (H) 178 (H)    Latest Reference Range & Units 08/16/23 13:50 08/16/23 14:54 08/16/23 15:47 08/16/23 16:48 08/16/23 17:49 08/16/23 18:51 08/16/23 19:54 08/16/23 21:05 08/16/23 21:59 08/16/23 23:05  Glucose-Capillary 70 - 99 mg/dL 573 (H) 220 (H) 254 (H) 137 (H) 141 (H) 137 (H) 138 (H) 132 (H) 128 (H) 150 (H)    Latest Reference Range & Units 08/15/23 12:31  CO2 22 - 32 mmol/L 10 (L)  Glucose 70 - 99 mg/dL 270 (HH)  BUN 8 - 23 mg/dL 38 (H)  Creatinine 6.23 - 1.00 mg/dL 7.62 (H)  Calcium 8.9 - 10.3 mg/dL 9.6  Anion gap 5 - 15  31 (H)    Latest Reference Range & Units 08/15/23 13:16 08/15/23 16:04 08/15/23 21:41 08/16/23 03:37 08/16/23 08:57 08/16/23 13:34 08/16/23 17:51 08/17/23 01:05  Beta-Hydroxybutyric Acid 0.05 - 0.27 mmol/L >8.00 (H) 7.85 (H) 2.45 (H) 2.58 (H) 5.15 (H) 3.52 (H) 3.70 (H) 2.77 (H)   Review of Glycemic Control  Diabetes history: DM2 Outpatient Diabetes medications: Lantus 40 units daily, Novolog 4 units TID with meals, Ozempic 2 mg Qweek (Sunday; recently started and only took for 2 weeks then stopped due to hypoglycemia for 3 days following injection), Jardiance 25 mg daily Current orders for Inpatient glycemic control: IV insulin  Inpatient Diabetes Program Recommendations:    Insulin: IV insulin should be continued until acidosis has completely resolved. Once acidosis resolved and provider is ready to transition from IV to SQ insulin, please consider ordering Semglee 15 units Q24H, CBGs Q4H, and Novolog 0-9 units Q4H.  Outpatient  DM: At time of discharge please provide Rx for Lantus pens [phone]), Novolog pens (#[id number]), insulin pen needles (#[id number]), Jardiance 25 mg daily, glucose test strips (#[id number]).  Patient states she also needs prescriptions for her omeprazole and gabapentin.  Would recommend to have Rx filled through outpatient [facility] pharmacy and have all meds and supplies delivered to patient's room prior to discharge.  NOTE: Patient with DM2 hx admitted with DKA, toe ulcer, chronic alcohol use, leukocytosis, PAF, elevated troponin, AKI, cellulitis of right toe, cellulitis of scalp, intractiable nausea and vomiting, metabolic acidosis, hyponatremia, and seizure. Per H&P on 1/25 by Dr. Sedalia Muta, "She has been out of insulin for several days. She states her last insulin dose was Wednesday. She reports she called her medication in but they are on back log." Will plan to follow up with patient today.  Addendum 08/17/23-Spoke to patient at bedside. Patient reports that she is taking Lantus 40 units daily, Novolog 4 units TID with meals, Ozempic 2 mg Qweek (Sunday; recently started and only took for 2 weeks then stopped due to hypoglycemia for 3 days following injection), and Jardiance 25 mg daily for DM control. Patient states that she ran out of insulin on Wednesday. She states she goes to [facility] for PCP and she gets medications filled at [facility]. Patient states that she called [facility] on Wednesday and she was told that her insulin was back  logged/backordered.  Patient reports that she was told she could send the prescription to a different pharmacy but she did not do that because she did not have the money to pay her copay for the medications. She reports that [facility] will usually bill her for the cost of her copay.  Patient reports that she does finger sticks for glucose monitoring and her glucose is usually in the 200's when she has DM medications. Discussed with patient that  since she was recently started on Ozempic and experienced hypoglycemia, she likely needs to have Lantus dose decreased. Patient states she plans to talk with her provider at [facility] about hypoglycemia with the Ozempic before she resumes it. Stressed to patient to be sure to follow up and stay in contact with provider there for assistance with DM management. Discussed glucose and A1C goals. Discussed importance of checking CBGs and maintaining good CBG control to prevent long-term and short-term complications. Stressed to the patient the importance of improving glycemic control to prevent further complications from uncontrolled diabetes. Patient states she also needs prescriptions for her omeprazole and gabapentin.  Patient verbalized understanding of information discussed and reports no further questions at this time related to diabetes. Sent chat message to Jaclynn Guarneri to ask if outpatient [facility] pharmacy can arrange to fill Rx for DM medications at discharge and deliver to patient in room.  Thanks, Orlando Penner, RN, MSN, CDCES Diabetes Coordinator Inpatient Diabetes Program [phone] (Team Pager from 8am to 5pm)

## 2023-08-17 NOTE — Consult Note (Signed)
NAME: Michaela Johnson  DOB: 1961-05-04  MRN: [id number]  Date/Time: 08/17/2023 10:39 AM  REQUESTING PROVIDER: Dr.Amin Subjective:  REASON FOR CONSULT: MRSA bacteremia ? Michaela Johnson is a 63 y.o. with a history of insulin-dependent diabetes mellitus with DKA in the past atrial fibrillation on Xarelto, neuropathy, ankle fusion on the right side, fracture right hip for which she had ORIF in June 2023, recent hospitalization for nasal abscess which was drained and it was MRSA and treated with appropriate antibiotics for a week, presents to the hospital on 08/15/2023 with nausea and vomiting on the day of presentation.  She had not eaten insulin for several days because of being out of it.  She did not have any fever.  She also had ulcers on her right toes which she noted a few days ago.. In the ED vitals were BP of 89/43, temp 98.6, pulse 115, respiratory rate 18, and oxygen sats of 96%. Labs revealed a WBC of 14.7, hemoglobin 14.3, platelet 382, creatinine 1.27, sodium 130, potassium 5.7, CO2 10, glucose of 599.  She was diagnosed with DKA and admitted to the stepdown and ICU managed with fluids and insulin.  She was also started on vancomycin and cefepime after blood cultures were sent.  The blood culture came positive for MRSA and I am seeing the patient for the same.  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   Constipation    Depression    Diabetes mellitus without complication (HCC)    Dysrhythmia     PAF   GERD (gastroesophageal reflux disease)    Hypertension    Neuropathy    Nicotine dependence 07/31/2020   PAF (paroxysmal atrial fibrillation) (HCC)    Panic attacks    Pneumonia     Tobacco use    Vitamin D deficiency     Past Surgical History:  Procedure Laterality Date   ANKLE FUSION Right 07/30/2020   Procedure: ANKLE FUSION;  Surgeon: Myrene Galas, MD;  Location: [facility] OR;  Service: Orthopedics;  Laterality: Right;   APPLICATION OF WOUND VAAPPLICATION OF WOUND VAC (Right Ankle)C (Right Ankle)  07/31/2020   APPLICATION OF WOUND VAC Right 07/30/2020   Procedure: APPLICATION OF WOUND VAC;  Surgeon: Myrene Galas, MD;  Location: [facility] OR;  Service: Orthopedics;  Laterality: Right;   HARDWARE REMOVAL Right 07/30/2020   Procedure: HARDWARE REMOVAL RIGHT ANKLE;  Surgeon: Myrene Galas, MD;  Location: [facility] OR;  Service: Orthopedics;  Laterality: Right;   HARDWARE REMOVAL RIGHT ANKLE (Right Ankle  07/31/2020   INCISION AND DRAINAGE ABSCESS N/A 06/15/2023   Procedure: INCISION AND DRAINAGE NASAL ABSCESS;  Surgeon: Linus Salmons, MD;  Location: [facility] ORS;  Service: ENT;  Laterality: N/A;   INTRAMEDULLARY (IM) NAIL INTERTROCHANTERIC Right 01/04/2022   Procedure: INTRAMEDULLARY (IM) NAIL INTERTROCHANTRIC;  Surgeon: Christena Flake, MD;  Location: [facility] ORS;  Service: Orthopedics;  Laterality: Right;   ORIF ANKLE FRACTURE Right 07/04/2020   Procedure: OPEN REDUCTION INTERNAL FIXATION (ORIF) ANKLE FRACTURE;  Surgeon: Lyndle Herrlich, MD;  Location: [facility] ORS;  Service: Orthopedics;  Laterality: Right;    Social History   Socioeconomic History   Marital status:  Married    Spouse name: Not on file   Number of children: Not on file   Years of education: Not on file   Highest education level: Not on file  Occupational History   Not on file  Tobacco Use   Smoking status: Every Day    Current packs/day: 1.00    Average packs/day: 1 pack/day for 41.0 years (41.0 ttl pk-yrs)    Types: Cigarettes, E-cigarettes, Cigars   Smokeless tobacco: Never  Vaping Use   Vaping status: Never Used  Substance and Sexual Activity   Alcohol use: Not Currently    Alcohol/week: 6.0 standard drinks of  alcohol    Types: 6 Standard drinks or equivalent per week    Comment: last use yesterday x 1 beer   Drug use: Not Currently    Frequency: 7.0 times per week    Types: Marijuana    Comment: 3 days every week   Sexual activity: Yes    Partners: Male  Other Topics Concern   Not on file  Social History Narrative   Not on file   Social Drivers of Health   Financial Resource Strain: Not on file  Food Insecurity: No Food Insecurity (08/16/2023)   Hunger Vital Sign    Worried About Running Out of Food in the Last Year: Never true    Ran Out of Food in the Last Year: Never true  Transportation Needs: No Transportation Needs (08/16/2023)   PRAPARE - Administrator, Civil Service (Medical): No    Lack of Transportation (Non-Medical): No  Physical Activity: Not on file  Stress: Not on file  Social Connections: Not on file  Intimate Partner Violence: Not At Risk (08/16/2023)   Humiliation, Afraid, Rape, and Kick questionnaire    Fear of Current or Ex-Partner: No    Emotionally Abused: No    Physically Abused: No    Sexually Abused: No    Family History  Problem Relation Age of Onset   Diabetes Mother    CAD Father    Heart Problems Father    Heart failure Father    Diabetes Paternal Aunt    Allergies  Allergen Reactions   Glipizide     Other reaction(s): Other (See Comments)   Metformin Diarrhea   Codeine Itching   Levaquin [Levofloxacin In D5w] Itching   I? Current Facility-Administered Medications  Medication Dose Route Frequency Provider Last Rate Last Admin   acetaminophen (TYLENOL) tablet 650 mg  650 mg Oral Q6H PRN [name], [name], DO   650 mg at 08/16/23 2226   Or   acetaminophen (TYLENOL) suppository 650 mg  650 mg Rectal Q4H PRN [name], [name], DO       Chlorhexidine Gluconate Cloth 2 % PADS 6 each  6 each Topical Q0600 [name], [name], DO   6 each at 08/16/23 2228   dextrose 5 % in lactated ringers infusion   Intravenous Continuous Jawo, Modou L, NP 125 mL/hr at  08/17/23 1027 Infusion Verify at 08/17/23 1027   dextrose 50 % solution 0-50 mL  0-50 mL Intravenous PRN [name], [name], DO       gabapentin (NEURONTIN) capsule 900 mg  900 mg Oral QHS [name], [name], DO   900 mg at 08/16/23 2226   hydrALAZINE (APRESOLINE) injection 5 mg  5 mg Intravenous Q6H PRN [name], [name], DO       insulin aspart (novoLOG) injection 0-15 Units  0-15 Units Subcutaneous TID WC Amin, Sumayya,  MD       insulin aspart (novoLOG) injection 0-5 Units  0-5 Units Subcutaneous QHS Arnetha Courser, MD       insulin aspart (novoLOG) injection 4 Units  4 Units Subcutaneous TID WC Arnetha Courser, MD       insulin glargine-yfgn (SEMGLEE) injection 10 Units  10 Units Subcutaneous BID Arnetha Courser, MD   10 Units at 08/17/23 0917   insulin regular, human (MYXREDLIN) 100 units/ 100 mL infusion   Intravenous Continuous [name], [name], DO 0.4 mL/hr at 08/17/23 1027 0.4 Units/hr at 08/17/23 1027   LORazepam (ATIVAN) injection 2 mg  2 mg Intravenous PRN [name], [name], DO   2 mg at 08/16/23 2226   metoprolol tartrate (LOPRESSOR) injection 5 mg  5 mg Intravenous Q4H PRN [name], [name], DO       mupirocin ointment (BACTROBAN) 2 % 1 Application  1 Application Nasal BID [name], [name], DO   1 Application at 08/17/23 4098   nicotine (NICODERM CQ - dosed in mg/24 hours) patch 21 mg  21 mg Transdermal Daily PRN [name], [name], DO       ondansetron (ZOFRAN) injection 4 mg  4 mg Intravenous Q6H PRN [name], [name], DO   4 mg at 08/16/23 2033   pantoprazole (PROTONIX) EC tablet 40 mg  40 mg Oral Daily [name], [name], DO   40 mg at 08/17/23 1191   Rivaroxaban (XARELTO) tablet 15 mg  15 mg Oral Q supper [name], [name], DO   15 mg at 08/16/23 1647   vancomycin (VANCOREADY) IVPB 500 mg/100 mL  500 mg Intravenous Q12H Barrie Folk, RPH 100 mL/hr at 08/17/23 0136 500 mg at 08/17/23 0136     Abtx:  Anti-infectives (From admission, onward)    Start     Dose/Rate Route Frequency Ordered Stop   08/16/23 1700  vancomycin (VANCOREADY) IVPB 500 mg/100 mL   Status:  Discontinued        500 mg 100 mL/hr over 60 Minutes Intravenous Every 24 hours 08/15/23 1551 08/16/23 1301   08/16/23 1400  vancomycin (VANCOREADY) IVPB 500 mg/100 mL  Status:  Discontinued        500 mg 100 mL/hr over 60 Minutes Intravenous Every 12 hours 08/16/23 1301 08/16/23 1302   08/16/23 1400  vancomycin (VANCOREADY) IVPB 500 mg/100 mL        500 mg 100 mL/hr over 60 Minutes Intravenous Every 12 hours 08/16/23 1302     08/16/23 0400  ceFEPIme (MAXIPIME) 2 g in sodium chloride 0.9 % 100 mL IVPB  Status:  Discontinued        2 g 200 mL/hr over 30 Minutes Intravenous Every 12 hours 08/15/23 1549 08/16/23 1135   08/15/23 1530  ceFEPIme (MAXIPIME) 2 g in sodium chloride 0.9 % 100 mL IVPB        2 g 200 mL/hr over 30 Minutes Intravenous  Once 08/15/23 1517 08/15/23 1658   08/15/23 1530  vancomycin (VANCOCIN) IVPB 1000 mg/200 mL premix        1,000 mg 200 mL/hr over 60 Minutes Intravenous  Once 08/15/23 1517 08/16/23 0727       REVIEW OF SYSTEMS:  Const:  fever, negative chills,  weight loss Eyes: negative diplopia or visual changes, negative eye pain ENT: negative coryza, negative sore throat Resp: negative cough, hemoptysis, dyspnea Cards: negative for chest pain, palpitations, lower extremity edema GU: negative for frequency, dysuria and hematuria GI: as above Skin: negative for rash  and pruritus Heme: negative for easy bruising and gum/nose bleeding MS:  weakness Neurolo:negative for headaches, dizziness, vertigo, memory problems  Psych: anxiety  Endocrine:  diabetes Allergy/Immunology- as above Objective:  VITALS:  BP (!) 134/96   Pulse (!) 106   Temp 98.6 F (37 C) (Oral)   Resp 17   Ht 5\' 2"  (1.575 m)   Wt 52.3 kg   SpO2 99%   BMI 21.09 kg/m   PHYSICAL EXAM:  General: Alert, cooperative, pale, chronically ill Head: Normocephalic, without obvious abnormality, atraumatic. Eyes: Conjunctivae clear, anicteric sclerae. Pupils are equal Left lids  swollen ENT Nares normal. No drainage or sinus tenderness. Lips, mucosa, and tongue normal. No Thrush Neck: Supple, symmetrical, no adenopathy, thyroid: non tender no carotid bruit and no JVD. Back: No CVA tenderness. Lungs:b/l air entry Heart: s1s2. Abdomen: Soft, non-tender,not distended. Bowel sounds normal. No masses Extremities: atraumatic, no cyanosis. No edema. No clubbing Skin: multiple excoriation.wounds over extremites Scalp infected wound Rt toes Wound over 3r d and 4th toe      Lymph: Cervical, supraclavicular normal. Neurologic: Grossly non-focal Pertinent Labs Lab Results CBC    Component Value Date/Time   WBC 12.3 (H) 08/17/2023 0100   RBC 3.54 (L) 08/17/2023 0100   HGB 11.2 (L) 08/17/2023 0100   HCT 33.1 (L) 08/17/2023 0100   PLT 328 08/17/2023 0100   MCV 93.5 08/17/2023 0100   MCH 31.6 08/17/2023 0100   MCHC 33.8 08/17/2023 0100   RDW 13.1 08/17/2023 0100   LYMPHSABS 0.5 (L) 08/15/2023 1231   MONOABS 0.2 08/15/2023 1231   EOSABS 0.0 08/15/2023 1231   BASOSABS 0.1 08/15/2023 1231       Latest Ref Rng & Units 08/17/2023    1:05 AM 08/16/2023    5:52 PM 08/16/2023    1:34 PM  CMP  Glucose 70 - 99 mg/dL 161  096  045   BUN 8 - 23 mg/dL 15  19  21    Creatinine 0.44 - 1.00 mg/dL 4.09  8.11  9.14   Sodium 135 - 145 mmol/L 135  134  135   Potassium 3.5 - 5.1 mmol/L 3.6  4.0  3.7   Chloride 98 - 111 mmol/L 100  96  97   CO2 22 - 32 mmol/L 25  24  24    Calcium 8.9 - 10.3 mg/dL 8.1  8.2  8.5       Microbiology: Recent Results (from the past 240 hours)  Resp panel by RT-PCR (RSV, Flu A&B, Covid) Anterior Nasal Swab     Status: None   Collection Time: 08/15/23 12:31 PM   Specimen: Anterior Nasal Swab  Result Value Ref Range Status   SARS Coronavirus 2 by RT PCR NEGATIVE NEGATIVE Final    Comment: (NOTE) SARS-CoV-2 target nucleic acids are NOT DETECTED.  The SARS-CoV-2 RNA is generally detectable in upper respiratory specimens during the acute  phase of infection. The lowest concentration of SARS-CoV-2 viral copies this assay can detect is 138 copies/mL. A negative result does not preclude SARS-Cov-2 infection and should not be used as the sole basis for treatment or other patient management decisions. A negative result may occur with  improper specimen collection/handling, submission of specimen other than nasopharyngeal swab, presence of viral mutation(s) within the areas targeted by this assay, and inadequate number of viral copies(<138 copies/mL). A negative result must be combined with clinical observations, patient history, and epidemiological information. The expected result is Negative.  Fact Sheet for Patients:  BloggerCourse.com  Fact Sheet for Healthcare Providers:  SeriousBroker.it  This test is no t yet approved or cleared by the [place] FDA and  has been authorized for detection and/or diagnosis of SARS-CoV-2 by FDA under an Emergency Use Authorization (EUA). This EUA will remain  in effect (meaning this test can be used) for the duration of the COVID-19 declaration under Section 564(b)(1) of the Act, 21 U.S.C.section 360bbb-3(b)(1), unless the authorization is terminated  or revoked sooner.       Influenza A by PCR NEGATIVE NEGATIVE Final   Influenza B by PCR NEGATIVE NEGATIVE Final    Comment: (NOTE) The Xpert Xpress SARS-CoV-2/FLU/RSV plus assay is intended as an aid in the diagnosis of influenza from Nasopharyngeal swab specimens and should not be used as a sole basis for treatment. Nasal washings and aspirates are unacceptable for Xpert Xpress SARS-CoV-2/FLU/RSV testing.  Fact Sheet for Patients: BloggerCourse.com  Fact Sheet for Healthcare Providers: SeriousBroker.it  This test is not yet approved or cleared by the [place] FDA and has been authorized for detection and/or diagnosis of  SARS-CoV-2 by FDA under an Emergency Use Authorization (EUA). This EUA will remain in effect (meaning this test can be used) for the duration of the COVID-19 declaration under Section 564(b)(1) of the Act, 21 U.S.C. section 360bbb-3(b)(1), unless the authorization is terminated or revoked.     Resp Syncytial Virus by PCR NEGATIVE NEGATIVE Final    Comment: (NOTE) Fact Sheet for Patients: BloggerCourse.com  Fact Sheet for Healthcare Providers: SeriousBroker.it  This test is not yet approved or cleared by the [place] FDA and has been authorized for detection and/or diagnosis of SARS-CoV-2 by FDA under an Emergency Use Authorization (EUA). This EUA will remain in effect (meaning this test can be used) for the duration of the COVID-19 declaration under Section 564(b)(1) of the Act, 21 U.S.C. section 360bbb-3(b)(1), unless the authorization is terminated or revoked.  Performed at [facility], [address] [place], [place] [address]   Culture, blood (Routine X 2) w Reflex to ID Panel     Status: Abnormal (Preliminary result)   Collection Time: 08/15/23  4:04 PM   Specimen: BLOOD  Result Value Ref Range Status   Specimen Description   Final    BLOOD BLOOD LEFT WRIST Performed at [facility], [address], [place], [place] [address]    Special Requests   Final    BOTTLES DRAWN AEROBIC AND ANAEROBIC Blood Culture results may not be optimal due to an inadequate volume of blood received in culture bottles Performed at [facility], [address] [place], [place] [address]    Culture  Setup Time   Final    GRAM POSITIVE COCCI AEROBIC BOTTLE ONLY CRITICAL RESULT CALLED TO, READ BACK BY AND VERIFIED WITH: ERIN [facility] [address] 08/16/2023 AT 1032 SRR GRAM STAIN REVIEWED-AGREE WITH RESULT    Culture (A)  Final    STAPHYLOCOCCUS AUREUS SUSCEPTIBILITIES TO FOLLOW Performed at [facility],  [address] [place], [place] [address]    Report Status PENDING  Incomplete  Blood Culture ID Panel (Reflexed)     Status: Abnormal   Collection Time: 08/15/23  4:04 PM  Result Value Ref Range Status   Enterococcus faecalis NOT DETECTED NOT DETECTED Final   Enterococcus Faecium NOT DETECTED NOT DETECTED Final   Listeria monocytogenes NOT DETECTED NOT DETECTED Final   Staphylococcus species DETECTED (A) NOT DETECTED Final    Comment: CRITICAL RESULT CALLED TO, READ BACK BY  AND VERIFIED WITH: ERIN [facility] 08/16/2023 AT 1032 SRR    Staphylococcus aureus (BCID) DETECTED (A) NOT DETECTED Final    Comment: Methicillin (oxacillin)-resistant Staphylococcus aureus (MRSA). MRSA is predictably resistant to beta-lactam antibiotics (except ceftaroline). Preferred therapy is vancomycin unless clinically contraindicated. Patient requires contact precautions if  hospitalized. CRITICAL RESULT CALLED TO, READ BACK BY AND VERIFIED WITH: ERIN [facility] 08/16/2023 AT 1032 SRR    Staphylococcus epidermidis NOT DETECTED NOT DETECTED Final   Staphylococcus lugdunensis NOT DETECTED NOT DETECTED Final   Streptococcus species NOT DETECTED NOT DETECTED Final   Streptococcus agalactiae NOT DETECTED NOT DETECTED Final   Streptococcus pneumoniae NOT DETECTED NOT DETECTED Final   Streptococcus pyogenes NOT DETECTED NOT DETECTED Final   A.calcoaceticus-baumannii NOT DETECTED NOT DETECTED Final   Bacteroides fragilis NOT DETECTED NOT DETECTED Final   Enterobacterales NOT DETECTED NOT DETECTED Final   Enterobacter cloacae complex NOT DETECTED NOT DETECTED Final   Escherichia coli NOT DETECTED NOT DETECTED Final   Klebsiella aerogenes NOT DETECTED NOT DETECTED Final   Klebsiella oxytoca NOT DETECTED NOT DETECTED Final   Klebsiella pneumoniae NOT DETECTED NOT DETECTED Final   Proteus species NOT DETECTED NOT DETECTED Final   Salmonella species NOT DETECTED NOT DETECTED Final   Serratia marcescens NOT DETECTED NOT DETECTED  Final   Haemophilus influenzae NOT DETECTED NOT DETECTED Final   Neisseria meningitidis NOT DETECTED NOT DETECTED Final   Pseudomonas aeruginosa NOT DETECTED NOT DETECTED Final   Stenotrophomonas maltophilia NOT DETECTED NOT DETECTED Final   Candida albicans NOT DETECTED NOT DETECTED Final   Candida auris NOT DETECTED NOT DETECTED Final   Candida glabrata NOT DETECTED NOT DETECTED Final   Candida krusei NOT DETECTED NOT DETECTED Final   Candida parapsilosis NOT DETECTED NOT DETECTED Final   Candida tropicalis NOT DETECTED NOT DETECTED Final   Cryptococcus neoformans/gattii NOT DETECTED NOT DETECTED Final   Meth resistant mecA/C and MREJ DETECTED (A) NOT DETECTED Final    Comment: CRITICAL RESULT CALLED TO, READ BACK BY AND VERIFIED WITH: ERIN [facility] 08/16/2023 AT 1032 SRR Performed at [facility], [address] [place], [place] [address]   Culture, blood (Routine X 2) w Reflex to ID Panel     Status: None (Preliminary result)   Collection Time: 08/15/23  5:16 PM   Specimen: BLOOD  Result Value Ref Range Status   Specimen Description BLOOD BLOOD LEFT ARM AEROBIC BOTTLE ONLY  Final   Special Requests   Final    BOTTLES DRAWN AEROBIC ONLY Blood Culture results may not be optimal due to an inadequate volume of blood received in culture bottles   Culture   Final    NO GROWTH 2 DAYS Performed at [facility], [address] [place], [place] [address]    Report Status PENDING  Incomplete  MRSA Next Gen by PCR, Nasal     Status: Abnormal   Collection Time: 08/15/23  5:32 PM   Specimen: Nasal Mucosa; Nasal Swab  Result Value Ref Range Status   MRSA by PCR Next Gen DETECTED (A) NOT DETECTED Final    Comment: CRITICAL RESULT CALLED TO, READ BACK BY AND VERIFIED WITH: Corey Skains RN ICU @ 1856 08/15/23 LFD (NOTE) The GeneXpert MRSA Assay (FDA approved for NASAL specimens only), is one component of a comprehensive MRSA colonization surveillance program. It is not  intended to diagnose MRSA infection nor to guide or monitor treatment for MRSA infections. Test performance is not FDA approved in patients less than 2 years  old. Performed at [facility], [address] [place], [place] [address]   Aerobic Culture w Gram Stain (superficial specimen)     Status: None (Preliminary result)   Collection Time: 08/16/23  1:25 PM   Specimen: Wound  Result Value Ref Range Status   Specimen Description   Final    WOUND Performed at [facility], [address], [place], [place] [address]    Special Requests   Final    TOES Performed at [facility], [address] [place], [place] [address]    Gram Stain NO WBC SEEN FEW GRAM POSITIVE COCCI   Final   Culture   Final    ABUNDANT STAPHYLOCOCCUS AUREUS SUSCEPTIBILITIES TO FOLLOW Performed at [facility], [address] [place], [place] [address]    Report Status PENDING  Incomplete  Aerobic Culture w Gram Stain (superficial specimen)     Status: None (Preliminary result)   Collection Time: 08/16/23  2:11 PM   Specimen: Wound  Result Value Ref Range Status   Specimen Description   Final    WOUND Performed at [facility], [address], [place], [place] [address]    Special Requests   Final    SCALP Performed at [facility], [address] [place], [place] [address]    Gram Stain   Final    RARE WBC PRESENT, PREDOMINANTLY PMN FEW GRAM POSITIVE COCCI    Culture   Final    CULTURE REINCUBATED FOR BETTER GROWTH Performed at [facility], [address], [place], [place] [address]    Report Status PENDING  Incomplete    IMAGING RESULTS: MRI - marrow edema 4th toe ? 4th metatarsal neck fracture I have personally reviewed the films ? Impression/Recommendation Uncontrolled diabetes mellitus with DKA Being treated with fluids and insulin as per primary team  MRSA bacteremia Source possibly the right toe ulcer.  Culture will  has been sent Patient is currently on vancomycin She needs TEE Repeat blood culture after 48hrs of appropirate antibiotic  Right toe infected wound Likely MRSA MRI was done and there is marrow edema involving 4th toe, and great toe  Scalp wound- likely due to MRSA- culture sent  Recent nasal abscess due to MRSA  Cocaine and alcohol use Pt is not a candidate for home IV antibiotic   Aki has resolved  Hyponatremia corrected     ________________________________________________ Discussed with patient, requesting provider Note:  This document was prepared using Dragon voice recognition software and may include unintentional dictation errors.

## 2023-08-18 ENCOUNTER — Inpatient Hospital Stay: Payer: Medicaid Other

## 2023-08-18 DIAGNOSIS — M86171 Other acute osteomyelitis, right ankle and foot: Secondary | ICD-10-CM

## 2023-08-18 DIAGNOSIS — E1010 Type 1 diabetes mellitus with ketoacidosis without coma: Secondary | ICD-10-CM

## 2023-08-18 DIAGNOSIS — I480 Paroxysmal atrial fibrillation: Secondary | ICD-10-CM | POA: Diagnosis not present

## 2023-08-18 DIAGNOSIS — E1165 Type 2 diabetes mellitus with hyperglycemia: Secondary | ICD-10-CM | POA: Diagnosis not present

## 2023-08-18 DIAGNOSIS — D72829 Elevated white blood cell count, unspecified: Secondary | ICD-10-CM | POA: Diagnosis not present

## 2023-08-18 DIAGNOSIS — B9562 Methicillin resistant Staphylococcus aureus infection as the cause of diseases classified elsewhere: Secondary | ICD-10-CM | POA: Diagnosis not present

## 2023-08-18 DIAGNOSIS — R7881 Bacteremia: Secondary | ICD-10-CM | POA: Diagnosis not present

## 2023-08-18 LAB — BASIC METABOLIC PANEL
Anion gap: 9 (ref 5–15)
BUN: 14 mg/dL (ref 8–23)
CO2: 29 mmol/L (ref 22–32)
Calcium: 8.5 mg/dL — ABNORMAL LOW (ref 8.9–10.3)
Chloride: 98 mmol/L (ref 98–111)
Creatinine, Ser: 0.41 mg/dL — ABNORMAL LOW (ref 0.44–1.00)
GFR, Estimated: >60 mL/min (ref >60)
Glucose, Bld: 230 mg/dL — ABNORMAL HIGH (ref 70–99)
Potassium: 3.6 mmol/L (ref 3.5–5.1)
Sodium: 136 mmol/L (ref 135–145)

## 2023-08-18 LAB — GLUCOSE, CAPILLARY
Glucose-Capillary: 311 mg/dL — ABNORMAL HIGH (ref 70–99)
Glucose-Capillary: 272 mg/dL — ABNORMAL HIGH (ref 70–99)
Glucose-Capillary: 170 mg/dL — ABNORMAL HIGH (ref 70–99)
Glucose-Capillary: 293 mg/dL — ABNORMAL HIGH (ref 70–99)

## 2023-08-18 LAB — CBC
HCT: 37 % (ref 36.0–46.0)
Hemoglobin: 12.6 g/dL (ref 12.0–15.0)
MCH: 31.9 pg (ref 26.0–34.0)
MCHC: 34.1 g/dL (ref 30.0–36.0)
MCV: 93.7 fL (ref 80.0–100.0)
Platelets: 321 10*3/uL (ref 150–400)
RBC: 3.95 MIL/uL (ref 3.87–5.11)
RDW: 12.8 % (ref 11.5–15.5)
WBC: 7.9 10*3/uL (ref 4.0–10.5)
nRBC: 0 % (ref 0.0–0.2)

## 2023-08-18 LAB — AEROBIC CULTURE W GRAM STAIN (SUPERFICIAL SPECIMEN): Gram Stain: NONE SEEN

## 2023-08-18 MED ORDER — INSULIN GLARGINE-YFGN 100 UNIT/ML ~~LOC~~ SOLN
6.0000 [IU] | Freq: Once | SUBCUTANEOUS | Status: AC
  Administered 2023-08-18: 6 [IU] via SUBCUTANEOUS
  Filled 2023-08-18: qty 0.06

## 2023-08-18 MED ORDER — INSULIN ASPART 100 UNIT/ML IJ SOLN
6.0000 [IU] | Freq: Three times a day (TID) | INTRAMUSCULAR | Status: DC
  Administered 2023-08-18 – 2023-08-19 (×4): 6 [IU] via SUBCUTANEOUS
  Filled 2023-08-18 (×4): qty 1

## 2023-08-18 MED ORDER — SODIUM CHLORIDE 0.9 % IV SOLN: INTRAVENOUS | Status: DC

## 2023-08-18 MED ORDER — PHENOL 1.4 % MT LIQD
1.0000 | OROMUCOSAL | Status: DC | PRN
  Administered 2023-08-18: 1 via OROMUCOSAL
  Filled 2023-08-18: qty 177

## 2023-08-18 MED ORDER — MELATONIN 5 MG PO TABS
10.0000 mg | ORAL_TABLET | Freq: Once | ORAL | Status: AC
  Administered 2023-08-18: 10 mg via ORAL
  Filled 2023-08-18: qty 2

## 2023-08-18 MED ORDER — GUAIFENESIN 100 MG/5ML PO LIQD
5.0000 mL | ORAL | Status: DC | PRN
  Administered 2023-08-18 – 2023-08-19 (×3): 5 mL via ORAL
  Filled 2023-08-18 (×3): qty 10

## 2023-08-18 NOTE — Progress Notes (Signed)
Went to the room to asked patient to sign the informed consent for TEE but patient states that she thinks she don't need it and she won't sign the consent. Also said that she want to go home tomorrow.

## 2023-08-18 NOTE — TOC Initial Note (Signed)
Transition of Care [facility]) - Initial/Assessment Note    Patient Details  Name: Michaela Johnson MRN: [id number] Date of Birth: [birth year]  Transition of Care [facility]) CM/SW Contact:    Marlowe Sax, RN Phone Number: 08/18/2023, 12:17 PM  Clinical Narrative:                   Transition of Care [facility]) Screening Note   Patient Details  Name: Michaela Johnson Date of Birth: 09/22/1960   Transition of Care [facility]) CM/SW Contact:    Marlowe Sax, RN Phone Number: 08/18/2023, 12:17 PM    Transition of Care Department [facility]) has reviewed patient and no TOC needs have been identified at this time. We will continue to monitor patient advancement through interdisciplinary progression rounds. If new patient transition needs arise, please place a TOC consult.         Patient Goals and CMS Choice            Expected Discharge Plan and Services                                              Prior Living Arrangements/Services                       Activities of Daily Living   ADL Screening (condition at time of admission) Independently performs ADLs?: Yes (appropriate for developmental age) Is the patient deaf or have difficulty hearing?: No Does the patient have difficulty seeing, even when wearing glasses/contacts?: No Does the patient have difficulty concentrating, remembering, or making decisions?: No  Permission Sought/Granted                  Emotional Assessment              Admission diagnosis:  DKA (diabetic ketoacidosis) (HCC) [E11.10] Diabetic ketoacidosis without coma associated with type 1 diabetes mellitus (HCC) [E10.10] Patient Active Problem List   Diagnosis Date Noted   Acute osteomyelitis of toe, right (HCC) 08/18/2023   MRSA bacteremia 08/16/2023   DKA (diabetic ketoacidosis) (HCC) 08/15/2023   High anion gap metabolic acidosis 08/15/2023   Intractable nausea and vomiting 08/15/2023   Alopecia of scalp 08/15/2023    Cellulitis of scalp 08/15/2023   Cellulitis of right toe 08/15/2023   AKI (acute kidney injury) (HCC) 08/15/2023   Elevated troponin 08/15/2023   Abscess of nasal cavity 06/16/2023   Uncontrolled type 2 diabetes mellitus with hyperglycemia, with long-term current use of insulin (HCC) 06/16/2023   Toe ulcer (HCC) 06/16/2023   Hyponatremia 06/16/2023   Hypokalemia 06/16/2023   Leukocytosis 86/57/8469   Head lice 06/15/2023   Metabolic acidosis 06/14/2022   Iatrogenic pneumothorax 06/14/2022   History of GI bleed 06/03/2022   Cirrhosis of liver (HCC) 06/03/2022   Cocaine use 06/03/2022   Diabetic ketoacidosis (HCC) 06/01/2022   Smoker 05/01/2022   Marijuana use 05/01/2022   Depression, major, single episode, complete remission (HCC) dx'd 15 years ago 05/01/2022   Acute postoperative anemia due to expected blood loss 01/05/2022   Closed right radial fracture 01/03/2022   Closed intertrochanteric fracture of hip, right, initial encounter (HCC) 01/02/2022   Acute kidney injury (HCC) 04/18/2021   Hyperkalemia 04/18/2021   Multiple lung nodules on CT 04/18/2021   Vitamin D deficiency    Neuropathy    Chronic anticoagulation 07/31/2020   Chronic alcohol  use 07/31/2020   Nicotine dependence 07/31/2020   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 ([address]), [place] - [address]) [place] [address] Phone: [phone] Fax: [phone]  [facility] [address] [place] [address] Phone: [phone] Fax: [phone]     Social Drivers of Health (SDOH) Social History: SDOH Screenings    Food Insecurity: No Food Insecurity (08/16/2023)  Housing: Low Risk  (08/16/2023)  Recent Concern: Housing - Medium Risk (06/15/2023)  Transportation Needs: No Transportation Needs (08/16/2023)  Utilities: Not At Risk (08/16/2023)  Tobacco Use: High Risk (08/15/2023)   SDOH Interventions:     Readmission Risk Interventions    08/18/2023   12:17 PM 06/16/2022   11:54 AM 06/04/2022    8:58 AM  Readmission Risk Prevention Plan  Transportation Screening Complete Complete Complete  PCP or Specialist Appt within 5-7 Days Complete  Complete  Home Care Screening Complete  Complete  Medication Review (RN CM) Complete  Complete  Medication Review (RN Care Manager)  Complete   PCP or Specialist appointment within 3-5 days of discharge  Complete   HRI or Home Care Consult  Complete   SW Recovery Care/Counseling Consult  Not Complete   SW Consult Not Complete Comments  NA   Palliative Care Screening  Not Applicable   Skilled Nursing Facility  Not Applicable

## 2023-08-18 NOTE — Consult Note (Signed)
PODIATRY CONSULTATION  NAME Michaela Johnson MRN [id number] DOB 1960/10/22 DOA 08/15/2023   Reason for consult:  Chief Complaint  Patient presents with   Emesis   Hypotension    Attending/Consulting physician: S. Amin  History of present illness: 63 y.o. female with history of insulin-dependent diabetes GERD A-fib on Xarelto neuropathy who presented initially with concern for nausea and vomiting.  She was subsequently found to have ulcerations present on the right third and fourth toe.  MRI was subsequently obtained.  Concern for osteomyelitis.  Patient tells me she she has had the wounds for a while.  She thinks they started from dust mites that are burrowing under her skin.  She does report some sensation in the toes though unclear how much she really has.  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 07/30/2020   Constipation    Depression    Diabetes mellitus without complication (HCC)    Dysrhythmia     PAF   GERD (gastroesophageal reflux disease)    Hypertension    Neuropathy    Nicotine dependence 07/31/2020   PAF (paroxysmal atrial fibrillation) (HCC)    Panic attacks    Pneumonia    Tobacco use    Vitamin D deficiency        Latest Ref Rng & Units 08/18/2023    4:58 AM 08/17/2023    1:00 AM 08/16/2023    3:36 AM  CBC  WBC 4.0 - 10.5 K/uL 7.9  12.3  13.1   Hemoglobin 12.0 - 15.0 g/dL 40.9  81.1  91.4   Hematocrit 36.0 - 46.0 % 37.0  33.1  32.6   Platelets 150 - 400 K/uL 321  328  381        Latest Ref Rng & Units 08/18/2023    4:58 AM 08/17/2023    1:05 AM 08/16/2023    5:52 PM  BMP  Glucose 70 - 99 mg/dL 782  956  213   BUN 8 - 23 mg/dL 14  15  19    Creatinine 0.44 - 1.00 mg/dL 0.86  5.78  4.69   Sodium 135 - 145 mmol/L 136  135  134   Potassium 3.5 - 5.1 mmol/L 3.6  3.6  4.0    Chloride 98 - 111 mmol/L 98  100  96   CO2 22 - 32 mmol/L 29  25  24    Calcium 8.9 - 10.3 mg/dL 8.5  8.1  8.2       Physical Exam: Lower Extremity Exam Vasc: R - PT palpable, DP palpable. Cap refill < 3 sec to digits  L - PT palpable, DP palpable. Cap refill <3 sec to digits  Derm: R -ulceration with erythema and edema surrounding especially of the fourth toe.  With more erythema and edema however there is some erythema and edema of the third toe as well.  Unable to stage Tis there is a appear superficial and there is some breakdown in between the toes and the purulence is present.  L - Normal temp/texture/turgor with no open lesion or clinical signs of infection   MSK:  R -edema noted of the third and fourth toe right foot toe is slightly tender to palpation  L -  No gross deformities. Compartments soft, non-tender, compressible  Neuro:  R - Gross sensation diminished. Gross motor function intact   L - Gross sensation diminished. Gross motor function intact  MRI right foot concern for osteomyelitis of the right fourth toe as well as possible osteomyelitis seen on 1 image of the third toe.  ASSESSMENT/PLAN OF CARE 63 y.o. female with PMHx significant for DM2 with neuropathy uncontrolled A1c 9.8 with MRSA bacteremia source possibly scalp versus foot ulcer.  Chronic ulceration third and fourth toe now with underlying osteomyelitis especially of the fourth toe early osteomyelitis of the third toe not excluded  WBC 7.9 ESR 57 then 45 on repeat ABI PVR normal bilateral lower extremity  -N.p.o. past midnight for OR tomorrow for right third and fourth toe amputation at MPJ level.  Discussed with the patient and she is in agreement to proceed -Okay to continue IV abx broad spectrum pending further culture data - Anticoagulation: On Xarelto okay to continue into procedure - Wound care: Betadine paint affected toes - WB status: Weightbearing as tolerated in postop shoe - Will continue to  follow   Thank you for the consult.  Please contact me directly with any questions or concerns.           Corinna Gab, DPM [facility]    [address] [place], [place] [address]                Office [phone]  Fax [phone]

## 2023-08-18 NOTE — Inpatient Diabetes Management (Addendum)
Inpatient Diabetes Program Recommendations  AACE/ADA: New Consensus Statement on Inpatient Glycemic Control  Target Ranges:  Prepandial:   less than 140 mg/dL      Peak postprandial:   less than 180 mg/dL (1-2 hours)      Critically ill patients:  140 - 180 mg/dL    Latest Reference Range & Units 08/17/23 06:56 08/17/23 09:05 08/17/23 11:58 08/17/23 17:48 08/17/23 18:15 08/17/23 22:12 08/18/23 07:59  Glucose-Capillary 70 - 99 mg/dL 784 (H) 696 (H) 295 (H) 266 (H) 141 (H) 306 (H) 311 (H)   Review of Glycemic Control  Diabetes history: DM2 Outpatient Diabetes medications: Lantus 40 units daily, Novolog 4 units TID with meals, Ozempic 2 mg Qweek (Sunday; recently started and only took for 2 weeks then stopped due to hypoglycemia for 3 days following injection), Jardiance 25 mg daily Current orders for Inpatient glycemic control: Semglee 10 units BID, Novolog 4 units TID with meals, Novolog 0-15 units TID with meals, Novolog 0-5 units QHS   Inpatient Diabetes Program Recommendations:     Insulin: Patient received Semglee 10 units at 9:17 am on 1/27. Semglee 10 units that was ordered for 10pm last night was NOT GIVEN (per [facility] - charted as medication not available). CBG 311 mg/dl today. Please consider changing Semglee to 18 units daily (to be given this morning at 10am) and increasing meal coverage to Novolog 6 units TID with meals.   Outpatient DM: At time of discharge please provide Rx for Lantus pens [id number]), Novolog pens (#[id number]), insulin pen needles (#[id number]), Jardiance 25 mg daily, glucose test strips (#[id number]).  Patient states she also needs prescriptions for her omeprazole and gabapentin.  Would recommend to have Rx filled through outpatient [facility] pharmacy and have all meds and supplies delivered to patient's room prior to discharge.   Discharge Recommendations: Other recommendations: Needs Rx for Jardiance 25 mg daily Long acting recommendations: Insulin Glargine (LANTUS) Solostar Pen  dose to be determined  Short acting recommendations:  Meal + Correction coverage Insulin aspart (NOVOLOG) FlexPen  Moderate Scale.  0-20 units TID (5 units for meal coverage plus correction) Hypoglycemia treatment recommendations: Baqsimi 3mg  Supply/Referral recommendations: Test strips Pen needles - standard   Use Adult Diabetes Insulin Treatment Post Discharge order set.  Thanks, Orlando Penner, RN, MSN, CDCES Diabetes Coordinator Inpatient Diabetes Program [phone] (Team Pager from 8am to 5pm)

## 2023-08-18 NOTE — Plan of Care (Signed)
  Problem: Coping: Goal: Ability to adjust to condition or change in health will improve Outcome: Progressing   Problem: Metabolic: Goal: Ability to maintain appropriate glucose levels will improve Outcome: Progressing   Problem: Education: Goal: Ability to describe self-care measures that may prevent or decrease complications (Diabetes Survival Skills Education) will improve Outcome: Progressing   Problem: Metabolic: Goal: Ability to maintain appropriate glucose levels will improve Outcome: Progressing   Problem: Nutrition: Goal: Adequate nutrition will be maintained Outcome: Progressing   Problem: Coping: Goal: Level of anxiety will decrease Outcome: Progressing   Problem: Safety: Goal: Ability to remain free from injury will improve Outcome: Progressing

## 2023-08-18 NOTE — Progress Notes (Signed)
Date of Admission:  08/15/2023   Total days of antibiotics ***        Day ***        Day ***        Day ***   ID: Michaela Johnson is a 63 y.o. female with  *** Principal Problem:   DKA (diabetic ketoacidosis) (HCC) Active Problems:   Seizure (HCC)   PAF (paroxysmal atrial fibrillation) (HCC)   Substance abuse (HCC)   Chronic anticoagulation   Chronic alcohol use   Nicotine dependence   Hypertension associated with diabetes (HCC)   Depression, major, single episode, complete remission (HCC) dx'd 15 years ago   Cirrhosis of liver (HCC)   Metabolic acidosis   Leukocytosis   Head lice   Uncontrolled type 2 diabetes mellitus with hyperglycemia, with long-term current use of insulin (HCC)   Toe ulcer (HCC)   Hyponatremia   High anion gap metabolic acidosis   Intractable nausea and vomiting   Alopecia of scalp   Cellulitis of scalp   Cellulitis of right toe   AKI (acute kidney injury) (HCC)   Elevated troponin   MRSA bacteremia   Acute osteomyelitis of toe, right (HCC)    Subjective: ***  Medications:   Chlorhexidine Gluconate Cloth  6 each Topical Q0600   gabapentin  900 mg Oral QHS   insulin aspart  0-15 Units Subcutaneous TID WC   insulin aspart  0-5 Units Subcutaneous QHS   insulin aspart  6 Units Subcutaneous TID WC   insulin glargine-yfgn  10 Units Subcutaneous BID   mupirocin ointment  1 Application Nasal BID   pantoprazole  40 mg Oral Daily   rivaroxaban  20 mg Oral Q supper    Objective: Vital signs in last 24 hours: Patient Vitals for the past 24 hrs:  BP Temp Temp src Pulse Resp SpO2  08/18/23 0822 133/88 97.9 F (36.6 C) -- 96 18 93 %  08/17/23 2212 (!) 145/86 99.2 F (37.3 C) Oral 86 20 98 %  08/17/23 1615 (!) 150/82 98.6 F (37 C) Oral 87 16 100 %     LDA Foley Central lines Other catheters  PHYSICAL EXAM:  General: Alert, cooperative, no distress, appears stated age.  Head: Normocephalic, without obvious abnormality, atraumatic. Eyes:  Conjunctivae clear, anicteric sclerae. Pupils are equal ENT Nares normal. No drainage or sinus tenderness. Lips, mucosa, and tongue normal. No Thrush Neck: Supple, symmetrical, no adenopathy, thyroid: non tender no carotid bruit and no JVD. Back: No CVA tenderness. Lungs: Clear to auscultation bilaterally. No Wheezing or Rhonchi. No rales. Heart: Regular rate and rhythm, no murmur, rub or gallop. Abdomen: Soft, non-tender,not distended. Bowel sounds normal. No masses Extremities: atraumatic, no cyanosis. No edema. No clubbing Skin: No rashes or lesions. Or bruising Lymph: Cervical, supraclavicular normal. Neurologic: Grossly non-focal  Lab Results    Latest Ref Rng & Units 08/18/2023    4:58 AM 08/17/2023    1:00 AM 08/16/2023    3:36 AM  CBC  WBC 4.0 - 10.5 K/uL 7.9  12.3  13.1   Hemoglobin 12.0 - 15.0 g/dL 16.1  09.6  04.5   Hematocrit 36.0 - 46.0 % 37.0  33.1  32.6   Platelets 150 - 400 K/uL 321  328  381        Latest Ref Rng & Units 08/18/2023    4:58 AM 08/17/2023    1:05 AM 08/16/2023    5:52 PM  CMP  Glucose 70 - 99 mg/dL 409  163  138   BUN 8 - 23 mg/dL 14  15  19    Creatinine 0.44 - 1.00 mg/dL 1.61  0.96  0.45   Sodium 135 - 145 mmol/L 136  135  134   Potassium 3.5 - 5.1 mmol/L 3.6  3.6  4.0   Chloride 98 - 111 mmol/L 98  100  96   CO2 22 - 32 mmol/L 29  25  24    Calcium 8.9 - 10.3 mg/dL 8.5  8.1  8.2       Microbiology:  Studies/Results: US ARTERIAL ABI (SCREENING LOWER EXTREMITY) Result Date: 08/18/2023 CLINICAL DATA:  Diabetic ulcers and possible osteomyelitis of right toes. EXAM: NONINVASIVE PHYSIOLOGIC VASCULAR STUDY OF BILATERAL LOWER EXTREMITIES TECHNIQUE: Evaluation of both lower extremities were performed at rest, including calculation of ankle-brachial indices with single level Doppler, pressure and pulse volume recording. COMPARISON:  None Available. FINDINGS: Right ABI:  1.05 Left ABI:  1.12 Right Lower Extremity: Biphasic/near triphasic posterior tibial  and triphasic dorsalis pedis waveforms. Left Lower Extremity: Biphasic posterior tibial and triphasic dorsalis pedis waveforms. 1.0-1.4 Normal IMPRESSION: Normal resting bilateral ankle-brachial indices. Distal waveforms are essentially normal/near normal. Electronically Signed   By: Irish Lack M.D.   On: 08/18/2023 10:34   ECHOCARDIOGRAM COMPLETE Result Date: 08/17/2023    ECHOCARDIOGRAM REPORT   Patient Name:   Michaela Johnson Date of Exam: 08/16/2023 Medical Rec #:  [id number]     Height:       62.0 in Accession #:    [id number]    Weight:       115.3 lb Date of Birth:  01/05/1961     BSA:          1.513 m Patient Age:    62 years      BP:           103/40 mmHg Patient Gender: F             HR:           104 bpm. Exam Location:  [facility] Procedure: 2D Echo, Cardiac Doppler and Color Doppler Indications:     Bacteremia R78.81  History:         Patient has prior history of Echocardiogram examinations, most                  recent 01/03/2022. Arrythmias:Atrial Fibrillation; Risk                  Factors:Hypertension, Diabetes and Current Smoker.  Sonographer:     Lucendia Herrlich RCS Referring Phys:  [id number] AMIN Diagnosing Phys: Chilton Si MD IMPRESSIONS  1. Overall systolic funciton is preserved, however septal function is abnormal consistent with bundle branch block. Left ventricular ejection fraction, by estimation, is 55%. The left ventricle has normal function. The left ventricle demonstrates regional wall motion abnormalities (see scoring diagram/findings for description). Left ventricular diastolic parameters are consistent with Grade I diastolic dysfunction (impaired relaxation).  2. Right ventricular systolic function is normal. The right ventricular size is normal. There is normal pulmonary artery systolic pressure.  3. The mitral valve is normal in structure. No evidence of mitral valve regurgitation. No evidence of mitral stenosis.  4. The aortic valve is tricuspid. Aortic valve  regurgitation is not visualized. No aortic stenosis is present.  5. The inferior vena cava is normal in size with greater than 50% respiratory variability, suggesting right atrial pressure of 3 mmHg. FINDINGS  Left Ventricle: Overall systolic funciton is preserved, however  septal function is abnormal consistent with bundle branch block. Left ventricular ejection fraction, by estimation, is 55%. The left ventricle has normal function. The left ventricle demonstrates regional wall motion abnormalities. The left ventricular internal cavity size was normal in size. There is no left ventricular hypertrophy. Abnormal (paradoxical) septal motion, consistent with left bundle branch block. Left ventricular diastolic parameters are consistent with Grade I diastolic dysfunction (impaired relaxation). Indeterminate filling pressures.  LV Wall Scoring: The entire septum is hypokinetic. The entire anterior wall, entire lateral wall, entire inferior wall, and apex are normal. Right Ventricle: The right ventricular size is normal. No increase in right ventricular wall thickness. Right ventricular systolic function is normal. There is normal pulmonary artery systolic pressure. The tricuspid regurgitant velocity is 1.47 m/s, and  with an assumed right atrial pressure of 3 mmHg, the estimated right ventricular systolic pressure is 11.6 mmHg. Left Atrium: Left atrial size was normal in size. Right Atrium: Right atrial size was normal in size. Pericardium: There is no evidence of pericardial effusion. Mitral Valve: The mitral valve is normal in structure. No evidence of mitral valve regurgitation. No evidence of mitral valve stenosis. Tricuspid Valve: The tricuspid valve is normal in structure. Tricuspid valve regurgitation is trivial. No evidence of tricuspid stenosis. Aortic Valve: The aortic valve is tricuspid. Aortic valve regurgitation is not visualized. No aortic stenosis is present. Aortic valve peak gradient measures 8.5 mmHg.  Pulmonic Valve: The pulmonic valve was normal in structure. Pulmonic valve regurgitation is not visualized. No evidence of pulmonic stenosis. Aorta: The aortic root is normal in size and structure. Venous: The inferior vena cava is normal in size with greater than 50% respiratory variability, suggesting right atrial pressure of 3 mmHg. IAS/Shunts: No atrial level shunt detected by color flow Doppler.  LEFT VENTRICLE PLAX 2D LVIDd:         3.60 cm   Diastology LVIDs:         2.60 cm   LV e' medial:    7.29 cm/s LV PW:         0.80 cm   LV E/e' medial:  12.9 LV IVS:        0.80 cm   LV e' lateral:   15.30 cm/s LVOT diam:     2.00 cm   LV E/e' lateral: 6.1 LV SV:         43 LV SV Index:   28 LVOT Area:     3.14 cm                           3D Volume EF:                          3D EF:        55 % RIGHT VENTRICLE             IVC RV S prime:     13.10 cm/s  IVC diam: 2.00 cm TAPSE (M-mode): 2.0 cm LEFT ATRIUM             Index        RIGHT ATRIUM           Index LA diam:        3.30 cm 2.18 cm/m   RA Area:     13.50 cm LA Vol (A2C):   34.2 ml 22.61 ml/m  RA Volume:   34.20 ml  22.61 ml/m LA Vol (  A4C):   41.5 ml 27.43 ml/m LA Biplane Vol: 39.7 ml 26.24 ml/m  AORTIC VALVE AV Area (Vmax): 1.80 cm AV Vmax:        146.00 cm/s AV Peak Grad:   8.5 mmHg LVOT Vmax:      83.80 cm/s LVOT Vmean:     58.700 cm/s LVOT VTI:       0.137 m  AORTA Ao Root diam: 3.10 cm Ao Asc diam:  3.00 cm MITRAL VALVE                TRICUSPID VALVE MV Area (PHT): 4.96 cm     TR Peak grad:   8.6 mmHg MV Decel Time: 153 msec     TR Vmax:        147.00 cm/s MV E velocity: 93.80 cm/s MV A velocity: 113.00 cm/s  SHUNTS MV E/A ratio:  0.83         Systemic VTI:  0.14 m                             Systemic Diam: 2.00 cm Chilton Si MD Electronically signed by Chilton Si MD Signature Date/Time: 08/17/2023/7:55:42 AM    Final (Updated)      Assessment/Plan: Uncontrolled diabetes mellitus with DKA Being treated with fluids and insulin as  per primary team   MRSA bacteremia Source possibly the right toe ulcer.  Culture will has been sent Patient is currently on vancomycin She needs TEE Repeat blood culture after 48hrs of appropirate antibiotic   Right toe infected wound Likely MRSA MRI was done and there is marrow edema involving 4th toe, and great toe   Scalp wound- likely due to MRSA- culture sent   Recent nasal abscess due to MRSA   Cocaine and alcohol use Pt is not a candidate for home IV antibiotic     Aki has resolved   Hyponatremia corrected

## 2023-08-18 NOTE — Progress Notes (Signed)
Progress Note   Patient: Michaela Johnson QVZ:[id number] DOB: Nov 01, 1960 DOA: 08/15/2023     3 DOS: the patient was seen and examined on 08/18/2023   Brief hospital course: Michaela Johnson is a 63 year old female with history of insulin-dependent diabetes mellitus, GERD, atrial fibrillation on Xarelto, neuropathy, who presents emergency department for chief concerns of nausea vomiting since 6 AM on day of admission.  Vitals in the ED temperature 98.6, respiration 18, heart rate of 118, blood pressure 111/69, SpO2 100% on room air.  Serum sodium is 130, potassium 5.7, chloride 89, bicarb 10, nonfasting blood glucose 599,  BUN of 38, serum creatinine 1.27, EGFR 47, T. bili elevated at 2.0.  WBC 14.7, hemoglobin 14.3, platelets of 382.  troponin is 14.  COVID/influenza A/influenza B/RSV PCR were negative.  ED treatment: Sodium chloride 3 L bolus, insulin per endotool, ondansetron 4 mg IV one-time dose.  1/26: Vital stable, right foot imaging with soft tissue swelling of the first, third and fourth toes.  No radiographic evidence of osteomyelitis.  ESR was elevated at 45.  Ordered MRI to rule out osteomyelitis as she is high risk. Repeat labs with reopening And worsening beta-hydroxybutyrate acid so continuing Endo tool.  Blood cultures growing MRSA, discontinue cefepime and continuing vancomycin.  Echocardiogram ordered  1/27: Her wound culture from scalp and foot are also growing MRSA. Echocardiogram with EF of 55%, grade 1 diastolic dysfunction, also showed some regional wall motion abnormalities but no mention of any vegetation, cardiology was consulted for TEE.  Foot x-ray with concern of early osteomyelitis, consulting podiatry. DKA resolved-patient was started on basal and SSI.  1/28; going for right third and fourth toe amputation with podiatry tomorrow. Patient will need prolonged IV antibiotics, also need TEE-cardiology is aware and planning likely on Wednesday or Thursday. She is not a  candidate for home IV antibiotics due to her history of substance abuse.  Assessment and Plan: * DKA (diabetic ketoacidosis) (HCC) Patient presented with DKA, was not using insulin for the past few days.  Started on Endo tool along with bicarb infusion due to significant metabolic acidosis. DKA resolved and she was started on basal and short-acting  A1c of 9.8 -Continue with Semglee 10 units twice daily -Increasing mealtime coverage to 6 unit -SSI  Uncontrolled type 2 diabetes mellitus with hyperglycemia, with long-term current use of insulin (HCC) Uncontrolled with hyperglycemia, A1c of 9.8. -Semeglee 10 units twice daily -6 units of NovoLog with meal -SSI  MRSA bacteremia Blood cultures growing MRSA.  History of nasal abscess which grew MRSA few months ago which was treated with appropriately.   ID is on board Scalp and foot ulcer cultures are also growing MRSA Echocardiogram with no mention of vegetation will need TEE -Continue with vancomycin -Cardiology consulted for TEE -Repeat blood cultures   Toe ulcer (HCC) Right foot x-ray, without any obvious osteo, MRI with concern of early osteomyelitis Elevated CRP and ESR. Blood cultures growing MRSA -Continue with vancomycin -Podiatry consult-going to the OR tomorrow for right third and fourth toe amputation  Leukocytosis Resolved.  Growing MRSA in blood cultures. -Continue to monitor  PAF (paroxysmal atrial fibrillation) (HCC) Xarelto dosing decreased to 15 mg q. supper due to decreased creatinine clearance calculated at 35 mL/minute  Hypertension associated with diabetes (HCC) Blood pressure currently within goal.  Not on any antihypertensives at home. -Continue to monitor  AKI (acute kidney injury) (HCC) Secondary to DKA, high anion gap metabolic acidosis Status post aggressive fluid hydration per EDP AKI  resolved  Cellulitis of scalp In setting of uncontrolled insulin-dependent diabetes mellitus Cultures are  growing MRSA -Currently on vancomycin  Chronic anticoagulation Home Xarelto dosing reduced to 15 mg daily with supper for creatinine clearance adjustment  Hyponatremia Pseudohyponatremia secondary to hyperosmolar hyperglycemia Serum corrected sodium is 142,   Elevated troponin Secondary to demand ischemia, flat curve, continue current medical treatment per above  Seizure [facility]) Not on any medication at home Seizure precaution Ativan 2 mg IV as needed as needed for anxiety, seizure,   Intractable nausea and vomiting Improved.  Patient wants to eat now. -Continue with supportive care  High anion gap metabolic acidosis Secondary to DKA Gap With mild reopening after closing. -Continue with Endo tool -Continue to monitor  Nicotine dependence As needed nicotine patch ordered for nicotine craving  Chronic alcohol use Ativan 2 mg IV as needed for anxiety, 2 doses ordered with instructions to administer as appropriate and then let provider know and if needed we can initiate CIWA precaution  Cellulitis of right toe With possible abscesses and osteomyelitis Right foot x-ray ordered on admission Cefepime and vancomycin per pharmacy   Subjective: Patient was seen and examined today.  No new concern.  Physical Exam: Vitals:   08/17/23 1336 08/17/23 1615 08/17/23 2212 08/18/23 0822  BP: (!) 152/72 (!) 150/82 (!) 145/86 133/88  Pulse: (!) 104 87 86 96  Resp: 18 16 20 18   Temp: 98.1 F (36.7 C) 98.6 F (37 C) 99.2 F (37.3 C) 97.9 F (36.6 C)  TempSrc:  Oral Oral   SpO2: 100% 100% 98% 93%  Weight:      Height:       General.  Frail lady, in no acute distress. Pulmonary.  Lungs clear bilaterally, normal respiratory effort. CV.  Regular rate and rhythm, no JVD, rub or murmur. Abdomen.  Soft, nontender, nondistended, BS positive. CNS.  Alert and oriented .  No focal neurologic deficit. Extremities.  No edema, no cyanosis, pulses intact and symmetrical.   Data  Reviewed: Prior data reviewed  Family Communication: Discussed with patient  Disposition: Status is: Inpatient Remains inpatient appropriate because: Severity of illness  Planned Discharge Destination: Home  DVT prophylaxis.  Xarelto Time spent: 50 minutes  This record has been created using Conservation officer, historic buildings. Errors have been sought and corrected,but may not always be located. Such creation errors do not reflect on the standard of care.   Author: Arnetha Courser, MD 08/18/2023 3:58 PM  For on call review [email or web address].

## 2023-08-19 ENCOUNTER — Other Ambulatory Visit: Payer: Self-pay

## 2023-08-19 ENCOUNTER — Encounter: Payer: Self-pay | Admitting: Internal Medicine

## 2023-08-19 ENCOUNTER — Encounter
Admission: EM | Disposition: A | Discharge status: Home / Self Care | Payer: Self-pay | Source: Home / Self Care | Attending: Internal Medicine

## 2023-08-19 DIAGNOSIS — E1110 Type 2 diabetes mellitus with ketoacidosis without coma: Secondary | ICD-10-CM | POA: Diagnosis not present

## 2023-08-19 DIAGNOSIS — E1010 Type 1 diabetes mellitus with ketoacidosis without coma: Secondary | ICD-10-CM | POA: Diagnosis not present

## 2023-08-19 DIAGNOSIS — L089 Local infection of the skin and subcutaneous tissue, unspecified: Secondary | ICD-10-CM | POA: Diagnosis not present

## 2023-08-19 DIAGNOSIS — R7881 Bacteremia: Secondary | ICD-10-CM | POA: Diagnosis not present

## 2023-08-19 DIAGNOSIS — B9562 Methicillin resistant Staphylococcus aureus infection as the cause of diseases classified elsewhere: Secondary | ICD-10-CM | POA: Diagnosis not present

## 2023-08-19 HISTORY — PX: TEE WITHOUT CARDIOVERSION: SHX5443

## 2023-08-19 LAB — GLUCOSE, CAPILLARY
Glucose-Capillary: 408 mg/dL — ABNORMAL HIGH (ref 70–99)
Glucose-Capillary: 424 mg/dL — ABNORMAL HIGH (ref 70–99)
Glucose-Capillary: 298 mg/dL — ABNORMAL HIGH (ref 70–99)
Glucose-Capillary: 182 mg/dL — ABNORMAL HIGH (ref 70–99)
Glucose-Capillary: 158 mg/dL — ABNORMAL HIGH (ref 70–99)

## 2023-08-19 LAB — CREATININE, SERUM
Creatinine, Ser: 0.72 mg/dL (ref 0.44–1.00)
GFR, Estimated: >60 mL/min (ref >60)

## 2023-08-19 LAB — VANCOMYCIN, TROUGH: Vancomycin Tr: 11 ug/mL — ABNORMAL LOW (ref 15–20)

## 2023-08-19 LAB — VANCOMYCIN, PEAK: Vancomycin Pk: 31 ug/mL (ref 30–40)

## 2023-08-19 SURGERY — ECHOCARDIOGRAM, TRANSESOPHAGEAL
Anesthesia: Moderate Sedation

## 2023-08-19 SURGERY — AMPUTATION, TOE
Anesthesia: Choice | Site: Toe | Laterality: Right

## 2023-08-19 MED ORDER — LINEZOLID 600 MG PO TABS
600.0000 mg | ORAL_TABLET | Freq: Two times a day (BID) | ORAL | 0 refills | Status: AC
  Filled 2023-08-19: qty 2, 1d supply, fill #0
  Filled 2023-08-19: qty 40, 20d supply, fill #0

## 2023-08-19 MED ORDER — NOVOLOG FLEXPEN 100 UNIT/ML ~~LOC~~ SOPN
15.0000 [IU] | PEN_INJECTOR | Freq: Three times a day (TID) | SUBCUTANEOUS | 11 refills | Status: DC
  Filled 2023-08-19: qty 15, 33d supply, fill #0

## 2023-08-19 MED ORDER — BUTAMBEN-TETRACAINE-BENZOCAINE 2-2-14 % EX AERO
INHALATION_SPRAY | CUTANEOUS | Status: AC
  Filled 2023-08-19: qty 5

## 2023-08-19 MED ORDER — LIDOCAINE VISCOUS HCL 2 % MT SOLN
OROMUCOSAL | Status: AC
  Filled 2023-08-19: qty 15

## 2023-08-19 MED ORDER — LANTUS SOLOSTAR 100 UNIT/ML ~~LOC~~ SOPN
20.0000 [IU] | PEN_INJECTOR | Freq: Every day | SUBCUTANEOUS | 1 refills | Status: DC
  Filled 2023-08-19 (×2): qty 3, 15d supply, fill #0

## 2023-08-19 MED ORDER — INSULIN GLARGINE-YFGN 100 UNIT/ML ~~LOC~~ SOLN
20.0000 [IU] | Freq: Two times a day (BID) | SUBCUTANEOUS | Status: DC
  Administered 2023-08-19: 20 [IU] via SUBCUTANEOUS
  Filled 2023-08-19 (×2): qty 0.2

## 2023-08-19 NOTE — Progress Notes (Signed)
   08/19/23 1330  Spiritual Encounters  Type of Visit Initial  Care provided to: Patient  Conversation partners present during encounter Nurse  Referral source Nurse (RN/NT/LPN)  Reason for visit Surgical  OnCall Visit No  Spiritual Framework  Presenting Themes Impactful experiences and emotions  Patient Stress Factors Major life changes;Health changes  Family Stress Factors None identified  Spiritual Care Plan  Spiritual Care Issues Still Outstanding Referring to oncoming chaplain for further support   Chaplain visited patient per the request of the Nurse because patient was concerned about the recommendation of the staff for her healthcare.  Patient wants to leave the hospital AMA.  Rev. Rana M. Earlene Plater, MDiv. Chaplain Resident  [facility]

## 2023-08-19 NOTE — Progress Notes (Signed)
Date of Admission:  08/15/2023      ID: Michaela Johnson is a 63 y.o. female  Principal Problem:   DKA (diabetic ketoacidosis) (HCC) Active Problems:   Seizure (HCC)   PAF (paroxysmal atrial fibrillation) (HCC)   Substance abuse (HCC)   Chronic anticoagulation   Chronic alcohol use   Nicotine dependence   Hypertension associated with diabetes (HCC)   Depression, major, single episode, complete remission (HCC) dx'd 15 years ago   Cirrhosis of liver (HCC)   Metabolic acidosis   Leukocytosis   Head lice   Uncontrolled type 2 diabetes mellitus with hyperglycemia, with long-term current use of insulin (HCC)   Toe ulcer (HCC)   Hyponatremia   High anion gap metabolic acidosis   Intractable nausea and vomiting   Alopecia of scalp   Cellulitis of scalp   Cellulitis of right toe   AKI (acute kidney injury) (HCC)   Elevated troponin   MRSA bacteremia   Acute osteomyelitis of toe, right (HCC)    Subjective: Pt wants to go home 'she does not want to get TEE or any surgery for the toe infection Prefers to take oral antibiotic  Medications:   Chlorhexidine Gluconate Cloth  6 each Topical Q0600   gabapentin  900 mg Oral QHS   insulin aspart  0-15 Units Subcutaneous TID WC   insulin aspart  0-5 Units Subcutaneous QHS   insulin aspart  6 Units Subcutaneous TID WC   insulin glargine-yfgn  20 Units Subcutaneous BID   mupirocin ointment  1 Application Nasal BID   pantoprazole  40 mg Oral Daily   rivaroxaban  20 mg Oral Q supper    Objective: Vital signs in last 24 hours: Patient Vitals for the past 24 hrs:  BP Temp Temp src Pulse Resp SpO2  08/19/23 0745 118/63 (!) 101 F (38.3 C) Oral 92 16 97 %  08/18/23 2201 130/81 99.3 F (37.4 C) -- 84 17 98 %  08/18/23 1824 (!) 143/82 98.8 F (37.1 C) Oral 89 16 100 %     PHYSICAL EXAM:  General: Alert, cooperative, no distress, appears stated age. Says she did not experience any fever Lungs: Clear to auscultation bilaterally. No  Wheezing or Rhonchi. No rales. Heart: irregular. Abdomen: Soft, non-tender,not distended. Bowel sounds normal. No masses Extremities: rt 3/4 toe infection foot evaluated - superficial eschar Skin: No rashes or lesions. Or bruising Lymph: Cervical, supraclavicular normal. Neurologic: Grossly non-focal  Lab Results    Latest Ref Rng & Units 08/18/2023    4:58 AM 08/17/2023    1:00 AM 08/16/2023    3:36 AM  CBC  WBC 4.0 - 10.5 K/uL 7.9  12.3  13.1   Hemoglobin 12.0 - 15.0 g/dL 96.0  45.4  09.8   Hematocrit 36.0 - 46.0 % 37.0  33.1  32.6   Platelets 150 - 400 K/uL 321  328  381        Latest Ref Rng & Units 08/19/2023    3:01 AM 08/18/2023    4:58 AM 08/17/2023    1:05 AM  CMP  Glucose 70 - 99 mg/dL  119  147   BUN 8 - 23 mg/dL  14  15   Creatinine 8.29 - 1.00 mg/dL 5.62  1.30  8.65   Sodium 135 - 145 mmol/L  136  135   Potassium 3.5 - 5.1 mmol/L  3.6  3.6   Chloride 98 - 111 mmol/L  98  100   CO2 22 -  32 mmol/L  29  25   Calcium 8.9 - 10.3 mg/dL  8.5  8.1       Microbiology:  Studies/Results: US ARTERIAL ABI (SCREENING LOWER EXTREMITY) Result Date: 08/18/2023 CLINICAL DATA:  Diabetic ulcers and possible osteomyelitis of right toes. EXAM: NONINVASIVE PHYSIOLOGIC VASCULAR STUDY OF BILATERAL LOWER EXTREMITIES TECHNIQUE: Evaluation of both lower extremities were performed at rest, including calculation of ankle-brachial indices with single level Doppler, pressure and pulse volume recording. COMPARISON:  None Available. FINDINGS: Right ABI:  1.05 Left ABI:  1.12 Right Lower Extremity: Biphasic/near triphasic posterior tibial and triphasic dorsalis pedis waveforms. Left Lower Extremity: Biphasic posterior tibial and triphasic dorsalis pedis waveforms. 1.0-1.4 Normal IMPRESSION: Normal resting bilateral ankle-brachial indices. Distal waveforms are essentially normal/near normal. Electronically Signed   By: Irish Lack M.D.   On: 08/18/2023 10:34     Assessment/Plan: Uncontrolled  diabetes mellitus with DKA Being treated with fluids and insulin as per primary team   MRSA bacteremia Source right toe ulcer.  Culture MRSA Scap wound MRSA Patient is currently on vancomycin Repeat blood culture is negative She needs TEE- she refused Leucocytosis has resolved   Right toe infected wound MRSA MRI was done and there is marrow edema involving 4th toe, and great toe Podiatrists recommended amputation She refused   Scalp wound- due to MRSA- culture sent   Recent nasal abscess due to MRSA   Cocaine and alcohol use Cocaine positive UDS       Aki has resolved   Hyponatremia corrected  Pt says she does not want TEE or surgical intervention rt foot She wants to go home ASAP-  she knows that her work up is not complete and she knows that she is leaving against medical advise as she is not medically ready We will give her antibiotics and other meds so that she does not get ill   Will give linezolid 600mg  Po BID for 3 weeks- With close monitoring of labs . Explained to her the side effects of linezolid and food and meds ot avoid and to watch put for symptoms of serotonin syndrome She has ana ppt with me for next Thursday and she has assured me that she will keep the appt Discussed in great detail with the patient and the care team Meds obtained from our pharmacy

## 2023-08-19 NOTE — Progress Notes (Addendum)
1610 Pt dressed, IV removed, meds delivered at bedside, Friend notified. Pt ready for d/c taxi called.

## 2023-08-19 NOTE — Plan of Care (Signed)
Saw patient briefly this AM  She states she doesn't want any surgery done to her foot or her heart. Just wants breakfast.  She also says doesn't want any amputation of toes done later this admission.  Recommend local wound care with betadine pain and aquacel Ag dressing in between the toes affected on right foot. Wrap with kerlix and ace wrap. Post op shoe to right foot.  Follow up with wound care center or can call to get into our [place] office as desired, we will again recommend amputation of the affected toes at that time and if she is ready to proceed could be done on outpatient basis.  Will sign off at this time, please let me know if patient changes her mind.        Corinna Gab, DPM [facility] / [facility]                   08/19/2023

## 2023-08-19 NOTE — Plan of Care (Signed)
Pt dc AMA at this time.All pertinent instructions,medications,education reviewed with the patient.Attending MD and MD cardiology as well as MD podiatry talked to the patient respectively.Pt expressed she wants to leave AMA.Pt reported she doesn't have a legal guardian and case manager also confirmed Pt has no legal guardian.Pt was transported home in a commercial vehicle with voucher.

## 2023-08-19 NOTE — Discharge Summary (Signed)
[facility] Discharge Summary    Patient: Michaela Johnson BJY:[id number]  PCP: Sandrea Hughs, NP  Date of admission: 08/15/2023   Date of discharge:  08/19/2023     Discharge Diagnoses:  Principal Problem:   DKA (diabetic ketoacidosis) (HCC) Active Problems:   Uncontrolled type 2 diabetes mellitus with hyperglycemia, with long-term current use of insulin (HCC)   MRSA bacteremia   Toe ulcer (HCC)   Leukocytosis   PAF (paroxysmal atrial fibrillation) (HCC)   Hypertension associated with diabetes (HCC)   AKI (acute kidney injury) (HCC)   Cellulitis of scalp   Chronic anticoagulation   Hyponatremia   Elevated troponin   Seizure (HCC)   Intractable nausea and vomiting   Head lice   High anion gap metabolic acidosis   Chronic alcohol use   Nicotine dependence   Substance abuse (HCC)   Depression, major, single episode, complete remission (HCC) dx'd 15 years ago   Cirrhosis of liver (HCC)   Metabolic acidosis   Alopecia of scalp   Cellulitis of right toe   Acute osteomyelitis of toe, right (HCC)   Admitted From: Home Disposition:  Home, patient refused TEE and refused surgical intervention for right toe osteomyelitis. Patient's blood sugar is fluctuating, seems noncompliant so I decided to let patient sign AMA and leave because she remains at high risk for readmission. After discussion with ID we decided to discharge her on oral antibiotics Zyvox for 3 weeks and prescribed insulin as well. Pharmacy was consulted to deliver meds to bed.  Recommendations for Outpatient Follow-up:  F/u with PCP in 1 week, monitor CBG at home and follow with PCP to titrate dose accordingly. Follow-up with ID in 1 week Follow-up with podiatry if no improvement in the toe infection.  Patient needs amputation but she refused. She also refused TEE to rule out endocarditis. As per ID patient wants to leave, she can be discharged on oral antibiotics and she needs insulin refill which will be sent to  [facility] pharmacy.  Patient is noncompliant, does not understand well her medical condition and remains at high risk for readmission. Follow up LABS/TEST: As above   Follow-up Information     Sandrea Hughs, NP Follow up in 1 week(s).   Specialty: Nurse Practitioner Contact information: [address] [place] [address] [phone]         Lynn Ito, MD Follow up in 1 week(s).   Specialty: Infectious Diseases Contact information: [address] [place] [address] [phone]                Diet recommendation: Cardiac and Carb modified diet  Activity: The patient is advised to gradually reintroduce usual activities, as tolerated  Discharge Condition: stable  Code Status: Full code   History of present illness: As per the H and P dictated on admission. Hospital Course:  Michaela Johnson is a 63 year old female with history of insulin-dependent diabetes mellitus, GERD, atrial fibrillation on Xarelto, neuropathy, who presents emergency department for chief concerns of nausea vomiting since 6 AM on day of admission.   Vitals in the ED temperature 98.6, respiration 18, heart rate of 118, blood pressure 111/69, SpO2 100% on room air.   Serum sodium is 130, potassium 5.7, chloride 89, bicarb 10, nonfasting blood glucose 599,  BUN of 38, serum creatinine 1.27, EGFR 47, T. bili elevated at 2.0.  WBC 14.7, hemoglobin 14.3, platelets of 382.  troponin is 14.  COVID/influenza A/influenza B/RSV PCR were negative.   ED  treatment: Sodium chloride 3 L bolus, insulin per endotool, ondansetron 4 mg IV one-time dose.  Assessment and plan # DKA (diabetic ketoacidosis), IDDM T2 with hyperglycemia  Patient presented with DKA, was not using insulin for the past few days.  S/p Endo tool along with bicarb infusion due to significant metabolic acidosis. DKA resolved and she was started on basal and short-acting. A1c of 9.8 Continue with Semglee 10 units  twice daily, insulin NovoLog 6 units mealtime and sliding scale.  Patient was advised to monitor CBG at home, continue diabetic diet and follow with PCP for further management.  # Right great toe osteomyelitis. MRSA bacteremia Blood cultures growing MRSA.  History of nasal abscess which grew MRSA few months ago which was treated with appropriately.   ID is on board Scalp and foot ulcer cultures are also growing MRSA Echocardiogram with no mention of vegetation will need TEE S/p vancomycin.  Patient refused TEE, cardiology signed off. Podiatry was consulted, patient refused surgical intervention.  Podiatry recommended continue wound dressing and follow-up as an outpatient. Patient was discharged on Zyvox 600 mg p.o. twice daily for 3 weeks, weekly labs and follow-up with ID as an outpatient.   # PAF (paroxysmal atrial fibrillation): Continue Xarelto # Hypertension associated with diabetes: Blood pressure currently within goal.  Not on any antihypertensives at home. # AKI (acute kidney injury): Secondary to DKA, high anion gap metabolic acidosis. S/p IVF. AKI resolved # Cellulitis of scalp: In setting of uncontrolled insulin-dependent diabetes mellitus. Cultures are growing MRSA. S/p vancomycin, switched to Zyvox as above. # Hyponatremia: Pseudohyponatremia secondary to hyperosmolar hyperglycemia. Serum corrected sodium is 142,  # Elevated troponin: Secondary to demand ischemia, flat curve, continue current medical treatment per above # Seizure: Not on any medication at home. Seizure precaution. Ativan 2 mg IV as needed as needed for anxiety, seizure,  # Intractable nausea and vomiting due to DKA, resolved, patient is tolerating diet well. # Nicotine dependence: Smoking cessation counseling done. S/p nicotine patch # Chronic alcohol use: s/p Ativan prn, an d CIWA protocol.  Body mass index is 21.09 kg/m.  Nutrition Interventions:  Patient wanted to be discharged today, she refused TEE and  surgical intervention for the right osteomyelitis.  Patient was willing to sign AMA but after discussion with ID patient was discharged and prescriptions given.  Consultants: ID and podiatry Procedures: None  Discharge Exam: General: Appear in no distress, no Rash; Oral Mucosa Clear, moist. Cardiovascular: S1 and S2 Present, no Murmur, Respiratory: normal respiratory effort, Bilateral Air entry present and no Crackles, no wheezes Abdomen: Bowel Sound present, Soft and no tenderness, no hernia Extremities: no Pedal edema, no calf tenderness, right toe osteomyelitis Neurology: CN grossly intact, no focal deficit. affect appropriate.  Filed Weights   08/15/23 1241 08/15/23 1718  Weight: 49.9 kg 52.3 kg   Vitals:   08/18/23 2201 08/19/23 0745  BP: 130/81 118/63  Pulse: 84 92  Resp: 17 16  Temp: 99.3 F (37.4 C) (!) 101 F (38.3 C)  SpO2: 98% 97%    DISCHARGE MEDICATION: Allergies as of 08/19/2023       Reactions   Glipizide    Other reaction(s): Other (See Comments)   Metformin Diarrhea   Codeine Itching   Levaquin [levofloxacin In D5w] Itching        Medication List     TAKE these medications    gabapentin 300 MG capsule Commonly known as: NEURONTIN Take 900 mg by mouth at bedtime.   Jardiance 25  MG Tabs tablet Generic drug: empagliflozin Take 25 mg by mouth daily.   Lantus SoloStar 100 UNIT/ML Solostar Pen Generic drug: insulin glargine Inject 20 Units into the skin daily. This is your home dose of long-acting insulin.   linezolid 600 MG tablet Commonly known as: Zyvox Take 1 tablet (600 mg total) by mouth 2 (two) times daily for 21 days.   metoprolol succinate 25 MG 24 hr tablet Commonly known as: TOPROL-XL Take 1 tablet (25 mg total) by mouth daily. Take with or immediately following a meal.   multivitamin with minerals Tabs tablet Take 1 tablet by mouth daily.   Natroba 0.9 % Susp Generic drug: Spinosad Apply topically.   nicotine 21 mg/24hr  patch Commonly known as: NICODERM CQ - dosed in mg/24 hours Place 1 patch (21 mg total) onto the skin daily as needed (nicotine craving).   NovoLOG FlexPen 100 UNIT/ML FlexPen Generic drug: insulin aspart Inject 15 Units into the skin 3 (three) times daily with meals.   omeprazole 20 MG capsule Commonly known as: PRILOSEC Take 20 mg by mouth daily.   Ozempic (2 MG/DOSE) 8 MG/3ML Sopn Generic drug: Semaglutide (2 MG/DOSE) Inject 2 mg into the skin once a week. Sunday   rivaroxaban 20 MG Tabs tablet Commonly known as: XARELTO Take 1 tablet (20 mg total) by mouth daily with supper.   Victoza 18 MG/3ML Sopn Generic drug: liraglutide Inject 4 mg into the skin daily.   Vitamin D 125 MCG (5000 UT) Caps Take 1 capsule by mouth daily.               Discharge Care Instructions  (From admission, onward)           Start     Ordered   08/19/23 0000  Discharge wound care:       Comments: As per podiatry   08/19/23 1431           Allergies  Allergen Reactions   Glipizide     Other reaction(s): Other (See Comments)   Metformin Diarrhea   Codeine Itching   Levaquin [Levofloxacin In D5w] Itching   Discharge Instructions     Call MD for:  difficulty breathing, headache or visual disturbances   Complete by: As directed    Call MD for:  extreme fatigue   Complete by: As directed    Call MD for:  persistant dizziness or light-headedness   Complete by: As directed    Call MD for:  persistant nausea and vomiting   Complete by: As directed    Call MD for:  severe uncontrolled pain   Complete by: As directed    Call MD for:  temperature >100.4   Complete by: As directed    Diet - low sodium heart healthy   Complete by: As directed    Discharge instructions   Complete by: As directed    F/u with PCP in 1 week, monitor CBG at home and follow with PCP to titrate dose accordingly. Follow-up with ID in 1 week Follow-up with podiatry if no improvement in the toe  infection.  Patient needs amputation but she refused. She also refused TEE to rule out endocarditis. As per ID patient wants to leave, she can be discharged on oral antibiotics and she needs insulin refill which will be sent to [facility] pharmacy.  Patient is noncompliant, does not understand well her medical condition and remains at high risk for readmission.   Discharge wound care:   Complete by: As  directed    As per podiatry   Increase activity slowly   Complete by: As directed        The results of significant diagnostics from this hospitalization (including imaging, microbiology, ancillary and laboratory) are listed below for reference.    Significant Diagnostic Studies: US ARTERIAL ABI (SCREENING LOWER EXTREMITY) Result Date: 08/18/2023 CLINICAL DATA:  Diabetic ulcers and possible osteomyelitis of right toes. EXAM: NONINVASIVE PHYSIOLOGIC VASCULAR STUDY OF BILATERAL LOWER EXTREMITIES TECHNIQUE: Evaluation of both lower extremities were performed at rest, including calculation of ankle-brachial indices with single level Doppler, pressure and pulse volume recording. COMPARISON:  None Available. FINDINGS: Right ABI:  1.05 Left ABI:  1.12 Right Lower Extremity: Biphasic/near triphasic posterior tibial and triphasic dorsalis pedis waveforms. Left Lower Extremity: Biphasic posterior tibial and triphasic dorsalis pedis waveforms. 1.0-1.4 Normal IMPRESSION: Normal resting bilateral ankle-brachial indices. Distal waveforms are essentially normal/near normal. Electronically Signed   By: Irish Lack M.D.   On: 08/18/2023 10:34   ECHOCARDIOGRAM COMPLETE Result Date: 08/17/2023    ECHOCARDIOGRAM REPORT   Patient Name:   Michaela Johnson Date of Exam: 08/16/2023 Medical Rec #:  [id number]     Height:       62.0 in Accession #:    [id number]    Weight:       115.3 lb Date of Birth:  1961-01-28     BSA:          1.513 m Patient Age:    62 years      BP:           103/40 mmHg Patient Gender: F             HR:            104 bpm. Exam Location:  [facility] Procedure: 2D Echo, Cardiac Doppler and Color Doppler Indications:     Bacteremia R78.81  History:         Patient has prior history of Echocardiogram examinations, most                  recent 01/03/2022. Arrythmias:Atrial Fibrillation; Risk                  Factors:Hypertension, Diabetes and Current Smoker.  Sonographer:     Lucendia Herrlich RCS Referring Phys:  [id number] QIONGEX AMIN Diagnosing Phys: Chilton Si MD IMPRESSIONS  1. Overall systolic funciton is preserved, however septal function is abnormal consistent with bundle branch block. Left ventricular ejection fraction, by estimation, is 55%. The left ventricle has normal function. The left ventricle demonstrates regional wall motion abnormalities (see scoring diagram/findings for description). Left ventricular diastolic parameters are consistent with Grade I diastolic dysfunction (impaired relaxation).  2. Right ventricular systolic function is normal. The right ventricular size is normal. There is normal pulmonary artery systolic pressure.  3. The mitral valve is normal in structure. No evidence of mitral valve regurgitation. No evidence of mitral stenosis.  4. The aortic valve is tricuspid. Aortic valve regurgitation is not visualized. No aortic stenosis is present.  5. The inferior vena cava is normal in size with greater than 50% respiratory variability, suggesting right atrial pressure of 3 mmHg. FINDINGS  Left Ventricle: Overall systolic funciton is preserved, however septal function is abnormal consistent with bundle branch block. Left ventricular ejection fraction, by estimation, is 55%. The left ventricle has normal function. The left ventricle demonstrates regional wall motion abnormalities. The left ventricular internal cavity size was normal in size. There is no left ventricular hypertrophy.  Abnormal (paradoxical) septal motion, consistent with left bundle branch block. Left ventricular diastolic  parameters are consistent with Grade I diastolic dysfunction (impaired relaxation). Indeterminate filling pressures.  LV Wall Scoring: The entire septum is hypokinetic. The entire anterior wall, entire lateral wall, entire inferior wall, and apex are normal. Right Ventricle: The right ventricular size is normal. No increase in right ventricular wall thickness. Right ventricular systolic function is normal. There is normal pulmonary artery systolic pressure. The tricuspid regurgitant velocity is 1.47 m/s, and  with an assumed right atrial pressure of 3 mmHg, the estimated right ventricular systolic pressure is 11.6 mmHg. Left Atrium: Left atrial size was normal in size. Right Atrium: Right atrial size was normal in size. Pericardium: There is no evidence of pericardial effusion. Mitral Valve: The mitral valve is normal in structure. No evidence of mitral valve regurgitation. No evidence of mitral valve stenosis. Tricuspid Valve: The tricuspid valve is normal in structure. Tricuspid valve regurgitation is trivial. No evidence of tricuspid stenosis. Aortic Valve: The aortic valve is tricuspid. Aortic valve regurgitation is not visualized. No aortic stenosis is present. Aortic valve peak gradient measures 8.5 mmHg. Pulmonic Valve: The pulmonic valve was normal in structure. Pulmonic valve regurgitation is not visualized. No evidence of pulmonic stenosis. Aorta: The aortic root is normal in size and structure. Venous: The inferior vena cava is normal in size with greater than 50% respiratory variability, suggesting right atrial pressure of 3 mmHg. IAS/Shunts: No atrial level shunt detected by color flow Doppler.  LEFT VENTRICLE PLAX 2D LVIDd:         3.60 cm   Diastology LVIDs:         2.60 cm   LV e' medial:    7.29 cm/s LV PW:         0.80 cm   LV E/e' medial:  12.9 LV IVS:        0.80 cm   LV e' lateral:   15.30 cm/s LVOT diam:     2.00 cm   LV E/e' lateral: 6.1 LV SV:         43 LV SV Index:   28 LVOT Area:     3.14  cm                           3D Volume EF:                          3D EF:        55 % RIGHT VENTRICLE             IVC RV S prime:     13.10 cm/s  IVC diam: 2.00 cm TAPSE (M-mode): 2.0 cm LEFT ATRIUM             Index        RIGHT ATRIUM           Index LA diam:        3.30 cm 2.18 cm/m   RA Area:     13.50 cm LA Vol (A2C):   34.2 ml 22.61 ml/m  RA Volume:   34.20 ml  22.61 ml/m LA Vol (A4C):   41.5 ml 27.43 ml/m LA Biplane Vol: 39.7 ml 26.24 ml/m  AORTIC VALVE AV Area (Vmax): 1.80 cm AV Vmax:        146.00 cm/s AV Peak Grad:   8.5 mmHg LVOT Vmax:  83.80 cm/s LVOT Vmean:     58.700 cm/s LVOT VTI:       0.137 m  AORTA Ao Root diam: 3.10 cm Ao Asc diam:  3.00 cm MITRAL VALVE                TRICUSPID VALVE MV Area (PHT): 4.96 cm     TR Peak grad:   8.6 mmHg MV Decel Time: 153 msec     TR Vmax:        147.00 cm/s MV E velocity: 93.80 cm/s MV A velocity: 113.00 cm/s  SHUNTS MV E/A ratio:  0.83         Systemic VTI:  0.14 m                             Systemic Diam: 2.00 cm Chilton Si MD Electronically signed by Chilton Si MD Signature Date/Time: 08/17/2023/7:55:42 AM    Final (Updated)    MR FOOT RIGHT WO CONTRAST Result Date: 08/16/2023 CLINICAL DATA:  Foot swelling. Diabetes. Osteomyelitis suspected. Wound to third and fourth toes. EXAM: MRI OF THE RIGHT FOREFOOT WITHOUT CONTRAST TECHNIQUE: Multiplanar, multisequence MR imaging of the right forefoot was performed. No intravenous contrast was administered. COMPARISON:  Right forefoot radiographs 08/15/2023 FINDINGS: Despite efforts by the technologist and patient, moderate motion artifact is present on today's exam and could not be eliminated. This reduces exam sensitivity and specificity. Bones/Joint/Cartilage There is moderate marrow edema within the distal 2/3 of the distal phalanx of the great toe. There is moderate marrow edema within the shaft and distal aspect of the proximal phalanx of the fourth toe (sagittal series 7, image 14 and  coronal series 8, image 12) and adjacent proximal aspect of the middle phalanx of the fourth toe (sagittal series image 13). On a single sagittal STIR image there is question of marrow edema within the lateral aspect of the proximal phalanx of third toe (sagittal series 7, image 17), however normal signal is seen 1 slice more medially within the proximal phalanx (sagittal series 7, image 18), and no abnormal signal is seen in this region on coronal or axial images. Possible trace marrow edema within the distal phalanx of the fifth toe (sagittal series 7, image 10). Mild to moderate great toe metatarsophalangeal cartilage thinning and subchondral cystic change. There is transverse linear decreased T1 increased T2 signal within the fourth metatarsal neck suspicious for a fracture. There is no significant surrounding marrow edema (sagittal series 7, images 15 and 16, coronal series 5 images 17 and 18, coronal series 8 images 14 through 16), and this is likely subacute. Ligaments The Lisfranc ligament complex appears intact. The metatarsophalangeal collateral ligaments appear grossly intact, within limitation of motion artifact. Muscles and Tendons Mild edema within the intrinsic plantar greater than dorsal midfoot to forefoot musculature. No flexor or extensor tendon tear is seen. Soft tissues Reportedly the patient has a wound of the third and fourth toes. There is mild soft tissue edema at the plantar aspect of the distal third through fifth toes. There appears to be thinning of the skin dorsal to the distal aspect of the proximal phalanx and the proximal aspect of the middle phalanx of the fourth toe (sagittal series 7, image 14), suspicious for a wound. IMPRESSION: 1. Moderate marrow edema within the distal 2/3 of the distal phalanx of the great toe. Recommend clinical correlation for any soft tissue infection concerns within the great toe. The marrow edema  is nonspecific and may be reactive, however it is  difficult to exclude early osteomyelitis. 2. Moderate marrow edema within the shaft and distal aspect of the proximal phalanx of the fourth toe and adjacent proximal aspect of the middle phalanx of the fourth toe. There appears to be thinning of the skin dorsal to the distal aspect of the proximal phalanx and proximal aspect of the middle phalanx of the fourth toe, reportedly there is a fourth toe wound. Findings are suspicious for early osteomyelitis. 3. Possible trace marrow edema within the distal phalanx of the fifth toe. This is nonspecific and could be reactive or represent early osteomyelitis. Recommend clinical correlation 4. Transverse linear decreased T1 increased T2 signal within the fourth metatarsal neck suspicious for a nondisplaced fracture. There is no significant surrounding marrow edema, and this is likely subacute. Electronically Signed   By: Neita Garnet M.D.   On: 08/16/2023 13:32   DG Foot 2 Views Right Result Date: 08/15/2023 CLINICAL DATA:  Wound to the third and fourth toes EXAM: RIGHT FOOT - 2 VIEW COMPARISON:  None Available. FINDINGS: Partially imaged postsurgical changes of the tibia and hindfoot. Intramedullary rod extends beyond the plantar calcaneus. Screw traversing the calcaneus is fractured. There is no evidence of fracture or dislocation. Diffuse osteopenia and severe degenerative changes of the ankle and hindfoot with collapse of the calcaneus. Mild soft tissue swelling of the great, third, and fourth toes. IMPRESSION: 1. Soft tissue swelling of the first, third, and fourth toes. No radiographic evidence of osteomyelitis. 2. Diffuse osteopenia and severe degenerative changes of the ankle and hindfoot with collapse of the calcaneus. Electronically Signed   By: Agustin Cree M.D.   On: 08/15/2023 16:14   Portable chest x-ray (1 view) Result Date: 08/15/2023 CLINICAL DATA:  Diabetic ketoacidosis.  Nausea and vomiting. EXAM: PORTABLE CHEST 1 VIEW COMPARISON:  Chest radiograph  dated June 16, 2022. FINDINGS: The heart size and mediastinal contours are within normal limits. Aortic atherosclerosis. No focal consolidation, pleural effusion, or pneumothorax. Similar mild chronic reticular changes at the lung bases. Remote right-sided rib fractures. Chronic posttraumatic deformity of the left clavicle. No acute osseous abnormality identified. IMPRESSION: No acute cardiopulmonary findings. Electronically Signed   By: Hart Robinsons M.D.   On: 08/15/2023 14:34    Microbiology: Recent Results (from the past 240 hours)  Resp panel by RT-PCR (RSV, Flu A&B, Covid) Anterior Nasal Swab     Status: None   Collection Time: 08/15/23 12:31 PM   Specimen: Anterior Nasal Swab  Result Value Ref Range Status   SARS Coronavirus 2 by RT PCR NEGATIVE NEGATIVE Final    Comment: (NOTE) SARS-CoV-2 target nucleic acids are NOT DETECTED.  The SARS-CoV-2 RNA is generally detectable in upper respiratory specimens during the acute phase of infection. The lowest concentration of SARS-CoV-2 viral copies this assay can detect is 138 copies/mL. A negative result does not preclude SARS-Cov-2 infection and should not be used as the sole basis for treatment or other patient management decisions. A negative result may occur with  improper specimen collection/handling, submission of specimen other than nasopharyngeal swab, presence of viral mutation(s) within the areas targeted by this assay, and inadequate number of viral copies(<138 copies/mL). A negative result must be combined with clinical observations, patient history, and epidemiological information. The expected result is Negative.  Fact Sheet for Patients:  BloggerCourse.com  Fact Sheet for Healthcare Providers:  SeriousBroker.it  This test is no t yet approved or cleared by the [place] FDA  and  has been authorized for detection and/or diagnosis of SARS-CoV-2 by FDA under an  Emergency Use Authorization (EUA). This EUA will remain  in effect (meaning this test can be used) for the duration of the COVID-19 declaration under Section 564(b)(1) of the Act, 21 U.S.C.section 360bbb-3(b)(1), unless the authorization is terminated  or revoked sooner.       Influenza A by PCR NEGATIVE NEGATIVE Final   Influenza B by PCR NEGATIVE NEGATIVE Final    Comment: (NOTE) The Xpert Xpress SARS-CoV-2/FLU/RSV plus assay is intended as an aid in the diagnosis of influenza from Nasopharyngeal swab specimens and should not be used as a sole basis for treatment. Nasal washings and aspirates are unacceptable for Xpert Xpress SARS-CoV-2/FLU/RSV testing.  Fact Sheet for Patients: BloggerCourse.com  Fact Sheet for Healthcare Providers: SeriousBroker.it  This test is not yet approved or cleared by the [place] FDA and has been authorized for detection and/or diagnosis of SARS-CoV-2 by FDA under an Emergency Use Authorization (EUA). This EUA will remain in effect (meaning this test can be used) for the duration of the COVID-19 declaration under Section 564(b)(1) of the Act, 21 U.S.C. section 360bbb-3(b)(1), unless the authorization is terminated or revoked.     Resp Syncytial Virus by PCR NEGATIVE NEGATIVE Final    Comment: (NOTE) Fact Sheet for Patients: BloggerCourse.com  Fact Sheet for Healthcare Providers: SeriousBroker.it  This test is not yet approved or cleared by the [place] FDA and has been authorized for detection and/or diagnosis of SARS-CoV-2 by FDA under an Emergency Use Authorization (EUA). This EUA will remain in effect (meaning this test can be used) for the duration of the COVID-19 declaration under Section 564(b)(1) of the Act, 21 U.S.C. section 360bbb-3(b)(1), unless the authorization is terminated or revoked.  Performed at [facility], [address] [place], [place] [address]   Culture, blood (Routine X 2) w Reflex to ID Panel     Status: Abnormal (Preliminary result)   Collection Time: 08/15/23  4:04 PM   Specimen: BLOOD  Result Value Ref Range Status   Specimen Description   Final    BLOOD BLOOD LEFT WRIST Performed at [facility], [address], [place], [place] [address]    Special Requests   Final    BOTTLES DRAWN AEROBIC AND ANAEROBIC Blood Culture results may not be optimal due to an inadequate volume of blood received in culture bottles Performed at [facility], [address] [place], [place] [address]    Culture  Setup Time   Final    GRAM POSITIVE COCCI AEROBIC BOTTLE ONLY CRITICAL RESULT CALLED TO, READ BACK BY AND VERIFIED WITH: ERIN [facility] 08/16/2023 AT 1032 SRR GRAM STAIN REVIEWED-AGREE WITH RESULT    Culture (A)  Final    METHICILLIN RESISTANT STAPHYLOCOCCUS AUREUS Sent to [facility] for further susceptibility testing. Performed at [facility], [address] [place], [place] [address]    Report Status PENDING  Incomplete   Organism ID, Bacteria METHICILLIN RESISTANT STAPHYLOCOCCUS AUREUS  Final      Susceptibility   Methicillin resistant staphylococcus aureus - MIC*    CIPROFLOXACIN >=8 RESISTANT Resistant     ERYTHROMYCIN >=8 RESISTANT Resistant     GENTAMICIN <=0.5 SENSITIVE Sensitive     OXACILLIN >=4 RESISTANT Resistant     TETRACYCLINE <=1 SENSITIVE Sensitive     VANCOMYCIN 1 SENSITIVE Sensitive     TRIMETH/SULFA >=320 RESISTANT Resistant     CLINDAMYCIN <=0.25 SENSITIVE Sensitive  RIFAMPIN <=0.5 SENSITIVE Sensitive     Inducible Clindamycin NEGATIVE Sensitive     LINEZOLID 2 SENSITIVE Sensitive     * METHICILLIN RESISTANT STAPHYLOCOCCUS AUREUS  Blood Culture ID Panel (Reflexed)     Status: Abnormal   Collection Time: 08/15/23  4:04 PM  Result Value Ref Range Status   Enterococcus faecalis NOT DETECTED NOT DETECTED Final   Enterococcus  Faecium NOT DETECTED NOT DETECTED Final   Listeria monocytogenes NOT DETECTED NOT DETECTED Final   Staphylococcus species DETECTED (A) NOT DETECTED Final    Comment: CRITICAL RESULT CALLED TO, READ BACK BY AND VERIFIED WITH: ERIN [facility] 08/16/2023 AT 1032 SRR    Staphylococcus aureus (BCID) DETECTED (A) NOT DETECTED Final    Comment: Methicillin (oxacillin)-resistant Staphylococcus aureus (MRSA). MRSA is predictably resistant to beta-lactam antibiotics (except ceftaroline). Preferred therapy is vancomycin unless clinically contraindicated. Patient requires contact precautions if  hospitalized. CRITICAL RESULT CALLED TO, READ BACK BY AND VERIFIED WITH: ERIN [facility] 08/16/2023 AT 1032 SRR    Staphylococcus epidermidis NOT DETECTED NOT DETECTED Final   Staphylococcus lugdunensis NOT DETECTED NOT DETECTED Final   Streptococcus species NOT DETECTED NOT DETECTED Final   Streptococcus agalactiae NOT DETECTED NOT DETECTED Final   Streptococcus pneumoniae NOT DETECTED NOT DETECTED Final   Streptococcus pyogenes NOT DETECTED NOT DETECTED Final   A.calcoaceticus-baumannii NOT DETECTED NOT DETECTED Final   Bacteroides fragilis NOT DETECTED NOT DETECTED Final   Enterobacterales NOT DETECTED NOT DETECTED Final   Enterobacter cloacae complex NOT DETECTED NOT DETECTED Final   Escherichia coli NOT DETECTED NOT DETECTED Final   Klebsiella aerogenes NOT DETECTED NOT DETECTED Final   Klebsiella oxytoca NOT DETECTED NOT DETECTED Final   Klebsiella pneumoniae NOT DETECTED NOT DETECTED Final   Proteus species NOT DETECTED NOT DETECTED Final   Salmonella species NOT DETECTED NOT DETECTED Final   Serratia marcescens NOT DETECTED NOT DETECTED Final   Haemophilus influenzae NOT DETECTED NOT DETECTED Final   Neisseria meningitidis NOT DETECTED NOT DETECTED Final   Pseudomonas aeruginosa NOT DETECTED NOT DETECTED Final   Stenotrophomonas maltophilia NOT DETECTED NOT DETECTED Final   Candida albicans NOT DETECTED  NOT DETECTED Final   Candida auris NOT DETECTED NOT DETECTED Final   Candida glabrata NOT DETECTED NOT DETECTED Final   Candida krusei NOT DETECTED NOT DETECTED Final   Candida parapsilosis NOT DETECTED NOT DETECTED Final   Candida tropicalis NOT DETECTED NOT DETECTED Final   Cryptococcus neoformans/gattii NOT DETECTED NOT DETECTED Final   Meth resistant mecA/C and MREJ DETECTED (A) NOT DETECTED Final    Comment: CRITICAL RESULT CALLED TO, READ BACK BY AND VERIFIED WITH: ERIN [facility] 08/16/2023 AT 1032 SRR Performed at [facility], [address] [place], [place] [address]   Culture, blood (Routine X 2) w Reflex to ID Panel     Status: None (Preliminary result)   Collection Time: 08/15/23  5:16 PM   Specimen: BLOOD  Result Value Ref Range Status   Specimen Description BLOOD BLOOD LEFT ARM AEROBIC BOTTLE ONLY  Final   Special Requests   Final    BOTTLES DRAWN AEROBIC ONLY Blood Culture results may not be optimal due to an inadequate volume of blood received in culture bottles   Culture   Final    NO GROWTH 4 DAYS Performed at [facility], [address] [place], [place] [address]    Report Status PENDING  Incomplete  MRSA Next Gen by PCR, Nasal     Status: Abnormal   Collection  Time: 08/15/23  5:32 PM   Specimen: Nasal Mucosa; Nasal Swab  Result Value Ref Range Status   MRSA by PCR Next Gen DETECTED (A) NOT DETECTED Final    Comment: CRITICAL RESULT CALLED TO, READ BACK BY AND VERIFIED WITH: Corey Skains RN ICU @ 1856 08/15/23 LFD (NOTE) The GeneXpert MRSA Assay (FDA approved for NASAL specimens only), is one component of a comprehensive MRSA colonization surveillance program. It is not intended to diagnose MRSA infection nor to guide or monitor treatment for MRSA infections. Test performance is not FDA approved in patients less than 4 years old. Performed at [facility], [address] [place], [place] [address]   Aerobic Culture w Gram  Stain (superficial specimen)     Status: None   Collection Time: 08/16/23  1:25 PM   Specimen: Wound  Result Value Ref Range Status   Specimen Description   Final    WOUND Performed at [facility], [address] [place], [place] [address]    Special Requests   Final    TOES Performed at [facility], [address] [place], [place] [address]    Gram Stain   Final    NO WBC SEEN FEW GRAM POSITIVE COCCI Performed at [facility], [address] [place], [place] [address]    Culture   Final    ABUNDANT METHICILLIN RESISTANT STAPHYLOCOCCUS AUREUS   Report Status 08/18/2023 FINAL  Final   Organism ID, Bacteria METHICILLIN RESISTANT STAPHYLOCOCCUS AUREUS  Final      Susceptibility   Methicillin resistant staphylococcus aureus - MIC*    CIPROFLOXACIN 4 RESISTANT Resistant     ERYTHROMYCIN >=8 RESISTANT Resistant     GENTAMICIN 2 SENSITIVE Sensitive     OXACILLIN >=4 RESISTANT Resistant     TETRACYCLINE <=1 SENSITIVE Sensitive     VANCOMYCIN <=0.5 SENSITIVE Sensitive     TRIMETH/SULFA >=320 RESISTANT Resistant     CLINDAMYCIN <=0.25 SENSITIVE Sensitive     RIFAMPIN <=0.5 SENSITIVE Sensitive     Inducible Clindamycin NEGATIVE Sensitive     LINEZOLID 2 SENSITIVE Sensitive     * ABUNDANT METHICILLIN RESISTANT STAPHYLOCOCCUS AUREUS  Aerobic Culture w Gram Stain (superficial specimen)     Status: None   Collection Time: 08/16/23  2:11 PM   Specimen: Wound  Result Value Ref Range Status   Specimen Description   Final    WOUND Performed at [facility], [address] [place], [place] [address]    Special Requests   Final    SCALP Performed at [facility], [address] [place], [place] [address]    Gram Stain   Final    RARE WBC PRESENT, PREDOMINANTLY PMN FEW GRAM POSITIVE COCCI Performed at [facility], [address], [place], [place] [address]    Culture   Final    MODERATE METHICILLIN RESISTANT STAPHYLOCOCCUS  AUREUS   Report Status 08/18/2023 FINAL  Final   Organism ID, Bacteria METHICILLIN RESISTANT STAPHYLOCOCCUS AUREUS  Final      Susceptibility   Methicillin resistant staphylococcus aureus - MIC*    CIPROFLOXACIN >=8 RESISTANT Resistant     ERYTHROMYCIN >=8 RESISTANT Resistant     GENTAMICIN <=0.5 SENSITIVE Sensitive     OXACILLIN >=4 RESISTANT Resistant     TETRACYCLINE <=1 SENSITIVE Sensitive     VANCOMYCIN <=0.5 SENSITIVE Sensitive     TRIMETH/SULFA 160 RESISTANT Resistant     CLINDAMYCIN <=0.25 SENSITIVE Sensitive  RIFAMPIN <=0.5 SENSITIVE Sensitive     Inducible Clindamycin NEGATIVE Sensitive     LINEZOLID 2 SENSITIVE Sensitive     * MODERATE METHICILLIN RESISTANT STAPHYLOCOCCUS AUREUS  Culture, blood (Routine X 2) w Reflex to ID Panel     Status: None (Preliminary result)   Collection Time: 08/18/23  4:58 AM   Specimen: BLOOD RIGHT ARM  Result Value Ref Range Status   Specimen Description BLOOD RIGHT ARM  Final   Special Requests   Final    BOTTLES DRAWN AEROBIC AND ANAEROBIC Blood Culture adequate volume   Culture   Final    NO GROWTH 1 DAY Performed at [facility], [address] [place], [place] [address]    Report Status PENDING  Incomplete  Culture, blood (Routine X 2) w Reflex to ID Panel     Status: None (Preliminary result)   Collection Time: 08/18/23  4:58 AM   Specimen: BLOOD LEFT HAND  Result Value Ref Range Status   Specimen Description BLOOD LEFT HAND  Final   Special Requests   Final    BOTTLES DRAWN AEROBIC AND ANAEROBIC Blood Culture adequate volume   Culture   Final    NO GROWTH 1 DAY Performed at [facility], [address] [place], [place] [address]    Report Status PENDING  Incomplete     Labs: CBC: Recent Labs  Lab 08/15/23 1231 08/16/23 0336 08/17/23 0100 08/18/23 0458  WBC 14.7* 13.1* 12.3* 7.9  NEUTROABS 13.9*  --   --   --   HGB 14.3 11.4* 11.2* 12.6  HCT 44.7 32.6* 33.1* 37.0  MCV 99.6 90.6 93.5 93.7   PLT 382 381 328 321   Basic Metabolic Panel: Recent Labs  Lab 08/16/23 0857 08/16/23 1334 08/16/23 1752 08/17/23 0105 08/18/23 0458 08/19/23 0301  NA 136 135 134* 135 136  --   K 3.8 3.7 4.0 3.6 3.6  --   CL 97* 97* 96* 100 98  --   CO2 23 24 24 25 29   --   GLUCOSE 275* 170* 138* 163* 230*  --   BUN 24* 21 19 15 14   --   CREATININE 0.83 0.65 0.63 0.71 0.41* 0.72  CALCIUM 8.2* 8.5* 8.2* 8.1* 8.5*  --    Liver Function Tests: Recent Labs  Lab 08/15/23 1231  AST 25  ALT 13  ALKPHOS 110  BILITOT 2.0*  PROT 8.2*  ALBUMIN 3.5   Recent Labs  Lab 08/15/23 1231  LIPASE 20   No results for input(s): "AMMONIA" in the last 168 hours. Cardiac Enzymes: No results for input(s): "CKTOTAL", "CKMB", "CKMBINDEX", "TROPONINI" in the last 168 hours. BNP (last 3 results) No results for input(s): "BNP" in the last 8760 hours. CBG: Recent Labs  Lab 08/19/23 0746 08/19/23 0849 08/19/23 1044 08/19/23 1139 08/19/23 1256  GLUCAP 408* 424* 298* 182* 158*    Time spent: 35 minutes  Signed:  Gillis Santa  [facility] 08/19/2023 2:32 PM

## 2023-08-19 NOTE — Plan of Care (Signed)
  Problem: Education: Goal: Ability to describe self-care measures that may prevent or decrease complications (Diabetes Survival Skills Education) will improve Outcome: Progressing Goal: Individualized Educational Video(s) Outcome: Progressing   Problem: Coping: Goal: Ability to adjust to condition or change in health will improve Outcome: Progressing   Problem: Fluid Volume: Goal: Ability to maintain a balanced intake and output will improve Outcome: Progressing   Problem: Health Behavior/Discharge Planning: Goal: Ability to identify and utilize available resources and services will improve Outcome: Progressing Goal: Ability to manage health-related needs will improve Outcome: Progressing   Problem: Metabolic: Goal: Ability to maintain appropriate glucose levels will improve Outcome: Progressing   Problem: Nutritional: Goal: Maintenance of adequate nutrition will improve Outcome: Progressing Goal: Progress toward achieving an optimal weight will improve Outcome: Progressing   Problem: Skin Integrity: Goal: Risk for impaired skin integrity will decrease Outcome: Progressing   Problem: Tissue Perfusion: Goal: Adequacy of tissue perfusion will improve Outcome: Progressing   Problem: Education: Goal: Ability to describe self-care measures that may prevent or decrease complications (Diabetes Survival Skills Education) will improve Outcome: Progressing Goal: Individualized Educational Video(s) Outcome: Progressing   Problem: Cardiac: Goal: Ability to maintain an adequate cardiac output will improve Outcome: Progressing   Problem: Health Behavior/Discharge Planning: Goal: Ability to identify and utilize available resources and services will improve Outcome: Progressing Goal: Ability to manage health-related needs will improve Outcome: Progressing   Problem: Fluid Volume: Goal: Ability to achieve a balanced intake and output will improve Outcome: Progressing    Problem: Metabolic: Goal: Ability to maintain appropriate glucose levels will improve Outcome: Progressing   Problem: Nutritional: Goal: Maintenance of adequate nutrition will improve Outcome: Progressing Goal: Maintenance of adequate weight for body size and type will improve Outcome: Progressing   Problem: Respiratory: Goal: Will regain and/or maintain adequate ventilation Outcome: Progressing   Problem: Urinary Elimination: Goal: Ability to achieve and maintain adequate renal perfusion and functioning will improve Outcome: Progressing   Problem: Clinical Measurements: Goal: Ability to avoid or minimize complications of infection will improve Outcome: Progressing   Problem: Skin Integrity: Goal: Skin integrity will improve Outcome: Progressing   Problem: Education: Goal: Knowledge of General Education information will improve Description: Including pain rating scale, medication(s)/side effects and non-pharmacologic comfort measures Outcome: Progressing   Problem: Health Behavior/Discharge Planning: Goal: Ability to manage health-related needs will improve Outcome: Progressing   Problem: Clinical Measurements: Goal: Ability to maintain clinical measurements within normal limits will improve Outcome: Progressing Goal: Will remain free from infection Outcome: Progressing Goal: Diagnostic test results will improve Outcome: Progressing Goal: Respiratory complications will improve Outcome: Progressing Goal: Cardiovascular complication will be avoided Outcome: Progressing   Problem: Activity: Goal: Risk for activity intolerance will decrease Outcome: Progressing   Problem: Nutrition: Goal: Adequate nutrition will be maintained Outcome: Progressing   Problem: Coping: Goal: Level of anxiety will decrease Outcome: Progressing   Problem: Elimination: Goal: Will not experience complications related to bowel motility Outcome: Progressing Goal: Will not experience  complications related to urinary retention Outcome: Progressing   Problem: Pain Managment: Goal: General experience of comfort will improve and/or be controlled Outcome: Progressing   Problem: Safety: Goal: Ability to remain free from injury will improve Outcome: Progressing   Problem: Skin Integrity: Goal: Risk for impaired skin integrity will decrease Outcome: Progressing

## 2023-08-19 NOTE — Plan of Care (Signed)
  Problem: Coping: Goal: Ability to adjust to condition or change in health will improve Outcome: Progressing   Problem: Health Behavior/Discharge Planning: Goal: Ability to manage health-related needs will improve Outcome: Progressing   Problem: Nutritional: Goal: Maintenance of adequate nutrition will improve Outcome: Progressing   Problem: Tissue Perfusion: Goal: Adequacy of tissue perfusion will improve Outcome: Progressing   Problem: Metabolic: Goal: Ability to maintain appropriate glucose levels will improve Outcome: Progressing   Problem: Coping: Goal: Level of anxiety will decrease Outcome: Progressing

## 2023-08-19 NOTE — Inpatient Diabetes Management (Signed)
Inpatient Diabetes Program Recommendations  AACE/ADA: New Consensus Statement on Inpatient Glycemic Control  Target Ranges:  Prepandial:   less than 140 mg/dL      Peak postprandial:   less than 180 mg/dL (1-2 hours)      Critically ill patients:  140 - 180 mg/dL    Latest Reference Range & Units 08/18/23 07:59 08/18/23 12:29 08/18/23 16:52 08/18/23 21:09 08/19/23 07:46 08/19/23 08:49  Glucose-Capillary 70 - 99 mg/dL 161 (H) 096 (H) 045 (H) 293 (H) 408 (H) 424 (H)   Review of Glycemic Control  Diabetes history: DM2 Outpatient Diabetes medications: Lantus 40 units daily, Novolog 4 units TID with meals, Ozempic 2 mg Qweek (Sunday; recently started and only took for 2 weeks then stopped due to hypoglycemia for 3 days following injection), Jardiance 25 mg daily Current orders for Inpatient glycemic control: Semglee 10 units BID, Novolog 6 units TID with meals, Novolog 0-15 units TID with meals, Novolog 0-5 units QHS  Inpatient Diabetes Program Recommendations:    Insulin: Fasting CBG 408 mg/dl at 4:09 am today. Please consider increasing Semglee to 20 units BID.   Discharge Recommendations: Other recommendations: Needs Rx for Jardiance 25 mg daily Long acting recommendations: Insulin Glargine (LANTUS) Solostar Pen dose to be determined  Short acting recommendations:  Meal + Correction coverage Insulin aspart (NOVOLOG) FlexPen  Moderate Scale.  0-20 units TID (5 units for meal coverage plus correction) Hypoglycemia treatment recommendations: Baqsimi 3mg  Supply/Referral recommendations: Test strips Pen needles - standard   Use Adult Diabetes Insulin Treatment Post Discharge order set.   Thanks, Orlando Penner, RN, MSN, CDCES Diabetes Coordinator Inpatient Diabetes Program [phone] (Team Pager from 8am to 5pm)

## 2023-08-19 NOTE — Consult Note (Signed)
Pharmacy Antibiotic Note  Michaela Johnson is a 63 y.o. female admitted on 08/15/2023 with DKA.  Pharmacy has been consulted for vancomycin (ulcer of right toe and leukocytosis).  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  Patient info to be given to patient prior to discharge.   Follow renal function and cultures for adjustments  Height: 5\' 2"  (157.5 cm) Weight: 52.3 kg (115 lb 4.8 oz) IBW/kg (Calculated) : 50.1  Temp (24hrs), Avg:99.7 F (37.6 C), Min:98.8 F (37.1 C), Max:101 F (38.3 C)  Recent Labs  Lab 08/15/23 1231 08/15/23 1604 08/16/23 0336 08/16/23 0337 08/16/23 1334 08/16/23 1752 08/17/23 0100 08/17/23 0105 08/18/23 0458 08/19/23 0301  WBC 14.7*  --  13.1*  --   --   --  12.3*  --  7.9  --   CREATININE 1.27*   < >  --    < > 0.65 0.63  --  0.71 0.41* 0.72  VANCOPEAK  --   --   --   --   --   --   --   --   --  31   < > = values in this interval not displayed.    Estimated Creatinine Clearance: 56.2 mL/min (by C-G formula based on SCr of 0.72 mg/dL).    Allergies  Allergen Reactions   Glipizide     Other reaction(s): Other (See Comments)   Metformin Diarrhea   Codeine Itching   Levaquin [Levofloxacin In D5w] Itching    Antimicrobials this  admission: vancomycin 1/25 >>  cefepime 1/125 >> 1/26    Thank you for allowing pharmacy to be a part of this patient's care.  Juliette Alcide, PharmD, BCPS, BCIDP Work Cell: [phone] 08/19/2023 10:38 AM

## 2023-08-19 NOTE — Discharge Instructions (Signed)
While on the antibiotic linezolid avoid or minimize following foods and drinks as they may interact with linezolid.  These foods and drinks contain tyramine.  Tyramine is a natural product found in some plants and animals.  Too much tyramine in combination with linezolid can cause high levels of serotonin in the body.  Serotonin is a chemical in our body that controls mood, sleep, digestion, and other functions.  Several signs of too much serotonin in the body (also called serotonin syndrome) are fast heart rate, sweating, fevers, high blood pressure, muscle twitching, and confusion.  It is important to seek medical attention if you have these symptoms.   Avoid foods and beverages that are high in tyramine while taking linezolid, including: Alcoholic beverages (such as beer, red wine, vermouth, sherry) Aged cheeses (such as cheddar, blue cheese, swiss, feta, parmesan, camembert) Fermented or pickled foods (such as sauerkraut, pickled beets, pickled peppers, pickled cucumbers/pickles, kim chee/kimchi)  Dried/aged, smoked or processed meats and sausages (such as salami, pepperoni, liverwurst, hot dogs, bologna, bacon) Soybean products (such as soy sauce, tofu) Preserved fish (such as pickled herring) Products that contain large amounts of yeast (such as bouillon cubes, powdered soup/gravy, homemade or sourdough bread)  Broad/fava beans  Following foods are OK to eat while taking linezolid: Pasteurized cheeses (such as [place], ricotta, cottage cheese, cream cheese) Vegetables (not fermented or pickled) Non-cured or smoked meats

## 2023-08-19 NOTE — TOC Progression Note (Addendum)
Transition of Care [facility]) - Progression Note    Patient Details  Name: Michaela Johnson MRN: [id number] Date of Birth: Feb 03, 1961  [facility] CM/SW Contact  Marlowe Sax, RN Phone Number: 08/19/2023, 3:21 PM  Clinical Narrative:     Met with the patient and she stated that she will need to have a taxi voucher, confirmed address, she is waiting on pharmacy to bring her meds to go home Nurse will call taxi to arrange transport once her meds have arrived The patient has a listing that she has a legal  guardian, she does not have a lgal guardian, mistake in the system      Expected Discharge Plan and Services         Expected Discharge Date: 08/19/23                                     Social Determinants of Health (SDOH) Interventions SDOH Screenings   Food Insecurity: No Food Insecurity (08/16/2023)  Housing: Low Risk  (08/16/2023)  Recent Concern: Housing - Medium Risk (06/15/2023)  Transportation Needs: No Transportation Needs (08/16/2023)  Utilities: Not At Risk (08/16/2023)  Tobacco Use: High Risk (08/19/2023)    Readmission Risk Interventions    08/18/2023   12:17 PM 06/16/2022   11:54 AM 06/04/2022    8:58 AM  Readmission Risk Prevention Plan  Transportation Screening Complete Complete Complete  PCP or Specialist Appt within 5-7 Days Complete  Complete  Home Care Screening Complete  Complete  Medication Review (RN CM) Complete  Complete  Medication Review (RN Care Manager)  Complete   PCP or Specialist appointment within 3-5 days of discharge  Complete   HRI or Home Care Consult  Complete   SW Recovery Care/Counseling Consult  Not Complete   SW Consult Not Complete Comments  NA   Palliative Care Screening  Not Applicable   Skilled Nursing Facility  Not Applicable

## 2023-08-20 ENCOUNTER — Encounter: Payer: Self-pay | Admitting: Cardiovascular Disease

## 2023-08-20 LAB — CULTURE, BLOOD (ROUTINE X 2): Culture: NO GROWTH

## 2023-08-23 LAB — CULTURE, BLOOD (ROUTINE X 2)
Culture: NO GROWTH
Culture: NO GROWTH
Special Requests: ADEQUATE
Special Requests: ADEQUATE

## 2023-08-25 LAB — MINIMUM INHIBITORY CONC. (1 DRUG)

## 2023-08-25 LAB — MIC RESULT

## 2023-08-26 LAB — CULTURE, BLOOD (ROUTINE X 2)

## 2023-08-27 ENCOUNTER — Inpatient Hospital Stay: Payer: Medicaid Other | Admitting: Infectious Diseases

## 2023-08-30 ENCOUNTER — Inpatient Hospital Stay
Admission: EM | Admit: 2023-08-30 | Discharge: 2023-09-01 | DRG: 638 | Disposition: A | Discharge status: Home / Self Care | Payer: Medicaid Other | Attending: Internal Medicine | Admitting: Internal Medicine

## 2023-08-30 ENCOUNTER — Other Ambulatory Visit: Payer: Self-pay

## 2023-08-30 DIAGNOSIS — F17290 Nicotine dependence, other tobacco product, uncomplicated: Secondary | ICD-10-CM | POA: Diagnosis present

## 2023-08-30 DIAGNOSIS — I10 Essential (primary) hypertension: Secondary | ICD-10-CM | POA: Diagnosis present

## 2023-08-30 DIAGNOSIS — Z794 Long term (current) use of insulin: Secondary | ICD-10-CM

## 2023-08-30 DIAGNOSIS — Z833 Family history of diabetes mellitus: Secondary | ICD-10-CM

## 2023-08-30 DIAGNOSIS — M86671 Other chronic osteomyelitis, right ankle and foot: Secondary | ICD-10-CM | POA: Diagnosis present

## 2023-08-30 DIAGNOSIS — F1410 Cocaine abuse, uncomplicated: Secondary | ICD-10-CM | POA: Diagnosis present

## 2023-08-30 DIAGNOSIS — Z7901 Long term (current) use of anticoagulants: Secondary | ICD-10-CM

## 2023-08-30 DIAGNOSIS — E1142 Type 2 diabetes mellitus with diabetic polyneuropathy: Secondary | ICD-10-CM | POA: Diagnosis present

## 2023-08-30 DIAGNOSIS — Z91148 Patient's other noncompliance with medication regimen for other reason: Secondary | ICD-10-CM | POA: Diagnosis not present

## 2023-08-30 DIAGNOSIS — Z888 Allergy status to other drugs, medicaments and biological substances status: Secondary | ICD-10-CM

## 2023-08-30 DIAGNOSIS — Z880 Allergy status to penicillin: Secondary | ICD-10-CM

## 2023-08-30 DIAGNOSIS — E860 Dehydration: Secondary | ICD-10-CM | POA: Diagnosis present

## 2023-08-30 DIAGNOSIS — E1110 Type 2 diabetes mellitus with ketoacidosis without coma: Principal | ICD-10-CM | POA: Diagnosis present

## 2023-08-30 DIAGNOSIS — E1010 Type 1 diabetes mellitus with ketoacidosis without coma: Secondary | ICD-10-CM | POA: Diagnosis not present

## 2023-08-30 DIAGNOSIS — Z7984 Long term (current) use of oral hypoglycemic drugs: Secondary | ICD-10-CM | POA: Diagnosis not present

## 2023-08-30 DIAGNOSIS — Z885 Allergy status to narcotic agent status: Secondary | ICD-10-CM

## 2023-08-30 DIAGNOSIS — Z8249 Family history of ischemic heart disease and other diseases of the circulatory system: Secondary | ICD-10-CM

## 2023-08-30 DIAGNOSIS — Z8614 Personal history of Methicillin resistant Staphylococcus aureus infection: Secondary | ICD-10-CM | POA: Diagnosis not present

## 2023-08-30 DIAGNOSIS — Z79899 Other long term (current) drug therapy: Secondary | ICD-10-CM

## 2023-08-30 DIAGNOSIS — K219 Gastro-esophageal reflux disease without esophagitis: Secondary | ICD-10-CM | POA: Diagnosis present

## 2023-08-30 DIAGNOSIS — Z1152 Encounter for screening for COVID-19: Secondary | ICD-10-CM | POA: Diagnosis not present

## 2023-08-30 DIAGNOSIS — Z7985 Long-term (current) use of injectable non-insulin antidiabetic drugs: Secondary | ICD-10-CM

## 2023-08-30 DIAGNOSIS — J101 Influenza due to other identified influenza virus with other respiratory manifestations: Secondary | ICD-10-CM | POA: Diagnosis present

## 2023-08-30 DIAGNOSIS — F17210 Nicotine dependence, cigarettes, uncomplicated: Secondary | ICD-10-CM | POA: Diagnosis present

## 2023-08-30 DIAGNOSIS — Z981 Arthrodesis status: Secondary | ICD-10-CM | POA: Diagnosis not present

## 2023-08-30 DIAGNOSIS — I480 Paroxysmal atrial fibrillation: Secondary | ICD-10-CM | POA: Diagnosis present

## 2023-08-30 DIAGNOSIS — E1169 Type 2 diabetes mellitus with other specified complication: Secondary | ICD-10-CM | POA: Diagnosis present

## 2023-08-30 DIAGNOSIS — T383X6A Underdosing of insulin and oral hypoglycemic [antidiabetic] drugs, initial encounter: Secondary | ICD-10-CM | POA: Diagnosis present

## 2023-08-30 LAB — RESP PANEL BY RT-PCR (RSV, FLU A&B, COVID)  RVPGX2
Influenza A by PCR: POSITIVE — AB
Influenza B by PCR: NEGATIVE
Resp Syncytial Virus by PCR: NEGATIVE
SARS Coronavirus 2 by RT PCR: NEGATIVE

## 2023-08-30 LAB — BASIC METABOLIC PANEL
Anion gap: 24 — ABNORMAL HIGH (ref 5–15)
Anion gap: 18 — ABNORMAL HIGH (ref 5–15)
Anion gap: 11 (ref 5–15)
Anion gap: 10 (ref 5–15)
BUN: 23 mg/dL (ref 8–23)
BUN: 18 mg/dL (ref 8–23)
BUN: 19 mg/dL (ref 8–23)
BUN: 16 mg/dL (ref 8–23)
CO2: 13 mmol/L — ABNORMAL LOW (ref 22–32)
CO2: 14 mmol/L — ABNORMAL LOW (ref 22–32)
CO2: 21 mmol/L — ABNORMAL LOW (ref 22–32)
CO2: 22 mmol/L (ref 22–32)
Calcium: 9 mg/dL (ref 8.9–10.3)
Calcium: 8.1 mg/dL — ABNORMAL LOW (ref 8.9–10.3)
Calcium: 8 mg/dL — ABNORMAL LOW (ref 8.9–10.3)
Calcium: 8.2 mg/dL — ABNORMAL LOW (ref 8.9–10.3)
Chloride: 92 mmol/L — ABNORMAL LOW (ref 98–111)
Chloride: 97 mmol/L — ABNORMAL LOW (ref 98–111)
Chloride: 96 mmol/L — ABNORMAL LOW (ref 98–111)
Chloride: 98 mmol/L (ref 98–111)
Creatinine, Ser: 1.01 mg/dL — ABNORMAL HIGH (ref 0.44–1.00)
Creatinine, Ser: 0.81 mg/dL (ref 0.44–1.00)
Creatinine, Ser: 0.91 mg/dL (ref 0.44–1.00)
Creatinine, Ser: 0.71 mg/dL (ref 0.44–1.00)
GFR, Estimated: >60 mL/min (ref >60)
GFR, Estimated: >60 mL/min (ref >60)
GFR, Estimated: >60 mL/min (ref >60)
GFR, Estimated: >60 mL/min (ref >60)
Glucose, Bld: 317 mg/dL — ABNORMAL HIGH (ref 70–99)
Glucose, Bld: 281 mg/dL — ABNORMAL HIGH (ref 70–99)
Glucose, Bld: 307 mg/dL — ABNORMAL HIGH (ref 70–99)
Glucose, Bld: 157 mg/dL — ABNORMAL HIGH (ref 70–99)
Potassium: 4.9 mmol/L (ref 3.5–5.1)
Potassium: 5 mmol/L (ref 3.5–5.1)
Potassium: 4.7 mmol/L (ref 3.5–5.1)
Potassium: 4.6 mmol/L (ref 3.5–5.1)
Sodium: 129 mmol/L — ABNORMAL LOW (ref 135–145)
Sodium: 129 mmol/L — ABNORMAL LOW (ref 135–145)
Sodium: 128 mmol/L — ABNORMAL LOW (ref 135–145)
Sodium: 130 mmol/L — ABNORMAL LOW (ref 135–145)

## 2023-08-30 LAB — BLOOD GAS, VENOUS
Acid-base deficit: 11.7 mmol/L — ABNORMAL HIGH (ref 0.0–2.0)
Bicarbonate: 13.6 mmol/L — ABNORMAL LOW (ref 20.0–28.0)
O2 Saturation: 83.5 %
Patient temperature: 37
pCO2, Ven: 29 mm[Hg] — ABNORMAL LOW (ref 44–60)
pH, Ven: 7.28 (ref 7.25–7.43)
pO2, Ven: 51 mm[Hg] — ABNORMAL HIGH (ref 32–45)

## 2023-08-30 LAB — COMPREHENSIVE METABOLIC PANEL
ALT: 11 U/L (ref 0–44)
AST: 18 U/L (ref 15–41)
Albumin: 3.3 g/dL — ABNORMAL LOW (ref 3.5–5.0)
Alkaline Phosphatase: 94 U/L (ref 38–126)
Anion gap: 24 — ABNORMAL HIGH (ref 5–15)
BUN: 19 mg/dL (ref 8–23)
CO2: 15 mmol/L — ABNORMAL LOW (ref 22–32)
Calcium: 9.5 mg/dL (ref 8.9–10.3)
Chloride: 89 mmol/L — ABNORMAL LOW (ref 98–111)
Creatinine, Ser: 0.93 mg/dL (ref 0.44–1.00)
GFR, Estimated: >60 mL/min (ref >60)
Glucose, Bld: 296 mg/dL — ABNORMAL HIGH (ref 70–99)
Potassium: 5.2 mmol/L — ABNORMAL HIGH (ref 3.5–5.1)
Sodium: 128 mmol/L — ABNORMAL LOW (ref 135–145)
Total Bilirubin: 0.9 mg/dL (ref 0.0–1.2)
Total Protein: 8.8 g/dL — ABNORMAL HIGH (ref 6.5–8.1)

## 2023-08-30 LAB — CBG MONITORING, ED
Glucose-Capillary: 304 mg/dL — ABNORMAL HIGH (ref 70–99)
Glucose-Capillary: 267 mg/dL — ABNORMAL HIGH (ref 70–99)
Glucose-Capillary: 200 mg/dL — ABNORMAL HIGH (ref 70–99)
Glucose-Capillary: 230 mg/dL — ABNORMAL HIGH (ref 70–99)
Glucose-Capillary: 250 mg/dL — ABNORMAL HIGH (ref 70–99)
Glucose-Capillary: 272 mg/dL — ABNORMAL HIGH (ref 70–99)
Glucose-Capillary: 321 mg/dL — ABNORMAL HIGH (ref 70–99)
Glucose-Capillary: 252 mg/dL — ABNORMAL HIGH (ref 70–99)
Glucose-Capillary: 222 mg/dL — ABNORMAL HIGH (ref 70–99)

## 2023-08-30 LAB — URINALYSIS, COMPLETE (UACMP) WITH MICROSCOPIC
Bilirubin Urine: NEGATIVE
Glucose, UA: >=500 mg/dL — AB
Hgb urine dipstick: NEGATIVE
Ketones, ur: 80 mg/dL — AB
Leukocytes,Ua: NEGATIVE
Nitrite: POSITIVE — AB
Protein, ur: 100 mg/dL — AB
Specific Gravity, Urine: 1.024 (ref 1.005–1.030)
pH: 5 (ref 5.0–8.0)

## 2023-08-30 LAB — BETA-HYDROXYBUTYRIC ACID
Beta-Hydroxybutyric Acid: >8 mmol/L — ABNORMAL HIGH (ref 0.05–0.27)
Beta-Hydroxybutyric Acid: >8 mmol/L — ABNORMAL HIGH (ref 0.05–0.27)
Beta-Hydroxybutyric Acid: 1.68 mmol/L — ABNORMAL HIGH (ref 0.05–0.27)
Beta-Hydroxybutyric Acid: 0.56 mmol/L — ABNORMAL HIGH (ref 0.05–0.27)

## 2023-08-30 LAB — CBC
HCT: 43.9 % (ref 36.0–46.0)
Hemoglobin: 14.5 g/dL (ref 12.0–15.0)
MCH: 31 pg (ref 26.0–34.0)
MCHC: 33 g/dL (ref 30.0–36.0)
MCV: 94 fL (ref 80.0–100.0)
Platelets: 325 10*3/uL (ref 150–400)
RBC: 4.67 MIL/uL (ref 3.87–5.11)
RDW: 12.8 % (ref 11.5–15.5)
WBC: 9.2 10*3/uL (ref 4.0–10.5)
nRBC: 0 % (ref 0.0–0.2)

## 2023-08-30 LAB — HIV ANTIBODY (ROUTINE TESTING W REFLEX): HIV Screen 4th Generation wRfx: NONREACTIVE

## 2023-08-30 LAB — TROPONIN I (HIGH SENSITIVITY): Troponin I (High Sensitivity): 22 ng/L — ABNORMAL HIGH (ref <18)

## 2023-08-30 MED ORDER — RIVAROXABAN 20 MG PO TABS
20.0000 mg | ORAL_TABLET | Freq: Every day | ORAL | Status: DC
  Administered 2023-08-30 – 2023-08-31 (×2): 20 mg via ORAL
  Filled 2023-08-30 (×3): qty 1

## 2023-08-30 MED ORDER — SODIUM CHLORIDE 0.9 % IV BOLUS
1000.0000 mL | Freq: Once | INTRAVENOUS | Status: AC
Start: 2023-08-30 — End: 2023-08-30
  Administered 2023-08-30: 1000 mL via INTRAVENOUS

## 2023-08-30 MED ORDER — METOPROLOL SUCCINATE ER 25 MG PO TB24: 25.0000 mg | ORAL_TABLET | Freq: Every day | ORAL | Status: DC

## 2023-08-30 MED ORDER — CARVEDILOL 6.25 MG PO TABS
3.1250 mg | ORAL_TABLET | Freq: Two times a day (BID) | ORAL | Status: DC
  Administered 2023-08-30 – 2023-09-01 (×4): 3.125 mg via ORAL
  Filled 2023-08-30 (×4): qty 1

## 2023-08-30 MED ORDER — ALUM & MAG HYDROXIDE-SIMETH 200-200-20 MG/5ML PO SUSP
30.0000 mL | Freq: Once | ORAL | Status: AC
  Administered 2023-08-30: 30 mL via ORAL
  Filled 2023-08-30: qty 30

## 2023-08-30 MED ORDER — CARVEDILOL 6.25 MG PO TABS: 3.1250 mg | ORAL_TABLET | Freq: Two times a day (BID) | ORAL | Status: DC

## 2023-08-30 MED ORDER — SODIUM CHLORIDE 0.9 % IV BOLUS
1000.0000 mL | Freq: Once | INTRAVENOUS | Status: AC
  Administered 2023-08-30: 1000 mL via INTRAVENOUS

## 2023-08-30 MED ORDER — LINEZOLID 600 MG PO TABS
600.0000 mg | ORAL_TABLET | Freq: Two times a day (BID) | ORAL | Status: DC
  Administered 2023-08-30 – 2023-09-01 (×5): 600 mg via ORAL
  Filled 2023-08-30 (×6): qty 1

## 2023-08-30 MED ORDER — NICOTINE 21 MG/24HR TD PT24: 21.0000 mg | MEDICATED_PATCH | Freq: Every day | TRANSDERMAL | Status: DC | PRN

## 2023-08-30 MED ORDER — CHLORHEXIDINE GLUCONATE CLOTH 2 % EX PADS
6.0000 | MEDICATED_PAD | Freq: Every day | CUTANEOUS | Status: DC
  Administered 2023-08-31: 6 via TOPICAL

## 2023-08-30 MED ORDER — OXYCODONE HCL 5 MG PO TABS
5.0000 mg | ORAL_TABLET | Freq: Four times a day (QID) | ORAL | Status: DC | PRN
  Administered 2023-08-30 – 2023-09-01 (×4): 5 mg via ORAL
  Filled 2023-08-30 (×4): qty 1

## 2023-08-30 MED ORDER — LACTATED RINGERS IV BOLUS
20.0000 mL/kg | Freq: Once | INTRAVENOUS | Status: AC
  Administered 2023-08-30: 1044 mL via INTRAVENOUS

## 2023-08-30 MED ORDER — GABAPENTIN 300 MG PO CAPS
900.0000 mg | ORAL_CAPSULE | Freq: Every day | ORAL | Status: DC
  Administered 2023-08-30 – 2023-08-31 (×2): 900 mg via ORAL
  Filled 2023-08-30 (×2): qty 3

## 2023-08-30 MED ORDER — INSULIN REGULAR(HUMAN) IN NACL 100-0.9 UT/100ML-% IV SOLN
INTRAVENOUS | Status: DC
  Administered 2023-08-30: 6 [IU]/h via INTRAVENOUS
  Filled 2023-08-30: qty 100

## 2023-08-30 MED ORDER — DEXTROSE IN LACTATED RINGERS 5 % IV SOLN: INTRAVENOUS | Status: DC

## 2023-08-30 MED ORDER — ONDANSETRON HCL 4 MG/2ML IJ SOLN
4.0000 mg | Freq: Once | INTRAMUSCULAR | Status: AC
Start: 2023-08-30 — End: 2023-08-30
  Administered 2023-08-30: 4 mg via INTRAVENOUS
  Filled 2023-08-30: qty 2

## 2023-08-30 MED ORDER — PANTOPRAZOLE SODIUM 40 MG PO TBEC
40.0000 mg | DELAYED_RELEASE_TABLET | Freq: Every day | ORAL | Status: DC
  Administered 2023-08-30 – 2023-09-01 (×3): 40 mg via ORAL
  Filled 2023-08-30 (×3): qty 1

## 2023-08-30 MED ORDER — ACETAMINOPHEN 325 MG PO TABS
650.0000 mg | ORAL_TABLET | Freq: Four times a day (QID) | ORAL | Status: DC | PRN
  Administered 2023-08-30: 650 mg via ORAL
  Filled 2023-08-30: qty 2

## 2023-08-30 MED ORDER — KETOROLAC TROMETHAMINE 30 MG/ML IJ SOLN: 30.0000 mg | Freq: Three times a day (TID) | INTRAMUSCULAR | Status: DC | PRN

## 2023-08-30 MED ORDER — DEXTROSE 50 % IV SOLN: 0.0000 mL | INTRAVENOUS | Status: DC | PRN

## 2023-08-30 MED ORDER — LACTATED RINGERS IV SOLN: INTRAVENOUS | Status: DC

## 2023-08-30 NOTE — H&P (Signed)
 History and Physical    Michaela Johnson FMW:[id number] DOB: 1960/08/02 DOA: 08/30/2023  PCP: Era Raisin, NP (Confirm with patient/family/NH records and if not entered, this has to be entered at [facility] point of entry) Patient coming from: Home  I have personally briefly reviewed patient's old medical records in [facility]  Chief Complaint: Feeling sick, run out of insulin   HPI: Michaela Johnson is a 63 y.o. female with medical history significant of IDDM, recently diagnosed right foot osteomyelitis and MRSA bacteremia on Zyvox , PAF on Xarelto , peripheral neuropathy, GERD, cocaine abuse, presented with nauseous vomiting malaise dehydration.  Patient claimed that he ran out of insulin  since yesterday and yesterday evening she started develop nauseous vomiting and malaise and whole body aching.  Denied any abdominal pain no diarrhea no fever or chills no cough no chest pains.  She does report that she has been compliant with Zyvox .  ED Course: Tachycardia, stable blood pressure not hypoxic afebrile.  Blood work showed glucose 296, creatinine 0.9, bicarb 15, anion gap 20, K5.2, VBG 7.28/29/51  Patient was given 1 L of IV bolus and started on Endo tool insulin  drip  Review of Systems: As per HPI otherwise 14 point review of systems negative.    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 07/30/2020   Constipation    Depression    Diabetes mellitus without complication (HCC)    Dysrhythmia     PAF   GERD (gastroesophageal reflux disease)    Hypertension    Neuropathy    Nicotine  dependence 07/31/2020   PAF (paroxysmal atrial fibrillation) (HCC)    Panic attacks    Pneumonia    Tobacco use    Vitamin D  deficiency     Past Surgical History:  Procedure Laterality Date   ANKLE FUSION Right 07/30/2020    Procedure: ANKLE FUSION;  Surgeon: Celena Sharper, MD;  Location: [facility] OR;  Service: Orthopedics;  Laterality: Right;   APPLICATION OF WOUND VAAPPLICATION OF WOUND VAC (Right Ankle)C (Right Ankle)  07/31/2020   APPLICATION OF WOUND VAC Right 07/30/2020   Procedure: APPLICATION OF WOUND VAC;  Surgeon: Celena Sharper, MD;  Location: [facility] OR;  Service: Orthopedics;  Laterality: Right;   HARDWARE REMOVAL Right 07/30/2020   Procedure: HARDWARE REMOVAL RIGHT ANKLE;  Surgeon: Celena Sharper, MD;  Location: [facility] OR;  Service: Orthopedics;  Laterality: Right;   HARDWARE REMOVAL RIGHT ANKLE (Right Ankle  07/31/2020   INCISION AND DRAINAGE ABSCESS N/A 06/15/2023   Procedure: INCISION AND DRAINAGE NASAL ABSCESS;  Surgeon: Herminio Miu, MD;  Location: [facility] ORS;  Service: ENT;  Laterality: N/A;   INTRAMEDULLARY (IM) NAIL INTERTROCHANTERIC Right 01/04/2022   Procedure: INTRAMEDULLARY (IM) NAIL INTERTROCHANTRIC;  Surgeon: Edie Norleen PARAS, MD;  Location: [facility] ORS;  Service: Orthopedics;  Laterality: Right;   ORIF ANKLE FRACTURE Right 07/04/2020   Procedure: OPEN REDUCTION INTERNAL FIXATION (ORIF) ANKLE FRACTURE;  Surgeon: Leora Lynwood SAUNDERS, MD;  Location: [facility] ORS;  Service: Orthopedics;  Laterality: Right;   TEE WITHOUT CARDIOVERSION N/A 08/19/2023   Procedure: TRANSESOPHAGEAL ECHOCARDIOGRAM (TEE);  Surgeon: Gollan, Timothy J, MD;  Location: [facility] ORS;  Service: Cardiovascular;  Laterality: N/A;     reports that she has been smoking cigarettes, e-cigarettes, and cigars. She has a 41 pack-year smoking history. She has never used smokeless  tobacco. She reports that she does not currently use alcohol  after a past usage of about 6.0 standard drinks of alcohol  per week. She reports that she does not currently use drugs after having used the following drugs: Marijuana. Frequency: 7.00 times per week.  Allergies  Allergen Reactions   Glipizide     Other reaction(s): Other (See Comments)   Metformin Diarrhea   Codeine Itching    Levaquin  [Levofloxacin  In D5w] Itching   Penicillins     Family History  Problem Relation Age of Onset   Diabetes Mother    CAD Father    Heart Problems Father    Heart failure Father    Diabetes Paternal Aunt      Prior to Admission medications   Medication Sig Start Date End Date Taking? Authorizing Provider  Cholecalciferol  (VITAMIN D ) 125 MCG (5000 UT) CAPS Take 1 capsule by mouth daily. 08/03/20   Deward Eck, PA-C  gabapentin  (NEURONTIN ) 300 MG capsule Take 900 mg by mouth at bedtime. 02/28/20   [provider]  JARDIANCE  25 MG TABS tablet Take 25 mg by mouth daily. 05/27/23   [provider]  LANTUS  SOLOSTAR 100 UNIT/ML Solostar Pen Inject 20 Units into the skin daily. This is your home dose of long-acting insulin . 08/19/23   Von Bellis, MD  linezolid  (ZYVOX ) 600 MG tablet Take 1 tablet (600 mg total) by mouth 2 (two) times daily for 21 days. 08/19/23 09/09/23  Von Bellis, MD  metoprolol  succinate (TOPROL -XL) 25 MG 24 hr tablet Take 1 tablet (25 mg total) by mouth daily. Take with or immediately following a meal. 01/30/23   Gollan, Timothy J, MD  Multiple Vitamin (MULTIVITAMIN WITH MINERALS) TABS tablet Take 1 tablet by mouth daily. 01/07/22   Amin, Sumayya, MD  NATROBA 0.9 % SUSP Apply topically. 08/05/23   [provider]  nicotine  (NICODERM CQ  - DOSED IN MG/24 HOURS) 21 mg/24hr patch Place 1 patch (21 mg total) onto the skin daily as needed (nicotine  craving). 06/17/23   Laurita Pillion, MD  NOVOLOG  FLEXPEN 100 UNIT/ML FlexPen Inject 15 Units into the skin 3 (three) times daily with meals. 08/19/23   Von Bellis, MD  omeprazole (PRILOSEC) 20 MG capsule Take 20 mg by mouth daily.    [provider]  OZEMPIC, 2 MG/DOSE, 8 MG/3ML SOPN Inject 2 mg into the skin once a week. Sunday 06/02/23   [provider]  rivaroxaban  (XARELTO ) 20 MG TABS tablet Take 1 tablet (20 mg total) by mouth daily with supper. 01/30/23   Gollan, Timothy J, MD   VICTOZA 18 MG/3ML SOPN Inject 4 mg into the skin daily. 11/20/22   [provider]    Physical Exam: Vitals:   08/30/23 0714 08/30/23 0716 08/30/23 1030  BP:  136/64 (!) 146/71  Pulse:  (!) 119 (!) 123  Resp:  18 (!) 21  Temp:  98.3 F (36.8 C)   TempSrc:  Oral   SpO2: 93% 94% 98%    Constitutional: NAD, calm, comfortable Vitals:   08/30/23 0714 08/30/23 0716 08/30/23 1030  BP:  136/64 (!) 146/71  Pulse:  (!) 119 (!) 123  Resp:  18 (!) 21  Temp:  98.3 F (36.8 C)   TempSrc:  Oral   SpO2: 93% 94% 98%   Eyes: PERRL, lids and conjunctivae normal ENMT: Mucous membranes are dry. Posterior pharynx clear of any exudate or lesions.Normal dentition.  Neck: normal, supple, no masses, no thyromegaly Respiratory: clear to auscultation bilaterally, no wheezing, no  crackles. Normal respiratory effort. No accessory muscle use.  Cardiovascular: Regular rate and rhythm, no murmurs / rubs / gallops. No extremity edema. 2+ pedal pulses. No carotid bruits.  Abdomen: no tenderness, no masses palpated. No hepatosplenomegaly. Bowel sounds positive.  Musculoskeletal: no clubbing / cyanosis. No joint deformity upper and lower extremities. Good ROM, no contractures. Normal muscle tone.  Chronic osteomyelitis changes on right foot involving big toe, foot and fifth toe Skin: no rashes, lesions, ulcers. No induration Neurologic: CN 2-12 grossly intact. Sensation intact, DTR normal. Strength 5/5 in all 4.  Psychiatric: Normal judgment and insight. Alert and oriented x 3. Normal mood.     Labs on Admission: I have personally reviewed following labs and imaging studies  CBC: Recent Labs  Lab 08/30/23 0719  WBC 9.2  HGB 14.5  HCT 43.9  MCV 94.0  PLT 325   Basic Metabolic Panel: Recent Labs  Lab 08/30/23 0719  NA 128*  K 5.2*  CL 89*  CO2 15*  GLUCOSE 296*  BUN 19  CREATININE 0.93  CALCIUM  9.5   GFR: CrCl cannot be calculated (Unknown ideal weight.). Liver Function  Tests: Recent Labs  Lab 08/30/23 0719  AST 18  ALT 11  ALKPHOS 94  BILITOT 0.9  PROT 8.8*  ALBUMIN 3.3*   No results for input(s): LIPASE, AMYLASE in the last 168 hours. No results for input(s): AMMONIA in the last 168 hours. Coagulation Profile: No results for input(s): INR, PROTIME in the last 168 hours. Cardiac Enzymes: No results for input(s): CKTOTAL, CKMB, CKMBINDEX, TROPONINI in the last 168 hours. BNP (last 3 results) No results for input(s): PROBNP in the last 8760 hours. HbA1C: No results for input(s): HGBA1C in the last 72 hours. CBG: Recent Labs  Lab 08/30/23 1015  GLUCAP 304*   Lipid Profile: No results for input(s): CHOL, HDL, LDLCALC, TRIG, CHOLHDL, LDLDIRECT in the last 72 hours. Thyroid  Function Tests: No results for input(s): TSH, T4TOTAL, FREET4, T3FREE, THYROIDAB in the last 72 hours. Anemia Panel: No results for input(s): VITAMINB12, FOLATE, FERRITIN, TIBC, IRON, RETICCTPCT in the last 72 hours. Urine analysis:    Component Value Date/Time   COLORURINE YELLOW (A) 08/30/2023 1007   APPEARANCEUR CLEAR (A) 08/30/2023 1007   LABSPEC 1.024 08/30/2023 1007   PHURINE 5.0 08/30/2023 1007   GLUCOSEU >=500 (A) 08/30/2023 1007   HGBUR NEGATIVE 08/30/2023 1007   BILIRUBINUR NEGATIVE 08/30/2023 1007   KETONESUR 80 (A) 08/30/2023 1007   PROTEINUR 100 (A) 08/30/2023 1007   NITRITE POSITIVE (A) 08/30/2023 1007   LEUKOCYTESUR NEGATIVE 08/30/2023 1007    Radiological Exams on Admission: No results found.  EKG: None  Assessment/Plan Active Problems:   DKA (diabetic ketoacidosis) (HCC)  (please populate well all problems here in Problem List. (For example, if patient is on BP meds at home and you resume or decide to hold them, it is a problem that needs to be her. Same for CAD, COPD, HLD and so on)  DKA -Secondary to noncompliance with insulin  regimen.  Patient reported that she does have insulin   prescription now filled in [facility] -Continue insulin  drip -BMP every 4 hours -Clinically positive appears to be dehydrated, will give 1 L more bolus  Right foot osteomyelitis involving first, fourth and fifth toes Recent MRSA bacteremia -Wound appear to be chronic and no signs of worsening infection.  Patient reports that she has been compliant with Zyvox  twice daily, and last dose will be February 19.  Cocaine abuse -Discontinue metoprolol  -Start Coreg  -Patient  interested in drug rehab program, will consult  PAF -In sinus tachycardia, continue metoprolol  to Coreg  given the history of cocaine abuse -Continue Toradol   Nicotine  dependence -Cessation education performed at bedside.  Continue nicotine  patch  DVT prophylaxis: Xarelto  Code Status: Full code Family Communication: None at bedside Disposition Plan: Expect less than 2 midnight hospital stay Consults called: None Admission status: Stepdown observation for insulin  drip   Cort ONEIDA Mana MD [facility] Pager [phone]  08/30/2023, 10:57 AM

## 2023-08-30 NOTE — ED Notes (Signed)
 Dr Jeane Miguel notifiedn and aware of pts HR in the 130s.

## 2023-08-30 NOTE — ED Triage Notes (Signed)
 Pt arrives via [facility] for emesis x1 day and has not been eating. Hx of afib and HTN. Sinus tach between 100s and 120s for EMS.   EMS vitals: 152/90 96 % SPO2 on RA 288 CBG

## 2023-08-30 NOTE — ED Provider Notes (Signed)
 [facility] Provider Note    Event Date/Time   First MD Initiated Contact with Patient 08/30/23 0710     (approximate)  History   Chief Complaint: Emesis  HPI  Michaela Johnson is a 63 y.o. female with a past medical history of alcohol  use, anxiety, gastric reflux, hypertension, proximal atrial fibrillation, diabetes, presents to the emergency department for nausea and vomiting.  According to the patient since yesterday evening she has not been able to keep down anything orally has been nauseated with frequent episodes of vomiting.  Denies any diarrhea, denies any abdominal pain, denies any fever.  I reviewed the patient's chart she was just discharged 08/19/2023 after an admission for diabetic ketoacidosis.  Patient also states some congestion and is concerned that she may have contracted COVID.  Physical Exam   Triage Vital Signs: ED Triage Vitals  Encounter Vitals Group     BP 08/30/23 0716 136/64     Systolic BP Percentile --      Diastolic BP Percentile --      Pulse Rate 08/30/23 0716 (!) 119     Resp 08/30/23 0716 18     Temp 08/30/23 0716 98.3 F (36.8 C)     Temp Source 08/30/23 0716 Oral     SpO2 08/30/23 0714 93 %     Weight --      Height --      Head Circumference --      Peak Flow --      Pain Score 08/30/23 0714 10     Pain Loc --      Pain Education --      Exclude from Growth Chart --     Most recent vital signs: Vitals:   08/30/23 0714 08/30/23 0716  BP:  136/64  Pulse:  (!) 119  Resp:  18  Temp:  98.3 F (36.8 C)  SpO2: 93% 94%    General: Awake, no distress.  Dry mucous membranes. CV:  Good peripheral perfusion.  Regular rate and rhythm  Resp:  Normal effort.  Equal breath sounds bilaterally.  Abd:  No distention.  Soft, nontender.    ED Results / Procedures / Treatments   MEDICATIONS ORDERED IN ED: Medications  sodium chloride  0.9 % bolus 1,000 mL (has no administration in time range)  ondansetron  (ZOFRAN )  injection 4 mg (has no administration in time range)     IMPRESSION / MDM / ASSESSMENT AND PLAN / ED COURSE  I reviewed the triage vital signs and the nursing notes.  Patient's presentation is most consistent with acute presentation with potential threat to life or bodily function.  Patient presents to the emergency department for nausea vomiting since yesterday unable to keep down anything orally.  Patient had a recent admission for diabetic ketoacidosis with similar presentation.  We will check labs including a CBC chemistry VBG we will check a urinalysis.  Given the patient's concern for congestion nausea vomiting we will obtain a COVID/flu swab as a precaution.  We will IV hydrate while awaiting results.  Patient agreeable to plan of care.  Patient's labs have resulted showing hyperglycemia around 300 with anion gap of 24 consistent with diabetic ketoacidosis.  Patient CBC overall reassuring.  VBG shows a pH of 7.28 again consistent with diabetic ketoacidosis.  Patient has received IV fluids.  Will start the patient on insulin  infusion and admit to the hospital service.  I spoke to the patient regarding her diagnosis and plan.  Patient states  she gave a friend money to pick up her insulin  2 days ago but they never returned with her insulin .  CRITICAL CARE Performed by: Franky Moores   Total critical care time: 30 minutes  Critical care time was exclusive of separately billable procedures and treating other patients.  Critical care was necessary to treat or prevent imminent or life-threatening deterioration.  Critical care was time spent personally by me on the following activities: development of treatment plan with patient and/or surrogate as well as nursing, discussions with consultants, evaluation of patient's response to treatment, examination of patient, obtaining history from patient or surrogate, ordering and performing treatments and interventions, ordering and review of  laboratory studies, ordering and review of radiographic studies, pulse oximetry and re-evaluation of patient's condition.   FINAL CLINICAL IMPRESSION(S) / ED DIAGNOSES   Nausea vomiting Diabetic ketoacidosis  Note:  This document was prepared using Dragon voice recognition software and may include unintentional dictation errors.   Moores Franky, MD 08/30/23 [phone]

## 2023-08-31 ENCOUNTER — Other Ambulatory Visit: Payer: Self-pay

## 2023-08-31 ENCOUNTER — Encounter: Payer: Self-pay | Admitting: Internal Medicine

## 2023-08-31 DIAGNOSIS — E1110 Type 2 diabetes mellitus with ketoacidosis without coma: Secondary | ICD-10-CM | POA: Diagnosis not present

## 2023-08-31 DIAGNOSIS — J101 Influenza due to other identified influenza virus with other respiratory manifestations: Secondary | ICD-10-CM | POA: Diagnosis present

## 2023-08-31 LAB — BASIC METABOLIC PANEL
Anion gap: 5 (ref 5–15)
Anion gap: 8 (ref 5–15)
BUN: 14 mg/dL (ref 8–23)
BUN: 18 mg/dL (ref 8–23)
CO2: 26 mmol/L (ref 22–32)
CO2: 25 mmol/L (ref 22–32)
Calcium: 8.1 mg/dL — ABNORMAL LOW (ref 8.9–10.3)
Calcium: 8.2 mg/dL — ABNORMAL LOW (ref 8.9–10.3)
Chloride: 102 mmol/L (ref 98–111)
Chloride: 95 mmol/L — ABNORMAL LOW (ref 98–111)
Creatinine, Ser: 0.6 mg/dL (ref 0.44–1.00)
Creatinine, Ser: 0.69 mg/dL (ref 0.44–1.00)
GFR, Estimated: >60 mL/min (ref >60)
GFR, Estimated: >60 mL/min (ref >60)
Glucose, Bld: 153 mg/dL — ABNORMAL HIGH (ref 70–99)
Glucose, Bld: 458 mg/dL — ABNORMAL HIGH (ref 70–99)
Potassium: 3.8 mmol/L (ref 3.5–5.1)
Potassium: 5 mmol/L (ref 3.5–5.1)
Sodium: 133 mmol/L — ABNORMAL LOW (ref 135–145)
Sodium: 128 mmol/L — ABNORMAL LOW (ref 135–145)

## 2023-08-31 LAB — GLUCOSE, CAPILLARY
Glucose-Capillary: 135 mg/dL — ABNORMAL HIGH (ref 70–99)
Glucose-Capillary: 140 mg/dL — ABNORMAL HIGH (ref 70–99)
Glucose-Capillary: 151 mg/dL — ABNORMAL HIGH (ref 70–99)
Glucose-Capillary: 154 mg/dL — ABNORMAL HIGH (ref 70–99)
Glucose-Capillary: 155 mg/dL — ABNORMAL HIGH (ref 70–99)
Glucose-Capillary: 159 mg/dL — ABNORMAL HIGH (ref 70–99)
Glucose-Capillary: 161 mg/dL — ABNORMAL HIGH (ref 70–99)
Glucose-Capillary: 166 mg/dL — ABNORMAL HIGH (ref 70–99)
Glucose-Capillary: 177 mg/dL — ABNORMAL HIGH (ref 70–99)
Glucose-Capillary: 231 mg/dL — ABNORMAL HIGH (ref 70–99)
Glucose-Capillary: 474 mg/dL — ABNORMAL HIGH (ref 70–99)
Glucose-Capillary: 505 mg/dL (ref 70–99)

## 2023-08-31 LAB — BETA-HYDROXYBUTYRIC ACID: Beta-Hydroxybutyric Acid: 0.4 mmol/L — ABNORMAL HIGH (ref 0.05–0.27)

## 2023-08-31 LAB — MRSA NEXT GEN BY PCR, NASAL: MRSA by PCR Next Gen: DETECTED — AB

## 2023-08-31 MED ORDER — GUAIFENESIN 100 MG/5ML PO LIQD
10.0000 mL | ORAL | 0 refills | Status: DC | PRN
  Filled 2023-08-31: qty 118, 2d supply, fill #0

## 2023-08-31 MED ORDER — CARVEDILOL 3.125 MG PO TABS
3.1250 mg | ORAL_TABLET | Freq: Two times a day (BID) | ORAL | 1 refills | Status: DC
  Filled 2023-08-31: qty 60, 30d supply, fill #0

## 2023-08-31 MED ORDER — INSULIN ASPART 100 UNIT/ML IJ SOLN
0.0000 [IU] | INTRAMUSCULAR | Status: DC
  Administered 2023-08-31: 3 [IU] via SUBCUTANEOUS
  Administered 2023-08-31: 1 [IU] via SUBCUTANEOUS
  Administered 2023-09-01: 2 [IU] via SUBCUTANEOUS
  Administered 2023-09-01: 7 [IU] via SUBCUTANEOUS
  Administered 2023-09-01: 5 [IU] via SUBCUTANEOUS
  Filled 2023-08-31 (×5): qty 1

## 2023-08-31 MED ORDER — NOVOLOG FLEXPEN 100 UNIT/ML ~~LOC~~ SOPN
15.0000 [IU] | PEN_INJECTOR | Freq: Three times a day (TID) | SUBCUTANEOUS | 1 refills | Status: DC
  Filled 2023-08-31 (×2): qty 15, 33d supply, fill #0

## 2023-08-31 MED ORDER — OSELTAMIVIR PHOSPHATE 75 MG PO CAPS
75.0000 mg | ORAL_CAPSULE | Freq: Two times a day (BID) | ORAL | 0 refills | Status: AC
  Filled 2023-08-31: qty 9, 5d supply, fill #0

## 2023-08-31 MED ORDER — INSULIN GLARGINE-YFGN 100 UNIT/ML ~~LOC~~ SOLN
20.0000 [IU] | Freq: Two times a day (BID) | SUBCUTANEOUS | Status: DC
  Administered 2023-08-31 – 2023-09-01 (×2): 20 [IU] via SUBCUTANEOUS
  Filled 2023-08-31 (×4): qty 0.2

## 2023-08-31 MED ORDER — GUAIFENESIN 100 MG/5ML PO LIQD
10.0000 mL | ORAL | Status: DC | PRN
  Administered 2023-08-31: 10 mL via ORAL
  Filled 2023-08-31: qty 10

## 2023-08-31 MED ORDER — INSULIN GLARGINE-YFGN 100 UNIT/ML ~~LOC~~ SOLN
20.0000 [IU] | SUBCUTANEOUS | Status: DC
  Administered 2023-08-31: 20 [IU] via SUBCUTANEOUS
  Filled 2023-08-31: qty 0.2

## 2023-08-31 MED ORDER — LANTUS SOLOSTAR 100 UNIT/ML ~~LOC~~ SOPN
20.0000 [IU] | PEN_INJECTOR | Freq: Every day | SUBCUTANEOUS | 1 refills | Status: DC
  Filled 2023-08-31: qty 15, 75d supply, fill #0

## 2023-08-31 MED ORDER — INSULIN ASPART 100 UNIT/ML IJ SOLN
4.0000 [IU] | Freq: Three times a day (TID) | INTRAMUSCULAR | Status: DC
  Administered 2023-09-01 (×2): 4 [IU] via SUBCUTANEOUS
  Filled 2023-08-31 (×2): qty 1

## 2023-08-31 MED ORDER — INSULIN ASPART 100 UNIT/ML IJ SOLN
20.0000 [IU] | Freq: Once | INTRAMUSCULAR | Status: AC
Start: 2023-08-31 — End: 2023-08-31
  Administered 2023-08-31: 20 [IU] via SUBCUTANEOUS

## 2023-08-31 MED ORDER — MUPIROCIN 2 % EX OINT
1.0000 | TOPICAL_OINTMENT | Freq: Two times a day (BID) | CUTANEOUS | Status: DC
  Administered 2023-08-31 – 2023-09-01 (×3): 1 via NASAL
  Filled 2023-08-31: qty 22

## 2023-08-31 MED ORDER — OSELTAMIVIR PHOSPHATE 75 MG PO CAPS
75.0000 mg | ORAL_CAPSULE | Freq: Two times a day (BID) | ORAL | Status: DC
  Administered 2023-08-31 – 2023-09-01 (×3): 75 mg via ORAL
  Filled 2023-08-31 (×3): qty 1

## 2023-08-31 NOTE — Discharge Instructions (Addendum)
Intensive Outpatient Programs   [facility] [address] [place],  [place], [place] [phone]  [facility] (Inpatient and [phone] (Suboxone and Methadone) [address] [phone]  ADS: Alcohol & Drug [facility] Programs - Intensive Outpatient [address] [place], [place] [address], [place]  [phone]  [facility] Margo Aye (Outpatient, Inpatient, [facility] (Groups and Residental) (insurance only) [phone] [place], [place] [phone]   [facility] (for caregivers and family) [address] Laurell Josephs [address], [place], [place] [phone]  Residential Treatment Programs  [facility](2 years) Residential: 20 days)[facility] ([facility] [address] [place], [place], [place] [phone] or [phone]  [facility] [address] [place], [place], [place] [phone]  [facility] (RTS) [address] [place], [place], [place] [phone] Admissions: 8am-3pm M-F  [facility] Program: Residential Program [phone] Days)             [facility]: [facility]  [place], [place], [place]  [phone] or [phone] in Hours over the weekend or by referral)  [facility] [address] [place], [place] [address] [phone] (Do virtual or phone assessment, offer transportation within 25 miles, have in patient and Outpatient options)   Mobil Crisis: [facility]:[phone] (for crisis  response 24 hours a day)      [facility] [place] [facility] [place] offers free transportation to and from covered services, including doctor's appointments and pharmacies. You can schedule a ride by calling [employer] at [phone].  How to schedule a ride  Call at least 48 hours before your appointment, but no more than 30 days in advance Have your member ID number ready Provide the address, ZIP code, and phone number for your pickup and dropoff locations Provide the date, time, and length of your appointment      Food Resources  Agency Name: [facility] Address: [address] [place], [place] [address] Phone: [phone] Website: [email or web address] Service(s) Offered: Housing services, self-sufficiency, congregate meal program, weatherization program, Event organiser program, emergency food assistance,  housing counseling, home ownership program, wheels - to work program.  [employer] free for 60 and older at various locations from [employer], Monday-Friday:  [employer], [address] [place], [phone] -[facility], [address], [place] [phone]  -[facility], [address] [place] [phone]  -[address], [address], [place], [phone]  Agency Name: [facility] Address: [phone] [address], [place], [place] [address] Phone: [phone] Website: [email or web address] Service(s) Offered: Home delivered hot, frozen, and emergency  meals. Grocery assistance program which matches  volunteers one-on-one with seniors unable to grocery shop  for themselves. Must be 60 years and older; less than 20  hours of in-home aide service, limited or no driving ability;  live alone or with someone with a disability; live in  [place].  Agency Name: [facility]) Address: [address], [place], [place] [address] Phone:  [phone] Service(s) Offered: Food is served to shut-ins, homeless, elderly, and low income people in the community every Saturday (11:30 am-12:30 pm) and Sunday (12:30 pm-1:30pm). Volunteers also offer help and encouragement in seeking employment,  and spiritual guidance.  Agency Name: Department of Social Services Address:  [address] [place], [place] [address] Phone: [phone] Service(s) Offered: Child support services; child welfare services; food stamps; Medicaid; work first family assistance; and aid with fuel,  rent, food and medicine.  Agency Name: Dietitian Address: [address] [place], [place] Phone: [phone] Website: [email or web address] Services Offered: Monday 10:00am-12:00, 8:00pm-9:00pm, and Friday 10:00am-12:00.  Agency Name: [employer] [facility] Address: [address] [place], [place] [address] Phone: [phone] Website: [email or web address] Service(s) Offered: Serves weekday meals, open from 11:30 am- 1:00 pm., and 6:30-7:30pm, Monday-Wednesday-Friday distributes food 3:30-6pm, Monday-Wednesday-Friday.  Agency Name: [facility] Address: [address] [place], [place] Phone: [phone] Website: [email or web address] Services Offered: Distributes food the 4th Saturday of the month, starting at 8:00 am  Agency Name: [facility] Address: [phone] [address] [place], [place] [address] Phone: [phone] Website: [email or web address] Service(s) Offered: Bread of life, weekly food pantry. Open Wednesdays from 10:00am-noon.  Agency Name: [facility] [employer] Address: [address] [place], [place], [place] Phone: [phone] Services Offered: Distributes food 9am-1pm, Monday-Thursday. Call for details.  Agency Name: [facility] Address: [address], [place], [place] [address] Phone: [phone] Website:  [email or web address] Service(s) Offered: Games developer. Call for assistance.  Agency Name: [facility] Address: [address] [place], [place] [address] Phone: [phone] Service Offered: Emergency Food Pantry. Call for appointment.  Agency Name: [facility] Address: [address], [place], [place] [address] Phone: [phone] Website: [email or web address] Services Offered: Games developer. Call for details  Agency Name: [facility] Address: [address] [place], [place] Phone: [phone] Website: [email or web address] Service(s) Offered: Emergency Food Pantry. Call for details.  Agency Name: Holiday representative Address: [address] [place], [place] [address] Phone: [phone] or [phone] Website: [email or web address] Service(s) Offered: Distribute food 9am-11:30 am, Tuesday-Friday, and 1-3:30pm, Monday-Friday. Food pantry Monday-Friday 1pm-3pm, fresh items, Mon.-Wed.-Fri.  Agency Name: [facility] Address: [address] [place], [place] [address] Phone: [phone] Website: [email or web address] Services Offered: Distribute food Tues and Sats from 9:00am-noon. Closed 1st Saturday of each month. Call for details  Agency Name: [employer] [employer] Address: Reynaldo Minium [facility] [address] [place] [address] Phone: [phone]  Services Offered: Delivers meals every Thursday  Transportation Resources  Agency Name: [facility] Agency Address: [address] [place], [place] [address] Phone: [phone] Email: [email or web address] Website: [email or web address] Service(s) Offered: Housing services, self-sufficiency, congregate meal program, weatherization program, Field seismologist program, emergency food assistance,  housing counseling, home ownership program, wheels-towork program.  Agency Name: [facility] [employer] [phone]) Address: [address], [place], [place] [address] Phone: [phone] Website: www.acta-.com Service(s) Offered: Transportation for [employer], subscription and demand response; Dial-a-Ride for citizens 81 years of age or older.  Agency Name: Department of Social Services Address: [address] [place], [place] [address] Phone: [phone] Service(s) Offered: Child support services; child welfare services; food stamps; Medicaid; work first family assistance; and aid with fuel,  rent, food and medicine, transportation assistance.  Agency Name: Disabled [employer] (DAV) Transportation  Network Phone: [phone] Service(s) Offered: Transports veterans to the [facility] medical center. Call  forty-eight hours in advance and leave the name, telephone  number, date, and time of appointment. Veteran will be  contacted by the driver the day before the appointment to  arrange a pick up point   Transportation Resources  Agency Name: [facility] Agency Address: [address] [place],  [place] [address] Phone: [phone] Email: [email or web address] Website: [email or web address] Service(s) Offered: Housing services, self-sufficiency, congregate meal program, weatherization program, Field seismologist program, emergency food assistance,  housing counseling, home ownership program, wheels-towork program.  Agency Name: [facility] [employer] [phone]) Address: [address], [place], [place] [address] Phone: [phone] Website: [email or web address] Service(s) Offered: Transportation for [employer], subscription and demand response; Dial-a-Ride for citizens 69 years of age or older.  Agency Name: Department of Social Services Address: [address] [place], [place] [address] Phone: [phone] Service(s) Offered: Child support services; child welfare  services; food stamps; Medicaid; work first family assistance; and aid with fuel,  rent, food and medicine, transportation assistance.  Agency Name: Disabled [employer] (DAV) Transportation  Network Phone: [phone] Service(s) Offered: Transports veterans to the [facility] medical center. Call  forty-eight hours in advance and leave the name, telephone  number, date, and time of appointment. Veteran will be  contacted by the driver the day before the appointment to  arrange a pick up point    [employer] ACTA currently provides door to door services. ACTA connects with PART daily for services to [facility]. ACTA also performs contract services to [employer] operates 27 vehicles, all but 3 mini-vans are equipped with lifts for special needs as well as the general public. ACTA drivers are each CDL certified and trained in First Aid and CPR. ACTA was established in 2002 by [employer]. An independent Industrial/product designer. ACTA operates via Cytogeneticist with required Research scientist (physical sciences) from [place]. ACTA provides over 80,000 passenger trips each year, including [facility] and [employer] sites.  Call at least by 11 AM one business day prior to needing transportation  [employer].                      [place], [place] [address]     Office Hours: Monday-Friday  8 AM - 5 PM  Agency Name: [facility] Agency Address: [address] [place], [place] [address] Phone: [phone] Website: www.[email or web address] Service(s) Offered: Housing services, self-sufficiency, congregate meal program, and individual development account program.  Agency Name: [employer] [facility] [place] Address: [address], [place], [place] [address] Phone: [phone] Email: Ardell Isaacsorg Website:  www.[email or web address].org Service(s) Offered: Housing the homeless, feeding the hungry, Company secretary, job and education related services.  Agency Name: [facility] Address: [address], [place], [place] [address] Phone: [phone] Email: [email or web address] Service(s) Offered: Counseling, problem pregnancy, advocacy for Hispanics, limited emergency financial assistance.  Agency Name: Department of Social Services Address: [address] [place], [place] [address] Phone: [phone] Website: [email or web address] Service(s) Offered: Child support services; child welfare services; SNAP; Medicaid; work first family assistance; and aid with fuel,  rent, food and medicine.  Agency Name: Holiday representative Address: [address] [place], [place] [address] Phone: [phone] or [phone] Email: [email or web address] Service(s) Offered: Family services and transient assistance; emergency food, fuel, clothing, limited furniture, utilities; budget counseling, general counseling; give a kid a coat; thrift store; Christmas food and toys. Utility assistance, food pantry, rental  assistance, life sustaining medicine

## 2023-08-31 NOTE — Inpatient Diabetes Management (Addendum)
 Inpatient Diabetes Program Recommendations  AACE/ADA: New Consensus Statement on Inpatient Glycemic Control (2015)  Target Ranges:  Prepandial:   less than 140 mg/dL      Peak postprandial:   less than 180 mg/dL (1-2 hours)      Critically ill patients:  140 - 180 mg/dL   Lab Results  Component Value Date   GLUCAP 135 (H) 08/31/2023   HGBA1C 9.8 (H) 08/16/2023    Latest Reference Range & Units 08/30/23 07:19  Sodium 135 - 145 mmol/L 128 (L)  Potassium 3.5 - 5.1 mmol/L 5.2 (H)  Chloride 98 - 111 mmol/L 89 (L)  CO2 22 - 32 mmol/L 15 (L)  Glucose 70 - 99 mg/dL 086 (H)  BUN 8 - 23 mg/dL 19  Creatinine 5.78 - 4.69 mg/dL 6.29  Calcium  8.9 - 10.3 mg/dL 9.5  Anion gap 5 - 15  24 (H)  (L): Data is abnormally low (H): Data is abnormally high  Diabetes history: DM2 Outpatient Diabetes medications: Lantus  40 units daily, Novolog  4 units TID with meals, Ozempic 2 mg Qweek (Sunday; recently started and only took for 2 weeks then stopped due to hypoglycemia for 3 days following injection), Jardiance  25 mg daily Current orders for Inpatient glycemic control: Semglee  20 units daily  Novolog  0-9 units q 4 hrs.  Inpatient Diabetes Program Recommendations:   Patient admitted in DKA-ran out of insulin . Noted patient left AMA on 08/19/23. Patient was followed by DM coordinator during this admission and planned for patient to receive insulin  and meds prior to discharge home. Will plan to speak with patient during admission.  Thank you, Holmes Hays E. Kalika Smay, RN, MSN, CDCES  Diabetes Coordinator Inpatient Glycemic Control Team Team Pager [phone] (8am-5pm) 08/31/2023 8:51 AM

## 2023-08-31 NOTE — Progress Notes (Signed)
 MRSA +, Protocol initiated

## 2023-08-31 NOTE — Plan of Care (Signed)
   Problem: Education: Goal: Ability to describe self-care measures that may prevent or decrease complications (Diabetes Survival Skills Education) will improve Outcome: Progressing Goal: Individualized Educational Video(s) Outcome: Progressing   Problem: Coping: Goal: Ability to adjust to condition or change in health will improve Outcome: Progressing

## 2023-08-31 NOTE — Discharge Summary (Signed)
 Physician Discharge Summary   Patient: Michaela Johnson MRN: [id number] DOB: January 26, 1961  Admit date:     08/31/2023  Discharge date: 09/01/23  Discharge Physician: Pennie Banter   PCP: Sandrea Hughs, NP   Recommendations at discharge:   Follow up with Primary Care Follow up on glycemic control and adjust diabetes regimen as needed. Repeat CBC, BMP at follow up  Discharge Diagnoses: Active Problems:   Influenza A  Resolved Problems:   DKA (diabetic ketoacidosis) [facility])  Hospital Course:  HPI on admission 08/30/2023: "Michaela Johnson is a 63 y.o. female with medical history significant of IDDM, recently diagnosed right foot osteomyelitis and MRSA bacteremia on Zyvox, PAF on Xarelto, peripheral neuropathy, GERD, cocaine abuse, presented with nauseous vomiting malaise dehydration...   ED Course: Tachycardia, stable blood pressure not hypoxic afebrile.  Blood work showed glucose 296, creatinine 0.9, bicarb 15, anion gap 20, K5.2, VBG 7.28/29/51 Patient was given 1 L of IV bolus and started on Endo tool insulin drip"     Pt was admitted to stepdown unit on insulin drip for DKA. Subsequently, pt found positive for influenza A.     2/10 -- pt transitioned off IV insulin this AM with sugars in 130's to 150's.  Planned for discharge home based on charted blood sugars, then informed repeat finger stick of 505.  Discharge cancelled for today pending repeat BMP to assess for DKA recurrence.   2/11- -- pt doing well, feeling much better and denies any complaints today.  Glycemic control is improved.  Pt is medically stable for discharge today and agreeable with the plan.  Assessment and Plan: No notes have been filed under this hospital service. Service: Hospitalist        Consultants: None Procedures performed: None  Disposition: Home Diet recommendation:  Discharge Diet Orders (From admission, onward)     Start     Ordered   08/31/23 0000  Diet - carb modified        08/31/23  1551            DISCHARGE MEDICATION: Allergies as of 09/01/2023       Reactions   Glipizide    Other reaction(s): Other (See Comments)   Metformin Diarrhea   Codeine Itching   Levaquin [levofloxacin In D5w] Itching   Penicillins         Medication List     STOP taking these medications    metoprolol succinate 25 MG 24 hr tablet Commonly known as: TOPROL-XL   Ozempic (2 MG/DOSE) 8 MG/3ML Sopn Generic drug: Semaglutide (2 MG/DOSE)   Victoza 18 MG/3ML Sopn Generic drug: liraglutide       TAKE these medications    carvedilol 3.125 MG tablet Commonly known as: COREG Take 1 tablet (3.125 mg total) by mouth 2 (two) times daily with a meal.   FLUoxetine 40 MG capsule Commonly known as: PROZAC Take 40 mg by mouth daily.   gabapentin 300 MG capsule Commonly known as: NEURONTIN Take 900 mg by mouth at bedtime.   insulin glargine 100 UNIT/ML injection Commonly known as: LANTUS Inject 40 Units into the skin daily. What changed: Another medication with the same name was removed. Continue taking this medication, and follow the directions you see here.   Jardiance 25 MG Tabs tablet Generic drug: empagliflozin Take 25 mg by mouth daily.   linezolid 600 MG tablet Commonly known as: Zyvox Take 1 tablet (600 mg total) by mouth 2 (two) times daily for 21 days.   Max  Tussin Mucus & Chest Cong 200 MG/10ML liquid Generic drug: guaiFENesin Take 10 mLs by mouth every 4 (four) hours as needed for cough or to loosen phlegm.   multivitamin with minerals Tabs tablet Take 1 tablet by mouth daily.   Natroba 0.9 % Susp Generic drug: Spinosad Apply topically.   nicotine 21 mg/24hr patch Commonly known as: NICODERM CQ - dosed in mg/24 hours Place 1 patch (21 mg total) onto the skin daily as needed (nicotine craving).   NovoLOG FlexPen 100 UNIT/ML FlexPen Generic drug: insulin aspart Inject 5 Units into the skin 3 (three) times daily with meals. What changed:  how  much to take Another medication with the same name was removed. Continue taking this medication, and follow the directions you see here.   omeprazole 20 MG capsule Commonly known as: PRILOSEC Take 20 mg by mouth daily.   oseltamivir 75 MG capsule Commonly known as: TAMIFLU Take 1 capsule (75 mg total) by mouth 2 (two) times daily for 9 doses.   rivaroxaban 20 MG Tabs tablet Commonly known as: XARELTO Take 1 tablet (20 mg total) by mouth daily with supper.   Vitamin D 125 MCG (5000 UT) Caps Take 1 capsule by mouth daily.        Discharge Exam: Filed Weights   08/30/23 1508 08/30/23 2200  Weight: 52.2 kg 49.5 kg   General exam: awake, alert, no acute distress HEENT: atraumatic, clear conjunctiva, anicteric sclera, moist mucus membranes, hearing grossly normal  Respiratory system: CTAB, no wheezes, rales or rhonchi, normal respiratory effort. Cardiovascular system: normal S1/S2,  RRR, no pedal edema.   Gastrointestinal system: soft, NT, ND, no HSM felt, +bowel sounds. Central nervous system: A&O x4. no gross focal neurologic deficits, normal speech Extremities: moves all, no edema, normal tone Skin: dry, intact, normal temperature, normal color, No rashes, lesions or ulcers Psychiatry: normal mood, congruent affect, judgement and insight appear normal   Condition at discharge: stable  The results of significant diagnostics from this hospitalization (including imaging, microbiology, ancillary and laboratory) are listed below for reference.   Imaging Studies: US ARTERIAL ABI (SCREENING LOWER EXTREMITY) Result Date: 08/18/2023 CLINICAL DATA:  Diabetic ulcers and possible osteomyelitis of right toes. EXAM: NONINVASIVE PHYSIOLOGIC VASCULAR STUDY OF BILATERAL LOWER EXTREMITIES TECHNIQUE: Evaluation of both lower extremities were performed at rest, including calculation of ankle-brachial indices with single level Doppler, pressure and pulse volume recording. COMPARISON:  None  Available. FINDINGS: Right ABI:  1.05 Left ABI:  1.12 Right Lower Extremity: Biphasic/near triphasic posterior tibial and triphasic dorsalis pedis waveforms. Left Lower Extremity: Biphasic posterior tibial and triphasic dorsalis pedis waveforms. 1.0-1.4 Normal IMPRESSION: Normal resting bilateral ankle-brachial indices. Distal waveforms are essentially normal/near normal. Electronically Signed   By: Irish Lack M.D.   On: 08/18/2023 10:34   ECHOCARDIOGRAM COMPLETE Result Date: 08/17/2023    ECHOCARDIOGRAM REPORT   Patient Name:   FIORELLA HANAHAN Date of Exam: 08/16/2023 Medical Rec #:  [id number]     Height:       62.0 in Accession #:    [id number]    Weight:       115.3 lb Date of Birth:  1960-12-19     BSA:          1.513 m Patient Age:    62 years      BP:           103/40 mmHg Patient Gender: F             HR:  104 bpm. Exam Location:  [facility] Procedure: 2D Echo, Cardiac Doppler and Color Doppler Indications:     Bacteremia R78.81  History:         Patient has prior history of Echocardiogram examinations, most                  recent 01/03/2022. Arrythmias:Atrial Fibrillation; Risk                  Factors:Hypertension, Diabetes and Current Smoker.  Sonographer:     Lucendia Herrlich RCS Referring Phys:  [id number] AMIN Diagnosing Phys: Chilton Si MD IMPRESSIONS  1. Overall systolic funciton is preserved, however septal function is abnormal consistent with bundle branch block. Left ventricular ejection fraction, by estimation, is 55%. The left ventricle has normal function. The left ventricle demonstrates regional wall motion abnormalities (see scoring diagram/findings for description). Left ventricular diastolic parameters are consistent with Grade I diastolic dysfunction (impaired relaxation).  2. Right ventricular systolic function is normal. The right ventricular size is normal. There is normal pulmonary artery systolic pressure.  3. The mitral valve is normal in structure. No evidence of  mitral valve regurgitation. No evidence of mitral stenosis.  4. The aortic valve is tricuspid. Aortic valve regurgitation is not visualized. No aortic stenosis is present.  5. The inferior vena cava is normal in size with greater than 50% respiratory variability, suggesting right atrial pressure of 3 mmHg. FINDINGS  Left Ventricle: Overall systolic funciton is preserved, however septal function is abnormal consistent with bundle branch block. Left ventricular ejection fraction, by estimation, is 55%. The left ventricle has normal function. The left ventricle demonstrates regional wall motion abnormalities. The left ventricular internal cavity size was normal in size. There is no left ventricular hypertrophy. Abnormal (paradoxical) septal motion, consistent with left bundle branch block. Left ventricular diastolic parameters are consistent with Grade I diastolic dysfunction (impaired relaxation). Indeterminate filling pressures.  LV Wall Scoring: The entire septum is hypokinetic. The entire anterior wall, entire lateral wall, entire inferior wall, and apex are normal. Right Ventricle: The right ventricular size is normal. No increase in right ventricular wall thickness. Right ventricular systolic function is normal. There is normal pulmonary artery systolic pressure. The tricuspid regurgitant velocity is 1.47 m/s, and  with an assumed right atrial pressure of 3 mmHg, the estimated right ventricular systolic pressure is 11.6 mmHg. Left Atrium: Left atrial size was normal in size. Right Atrium: Right atrial size was normal in size. Pericardium: There is no evidence of pericardial effusion. Mitral Valve: The mitral valve is normal in structure. No evidence of mitral valve regurgitation. No evidence of mitral valve stenosis. Tricuspid Valve: The tricuspid valve is normal in structure. Tricuspid valve regurgitation is trivial. No evidence of tricuspid stenosis. Aortic Valve: The aortic valve is tricuspid. Aortic valve  regurgitation is not visualized. No aortic stenosis is present. Aortic valve peak gradient measures 8.5 mmHg. Pulmonic Valve: The pulmonic valve was normal in structure. Pulmonic valve regurgitation is not visualized. No evidence of pulmonic stenosis. Aorta: The aortic root is normal in size and structure. Venous: The inferior vena cava is normal in size with greater than 50% respiratory variability, suggesting right atrial pressure of 3 mmHg. IAS/Shunts: No atrial level shunt detected by color flow Doppler.  LEFT VENTRICLE PLAX 2D LVIDd:         3.60 cm   Diastology LVIDs:         2.60 cm   LV e' medial:    7.29 cm/s LV  PW:         0.80 cm   LV E/e' medial:  12.9 LV IVS:        0.80 cm   LV e' lateral:   15.30 cm/s LVOT diam:     2.00 cm   LV E/e' lateral: 6.1 LV SV:         43 LV SV Index:   28 LVOT Area:     3.14 cm                           3D Volume EF:                          3D EF:        55 % RIGHT VENTRICLE             IVC RV S prime:     13.10 cm/s  IVC diam: 2.00 cm TAPSE (M-mode): 2.0 cm LEFT ATRIUM             Index        RIGHT ATRIUM           Index LA diam:        3.30 cm 2.18 cm/m   RA Area:     13.50 cm LA Vol (A2C):   34.2 ml 22.61 ml/m  RA Volume:   34.20 ml  22.61 ml/m LA Vol (A4C):   41.5 ml 27.43 ml/m LA Biplane Vol: 39.7 ml 26.24 ml/m  AORTIC VALVE AV Area (Vmax): 1.80 cm AV Vmax:        146.00 cm/s AV Peak Grad:   8.5 mmHg LVOT Vmax:      83.80 cm/s LVOT Vmean:     58.700 cm/s LVOT VTI:       0.137 m  AORTA Ao Root diam: 3.10 cm Ao Asc diam:  3.00 cm MITRAL VALVE                TRICUSPID VALVE MV Area (PHT): 4.96 cm     TR Peak grad:   8.6 mmHg MV Decel Time: 153 msec     TR Vmax:        147.00 cm/s MV E velocity: 93.80 cm/s MV A velocity: 113.00 cm/s  SHUNTS MV E/A ratio:  0.83         Systemic VTI:  0.14 m                             Systemic Diam: 2.00 cm Chilton Si MD Electronically signed by Chilton Si MD Signature Date/Time: 08/17/2023/7:55:42 AM    Final (Updated)     MR FOOT RIGHT WO CONTRAST Result Date: 08/16/2023 CLINICAL DATA:  Foot swelling. Diabetes. Osteomyelitis suspected. Wound to third and fourth toes. EXAM: MRI OF THE RIGHT FOREFOOT WITHOUT CONTRAST TECHNIQUE: Multiplanar, multisequence MR imaging of the right forefoot was performed. No intravenous contrast was administered. COMPARISON:  Right forefoot radiographs 08/15/2023 FINDINGS: Despite efforts by the technologist and patient, moderate motion artifact is present on today's exam and could not be eliminated. This reduces exam sensitivity and specificity. Bones/Joint/Cartilage There is moderate marrow edema within the distal 2/3 of the distal phalanx of the great toe. There is moderate marrow edema within the shaft and distal aspect of the proximal phalanx of the fourth toe (sagittal series 7, image 14 and coronal series 8, image 12)  and adjacent proximal aspect of the middle phalanx of the fourth toe (sagittal series image 13). On a single sagittal STIR image there is question of marrow edema within the lateral aspect of the proximal phalanx of third toe (sagittal series 7, image 17), however normal signal is seen 1 slice more medially within the proximal phalanx (sagittal series 7, image 18), and no abnormal signal is seen in this region on coronal or axial images. Possible trace marrow edema within the distal phalanx of the fifth toe (sagittal series 7, image 10). Mild to moderate great toe metatarsophalangeal cartilage thinning and subchondral cystic change. There is transverse linear decreased T1 increased T2 signal within the fourth metatarsal neck suspicious for a fracture. There is no significant surrounding marrow edema (sagittal series 7, images 15 and 16, coronal series 5 images 17 and 18, coronal series 8 images 14 through 16), and this is likely subacute. Ligaments The Lisfranc ligament complex appears intact. The metatarsophalangeal collateral ligaments appear grossly intact, within limitation  of motion artifact. Muscles and Tendons Mild edema within the intrinsic plantar greater than dorsal midfoot to forefoot musculature. No flexor or extensor tendon tear is seen. Soft tissues Reportedly the patient has a wound of the third and fourth toes. There is mild soft tissue edema at the plantar aspect of the distal third through fifth toes. There appears to be thinning of the skin dorsal to the distal aspect of the proximal phalanx and the proximal aspect of the middle phalanx of the fourth toe (sagittal series 7, image 14), suspicious for a wound. IMPRESSION: 1. Moderate marrow edema within the distal 2/3 of the distal phalanx of the great toe. Recommend clinical correlation for any soft tissue infection concerns within the great toe. The marrow edema is nonspecific and may be reactive, however it is difficult to exclude early osteomyelitis. 2. Moderate marrow edema within the shaft and distal aspect of the proximal phalanx of the fourth toe and adjacent proximal aspect of the middle phalanx of the fourth toe. There appears to be thinning of the skin dorsal to the distal aspect of the proximal phalanx and proximal aspect of the middle phalanx of the fourth toe, reportedly there is a fourth toe wound. Findings are suspicious for early osteomyelitis. 3. Possible trace marrow edema within the distal phalanx of the fifth toe. This is nonspecific and could be reactive or represent early osteomyelitis. Recommend clinical correlation 4. Transverse linear decreased T1 increased T2 signal within the fourth metatarsal neck suspicious for a nondisplaced fracture. There is no significant surrounding marrow edema, and this is likely subacute. Electronically Signed   By: Neita Garnet M.D.   On: 08/16/2023 13:32   DG Foot 2 Views Right Result Date: 08/15/2023 CLINICAL DATA:  Wound to the third and fourth toes EXAM: RIGHT FOOT - 2 VIEW COMPARISON:  None Available. FINDINGS: Partially imaged postsurgical changes of the  tibia and hindfoot. Intramedullary rod extends beyond the plantar calcaneus. Screw traversing the calcaneus is fractured. There is no evidence of fracture or dislocation. Diffuse osteopenia and severe degenerative changes of the ankle and hindfoot with collapse of the calcaneus. Mild soft tissue swelling of the great, third, and fourth toes. IMPRESSION: 1. Soft tissue swelling of the first, third, and fourth toes. No radiographic evidence of osteomyelitis. 2. Diffuse osteopenia and severe degenerative changes of the ankle and hindfoot with collapse of the calcaneus. Electronically Signed   By: Agustin Cree M.D.   On: 08/15/2023 16:14   Portable chest x-ray (  1 view) Result Date: 08/15/2023 CLINICAL DATA:  Diabetic ketoacidosis.  Nausea and vomiting. EXAM: PORTABLE CHEST 1 VIEW COMPARISON:  Chest radiograph dated June 16, 2022. FINDINGS: The heart size and mediastinal contours are within normal limits. Aortic atherosclerosis. No focal consolidation, pleural effusion, or pneumothorax. Similar mild chronic reticular changes at the lung bases. Remote right-sided rib fractures. Chronic posttraumatic deformity of the left clavicle. No acute osseous abnormality identified. IMPRESSION: No acute cardiopulmonary findings. Electronically Signed   By: Hart Robinsons M.D.   On: 08/15/2023 14:34    Microbiology: Results for orders placed or performed during the hospital encounter of 08/30/23  Resp panel by RT-PCR (RSV, Flu A&B, Covid) Anterior Nasal Swab     Status: Abnormal   Collection Time: 08/30/23  7:32 AM   Specimen: Anterior Nasal Swab  Result Value Ref Range Status   SARS Coronavirus 2 by RT PCR NEGATIVE NEGATIVE Final    Comment: (NOTE) SARS-CoV-2 target nucleic acids are NOT DETECTED.  The SARS-CoV-2 RNA is generally detectable in upper respiratory specimens during the acute phase of infection. The lowest concentration of SARS-CoV-2 viral copies this assay can detect is 138 copies/mL. A negative  result does not preclude SARS-Cov-2 infection and should not be used as the sole basis for treatment or other patient management decisions. A negative result may occur with  improper specimen collection/handling, submission of specimen other than nasopharyngeal swab, presence of viral mutation(s) within the areas targeted by this assay, and inadequate number of viral copies(<138 copies/mL). A negative result must be combined with clinical observations, patient history, and epidemiological information. The expected result is Negative.  Fact Sheet for Patients:  BloggerCourse.com  Fact Sheet for Healthcare Providers:  SeriousBroker.it  This test is no t yet approved or cleared by the [place] FDA and  has been authorized for detection and/or diagnosis of SARS-CoV-2 by FDA under an Emergency Use Authorization (EUA). This EUA will remain  in effect (meaning this test can be used) for the duration of the COVID-19 declaration under Section 564(b)(1) of the Act, 21 U.S.C.section 360bbb-3(b)(1), unless the authorization is terminated  or revoked sooner.       Influenza A by PCR POSITIVE (A) NEGATIVE Final   Influenza B by PCR NEGATIVE NEGATIVE Final    Comment: (NOTE) The Xpert Xpress SARS-CoV-2/FLU/RSV plus assay is intended as an aid in the diagnosis of influenza from Nasopharyngeal swab specimens and should not be used as a sole basis for treatment. Nasal washings and aspirates are unacceptable for Xpert Xpress SARS-CoV-2/FLU/RSV testing.  Fact Sheet for Patients: BloggerCourse.com  Fact Sheet for Healthcare Providers: SeriousBroker.it  This test is not yet approved or cleared by the [place] FDA and has been authorized for detection and/or diagnosis of SARS-CoV-2 by FDA under an Emergency Use Authorization (EUA). This EUA will remain in effect (meaning this test can be  used) for the duration of the COVID-19 declaration under Section 564(b)(1) of the Act, 21 U.S.C. section 360bbb-3(b)(1), unless the authorization is terminated or revoked.     Resp Syncytial Virus by PCR NEGATIVE NEGATIVE Final    Comment: (NOTE) Fact Sheet for Patients: BloggerCourse.com  Fact Sheet for Healthcare Providers: SeriousBroker.it  This test is not yet approved or cleared by the [place] FDA and has been authorized for detection and/or diagnosis of SARS-CoV-2 by FDA under an Emergency Use Authorization (EUA). This EUA will remain in effect (meaning this test can be used) for the duration of the COVID-19 declaration under Section 564(b)(1)  of the Act, 21 U.S.C. section 360bbb-3(b)(1), unless the authorization is terminated or revoked.  Performed at [facility], [address] [place], [place] [address]   MRSA Next Gen by PCR, Nasal     Status: Abnormal   Collection Time: 08/30/23 11:01 PM   Specimen: Nasal Mucosa; Nasal Swab  Result Value Ref Range Status   MRSA by PCR Next Gen DETECTED (A) NOT DETECTED Final    Comment: CRITICAL RESULT CALLED TO, READ BACK BY AND VERIFIED WITH: MABIF ARTIF RN @ [phone] 08/31/23 [facility] (NOTE) The GeneXpert MRSA Assay (FDA approved for NASAL specimens only), is one component of a comprehensive MRSA colonization surveillance program. It is not intended to diagnose MRSA infection nor to guide or monitor treatment for MRSA infections. Test performance is not FDA approved in patients less than 16 years old. Performed at [facility], [address] [place], [place] [address]     Labs: CBC: Recent Labs  Lab 08/30/23 0719  WBC 9.2  HGB 14.5  HCT 43.9  MCV 94.0  PLT 325   Basic Metabolic Panel: Recent Labs  Lab 08/30/23 1813 08/30/23 2235 08/31/23 0445 08/31/23 1707 09/01/23 0437  NA 128* 130* 133* 128* 131*  K 4.7 4.6 3.8 5.0 4.0  CL 96* 98 102  95* 97*  CO2 21* 22 26 25 27   GLUCOSE 307* 157* 153* 458* 133*  BUN 19 16 14 18 18   CREATININE 0.91 0.71 0.60 0.69 0.59  CALCIUM 8.0* 8.2* 8.1* 8.2* 8.0*   Liver Function Tests: Recent Labs  Lab 08/30/23 0719  AST 18  ALT 11  ALKPHOS 94  BILITOT 0.9  PROT 8.8*  ALBUMIN 3.3*   CBG: Recent Labs  Lab 08/31/23 1957 09/01/23 0031 09/01/23 0400 09/01/23 0821 09/01/23 1129  GLUCAP 231* 152* 115* 297* 305*    Discharge time spent: less than 30 minutes.  Signed: Pennie Banter, DO [facility] 09/01/2023

## 2023-08-31 NOTE — Progress Notes (Addendum)
 Progress Note   Patient: Michaela Johnson VFI:[id number] DOB: [birth year] DOA: 08/30/2023     1 DOS: the patient was seen and examined on 08/31/2023   Brief hospital course: HPI on admission 08/30/2023: "Vestie Sullins is a 63 y.o. female with medical history significant of IDDM, recently diagnosed right foot osteomyelitis and MRSA bacteremia on Zyvox , PAF on Xarelto , peripheral neuropathy, GERD, cocaine abuse, presented with nauseous vomiting malaise dehydration...   ED Course: Tachycardia, stable blood pressure not hypoxic afebrile.  Blood work showed glucose 296, creatinine 0.9, bicarb 15, anion gap 20, K5.2, VBG 7.28/29/51 Patient was given 1 L of IV bolus and started on Endo tool insulin  drip"   Pt was admitted to stepdown unit on insulin  drip for DKA. Subsequently, pt found positive for influenza A.   2/10 -- pt transitioned off IV insulin  this AM with sugars in 130's to 150's.  Planned for discharge home based on charted blood sugars, then informed repeat finger stick of 505.  Discharge cancelled for today pending repeat BMP to assess for DKA recurrence.    Assessment and Plan:  DKA - due to non-compliance with insulin  (pt ran out) and influenza A infection. --Transitioned off insulin  drip this AM with DKA resolved, sugars 130-150's --Basal 20 units q24h (pharm med history shows 20 units daily lantus ) --Sliding scale Novolog  --Holding Jardiance  (note side effect of ketoacidosis, consider stopping if DKA recurs despite compliance with insulin ) --Serial BMP's  4pm - CBG 505 -- repeat BMP.   Novolog  20 units x 1.   Monitor closely.   Will resume insulin  drip if DKA recurrent.  Influenza A infection --Start Tamiflu  --Cough meds & supportive care PRN per orders   Right foot osteomyelitis involving first, fourth and fifth toes Recent MRSA bacteremia --Wound appears to be chronic and no signs of worsening infection.   Patient reports that she has been compliant with Zyvox  twice  daily, and last dose will be February 19. --Continue Zyvox    Cocaine abuse --Metoprolol  was changed to Coreg  --TOC consulted and has added rehab facilities to patient's AVS   Paroxysmal A-Fib Sinus tachycardia on admission --Changed metoprolol  >> Coreg  given the history of cocaine abuse --Continue Xarelto    Nicotine  dependence Counseled on cessation --Continue nicotine  patch      Subjective: Pt seen in stepdown unit this AM.  Reports feeling much better.  Has some cough.  No other complaints.  Asks to talk to social worker, wants to go to rehab to get over drug problems.   Physical Exam: Vitals:   08/31/23 0900 08/31/23 1000 08/31/23 1100 08/31/23 1200  BP:  120/79 (!) 111/52 (!) 126/92  Pulse: (!) 116 (!) 103 95 88  Resp:      Temp: 98.2 F (36.8 C)     TempSrc: Oral     SpO2: (!) 84% 96% (!) 85% 94%  Weight:      Height:       General exam: awake, alert, no acute distress, chronically ill appearing HEENT: moist mucus membranes, hearing grossly normal  Respiratory system: CTAB, intermittent wheezes, no rales or rhonchi, normal respiratory effort. Cardiovascular system: normal S1/S2, RRR, no pedal edema.   Gastrointestinal system: soft, NT, ND, no HSM felt, +bowel sounds. Central nervous system: A&O x 3. no gross focal neurologic deficits, normal speech Extremities: moves all, no edema, normal tone Skin: dry, intact, normal temperature, normal color, No rashes, lesions or ulcers Psychiatry: normal mood, congruent affect, judgement and insight appear normal   Data Reviewed:  Notable  labs ---   Na 133 Glucose 153 Ca 8.1 Otherwise normal BMP.  Gap closed x 3 as of AM BMP  Family Communication: None. Pt is capable to update.  Disposition: Status is: Inpatient Remains inpatient appropriate because: recurrent hyperglycemia needs close monitoring to prevent recurrence of DKA and readmission    Planned Discharge Destination: Home    Time spent: 45  minutes  Author: Montey Apa, DO 08/31/2023 4:06 PM  For on call review [email or web address].

## 2023-09-01 ENCOUNTER — Other Ambulatory Visit: Payer: Self-pay

## 2023-09-01 LAB — BASIC METABOLIC PANEL
Anion gap: 7 (ref 5–15)
BUN: 18 mg/dL (ref 8–23)
CO2: 27 mmol/L (ref 22–32)
Calcium: 8 mg/dL — ABNORMAL LOW (ref 8.9–10.3)
Chloride: 97 mmol/L — ABNORMAL LOW (ref 98–111)
Creatinine, Ser: 0.59 mg/dL (ref 0.44–1.00)
GFR, Estimated: >60 mL/min (ref >60)
Glucose, Bld: 133 mg/dL — ABNORMAL HIGH (ref 70–99)
Potassium: 4 mmol/L (ref 3.5–5.1)
Sodium: 131 mmol/L — ABNORMAL LOW (ref 135–145)

## 2023-09-01 LAB — GLUCOSE, CAPILLARY
Glucose-Capillary: 115 mg/dL — ABNORMAL HIGH (ref 70–99)
Glucose-Capillary: 152 mg/dL — ABNORMAL HIGH (ref 70–99)
Glucose-Capillary: 297 mg/dL — ABNORMAL HIGH (ref 70–99)
Glucose-Capillary: 305 mg/dL — ABNORMAL HIGH (ref 70–99)

## 2023-09-01 MED ORDER — NOVOLOG FLEXPEN 100 UNIT/ML ~~LOC~~ SOPN
5.0000 [IU] | PEN_INJECTOR | Freq: Three times a day (TID) | SUBCUTANEOUS | 1 refills | Status: DC
  Filled 2023-09-01: qty 15, 100d supply, fill #0

## 2023-09-01 NOTE — Plan of Care (Signed)

## 2023-09-01 NOTE — Inpatient Diabetes Management (Signed)
Inpatient Diabetes Program Recommendations  AACE/ADA: New Consensus Statement on Inpatient Glycemic Control (2015)  Target Ranges:  Prepandial:   less than 140 mg/dL      Peak postprandial:   less than 180 mg/dL (1-2 hours)      Critically ill patients:  140 - 180 mg/dL   Lab Results  Component Value Date   GLUCAP 297 (H) 09/01/2023   HGBA1C 9.8 (H) 08/16/2023    Latest Reference Range & Units 08/31/23 07:29 08/31/23 16:00 08/31/23 16:02 08/31/23 19:57 09/01/23 00:31 09/01/23 04:00 09/01/23 08:21  Glucose-Capillary 70 - 99 mg/dL 161 (H) 096 (HH) 045 (H) 231 (H) 152 (H) 115 (H) 297 (H)  (HH): Data is critically high (H): Data is abnormally high  Diabetes history: DM2 Outpatient Diabetes medications: Lantus 40 units daily, Novolog 4 units TID with meals, Ozempic 2 mg Qweek (Sunday; recently started and only took for 2 weeks then stopped due to hypoglycemia for 3 days following injection), Jardiance 25 mg daily Current orders for Inpatient glycemic control: Semglee 20 units bid Novolog 4 units tid meal coverage  Novolog 0-9 units tid, 0-5 units hs  Inpatient Diabetes Program Recommendations:   Agree with holding Jardiance. Spoke with patient regarding last discharge with prescriptions and then readmitted due to running out of insulin. Pt states her car is in the garage for repairs (cost to get out is $1600), so she gave a friend money to pick up her prescriptions. Person kept the money and didn't pick up her medications. Requested list of transportation services available from [facility] if a list is available. Patient states insulin was brought to her room yesterday for home use and not on ice although patient inquired. Discussed with RN Susy Manor and plans to place insulin on ice in room to keep cool until patient is discharged.  Thank you, Billy Fischer. Cleon Thoma, RN, MSN, CDCES  Diabetes Coordinator Inpatient Glycemic Control Team Team Pager [phone] (8am-5pm) 09/01/2023 9:45 AM

## 2023-09-01 NOTE — TOC Initial Note (Signed)
 Transition of Care [facility]) - Initial/Assessment Note    Patient Details  Name: Michaela Johnson MRN: [id number] Date of Birth: [birth year]  Transition of Care [facility]) CM/SW Contact:    Chapman Fitch, RN Phone Number: 09/01/2023, 9:52 AM  Clinical Narrative:                      Patient to discharge today Patient states at discharge she will return to her home where she lives at home alone.  She states after discharge she plans to review inpatient rehab facilities and reach out to them.  The list has been provided to patient.   Patient requesting cab voucher.  She states that she does not have anyone available to pick her up from the hospital, and does not have the financial means to pay for her own cab. Rider waiver already on   [employer] at [phone] information added to her AVS so she can arrange transport to her follow up appointments at discharge. She is aware that she is responsible for arranging her own appointments.     Patient Goals and CMS Choice            Expected Discharge Plan and Services         Expected Discharge Date: 09/01/23                                    Prior Living Arrangements/Services                       Activities of Daily Living   ADL Screening (condition at time of admission) Independently performs ADLs?: No Does the patient have a NEW difficulty with bathing/dressing/toileting/self-feeding that is expected to last >3 days?: No Does the patient have a NEW difficulty with getting in/out of bed, walking, or climbing stairs that is expected to last >3 days?: No Does the patient have a NEW difficulty with communication that is expected to last >3 days?: No Is the patient deaf or have difficulty hearing?: No Does the patient have difficulty seeing, even when wearing glasses/contacts?: No Does the patient have difficulty concentrating, remembering, or making decisions?: No  Permission Sought/Granted                   Emotional Assessment              Admission diagnosis:  DKA (diabetic ketoacidosis) (HCC) [E11.10] Diabetic ketoacidosis without coma associated with type 1 diabetes mellitus (HCC) [E10.10] Patient Active Problem List   Diagnosis Date Noted   Influenza A 08/31/2023   Acute osteomyelitis of toe, right (HCC) 08/18/2023   MRSA bacteremia 08/16/2023   High anion gap metabolic acidosis 08/15/2023   Intractable nausea and vomiting 08/15/2023   Alopecia of scalp 08/15/2023   Cellulitis of scalp 08/15/2023   Cellulitis of right toe 08/15/2023   AKI (acute kidney injury) (HCC) 08/15/2023   Elevated troponin 08/15/2023   Abscess of nasal cavity 06/16/2023   Uncontrolled type 2 diabetes mellitus with hyperglycemia, with long-term current use of insulin (HCC) 06/16/2023   Toe ulcer (HCC) 06/16/2023   Hyponatremia 06/16/2023   Hypokalemia 06/16/2023   Leukocytosis 82/95/6213   Head lice 06/15/2023   Metabolic acidosis 06/14/2022   Iatrogenic pneumothorax 06/14/2022   History of GI bleed 06/03/2022   Cirrhosis of liver (HCC) 06/03/2022   Cocaine use 06/03/2022   Diabetic ketoacidosis (HCC)  06/01/2022   Smoker 05/01/2022   Marijuana use 05/01/2022   Depression, major, single episode, complete remission (HCC) dx'd 15 years ago 05/01/2022   Acute postoperative anemia due to expected blood loss 01/05/2022   Closed right radial fracture 01/03/2022   Closed intertrochanteric fracture of hip, right, initial encounter (HCC) 01/02/2022   Acute kidney injury (HCC) 04/18/2021   Hyperkalemia 04/18/2021   Multiple lung nodules on CT 04/18/2021   Vitamin D deficiency    Neuropathy    Chronic anticoagulation 07/31/2020   Chronic alcohol use 07/31/2020   Nicotine dependence 07/31/2020   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), [place] - [address] Jerilynn M[place]) [place] [address] Phone: [phone] Fax: [phone]  [facility] [address] [place] [address] Phone: [phone] Fax: [phone]     Social Drivers of Health (SDOH) Social History: SDOH Screenings   Food Insecurity: Food Insecurity Present (08/31/2023)  Housing: High Risk (08/31/2023)  Transportation Needs: Unmet Transportation Needs (08/31/2023)  Utilities: At Risk (08/31/2023)  Tobacco Use: High Risk (08/30/2023)   SDOH Interventions:     Readmission Risk Interventions    09/01/2023    9:52 AM 08/18/2023   12:17 PM 06/16/2022   11:54 AM  Readmission Risk Prevention Plan  Transportation Screening Complete Complete Complete  PCP or Specialist Appt within 5-7 Days  Complete   Home Care Screening  Complete   Medication Review (RN CM)  Complete   HRI or Home Care Consult --    Social Work Consult for Recovery Care Planning/Counseling Complete    Palliative Care Screening Not Applicable    Medication Review Oceanographer) Complete  Complete  PCP or Specialist appointment within 3-5 days of discharge   Complete  HRI or Home Care Consult   Complete  SW Recovery Care/Counseling Consult   Not Complete  SW Consult Not Complete Comments   NA  Palliative Care Screening   Not Applicable  Skilled Nursing Facility   Not Applicable

## 2023-09-01 NOTE — Progress Notes (Signed)
Patient received discharge papers and has her home lantus that the hospital pharmacy supplied. Patient sent out via wheelchair with belongings to waiting cab

## 2023-09-04 ENCOUNTER — Telehealth: Payer: Self-pay | Admitting: Infectious Diseases

## 2023-09-04 NOTE — Telephone Encounter (Signed)
Pt missed her appt with me on 08/27/23- Both my office staff and myself have been trying to reach her but unable to Her home phone has a voice mail and I left a generic message Her cell phone does not accept calls Her contact phone number is her home phone She was recently hospitalized from hospital 1/25-/129/25  for MRSA bacteremia,rttoe ulcer with MRSA and poory controlled DM. she left against medical advise but we did give her antibiotics to treat MRSA  linezolid 600mg  Po BID for 3 weeks She was to have weekly labs and see me in my clinic on 2/6 which she did not keep She did come back to ED on 2/9 with vomiting and was in DKA CBC was good- with no leucopenia or thrombocytopenia which is a risk associated with linezolid  Latest Reference Range & Units 08/30/23 07:19  WBC 4.0 - 10.5 K/uL 9.2  RBC 3.87 - 5.11 MIL/uL 4.67  Hemoglobin 12.0 - 15.0 g/dL 09.8  HCT 11.9 - 14.7 % 43.9  MCV 80.0 - 100.0 fL 94.0  MCH 26.0 - 34.0 pg 31.0  MCHC 30.0 - 36.0 g/dL 82.9  RDW 56.2 - 13.0 % 12.8  Platelets 150 - 400 K/uL 325  nRBC 0.0 - 0.2 % 0.0    CMP  Latest Reference Range & Units 08/30/23 07:19  Sodium 135 - 145 mmol/L 128 (L)  Potassium 3.5 - 5.1 mmol/L 5.2 (H)  Chloride 98 - 111 mmol/L 89 (L)  CO2 22 - 32 mmol/L 15 (L)  Glucose 70 - 99 mg/dL 865 (H)  BUN 8 - 23 mg/dL 19  Creatinine 7.84 - 6.96 mg/dL 2.95  Calcium 8.9 - 28.4 mg/dL 9.5  Anion gap 5 - 15  24 (H)    09/01/23  Latest Reference Range & Units 09/01/23 04:37  Sodium 135 - 145 mmol/L 131 (L)  Potassium 3.5 - 5.1 mmol/L 4.0  Chloride 98 - 111 mmol/L 97 (L)  CO2 22 - 32 mmol/L 27  Glucose 70 - 99 mg/dL 132 (H)  BUN 8 - 23 mg/dL 18  Creatinine 4.40 - 1.02 mg/dL 7.25  Calcium 8.9 - 36.6 mg/dL 8.0 (L)  Anion gap 5 - 15  7   Hospitalist note states that she was compliant with linezolid and will complete it on 09/09/23  I called her again today but could not reach her Will communicate with her PCP to see whether she can see her  and check her foot and labs ( CBC/CMP) next week

## 2023-10-07 ENCOUNTER — Other Ambulatory Visit: Payer: Self-pay

## 2023-12-25 ENCOUNTER — Encounter: Payer: Self-pay | Admitting: Cardiovascular Disease

## 2024-02-05 ENCOUNTER — Inpatient Hospital Stay
Admission: EM | Admit: 2024-02-05 | Discharge: 2024-02-08 | DRG: 871 | Disposition: A | Discharge status: Home / Self Care | Attending: Internal Medicine | Admitting: Internal Medicine

## 2024-02-05 ENCOUNTER — Emergency Department

## 2024-02-05 DIAGNOSIS — E1159 Type 2 diabetes mellitus with other circulatory complications: Secondary | ICD-10-CM | POA: Diagnosis present

## 2024-02-05 DIAGNOSIS — E1110 Type 2 diabetes mellitus with ketoacidosis without coma: Principal | ICD-10-CM | POA: Diagnosis present

## 2024-02-05 DIAGNOSIS — Z91148 Patient's other noncompliance with medication regimen for other reason: Secondary | ICD-10-CM

## 2024-02-05 DIAGNOSIS — R739 Hyperglycemia, unspecified: Secondary | ICD-10-CM

## 2024-02-05 DIAGNOSIS — Z7901 Long term (current) use of anticoagulants: Secondary | ICD-10-CM

## 2024-02-05 DIAGNOSIS — E1140 Type 2 diabetes mellitus with diabetic neuropathy, unspecified: Secondary | ICD-10-CM | POA: Diagnosis present

## 2024-02-05 DIAGNOSIS — A419 Sepsis, unspecified organism: Principal | ICD-10-CM | POA: Diagnosis present

## 2024-02-05 DIAGNOSIS — Z79899 Other long term (current) drug therapy: Secondary | ICD-10-CM

## 2024-02-05 DIAGNOSIS — R9431 Abnormal electrocardiogram [ECG] [EKG]: Secondary | ICD-10-CM

## 2024-02-05 DIAGNOSIS — F17210 Nicotine dependence, cigarettes, uncomplicated: Secondary | ICD-10-CM | POA: Diagnosis present

## 2024-02-05 DIAGNOSIS — K219 Gastro-esophageal reflux disease without esophagitis: Secondary | ICD-10-CM | POA: Diagnosis present

## 2024-02-05 DIAGNOSIS — F1020 Alcohol dependence, uncomplicated: Secondary | ICD-10-CM | POA: Diagnosis present

## 2024-02-05 DIAGNOSIS — F17200 Nicotine dependence, unspecified, uncomplicated: Secondary | ICD-10-CM | POA: Diagnosis present

## 2024-02-05 DIAGNOSIS — F32A Depression, unspecified: Secondary | ICD-10-CM | POA: Diagnosis present

## 2024-02-05 DIAGNOSIS — N179 Acute kidney failure, unspecified: Secondary | ICD-10-CM | POA: Diagnosis present

## 2024-02-05 DIAGNOSIS — I10 Essential (primary) hypertension: Secondary | ICD-10-CM | POA: Diagnosis present

## 2024-02-05 DIAGNOSIS — B009 Herpesviral infection, unspecified: Secondary | ICD-10-CM | POA: Diagnosis present

## 2024-02-05 DIAGNOSIS — E559 Vitamin D deficiency, unspecified: Secondary | ICD-10-CM | POA: Diagnosis present

## 2024-02-05 DIAGNOSIS — F1910 Other psychoactive substance abuse, uncomplicated: Secondary | ICD-10-CM | POA: Diagnosis present

## 2024-02-05 DIAGNOSIS — E875 Hyperkalemia: Secondary | ICD-10-CM | POA: Diagnosis present

## 2024-02-05 DIAGNOSIS — I480 Paroxysmal atrial fibrillation: Secondary | ICD-10-CM | POA: Diagnosis present

## 2024-02-05 DIAGNOSIS — Z794 Long term (current) use of insulin: Secondary | ICD-10-CM

## 2024-02-05 DIAGNOSIS — E1165 Type 2 diabetes mellitus with hyperglycemia: Secondary | ICD-10-CM

## 2024-02-05 DIAGNOSIS — E872 Acidosis, unspecified: Secondary | ICD-10-CM

## 2024-02-05 DIAGNOSIS — Z7984 Long term (current) use of oral hypoglycemic drugs: Secondary | ICD-10-CM

## 2024-02-05 DIAGNOSIS — F1410 Cocaine abuse, uncomplicated: Secondary | ICD-10-CM | POA: Diagnosis present

## 2024-02-05 DIAGNOSIS — E860 Dehydration: Secondary | ICD-10-CM | POA: Diagnosis present

## 2024-02-05 DIAGNOSIS — Z833 Family history of diabetes mellitus: Secondary | ICD-10-CM

## 2024-02-05 DIAGNOSIS — F410 Panic disorder [episodic paroxysmal anxiety] without agoraphobia: Secondary | ICD-10-CM | POA: Diagnosis present

## 2024-02-05 DIAGNOSIS — Z981 Arthrodesis status: Secondary | ICD-10-CM

## 2024-02-05 DIAGNOSIS — L03213 Periorbital cellulitis: Secondary | ICD-10-CM | POA: Diagnosis present

## 2024-02-05 DIAGNOSIS — J449 Chronic obstructive pulmonary disease, unspecified: Secondary | ICD-10-CM | POA: Diagnosis present

## 2024-02-05 DIAGNOSIS — F411 Generalized anxiety disorder: Secondary | ICD-10-CM | POA: Diagnosis present

## 2024-02-05 DIAGNOSIS — L732 Hidradenitis suppurativa: Secondary | ICD-10-CM | POA: Diagnosis present

## 2024-02-05 DIAGNOSIS — Z8249 Family history of ischemic heart disease and other diseases of the circulatory system: Secondary | ICD-10-CM

## 2024-02-05 HISTORY — DX: Cocaine abuse, uncomplicated: F14.10

## 2024-02-05 HISTORY — DX: Chronic obstructive pulmonary disease, unspecified: J44.9

## 2024-02-05 LAB — CBC WITH DIFFERENTIAL/PLATELET
Abs Immature Granulocytes: 0.14 K/uL — ABNORMAL HIGH (ref 0.00–0.07)
Basophils Absolute: 0.1 K/uL (ref 0.0–0.1)
Basophils Relative: 0 %
Eosinophils Absolute: 0 K/uL (ref 0.0–0.5)
Eosinophils Relative: 0 %
HCT: 43.9 % (ref 36.0–46.0)
Hemoglobin: 13.5 g/dL (ref 12.0–15.0)
Immature Granulocytes: 1 %
Lymphocytes Relative: 3 %
Lymphs Abs: 0.5 K/uL — ABNORMAL LOW (ref 0.7–4.0)
MCH: 29.9 pg (ref 26.0–34.0)
MCHC: 30.8 g/dL (ref 30.0–36.0)
MCV: 97.3 fL (ref 80.0–100.0)
Monocytes Absolute: 0.3 K/uL (ref 0.1–1.0)
Monocytes Relative: 2 %
Neutro Abs: 16.1 K/uL — ABNORMAL HIGH (ref 1.7–7.7)
Neutrophils Relative %: 94 %
Platelets: 367 K/uL (ref 150–400)
RBC: 4.51 MIL/uL (ref 3.87–5.11)
RDW: 12.8 % (ref 11.5–15.5)
Smear Review: NORMAL
WBC: 17.2 K/uL — ABNORMAL HIGH (ref 4.0–10.5)
nRBC: 0 % (ref 0.0–0.2)

## 2024-02-05 LAB — BLOOD GAS, VENOUS
Acid-base deficit: 21.3 mmol/L — ABNORMAL HIGH (ref 0.0–2.0)
Bicarbonate: 7.5 mmol/L — ABNORMAL LOW (ref 20.0–28.0)
O2 Saturation: 72.4 %
Patient temperature: 37
pCO2, Ven: 26 mmHg — ABNORMAL LOW (ref 44–60)
pH, Ven: 7.07 — CL (ref 7.25–7.43)
pO2, Ven: 49 mmHg — ABNORMAL HIGH (ref 32–45)

## 2024-02-05 LAB — BETA-HYDROXYBUTYRIC ACID: Beta-Hydroxybutyric Acid: >8 mmol/L — ABNORMAL HIGH (ref 0.05–0.27)

## 2024-02-05 LAB — TROPONIN I (HIGH SENSITIVITY): Troponin I (High Sensitivity): 13 ng/L (ref <18)

## 2024-02-05 MED ORDER — SODIUM CHLORIDE 0.9 % IV SOLN
1.0000 g | Freq: Once | INTRAVENOUS | Status: AC
  Administered 2024-02-05: 1 g via INTRAVENOUS
  Filled 2024-02-05: qty 10

## 2024-02-05 MED ORDER — LACTATED RINGERS IV BOLUS
1000.0000 mL | Freq: Once | INTRAVENOUS | Status: AC
  Administered 2024-02-06: 1000 mL via INTRAVENOUS

## 2024-02-05 MED ORDER — LACTATED RINGERS IV BOLUS
1000.0000 mL | Freq: Once | INTRAVENOUS | Status: AC
  Administered 2024-02-05: 1000 mL via INTRAVENOUS

## 2024-02-05 MED ORDER — ONDANSETRON HCL 4 MG/2ML IJ SOLN: 4.0000 mg | Freq: Once | INTRAMUSCULAR | Status: DC

## 2024-02-05 NOTE — ED Provider Notes (Signed)
 ----------------------------------------- 11:05 PM on 02/05/2024 -----------------------------------------  Assuming care from Dr. Waymond.  In short, Michaela Johnson is a 63 y.o. female with a chief complaint of probable DKA and preseptal cellulitis.  Refer to the original H&P for additional details.  The current plan of care is to admit patient once labs are back.   Clinical Course as of 02/06/24 0049  Fri Feb 05, 2024  2219 DG Chest 1 View No active cardiopulmonary disease.  [TT]  2231 See the critical from respiratory, patient's VBG: ph 7.07, bicarb is 7 [TT]  2353 Called lab and spoke with the chemistry technician.  I explained that the labs will be very abnormal and I wanted them to call with the abnormal results rather than repeating them by protocol.  She said that she understood and would call the patient's nurse with the results as soon as they are available.  I also ordered a second liter of fluids. [CF]  2355 Beta-Hydroxybutyric Acid(!): >8.00 [CF]  2355 First troponin is normal, canceling second given no chest pain and given high probability of DKA [CF]  Sat Feb 06, 2024  0037 Still awaiting metabolic panel results.  Patient still nauseated, never received Zofran  but she has a prolonged QTc interval so I will hold off on medications that may affect this.  I ordered famotidine  20 mg IV that may help with nausea. [CF]  0041 Nurse Waddell called the lab to check on the results and they were rerunning them without is being called about the critical's.  Potassium is 7.1, glucose 817.  Associated pseudohyponatremia.  Creatinine is 1.73. [CF]  0042 Given the probability that treating with insulin  will drop the potassium I will not separately treat for hyperkalemia other than to also order a gram of calcium  gluconate IV.  She is receiving a second bolus of fluids and I ordered insulin  and all of the DKA protocol treatment.  Initially planned to consult the hospitalist team, but given the high risk  associated with hyperkalemia, preseptal cellulitis, pH of just barely greater than 7, severe DKA, etc., I am consulting the ICU.  Spoke with Almarie Nose with PCCM team and she will admit.  I reassessed the patient and she is hemodynamically stable, conversant, understands the situation, and does not need emergent intervention such as airway protection at this time.  No altered mental status. [CF]    Clinical Course User Index [CF] Gordan Huxley, MD [TT] [name], MD   .1-3 Lead EKG Interpretation  Performed by: Gordan Huxley, MD Authorized by: Gordan Huxley, MD     Interpretation: normal     ECG rate:  85   ECG rate assessment: normal     Rhythm: sinus rhythm     Ectopy: none     Conduction: normal   .Critical Care  Performed by: Gordan Huxley, MD Authorized by: Gordan Huxley, MD   Critical care provider statement:    Critical care time (minutes):  30   Critical care time was exclusive of:  Separately billable procedures and treating other patients   Critical care was necessary to treat or prevent imminent or life-threatening deterioration of the following conditions: DKA requiring IV insulin .   Critical care was time spent personally by me on the following activities:  Development of treatment plan with patient or surrogate, evaluation of patient's response to treatment, examination of patient, obtaining history from patient or surrogate, ordering and performing treatments and interventions, ordering and review of laboratory studies, ordering and review of radiographic  studies, pulse oximetry, re-evaluation of patient's condition and review of old charts    Medications  famotidine  (PEPCID ) IVPB 20 mg premix (20 mg Intravenous New Bag/Given 02/06/24 0046)  insulin  regular, human (MYXREDLIN ) 100 units/ 100 mL infusion (has no administration in time range)  lactated ringers  infusion (has no administration in time range)  dextrose  5 % in lactated ringers  infusion (has no  administration in time range)  dextrose  50 % solution 0-50 mL (has no administration in time range)  calcium  gluconate inj 10% (1 g) URGENT USE ONLY! (has no administration in time range)  lactated ringers  bolus 1,000 mL (0 mLs Intravenous Stopped 02/06/24 0023)  cefTRIAXone  (ROCEPHIN ) 1 g in sodium chloride  0.9 % 100 mL IVPB (0 g Intravenous Stopped 02/05/24 2250)  lactated ringers  bolus 1,000 mL (1,000 mLs Intravenous New Bag/Given 02/06/24 0046)     ED Discharge Orders     None      Final diagnoses:  Hyperglycemia  Dehydration  Preseptal cellulitis  Prolonged Q-T interval on ECG  Acidosis  Diabetic ketoacidosis without coma associated with type 2 diabetes mellitus (HCC)  Hyperkalemia     Gordan Huxley, MD 02/06/24 [phone]

## 2024-02-05 NOTE — ED Provider Notes (Signed)
 Michaela Johnson Provider Note    None    (approximate)   History   Hyperglycemia   HPI  Michaela Johnson is a 63 y.o. female is here paroxysmal A-fib on Xarelto , diabetes, depression, marijuana use, presenting with hyperglycemia.  Patient states that she ran out of her medications 3 to 4 days ago.  Has been having intermittent nausea, suprapubic cramping but no dysuria.  No chest pain or shortness of breath or fever.  Does say that she has some erythema around her right periorbital region, should be on antibiotics for it but she does not member what it is.  She denies any eye pain or pain with eye movement.  No vision changes.  Per independent history from EMS, when they got to her, blood glucose was read as high, upper limit for them was 600.  On independent review, she was admitted multiple times for DKA, last admission was in February of this year.     Physical Exam   Triage Vital Signs: ED Triage Vitals  Encounter Vitals Group     BP      Girls Systolic BP Percentile      Girls Diastolic BP Percentile      Boys Systolic BP Percentile      Boys Diastolic BP Percentile      Pulse      Resp      Temp      Temp src      SpO2      Weight      Height      Head Circumference      Peak Flow      Pain Score      Pain Loc      Pain Education      Exclude from Growth Chart     Most recent vital signs: Vitals:   02/05/24 2159 02/05/24 2230  BP: (!) 139/51 (!) 127/35  Pulse: (!) 112 85  Resp: 20 16  Temp: 97.6 F (36.4 C)   SpO2: 100% 100%     General: Awake, no distress.  CV:  Good peripheral perfusion.  Resp:  Normal effort.  No tachypnea or respiratory distress Abd:  No distention.  Soft nontender Other:  She does have right periorbital redness, pupils are equal reactive, extraocular movements are intact without pain with movement, she does not appear altered, moving all 4 extremities without any difficulty.   ED Results / Procedures /  Treatments   Labs (all labs ordered are listed, but only abnormal results are displayed) Labs Reviewed  BLOOD GAS, VENOUS - Abnormal; Notable for the following components:      Result Value   pH, Ven 7.07 (*)    pCO2, Ven 26 (*)    pO2, Ven 49 (*)    Bicarbonate 7.5 (*)    Acid-base deficit 21.3 (*)    All other components within normal limits  COMPREHENSIVE METABOLIC PANEL WITH GFR  CBC WITH DIFFERENTIAL/PLATELET  URINALYSIS, W/ REFLEX TO CULTURE (INFECTION SUSPECTED)  BETA-HYDROXYBUTYRIC ACID  TROPONIN I (HIGH SENSITIVITY)     EKG  EKG shows, EKG showed sinus rhythm, rate 97, widened QRS, prolonged QTc, intraventricular conduction delay, no obvious ST elevations, she does have ST depressions to inferior lateral leads, this is not significantly changed compared to prior   RADIOLOGY On my independent interpretation, chest x-ray without focal consolidation   PROCEDURES:  Critical Care performed: Yes, see critical care procedure note(s)  .Critical Care  Performed  by: [name], MD Authorized by: [name], MD   Critical care provider statement:    Critical care time (minutes):  40   Critical care was necessary to treat or prevent imminent or life-threatening deterioration of the following conditions:  Endocrine crisis   Critical care was time spent personally by me on the following activities:  Development of treatment plan with patient or surrogate, discussions with consultants, evaluation of patient's response to treatment, examination of patient, ordering and review of laboratory studies, ordering and review of radiographic studies, ordering and performing treatments and interventions, pulse oximetry, re-evaluation of patient's condition and review of old charts    MEDICATIONS ORDERED IN ED: Medications  lactated ringers  bolus 1,000 mL (1,000 mLs Intravenous New Bag/Given 02/05/24 2250)  cefTRIAXone  (ROCEPHIN ) 1 g in sodium chloride  0.9 % 100 mL IVPB (0 g  Intravenous Stopped 02/05/24 2250)     IMPRESSION / MDM / ASSESSMENT AND PLAN / ED COURSE  I reviewed the triage vital signs and the nursing notes.                              Differential diagnosis includes, but is not limited to, medication noncompliance, DKA, hyperglycemia, preseptal cellulitis, electrolyte derangements, dehydration.  Will get labs, EKG, UA, VBG, beta hydroxybutyrate.  Will start her on ceftriaxone  for preseptal cellulitis.  Patient's presentation is most consistent with acute presentation with potential threat to life or bodily function.  Independent interpretation of labs and imaging below.  Patient signed out pending rest of labs, likely need to be admitted for possible DKA.  The patient is on the cardiac monitor to evaluate for evidence of arrhythmia and/or significant heart rate changes.   Clinical Course as of 02/05/24 2312  Fri Feb 05, 2024  2219 DG Chest 1 View No active cardiopulmonary disease.  [TT]  2231 See the critical from respiratory, patient's VBG: ph 7.07, bicarb is 7 [TT]    Clinical Course User Index [TT] Waymond, Lorelle Cummins, MD     FINAL CLINICAL IMPRESSION(S) / ED DIAGNOSES   Final diagnoses:  Hyperglycemia  Dehydration  Preseptal cellulitis  Prolonged Q-T interval on ECG  Acidosis     Rx / DC Orders   ED Discharge Orders     None        Note:  This document was prepared using Dragon voice recognition software and may include unintentional dictation errors.    [name], MD 02/05/24 [phone]

## 2024-02-05 NOTE — ED Triage Notes (Signed)
 Pt BIB EMS for a CC of not feeling well. Patient was found to have a CBG of HIGH. Pt alert and oriented at triage.

## 2024-02-06 ENCOUNTER — Encounter: Payer: Self-pay | Admitting: Pulmonary Disease

## 2024-02-06 ENCOUNTER — Other Ambulatory Visit: Payer: Self-pay

## 2024-02-06 DIAGNOSIS — J449 Chronic obstructive pulmonary disease, unspecified: Secondary | ICD-10-CM | POA: Diagnosis present

## 2024-02-06 DIAGNOSIS — R000 Tachycardia, unspecified: Secondary | ICD-10-CM | POA: Diagnosis not present

## 2024-02-06 DIAGNOSIS — Z833 Family history of diabetes mellitus: Secondary | ICD-10-CM | POA: Diagnosis not present

## 2024-02-06 DIAGNOSIS — F1020 Alcohol dependence, uncomplicated: Secondary | ICD-10-CM | POA: Diagnosis present

## 2024-02-06 DIAGNOSIS — Z981 Arthrodesis status: Secondary | ICD-10-CM | POA: Diagnosis not present

## 2024-02-06 DIAGNOSIS — E1140 Type 2 diabetes mellitus with diabetic neuropathy, unspecified: Secondary | ICD-10-CM | POA: Diagnosis present

## 2024-02-06 DIAGNOSIS — E1010 Type 1 diabetes mellitus with ketoacidosis without coma: Secondary | ICD-10-CM | POA: Diagnosis not present

## 2024-02-06 DIAGNOSIS — B009 Herpesviral infection, unspecified: Secondary | ICD-10-CM | POA: Diagnosis present

## 2024-02-06 DIAGNOSIS — Z8249 Family history of ischemic heart disease and other diseases of the circulatory system: Secondary | ICD-10-CM | POA: Diagnosis not present

## 2024-02-06 DIAGNOSIS — L03213 Periorbital cellulitis: Secondary | ICD-10-CM | POA: Diagnosis present

## 2024-02-06 DIAGNOSIS — E860 Dehydration: Secondary | ICD-10-CM | POA: Diagnosis present

## 2024-02-06 DIAGNOSIS — N179 Acute kidney failure, unspecified: Secondary | ICD-10-CM | POA: Diagnosis present

## 2024-02-06 DIAGNOSIS — Z91148 Patient's other noncompliance with medication regimen for other reason: Secondary | ICD-10-CM | POA: Diagnosis not present

## 2024-02-06 DIAGNOSIS — A419 Sepsis, unspecified organism: Secondary | ICD-10-CM | POA: Diagnosis present

## 2024-02-06 DIAGNOSIS — I480 Paroxysmal atrial fibrillation: Secondary | ICD-10-CM | POA: Diagnosis present

## 2024-02-06 DIAGNOSIS — F32A Depression, unspecified: Secondary | ICD-10-CM | POA: Diagnosis present

## 2024-02-06 DIAGNOSIS — F1410 Cocaine abuse, uncomplicated: Secondary | ICD-10-CM | POA: Diagnosis present

## 2024-02-06 DIAGNOSIS — F410 Panic disorder [episodic paroxysmal anxiety] without agoraphobia: Secondary | ICD-10-CM | POA: Diagnosis present

## 2024-02-06 DIAGNOSIS — I10 Essential (primary) hypertension: Secondary | ICD-10-CM | POA: Diagnosis present

## 2024-02-06 DIAGNOSIS — E1110 Type 2 diabetes mellitus with ketoacidosis without coma: Secondary | ICD-10-CM | POA: Diagnosis present

## 2024-02-06 DIAGNOSIS — Z794 Long term (current) use of insulin: Secondary | ICD-10-CM | POA: Diagnosis not present

## 2024-02-06 DIAGNOSIS — Z7901 Long term (current) use of anticoagulants: Secondary | ICD-10-CM | POA: Diagnosis not present

## 2024-02-06 DIAGNOSIS — F17210 Nicotine dependence, cigarettes, uncomplicated: Secondary | ICD-10-CM | POA: Diagnosis present

## 2024-02-06 DIAGNOSIS — Z7984 Long term (current) use of oral hypoglycemic drugs: Secondary | ICD-10-CM | POA: Diagnosis not present

## 2024-02-06 DIAGNOSIS — E875 Hyperkalemia: Secondary | ICD-10-CM | POA: Diagnosis present

## 2024-02-06 DIAGNOSIS — E872 Acidosis, unspecified: Secondary | ICD-10-CM | POA: Diagnosis present

## 2024-02-06 DIAGNOSIS — L732 Hidradenitis suppurativa: Secondary | ICD-10-CM | POA: Diagnosis present

## 2024-02-06 LAB — CBC
HCT: 34.9 % — ABNORMAL LOW (ref 36.0–46.0)
Hemoglobin: 11.8 g/dL — ABNORMAL LOW (ref 12.0–15.0)
MCH: 29.9 pg (ref 26.0–34.0)
MCHC: 33.8 g/dL (ref 30.0–36.0)
MCV: 88.4 fL (ref 80.0–100.0)
Platelets: 351 K/uL (ref 150–400)
RBC: 3.95 MIL/uL (ref 3.87–5.11)
RDW: 12.3 % (ref 11.5–15.5)
WBC: 18.5 K/uL — ABNORMAL HIGH (ref 4.0–10.5)
nRBC: 0 % (ref 0.0–0.2)

## 2024-02-06 LAB — GLUCOSE, CAPILLARY
Glucose-Capillary: >600 mg/dL (ref 70–99)
Glucose-Capillary: >600 mg/dL (ref 70–99)
Glucose-Capillary: >600 mg/dL (ref 70–99)
Glucose-Capillary: >600 mg/dL (ref 70–99)
Glucose-Capillary: 519 mg/dL (ref 70–99)
Glucose-Capillary: 523 mg/dL (ref 70–99)
Glucose-Capillary: 424 mg/dL — ABNORMAL HIGH (ref 70–99)
Glucose-Capillary: 511 mg/dL (ref 70–99)
Glucose-Capillary: 387 mg/dL — ABNORMAL HIGH (ref 70–99)
Glucose-Capillary: 418 mg/dL — ABNORMAL HIGH (ref 70–99)
Glucose-Capillary: 327 mg/dL — ABNORMAL HIGH (ref 70–99)
Glucose-Capillary: 196 mg/dL — ABNORMAL HIGH (ref 70–99)
Glucose-Capillary: 238 mg/dL — ABNORMAL HIGH (ref 70–99)
Glucose-Capillary: 145 mg/dL — ABNORMAL HIGH (ref 70–99)
Glucose-Capillary: 169 mg/dL — ABNORMAL HIGH (ref 70–99)
Glucose-Capillary: 152 mg/dL — ABNORMAL HIGH (ref 70–99)
Glucose-Capillary: 138 mg/dL — ABNORMAL HIGH (ref 70–99)
Glucose-Capillary: 144 mg/dL — ABNORMAL HIGH (ref 70–99)
Glucose-Capillary: 120 mg/dL — ABNORMAL HIGH (ref 70–99)
Glucose-Capillary: 116 mg/dL — ABNORMAL HIGH (ref 70–99)

## 2024-02-06 LAB — GASTROINTESTINAL PANEL BY PCR, STOOL (REPLACES STOOL CULTURE)

## 2024-02-06 LAB — BASIC METABOLIC PANEL WITH GFR
Anion gap: 26 — ABNORMAL HIGH (ref 5–15)
Anion gap: 19 — ABNORMAL HIGH (ref 5–15)
Anion gap: 8 (ref 5–15)
Anion gap: 10 (ref 5–15)
Anion gap: 9 (ref 5–15)
BUN: 71 mg/dL — ABNORMAL HIGH (ref 8–23)
BUN: 62 mg/dL — ABNORMAL HIGH (ref 8–23)
BUN: 55 mg/dL — ABNORMAL HIGH (ref 8–23)
BUN: 49 mg/dL — ABNORMAL HIGH (ref 8–23)
BUN: 47 mg/dL — ABNORMAL HIGH (ref 8–23)
CO2: 10 mmol/L — ABNORMAL LOW (ref 22–32)
CO2: 15 mmol/L — ABNORMAL LOW (ref 22–32)
CO2: 25 mmol/L (ref 22–32)
CO2: 22 mmol/L (ref 22–32)
CO2: 23 mmol/L (ref 22–32)
Calcium: 8.6 mg/dL — ABNORMAL LOW (ref 8.9–10.3)
Calcium: 8.3 mg/dL — ABNORMAL LOW (ref 8.9–10.3)
Calcium: 7.9 mg/dL — ABNORMAL LOW (ref 8.9–10.3)
Calcium: 7.8 mg/dL — ABNORMAL LOW (ref 8.9–10.3)
Calcium: 7.8 mg/dL — ABNORMAL LOW (ref 8.9–10.3)
Chloride: 89 mmol/L — ABNORMAL LOW (ref 98–111)
Chloride: 94 mmol/L — ABNORMAL LOW (ref 98–111)
Chloride: 99 mmol/L (ref 98–111)
Chloride: 101 mmol/L (ref 98–111)
Chloride: 102 mmol/L (ref 98–111)
Creatinine, Ser: 1.49 mg/dL — ABNORMAL HIGH (ref 0.44–1.00)
Creatinine, Ser: 1.37 mg/dL — ABNORMAL HIGH (ref 0.44–1.00)
Creatinine, Ser: 1.09 mg/dL — ABNORMAL HIGH (ref 0.44–1.00)
Creatinine, Ser: 1.03 mg/dL — ABNORMAL HIGH (ref 0.44–1.00)
Creatinine, Ser: 1.06 mg/dL — ABNORMAL HIGH (ref 0.44–1.00)
GFR, Estimated: 39 mL/min — ABNORMAL LOW (ref >60)
GFR, Estimated: 43 mL/min — ABNORMAL LOW (ref >60)
GFR, Estimated: 57 mL/min — ABNORMAL LOW (ref >60)
GFR, Estimated: >60 mL/min (ref >60)
GFR, Estimated: 59 mL/min — ABNORMAL LOW (ref >60)
Glucose, Bld: 631 mg/dL (ref 70–99)
Glucose, Bld: 374 mg/dL — ABNORMAL HIGH (ref 70–99)
Glucose, Bld: 198 mg/dL — ABNORMAL HIGH (ref 70–99)
Glucose, Bld: 137 mg/dL — ABNORMAL HIGH (ref 70–99)
Glucose, Bld: 111 mg/dL — ABNORMAL HIGH (ref 70–99)
Potassium: 4.5 mmol/L (ref 3.5–5.1)
Potassium: 4.2 mmol/L (ref 3.5–5.1)
Potassium: 3.7 mmol/L (ref 3.5–5.1)
Potassium: 3.8 mmol/L (ref 3.5–5.1)
Potassium: 4.1 mmol/L (ref 3.5–5.1)
Sodium: 125 mmol/L — ABNORMAL LOW (ref 135–145)
Sodium: 128 mmol/L — ABNORMAL LOW (ref 135–145)
Sodium: 132 mmol/L — ABNORMAL LOW (ref 135–145)
Sodium: 133 mmol/L — ABNORMAL LOW (ref 135–145)
Sodium: 134 mmol/L — ABNORMAL LOW (ref 135–145)

## 2024-02-06 LAB — COMPREHENSIVE METABOLIC PANEL WITH GFR
ALT: 12 U/L (ref 0–44)
AST: 18 U/L (ref 15–41)
Albumin: 3 g/dL — ABNORMAL LOW (ref 3.5–5.0)
Alkaline Phosphatase: 103 U/L (ref 38–126)
Anion gap: 31 — ABNORMAL HIGH (ref 5–15)
BUN: 68 mg/dL — ABNORMAL HIGH (ref 8–23)
CO2: 7 mmol/L — ABNORMAL LOW (ref 22–32)
Calcium: 8.4 mg/dL — ABNORMAL LOW (ref 8.9–10.3)
Chloride: 80 mmol/L — ABNORMAL LOW (ref 98–111)
Creatinine, Ser: 1.73 mg/dL — ABNORMAL HIGH (ref 0.44–1.00)
GFR, Estimated: 33 mL/min — ABNORMAL LOW (ref >60)
Glucose, Bld: 817 mg/dL (ref 70–99)
Potassium: 7.1 mmol/L (ref 3.5–5.1)
Sodium: 118 mmol/L — CL (ref 135–145)
Total Bilirubin: 2 mg/dL — ABNORMAL HIGH (ref 0.0–1.2)
Total Protein: 7.5 g/dL (ref 6.5–8.1)

## 2024-02-06 LAB — URINALYSIS, W/ REFLEX TO CULTURE (INFECTION SUSPECTED)
Bilirubin Urine: NEGATIVE
Glucose, UA: >=500 mg/dL — AB
Ketones, ur: 5 mg/dL — AB
Nitrite: NEGATIVE
Protein, ur: NEGATIVE mg/dL
Specific Gravity, Urine: 1.018 (ref 1.005–1.030)
pH: 5 (ref 5.0–8.0)

## 2024-02-06 LAB — URINE DRUG SCREEN, QUALITATIVE (ARMC ONLY)
Amphetamines, Ur Screen: NOT DETECTED
Barbiturates, Ur Screen: NOT DETECTED
Benzodiazepine, Ur Scrn: NOT DETECTED
Cannabinoid 50 Ng, Ur ~~LOC~~: NOT DETECTED
Cocaine Metabolite,Ur ~~LOC~~: POSITIVE — AB
MDMA (Ecstasy)Ur Screen: NOT DETECTED
Methadone Scn, Ur: NOT DETECTED
Opiate, Ur Screen: NOT DETECTED
Phencyclidine (PCP) Ur S: NOT DETECTED
Tricyclic, Ur Screen: NOT DETECTED

## 2024-02-06 LAB — LACTIC ACID, PLASMA
Lactic Acid, Venous: 2.2 mmol/L (ref 0.5–1.9)
Lactic Acid, Venous: 1.6 mmol/L (ref 0.5–1.9)

## 2024-02-06 LAB — TSH: TSH: 0.547 u[IU]/mL (ref 0.350–4.500)

## 2024-02-06 LAB — RPR: RPR Ser Ql: NONREACTIVE

## 2024-02-06 LAB — BETA-HYDROXYBUTYRIC ACID: Beta-Hydroxybutyric Acid: 0.69 mmol/L — ABNORMAL HIGH (ref 0.05–0.27)

## 2024-02-06 LAB — MAGNESIUM: Magnesium: 2.4 mg/dL (ref 1.7–2.4)

## 2024-02-06 LAB — PROCALCITONIN: Procalcitonin: 0.28 ng/mL

## 2024-02-06 LAB — MRSA NEXT GEN BY PCR, NASAL: MRSA by PCR Next Gen: DETECTED — AB

## 2024-02-06 LAB — C DIFFICILE QUICK SCREEN W PCR REFLEX
C Diff antigen: NEGATIVE
C Diff interpretation: NOT DETECTED
C Diff toxin: NEGATIVE

## 2024-02-06 LAB — HIV ANTIBODY (ROUTINE TESTING W REFLEX): HIV Screen 4th Generation wRfx: NONREACTIVE

## 2024-02-06 LAB — CBG MONITORING, ED: Glucose-Capillary: >600 mg/dL (ref 70–99)

## 2024-02-06 LAB — PHOSPHORUS: Phosphorus: 2.9 mg/dL (ref 2.5–4.6)

## 2024-02-06 LAB — T4, FREE: Free T4: 1.01 ng/dL (ref 0.61–1.12)

## 2024-02-06 MED ORDER — VANCOMYCIN HCL 1250 MG/250ML IV SOLN
1250.0000 mg | Freq: Once | INTRAVENOUS | Status: AC
  Administered 2024-02-06: 1250 mg via INTRAVENOUS
  Filled 2024-02-06 (×2): qty 250

## 2024-02-06 MED ORDER — SODIUM CHLORIDE 0.9 % IV SOLN: INTRAVENOUS | Status: DC

## 2024-02-06 MED ORDER — INSULIN REGULAR(HUMAN) IN NACL 100-0.9 UT/100ML-% IV SOLN
INTRAVENOUS | Status: DC
  Administered 2024-02-06: 7.5 [IU]/h via INTRAVENOUS
  Administered 2024-02-06: 16 [IU]/h via INTRAVENOUS
  Filled 2024-02-06 (×2): qty 100

## 2024-02-06 MED ORDER — VANCOMYCIN HCL IN DEXTROSE 1-5 GM/200ML-% IV SOLN: 1000.0000 mg | Freq: Once | INTRAVENOUS | Status: DC

## 2024-02-06 MED ORDER — DEXTROSE IN LACTATED RINGERS 5 % IV SOLN: INTRAVENOUS | Status: DC

## 2024-02-06 MED ORDER — DEXTROSE 50 % IV SOLN: 0.0000 mL | INTRAVENOUS | Status: DC | PRN

## 2024-02-06 MED ORDER — CALCIUM GLUCONATE 10 % IV SOLN
1.0000 g | Freq: Once | INTRAVENOUS | Status: AC
  Administered 2024-02-06: 1 g via INTRAVENOUS
  Filled 2024-02-06: qty 10

## 2024-02-06 MED ORDER — THIAMINE HCL 100 MG/ML IJ SOLN
100.0000 mg | Freq: Once | INTRAMUSCULAR | Status: AC
  Administered 2024-02-06: 100 mg via INTRAVENOUS
  Filled 2024-02-06: qty 2

## 2024-02-06 MED ORDER — LACTATED RINGERS IV BOLUS
500.0000 mL | Freq: Once | INTRAVENOUS | Status: AC
  Administered 2024-02-06: 500 mL via INTRAVENOUS

## 2024-02-06 MED ORDER — SODIUM CHLORIDE 0.9 % IV SOLN
1.0000 g | Freq: Once | INTRAVENOUS | Status: AC
  Administered 2024-02-06: 1 g via INTRAVENOUS
  Filled 2024-02-06: qty 10

## 2024-02-06 MED ORDER — LACTATED RINGERS IV SOLN: INTRAVENOUS | Status: DC

## 2024-02-06 MED ORDER — STERILE WATER FOR INJECTION IV SOLN
INTRAVENOUS | Status: DC
  Filled 2024-02-06: qty 150

## 2024-02-06 MED ORDER — SODIUM CHLORIDE 0.9 % IV SOLN: 2.0000 g | INTRAVENOUS | Status: DC

## 2024-02-06 MED ORDER — HEPARIN SODIUM (PORCINE) 5000 UNIT/ML IJ SOLN
5000.0000 [IU] | Freq: Three times a day (TID) | INTRAMUSCULAR | Status: DC
  Administered 2024-02-06 – 2024-02-08 (×9): 5000 [IU] via SUBCUTANEOUS
  Filled 2024-02-06 (×9): qty 1

## 2024-02-06 MED ORDER — INSULIN GLARGINE-YFGN 100 UNIT/ML ~~LOC~~ SOLN
30.0000 [IU] | Freq: Every day | SUBCUTANEOUS | Status: DC
  Administered 2024-02-06 – 2024-02-08 (×3): 30 [IU] via SUBCUTANEOUS
  Filled 2024-02-06 (×3): qty 0.3

## 2024-02-06 MED ORDER — THIAMINE MONONITRATE 100 MG PO TABS
100.0000 mg | ORAL_TABLET | Freq: Every day | ORAL | Status: DC
  Administered 2024-02-07 – 2024-02-08 (×2): 100 mg via ORAL
  Filled 2024-02-06 (×2): qty 1

## 2024-02-06 MED ORDER — FENTANYL CITRATE PF 50 MCG/ML IJ SOSY
25.0000 ug | PREFILLED_SYRINGE | INTRAMUSCULAR | Status: DC | PRN
  Administered 2024-02-06 – 2024-02-08 (×4): 25 ug via INTRAVENOUS
  Filled 2024-02-06 (×4): qty 1

## 2024-02-06 MED ORDER — INSULIN ASPART 100 UNIT/ML IJ SOLN
0.0000 [IU] | Freq: Every day | INTRAMUSCULAR | Status: DC
  Administered 2024-02-07: 3 [IU] via SUBCUTANEOUS
  Filled 2024-02-06: qty 1

## 2024-02-06 MED ORDER — ADULT MULTIVITAMIN W/MINERALS CH
1.0000 | ORAL_TABLET | Freq: Every day | ORAL | Status: DC
  Administered 2024-02-07 – 2024-02-08 (×2): 1 via ORAL
  Filled 2024-02-06 (×2): qty 1

## 2024-02-06 MED ORDER — INSULIN ASPART 100 UNIT/ML IV SOLN
10.0000 [IU] | Freq: Once | INTRAVENOUS | Status: AC
  Administered 2024-02-06: 10 [IU] via INTRAVENOUS
  Filled 2024-02-06 (×2): qty 0.1

## 2024-02-06 MED ORDER — FOLIC ACID 1 MG PO TABS
1.0000 mg | ORAL_TABLET | Freq: Every day | ORAL | Status: DC
  Administered 2024-02-07 – 2024-02-08 (×2): 1 mg via ORAL
  Filled 2024-02-06 (×2): qty 1

## 2024-02-06 MED ORDER — VANCOMYCIN VARIABLE DOSE PER UNSTABLE RENAL FUNCTION (PHARMACIST DOSING): Status: DC

## 2024-02-06 MED ORDER — CHLORHEXIDINE GLUCONATE CLOTH 2 % EX PADS
6.0000 | MEDICATED_PAD | Freq: Every day | CUTANEOUS | Status: DC
  Administered 2024-02-06 – 2024-02-07 (×3): 6 via TOPICAL

## 2024-02-06 MED ORDER — DEXTROSE-SODIUM CHLORIDE 5-0.45 % IV SOLN
INTRAVENOUS | Status: DC
  Administered 2024-02-06: 125 mL/h via INTRAVENOUS

## 2024-02-06 MED ORDER — INSULIN ASPART 100 UNIT/ML IJ SOLN
0.0000 [IU] | Freq: Three times a day (TID) | INTRAMUSCULAR | Status: DC
  Administered 2024-02-07: 8 [IU] via SUBCUTANEOUS
  Administered 2024-02-07: 5 [IU] via SUBCUTANEOUS
  Administered 2024-02-07: 8 [IU] via SUBCUTANEOUS
  Administered 2024-02-08: 11 [IU] via SUBCUTANEOUS
  Administered 2024-02-08: 2 [IU] via SUBCUTANEOUS
  Filled 2024-02-06: qty 1
  Filled 2024-02-06: qty 8
  Filled 2024-02-06 (×3): qty 1

## 2024-02-06 MED ORDER — POLYETHYLENE GLYCOL 3350 17 G PO PACK: 17.0000 g | PACK | Freq: Every day | ORAL | Status: DC | PRN

## 2024-02-06 MED ORDER — DOCUSATE SODIUM 100 MG PO CAPS: 100.0000 mg | ORAL_CAPSULE | Freq: Two times a day (BID) | ORAL | Status: DC | PRN

## 2024-02-06 MED ORDER — FAMOTIDINE IN NACL 20-0.9 MG/50ML-% IV SOLN
20.0000 mg | Freq: Once | INTRAVENOUS | Status: AC
  Administered 2024-02-06: 20 mg via INTRAVENOUS
  Filled 2024-02-06: qty 50

## 2024-02-06 MED ORDER — DOXYCYCLINE HYCLATE 100 MG PO TABS
100.0000 mg | ORAL_TABLET | Freq: Two times a day (BID) | ORAL | Status: DC
  Administered 2024-02-07 – 2024-02-08 (×3): 100 mg via ORAL
  Filled 2024-02-06 (×3): qty 1

## 2024-02-06 MED ORDER — DIAZEPAM 5 MG/ML IJ SOLN: 5.0000 mg | Freq: Four times a day (QID) | INTRAMUSCULAR | Status: DC | PRN

## 2024-02-06 MED ORDER — SODIUM CHLORIDE 0.9 % IV BOLUS
1000.0000 mL | INTRAVENOUS | Status: AC
  Administered 2024-02-06: 1000 mL via INTRAVENOUS

## 2024-02-06 NOTE — Progress Notes (Signed)
 Alert and oriented all day. Sleepy all day as well. Incontinent of stool all day until [facility] placed. Up to the [facility] to void. Complianed all day about NPO status but then when given ice and water  did not drink them. Refused to have valuables including wallet.

## 2024-02-06 NOTE — Progress Notes (Signed)
 Pharmacy Antibiotic Note  Michaela Johnson is a 63 y.o. female admitted on 02/05/2024 with cellulitis.  Pharmacy has been consulted for Vancomycin  dosing.  Pt is dehydrated, appears to be AKI.  Will dose by levels until renal function improves.   Plan: Vancomycin  1250 mg IV X 1 given on 7/19 @ 0332.  - will draw Vanc trough in ~ 24 hrs on 7/20 @ 0300. - goal trough 15 - 20 mcg/mL   Height: 5' 2 (157.5 cm) Weight: 50.3 kg (111 lb) IBW/kg (Calculated) : 50.1  Temp (24hrs), Avg:97.7 F (36.5 C), Min:97.6 F (36.4 C), Max:97.8 F (36.6 C)  Recent Labs  Lab 02/05/24 2223 02/05/24 2337  WBC 17.2*  --   CREATININE  --  1.73*    Estimated Creatinine Clearance: 26 mL/min (A) (by C-G formula based on SCr of 1.73 mg/dL (H)).    Allergies  Allergen Reactions   Glipizide     Other reaction(s): Other (See Comments)   Metformin Diarrhea   Codeine Itching   Levaquin  [Levofloxacin  In D5w] Itching   Penicillins     Antimicrobials this admission:   >>    >>   Dose adjustments this admission:   Microbiology results:  BCx:   UCx:    Sputum:    MRSA PCR:   Thank you for allowing pharmacy to be a part of this patient's care.  Rebekka Lobello D 02/06/2024 4:19 AM

## 2024-02-06 NOTE — H&P (Signed)
 NAME:  Michaela Johnson, MRN:  [id number], DOB:  [birth year], LOS: 0 ADMISSION DATE:  02/05/2024, CONSULTATION DATE:  02/06/24 REFERRING MD: Gordan Huxley, CHIEF COMPLAINT: Hyperglycemia    HPI  63 y.o female with significant PMH of substance abuse disorder, anxiety and depression, EtOH and tobacco abuse, PAF on Xarelto , HTN, GAD, uncontrolled T2DM with recurrent DKA's who presented to the ED with chief complaints of hyperglycemia and feeling poorly.  recently hospitalized from hospital 1/25-/129/25 for MRSA bacteremia toe ulcer with MRSA and poorly controlled DM. she left against medical advice but we did give her antibiotics to treat MRSA linezolid  600mg  Po BID for 3 weeks. She was to have weekly labs and follow with ID in the clinic on 2/6 which she did not keep. Per chart review, she was admitted 08/30/23 for DKA and discharged the next day 2/11. She reports new onset lesions in bilateral armpits, groin area and right eye. Denies fever, chills or exposure to STD.   ED Course: Initial vital signs showed HR of 112 beats/minute, BP 139/51 mm Hg, the RR 20 breaths/minute, and the oxygen  saturation 100% on RA and a temperature of 97.39F (36.4C). Pertinent Labs/Diagnostics Findings: Na+ 118/K+ 7.1/chloride 80/CO2 <7/glucose 817/BUN 68/creatinine 1.73/AG 31 Beta-Hydroxy >8 WBC:17.2 K/L  VBG: pO2 49; pCO2 26; pH 7.07;  HCO3 7.5, %O2 Sat 72.4.  CXR> neg Medication administered in the ED:  Past Medical History  ETOH Abuse Anxiety Closed trimalleolar fracture of right ankle s/p repair  Constipation  Depression  Type II Diabetes Mellitus GERD  Neuropathy HTN Tobacco Use Paroxysmal Atrial Fibrillation (xarelto ) Panic Attacks Pneumonia  Vitamin D  Deficiency  Reduction and internal fixation of displaced right intertrochanteric hip fracture 01/04/22 Closed reduction of displaced right distal radius fracture 01/02/22  Significant Hospital Events   7/19: Admitted to ICU with DKA iso suspected  sepsis and cocaine use  Consults:  ID  Procedures:  None  Interim History / Subjective:      Micro Data:  7/19: Blood culture x2> 7/19: MRSA PCR>>   Antimicrobials:  Vancomycin  7/19> Ceftriaxone  7/18>>  OBJECTIVE  Blood pressure (!) 127/35, pulse 85, temperature 97.6 F (36.4 C), temperature source Oral, resp. rate 16, height 5' 2 (1.575 m), weight 50.3 kg, SpO2 100%.        Intake/Output Summary (Last 24 hours) at 02/06/2024 0141 Last data filed at 02/06/2024 0119 Gross per 24 hour  Intake 200 ml  Output --  Net 200 ml   Filed Weights   02/05/24 2158  Weight: 50.3 kg    Physical Examination  GENERAL: 63 year-old critically ill patient lying in the bed  EYES: PEERLA. No scleral icterus. Extraocular muscles intact.  HEENT: Head atraumatic, normocephalic. Oropharynx and nasopharynx clear.  CARDIOVASCULAR:S1, S2 normal. No murmurs, rubs, or gallops.  Intermittently tachycardic PULMONARY:normal lung sounds ABDOMINAL: Soft, NTND MUSCULOSKELETAL: No swelling. Normal range of motion. 5/5 strength in all extremities.   NEUROLOGICAL: General: No focal deficit present. She is alert and oriented to person, place, and time baseline.  PSYCHIATRIC:  Mood and Affect: Mood normal.  SKIN:Skin is warm and dry.  Capillary Refill:< 2sec. lesions noted below:           Labs/imaging that I havepersonally reviewed  (right click and Reselect all SmartList Selections daily)  DG Chest 1 View Result Date: 02/05/2024 CLINICAL DATA:  Hyperglycemia. EXAM: CHEST  1 VIEW COMPARISON:  August 15, 2023 FINDINGS: The heart size and mediastinal contours are within normal limits. Mild chronic appearing reticular changes  are noted. There is no evidence of acute infiltrate, pleural effusion or pneumothorax. A chronic deformity of the mid left clavicle is noted. Chronic fourth and fifth right rib deformities are seen. The visualized skeletal structures are otherwise unremarkable. IMPRESSION:  No active cardiopulmonary disease. Electronically Signed   By: Suzen Dials M.D.   On: 02/05/2024 22:12    Labs   CBC: Recent Labs  Lab 02/05/24 2223  WBC 17.2*  NEUTROABS 16.1*  HGB 13.5  HCT 43.9  MCV 97.3  PLT 367   Basic Metabolic Panel: Recent Labs  Lab 02/05/24 2337  NA 118*  K 7.1*  CL 80*  CO2 7*  GLUCOSE 817*  BUN 68*  CREATININE 1.73*  CALCIUM  8.4*   GFR: Estimated Creatinine Clearance: 26 mL/min (A) (by C-G formula based on SCr of 1.73 mg/dL (H)). Recent Labs  Lab 02/05/24 2223  WBC 17.2*    Liver Function Tests: Recent Labs  Lab 02/05/24 2337  AST 18  ALT 12  ALKPHOS 103  BILITOT 2.0*  PROT 7.5  ALBUMIN 3.0*   No results for input(s): LIPASE, AMYLASE in the last 168 hours. No results for input(s): AMMONIA in the last 168 hours.  ABG    Component Value Date/Time   PHART <6.95 (LL) 06/14/2022 1400   PCO2ART <18 (LL) 06/14/2022 1400   PO2ART 111 (H) 06/14/2022 1400   HCO3 7.5 (L) 02/05/2024 2223   ACIDBASEDEF 21.3 (H) 02/05/2024 2223   O2SAT 72.4 02/05/2024 2223     Coagulation Profile: No results for input(s): INR, PROTIME in the last 168 hours.  Cardiac Enzymes: No results for input(s): CKTOTAL, CKMB, CKMBINDEX, TROPONINI in the last 168 hours.  HbA1C: Hgb A1c MFr Bld  Date/Time Value Ref Range Status  08/16/2023 03:37 AM 9.8 (H) 4.8 - 5.6 % Final    Comment:    (NOTE) Pre diabetes:          5.7%-6.4%  Diabetes:              >6.4%  Glycemic control for   <7.0% adults with diabetes   01/30/2023 10:07 AM 12.0 (H) 4.8 - 5.6 % Final    Comment:    (NOTE)         Prediabetes: 5.7 - 6.4         Diabetes: >6.4         Glycemic control for adults with diabetes: <7.0     CBG: Recent Labs  Lab 02/06/24 0129  GLUCAP >600*    Review of Systems:   Review of Systems  Constitutional:  Positive for malaise/fatigue. Negative for chills, diaphoresis and fever.  HENT: Negative.    Eyes: Negative.    Respiratory: Negative.    Cardiovascular:  Positive for leg swelling.  Gastrointestinal:  Positive for abdominal pain.  Genitourinary:  Positive for dysuria.  Musculoskeletal: Negative.   Skin:  Positive for itching and rash.  Neurological: Negative.   Endo/Heme/Allergies: Negative.   Psychiatric/Behavioral:  Positive for depression and substance abuse. The patient is nervous/anxious.    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   Surgical History    Past Surgical  History:  Procedure Laterality Date   ANKLE FUSION Right 07/30/2020   Procedure: ANKLE FUSION;  Surgeon: Celena Sharper, MD;  Location: [facility] OR;  Service: Orthopedics;  Laterality: Right;   APPLICATION OF WOUND VAAPPLICATION OF WOUND VAC (Right Ankle)C (Right Ankle)  07/31/2020   APPLICATION OF WOUND VAC Right 07/30/2020   Procedure: APPLICATION OF WOUND VAC;  Surgeon: Celena Sharper, MD;  Location: [facility] OR;  Service: Orthopedics;  Laterality: Right;   HARDWARE REMOVAL Right 07/30/2020   Procedure: HARDWARE REMOVAL RIGHT ANKLE;  Surgeon: Celena Sharper, MD;  Location: [facility] OR;  Service: Orthopedics;  Laterality: Right;   HARDWARE REMOVAL RIGHT ANKLE (Right Ankle  07/31/2020   INCISION AND DRAINAGE ABSCESS N/A 06/15/2023   Procedure: INCISION AND DRAINAGE NASAL ABSCESS;  Surgeon: Herminio Miu, MD;  Location: [facility] ORS;  Service: ENT;  Laterality: N/A;   INTRAMEDULLARY (IM) NAIL INTERTROCHANTERIC  Right 01/04/2022   Procedure: INTRAMEDULLARY (IM) NAIL INTERTROCHANTRIC;  Surgeon: Edie Norleen PARAS, MD;  Location: [facility] ORS;  Service: Orthopedics;  Laterality: Right;   ORIF ANKLE FRACTURE Right 07/04/2020   Procedure: OPEN REDUCTION INTERNAL FIXATION (ORIF) ANKLE FRACTURE;  Surgeon: Leora Lynwood SAUNDERS, MD;  Location: [facility] ORS;  Service: Orthopedics;  Laterality: Right;   TEE WITHOUT CARDIOVERSION N/A 08/19/2023   Procedure: TRANSESOPHAGEAL ECHOCARDIOGRAM (TEE);  Surgeon: Gollan, Timothy J, MD;  Location: [facility] ORS;  Service: Cardiovascular;  Laterality: N/A;     Social History   reports that she has been smoking cigarettes, e-cigarettes, and cigars. She has a 41 pack-year smoking history. She has never used smokeless tobacco. She reports that she does not currently use alcohol  after a past usage of about 6.0 standard drinks of alcohol  per week. She reports that she does not currently use drugs after having used the following drugs: Marijuana. Frequency: 7.00 times per week.   Family History   Her family history includes CAD in her father; Diabetes in her mother and paternal aunt; Heart Problems in her father; Heart failure in her father.   Allergies Allergies  Allergen Reactions   Glipizide     Other reaction(s): Other (See Comments)   Metformin Diarrhea   Codeine Itching   Levaquin  [Levofloxacin  In D5w] Itching   Penicillins     Home Medications  Prior to Admission medications   Medication Sig Start Date End Date Taking? Authorizing Provider  carvedilol  (COREG ) 3.125 MG tablet Take 1 tablet (3.125 mg total) by mouth 2 (two) times daily with a meal. 08/31/23   Fausto Sor A, DO  Cholecalciferol  (VITAMIN D ) 125 MCG (5000 UT) CAPS Take 1 capsule by mouth daily. 08/03/20   Deward Eck, PA-C  FLUoxetine  (PROZAC ) 40 MG capsule Take 40 mg by mouth daily. 08/27/23   [provider]  gabapentin  (NEURONTIN ) 300 MG capsule Take 900 mg by mouth at bedtime. 02/28/20   [provider]   guaiFENesin  (MAX TUSSIN MUCUS & CHEST CONG) 100 MG/5ML liquid Take 10 mLs by mouth every 4 (four) hours as needed for cough or to loosen phlegm. 08/31/23   Fausto Sor A, DO  insulin  glargine (LANTUS ) 100 UNIT/ML injection Inject 40 Units into the skin daily.    [provider]  JARDIANCE  25 MG TABS tablet Take 25 mg by mouth daily. 05/27/23   [provider]  Multiple Vitamin (MULTIVITAMIN WITH MINERALS) TABS tablet Take 1 tablet by mouth daily. 01/07/22   Amin, Sumayya, MD  NATROBA 0.9 % SUSP Apply topically. 08/05/23   [provider]  nicotine  (NICODERM CQ  -  DOSED IN MG/24 HOURS) 21 mg/24hr patch Place 1 patch (21 mg total) onto the skin daily as needed (nicotine  craving). 06/17/23   Laurita Pillion, MD  NOVOLOG  FLEXPEN 100 UNIT/ML FlexPen Inject 5 Units into the skin 3 (three) times daily with meals. 09/01/23   Fausto Burnard LABOR, DO  omeprazole (PRILOSEC) 20 MG capsule Take 20 mg by mouth daily.    [provider]  rivaroxaban  (XARELTO ) 20 MG TABS tablet Take 1 tablet (20 mg total) by mouth daily with supper. 01/30/23   Gollan, Timothy J, MD  Scheduled Meds:  heparin   5,000 Units Subcutaneous Q8H   Continuous Infusions:  sodium chloride  125 mL/hr at 02/06/24 0315   cefTRIAXone  (ROCEPHIN )  IV     [START ON 02/07/2024] cefTRIAXone  (ROCEPHIN )  IV     dextrose  5 % and 0.45 % NaCl     dextrose  5% lactated ringers  Stopped (02/06/24 0119)   insulin  7.5 Units/hr (02/06/24 0300)   vancomycin      PRN Meds:.dextrose , docusate sodium , fentaNYL  (SUBLIMAZE ) injection, polyethylene glycol  Active Hospital Problem list   See systems below  Assessment & Plan:  #Uncontrolled T2DM #DKA- Known noncompliant with therapy now with possible sepsis and cocaine use -Cultures for infection pending -check A1c and lipase for pancreatitis -Received Insulin  (regular) 0.1u/kg (~10 units) IV x1. Continue Insulin  drip, DKA protocol -Keep NPO -q1h to titrate insulin  -q4h  BMP+Phosphorus+mag -Aggressive IVFs -Diabetes coordinator consult    #Acute Kidney Injury #AGMA with Lactic Acidosis #Hyponatremia- pseudohyponatremia in the setting of DKA correct for hyperglycemia #Hyperkalemia corrected with sodium bicarb, calcium  gluconate and Insulin  -Follow BMP -Ensure adequate renal perfusion -Avoid nephrotoxic  -Give 2 amps of sodium Bicarb and start sodium bicarb gtt -Replace lytes -strict I&O    #Sepsis~suspect skin and soft tissue due to multiple lesions in bilateral armpits ?suprativa hydranitis, groin area and right eye ?cellulitis 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, pt refused TEE in the past and did not follow up with ID in the clinic -F/u cultures, trend lactic/ PCT -Monitor WBC/ fever curve -Pressors for MAP goal >65 -start broad spectrum abx -ID consult   #Hx of ETOH abuse, #Anxiety and Depression, Panic Attacks, #Cocaine Abuse  #[employer] Abuse -Correct metabolic derangements -UDS/salicylate level/acetaminophen  level/ethanol level pending  -Folic acid , thiamine , and mvi  -CIWA protocol  -Once clinical status improves will need polysubstance abuse cessation counseling   #PAF - rate controlled on Metoprolol  -Hold Xarelto  for now    Best practice:  Diet:  NPO Pain/Anxiety/Delirium protocol (if indicated): No VAP protocol (if indicated): Not indicated DVT prophylaxis: Contraindicated GI prophylaxis: PPI Glucose control:  Insulin  gtt Central venous access:  N/A Arterial line:  N/A Foley:  N/A Mobility:  bed rest  PT consulted: N/A Last date of multidisciplinary goals of care discussion []  Code Status:  full code Disposition: ICU   = Goals of Care = Code Status Order: ICU  Primary Emergency Contact: May,Kalvin Wishes to pursue full aggressive treatment and intervention options, including CPR and intubation, but goals of care will be addressed on going with family if that should become necessary.  Critical care time:  45 minutes        Almarie Nose DNP, CCRN, FNP-C, AGACNP-BC Acute Care & Family Nurse Practitioner [facility] PCCM on call pager [phone]

## 2024-02-06 NOTE — ED Notes (Addendum)
 Report called to the ICU, Comer, RN

## 2024-02-06 NOTE — ED Notes (Signed)
 Pharmacy notified that the dose of 10units regular insulin  that was sent only contained 6units and was verified by second nurse, new dose requested to be sent to ICU

## 2024-02-06 NOTE — Inpatient Diabetes Management (Signed)
 Inpatient Diabetes Program Recommendations  AACE/ADA: New Consensus Statement on Inpatient Glycemic Control (2015)  Target Ranges:  Prepandial:   less than 140 mg/dL      Peak postprandial:   less than 180 mg/dL (1-2 hours)      Critically ill patients:  140 - 180 mg/dL   Lab Results  Component Value Date   GLUCAP 196 (H) 02/06/2024   HGBA1C 9.8 (H) 08/16/2023    Review of Glycemic Control  Latest Reference Range & Units 02/06/24 08:27 02/06/24 09:30 02/06/24 10:27  Glucose-Capillary 70 - 99 mg/dL 672 (H) 761 (H) 803 (H)   Diabetes history: DM2 Outpatient Diabetes medications:  Novolog  5 units tid with meals Lantus  40 units daily Jardiance  25 mg daily Current orders for Inpatient glycemic control:  IV insulin   Inpatient Diabetes Program Recommendations:    Note DKA. Agree with orders.  Consider rechecking beta-hydroxybutyric acid. Once acidosis cleared (for transition), consider Semglee  30 units 2 hours prior to d/c of insulin  drip.   Thanks,  Randall Bullocks, RN, BC-ADM Inpatient Diabetes Coordinator Pager [phone]  (8a-5p)

## 2024-02-06 NOTE — TOC Initial Note (Signed)
 Transition of Care [facility]) - Initial/Assessment Note    Patient Details  Name: Michaela Johnson MRN: [id number] Date of Birth: 09-Apr-1961  [facility] CM/SW Contact:    [place] L Ellamarie Naeve, LCSW Phone Number: 02/06/2024, 2:52 PM  Clinical Narrative:                  [facility] consult received for substance abuse education. Resources to address substance abuse was added to the AVS.   TOC will continue to monitor this patient to determine needs related to discharge planning.        Patient Goals and CMS Choice            Expected Discharge Plan and Services                                              Prior Living Arrangements/Services                       Activities of Daily Living   ADL Screening (condition at time of admission) Independently performs ADLs?: Yes (appropriate for developmental age) Is the patient deaf or have difficulty hearing?: Yes Does the patient have difficulty seeing, even when wearing glasses/contacts?: Yes Does the patient have difficulty concentrating, remembering, or making decisions?: Yes  Permission Sought/Granted                  Emotional Assessment              Admission diagnosis:  Acidosis [E87.20] Dehydration [E86.0] Hyperkalemia [E87.5] DKA (diabetic ketoacidosis) (HCC) [E11.10] Prolonged Q-T interval on ECG [R94.31] Hyperglycemia [R73.9] Preseptal cellulitis [L03.213] Diabetic ketoacidosis without coma associated with type 2 diabetes mellitus (HCC) [E11.10] Patient Active Problem List   Diagnosis Date Noted   DKA (diabetic ketoacidosis) (HCC) 02/06/2024   Influenza A 08/31/2023   Acute osteomyelitis of toe, right (HCC) 08/18/2023   MRSA bacteremia 08/16/2023   High anion gap metabolic acidosis 08/15/2023   Intractable nausea and vomiting 08/15/2023   Alopecia of scalp 08/15/2023   Cellulitis of scalp 08/15/2023   Cellulitis of right toe 08/15/2023   AKI (acute kidney injury) (HCC)  08/15/2023   Elevated troponin 08/15/2023   Abscess of nasal cavity 06/16/2023   Uncontrolled type 2 diabetes mellitus with hyperglycemia, with long-term current use of insulin  (HCC) 06/16/2023   Toe ulcer (HCC) 06/16/2023   Hyponatremia 06/16/2023   Hypokalemia 06/16/2023   Leukocytosis 88/74/7975   Head lice 06/15/2023   Metabolic acidosis 06/14/2022   Iatrogenic pneumothorax 06/14/2022   History of GI bleed 06/03/2022   Cirrhosis of liver (HCC) 06/03/2022   Cocaine use 06/03/2022   Diabetic ketoacidosis (HCC) 06/01/2022   Smoker 05/01/2022   Marijuana use 05/01/2022   Depression, major, single episode, complete remission (HCC) dx'd 15 years ago 05/01/2022   Acute postoperative anemia due to expected blood loss 01/05/2022   Closed right radial fracture 01/03/2022   Closed intertrochanteric fracture of hip, right, initial encounter (HCC) 01/02/2022   Acute kidney injury (HCC) 04/18/2021   Hyperkalemia 04/18/2021   Multiple lung nodules on CT 04/18/2021   Vitamin D  deficiency    Neuropathy    Chronic anticoagulation 07/31/2020   Chronic alcohol  use 07/31/2020   Nicotine  dependence 07/31/2020   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 [place]) [place] [address] Phone: [phone] Fax: [phone]  [facility] - [facility] [address] [place] [address] Phone: [phone] Fax: [phone]     Social Drivers of Health (SDOH) Social History: SDOH Screenings   Food Insecurity: Food Insecurity Present (02/06/2024)  Housing: High Risk (02/06/2024)   Transportation Needs: Unmet Transportation Needs (02/06/2024)  Utilities: At Risk (02/06/2024)  Tobacco Use: High Risk (08/30/2023)   SDOH Interventions:     Readmission Risk Interventions    09/01/2023    9:52 AM 08/18/2023   12:17 PM 06/16/2022   11:54 AM  Readmission Risk Prevention Plan  Transportation Screening Complete Complete Complete  PCP or Specialist Appt within 5-7 Days  Complete   Home Care Screening  Complete   Medication Review (RN CM)  Complete   HRI or Home Care Consult --    Social Work Consult for Recovery Care Planning/Counseling Complete    Palliative Care Screening Not Applicable    Medication Review Oceanographer) Complete  Complete  PCP or Specialist appointment within 3-5 days of discharge   Complete  HRI or Home Care Consult   Complete  SW Recovery Care/Counseling Consult   Not Complete  SW Consult Not Complete Comments   NA  Palliative Care Screening   Not Applicable  Skilled Nursing Facility   Not Applicable

## 2024-02-06 NOTE — Plan of Care (Signed)

## 2024-02-06 NOTE — Progress Notes (Signed)
 eLink Physician-Brief Progress Note Patient Name: Michaela Johnson DOB: [birth year] MRN: [id number]   Date of Service  02/06/2024  HPI/Events of Note  62/F with history of diabetes, presenting with nausea. Workup consistent with DKA.  She was started on fluids, insulin  and admitted to the ICU  eICU Interventions  DKA - Started on insulin  infusion. Will plan to transition to [facility] insulin  when anion gap closed, acidemia resolved, and glucose remains controlled.  - Continue fluids, start dextrose  containing fluids when glucose drops below 250mg /dL - Serial BMP.  -K currently elevated - will expect to come down as pt receives insulin  and as acidemia resolves.  - Na 118 - corrects to 135 when accounting for hyperglycemia.           Ky Rumple M DELA CRUZ 02/06/2024, 3:22 AM

## 2024-02-07 LAB — BASIC METABOLIC PANEL WITH GFR
Anion gap: 12 (ref 5–15)
BUN: 37 mg/dL — ABNORMAL HIGH (ref 8–23)
CO2: 19 mmol/L — ABNORMAL LOW (ref 22–32)
Calcium: 8 mg/dL — ABNORMAL LOW (ref 8.9–10.3)
Chloride: 98 mmol/L (ref 98–111)
Creatinine, Ser: 0.89 mg/dL (ref 0.44–1.00)
GFR, Estimated: >60 mL/min (ref >60)
Glucose, Bld: 271 mg/dL — ABNORMAL HIGH (ref 70–99)
Potassium: 4.3 mmol/L (ref 3.5–5.1)
Sodium: 129 mmol/L — ABNORMAL LOW (ref 135–145)

## 2024-02-07 LAB — GLUCOSE, CAPILLARY
Glucose-Capillary: 260 mg/dL — ABNORMAL HIGH (ref 70–99)
Glucose-Capillary: 298 mg/dL — ABNORMAL HIGH (ref 70–99)
Glucose-Capillary: 238 mg/dL — ABNORMAL HIGH (ref 70–99)
Glucose-Capillary: 185 mg/dL — ABNORMAL HIGH (ref 70–99)
Glucose-Capillary: 266 mg/dL — ABNORMAL HIGH (ref 70–99)

## 2024-02-07 LAB — MAGNESIUM: Magnesium: 2.4 mg/dL (ref 1.7–2.4)

## 2024-02-07 LAB — CBC
HCT: 34.2 % — ABNORMAL LOW (ref 36.0–46.0)
Hemoglobin: 11.9 g/dL — ABNORMAL LOW (ref 12.0–15.0)
MCH: 30.4 pg (ref 26.0–34.0)
MCHC: 34.8 g/dL (ref 30.0–36.0)
MCV: 87.5 fL (ref 80.0–100.0)
Platelets: 328 K/uL (ref 150–400)
RBC: 3.91 MIL/uL (ref 3.87–5.11)
RDW: 12.9 % (ref 11.5–15.5)
WBC: 18 K/uL — ABNORMAL HIGH (ref 4.0–10.5)
nRBC: 0 % (ref 0.0–0.2)

## 2024-02-07 LAB — HSV(HERPES SIMPLEX VRS) I + II AB-IGG
HSV 1 Glycoprotein G Ab, IgG: REACTIVE — AB
HSV 2 Glycoprotein G Ab, IgG: REACTIVE — AB

## 2024-02-07 MED ORDER — ACETAMINOPHEN 325 MG PO TABS
650.0000 mg | ORAL_TABLET | Freq: Four times a day (QID) | ORAL | Status: DC | PRN
  Administered 2024-02-07: 650 mg via ORAL
  Filled 2024-02-07: qty 2

## 2024-02-07 NOTE — Progress Notes (Signed)
 Charging cord

## 2024-02-07 NOTE — Progress Notes (Addendum)
 Much more alert than yesterday. Up in the room without help or supervision. Pulls self off monitor constantly because she wants to shut door, go to [facility] etc. Explained that she needs to call for help so as not to fall. Patient states she is fine and does not need help.  Replaced leads blood pressure cuff and pulse oximeter.1200 Up to [facility] after removing all of her monitors. Monitors replaced.1300 Patient removed monitors again. Lonell NP and Dr. Parris notified of patient constantly pulling off monitors. Continues to refuse to keep heart monitor on. States she feels sooooo much better.Watching TV and dosing.1814 Report called to [facility] RN on 2C. Efore patient left ICU she accused NT of stealing her $ 20.00. NT denied stealing anything from the patient. 7/19 patient refused to send wallet to Security to be locked up. Patient ask repeated but denied permission to store valuables with security. This nurse did not see the $20.00 yesterday or today that patient claims was stolen.

## 2024-02-07 NOTE — Progress Notes (Signed)
 NAME:  Michaela Johnson, MRN:  [id number], DOB:  Oct 10, 1960, LOS: 1 ADMISSION DATE:  02/05/2024, CONSULTATION DATE:  02/06/24 REFERRING MD: Gordan Huxley, CHIEF COMPLAINT: Hyperglycemia    HPI  63 y.o female with significant PMH of substance abuse disorder, anxiety and depression, EtOH and tobacco abuse, PAF on Xarelto , HTN, GAD, uncontrolled T2DM with recurrent DKA's who presented to the ED with chief complaints of hyperglycemia and feeling poorly.  recently hospitalized from hospital 1/25-/129/25 for MRSA bacteremia toe ulcer with MRSA and poorly controlled DM. she left against medical advice but we did give her antibiotics to treat MRSA linezolid  600mg  Po BID for 3 weeks. She was to have weekly labs and follow with ID in the clinic on 2/6 which she did not keep. Per chart review, she was admitted 08/30/23 for DKA and discharged the next day 2/11. She reports new onset lesions in bilateral armpits, groin area and right eye. Denies fever, chills or exposure to STD.   02/07/24- patient improved clinically, she's being optimized medically for TRH tnasfer.   Past Medical History  ETOH Abuse Anxiety Closed trimalleolar fracture of right ankle s/p repair  Constipation  Depression  Type II Diabetes Mellitus GERD  Neuropathy HTN Tobacco Use Paroxysmal Atrial Fibrillation (xarelto ) Panic Attacks Pneumonia  Vitamin D  Deficiency  Reduction and internal fixation of displaced right intertrochanteric hip fracture 01/04/22 Closed reduction of displaced right distal radius fracture 01/02/22  Significant Hospital Events   7/19: Admitted to ICU with DKA iso suspected sepsis and cocaine use  Consults:  ID  Procedures:  None  Interim History / Subjective:      Micro Data:  7/19: Blood culture x2> 7/19: MRSA PCR>>   Antimicrobials:  Vancomycin  7/19> Ceftriaxone  7/18>>  OBJECTIVE  Blood pressure (!) 146/68, pulse 84, temperature 98.1 F (36.7 C), resp. rate (!) 22, height 5' 2 (1.575  m), weight 50 kg, SpO2 99%.        Intake/Output Summary (Last 24 hours) at 02/07/2024 1013 Last data filed at 02/07/2024 9178 Gross per 24 hour  Intake 1005.78 ml  Output 1410 ml  Net -404.22 ml   Filed Weights   02/05/24 2158 02/06/24 0500 02/07/24 0407  Weight: 50.3 kg 49.8 kg 50 kg    Physical Examination  GENERAL: 63 year-old critically ill patient lying in the bed  EYES: PEERLA. No scleral icterus. Extraocular muscles intact.  HEENT: Head atraumatic, normocephalic. Oropharynx and nasopharynx clear.  CARDIOVASCULAR:S1, S2 normal. No murmurs, rubs, or gallops.  Intermittently tachycardic PULMONARY:normal lung sounds ABDOMINAL: Soft, NTND MUSCULOSKELETAL: No swelling. Normal range of motion. 5/5 strength in all extremities.   NEUROLOGICAL: General: No focal deficit present. She is alert and oriented to person, place, and time baseline.  PSYCHIATRIC:  Mood and Affect: Mood normal.  SKIN:Skin is warm and dry.  Capillary Refill:< 2sec. lesions noted below:           Labs/imaging that I havepersonally reviewed  (right click and Reselect all SmartList Selections daily)  DG Chest 1 View Result Date: 02/05/2024 CLINICAL DATA:  Hyperglycemia. EXAM: CHEST  1 VIEW COMPARISON:  August 15, 2023 FINDINGS: The heart size and mediastinal contours are within normal limits. Mild chronic appearing reticular changes are noted. There is no evidence of acute infiltrate, pleural effusion or pneumothorax. A chronic deformity of the mid left clavicle is noted. Chronic fourth and fifth right rib deformities are seen. The visualized skeletal structures are otherwise unremarkable. IMPRESSION: No active cardiopulmonary disease. Electronically Signed   By: Suzen  Houston M.D.   On: 02/05/2024 22:12    Labs   CBC: Recent Labs  Lab 02/05/24 2223 02/06/24 0722 02/07/24 0407  WBC 17.2* 18.5* 18.0*  NEUTROABS 16.1*  --   --   HGB 13.5 11.8* 11.9*  HCT 43.9 34.9* 34.2*  MCV 97.3 88.4 87.5   PLT 367 351 328   Basic Metabolic Panel: Recent Labs  Lab 02/06/24 0423 02/06/24 0722 02/06/24 1115 02/06/24 1541 02/06/24 1925 02/07/24 0406 02/07/24 0407  NA 125* 128* 132* 133* 134* 129*  --   K 4.5 4.2 3.7 3.8 4.1 4.3  --   CL 89* 94* 99 101 102 98  --   CO2 10* 15* 25 22 23  19*  --   GLUCOSE 631* 374* 198* 137* 111* 271*  --   BUN 71* 62* 55* 49* 47* 37*  --   CREATININE 1.49* 1.37* 1.09* 1.03* 1.06* 0.89  --   CALCIUM  8.6* 8.3* 7.9* 7.8* 7.8* 8.0*  --   MG 2.4  --   --   --   --   --  2.4  PHOS  --   --  2.9  --   --   --   --    GFR: Estimated Creatinine Clearance: 50.4 mL/min (by C-G formula based on SCr of 0.89 mg/dL). Recent Labs  Lab 02/05/24 2223 02/06/24 0423 02/06/24 0722 02/07/24 0407  PROCALCITON  --  0.28  --   --   WBC 17.2*  --  18.5* 18.0*  LATICACIDVEN  --  2.2* 1.6  --     Liver Function Tests: Recent Labs  Lab 02/05/24 2337  AST 18  ALT 12  ALKPHOS 103  BILITOT 2.0*  PROT 7.5  ALBUMIN 3.0*   No results for input(s): LIPASE, AMYLASE in the last 168 hours. No results for input(s): AMMONIA in the last 168 hours.  ABG    Component Value Date/Time   PHART <6.95 (LL) 06/14/2022 1400   PCO2ART <18 (LL) 06/14/2022 1400   PO2ART 111 (H) 06/14/2022 1400   HCO3 7.5 (L) 02/05/2024 2223   ACIDBASEDEF 21.3 (H) 02/05/2024 2223   O2SAT 72.4 02/05/2024 2223     Coagulation Profile: No results for input(s): INR, PROTIME in the last 168 hours.  Cardiac Enzymes: No results for input(s): CKTOTAL, CKMB, CKMBINDEX, TROPONINI in the last 168 hours.  HbA1C: Hgb A1c MFr Bld  Date/Time Value Ref Range Status  08/16/2023 03:37 AM 9.8 (H) 4.8 - 5.6 % Final    Comment:    (NOTE) Pre diabetes:          5.7%-6.4%  Diabetes:              >6.4%  Glycemic control for   <7.0% adults with diabetes   01/30/2023 10:07 AM 12.0 (H) 4.8 - 5.6 % Final    Comment:    (NOTE)         Prediabetes: 5.7 - 6.4         Diabetes: >6.4          Glycemic control for adults with diabetes: <7.0     CBG: Recent Labs  Lab 02/06/24 1432 02/06/24 1525 02/06/24 1656 02/06/24 2105 02/07/24 0739  GLUCAP 144* 138* 120* 116* 260*    Review of Systems:    10 point ROS done and is negative except as per subjective findings  Past Medical History  She,  has a past medical history of Alcohol  use, Anxiety, Arrhythmia, Arthritis, Chronic alcohol  use (07/31/2020),  Chronic anticoagulation (07/31/2020), 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   Surgical History    Past Surgical History:  Procedure Laterality Date   ANKLE FUSION Right 07/30/2020   Procedure: ANKLE FUSION;  Surgeon: Celena Sharper, MD;  Location: [facility] OR;  Service: Orthopedics;  Laterality: Right;   APPLICATION OF WOUND VAAPPLICATION OF WOUND VAC (Right Ankle)C (Right Ankle)  07/31/2020   APPLICATION OF WOUND VAC Right 07/30/2020   Procedure: APPLICATION OF WOUND VAC;  Surgeon: Celena Sharper, MD;  Location: [facility] OR;  Service: Orthopedics;  Laterality: Right;   HARDWARE REMOVAL Right 07/30/2020   Procedure: HARDWARE REMOVAL RIGHT ANKLE;  Surgeon: Celena Sharper, MD;  Location: [facility] OR;  Service: Orthopedics;  Laterality: Right;   HARDWARE REMOVAL RIGHT ANKLE (Right Ankle  07/31/2020   INCISION AND DRAINAGE ABSCESS N/A 06/15/2023   Procedure: INCISION AND DRAINAGE NASAL ABSCESS;  Surgeon: Herminio Miu, MD;  Location: [facility] ORS;  Service: ENT;  Laterality: N/A;   INTRAMEDULLARY (IM) NAIL INTERTROCHANTERIC Right 01/04/2022   Procedure: INTRAMEDULLARY (IM) NAIL INTERTROCHANTRIC;  Surgeon: Edie Norleen PARAS,  MD;  Location: [facility] ORS;  Service: Orthopedics;  Laterality: Right;   ORIF ANKLE FRACTURE Right 07/04/2020   Procedure: OPEN REDUCTION INTERNAL FIXATION (ORIF) ANKLE FRACTURE;  Surgeon: Leora Lynwood SAUNDERS, MD;  Location: [facility] ORS;  Service: Orthopedics;  Laterality: Right;   TEE WITHOUT CARDIOVERSION N/A 08/19/2023   Procedure: TRANSESOPHAGEAL ECHOCARDIOGRAM (TEE);  Surgeon: Gollan, Timothy J, MD;  Location: [facility] ORS;  Service: Cardiovascular;  Laterality: N/A;     Social History   reports that she has been smoking cigarettes, e-cigarettes, and cigars. She has a 41 pack-year smoking history. She has never used smokeless tobacco. She reports that she does not currently use alcohol  after a past usage of about 6.0 standard drinks of alcohol  per week. She reports current drug use. Frequency: 7.00 times per week. Drugs: Marijuana and Cocaine.   Family History   Her family history includes CAD in her father; Diabetes in her mother and paternal aunt; Heart Problems in her father; Heart failure in her father.   Allergies Allergies  Allergen Reactions   Glipizide     Other reaction(s): Other (See Comments)   Metformin Diarrhea   Levaquin  [Levofloxacin  In D5w] Itching   Penicillins     Home Medications  Prior to Admission medications   Medication Sig Start Date End Date Taking? Authorizing Provider  carvedilol  (COREG ) 3.125 MG tablet Take 1 tablet (3.125 mg total) by mouth 2 (two) times daily with a meal. 08/31/23   Fausto Sor A, DO  Cholecalciferol  (VITAMIN D ) 125 MCG (5000 UT) CAPS Take 1 capsule by mouth daily. 08/03/20   Deward Eck, PA-C  FLUoxetine  (PROZAC ) 40 MG capsule Take 40 mg by mouth daily. 08/27/23   [provider]  gabapentin  (NEURONTIN ) 300 MG capsule Take 900 mg by mouth at bedtime. 02/28/20   [provider]  guaiFENesin  (MAX TUSSIN MUCUS & CHEST CONG) 100 MG/5ML liquid Take 10 mLs by mouth every 4 (four) hours as needed for cough or to loosen phlegm. 08/31/23    Fausto Sor A, DO  insulin  glargine (LANTUS ) 100 UNIT/ML injection Inject 40 Units into the skin daily.    [provider]  JARDIANCE  25 MG TABS tablet  Take 25 mg by mouth daily. 05/27/23   [provider]  Multiple Vitamin (MULTIVITAMIN WITH MINERALS) TABS tablet Take 1 tablet by mouth daily. 01/07/22   Amin, Sumayya, MD  NATROBA 0.9 % SUSP Apply topically. 08/05/23   [provider]  nicotine  (NICODERM CQ  - DOSED IN MG/24 HOURS) 21 mg/24hr patch Place 1 patch (21 mg total) onto the skin daily as needed (nicotine  craving). 06/17/23   Laurita Pillion, MD  NOVOLOG  FLEXPEN 100 UNIT/ML FlexPen Inject 5 Units into the skin 3 (three) times daily with meals. 09/01/23   Fausto Burnard LABOR, DO  omeprazole (PRILOSEC) 20 MG capsule Take 20 mg by mouth daily.    [provider]  rivaroxaban  (XARELTO ) 20 MG TABS tablet Take 1 tablet (20 mg total) by mouth daily with supper. 01/30/23   Gollan, Timothy J, MD  Scheduled Meds:  Chlorhexidine  Gluconate Cloth  6 each Topical Daily   doxycycline   100 mg Oral Q12H   folic acid   1 mg Oral Daily   heparin   5,000 Units Subcutaneous Q8H   insulin  aspart  0-15 Units Subcutaneous TID WC   insulin  aspart  0-5 Units Subcutaneous QHS   insulin  glargine-yfgn  30 Units Subcutaneous Daily   multivitamin with minerals  1 tablet Oral Daily   thiamine   100 mg Oral Daily   Continuous Infusions:   PRN Meds:.acetaminophen , dextrose , diazepam , docusate sodium , fentaNYL  (SUBLIMAZE ) injection, polyethylene glycol  Active Hospital Problem list   See systems below  Assessment & Plan:  #Uncontrolled T2DM #DKA- Known noncompliant with therapy now with possible sepsis and cocaine use -Cultures for infection pending -check A1c and lipase for pancreatitis -Received Insulin  (regular) 0.1u/kg (~10 units) IV x1. Continue Insulin  drip, DKA protocol -Keep NPO -q1h to titrate insulin  -q4h BMP+Phosphorus+mag -Aggressive IVFs -Diabetes coordinator  consult    #Acute Kidney Injury #AGMA with Lactic Acidosis #Hyponatremia- pseudohyponatremia in the setting of DKA correct for hyperglycemia #Hyperkalemia corrected with sodium bicarb, calcium  gluconate and Insulin  -Follow BMP -Ensure adequate renal perfusion -Avoid nephrotoxic  -Give 2 amps of sodium Bicarb and start sodium bicarb gtt -Replace lytes -strict I&O    #Sepsis~suspect skin and soft tissue due to multiple lesions in bilateral armpits ?suprativa hydranitis, groin area and right eye ?cellulitis 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, pt refused TEE in the past and did not follow up with ID in the clinic -F/u cultures, trend lactic/ PCT -Monitor WBC/ fever curve -Pressors for MAP goal >65 -start broad spectrum abx -ID consult   #Hx of ETOH abuse, #Anxiety and Depression, Panic Attacks, #Cocaine Abuse  #[employer] Abuse -Correct metabolic derangements -UDS/salicylate level/acetaminophen  level/ethanol level pending  -Folic acid , thiamine , and mvi  -CIWA protocol  -Once clinical status improves will need polysubstance abuse cessation counseling   #PAF - rate controlled on Metoprolol  -Hold Xarelto  for now    Best practice:  Diet:  NPO Pain/Anxiety/Delirium protocol (if indicated): No VAP protocol (if indicated): Not indicated DVT prophylaxis: Contraindicated GI prophylaxis: PPI Glucose control:  Insulin  gtt Central venous access:  N/A Arterial line:  N/A Foley:  N/A Mobility:  bed rest  PT consulted: N/A Last date of multidisciplinary goals of care discussion []  Code Status:  full code Disposition: ICU   = Goals of Care = Code Status Order: ICU  Primary Emergency Contact: May,Kalvin Wishes to pursue full aggressive treatment and intervention options, including CPR and intubation, but goals of care will be addressed on going with family if that should become necessary.  Critical  care provider statement:   Total critical care time: 33 minutes    Performed by: Parris MD   Critical care time was exclusive of separately billable procedures and treating other patients.   Critical care was necessary to treat or prevent imminent or life-threatening deterioration.   Critical care was time spent personally by me on the following activities: development of treatment plan with patient and/or surrogate as well as nursing, discussions with consultants, evaluation of patient's response to treatment, examination of patient, obtaining history from patient or surrogate, ordering and performing treatments and interventions, ordering and review of laboratory studies, ordering and review of radiographic studies, pulse oximetry and re-evaluation of patient's condition.    Laquashia Mergenthaler, M.D.  Pulmonary & Critical Care Medicine

## 2024-02-08 ENCOUNTER — Other Ambulatory Visit: Payer: Self-pay

## 2024-02-08 DIAGNOSIS — E1010 Type 1 diabetes mellitus with ketoacidosis without coma: Secondary | ICD-10-CM

## 2024-02-08 DIAGNOSIS — L732 Hidradenitis suppurativa: Secondary | ICD-10-CM | POA: Diagnosis present

## 2024-02-08 LAB — CBC
HCT: 33.1 % — ABNORMAL LOW (ref 36.0–46.0)
Hemoglobin: 11.1 g/dL — ABNORMAL LOW (ref 12.0–15.0)
MCH: 29.7 pg (ref 26.0–34.0)
MCHC: 33.5 g/dL (ref 30.0–36.0)
MCV: 88.5 fL (ref 80.0–100.0)
Platelets: 275 K/uL (ref 150–400)
RBC: 3.74 MIL/uL — ABNORMAL LOW (ref 3.87–5.11)
RDW: 13 % (ref 11.5–15.5)
WBC: 7.2 K/uL (ref 4.0–10.5)
nRBC: 0 % (ref 0.0–0.2)

## 2024-02-08 LAB — BASIC METABOLIC PANEL WITH GFR
Anion gap: 7 (ref 5–15)
BUN: 21 mg/dL (ref 8–23)
CO2: 26 mmol/L (ref 22–32)
Calcium: 8.4 mg/dL — ABNORMAL LOW (ref 8.9–10.3)
Chloride: 94 mmol/L — ABNORMAL LOW (ref 98–111)
Creatinine, Ser: 0.59 mg/dL (ref 0.44–1.00)
GFR, Estimated: >60 mL/min (ref >60)
Glucose, Bld: 360 mg/dL — ABNORMAL HIGH (ref 70–99)
Potassium: 4.6 mmol/L (ref 3.5–5.1)
Sodium: 127 mmol/L — ABNORMAL LOW (ref 135–145)

## 2024-02-08 LAB — GLUCOSE, CAPILLARY
Glucose-Capillary: 317 mg/dL — ABNORMAL HIGH (ref 70–99)
Glucose-Capillary: 150 mg/dL — ABNORMAL HIGH (ref 70–99)

## 2024-02-08 LAB — URINE CULTURE: Culture: >=100000 — AB

## 2024-02-08 LAB — MAGNESIUM: Magnesium: 2.2 mg/dL (ref 1.7–2.4)

## 2024-02-08 LAB — PHOSPHORUS: Phosphorus: 1.7 mg/dL — ABNORMAL LOW (ref 2.5–4.6)

## 2024-02-08 MED ORDER — RIVAROXABAN 20 MG PO TABS
20.0000 mg | ORAL_TABLET | Freq: Every day | ORAL | 0 refills | Status: DC
  Filled 2024-02-08: qty 30, 30d supply, fill #0

## 2024-02-08 MED ORDER — CARVEDILOL 3.125 MG PO TABS
3.1250 mg | ORAL_TABLET | Freq: Two times a day (BID) | ORAL | 0 refills | Status: AC
  Filled 2024-02-08: qty 60, 30d supply, fill #0

## 2024-02-08 MED ORDER — CHLORHEXIDINE GLUCONATE CLOTH 2 % EX PADS: 6.0000 | MEDICATED_PAD | Freq: Every day | CUTANEOUS | Status: DC

## 2024-02-08 MED ORDER — INSULIN GLARGINE 100 UNIT/ML SOLOSTAR PEN
40.0000 [IU] | PEN_INJECTOR | Freq: Every day | SUBCUTANEOUS | 11 refills | Status: AC
  Filled 2024-02-08: qty 15, 37d supply, fill #0

## 2024-02-08 MED ORDER — NOVOLOG FLEXPEN 100 UNIT/ML ~~LOC~~ SOPN
5.0000 [IU] | PEN_INJECTOR | Freq: Three times a day (TID) | SUBCUTANEOUS | 1 refills | Status: AC
  Filled 2024-02-08: qty 15, 90d supply, fill #0

## 2024-02-08 MED ORDER — MUPIROCIN 2 % EX OINT
1.0000 | TOPICAL_OINTMENT | Freq: Two times a day (BID) | CUTANEOUS | 0 refills | Status: AC
  Filled 2024-02-08: qty 22, 11d supply, fill #0

## 2024-02-08 MED ORDER — JARDIANCE 25 MG PO TABS
25.0000 mg | ORAL_TABLET | Freq: Every day | ORAL | 0 refills | Status: AC
  Filled 2024-02-08: qty 30, 30d supply, fill #0

## 2024-02-08 MED ORDER — MUPIROCIN 2 % EX OINT
1.0000 | TOPICAL_OINTMENT | Freq: Two times a day (BID) | CUTANEOUS | Status: DC
  Filled 2024-02-08: qty 22

## 2024-02-08 MED ORDER — INSULIN PEN NEEDLE 32G X 4 MM MISC
1.0000 | 0 refills | Status: AC
  Filled 2024-02-08: qty 100, fill #0

## 2024-02-08 MED ORDER — DOXYCYCLINE HYCLATE 100 MG PO TABS
100.0000 mg | ORAL_TABLET | Freq: Two times a day (BID) | ORAL | 0 refills | Status: AC
  Filled 2024-02-08: qty 20, 10d supply, fill #0

## 2024-02-08 MED ORDER — FLUOXETINE HCL 40 MG PO CAPS
40.0000 mg | ORAL_CAPSULE | Freq: Every day | ORAL | 0 refills | Status: AC
  Filled 2024-02-08: qty 30, 30d supply, fill #0

## 2024-02-08 MED ORDER — INSULIN PEN NEEDLE 32G X 4 MM MISC
1.0000 | 0 refills | Status: DC
  Filled 2024-02-08: qty 100, 25d supply, fill #0

## 2024-02-08 NOTE — TOC Initial Note (Signed)
 Transition of Care [facility]) - Initial/Assessment Note    Patient Details  Name: Michaela Johnson MRN: [id number] Date of Birth: 1961-05-28  [facility] of [facility]) CM/SW Contact:    Corean ONEIDA Haddock, RN Phone Number: 02/08/2024, 2:28 PM  Clinical Narrative:                   Admitted for: DKA Admitted from: Patient states that she rents a closet for 2 roommates  PCP: Era  Current home health/prior home health/DME: [facility]  Per SDOH added food, transport, and utility resources.  Also added housing resources.   Patient states that she has a car, however it is broken and for any follow up appointments will have to use medicaid transport.    Patient to discharge today Discharge meds delivered Meds to bed Transport arrranged through [employer] reference number [id number].  Per patient she is staying at [address], [place]         Patient Goals and CMS Choice            Expected Discharge Plan and Services         Expected Discharge Date: 02/08/24                                    Prior Living Arrangements/Services                       Activities of Daily Living   ADL Screening (condition at time of admission) Independently performs ADLs?: Yes (appropriate for developmental age) Is the patient deaf or have difficulty hearing?: Yes Does the patient have difficulty seeing, even when wearing glasses/contacts?: Yes Does the patient have difficulty concentrating, remembering, or making decisions?: Yes  Permission Sought/Granted                  Emotional Assessment              Admission diagnosis:  Acidosis [E87.20] Dehydration [E86.0] Hyperkalemia [E87.5] DKA (diabetic ketoacidosis) (HCC) [E11.10] Prolonged Q-T interval on ECG [R94.31] Hyperglycemia [R73.9] Preseptal cellulitis [L03.213] Diabetic ketoacidosis without coma associated with type 2 diabetes mellitus (HCC) [E11.10] Patient Active Problem List    Diagnosis Date Noted   Hidradenitis suppurativa of left axilla 02/08/2024   DKA (diabetic ketoacidosis) (HCC) 02/06/2024   Influenza A 08/31/2023   Acute osteomyelitis of toe, right (HCC) 08/18/2023   MRSA bacteremia 08/16/2023   High anion gap metabolic acidosis 08/15/2023   Intractable nausea and vomiting 08/15/2023   Alopecia of scalp 08/15/2023   Cellulitis of scalp 08/15/2023   Cellulitis of right toe 08/15/2023   AKI (acute kidney injury) (HCC) 08/15/2023   Elevated troponin 08/15/2023   Abscess of nasal cavity 06/16/2023   Uncontrolled type 2 diabetes mellitus with hyperglycemia, with long-term current use of insulin  (HCC) 06/16/2023   Toe ulcer (HCC) 06/16/2023   Hyponatremia 06/16/2023   Hypokalemia 06/16/2023   Leukocytosis 88/74/7975   Head lice 06/15/2023   Metabolic acidosis 06/14/2022   Iatrogenic pneumothorax 06/14/2022   History of GI bleed 06/03/2022   Cirrhosis of liver (HCC) 06/03/2022   Cocaine use 06/03/2022   Diabetic ketoacidosis (HCC) 06/01/2022   Smoker 05/01/2022   Marijuana use 05/01/2022   Depression, major, single episode, complete remission (HCC) dx'd 15 years ago 05/01/2022   Acute postoperative anemia due to expected blood loss 01/05/2022   Closed right radial fracture 01/03/2022  Closed intertrochanteric fracture of hip, right, initial encounter (HCC) 01/02/2022   Acute kidney injury (HCC) 04/18/2021   Hyperkalemia 04/18/2021   Multiple lung nodules on CT 04/18/2021   Vitamin D  deficiency    Neuropathy    Chronic anticoagulation 07/31/2020   Chronic alcohol  use 07/31/2020   Nicotine  dependence 07/31/2020   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 ([address]), [place] - [address] [place]) [place] [address] Phone: [phone] Fax: [phone]  [facility] [address] [place] [address] Phone: [phone] Fax: [phone]     Social Drivers of Health (SDOH) Social History: SDOH Screenings   Food Insecurity: Food Insecurity Present (02/06/2024)  Housing: High Risk (02/06/2024)  Transportation Needs: Unmet Transportation Needs (02/06/2024)  Utilities: At Risk (02/06/2024)  Tobacco Use: High Risk (02/06/2024)   SDOH Interventions: Food Insecurity Interventions: Inpatient TOC, Other (Comment) (Resources added to AVS) Transportation Interventions: Other (Comment), Inpatient TOC (Resources added to AVS) Utilities Interventions: Inpatient TOC, Other (Comment) (Resources added to AVS)   Readmission Risk Interventions    09/01/2023    9:52 AM 08/18/2023   12:17 PM 06/16/2022   11:54 AM  Readmission Risk Prevention Plan  Transportation Screening Complete Complete Complete  PCP or Specialist Appt within 5-7 Days  Complete   Home Care Screening  Complete   Medication Review (RN CM)  Complete   HRI or Home Care Consult --    Social Work Consult for Recovery Care Planning/Counseling Complete    Palliative Care Screening Not Applicable    Medication Review Ocean[employer]) Complete  Complete  PCP or Specialist appointment within 3-5 days of discharge   Complete  HRI or Home Care Consult   Complete  SW Recovery Care/Counseling Consult   Not Complete  SW Consult Not Complete Comments   NA  Palliative Care Screening   Not Applicable  Skilled Nursing Facility   Not Applicable

## 2024-02-08 NOTE — Discharge Summary (Addendum)
 Physician Discharge Summary   Patient: Michaela Johnson MRN: [id number] DOB: [birth year]  Admit date:     02/05/2024  Discharge date: 02/08/24  Discharge Physician: Delon Herald   PCP: Michaela Raisin, NP   Recommendations at discharge:   Diabetes medications have been sent to the pharmacy; it is essential that you take them as directed and don't run out Complete antibiotics (doxycycline  twice daily for 10 days) Follow up with NP Michaela in 1-2 weeks Follow up with surgery clinic; referral made Do not use cocaine, alcohol , or other drugs Stop smoking You are being referred for lung cancer screening and should be contacted with an appointment  Discharge Diagnoses: Principal Problem:   DKA (diabetic ketoacidosis) (HCC) Active Problems:   Uncontrolled type 2 diabetes mellitus with hyperglycemia, with long-term current use of insulin  (HCC)   PAF (paroxysmal atrial fibrillation) (HCC)   Hypertension associated with diabetes (HCC)   AKI (acute kidney injury) (HCC)   Smoker   H/O medication noncompliance   Substance abuse (HCC)   Hidradenitis suppurativa of left axilla  Hospital Course: 62yo with h/o substance use d/o, anxiety/depression, afib on Xarelto , HTN, and uncontrolled DM with recurrent DKA who presented on 7/18 with DKA.  She was also diagnosed with sepsis due to skin infection and started on antibiotics with improvement.  Assessment and Plan:  DKA with baseline uncontrolled T2DM Patient reports that she was unable to afford home DM medications and so has not been taking them DKA on presentation with pH 7.07, CO2 7 DKA resolved with Insulin  drip She has been transitioned back to insulin  Will need close outpatient f/u, says she can easily get an appointment at the [facility] Will prescribe medications to [facility] pharmacy to ensure that she has access in the interim (she is in agreement) Diabetes coordinator consulting Resume home glargine plus flex pen with meals Resume  Jardiance  (although this may need to be stopped permanently if she develops acidosis while actively taking the medication)    Acute Kidney Injury Treated with IVF and bicarb with resolution    Skin and soft tissue due to multiple lesions in bilateral armpits ?suprativa hydranitis, groin area and right eye ?cellulitis 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, pt refused TEE in the past and did not follow up with ID in the clinic Concern for sepsis on admission but this was ruled out MRSA PCR is positive Blood cultures negative She was treated with IV antibiotics -> doxy and has ongoing improvement Will dc to home with PO doxy Needs surgery clinic f/u for hidradentitis   Polysubstance abuse Acknowledges ongoing use of cocaine Cessation encouraged   PAF  Rate controlled on carvedilol  Resume Xarelto   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 Diabetes coordinator   Procedures: None   Antibiotics: Ceftriaxone  x 2 Doxycycline  7/20- Vancomycin  x 1    Pain control - [place]  Controlled Substance Reporting System database was reviewed. and patient was instructed, not to drive, operate heavy machinery, perform activities at heights, swimming or participation in water  activities or provide baby-sitting services while on Pain, Sleep and Anxiety Medications; until their outpatient Physician has advised to do so again. Also recommended to not to take more than  prescribed Pain, Sleep and Anxiety Medications.   Disposition: Home Diet recommendation:  Carb modified diet DISCHARGE MEDICATION: Allergies as of 02/08/2024       Reactions   Glipizide    Other  reaction(s): Other (See Comments)   Metformin Diarrhea   Levaquin  [levofloxacin  In D5w] Itching   Penicillins         Medication List     STOP taking these medications    gabapentin  300 MG capsule Commonly known as: NEURONTIN    multivitamin with minerals Tabs tablet   Vitamin D  125 MCG (5000 UT) Caps       TAKE these medications    carvedilol  3.125 MG tablet Commonly known as: COREG  Take 1 tablet (3.125 mg total) by mouth 2 (two) times daily with a meal.   doxycycline  100 MG tablet Commonly known as: VIBRA -TABS Take 1 tablet (100 mg total) by mouth every 12 (twelve) hours for 10 days.   FLUoxetine  40 MG capsule Commonly known as: PROZAC  Take 1 capsule (40 mg total) by mouth daily.   insulin  glargine 100 UNIT/ML injection Commonly known as: LANTUS  Inject 0.4 mLs (40 Units total) into the skin daily.   Jardiance  25 MG Tabs tablet Generic drug: empagliflozin  Take 1 tablet (25 mg total) by mouth daily.   NovoLOG  FlexPen 100 UNIT/ML FlexPen Generic drug: insulin  aspart Inject 5 Units into the skin 3 (three) times daily with meals.   omeprazole 20 MG capsule Commonly known as: PRILOSEC Take 20 mg by mouth daily.   rivaroxaban  20 MG Tabs tablet Commonly known as: XARELTO  Take 1 tablet (20 mg total) by mouth daily with supper.        Discharge Exam:   Subjective: Feeling better.  Reports that she rents a walk-in closet in a house for $400 a month.  Needs assistance with medications.  Skin lesions are improving.  Wants to go home today.   Objective: Vitals:   02/08/24 0334 02/08/24 0741  BP: 133/68 (!) 144/83  Pulse: 75 77  Resp: 18 16  Temp: 98.7 F (37.1 C) 98.6 F (37 C)  SpO2: 97% 98%    Intake/Output Summary (Last 24 hours) at 02/08/2024 1220 Last data filed at 02/08/2024 0900 Gross per 24 hour  Intake 1850 ml  Output 400 ml  Net 1450 ml   Filed Weights   02/05/24 2158 02/06/24 0500 02/07/24 0407  Weight: 50.3 kg 49.8 kg 50 kg     Exam:  General:  Appears calm and comfortable and is in NAD, frail, chronically ill Eyes:  normal lids, iris ENT:  grossly normal hearing, lips & tongue, mmm Cardiovascular:  RRR. No LE edema.  Respiratory:   CTA bilaterally with no wheezes/rales/rhonchi.  Normal respiratory effort. Abdomen:  soft, NT, ND Skin:  R medial upper eyelid lesion is improved compared to pictures; L axillary hidradenitis with improving erythema and fluctuance Musculoskeletal:  grossly normal tone BUE/BLE, good ROM, no bony abnormality Psychiatric:  grossly normal mood and affect, speech fluent and appropriate, AOx3 Neurologic:  CN 2-12 grossly intact, moves all extremities in coordinated fashion  Data Reviewed: I have reviewed the patient's lab results since admission.  Pertinent labs for today include:   Na++ 127, corrected for glucose to 131-133 Glucose 360 Phos 1.7 WBC 7.2 Hgb 11.1    Condition at discharge: improving  The results of significant diagnostics from this hospitalization (including imaging, microbiology, ancillary and laboratory) are listed below for reference.   Imaging Studies: DG Chest 1 View Result Date: 02/05/2024  CLINICAL DATA:  Hyperglycemia. EXAM: CHEST  1 VIEW COMPARISON:  August 15, 2023 FINDINGS: The heart size and mediastinal contours are within normal limits. Mild chronic appearing reticular changes are noted. There is no evidence of acute infiltrate, pleural effusion or pneumothorax. A chronic deformity of the mid left clavicle is noted. Chronic fourth and fifth right rib deformities are seen. The visualized skeletal structures are otherwise unremarkable. IMPRESSION: No active cardiopulmonary disease. Electronically Signed   By: Suzen Dials M.D.   On: 02/05/2024 22:12    Microbiology: Results for orders placed or performed during the hospital encounter of 02/05/24  Culture, blood (Routine X 2) w Reflex to ID Panel     Status: None (Preliminary result)    Collection Time: 02/06/24  4:23 AM   Specimen: BLOOD  Result Value Ref Range Status   Specimen Description BLOOD BLOOD LEFT HAND  Final   Special Requests   Final    BOTTLES DRAWN AEROBIC AND ANAEROBIC Blood Culture results may not be optimal due to an inadequate volume of blood received in culture bottles   Culture   Final    NO GROWTH 2 DAYS Performed at [facility], [address], [place], [place] [address]    Report Status PENDING  Incomplete  Culture, blood (Routine X 2) w Reflex to ID Panel     Status: None (Preliminary result)   Collection Time: 02/06/24  4:23 AM   Specimen: BLOOD  Result Value Ref Range Status   Specimen Description BLOOD BLOOD LEFT ARM  Final   Special Requests   Final    BOTTLES DRAWN AEROBIC AND ANAEROBIC Blood Culture results may not be optimal due to an inadequate volume of blood received in culture bottles   Culture   Final    NO GROWTH 2 DAYS Performed at [facility], [address] [place], [place] [address]    Report Status PENDING  Incomplete  MRSA Next Gen by PCR, Nasal     Status: Abnormal   Collection Time: 02/06/24  4:57 AM   Specimen: Nasal Mucosa; Nasal Swab  Result Value Ref Range Status   MRSA by PCR Next Gen DETECTED (A) NOT DETECTED Final    Comment: RESULT CALLED TO, READ BACK BY AND VERIFIED WITH: MYRA FLOWERS ON 02/06/24 AT 0737 QSD (NOTE) The GeneXpert MRSA Assay (FDA approved for NASAL specimens only), is one component of a comprehensive MRSA colonization surveillance program. It is not intended to diagnose MRSA infection nor to guide or monitor treatment for MRSA infections. Test performance is not FDA approved in patients less than 34 years old. Performed at [facility], [address] [place], [place] [address]   C Difficile Quick Screen w PCR reflex     Status: None   Collection Time: 02/06/24  2:45 PM   Specimen: Stool  Result Value Ref Range Status   C Diff antigen NEGATIVE NEGATIVE  Final   C Diff toxin NEGATIVE NEGATIVE Final   C Diff interpretation No C. difficile detected.  Final    Comment: Performed at [facility], [address] [place], [place] [address]  Gastrointestinal Panel by PCR , Stool     Status: None   Collection Time: 02/06/24  2:45 PM   Specimen: Stool  Result Value Ref Range Status   Campylobacter species NOT DETECTED NOT DETECTED Final   Plesimonas shigelloides NOT DETECTED NOT DETECTED Final   Salmonella species NOT DETECTED NOT DETECTED Final   Yersinia enterocolitica NOT  DETECTED NOT DETECTED Final   Vibrio species NOT DETECTED NOT DETECTED Final   Vibrio cholerae NOT DETECTED NOT DETECTED Final   Enteroaggregative E coli (EAEC) NOT DETECTED NOT DETECTED Final   Enteropathogenic E coli (EPEC) NOT DETECTED NOT DETECTED Final   Enterotoxigenic E coli (ETEC) NOT DETECTED NOT DETECTED Final   Shiga like toxin producing E coli (STEC) NOT DETECTED NOT DETECTED Final   Shigella/Enteroinvasive E coli (EIEC) NOT DETECTED NOT DETECTED Final   Cryptosporidium NOT DETECTED NOT DETECTED Final   Cyclospora cayetanensis NOT DETECTED NOT DETECTED Final   Entamoeba histolytica NOT DETECTED NOT DETECTED Final   Giardia lamblia NOT DETECTED NOT DETECTED Final   Adenovirus F40/41 NOT DETECTED NOT DETECTED Final   Astrovirus NOT DETECTED NOT DETECTED Final   Norovirus GI/GII NOT DETECTED NOT DETECTED Final   Rotavirus A NOT DETECTED NOT DETECTED Final   Sapovirus (I, II, IV, and V) NOT DETECTED NOT DETECTED Final    Comment: Performed at [facility], [address], [place], [place] [address]  Urine Culture     Status: None (Preliminary result)   Collection Time: 02/06/24  5:58 PM   Specimen: Urine, Random  Result Value Ref Range Status   Specimen Description   Final    URINE, RANDOM Performed at [facility], [address], [place], [place] [address]    Special Requests   Final    NONE Reflexed from [id number] Performed at  [facility], [address], [place], [place] [address]    Culture   Final    CULTURE REINCUBATED FOR BETTER GROWTH Performed at [facility], [address], [place], [place] [address]    Report Status PENDING  Incomplete    Labs: CBC: Recent Labs  Lab 02/05/24 2223 02/06/24 0722 02/07/24 0407 02/08/24 0501  WBC 17.2* 18.5* 18.0* 7.2  NEUTROABS 16.1*  --   --   --   HGB 13.5 11.8* 11.9* 11.1*  HCT 43.9 34.9* 34.2* 33.1*  MCV 97.3 88.4 87.5 88.5  PLT 367 351 328 275   Basic Metabolic Panel: Recent Labs  Lab 02/06/24 0423 02/06/24 0722 02/06/24 1115 02/06/24 1541 02/06/24 1925 02/07/24 0406 02/07/24 0407 02/08/24 0501  NA 125*   < > 132* 133* 134* 129*  --  127*  K 4.5   < > 3.7 3.8 4.1 4.3  --  4.6  CL 89*   < > 99 101 102 98  --  94*  CO2 10*   < > 25 22 23  19*  --  26  GLUCOSE 631*   < > 198* 137* 111* 271*  --  360*  BUN 71*   < > 55* 49* 47* 37*  --  21  CREATININE 1.49*   < > 1.09* 1.03* 1.06* 0.89  --  0.59  CALCIUM  8.6*   < > 7.9* 7.8* 7.8* 8.0*  --  8.4*  MG 2.4  --   --   --   --   --  2.4 2.2  PHOS  --   --  2.9  --   --   --   --  1.7*   < > = values in this interval not displayed.   Liver Function Tests: Recent Labs  Lab 02/05/24 2337  AST 18  ALT 12  ALKPHOS 103  BILITOT 2.0*  PROT 7.5  ALBUMIN 3.0*   CBG: Recent Labs  Lab 02/07/24 1628 02/07/24 1856 02/07/24 2056 02/08/24 0743 02/08/24 1208  GLUCAP 238* 185* 266* 317* 150*    Discharge time spent: greater than 30 minutes.  Signed: Delon Herald, MD [facility] 02/08/2024

## 2024-02-08 NOTE — Hospital Course (Addendum)
 63yo with h/o substance use d/o, anxiety/depression, afib on Xarelto , HTN, and uncontrolled DM with recurrent DKA who presented on 7/18 with DKA.  She was also diagnosed with sepsis due to skin infection and started on antibiotics with improvement.

## 2024-02-08 NOTE — Inpatient Diabetes Management (Addendum)
 Inpatient Diabetes Program Recommendations  AACE/ADA: New Consensus Statement on Inpatient Glycemic Control (2015)  Target Ranges:  Prepandial:   less than 140 mg/dL      Peak postprandial:   less than 180 mg/dL (1-2 hours)      Critically ill patients:  140 - 180 mg/dL   Lab Results  Component Value Date   GLUCAP 317 (H) 02/08/2024   HGBA1C 9.8 (H) 08/16/2023    Review of Glycemic Control  Latest Reference Range & Units 02/07/24 11:58 02/07/24 16:28 02/07/24 18:56 02/07/24 20:56 02/08/24 07:43  Glucose-Capillary 70 - 99 mg/dL 701 (H) 761 (H) 814 (H) 266 (H) 317 (H)   Diabetes history: DM 2 Outpatient Diabetes medications: Lantus  40 units daily, Jardiance  25 mg daily, Novolog  5 units tid with meals  Current orders for Inpatient glycemic control:  Novolog  0-15 units tid with meals and HS Semglee  30 units daily  Inpatient Diabetes Program Recommendations:    Please consider increasing Semglee  to 35 units daily and also consider adding Novolog  4 units tid with meals (hold if patient eats less than 50% or NPO).   Addendum 1500- Spoke to patient and she admits that she ran out of her insulin  and could not afford the $3 co-pay.  She now has medications. I encouraged her to contact medicaid regarding difficulty paying co-pays.  She states she has meter and strips at home.  Briefly discussed A1C from January of 2025 of 9.8%.  Encouraged close follow-up.     Thanks,  Randall Bullocks, RN, BC-ADM Inpatient Diabetes Coordinator Pager [phone]  (8a-5p)

## 2024-02-08 NOTE — Plan of Care (Signed)
   Problem: Education: Goal: Knowledge of General Education information will improve Description: Including pain rating scale, medication(s)/side effects and non-pharmacologic comfort measures Outcome: Progressing   Problem: Nutrition: Goal: Adequate nutrition will be maintained Outcome: Progressing   Problem: Safety: Goal: Ability to remain free from injury will improve Outcome: Progressing

## 2024-02-08 NOTE — Discharge Instructions (Signed)
 Food Resources  Agency Name: [facility] Agency Address: [address] [place], [place] [address] Phone: [phone] Website: [email or web address] Service(s) Offered: Housing services, self-sufficiency, congregate meal program, weatherization program, Event organiser program, emergency food assistance,  housing counseling, home ownership program, wheels - to work program.  [employer] free for 60 and older at various locations from [employer], Monday-Friday:  [employer], [address] [place], [phone] -[facility], [address], [place] [phone]  -[facility], [address], [place] [phone]  -[address] [place], [phone]  Agency Name: [facility] Address: [phone] [address], [place], [place] [address] Phone: [phone] Website: [email or web address] Service(s) Offered: Home delivered hot, frozen, and emergency  meals. Grocery assistance program which matches  volunteers one-on-one with seniors unable to grocery shop  for themselves. Must be 60 years and older; less than 20  hours of in-home aide service, limited or no driving ability;  live alone or with someone with a disability; live in  [place].  Agency Name: Ecologist [facility]) Address: [address], [place], [place] [address] Phone: [phone] Service(s) Offered: Food is served to shut-ins, homeless, elderly, and low income people in the community every Saturday (11:30 am-12:30 pm) and Sunday (12:30 pm-1:30pm). Volunteers also offer help and encouragement in seeking employment,  and spiritual guidance.  Agency Name: Department of Social Services Address: [address] [place], [place] [address] Phone: [phone] Service(s) Offered: Child support services; child welfare services; food stamps; Medicaid; work first family assistance; and aid  with fuel,  rent, food and medicine.  Agency Name: Dietitian Address: [address] [place], [place] Phone: [phone] Website: [email or web address] Services Offered: Monday 10:00am-12:00, 8:00pm-9:00pm, and Friday 10:00am-12:00.  Agency Name: [facility] [place] Address: [address] [name], [place] [address] Phone: [phone] Website: www.[email or web address] Service(s) Offered: Serves weekday meals, open from 11:30 am- 1:00 pm., and 6:30-7:30pm, Monday-Wednesday-Friday distributes food 3:30-6pm, Monday-Wednesday-Friday.  Agency Name: [facility] Address: [address] [place], [place] Phone: [phone] Website: [email or web address] Services Offered: Distributes food the 4th Saturday of the month, starting at 8:00 am  Agency Name: [facility] Address: [phone] [address] [place], [place] [address] Phone: [phone] Website: [email or web address] Service(s) Offered: Bread of life, weekly food pantry. Open Wednesdays from 10:00am-noon.  Agency Name: [facility] [employer] [facility] Address: [address] [place], [place], [place] Phone: [phone] Services Offered: Distributes food 9am-1pm, Monday-Thursday. Call for details.  Agency Name: [facility] Address: [address], [place], [place] [address] Phone: [phone] Website: [email or web address] Service(s) Offered: Games developer. Call for assistance.  Agency Name: [facility] Address: [address] [place], [place] [address] Phone: [phone] Service Offered: Emergency Food Pantry. Call for appointment.  Agency Name: [facility] Address: [address], [place], [place] [address] Phone: [phone] Website: [email or web address] Services Offered: Games developer. Call for details  Agency Name: [facility] Address: [address] [place], [place] Phone:  [phone] Website: [email or web address] Service(s) Offered: Emergency Food Pantry. Call for details.  Agency Name: Holiday representative Address: [address] [place], [place] [address] Phone: [phone] or [phone] Website: [email or web address] Service(s) Offered: Distribute food 9am-11:30 am, Tuesday-Friday, and 1-3:30pm, Monday-Friday. Food pantry Monday-Friday 1pm-3pm, fresh items, Mon.-Wed.-Fri.  Agency Name: [facility] Address: [address] [place], [place] [address] Phone: [phone] Website: [email or web address] Services Offered: Distribute food Tues and Sats from 9:00am-noon.  Closed 1st Saturday of each month. Call for details  Agency Name: [employer] Address: Fayrene Boatman [facility] [address], [place] [address] Phone: [phone]  Services Offered: Delivers meals every Thursday   Rent/Utility/Housing  Agency Name: [facility] Agency Address: [address] [place], [place] [address] Phone: [phone] Email: [email or web address] Website: [email or web address] Service(s) Offered: Housing services, self-sufficiency, congregate meal program, weatherization program, Field seismologist program, emergency food assistance,  housing counseling, home ownership program, wheels -towork program.  Agency Name: [facility] Address: [address] [place], [place] [address] Phone: [phone] (8a-4p) [phone] (8p- 10p) Email: [email or web address] Website: [email or web address] Service(s) Offered: A program for homeless and/or needy men that includes one-on-one counseling, life skills training and job rehabilitation.  Agency Name: [facility] [place] Address: [address], [place], [place] [address] Phone: [phone] Website: www.[email or web address] Service(s) Offered: Assistance to needy in emergency with  utility bills, heating fuel, and prescriptions. Shelter for homeless 7pm-7am. November 13, 2016 15  Agency Name: [facility] [place] [facility]Development[facility] Disabled) Address: [address], [place], [place] [address] Phone: [phone] Contact Person: Lemond Cart Email: [email or web address] Website: [email or web address] Service(s) Offered: Helps individuals with developmental disabilities move from housing that is more restrictive to homes where they  can achieve greater independence and have more  opportunities.  Agency Name: [facility] Address: [address], [place], [place] [address] Phone: [phone] Email: [email or web address] Website: [email or web address].org Service(s) Offered: Provides affordable housing for low-income families, elderly, and disabled individuals. Offer a wide range of  programs and services, from financial planning to afterschool and summer programs.  Agency Name: Department of Social Services Address: [address] [place], [place] [address] Phone: [phone] Service(s) Offered: Child support services; child welfare services; food stamps; Medicaid; work first family assistance; and aid with fuel,  rent, food and medicine.  Agency Name: [facility] [place] [employer] Address: [facility] [address], [place], [place]  [address] Phone: [phone] Website: www.familyabuseservices.org Service(s) Offered: 24 hour Crisis Line: [phone]; 24 hour Emergency Shelter; Transitional Housing; Support Groups; Scientist, physiological; [employer]; Hispanic Outreach: [phone];  Visitation Center: [phone].  Agency Name: [facility] [place]. Address: [address] [place], [place] [address] Phone: [phone] Service(s) Offered: CAP Services; Home and [employer]; Individual or Group Supports; Respite Care Non-Institutional Nursing;  Residential Supports; Respite Care and Personal Care  Services; Transportation; Family and Friends Night; Recreational Activities; Three Nutritious Meals/Snacks; Consultation with Registered Dietician; Twenty-four hour Registered Nurse Access; Daily and [employer]; Camp Green Leaves; [place] for the [employer] (During Summer Months) Bingo Night (Every  Wednesday Night); Special Populations Dance Night  (Every Tuesday Night); Professional Hair Care Services.  Agency Name: [facility] Address: [address], [place], [place] [address] Phone: [phone] Contact Person: [place] High Website: [email or web address].Physicist, medical) Offered: Residential treatment facility for women; food and  clothing, educational & employment development and  transportation to work; Counsellor of financial skills;  parenting and family reunification; emotional and spiritual  support; transitional housing for program graduates.  Agency Name: [facility] Address: [address], [place], [place] [address] Phone: [phone] Email: [email or web address] Website: [email or web address].es Service(s) Offered: Public housing units for elderly, disabled, and low income people; housing choice vouchers for income eligible  applicants; shelter plus care vouchers; and Psychologist, clinical.  Agency Name: [facility] [employer] Address: [address] [place], [place] [address] Phone: [phone] Email: [email or web address] Website: www.habitatalamance.org  Service(s) Offered: Build houses for families in need of decent housing. Each adult in the family must invest 200 hours of labor on  someone else's house, work with volunteers to build their own house, attend classes on budgeting, home maintenance, yard care, and attend homeowner association meetings.  Agency Name: [facility] Address: [address], [place], [place] [address] Phone: [phone] Website:  www.rsli.org Service(s) Offered: Intermediate care facilities for intellectually delayed, Supervised Living in group homes for adults with developmental disabilities, Supervised Living for people who have dual diagnoses (MRMI), Independent Living, Supported Living, respite and a variety of CAP services, pre-vocational services, day supports, and [employer].  Agency Name: [place] [facility] [facility] Phone: [phone] Website: www.NCForeclosurePrevention.gov Service(s) Offered: Zero-interest, deferred loans to homeowners struggling to pay their mortgage. Call for more information.     Transportation Resources for [employer]  Agency Name: [facility] Agency Address: [address] [place], [place] [address] Phone: [phone] Email: [email or web address] Website: www.[email or web address].org Service(s) Offered: Housing services, self-sufficiency, congregate meal program, weatherization program, Field seismologist program, emergency food assistance,  housing counseling, home ownership program, wheels-towork program.  Agency Name: [facility] [employer] [phone]) Address: [address], [place], [place] [address] Phone: [phone] Website: www.acta-[email or web address].com Service(s) Offered: Transportation for [employer], subscription and demand response; Dial-a-Ride for citizens 49 years of age or older.  Agency Name: Department of Social Services Address: [address] [place], [place] [address] Phone: [phone] Service(s) Offered: Child support services; child welfare services; food stamps; Medicaid; work first family assistance; and aid with fuel,  rent, food and medicine, transportation assistance.  Agency Name: Disabled [employer] (DAV) Transportation  Network Phone: [phone] Service(s) Offered: Transports veterans to the [facility] medical center. Call  forty-eight hours in  advance and leave the name, telephone  number, date, and time of appointment. Veteran will be  contacted by the driver the day before the appointment to  arrange a pick up point    [employer] ACTA currently provides door to door services. ACTA connects with PART daily for services to [facility]. ACTA also performs contract services to [employer] operates 27 vehicles, all but 3 mini-vans are equipped with lifts for special needs as well as the general public. ACTA drivers are each CDL certified and trained in First Aid and CPR. ACTA was established in 2002 by [employer]. An independent Industrial/product designer. ACTA operates via Cytogeneticist with required local 10% match funding from [employer]. ACTA provides over 80,000 passenger trips each year, including [facility] and [employer] sites.  Call at least by 11 AM one business day prior to needing transportation  [employer].                      [place], [place] [address]     Office Hours: Monday-Friday  8 AM - 5 PM   Agency Name: [facility] Agency Address: [address] [place], [place] [address] Phone: [phone] Website: www.alamanceservices.org Service(s) Offered: Housing services, self-sufficiency, congregate meal program, and individual development account program.  Agency Name: [facility] [place] Address: [address], [place], [place] [address] Phone: [phone] Email: [email or web address] Website: www.alliedchurches.org Service(s) Offered: Housing the homeless, feeding the hungry, Company secretary, job and education related services.  Agency Name: [employer] Address: [address], [place],  [place] [address] Phone: [phone] Email: [email or web address] Service(s) Offered: Counseling, problem pregnancy, advocacy  for Hispanics, limited emergency financial assistance.  Agency Name: Department of Social Services Address: [address] [place], [place] [address] Phone: [phone] Website: [email or web address] Service(s) Offered: Child support services; child welfare services; SNAP; Medicaid; work first family assistance; and aid with fuel,  rent, food and medicine.  Agency Name: Holiday representative Address: [address] [place], [place] [address] Phone: [phone] or [phone] Email: [email or web address] Service(s) Offered: Family services and transient assistance; emergency food, fuel, clothing, limited furniture, utilities; budget counseling, general counseling; give a kid a coat; thrift store; Christmas food and toys. Utility assistance, food pantry, rental  assistance, life sustaining medicine

## 2024-02-11 LAB — CULTURE, BLOOD (ROUTINE X 2)
Culture: NO GROWTH
Culture: NO GROWTH

## 2024-02-17 ENCOUNTER — Other Ambulatory Visit: Payer: Self-pay

## 2024-03-29 ENCOUNTER — Other Ambulatory Visit: Payer: Self-pay | Admitting: Cardiovascular Disease

## 2024-03-29 NOTE — Telephone Encounter (Signed)
 Prescription refill request for Xarelto  received.  Indication:afib Last office visit:needs appt Weight:50 kg Age:63 Scr:na CrCl:na  Prescription refilled

## 2024-03-30 NOTE — Telephone Encounter (Signed)
 Unable to contact no VM available

## 2024-05-13 ENCOUNTER — Other Ambulatory Visit: Payer: Self-pay | Admitting: Cardiovascular Disease

## 2024-05-13 DIAGNOSIS — I480 Paroxysmal atrial fibrillation: Secondary | ICD-10-CM

## 2024-05-13 NOTE — Telephone Encounter (Addendum)
 Xarelto  20mg  refill request received. Pt is 63 years old, weight-50kg, Crea-0.59 on 02/08/24, last seen by on Dr. Gollan on 01/30/23-pt needs an appointment, Diagnosis-Afib, CrCl- 75.04 mL/min; Dose is appropriate based on dosing criteria.   Messages sent to Schedulers  Pt has an appt on 07/04/24, will send in refill at this time.

## 2024-05-13 NOTE — Telephone Encounter (Signed)
 Refill Request.

## 2024-05-16 MED ORDER — RIVAROXABAN 20 MG PO TABS
20.0000 mg | ORAL_TABLET | Freq: Every day | ORAL | 0 refills | Status: AC
Start: 2024-05-16 — End: ?

## 2024-06-24 ENCOUNTER — Ambulatory Visit: Payer: Self-pay

## 2024-06-24 NOTE — Telephone Encounter (Signed)
    Copied from CRM [id number]. Topic: Appointments - Appointment Scheduling >> Jun 24, 2024 10:33 AM Tonda B wrote: Patient/patient representative is calling to schedule an appointment. Refer to attachments for appointment information.  Patient is calling in she has pain and swelling her left foot Answer Assessment - Initial Assessment Questions 1. REASON FOR CALL: What is the main reason for your call? or How can I best help you? Calling to establish care with Jolene Cannady at [facility]. Also having foot pain and swelling. Declined triage, plans to proceed to urgent care for acute symptoms.  New patient appointment scheduled.  Protocols used: Information Only Call - No Triage-A-AH

## 2024-07-01 NOTE — Progress Notes (Deleted)
 Date:  07/01/2024   ID:  Devere Hales, DOB 22-Feb-1961, MRN [id number]  Patient Location:  [address] [place] [address]   Provider location:   [facility], [place] office  PCP:  Era Raisin, NP  Cardiologist:  [facility]  No chief complaint on file.   History of Present Illness:    Michaela Johnson is a 63 y.o. female past medical history of paroxysmal atrial fibrillation, August 2019 insulin -dependent diabetes,  Recurrent hospitalizations for DKA sunstance abuse including cocaine, alcohol  abuse abuse neuropathy,   [facility] 01/2018 with DKA, nausea vomiting potassium 7.1 sodium level 123 Syncope in the setting of vasovagal episodes EF 55 to 60% in 6/23 Who presents to follow-up with her atrial fibrillation, syncope/vasovagal episodes  Last seen by myself in clinic 7/24   Hospitalized November 2023, drug abuse, severe acidemia, DKA Left AMA admitted to ICU with acute toxic metabolic encephalopathy; hypotension requiring Levophed  gtt and DKA requiring insulin  gtt.  Developed iatrogenic right apical pneumothorax in the setting of attempted right subclavian central line  Cr 1.91 on presentation.  AKI resolved after IVF  Potassium 6.2 on presentation   A1c 11.7 in nov 2023 No recent lab work She reports that she has not had follow-up with Carlin Blamer, declining lab work  Denies palpitations Did not bring a list of medications with her today Has been out of medications she reports past several days including her diltiazem  Blood pressure low today 90/50, confirmed by myself  Denies any orthostasis symptoms  Weight down from 138 pounds on last visit with me down to 112 today Eating TV dinners, separated from her husband so eating less  Denies chest pain concerning for angina  Lots of chest cramping in the exam room today, trouble getting comfortable  EKG personally reviewed by myself on todays visit Normal sinus rhythm  rate 83 bpm intraventricular conduction delay, PVCs  Other past medical history reviewed in the hospital end of September into beginning of October 2022 for nausea and vomiting.  serum glucose 534, anion gap 28, beta hydroxybutyrate >8, ketonuria, and pH 7.18 on vBG consistent with DKA.  -On discussion today she is unsure how her sugars got so high  Other past medical history reviewed 01/2019 seen in the ER  diarrhea, bleeding, weak Syncope sitting on the commode [place] bathroom, wrist injury On eliquis   7/12 to 7./14 emesis, hyperkalemia,DKA, atrial fibrillation 7/17 to 7/28  Weakness emesis, hyperkalemia,DKA 8/4 to 8/5, PAF, emisis, hyperkalemia,DKA, treated with amiodaronetreated with amiodarone , eliquis    In follow-up today she reports having diarrhea one week ago, She thinks it was from drinking a Glucerna Felt dizzy while having the diarrhea in the bathroom, developed syncope Did not go to the hospital Hit the side of her face on the left    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 07/30/2020   Cocaine abuse (HCC)    Constipation    COPD (chronic obstructive pulmonary disease) (HCC)    Depression    DKA (diabetic ketoacidosis) (HCC) 08/15/2023   GERD (gastroesophageal reflux disease)    Hypertension    Neuropathy    Nicotine  dependence 07/31/2020   PAF (paroxysmal atrial fibrillation) (HCC)    Panic attacks    Pneumonia  Tobacco use    Vitamin D  deficiency    Past Surgical History:  Procedure Laterality Date   ANKLE FUSION Right 07/30/2020   Procedure: ANKLE FUSION;  Surgeon: Celena Sharper, MD;  Location: [facility] OR;  Service: Orthopedics;  Laterality: Right;   APPLICATION OF WOUND VAAPPLICATION OF WOUND VAC (Right Ankle)C (Right Ankle)  07/31/2020   APPLICATION OF WOUND VAC Right  07/30/2020   Procedure: APPLICATION OF WOUND VAC;  Surgeon: Celena Sharper, MD;  Location: [facility] OR;  Service: Orthopedics;  Laterality: Right;   HARDWARE REMOVAL Right 07/30/2020   Procedure: HARDWARE REMOVAL RIGHT ANKLE;  Surgeon: Celena Sharper, MD;  Location: [facility] OR;  Service: Orthopedics;  Laterality: Right;   HARDWARE REMOVAL RIGHT ANKLE (Right Ankle  07/31/2020   INCISION AND DRAINAGE ABSCESS N/A 06/15/2023   Procedure: INCISION AND DRAINAGE NASAL ABSCESS;  Surgeon: Herminio Miu, MD;  Location: [facility] ORS;  Service: ENT;  Laterality: N/A;   INTRAMEDULLARY (IM) NAIL INTERTROCHANTERIC Right 01/04/2022   Procedure: INTRAMEDULLARY (IM) NAIL INTERTROCHANTRIC;  Surgeon: Edie Norleen PARAS, MD;  Location: [facility] ORS;  Service: Orthopedics;  Laterality: Right;   ORIF ANKLE FRACTURE Right 07/04/2020   Procedure: OPEN REDUCTION INTERNAL FIXATION (ORIF) ANKLE FRACTURE;  Surgeon: Leora Lynwood SAUNDERS, MD;  Location: [facility] ORS;  Service: Orthopedics;  Laterality: Right;   TEE WITHOUT CARDIOVERSION N/A 08/19/2023   Procedure: TRANSESOPHAGEAL ECHOCARDIOGRAM (TEE);  Surgeon: Perla Evalene PARAS, MD;  Location: [facility] ORS;  Service: Cardiovascular;  Laterality: N/A;     Allergies:   Glipizide, Metformin, Levaquin  [levofloxacin  in d5w], and Penicillins   Social History   Tobacco Use   Smoking status: Every Day    Current packs/day: 1.00    Average packs/day: 1 pack/day for 41.0 years (41.0 ttl pk-yrs)    Types: Cigarettes, E-cigarettes, Cigars   Smokeless tobacco: Never  Vaping Use   Vaping status: Never Used  Substance Use Topics   Alcohol  use: Not Currently    Alcohol /week: 6.0 standard drinks of alcohol     Types: 6 Standard drinks or equivalent per week    Comment: last use yesterday x 1 beer   Drug use: Yes    Frequency: 7.0 times per week    Types: Marijuana, Cocaine    Comment: 3 days every week      Outpatient Encounter Medications as of 07/04/2024  Medication Sig   carvedilol  (COREG ) 3.125 MG tablet  Take 1 tablet (3.125 mg total) by mouth 2 (two) times daily with a meal.   FLUoxetine  (PROZAC ) 40 MG capsule Take 1 capsule (40 mg total) by mouth daily.   insulin  glargine (LANTUS ) 100 UNIT/ML Solostar Pen Inject 40 Units into the skin daily.   Insulin  Pen Needle 32G X 4 MM MISC Use 1 each as directed with insulin .   JARDIANCE  25 MG TABS tablet Take 1 tablet (25 mg total) by mouth daily.   NOVOLOG  FLEXPEN 100 UNIT/ML FlexPen Inject 5 Units into the skin 3 (three) times daily with meals.   omeprazole (PRILOSEC) 20 MG capsule Take 20 mg by mouth daily.   rivaroxaban  (XARELTO ) 20 MG TABS tablet Take 1 tablet (20 mg total) by mouth daily with supper.   No facility-administered encounter medications on file as of 07/04/2024.     Family Hx: The patient's family history includes CAD in her father; Diabetes in her mother and paternal aunt; Heart Problems in her father; Heart failure in her father.  ROS:   Please see the history of present illness.    Review of  Systems  Constitutional: Negative.   HENT: Negative.    Respiratory: Negative.    Cardiovascular: Negative.   Gastrointestinal: Negative.   Musculoskeletal: Negative.   Neurological: Negative.   Psychiatric/Behavioral: Negative.    All other systems reviewed and are negative.    Labs/Other Tests and Data Reviewed:    Recent Labs: 02/05/2024: ALT 12 02/06/2024: TSH 0.547 02/08/2024: BUN 21; Creatinine, Ser 0.59; Hemoglobin 11.1; Magnesium  2.2; Platelets 275; Potassium 4.6; Sodium 127   Recent Lipid Panel Lab Results  Component Value Date/Time   CHOL 157 06/01/2022 09:04 AM   TRIG 106 01/30/2018 05:31 AM   HDL 73 01/30/2018 05:31 AM   CHOLHDL 1.9 01/30/2018 05:31 AM   LDLCALC 42 01/30/2018 05:31 AM    Wt Readings from Last 3 Encounters:  02/07/24 110 lb 3.7 oz (50 kg)  08/30/23 109 lb 2 oz (49.5 kg)  08/15/23 115 lb 4.8 oz (52.3 kg)     Exam:    Vital Signs:  There were no vitals taken for this visit.    Constitutional:  oriented to person, place, and time. No distress.  HENT:  Head: Grossly normal Eyes:  no discharge. No scleral icterus.  Neck: No JVD, no carotid bruits  Cardiovascular: Regular rate and rhythm, no murmurs appreciated Pulmonary/Chest: Clear to auscultation bilaterally, no wheezes or rails Abdominal: Soft.  no distension.  no tenderness.  Musculoskeletal: Normal range of motion Neurological:  normal muscle tone. Coordination normal. No atrophy Skin: Skin warm and dry Psychiatric: normal affect, pleasant   ASSESSMENT & PLAN:    Problem List Items Addressed This Visit   None  PAF (paroxysmal atrial fibrillation) (HCC) - Plan: EKG 12-Lead Elevated CHDS VASC : female, diabetic, vascular disease, hypertension Blood pressure very low declining lab work today.  Ideally needs CBC before refilling her Xarelto , this was expressed to her Recommend we stop diltiazem  ER 180, changed to metoprolol  succinate 25 daily Recommend she hydrate   Orthostasis As on prior clinic visit, blood pressure low likely exacerbated by continued weight loss Denies orthostasis symptoms Medication changes as above, stop diltiazem  Recommend hydrate   Diabetic ketoacidosis without coma associated with other specified diabetes mellitus (HCC) - Numerous hospital admissions for high sugars, DKA Prior admission for vomiting, glucose levels 500 We will suggest lab work today, so far has declined Recommend follow-up with Carlin Blamer   Substance abuse (HCC) cessation from any alcohol , cocaine  Insulin  dependent diabetes mellitus (HCC) Managed by primary care, has not had close follow-up with Carlin Blamer Recent hospital admission for sugars of 500, nausea vomiting  Syncope Prior episodes, none recently  1 episode in the setting of diarrhea, vasovagal response Low blood pressure today likely exacerbated by continued weight loss,  medication changes as above   Total encounter time more than  40 minutes  Greater than 50% was spent in counseling and coordination of care with the patient      Signed, Issa Kosmicki, MD  [facility] [address], [place], [place] [address]

## 2024-07-03 ENCOUNTER — Other Ambulatory Visit: Payer: Self-pay | Admitting: Cardiovascular Disease

## 2024-07-03 DIAGNOSIS — I480 Paroxysmal atrial fibrillation: Secondary | ICD-10-CM

## 2024-07-04 ENCOUNTER — Ambulatory Visit: Attending: Cardiovascular Disease | Admitting: Cardiovascular Disease

## 2024-07-04 DIAGNOSIS — E1110 Type 2 diabetes mellitus with ketoacidosis without coma: Secondary | ICD-10-CM

## 2024-07-04 DIAGNOSIS — I480 Paroxysmal atrial fibrillation: Secondary | ICD-10-CM

## 2024-07-04 DIAGNOSIS — I10 Essential (primary) hypertension: Secondary | ICD-10-CM

## 2024-07-04 DIAGNOSIS — E1165 Type 2 diabetes mellitus with hyperglycemia: Secondary | ICD-10-CM

## 2024-07-04 DIAGNOSIS — Z720 Tobacco use: Secondary | ICD-10-CM

## 2024-07-04 NOTE — Telephone Encounter (Signed)
 Pt last saw Dr Gollan 01/30/23, pt is overdue for follow-up.  Pt has appt today 07/04/24 with Dr Gollan.  Last labs 02/08/24 Creat 0.59, age 63, weight 50kg, CrCl 75.04, based on CrCl pt is on appropriate dosage of Xarelto  20mg  every day for afib.  Will await appt to refill rx today.

## 2024-07-05 NOTE — Telephone Encounter (Signed)
 Pt did not show up for follow-up appt with Dr Gollan on 07/04/24, pt is overdue for follow-up and is aware needed appt for future refills.  Will deny refill with note pt needs to contact office for appointment.

## 2024-07-07 ENCOUNTER — Telehealth: Payer: Self-pay | Admitting: Cardiovascular Disease

## 2024-07-07 NOTE — Telephone Encounter (Signed)
-----   Message from Nurse Bernice NOVAK, RN sent at 07/05/2024  7:50 AM EST ----- Pt last saw Dr Perla July 2024, pt was scheduled to see Dr Perla yesterday, but it appears she did not show up for her appt.  Pt is requesting refills, pt needs to be seen for refills.  Please contact pt to schedule follow-up appt. Thanks

## 2024-07-07 NOTE — Telephone Encounter (Signed)
 Left voice mail

## 2024-09-09 ENCOUNTER — Ambulatory Visit: Admitting: Nurse Practitioner
